# Patient Record
Sex: Female | Born: 1960 | Race: White | Hispanic: No | Marital: Married | Smoking: Former smoker
Health system: Southern US, Community
[De-identification: ages and names within clinical notes are randomized; demographics above are authoritative.]

## PROBLEM LIST (undated history)

## (undated) DIAGNOSIS — E785 Hyperlipidemia, unspecified: Secondary | ICD-10-CM

## (undated) DIAGNOSIS — Z789 Other specified health status: Secondary | ICD-10-CM

## (undated) DIAGNOSIS — I10 Essential (primary) hypertension: Secondary | ICD-10-CM

## (undated) DIAGNOSIS — F419 Anxiety disorder, unspecified: Secondary | ICD-10-CM

## (undated) DIAGNOSIS — D696 Thrombocytopenia, unspecified: Secondary | ICD-10-CM

## (undated) DIAGNOSIS — C801 Malignant (primary) neoplasm, unspecified: Secondary | ICD-10-CM

## (undated) DIAGNOSIS — E119 Type 2 diabetes mellitus without complications: Secondary | ICD-10-CM

## (undated) DIAGNOSIS — F32A Depression, unspecified: Secondary | ICD-10-CM

## (undated) HISTORY — PX: MASTECTOMY: SHX3

## (undated) HISTORY — DX: Hyperlipidemia, unspecified: E78.5

## (undated) HISTORY — PX: NO PAST SURGERIES: SHX2092

## (undated) HISTORY — PX: ABDOMINAL HYSTERECTOMY: SHX81

## (undated) HISTORY — DX: Malignant (primary) neoplasm, unspecified: C80.1

---

## 1998-03-31 DIAGNOSIS — Z8639 Personal history of other endocrine, nutritional and metabolic disease: Secondary | ICD-10-CM

## 1998-03-31 HISTORY — DX: Personal history of other endocrine, nutritional and metabolic disease: Z86.39

## 2009-03-23 ENCOUNTER — Emergency Department: Payer: Self-pay | Admitting: Emergency Medicine

## 2009-09-19 ENCOUNTER — Ambulatory Visit: Payer: Self-pay | Admitting: Family Medicine

## 2010-10-24 ENCOUNTER — Ambulatory Visit: Payer: Self-pay | Admitting: Family Medicine

## 2010-11-04 ENCOUNTER — Ambulatory Visit: Payer: Self-pay | Admitting: Family Medicine

## 2011-07-23 ENCOUNTER — Emergency Department: Payer: Self-pay

## 2012-01-08 ENCOUNTER — Ambulatory Visit: Payer: Self-pay | Admitting: Family Medicine

## 2013-01-28 ENCOUNTER — Emergency Department: Payer: Self-pay | Admitting: Emergency Medicine

## 2013-01-28 ENCOUNTER — Ambulatory Visit: Payer: Self-pay | Admitting: Family Medicine

## 2013-01-28 LAB — URINALYSIS, COMPLETE
Bacteria: NONE SEEN
Bilirubin,UR: NEGATIVE
Glucose,UR: NEGATIVE mg/dL (ref 0–75)
Hyaline Cast: 6
Ketone: NEGATIVE
Leukocyte Esterase: NEGATIVE
Nitrite: NEGATIVE
Protein: NEGATIVE

## 2013-01-28 LAB — HEPATIC FUNCTION PANEL A (ARMC)
Albumin: 3.6 g/dL (ref 3.4–5.0)
Alkaline Phosphatase: 79 U/L (ref 50–136)
Bilirubin, Direct: <0.1 mg/dL (ref 0.00–0.20)
SGOT(AST): 23 U/L (ref 15–37)
SGPT (ALT): 21 U/L (ref 12–78)
Total Protein: 7.1 g/dL (ref 6.4–8.2)

## 2013-01-28 LAB — BASIC METABOLIC PANEL
Anion Gap: 4 — ABNORMAL LOW (ref 7–16)
BUN: 10 mg/dL (ref 7–18)
Calcium, Total: 8.9 mg/dL (ref 8.5–10.1)
Chloride: 108 mmol/L — ABNORMAL HIGH (ref 98–107)
Creatinine: 0.75 mg/dL (ref 0.60–1.30)
EGFR (African American): >60
EGFR (Non-African Amer.): >60
Osmolality: 278 (ref 275–301)
Sodium: 139 mmol/L (ref 136–145)

## 2013-01-28 LAB — CBC
HCT: 38.9 % (ref 35.0–47.0)
HGB: 13.6 g/dL (ref 12.0–16.0)
MCHC: 34.9 g/dL (ref 32.0–36.0)
MCV: 91 fL (ref 80–100)
Platelet: 217 10*3/uL (ref 150–440)
RDW: 13 % (ref 11.5–14.5)
WBC: 8.1 10*3/uL (ref 3.6–11.0)

## 2013-10-07 ENCOUNTER — Emergency Department: Payer: Self-pay | Admitting: Emergency Medicine

## 2013-10-07 LAB — URINALYSIS, COMPLETE
BILIRUBIN, UR: NEGATIVE
Glucose,UR: NEGATIVE mg/dL (ref 0–75)
Nitrite: NEGATIVE
Ph: 5 (ref 4.5–8.0)
Protein: 30
RBC,UR: 48 /HPF (ref 0–5)
SPECIFIC GRAVITY: 1.025 (ref 1.003–1.030)
Squamous Epithelial: 8
WBC UR: 17 /HPF (ref 0–5)

## 2013-10-07 LAB — COMPREHENSIVE METABOLIC PANEL
AST: 16 U/L (ref 15–37)
Albumin: 3.6 g/dL (ref 3.4–5.0)
Alkaline Phosphatase: 67 U/L
Anion Gap: 7 (ref 7–16)
BILIRUBIN TOTAL: 0.4 mg/dL (ref 0.2–1.0)
BUN: 9 mg/dL (ref 7–18)
CALCIUM: 8.7 mg/dL (ref 8.5–10.1)
Chloride: 105 mmol/L (ref 98–107)
Co2: 29 mmol/L (ref 21–32)
Creatinine: 0.74 mg/dL (ref 0.60–1.30)
EGFR (African American): >60
EGFR (Non-African Amer.): >60
Glucose: 100 mg/dL — ABNORMAL HIGH (ref 65–99)
Osmolality: 280 (ref 275–301)
Potassium: 3.2 mmol/L — ABNORMAL LOW (ref 3.5–5.1)
SGPT (ALT): 17 U/L (ref 12–78)
Sodium: 141 mmol/L (ref 136–145)
Total Protein: 7.2 g/dL (ref 6.4–8.2)

## 2013-10-07 LAB — CBC
HCT: 40.2 % (ref 35.0–47.0)
HGB: 13.6 g/dL (ref 12.0–16.0)
MCH: 31.4 pg (ref 26.0–34.0)
MCHC: 33.9 g/dL (ref 32.0–36.0)
MCV: 93 fL (ref 80–100)
Platelet: 228 10*3/uL (ref 150–440)
RBC: 4.34 10*6/uL (ref 3.80–5.20)
RDW: 13.2 % (ref 11.5–14.5)
WBC: 9 10*3/uL (ref 3.6–11.0)

## 2015-03-24 ENCOUNTER — Inpatient Hospital Stay
Admission: EM | Admit: 2015-03-24 | Discharge: 2015-03-29 | DRG: 871 | Disposition: A | Discharge status: Home / Self Care | Payer: Self-pay | Attending: Internal Medicine | Admitting: Internal Medicine

## 2015-03-24 DIAGNOSIS — F061 Catatonic disorder due to known physiological condition: Secondary | ICD-10-CM

## 2015-03-24 DIAGNOSIS — E876 Hypokalemia: Secondary | ICD-10-CM | POA: Diagnosis not present

## 2015-03-24 DIAGNOSIS — F32A Depression, unspecified: Secondary | ICD-10-CM

## 2015-03-24 DIAGNOSIS — F202 Catatonic schizophrenia: Secondary | ICD-10-CM | POA: Diagnosis present

## 2015-03-24 DIAGNOSIS — Z79899 Other long term (current) drug therapy: Secondary | ICD-10-CM

## 2015-03-24 DIAGNOSIS — R4182 Altered mental status, unspecified: Secondary | ICD-10-CM

## 2015-03-24 DIAGNOSIS — E870 Hyperosmolality and hypernatremia: Secondary | ICD-10-CM

## 2015-03-24 DIAGNOSIS — F329 Major depressive disorder, single episode, unspecified: Secondary | ICD-10-CM

## 2015-03-24 DIAGNOSIS — R627 Adult failure to thrive: Secondary | ICD-10-CM | POA: Diagnosis present

## 2015-03-24 DIAGNOSIS — Z046 Encounter for general psychiatric examination, requested by authority: Secondary | ICD-10-CM

## 2015-03-24 DIAGNOSIS — F22 Delusional disorders: Secondary | ICD-10-CM | POA: Diagnosis not present

## 2015-03-24 DIAGNOSIS — N390 Urinary tract infection, site not specified: Secondary | ICD-10-CM

## 2015-03-24 DIAGNOSIS — E860 Dehydration: Secondary | ICD-10-CM

## 2015-03-24 DIAGNOSIS — F322 Major depressive disorder, single episode, severe without psychotic features: Secondary | ICD-10-CM | POA: Diagnosis present

## 2015-03-24 DIAGNOSIS — Z681 Body mass index (BMI) 19 or less, adult: Secondary | ICD-10-CM

## 2015-03-24 DIAGNOSIS — A419 Sepsis, unspecified organism: Principal | ICD-10-CM

## 2015-03-24 DIAGNOSIS — E43 Unspecified severe protein-calorie malnutrition: Secondary | ICD-10-CM | POA: Diagnosis present

## 2015-03-24 HISTORY — DX: Other specified health status: Z78.9

## 2015-03-24 LAB — CBC
HEMATOCRIT: 54.4 % — AB (ref 35.0–47.0)
HEMOGLOBIN: 17.1 g/dL — AB (ref 12.0–16.0)
MCH: 29.6 pg (ref 26.0–34.0)
MCHC: 31.5 g/dL — ABNORMAL LOW (ref 32.0–36.0)
MCV: 94 fL (ref 80.0–100.0)
Platelets: 168 10*3/uL (ref 150–440)
RBC: 5.79 MIL/uL — AB (ref 3.80–5.20)
RDW: 14.9 % — AB (ref 11.5–14.5)
WBC: 13.4 10*3/uL — AB (ref 3.6–11.0)

## 2015-03-24 NOTE — ED Notes (Signed)
Pt presents with [place] sherriff with IVC papers. Per police, pt's parents took IVC papers out on pt for "not wanting to eat, not taking care of herself, and using the bathroom on herself". Pt currently shakes head yes and no to repeated questions. Pt is soiled with feces and urine at this time. Poor hygeine noted. Pt moving all extremities, shakes head "no" when asked if she has pain. resps unlabored.

## 2015-03-25 ENCOUNTER — Emergency Department: Payer: Self-pay

## 2015-03-25 ENCOUNTER — Encounter: Payer: Self-pay | Admitting: Internal Medicine

## 2015-03-25 DIAGNOSIS — F329 Major depressive disorder, single episode, unspecified: Secondary | ICD-10-CM

## 2015-03-25 DIAGNOSIS — E860 Dehydration: Secondary | ICD-10-CM | POA: Diagnosis present

## 2015-03-25 LAB — TROPONIN I: Troponin I: <0.03 ng/mL (ref <0.031)

## 2015-03-25 LAB — BASIC METABOLIC PANEL
Anion gap: 13 (ref 5–15)
Anion gap: 8 (ref 5–15)
BUN: 24 mg/dL — ABNORMAL HIGH (ref 6–20)
BUN: 17 mg/dL (ref 6–20)
CALCIUM: 9.1 mg/dL (ref 8.9–10.3)
CHLORIDE: 112 mmol/L — AB (ref 101–111)
CO2: 28 mmol/L (ref 22–32)
CO2: 29 mmol/L (ref 22–32)
CREATININE: 0.63 mg/dL (ref 0.44–1.00)
Calcium: 9 mg/dL (ref 8.9–10.3)
Chloride: 114 mmol/L — ABNORMAL HIGH (ref 101–111)
Creatinine, Ser: 0.71 mg/dL (ref 0.44–1.00)
GFR calc Af Amer: >60 mL/min (ref >60)
GFR calc non Af Amer: >60 mL/min (ref >60)
GLUCOSE: 117 mg/dL — AB (ref 65–99)
Glucose, Bld: 122 mg/dL — ABNORMAL HIGH (ref 65–99)
POTASSIUM: 2.9 mmol/L — AB (ref 3.5–5.1)
POTASSIUM: 3.3 mmol/L — AB (ref 3.5–5.1)
Sodium: 155 mmol/L — ABNORMAL HIGH (ref 135–145)
Sodium: 149 mmol/L — ABNORMAL HIGH (ref 135–145)

## 2015-03-25 LAB — URINALYSIS COMPLETE WITH MICROSCOPIC (ARMC ONLY)
BILIRUBIN URINE: NEGATIVE
Glucose, UA: NEGATIVE mg/dL
Nitrite: NEGATIVE
PH: 6 (ref 5.0–8.0)
Protein, ur: 30 mg/dL — AB
SPECIFIC GRAVITY, URINE: 1.02 (ref 1.005–1.030)

## 2015-03-25 LAB — SALICYLATE LEVEL: Salicylate Lvl: <4 mg/dL (ref 2.8–30.0)

## 2015-03-25 LAB — URINE DRUG SCREEN, QUALITATIVE (ARMC ONLY)
Amphetamines, Ur Screen: NOT DETECTED
Barbiturates, Ur Screen: NOT DETECTED
Benzodiazepine, Ur Scrn: NOT DETECTED
COCAINE METABOLITE, UR ~~LOC~~: NOT DETECTED
Cannabinoid 50 Ng, Ur ~~LOC~~: NOT DETECTED
MDMA (ECSTASY) UR SCREEN: NOT DETECTED
METHADONE SCREEN, URINE: NOT DETECTED
OPIATE, UR SCREEN: NOT DETECTED
Phencyclidine (PCP) Ur S: NOT DETECTED
TRICYCLIC, UR SCREEN: NOT DETECTED

## 2015-03-25 LAB — COMPREHENSIVE METABOLIC PANEL
ALK PHOS: 48 U/L (ref 38–126)
ALT: 11 U/L — AB (ref 14–54)
AST: 22 U/L (ref 15–41)
Albumin: 4.8 g/dL (ref 3.5–5.0)
Anion gap: 21 — ABNORMAL HIGH (ref 5–15)
BILIRUBIN TOTAL: 2 mg/dL — AB (ref 0.3–1.2)
BUN: 31 mg/dL — AB (ref 6–20)
CO2: 22 mmol/L (ref 22–32)
CREATININE: 0.99 mg/dL (ref 0.44–1.00)
Calcium: 10.5 mg/dL — ABNORMAL HIGH (ref 8.9–10.3)
Chloride: 110 mmol/L (ref 101–111)
GFR calc Af Amer: >60 mL/min (ref >60)
GLUCOSE: 160 mg/dL — AB (ref 65–99)
Potassium: 3.5 mmol/L (ref 3.5–5.1)
Sodium: 153 mmol/L — ABNORMAL HIGH (ref 135–145)
TOTAL PROTEIN: 7.8 g/dL (ref 6.5–8.1)

## 2015-03-25 LAB — BLOOD GAS, ARTERIAL
ACID-BASE EXCESS: 0.7 mmol/L (ref 0.0–3.0)
BICARBONATE: 24.3 meq/L (ref 21.0–28.0)
FIO2: 0.21
O2 SAT: 96.6 %
PATIENT TEMPERATURE: 37
pCO2 arterial: 35 mmHg (ref 32.0–48.0)
pH, Arterial: 7.45 (ref 7.350–7.450)
pO2, Arterial: 83 mmHg (ref 83.0–108.0)

## 2015-03-25 LAB — CBC
HCT: 42.9 % (ref 35.0–47.0)
Hemoglobin: 14.1 g/dL (ref 12.0–16.0)
MCH: 30 pg (ref 26.0–34.0)
MCHC: 33 g/dL (ref 32.0–36.0)
MCV: 90.9 fL (ref 80.0–100.0)
Platelets: 106 10*3/uL — ABNORMAL LOW (ref 150–440)
RBC: 4.71 MIL/uL (ref 3.80–5.20)
RDW: 14.2 % (ref 11.5–14.5)
WBC: 12.9 10*3/uL — ABNORMAL HIGH (ref 3.6–11.0)

## 2015-03-25 LAB — SODIUM
Sodium: 151 mmol/L — ABNORMAL HIGH (ref 135–145)
Sodium: 147 mmol/L — ABNORMAL HIGH (ref 135–145)

## 2015-03-25 LAB — ACETAMINOPHEN LEVEL: Acetaminophen (Tylenol), Serum: <10 ug/mL — ABNORMAL LOW (ref 10–30)

## 2015-03-25 LAB — ETHANOL

## 2015-03-25 LAB — OSMOLALITY: OSMOLALITY: 336 mosm/kg — AB (ref 275–295)

## 2015-03-25 LAB — PHOSPHORUS
PHOSPHORUS: 2.1 mg/dL — AB (ref 2.5–4.6)
PHOSPHORUS: 2.1 mg/dL — AB (ref 2.5–4.6)

## 2015-03-25 LAB — LIPASE, BLOOD: LIPASE: 22 U/L (ref 11–51)

## 2015-03-25 LAB — MAGNESIUM
MAGNESIUM: 2.4 mg/dL (ref 1.7–2.4)
MAGNESIUM: 2.1 mg/dL (ref 1.7–2.4)

## 2015-03-25 LAB — TSH: TSH: 0.767 u[IU]/mL (ref 0.350–4.500)

## 2015-03-25 LAB — LACTIC ACID, PLASMA: LACTIC ACID, VENOUS: 1.9 mmol/L (ref 0.5–2.0)

## 2015-03-25 MED ORDER — ACETAMINOPHEN 650 MG RE SUPP: 650.0000 mg | Freq: Four times a day (QID) | RECTAL | Status: DC | PRN

## 2015-03-25 MED ORDER — ENOXAPARIN SODIUM 40 MG/0.4ML ~~LOC~~ SOLN
40.0000 mg | Freq: Every day | SUBCUTANEOUS | Status: DC
  Administered 2015-03-25: 40 mg via SUBCUTANEOUS
  Filled 2015-03-25 (×2): qty 0.4

## 2015-03-25 MED ORDER — POTASSIUM CHLORIDE 10 MEQ/100ML IV SOLN
10.0000 meq | INTRAVENOUS | Status: AC
  Administered 2015-03-25 (×4): 10 meq via INTRAVENOUS
  Filled 2015-03-25 (×5): qty 100

## 2015-03-25 MED ORDER — ACETAMINOPHEN 325 MG PO TABS: 650.0000 mg | ORAL_TABLET | Freq: Four times a day (QID) | ORAL | Status: DC | PRN

## 2015-03-25 MED ORDER — POTASSIUM CL IN DEXTROSE 5% 20 MEQ/L IV SOLN
20.0000 meq | INTRAVENOUS | Status: DC
  Administered 2015-03-25 (×2): 20 meq via INTRAVENOUS
  Filled 2015-03-25 (×4): qty 1000

## 2015-03-25 MED ORDER — FLUOXETINE HCL 20 MG PO CAPS
20.0000 mg | ORAL_CAPSULE | Freq: Every morning | ORAL | Status: DC
  Administered 2015-03-26: 20 mg via ORAL
  Filled 2015-03-25: qty 1

## 2015-03-25 MED ORDER — SODIUM CHLORIDE 0.45 % IV SOLN: INTRAVENOUS | Status: DC

## 2015-03-25 MED ORDER — SODIUM CHLORIDE 0.9 % IV SOLN
INTRAVENOUS | Status: DC
  Administered 2015-03-25: 06:00:00 via INTRAVENOUS

## 2015-03-25 MED ORDER — DEXTROSE 5 % IV SOLN
1.0000 g | INTRAVENOUS | Status: AC
  Administered 2015-03-25: 1 g via INTRAVENOUS
  Filled 2015-03-25: qty 10

## 2015-03-25 MED ORDER — SODIUM CHLORIDE 0.9 % IV BOLUS (SEPSIS)
1000.0000 mL | INTRAVENOUS | Status: AC
  Administered 2015-03-25: 1000 mL via INTRAVENOUS

## 2015-03-25 MED ORDER — DEXTROSE 5 % IV SOLN
1.0000 g | INTRAVENOUS | Status: DC
  Administered 2015-03-25 – 2015-03-28 (×4): 1 g via INTRAVENOUS
  Filled 2015-03-25 (×5): qty 10

## 2015-03-25 MED ORDER — SODIUM CHLORIDE 0.9 % IV SOLN
Freq: Once | INTRAVENOUS | Status: AC
  Administered 2015-03-25: 03:00:00 via INTRAVENOUS

## 2015-03-25 MED ORDER — ONDANSETRON HCL 4 MG PO TABS: 4.0000 mg | ORAL_TABLET | Freq: Four times a day (QID) | ORAL | Status: DC | PRN

## 2015-03-25 MED ORDER — OLANZAPINE 10 MG PO TABS
5.0000 mg | ORAL_TABLET | Freq: Every day | ORAL | Status: DC
  Filled 2015-03-25 (×3): qty 1

## 2015-03-25 MED ORDER — ONDANSETRON HCL 4 MG/2ML IJ SOLN: 4.0000 mg | Freq: Four times a day (QID) | INTRAMUSCULAR | Status: DC | PRN

## 2015-03-25 MED ORDER — ENSURE ENLIVE PO LIQD
237.0000 mL | Freq: Three times a day (TID) | ORAL | Status: DC
  Administered 2015-03-26 – 2015-03-29 (×6): 237 mL via ORAL

## 2015-03-25 MED ORDER — INFLUENZA VAC SPLIT QUAD 0.5 ML IM SUSY: 0.5000 mL | PREFILLED_SYRINGE | INTRAMUSCULAR | Status: DC

## 2015-03-25 NOTE — Progress Notes (Signed)
Pt admitted to 150, given bedbath , pt has not been caring for lherself since her sister died, has not been taking her medication can't afford them , care management consult entered ; pt and son was staying in homeless shelter then got in contact with uncle and pt and son went to live with pt mother who has dementia  dietition consult entered

## 2015-03-25 NOTE — Progress Notes (Signed)
[place] [facility] [place] NAME: Sharon Woods    MR#:  [id number]  DATE OF BIRTH:  [birth year]  SUBJECTIVE:  CHIEF COMPLAINT:   Chief Complaint  Patient presents with  . Psychiatric Evaluation   - Patient with severe depression, since sister's death. Adult failure to thrive. Not speaking at this time. Alert and following commands. -Severe electrolyte abnormalities.  REVIEW OF SYSTEMS:  Review of Systems  Unable to perform ROS: psychiatric disorder    DRUG ALLERGIES:  No Known Allergies  VITALS:  Blood pressure 140/74, pulse 69, temperature 98.6 F (37 C), temperature source Oral, resp. rate 18, height 5\' 4"  (1.626 m), weight 48.671 kg (107 lb 4.8 oz), SpO2 98 %.  PHYSICAL EXAMINATION:  Physical Exam  GENERAL:  54 y.o.-year-old patient lying in the bed with no acute distress. Appears alert. But choosing not to talk. Sitter at bedside. According to sitter patient speaking incomprehensible words only if she wants to talk. EYES: Pupils equal, round, reactive to light and accommodation. No scleral icterus. Extraocular muscles intact.  HEENT: Head atraumatic, normocephalic. Oropharynx and nasopharynx clear.  NECK:  Supple, no jugular venous distention. No thyroid enlargement, no tenderness.  LUNGS: Normal breath sounds bilaterally, no wheezing, rales,rhonchi or crepitation. No use of accessory muscles of respiration.  CARDIOVASCULAR: S1, S2 normal. No murmurs, rubs, or gallops.  ABDOMEN: Soft, nontender, nondistended. Scaphoid abdomen. Bowel sounds present. No organomegaly or mass.  EXTREMITIES: No pedal edema, cyanosis, or clubbing.  NEUROLOGIC: Cranial nerves II through XII are intact. Muscle strength 5/5 in all extremities. Generalized weakness noted. Sensation intact. Gait not checked.  PSYCHIATRIC: The patient is alert and following commands. Unable to test her orientation-she is not talking and not wanting to participate.Marland Kitchen  SKIN:  No obvious rash, lesion, or ulcer.    LABORATORY PANEL:   CBC  Recent Labs Lab 03/25/15 0437  WBC 12.9*  HGB 14.1  HCT 42.9  PLT 106*   ------------------------------------------------------------------------------------------------------------------  Chemistries   Recent Labs Lab 03/24/15 2326 03/25/15 0437  NA 153* 155*  K 3.5 2.9*  CL 110 114*  CO2 22 28  GLUCOSE 160* 117*  BUN 31* 24*  CREATININE 0.99 0.71  CALCIUM 10.5* 9.1  MG 2.4  --   AST 22  --   ALT 11*  --   ALKPHOS 48  --   BILITOT 2.0*  --    ------------------------------------------------------------------------------------------------------------------  Cardiac Enzymes  Recent Labs Lab 03/24/15 2326  TROPONINI <0.03   ------------------------------------------------------------------------------------------------------------------  RADIOLOGY:  Dg Chest Portable 1 View  03/25/2015  CLINICAL DATA:  Involuntary commitment. Admission chest radiograph. Initial encounter. EXAM: PORTABLE CHEST 1 VIEW COMPARISON:  None. FINDINGS: The lungs are well-aerated and clear. There is no evidence of focal opacification, pleural effusion or pneumothorax. The cardiomediastinal silhouette is within normal limits. No acute osseous abnormalities are seen. IMPRESSION: No acute cardiopulmonary process seen. Electronically Signed   By: Garald Balding M.D.   On: 03/25/2015 01:35    EKG:   Orders placed or performed during the hospital encounter of 03/24/15  . EKG 12-Lead  . EKG 12-Lead    ASSESSMENT AND PLAN:   54 year old female with unknown past medical history at this time presents to the hospital secondary to self mental left, poor by mouth intake and failure to thrive. Symptoms have started after her sister's death a few months ago.  #1 hypernatremia-secondary to free water deficit. Poor by mouth intake for several days now. -Since it's worsening  with half normal saline, changed to D5 fluids and check  sodium every 6 hours. -Once sodium is improving, hopefully her mental status will improve and then we'll change fluids to D5 half normal saline  #2 hypokalemia-replaced and recheck. Close monitoring needed. -Watch for refeeding syndrome. Check phosphorus and electrolytes every day.  #3 urinary tract infection- continue Rocephin for now.  #4 failure to thrive with self-neglect-possible depression since sister's death. -Psychiatric evaluation needed. -Continue sitter and suicide precautions for now -Involuntary commitment on family's request.  #5 DVT prophylaxis-on Lovenox     All the records are reviewed and case discussed with Care Management/Social Workerr. Management plans discussed with the patient, family and they are in agreement.  CODE STATUS: Full Code  TOTAL TIME TAKING CARE OF THIS PATIENT: 39 minutes.   POSSIBLE D/C IN 2-3 DAYS, DEPENDING ON CLINICAL CONDITION.   Jacarri Gesner M.D on 03/25/2015 at 8:00 AM  Between 7am to 6pm - Pager - [phone]  After 6pm go to [email or web address] - password [email or web address] [facility]  [facility]  Office  [phone]  CC: Primary care physician; No primary care provider on file.
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
## 2015-03-25 NOTE — Consult Note (Signed)
[facility] Consult   Reason for Consult:  Depression Referring Physician:  Ralph Leyden, M.D Patient Identification: Sharon Woods MRN:  [id number] Principal Diagnosis:  Major depressive disorder                                      Bereavement Diagnosis:   Patient Active Problem List   Diagnosis Date Noted  . Depressed [F32.9] 03/25/2015  . Hypernatremia [E87.0] 03/25/2015  . Dehydration [E86.0] 03/25/2015  . UTI (lower urinary tract infection) [N39.0] 03/25/2015    Total Time spent with patient: 1 hour  Subjective:   Sharon Woods is a 54 y.o. female patient admitted on involuntary commitment. Most of the history was obtained from the family and the emergency room provider as well as review of her chart.  HPI:   According to the records patient was brought in on involuntary commitment after the death of her  Sister as she has been  Progressively declining and has been neglecting herself. She was placed on involuntary commitment as she is unable to take care of herself. According to the family members, sister was the caretaker to the patient as well as to her mother. In the emergency room patient was found to be significantly dehydrated with a UTI and a sodium level of 153. During my interview patient did not answer any questions and was only nodding her head to some simple questions she answered yes to feeling depressed. She did not tell me how long ago the sister passed away. She did not tell me if she has been having any suicidal ideations at this time. Her sitter who has been monitoring her also reported that patient is not talking and is not eating  this time.  Patient is unable to contract for safety and is being monitored closely by the staff     Past Psychiatric History:  Patient does not have any previous psychiatric history.   Risk to Self: Is patient at risk for suicide?: No Risk to Others:   Prior Inpatient Therapy:   Prior Outpatient Therapy:     Past Medical History:  Past Medical History  Diagnosis Date  . Patient denies medical problems     Past Surgical History  Procedure Laterality Date  . No past surgeries     Family History:  Family History  Problem Relation Age of Onset  . Cancer    . Dementia     Family Psychiatric  History:unknown  Social History:  History  Alcohol Use No     History  Drug Use No    Social History   Social History  . Marital Status: Married    Spouse Name: N/A  . Number of Children: N/A  . Years of Education: N/A   Social History Main Topics  . Smoking status: Never Smoker   . Smokeless tobacco: None  . Alcohol Use: No  . Drug Use: No  . Sexual Activity: Not Asked   Other Topics Concern  . None   Social History Narrative  . None   Additional Social History:                          Allergies:  No Known Allergies  Labs:  Results for orders placed or performed during the hospital encounter of 03/24/15 (from the past 48 hour(s))  Osmolality     Status:  Abnormal   Collection Time: 03/24/15  1:30 AM  Result Value Ref Range   Osmolality 336 (HH) 275 - 295 mOsm/kg    Comment: CRITICAL RESULT CALLED TO, READ BACK BY AND VERIFIED WITH: DAWN TULLOCH AT 0331 ON 03/25/15 RWW   Comprehensive metabolic panel     Status: Abnormal   Collection Time: 03/24/15 11:26 PM  Result Value Ref Range   Sodium 153 (H) 135 - 145 mmol/L   Potassium 3.5 3.5 - 5.1 mmol/L   Chloride 110 101 - 111 mmol/L   CO2 22 22 - 32 mmol/L   Glucose, Bld 160 (H) 65 - 99 mg/dL   BUN 31 (H) 6 - 20 mg/dL   Creatinine, Ser 0.99 0.44 - 1.00 mg/dL   Calcium 10.5 (H) 8.9 - 10.3 mg/dL   Total Protein 7.8 6.5 - 8.1 g/dL   Albumin 4.8 3.5 - 5.0 g/dL   AST 22 15 - 41 U/L   ALT 11 (L) 14 - 54 U/L   Alkaline Phosphatase 48 38 - 126 U/L   Total Bilirubin 2.0 (H) 0.3 - 1.2 mg/dL   GFR calc non Af Amer >60 >60 mL/min   GFR calc Af Amer >60 >60 mL/min    Comment: (NOTE) The eGFR has been calculated  using the CKD EPI equation. This calculation has not been validated in all clinical situations. eGFR's persistently <60 mL/min signify possible Chronic Kidney Disease.    Anion gap 21 (H) 5 - 15  Ethanol (ETOH)     Status: None   Collection Time: 03/24/15 11:26 PM  Result Value Ref Range   Alcohol, Ethyl (B) <5 <5 mg/dL    Comment:        LOWEST DETECTABLE LIMIT FOR SERUM ALCOHOL IS 5 mg/dL FOR MEDICAL PURPOSES ONLY   Salicylate level     Status: None   Collection Time: 03/24/15 11:26 PM  Result Value Ref Range   Salicylate Lvl <9.3 2.8 - 30.0 mg/dL  Acetaminophen level     Status: Abnormal   Collection Time: 03/24/15 11:26 PM  Result Value Ref Range   Acetaminophen (Tylenol), Serum <10 (L) 10 - 30 ug/mL    Comment:        THERAPEUTIC CONCENTRATIONS VARY SIGNIFICANTLY. A RANGE OF 10-30 ug/mL MAY BE AN EFFECTIVE CONCENTRATION FOR MANY PATIENTS. HOWEVER, SOME ARE BEST TREATED AT CONCENTRATIONS OUTSIDE THIS RANGE. ACETAMINOPHEN CONCENTRATIONS >150 ug/mL AT 4 HOURS AFTER INGESTION AND >50 ug/mL AT 12 HOURS AFTER INGESTION ARE OFTEN ASSOCIATED WITH TOXIC REACTIONS.   CBC     Status: Abnormal   Collection Time: 03/24/15 11:26 PM  Result Value Ref Range   WBC 13.4 (H) 3.6 - 11.0 K/uL   RBC 5.79 (H) 3.80 - 5.20 MIL/uL   Hemoglobin 17.1 (H) 12.0 - 16.0 g/dL   HCT 54.4 (H) 35.0 - 47.0 %   MCV 94.0 80.0 - 100.0 fL   MCH 29.6 26.0 - 34.0 pg   MCHC 31.5 (L) 32.0 - 36.0 g/dL   RDW 14.9 (H) 11.5 - 14.5 %   Platelets 168 150 - 440 K/uL  Troponin I     Status: None   Collection Time: 03/24/15 11:26 PM  Result Value Ref Range   Troponin I <0.03 <0.031 ng/mL    Comment:        NO INDICATION OF MYOCARDIAL INJURY.   Magnesium     Status: None   Collection Time: 03/24/15 11:26 PM  Result Value Ref Range   Magnesium 2.4 1.7 -  2.4 mg/dL  Lipase, blood     Status: None   Collection Time: 03/24/15 11:26 PM  Result Value Ref Range   Lipase 22 11 - 51 U/L  Lactic acid, plasma      Status: None   Collection Time: 03/25/15  1:19 AM  Result Value Ref Range   Lactic Acid, Venous 1.9 0.5 - 2.0 mmol/L  Blood gas, arterial     Status: None   Collection Time: 03/25/15  1:20 AM  Result Value Ref Range   FIO2 0.21    Delivery systems ROOM AIR    pH, Arterial 7.45 7.350 - 7.450   pCO2 arterial 35 32.0 - 48.0 mmHg   pO2, Arterial 83 83.0 - 108.0 mmHg   Bicarbonate 24.3 21.0 - 28.0 mEq/L   Acid-Base Excess 0.7 0.0 - 3.0 mmol/L   O2 Saturation 96.6 %   Patient temperature 37.0    Collection site RIGHT RADIAL    Sample type ARTERIAL DRAW    Allens test (pass/fail) PASS PASS  Urinalysis complete, with microscopic ([facility] only)     Status: Abnormal   Collection Time: 03/25/15  1:31 AM  Result Value Ref Range   Color, Urine YELLOW (A) YELLOW   APPearance CLOUDY (A) CLEAR   Glucose, UA NEGATIVE NEGATIVE mg/dL   Bilirubin Urine NEGATIVE NEGATIVE   Ketones, ur 2+ (A) NEGATIVE mg/dL   Specific Gravity, Urine 1.020 1.005 - 1.030   Hgb urine dipstick 2+ (A) NEGATIVE   pH 6.0 5.0 - 8.0   Protein, ur 30 (A) NEGATIVE mg/dL   Nitrite NEGATIVE NEGATIVE   Leukocytes, UA 2+ (A) NEGATIVE   RBC / HPF 6-30 0 - 5 RBC/hpf   WBC, UA TOO NUMEROUS TO COUNT 0 - 5 WBC/hpf   Bacteria, UA RARE (A) NONE SEEN   Squamous Epithelial / LPF 6-30 (A) NONE SEEN   WBC Clumps PRESENT    Mucous PRESENT    Hyaline Casts, UA PRESENT   Urine Drug Screen, Qualitative ([facility] only)     Status: None   Collection Time: 03/25/15  1:31 AM  Result Value Ref Range   Tricyclic, Ur Screen NONE DETECTED NONE DETECTED   Amphetamines, Ur Screen NONE DETECTED NONE DETECTED   MDMA (Ecstasy)Ur Screen NONE DETECTED NONE DETECTED   Cocaine Metabolite,Ur [place] NONE DETECTED NONE DETECTED   Opiate, Ur Screen NONE DETECTED NONE DETECTED   Phencyclidine (PCP) Ur S NONE DETECTED NONE DETECTED   Cannabinoid 50 Ng, Ur [place] NONE DETECTED NONE DETECTED   Barbiturates, Ur Screen NONE DETECTED NONE DETECTED   Benzodiazepine, Ur Scrn  NONE DETECTED NONE DETECTED   Methadone Scn, Ur NONE DETECTED NONE DETECTED    Comment: (NOTE) 435  Tricyclics, urine               Cutoff 1000 ng/mL 200  Amphetamines, urine             Cutoff 1000 ng/mL 300  MDMA (Ecstasy), urine           Cutoff 500 ng/mL 400  Cocaine Metabolite, urine       Cutoff 300 ng/mL 500  Opiate, urine                   Cutoff 300 ng/mL 600  Phencyclidine (PCP), urine      Cutoff 25 ng/mL 700  Cannabinoid, urine              Cutoff 50 ng/mL 800  Barbiturates, urine  Cutoff 200 ng/mL 900  Benzodiazepine, urine           Cutoff 200 ng/mL 1000 Methadone, urine                Cutoff 300 ng/mL 1100 1200 The urine drug screen provides only a preliminary, unconfirmed 1300 analytical test result and should not be used for non-medical 1400 purposes. Clinical consideration and professional judgment should 1500 be applied to any positive drug screen result due to possible 1600 interfering substances. A more specific alternate chemical method 1700 must be used in order to obtain a confirmed analytical result.  1800 Gas chromato graphy / mass spectrometry (GC/MS) is the preferred 1900 confirmatory method.   CBC     Status: Abnormal   Collection Time: 03/25/15  4:37 AM  Result Value Ref Range   WBC 12.9 (H) 3.6 - 11.0 K/uL   RBC 4.71 3.80 - 5.20 MIL/uL   Hemoglobin 14.1 12.0 - 16.0 g/dL   HCT 42.9 35.0 - 47.0 %   MCV 90.9 80.0 - 100.0 fL   MCH 30.0 26.0 - 34.0 pg   MCHC 33.0 32.0 - 36.0 g/dL   RDW 14.2 11.5 - 14.5 %   Platelets 106 (L) 150 - 440 K/uL  Phosphorus     Status: Abnormal   Collection Time: 03/25/15  4:37 AM  Result Value Ref Range   Phosphorus 2.1 (L) 2.5 - 4.6 mg/dL  TSH     Status: None   Collection Time: 03/25/15  4:37 AM  Result Value Ref Range   TSH 0.767 0.350 - 4.500 uIU/mL  Basic metabolic panel     Status: Abnormal   Collection Time: 03/25/15  4:37 AM  Result Value Ref Range   Sodium 155 (H) 135 - 145 mmol/L   Potassium 2.9  (LL) 3.5 - 5.1 mmol/L    Comment: CRITICAL RESULT CALLED TO, READ BACK BY AND VERIFIED WITH BRENDA BECKER AT 0609 ON 03/25/15 RWW    Chloride 114 (H) 101 - 111 mmol/L   CO2 28 22 - 32 mmol/L   Glucose, Bld 117 (H) 65 - 99 mg/dL   BUN 24 (H) 6 - 20 mg/dL   Creatinine, Ser 0.71 0.44 - 1.00 mg/dL   Calcium 9.1 8.9 - 10.3 mg/dL   GFR calc non Af Amer >60 >60 mL/min   GFR calc Af Amer >60 >60 mL/min    Comment: (NOTE) The eGFR has been calculated using the CKD EPI equation. This calculation has not been validated in all clinical situations. eGFR's persistently <60 mL/min signify possible Chronic Kidney Disease.    Anion gap 13 5 - 15  Sodium     Status: Abnormal   Collection Time: 03/25/15 10:50 AM  Result Value Ref Range   Sodium 151 (H) 135 - 145 mmol/L  Phosphorus     Status: Abnormal   Collection Time: 03/25/15 10:50 AM  Result Value Ref Range   Phosphorus 2.1 (L) 2.5 - 4.6 mg/dL  Magnesium     Status: None   Collection Time: 03/25/15 10:50 AM  Result Value Ref Range   Magnesium 2.1 1.7 - 2.4 mg/dL  Basic metabolic panel     Status: Abnormal   Collection Time: 03/25/15 12:53 PM  Result Value Ref Range   Sodium 149 (H) 135 - 145 mmol/L   Potassium 3.3 (L) 3.5 - 5.1 mmol/L   Chloride 112 (H) 101 - 111 mmol/L   CO2 29 22 - 32 mmol/L   Glucose, Bld  122 (H) 65 - 99 mg/dL   BUN 17 6 - 20 mg/dL   Creatinine, Ser 0.63 0.44 - 1.00 mg/dL   Calcium 9.0 8.9 - 10.3 mg/dL   GFR calc non Af Amer >60 >60 mL/min   GFR calc Af Amer >60 >60 mL/min    Comment: (NOTE) The eGFR has been calculated using the CKD EPI equation. This calculation has not been validated in all clinical situations. eGFR's persistently <60 mL/min signify possible Chronic Kidney Disease.    Anion gap 8 5 - 15    Current Facility-Administered Medications  Medication Dose Route Frequency Provider Last Rate Last Dose  . acetaminophen (TYLENOL) tablet 650 mg  650 mg Oral Q6H PRN Lance Coon, MD       Or  .  acetaminophen (TYLENOL) suppository 650 mg  650 mg Rectal Q6H PRN Lance Coon, MD      . cefTRIAXone (ROCEPHIN) 1 g in dextrose 5 % 50 mL IVPB  1 g Intravenous Q24H Lance Coon, MD      . dextrose 5 % with KCl 20 mEq / L  infusion  20 mEq Intravenous Continuous Gladstone Lighter, MD 75 mL/hr at 03/25/15 0640 20 mEq at 03/25/15 0640  . enoxaparin (LOVENOX) injection 40 mg  40 mg Subcutaneous Daily Lance Coon, MD   40 mg at 03/25/15 0547  . feeding supplement (ENSURE ENLIVE) (ENSURE ENLIVE) liquid 237 mL  237 mL Oral TID BM Gladstone Lighter, MD   237 mL at 03/25/15 1000  . [START ON 03/26/2015] FLUoxetine (PROZAC) capsule 20 mg  20 mg Oral q morning - 10a Rainey Pines, MD      . Derrill Memo ON 03/26/2015] Influenza vac split quadrivalent PF (FLUARIX) injection 0.5 mL  0.5 mL Intramuscular Tomorrow-1000 Lance Coon, MD      . OLANZapine (ZYPREXA) tablet 5 mg  5 mg Oral QHS Rainey Pines, MD      . ondansetron (ZOFRAN) tablet 4 mg  4 mg Oral Q6H PRN Lance Coon, MD       Or  . ondansetron [facility]) injection 4 mg  4 mg Intravenous Q6H PRN Lance Coon, MD        Musculoskeletal: Strength & Muscle Tone: decreased Gait & Station: unable to stand Patient leans: N/A  Psychiatric Specialty Exam: Review of Systems  Psychiatric/Behavioral: Positive for depression. The patient is nervous/anxious.     Blood pressure 140/74, pulse 69, temperature 98.6 F (37 C), temperature source Oral, resp. rate 18, height 5' 4"  (1.626 m), weight 107 lb 4.8 oz (48.671 kg), SpO2 98 %.Body mass index is 18.41 kg/(m^2).  General Appearance: Disheveled  Eye Contact::  Minimal  Speech:  Slow  Volume:  Decreased  Mood:  Dysphoric  Affect:  Blunt  Thought Process:  Tangential  Orientation:  Full (Time, Place, and Person)  Thought Content:  WDL  Suicidal Thoughts:  No  Homicidal Thoughts:  No  Memory:  Immediate;   Fair  Judgement:  Impaired  Insight:  Lacking  Psychomotor Activity:  Psychomotor Retardation   Concentration:  Poor  Recall:  [place]  Language: Fair  Akathisia:  No  Handed:  Right  AIMS (if indicated):     Assets:  Social Support  ADL's:  Impaired  Cognition: Impaired,  Mild  Sleep:      Treatment Plan Summary: Medication management  Disposition: Recommend psychiatric Inpatient admission when medically cleared.   Patient will need psychiatric inpatient and she becomes medically stable   please  contact the psychiatry service events patient is medically stable for placement in the behavioral health unit I will start her on Prozac 20 mg in the morning and Zyprexa 5 mg at bedtime to help with her depressive symptoms Thank you for allowing me to participate in the care of this patient  Rainey Pines, MD 03/25/2015 3:01 PM

## 2015-03-25 NOTE — Progress Notes (Signed)
ANTIBIOTIC CONSULT NOTE - INITIAL  Pharmacy Consult for ceftriaxone Indication: UTI  No Known Allergies  Patient Measurements:   Adjusted Body Weight:   Vital Signs: Temp: 99 F (37.2 C) (12/24 2313) Temp Source: Oral (12/24 2313) BP: 140/87 mmHg (12/25 0200) Pulse Rate: 80 (12/25 0200) Intake/Output from previous day:   Intake/Output from this shift:    Labs:  Recent Labs  03/24/15 2326  WBC 13.4*  HGB 17.1*  PLT 168  CREATININE 0.99   CrCl cannot be calculated (Unknown ideal weight.). No results for input(s): VANCOTROUGH, VANCOPEAK, VANCORANDOM, GENTTROUGH, GENTPEAK, GENTRANDOM, TOBRATROUGH, TOBRAPEAK, TOBRARND, AMIKACINPEAK, AMIKACINTROU, AMIKACIN in the last 72 hours.   Microbiology: No results found for this or any previous visit (from the past 720 hour(s)).  Medical History: Past Medical History  Diagnosis Date  . Patient denies medical problems     Medications:  Infusions:  Assessment: 25 yof cc psychiatric evaluation, family reports she has been "declining." ED found pt significantly dehydrated and with likely UTI. Pharmacy consulted to dose ceftriaxone.  Goal of Therapy:    Plan:  Ceftriaxone 1 gm IV Q24H. Pharmacy will continue to follow.  Laural Benes, Pharm.D., BCPS Clinical Pharmacist 03/25/2015,3:25 AM

## 2015-03-25 NOTE — ED Notes (Signed)
Dr. Jannifer Franklin notified of critical lab value, Serum [place] - 336.  Dr. Aletha Halim acknowledged, no new orders.

## 2015-03-25 NOTE — H&P (Signed)
Sharon Woods NAME: Sharon Woods    MR#:  [id number]  DATE OF BIRTH:  10-11-1960  DATE OF ADMISSION:  03/24/2015  PRIMARY CARE PHYSICIAN: No primary care provider on file.   REQUESTING/REFERRING PHYSICIAN: Karma Greaser, M.D.  CHIEF COMPLAINT:   Chief Complaint  Patient presents with  . Psychiatric Evaluation    HISTORY OF PRESENT ILLNESS:  Sharon Woods  is a 54 y.o. female who presents with IVC. Is very difficult to obtain a history from the patient, so history is taken from the patient but also from collateral information from family and ED provider. Apparently patient was brought in by her family today as she has been "declining" recently. She has been taking very little by mouth intake and neglecting herself significantly. She is here today with a very depressed affect. She answers some questions with very minimally. Family reports that a few months ago her sister died, and apparently the sister was caregiver to both the patient's mother and the patient. Since that time the patient has been gradually declining. In the ED she was found to the significant dehydrated, has a likely UTI, and a sodium of 153. Hospitalists were called for medical admission with likely subsequent psychiatric admission.  PAST MEDICAL HISTORY:   Past Medical History  Diagnosis Date  . Patient denies medical problems     PAST SURGICAL HISTORY:   Past Surgical History  Procedure Laterality Date  . No past surgeries      SOCIAL HISTORY:   Social History  Substance Use Topics  . Smoking status: Never Smoker   . Smokeless tobacco: Not on file  . Alcohol Use: No    FAMILY HISTORY:   Family History  Problem Relation Age of Onset  . Cancer    . Dementia      DRUG ALLERGIES:  No Known Allergies  MEDICATIONS AT HOME:   Prior to Admission medications   Not on File    REVIEW OF SYSTEMS:  Review of Systems  Unable to perform ROS:  psychiatric disorder     VITAL SIGNS:   Filed Vitals:   03/24/15 2313  BP: 141/98  Pulse: 160  Temp: 99 F (37.2 C)  TempSrc: Oral  Resp: 20  SpO2: 96%   Wt Readings from Last 3 Encounters:  No data found for Wt    PHYSICAL EXAMINATION:  Physical Exam  Vitals reviewed. Constitutional: She appears well-developed and well-nourished. No distress.  HENT:  Head: Normocephalic and atraumatic.  Extremely dry mucosal membranes.  Eyes: Conjunctivae and EOM are normal. Pupils are equal, round, and reactive to light. No scleral icterus.  Neck: Normal range of motion. Neck supple. No JVD present. No thyromegaly present.  Cardiovascular: Normal rate, regular rhythm and intact distal pulses.  Exam reveals no gallop and no friction rub.   No murmur heard. Respiratory: Effort normal and breath sounds normal. No respiratory distress. She has no wheezes. She has no rales.  GI: Soft. Bowel sounds are normal. She exhibits no distension. There is no tenderness.  Musculoskeletal: Normal range of motion. She exhibits no edema.  No arthritis, no gout  Lymphadenopathy:    She has no cervical adenopathy.  Neurological: She is alert. No cranial nerve deficit.  No dysarthria, no aphasia  Skin: Skin is warm and dry. No rash noted. No erythema.  Psychiatric:  Very flat and depressed affect, very minimal verbal responses    LABORATORY PANEL:   CBC  Recent  Labs Lab 03/24/15 2326  WBC 13.4*  HGB 17.1*  HCT 54.4*  PLT 168   ------------------------------------------------------------------------------------------------------------------  Chemistries   Recent Labs Lab 03/24/15 2326  NA 153*  K 3.5  CL 110  CO2 22  GLUCOSE 160*  BUN 31*  CREATININE 0.99  CALCIUM 10.5*  MG 2.4  AST 22  ALT 11*  ALKPHOS 48  BILITOT 2.0*   ------------------------------------------------------------------------------------------------------------------  Cardiac Enzymes  Recent Labs Lab  03/24/15 2326  TROPONINI <0.03   ------------------------------------------------------------------------------------------------------------------  RADIOLOGY:  Dg Chest Portable 1 View  03/25/2015  CLINICAL DATA:  Involuntary commitment. Admission chest radiograph. Initial encounter. EXAM: PORTABLE CHEST 1 VIEW COMPARISON:  None. FINDINGS: The lungs are well-aerated and clear. There is no evidence of focal opacification, pleural effusion or pneumothorax. The cardiomediastinal silhouette is within normal limits. No acute osseous abnormalities are seen. IMPRESSION: No acute cardiopulmonary process seen. Electronically Signed   By: Garald Balding M.D.   On: 03/25/2015 01:35    EKG:   Orders placed or performed during the hospital encounter of 03/24/15  . EKG 12-Lead  . EKG 12-Lead    IMPRESSION AND PLAN:  Principal Problem:   Hypernatremia - almost certainly due to her severe dehydration from almost no by mouth intake at home. Free water deficit is very likely somewhere between 1-1.5 L. We will hydrate her at this time with normal saline, and then recheck her sodium value. If she remains persistently hypernatremic she will likely need a hypotonic fluid. Depending on what her initial sodium recheck looks like we'll order more frequent serial sodium labs. Active Problems:   UTI (lower urinary tract infection) - urine culture sent. IV Rocephin given, will continue this inpatient.   Depressed - unknown baseline. Psychiatry consult placed in the ED. We'll defer to their recommendations.   Dehydration - IV fluids as above.  All the records are reviewed and case discussed with ED provider. Management plans discussed with the patient and/or family.  DVT PROPHYLAXIS: SubQ lovenox  GI PROPHYLAXIS: None  ADMISSION STATUS: Inpatient  CODE STATUS: Full  TOTAL TIME TAKING CARE OF THIS PATIENT: 40 minutes.    [name] 03/25/2015, 2:26 AM  [facility]  Office   [phone]  CC: Primary care physician; No primary care provider on file.

## 2015-03-25 NOTE — ED Notes (Signed)
ENVIRONMENTAL ASSESSMENT  Potentially harmful objects out of patient reach: Yes.  Personal belongings secured: Yes.  Patient dressed in hospital provided attire only: Yes.  Plastic bags out of patient reach: Yes.  Patient care equipment (cords, cables, call bells, lines, and drains) shortened, removed, or accounted for: Yes.  Equipment and supplies removed from bottom of stretcher: Yes.  Potentially toxic materials out of patient reach: Yes.  Sharps container removed or out of patient reach: Yes.   BEHAVIORAL HEALTH ROUNDING  Patient sleeping: No.  Patient alert and oriented: alert but disoriented to place and time Behavior appropriate: No ; If no, describe: Not answering questions Nutrition and fluids offered: Yes  Toileting and hygiene offered: Yes  Sitter present: not applicable, Q 15 min safety rounds and observation.  Law enforcement present: Yes ODS

## 2015-03-25 NOTE — ED Notes (Signed)
Report to ann cales, rn.  

## 2015-03-25 NOTE — Progress Notes (Signed)
Notified dr Jannifer Franklin of critical pot 2.9 orders entered

## 2015-03-25 NOTE — ED Notes (Signed)
BEHAVIORAL HEALTH ROUNDING  Patient sleeping: No.  Patient alert and oriented: yes  Behavior appropriate: Yes. ; If no, describe:  Nutrition and fluids offered: Yes  Toileting and hygiene offered: Yes  Sitter present: not applicable, Q 15 min safety rounds and observation.  Law enforcement present: Yes ODS  

## 2015-03-26 DIAGNOSIS — F322 Major depressive disorder, single episode, severe without psychotic features: Secondary | ICD-10-CM

## 2015-03-26 DIAGNOSIS — E43 Unspecified severe protein-calorie malnutrition: Secondary | ICD-10-CM | POA: Insufficient documentation

## 2015-03-26 DIAGNOSIS — F061 Catatonic disorder due to known physiological condition: Secondary | ICD-10-CM

## 2015-03-26 LAB — BASIC METABOLIC PANEL
Anion gap: 9 (ref 5–15)
BUN: 10 mg/dL (ref 6–20)
CHLORIDE: 104 mmol/L (ref 101–111)
CO2: 29 mmol/L (ref 22–32)
CREATININE: 0.51 mg/dL (ref 0.44–1.00)
Calcium: 9.1 mg/dL (ref 8.9–10.3)
GFR calc Af Amer: >60 mL/min (ref >60)
GFR calc non Af Amer: >60 mL/min (ref >60)
GLUCOSE: 159 mg/dL — AB (ref 65–99)
Potassium: 3.1 mmol/L — ABNORMAL LOW (ref 3.5–5.1)
Sodium: 142 mmol/L (ref 135–145)

## 2015-03-26 LAB — URINE CULTURE
CULTURE: NO GROWTH
Special Requests: NORMAL

## 2015-03-26 LAB — PHOSPHORUS: Phosphorus: 2.1 mg/dL — ABNORMAL LOW (ref 2.5–4.6)

## 2015-03-26 LAB — MAGNESIUM: Magnesium: 1.7 mg/dL (ref 1.7–2.4)

## 2015-03-26 LAB — SODIUM: Sodium: 141 mmol/L (ref 135–145)

## 2015-03-26 MED ORDER — BENZOCAINE 20 % MT SOLN
Freq: Four times a day (QID) | OROMUCOSAL | Status: DC | PRN
  Filled 2015-03-26: qty 57

## 2015-03-26 MED ORDER — MIRTAZAPINE 15 MG PO TBDP
30.0000 mg | ORAL_TABLET | Freq: Every day | ORAL | Status: DC
  Filled 2015-03-26 (×2): qty 2

## 2015-03-26 MED ORDER — BENZOCAINE 10 % MT GEL
Freq: Four times a day (QID) | OROMUCOSAL | Status: DC | PRN
Start: 2015-03-26 — End: 2015-03-26
  Filled 2015-03-26: qty 9.4

## 2015-03-26 MED ORDER — K PHOS MONO-SOD PHOS DI & MONO 155-852-130 MG PO TABS
500.0000 mg | ORAL_TABLET | Freq: Once | ORAL | Status: DC
  Filled 2015-03-26: qty 2

## 2015-03-26 MED ORDER — DEXTROSE 5 % IV SOLN
40.0000 meq | Freq: Once | INTRAVENOUS | Status: AC
  Administered 2015-03-26: 40 meq via INTRAVENOUS
  Filled 2015-03-26: qty 9.09

## 2015-03-26 MED ORDER — SODIUM CHLORIDE 0.9 % IV SOLN
INTRAVENOUS | Status: DC
  Administered 2015-03-26: 08:00:00 via INTRAVENOUS

## 2015-03-26 NOTE — Progress Notes (Addendum)
Pt's son and brother came to visit.  Pt would still not brush her teeth even while they were with her.  She did let me look in her mouth.  Her back bottom molars on both sides are black.  The son said she has been using oral gel at home.  Dr Tressia Miners notified and ordering oral gel

## 2015-03-26 NOTE — Progress Notes (Signed)
Patient refused medication. Nursing interventions taking and patient education given. Attempted to give medication 3 times.Family, sitter at [facility] staff offer encouragement and reorientation to patient. Patient still declines.  Patient states " I'm just down got a lot on me" . Patient stops talking when ask what she is down about and how nursing staff can help.

## 2015-03-26 NOTE — Clinical Social Work Note (Signed)
Clinical Social Worker received consult for "commitments" Pt was admitted under IVC and has been evaluated by psychiatry. Pt will likely discharge to BMU when medically stable. CSW is available, if needed. CSW will continue to follow.   Darden Dates, MSW, LCSW Clinical Social Worker  [phone]

## 2015-03-26 NOTE — Progress Notes (Signed)
[facility] [place] NAME: Sharon Woods    MR#:  [id number]  DATE OF BIRTH:  May 18, 1960  SUBJECTIVE:  CHIEF COMPLAINT:   Chief Complaint  Patient presents with  . Psychiatric Evaluation   - Patient with severe depression, since sister's death.  - alert and mumbling a few words today. Refusing to take meds - says the IV hurts  REVIEW OF SYSTEMS:  Review of Systems  Unable to perform ROS: psychiatric disorder    DRUG ALLERGIES:  No Known Allergies  VITALS:  Blood pressure 129/78, pulse 78, temperature 97.8 F (36.6 C), temperature source Axillary, resp. rate 20, height 5\' 4"  (1.626 m), weight 48.671 kg (107 lb 4.8 oz), SpO2 98 %.  PHYSICAL EXAMINATION:  Physical Exam  GENERAL:  54 y.o.-year-old patient lying in the bed with no acute distress. Appears alert. Mumbling only a few words today. Sitter at bedside.  EYES: Pupils equal, round, reactive to light and accommodation. No scleral icterus. Extraocular muscles intact.  HEENT: Head atraumatic, normocephalic. Oropharynx and nasopharynx clear.  White coating on tongue, poor dentition NECK:  Supple, no jugular venous distention. No thyroid enlargement, no tenderness.  LUNGS: Normal breath sounds bilaterally, no wheezing, rales,rhonchi or crepitation. No use of accessory muscles of respiration.  CARDIOVASCULAR: S1, S2 normal. No murmurs, rubs, or gallops.  ABDOMEN: Soft, nontender, nondistended. Scaphoid abdomen. Bowel sounds present. No organomegaly or mass.  EXTREMITIES: No pedal edema, cyanosis, or clubbing.  NEUROLOGIC: Cranial nerves II through XII are intact. Muscle strength 5/5 in all extremities. Generalized weakness noted. Sensation intact. Gait not checked.  PSYCHIATRIC: The patient is alert and following commands. Unable to test her orientation-she is not talking and not wanting to participate.Marland Kitchen  SKIN: No obvious rash, lesion, or ulcer.    LABORATORY PANEL:    CBC  Recent Labs Lab 03/25/15 0437  WBC 12.9*  HGB 14.1  HCT 42.9  PLT 106*   ------------------------------------------------------------------------------------------------------------------  Chemistries   Recent Labs Lab 03/24/15 2326  03/26/15 0519 03/26/15 1110  NA 153*  < > 142 141  K 3.5  < > 3.1*  --   CL 110  < > 104  --   CO2 22  < > 29  --   GLUCOSE 160*  < > 159*  --   BUN 31*  < > 10  --   CREATININE 0.99  < > 0.51  --   CALCIUM 10.5*  < > 9.1  --   MG 2.4  < > 1.7  --   AST 22  --   --   --   ALT 11*  --   --   --   ALKPHOS 48  --   --   --   BILITOT 2.0*  --   --   --   < > = values in this interval not displayed. ------------------------------------------------------------------------------------------------------------------  Cardiac Enzymes  Recent Labs Lab 03/24/15 2326  TROPONINI <0.03   ------------------------------------------------------------------------------------------------------------------  RADIOLOGY:  Dg Chest Portable 1 View  03/25/2015  CLINICAL DATA:  Involuntary commitment. Admission chest radiograph. Initial encounter. EXAM: PORTABLE CHEST 1 VIEW COMPARISON:  None. FINDINGS: The lungs are well-aerated and clear. There is no evidence of focal opacification, pleural effusion or pneumothorax. The cardiomediastinal silhouette is within normal limits. No acute osseous abnormalities are seen. IMPRESSION: No acute cardiopulmonary process seen. Electronically Signed   By: Garald Balding M.D.   On: 03/25/2015 01:35    EKG:  Orders placed or performed during the hospital encounter of 03/24/15  . EKG 12-Lead  . EKG 12-Lead    ASSESSMENT AND PLAN:   54 year old female with unknown past medical history at this time presents to the hospital secondary to self mental left, poor by mouth intake and failure to thrive. Symptoms have started after her sister's death a few months ago.  #1 hypernatremia-secondary to free water deficit.  Poor by mouth intake for several days now. -Has been corrected with D5W. Change fluids to normal saline  #2 hypokalemia-replaced. Close monitoring needed. -Watch for refeeding syndrome. Also low phosphorus has been corrected. -Patient has been refusing to take oral medications.  #3 urinary tract infection- continue Rocephin for now.  #4 failure to thrive with self-neglect-possible depression since sister's death. -Psychiatric evaluation needed. We will need inpatient psychiatric admission -Continue sitter and suicide precautions for now -Involuntary commitment on family's request. - Dr. Weber Cooks to follow patient today. Was started on prozac and zyprexa by the weekend psychiatrist  #5 DVT prophylaxis-on Lovenox - patient refusing to take    All the records are reviewed and case discussed with Care Management/Social Workerr. Management plans discussed with the patient, family and they are in agreement.  CODE STATUS: Full Code  TOTAL TIME TAKING CARE OF THIS PATIENT: 39 minutes.   POSSIBLE D/C IN 2-3 DAYS, DEPENDING ON CLINICAL CONDITION.   Gladstone Lighter M.D on 03/26/2015 at 2:24 PM  Between 7am to 6pm - Pager - [phone]  After 6pm go to [email or web address] - password [email or web address] [facility]  [facility]  Office  [phone]  CC: Primary care physician; No primary care provider on file.

## 2015-03-26 NOTE — Care Management Note (Addendum)
Case Management Note  Patient Details  Name: Sharon Woods MRN: [id number] Date of Birth: [birth year]  Subjective/Objective:   Chart reviewed. Patient evaluated by psychiatry due to major depression, bereavement and decline. For psychiatric placement once medically stable. Patient withdrawn and nonverbal at time of assessment. Admitted with hypernatremia, dehydration and UTI.  Receiving IV antibiotics, IVF,  NA 142 today .               Action/Plan: Following progression  Expected Discharge Date:  03/28/15               Expected Discharge Plan:     In-House Referral:     Discharge planning Services     Post Acute Care Choice:    Choice offered to:     DME Arranged:    DME Agency:     HH Arranged:    [place] Agency:     Status of Service:     Medicare Important Message Given:    Date Medicare IM Given:    Medicare IM give by:    Date Additional Medicare IM Given:    Additional Medicare Important Message give by:     If discussed at [place] of Stay Meetings, dates discussed:    Additional Comments:  Jolly Mango, RN 03/26/2015, 11:18 AM

## 2015-03-26 NOTE — Progress Notes (Addendum)
Initial Nutrition Assessment  DOCUMENTATION CODES:   Severe malnutrition in context of social or environmental circumstances  INTERVENTION:   Meals and Snacks: Cater to patient preferences; will send homemade milkshakes on dinner trays. Medical Food Supplement Therapy: Agree with Ensure Enlive po TID, each supplement provides 350 kcal and 20 grams of protein, between meals.    NUTRITION DIAGNOSIS:   Malnutrition related to social / environmental circumstances as evidenced by severe depletion of muscle mass, severe depletion of body fat, energy intake < 75% for > or equal to 3 months, percent weight loss.  GOAL:   Patient will meet greater than or equal to 90% of their needs  MONITOR:    (Energy Intake, Anthropometrics, Digestive system, Electrolyte and Renal Profile)  REASON FOR ASSESSMENT:      (Wt loss, poor po intake)  ASSESSMENT:   Pt admitted IVC with hypernatremia and dehydration. Pt sister passed away months ago. Pt with very poor po intake. Behavioral/psych MD consulted.  Past Medical History  Diagnosis Date  . Patient denies medical problems      Diet Order:  Diet regular Room service appropriate?: Yes; Fluid consistency:: Thin    Current Nutrition: Per sitter, [facility] pt refused breakfast this am. Pt had Ensure in her hands on visit, had had one sip.   Food/Nutrition-Related History: Pt reports not wanting to eat PTA 'because of how I feel.' Pt reports not wanting to eat for 'a long while' PTA > 1 month, pt nodded longer than one month. Per MD note, after pt's sister passed away a few months ago, pt started to decline and per RN note at one time living in a homeless shelter but then moved in with uncle. Pt not very talkative on visit but would answer with one word or small sentences.    Scheduled Medications:  . cefTRIAXone (ROCEPHIN)  IV  1 g Intravenous Q24H  . enoxaparin (LOVENOX) injection  40 mg Subcutaneous Daily  . feeding supplement (ENSURE  ENLIVE)  237 mL Oral TID BM  . FLUoxetine  20 mg Oral q morning - 10a  . Influenza vac split quadrivalent PF  0.5 mL Intramuscular Tomorrow-1000  . OLANZapine  5 mg Oral QHS  . phosphorus  500 mg Oral Once    Continuous Medications:  . sodium chloride 75 mL/hr at 03/26/15 1224     Electrolyte/Renal Profile and Glucose Profile:   Recent Labs Lab 03/24/15 2326 03/25/15 0437 03/25/15 1050 03/25/15 1253 03/25/15 1739 03/26/15 0519 03/26/15 1110  NA 153* 155* 151* 149* 147* 142 141  K 3.5 2.9*  --  3.3*  --  3.1*  --   CL 110 114*  --  112*  --  104  --   CO2 22 28  --  29  --  29  --   BUN 31* 24*  --  17  --  10  --   CREATININE 0.99 0.71  --  0.63  --  0.51  --   CALCIUM 10.5* 9.1  --  9.0  --  9.1  --   MG 2.4  --  2.1  --   --  1.7  --   PHOS  --  2.1* 2.1*  --   --  2.1*  --   GLUCOSE 160* 117*  --  122*  --  159*  --    Protein Profile:   Recent Labs Lab 03/24/15 2326  ALBUMIN 4.8    Gastrointestinal Profile: Last BM:  03/25/2015  Nutrition-Focused Physical Exam Findings: Nutrition-Focused physical exam completed. Findings are mild-severe fat depletion, moderate-severe muscle depletion, and no edema.     Weight Change: Difficult to clarify with pt. Per MST pt with greater than 34lbs weight loss (24% weight loss if 34lbs lost) per RN consult 50lbs weight loss since August (32% weight loss in 4 months)    Height:   Ht Readings from Last 1 Encounters:  03/25/15 5\' 4"  (1.626 m)    Weight:   Wt Readings from Last 1 Encounters:  03/25/15 107 lb 4.8 oz (48.671 kg)     BMI:  Body mass index is 18.41 kg/(m^2).  Estimated Nutritional Needs:   Kcal:  BEE: 1077kcals, TEE: (IF 1.1-1.3)(AF 1.3) 1540-1820kcals  Protein:  48-58g protein (1.0-1.2g/kg)  Fluid:  1461-1754mL of fluid (25-73mL/kg)  EDUCATION NEEDS:   No education needs identified at this time   [place], RD, LDN Pager [phone] Weekend/On-Call Pager [phone]

## 2015-03-26 NOTE — Consult Note (Signed)
[facility] Face-to-Face Psychiatry Consult   Reason for Consult:  Consult for this 54 year old woman brought to the hospital by family who noticed that she had stopped eating and was losing a great deal of weight. Referring Physician:  Tressia Miners Patient Identification: Sharon Woods MRN:  [id number] Principal Diagnosis: Depression, major, single episode, severe ([place]) Diagnosis:   Patient Active Problem List   Diagnosis Date Noted  . Protein-calorie malnutrition, severe [E43] 03/26/2015  . Depression, major, single episode, severe ([place]) [F32.2] 03/26/2015  . 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 Depressed [F32.9] 03/25/2015  . Hypernatremia [E87.0] 03/25/2015  . Dehydration [E86.0] 03/25/2015  . UTI (lower urinary tract infection) [N39.0] 03/25/2015    Total Time spent with patient: 1 hour  Subjective:   Sharon Woods is a 54 y.o. female patient admitted with patient speaks very little to me and did not make any subjective complaint. The little that she did say was whispered and was usually single words in response to direct questions.  HPI:  Information from the patient and the chart. Patient interviewed. Chart reviewed. Family in the form of her son and brother also interviewed. Case discussed with hospitalist. This 54 year old woman without a known past psychiatric history is described by her family is getting increasingly more withdrawn and appearing more depressed probably for the last few months. She has had a series of major stresses. While living in [place] with her son they had been reduced by circumstances to living in a tent briefly. This resulted in social services taking a grandson who was staying with him into custody. They were also having serious problems trying to help another elderly relative. They moved back here to [place] about 2 months ago and were temporarily homeless and staying in the shelter. They have now been able to find tastes somewhat stable place to live  thanks to the patient's brother. However, they also discovered during the last couple months that the patient's sister had died. This seems to be the event that really pushed her over. Evidently she has eaten almost nothing most days for over a month. Patient is only partially able to give reliable answers to questions but she indicates that she has been sleeping poorly. She denies that she has any wish to die. She did not give me a direct answer when I ask her about hallucinations or psychotic symptoms. She is not currently getting any kind of mental health treatment. She appears to of lost a large amount of weight and has protein malnourishment. She also was found to have a urinary tract infection on admission but this is being treated.  Social history: Patient and her son are living at her near severe poverty. Son is evidently disabled but the patient does not get any support or have any insurance. He appears however to at least be devoted to and making an effort to try to help her.  Medical history: The son volunteers that she had been diagnosed with high blood pressure in the past but never took medication or state in any kind of treatment. She has seen Dr. Janae Bridgeman in the past but does not think of herself as having a regular doctor. No other known medical history.  Substance abuse history: Denies any current or past history of alcohol or drug abuse.    Past Psychiatric History: Patient and son and brother deny knowing of any past history of psychiatric illness or treatment. No history of psychiatric hospitalization. No history of suicide attempts. Never apparently been on  any medication for mental health problems  Risk to Self: Is patient at risk for suicide?: No Risk to Others:   Prior Inpatient Therapy:   Prior Outpatient Therapy:    Past Medical History:  Past Medical History  Diagnosis Date  . Patient denies medical problems     Past Surgical History  Procedure Laterality Date  .  No past surgeries     Family History:  Family History  Problem Relation Age of Onset  . Cancer    . Dementia     Family Psychiatric  History: It is reported that the sister who passed away a couple months ago had some kind of mental health problems but the details aren't known to anyone and there is no other known mental health history in the family Social History:  History  Alcohol Use No     History  Drug Use No    Social History   Social History  . Marital Status: Married    Spouse Name: N/A  . Number of Children: N/A  . Years of Education: N/A   Social History Main Topics  . Smoking status: Never Smoker   . Smokeless tobacco: None  . Alcohol Use: No  . Drug Use: No  . Sexual Activity: Not Asked   Other Topics Concern  . None   Social History Narrative  . None   Additional Social History:                          Allergies:  No Known Allergies  Labs:  Results for orders placed or performed during the hospital encounter of 03/24/15 (from the past 48 hour(s))  Comprehensive metabolic panel     Status: Abnormal   Collection Time: 03/24/15 11:26 PM  Result Value Ref Range   Sodium 153 (H) 135 - 145 mmol/L   Potassium 3.5 3.5 - 5.1 mmol/L   Chloride 110 101 - 111 mmol/L   CO2 22 22 - 32 mmol/L   Glucose, Bld 160 (H) 65 - 99 mg/dL   BUN 31 (H) 6 - 20 mg/dL   Creatinine, Ser 0.99 0.44 - 1.00 mg/dL   Calcium 10.5 (H) 8.9 - 10.3 mg/dL   Total Protein 7.8 6.5 - 8.1 g/dL   Albumin 4.8 3.5 - 5.0 g/dL   AST 22 15 - 41 U/L   ALT 11 (L) 14 - 54 U/L   Alkaline Phosphatase 48 38 - 126 U/L   Total Bilirubin 2.0 (H) 0.3 - 1.2 mg/dL   GFR calc non Af Amer >60 >60 mL/min   GFR calc Af Amer >60 >60 mL/min    Comment: (NOTE) The eGFR has been calculated using the CKD EPI equation. This calculation has not been validated in all clinical situations. eGFR's persistently <60 mL/min signify possible Chronic Kidney Disease.    Anion gap 21 (H) 5 - 15  Ethanol  (ETOH)     Status: None   Collection Time: 03/24/15 11:26 PM  Result Value Ref Range   Alcohol, Ethyl (B) <5 <5 mg/dL    Comment:        LOWEST DETECTABLE LIMIT FOR SERUM ALCOHOL IS 5 mg/dL FOR MEDICAL PURPOSES ONLY   Salicylate level     Status: None   Collection Time: 03/24/15 11:26 PM  Result Value Ref Range   Salicylate Lvl <5.8 2.8 - 30.0 mg/dL  Acetaminophen level     Status: Abnormal   Collection Time: 03/24/15 11:26 PM  Result Value Ref Range   Acetaminophen (Tylenol), Serum <10 (L) 10 - 30 ug/mL    Comment:        THERAPEUTIC CONCENTRATIONS VARY SIGNIFICANTLY. A RANGE OF 10-30 ug/mL MAY BE AN EFFECTIVE CONCENTRATION FOR MANY PATIENTS. HOWEVER, SOME ARE BEST TREATED AT CONCENTRATIONS OUTSIDE THIS RANGE. ACETAMINOPHEN CONCENTRATIONS >150 ug/mL AT 4 HOURS AFTER INGESTION AND >50 ug/mL AT 12 HOURS AFTER INGESTION ARE OFTEN ASSOCIATED WITH TOXIC REACTIONS.   CBC     Status: Abnormal   Collection Time: 03/24/15 11:26 PM  Result Value Ref Range   WBC 13.4 (H) 3.6 - 11.0 K/uL   RBC 5.79 (H) 3.80 - 5.20 MIL/uL   Hemoglobin 17.1 (H) 12.0 - 16.0 g/dL   HCT 54.4 (H) 35.0 - 47.0 %   MCV 94.0 80.0 - 100.0 fL   MCH 29.6 26.0 - 34.0 pg   MCHC 31.5 (L) 32.0 - 36.0 g/dL   RDW 14.9 (H) 11.5 - 14.5 %   Platelets 168 150 - 440 K/uL  Troponin I     Status: None   Collection Time: 03/24/15 11:26 PM  Result Value Ref Range   Troponin I <0.03 <0.031 ng/mL    Comment:        NO INDICATION OF MYOCARDIAL INJURY.   Magnesium     Status: None   Collection Time: 03/24/15 11:26 PM  Result Value Ref Range   Magnesium 2.4 1.7 - 2.4 mg/dL  Lipase, blood     Status: None   Collection Time: 03/24/15 11:26 PM  Result Value Ref Range   Lipase 22 11 - 51 U/L  Lactic acid, plasma     Status: None   Collection Time: 03/25/15  1:19 AM  Result Value Ref Range   Lactic Acid, Venous 1.9 0.5 - 2.0 mmol/L  Blood gas, arterial     Status: None   Collection Time: 03/25/15  1:20 AM  Result  Value Ref Range   FIO2 0.21    Delivery systems ROOM AIR    pH, Arterial 7.45 7.350 - 7.450   pCO2 arterial 35 32.0 - 48.0 mmHg   pO2, Arterial 83 83.0 - 108.0 mmHg   Bicarbonate 24.3 21.0 - 28.0 mEq/L   Acid-Base Excess 0.7 0.0 - 3.0 mmol/L   O2 Saturation 96.6 %   Patient temperature 37.0    Collection site RIGHT RADIAL    Sample type ARTERIAL DRAW    Allens test (pass/fail) PASS PASS  Urinalysis complete, with microscopic ([facility] only)     Status: Abnormal   Collection Time: 03/25/15  1:31 AM  Result Value Ref Range   Color, Urine YELLOW (A) YELLOW   APPearance CLOUDY (A) CLEAR   Glucose, UA NEGATIVE NEGATIVE mg/dL   Bilirubin Urine NEGATIVE NEGATIVE   Ketones, ur 2+ (A) NEGATIVE mg/dL   Specific Gravity, Urine 1.020 1.005 - 1.030   Hgb urine dipstick 2+ (A) NEGATIVE   pH 6.0 5.0 - 8.0   Protein, ur 30 (A) NEGATIVE mg/dL   Nitrite NEGATIVE NEGATIVE   Leukocytes, UA 2+ (A) NEGATIVE   RBC / HPF 6-30 0 - 5 RBC/hpf   WBC, UA TOO NUMEROUS TO COUNT 0 - 5 WBC/hpf   Bacteria, UA RARE (A) NONE SEEN   Squamous Epithelial / LPF 6-30 (A) NONE SEEN   WBC Clumps PRESENT    Mucous PRESENT    Hyaline Casts, UA PRESENT   Urine Drug Screen, Qualitative ([facility] only)     Status: None   Collection  Time: 03/25/15  1:31 AM  Result Value Ref Range   Tricyclic, Ur Screen NONE DETECTED NONE DETECTED   Amphetamines, Ur Screen NONE DETECTED NONE DETECTED   MDMA (Ecstasy)Ur Screen NONE DETECTED NONE DETECTED   Cocaine Metabolite,Ur [place] NONE DETECTED NONE DETECTED   Opiate, Ur Screen NONE DETECTED NONE DETECTED   Phencyclidine (PCP) Ur S NONE DETECTED NONE DETECTED   Cannabinoid 50 Ng, Ur [place] NONE DETECTED NONE DETECTED   Barbiturates, Ur Screen NONE DETECTED NONE DETECTED   Benzodiazepine, Ur Scrn NONE DETECTED NONE DETECTED   Methadone Scn, Ur NONE DETECTED NONE DETECTED    Comment: (NOTE) 300  Tricyclics, urine               Cutoff 1000 ng/mL 200  Amphetamines, urine             Cutoff 1000  ng/mL 300  MDMA (Ecstasy), urine           Cutoff 500 ng/mL 400  Cocaine Metabolite, urine       Cutoff 300 ng/mL 500  Opiate, urine                   Cutoff 300 ng/mL 600  Phencyclidine (PCP), urine      Cutoff 25 ng/mL 700  Cannabinoid, urine              Cutoff 50 ng/mL 800  Barbiturates, urine             Cutoff 200 ng/mL 900  Benzodiazepine, urine           Cutoff 200 ng/mL 1000 Methadone, urine                Cutoff 300 ng/mL 1100 1200 The urine drug screen provides only a preliminary, unconfirmed 1300 analytical test result and should not be used for non-medical 1400 purposes. Clinical consideration and professional judgment should 1500 be applied to any positive drug screen result due to possible 1600 interfering substances. A more specific alternate chemical method 1700 must be used in order to obtain a confirmed analytical result.  1800 Gas chromato graphy / mass spectrometry (GC/MS) is the preferred 1900 confirmatory method.   Urine culture     Status: None   Collection Time: 03/25/15  1:52 AM  Result Value Ref Range   Specimen Description URINE, RANDOM    Special Requests Normal    Culture NO GROWTH 1 DAY    Report Status 03/26/2015 FINAL   CBC     Status: Abnormal   Collection Time: 03/25/15  4:37 AM  Result Value Ref Range   WBC 12.9 (H) 3.6 - 11.0 K/uL   RBC 4.71 3.80 - 5.20 MIL/uL   Hemoglobin 14.1 12.0 - 16.0 g/dL   HCT 42.9 35.0 - 47.0 %   MCV 90.9 80.0 - 100.0 fL   MCH 30.0 26.0 - 34.0 pg   MCHC 33.0 32.0 - 36.0 g/dL   RDW 14.2 11.5 - 14.5 %   Platelets 106 (L) 150 - 440 K/uL  Phosphorus     Status: Abnormal   Collection Time: 03/25/15  4:37 AM  Result Value Ref Range   Phosphorus 2.1 (L) 2.5 - 4.6 mg/dL  TSH     Status: None   Collection Time: 03/25/15  4:37 AM  Result Value Ref Range   TSH 0.767 0.350 - 4.500 uIU/mL  Basic metabolic panel     Status: Abnormal   Collection Time: 03/25/15  4:37 AM  Result Value  Ref Range   Sodium 155 (H) 135 - 145  mmol/L   Potassium 2.9 (LL) 3.5 - 5.1 mmol/L    Comment: CRITICAL RESULT CALLED TO, READ BACK BY AND VERIFIED WITH BRENDA BECKER AT 0609 ON 03/25/15 RWW    Chloride 114 (H) 101 - 111 mmol/L   CO2 28 22 - 32 mmol/L   Glucose, Bld 117 (H) 65 - 99 mg/dL   BUN 24 (H) 6 - 20 mg/dL   Creatinine, Ser 0.71 0.44 - 1.00 mg/dL   Calcium 9.1 8.9 - 10.3 mg/dL   GFR calc non Af Amer >60 >60 mL/min   GFR calc Af Amer >60 >60 mL/min    Comment: (NOTE) The eGFR has been calculated using the CKD EPI equation. This calculation has not been validated in all clinical situations. eGFR's persistently <60 mL/min signify possible Chronic Kidney Disease.    Anion gap 13 5 - 15  Sodium     Status: Abnormal   Collection Time: 03/25/15 10:50 AM  Result Value Ref Range   Sodium 151 (H) 135 - 145 mmol/L  Phosphorus     Status: Abnormal   Collection Time: 03/25/15 10:50 AM  Result Value Ref Range   Phosphorus 2.1 (L) 2.5 - 4.6 mg/dL  Magnesium     Status: None   Collection Time: 03/25/15 10:50 AM  Result Value Ref Range   Magnesium 2.1 1.7 - 2.4 mg/dL  Basic metabolic panel     Status: Abnormal   Collection Time: 03/25/15 12:53 PM  Result Value Ref Range   Sodium 149 (H) 135 - 145 mmol/L   Potassium 3.3 (L) 3.5 - 5.1 mmol/L   Chloride 112 (H) 101 - 111 mmol/L   CO2 29 22 - 32 mmol/L   Glucose, Bld 122 (H) 65 - 99 mg/dL   BUN 17 6 - 20 mg/dL   Creatinine, Ser 0.63 0.44 - 1.00 mg/dL   Calcium 9.0 8.9 - 10.3 mg/dL   GFR calc non Af Amer >60 >60 mL/min   GFR calc Af Amer >60 >60 mL/min    Comment: (NOTE) The eGFR has been calculated using the CKD EPI equation. This calculation has not been validated in all clinical situations. eGFR's persistently <60 mL/min signify possible Chronic Kidney Disease.    Anion gap 8 5 - 15  Sodium     Status: Abnormal   Collection Time: 03/25/15  5:39 PM  Result Value Ref Range   Sodium 147 (H) 135 - 145 mmol/L  Phosphorus     Status: Abnormal   Collection Time:  03/26/15  5:19 AM  Result Value Ref Range   Phosphorus 2.1 (L) 2.5 - 4.6 mg/dL  Magnesium     Status: None   Collection Time: 03/26/15  5:19 AM  Result Value Ref Range   Magnesium 1.7 1.7 - 2.4 mg/dL  Basic metabolic panel     Status: Abnormal   Collection Time: 03/26/15  5:19 AM  Result Value Ref Range   Sodium 142 135 - 145 mmol/L   Potassium 3.1 (L) 3.5 - 5.1 mmol/L   Chloride 104 101 - 111 mmol/L   CO2 29 22 - 32 mmol/L   Glucose, Bld 159 (H) 65 - 99 mg/dL   BUN 10 6 - 20 mg/dL   Creatinine, Ser 0.51 0.44 - 1.00 mg/dL   Calcium 9.1 8.9 - 10.3 mg/dL   GFR calc non Af Amer >60 >60 mL/min   GFR calc Af Amer >60 >60 mL/min  Comment: (NOTE) The eGFR has been calculated using the CKD EPI equation. This calculation has not been validated in all clinical situations. eGFR's persistently <60 mL/min signify possible Chronic Kidney Disease.    Anion gap 9 5 - 15  Sodium     Status: None   Collection Time: 03/26/15 11:10 AM  Result Value Ref Range   Sodium 141 135 - 145 mmol/L    Current Facility-Administered Medications  Medication Dose Route Frequency Provider Last Rate Last Dose  . 0.9 %  sodium chloride infusion   Intravenous Continuous Gladstone Lighter, MD 75 mL/hr at 03/26/15 1224    . acetaminophen (TYLENOL) tablet 650 mg  650 mg Oral Q6H PRN Lance Coon, MD       Or  . acetaminophen (TYLENOL) suppository 650 mg  650 mg Rectal Q6H PRN Lance Coon, MD      . benzocaine (ORAJEL) 10 % mucosal gel   Mouth/Throat QID PRN Gladstone Lighter, MD      . cefTRIAXone (ROCEPHIN) 1 g in dextrose 5 % 50 mL IVPB  1 g Intravenous Q24H Lance Coon, MD   1 g at 03/25/15 2059  . feeding supplement (ENSURE ENLIVE) (ENSURE ENLIVE) liquid 237 mL  237 mL Oral TID BM Gladstone Lighter, MD   237 mL at 03/26/15 1037  . Influenza vac split quadrivalent PF (FLUARIX) injection 0.5 mL  0.5 mL Intramuscular Tomorrow-1000 Lance Coon, MD   0.5 mL at 03/26/15 1038  . mirtazapine (REMERON SOL-TAB)  disintegrating tablet 30 mg  30 mg Oral QHS Gonzella Lex, MD      . OLANZapine (ZYPREXA) tablet 5 mg  5 mg Oral QHS Rainey Pines, MD   5 mg at 03/25/15 2058  . ondansetron (ZOFRAN) tablet 4 mg  4 mg Oral Q6H PRN Lance Coon, MD       Or  . ondansetron [facility]) injection 4 mg  4 mg Intravenous Q6H PRN Lance Coon, MD      . phosphorus (K PHOS NEUTRAL) tablet 500 mg  500 mg Oral Once Gladstone Lighter, MD   500 mg at 03/26/15 4496  . potassium phosphate 40 mEq in dextrose 5 % 500 mL infusion  40 mEq Intravenous Once Gladstone Lighter, MD        Musculoskeletal: Strength & Muscle Tone: decreased Gait & Station: unable to stand Patient leans: N/A  Psychiatric Specialty Exam: Review of Systems  Constitutional: Positive for weight loss.  HENT: Negative.   Eyes: Negative.   Respiratory: Negative.   Cardiovascular: Negative.   Gastrointestinal: Negative.   Musculoskeletal: Negative.   Skin: Negative.   Neurological: Positive for weakness.  Psychiatric/Behavioral: Negative for suicidal ideas and substance abuse. The patient has insomnia.     Blood pressure 129/78, pulse 78, temperature 97.8 F (36.6 C), temperature source Axillary, resp. rate 20, height _0  (1.626 m), weight 48.671 kg (107 lb 4.8 oz), SpO2 98 %.Body mass index is 18.41 kg/(m^2).  General Appearance: Fairly Groomed  Engineer, water::  Fair  Speech:  Blocked and Very quiet almost unable to understand the few words she whispers  Volume:  Decreased  Mood:  Dysphoric  Affect:  Flat  Thought Process:  Irrelevant  Orientation:  Other:  Unable to reliably answer  Thought Content:  Unknown  Suicidal Thoughts:  No  Homicidal Thoughts:  No  Memory:  Negative  Judgement:  Poor  Insight:  Shallow  Psychomotor Activity:  Decreased  Concentration:  Poor  Recall:  Poor  Fund of Knowledge:Poor  Language: Poor  Akathisia:  Negative  Handed:  Right  AIMS (if indicated):     Assets:  Social Support  ADL's:  Impaired   Cognition: Impaired,  Mild  Sleep:      Treatment Plan Summary: Daily contact with patient to assess and evaluate symptoms and progress in treatment, Medication management and Plan This is a 54 year old woman who resents with catatonia although not to the point of complete shut down. She is not eating hardly speaking barely moving. No physical cause identified. Very much sounds consistent with a severe major depression with catatonic features. Patient's electrolytes are stabilizing but she is still eating very little and it was charted that she had no oral intake today. Patient and her family educated about potentially fatal outcome of this. I briefly discussed ECT as an option but did not want to press them with too much information on first acquaintance. I am going to put her on mirtazapine 30 mg at night instead of the Prozac in order to help stimulate appetite and help with sleep. We can continue the low-dose of Zyprexa. Patient can be transferred to the psychiatry ward once she is medically stable although we will not be able to do intravenous medication or fluid there. I will continue to follow up daily in the meanwhile.  Disposition: Recommend psychiatric Inpatient admission when medically cleared. Supportive therapy provided about ongoing stressors.  Sandeep Radell 03/26/2015 3:19 PM

## 2015-03-26 NOTE — Progress Notes (Signed)
Pt has eaten very poorly today and sipping on health shake and drinks.  Family did come and stay this afternoon

## 2015-03-27 LAB — COMPREHENSIVE METABOLIC PANEL
ALK PHOS: 40 U/L (ref 38–126)
ALT: 28 U/L (ref 14–54)
ANION GAP: 9 (ref 5–15)
AST: 31 U/L (ref 15–41)
Albumin: 3.6 g/dL (ref 3.5–5.0)
BILIRUBIN TOTAL: 0.8 mg/dL (ref 0.3–1.2)
BUN: 15 mg/dL (ref 6–20)
CALCIUM: 9.3 mg/dL (ref 8.9–10.3)
CO2: 33 mmol/L — ABNORMAL HIGH (ref 22–32)
Chloride: 100 mmol/L — ABNORMAL LOW (ref 101–111)
Creatinine, Ser: 0.75 mg/dL (ref 0.44–1.00)
GLUCOSE: 141 mg/dL — AB (ref 65–99)
POTASSIUM: 3.1 mmol/L — AB (ref 3.5–5.1)
Sodium: 142 mmol/L (ref 135–145)
TOTAL PROTEIN: 6.4 g/dL — AB (ref 6.5–8.1)

## 2015-03-27 LAB — CBC
HEMATOCRIT: 41.5 % (ref 35.0–47.0)
HEMOGLOBIN: 13.9 g/dL (ref 12.0–16.0)
MCH: 30 pg (ref 26.0–34.0)
MCHC: 33.5 g/dL (ref 32.0–36.0)
MCV: 89.6 fL (ref 80.0–100.0)
Platelets: 110 10*3/uL — ABNORMAL LOW (ref 150–440)
RBC: 4.63 MIL/uL (ref 3.80–5.20)
RDW: 14 % (ref 11.5–14.5)
WBC: 6.8 10*3/uL (ref 3.6–11.0)

## 2015-03-27 MED ORDER — POTASSIUM CHLORIDE 20 MEQ PO PACK
20.0000 meq | PACK | Freq: Once | ORAL | Status: AC
  Administered 2015-03-27: 20 meq via ORAL
  Filled 2015-03-27: qty 1

## 2015-03-27 MED ORDER — POTASSIUM CHLORIDE 10 MEQ/100ML IV SOLN
10.0000 meq | INTRAVENOUS | Status: DC
  Administered 2015-03-27: 10 meq via INTRAVENOUS
  Filled 2015-03-27 (×3): qty 100

## 2015-03-27 MED ORDER — POTASSIUM CHLORIDE IN NACL 20-0.9 MEQ/L-% IV SOLN
INTRAVENOUS | Status: DC
  Administered 2015-03-27: 08:00:00 via INTRAVENOUS
  Filled 2015-03-27 (×6): qty 1000

## 2015-03-27 NOTE — Care Management (Signed)
Patient was sitting, alert in bed when I passed by today. I did not meet with her as I did not want to cause any unnecessary confusion. Pending medical clearance (better PO intake) prior to transfer to [facility]. She does not have health insurance listed on her profile. RNCM will continue to follow.

## 2015-03-27 NOTE — Progress Notes (Signed)
[facility] [place] NAME: Sahniya Henneman    MR#:  [id number]  DATE OF BIRTH:  [birth year]  SUBJECTIVE:  CHIEF COMPLAINT:   Chief Complaint  Patient presents with  . 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 Was not speaking at all yesterday and not eating anything and refusing medications. -She looks much much better today. She is communicating. She was trying to eat her breakfast this morning. She refused her Remeron and Zyprexa last night. -Today she is more open to taking medications and eating food.  REVIEW OF SYSTEMS:  Review of Systems  Constitutional: Negative for fever and chills.  HENT: Negative for ear discharge, ear pain and nosebleeds.   Eyes: Negative for blurred vision.  Respiratory: Negative for cough, shortness of breath and wheezing.   Cardiovascular: Negative for chest pain and palpitations.  Gastrointestinal: Negative for nausea, vomiting, abdominal pain, diarrhea and constipation.  Genitourinary: Negative for dysuria and urgency.  Musculoskeletal: Negative for myalgias.  Neurological: Positive for weakness. Negative for dizziness, speech change, focal weakness, seizures and headaches.  Psychiatric/Behavioral: Positive for depression.    DRUG ALLERGIES:  No Known Allergies  VITALS:  Blood pressure 106/65, pulse 64, temperature 98.1 F (36.7 C), temperature source Oral, resp. rate 16, height 5\' 4"  (1.626 m), weight 48.671 kg (107 lb 4.8 oz), SpO2 98 %.  PHYSICAL EXAMINATION:  Physical Exam  GENERAL:  54 y.o.-year-old patient lying in the bed with no acute distress. More alert and communicating today. Sitter at bedside. EYES: Pupils equal, round, reactive to light and accommodation. No scleral icterus. Extraocular muscles intact.  HEENT: Head atraumatic, normocephalic. Oropharynx and nasopharynx clear.  NECK:  Supple, no jugular venous distention. No thyroid  enlargement, no tenderness.  LUNGS: Normal breath sounds bilaterally, no wheezing, rales,rhonchi or crepitation. No use of accessory muscles of respiration.  CARDIOVASCULAR: S1, S2 normal. No murmurs, rubs, or gallops.  ABDOMEN: Soft, nontender, nondistended. Scaphoid abdomen. Bowel sounds present. No organomegaly or mass.  EXTREMITIES: No pedal edema, cyanosis, or clubbing.  NEUROLOGIC: Cranial nerves II through XII are intact. Muscle strength 5/5 in all extremities. Sensation intact. Gait not checked.  PSYCHIATRIC: The patient is alert and following commands. She is oriented 3 SKIN: No obvious rash, lesion, or ulcer.    LABORATORY PANEL:   CBC  Recent Labs Lab 03/27/15 0440  WBC 6.8  HGB 13.9  HCT 41.5  PLT 110*   ------------------------------------------------------------------------------------------------------------------  Chemistries   Recent Labs Lab 03/26/15 0519  03/27/15 0440  NA 142  < > 142  K 3.1*  --  3.1*  CL 104  --  100*  CO2 29  --  33*  GLUCOSE 159*  --  141*  BUN 10  --  15  CREATININE 0.51  --  0.75  CALCIUM 9.1  --  9.3  MG 1.7  --   --   AST  --   --  31  ALT  --   --  28  ALKPHOS  --   --  40  BILITOT  --   --  0.8  < > = values in this interval not displayed. ------------------------------------------------------------------------------------------------------------------  Cardiac Enzymes  Recent Labs Lab 03/24/15 2326  TROPONINI <0.03   ------------------------------------------------------------------------------------------------------------------  RADIOLOGY:  No results found.  EKG:   Orders placed or performed during the hospital encounter of 03/24/15  . EKG 12-Lead  . EKG 12-Lead    ASSESSMENT AND PLAN:  54 year old female with unknown past medical history at this time presents to the hospital secondary to self mental left, poor by mouth intake and failure to thrive. Symptoms have started after her sister's death a  few months ago.  #1 Hypernatremia-secondary to free water deficit. Poor by mouth intake for several days now. -Has been corrected. Now on normal saline. Encourage oral hydration. Continue IV fluids for 1 more day. -Sodium has been corrected.  #2 hypokalemia-replaced.  Patient appears much better. Taking oral supplements today.  #3 urinary tract infection- continue Rocephin for now. Stop date is 03/29/2015 -If she goes to behavioral medicine unit, before that, change to oral antibiotics  #4 major depression, severe, single episode-with failure to thrive and self-neglect -Presented with catatonia features. -Appreciate psychiatric consult. Patient started on Remeron at bedtime and also Zyprexa. However she hasn't received those medications yet as she did not take any meds yesterday. Today she is much much improving. Hopefully might not need ECT. -Still remains under involuntary commitment. Sitter at bedside. As of now, plan is to discharge to behavioral medicine unit once oral intake improves. -Has some paranoia as well  #5 DVT prophylaxis-on Lovenox - patient refusing to take    All the records are reviewed and case discussed with Care Management/Social Workerr. Management plans discussed with the patient, family and they are in agreement.  CODE STATUS: Full Code  TOTAL TIME TAKING CARE OF THIS PATIENT: 39 minutes.   POSSIBLE D/C IN 1-2 DAYS, DEPENDING ON CLINICAL CONDITION.   Gladstone Lighter M.D on 03/27/2015 at 9:00 AM  Between 7am to 6pm - Pager - [phone]  After 6pm go to [email or web address] - password [email or web address] [facility]  [place] [facility]  Office  [phone]  CC: Primary care physician; No primary care provider on file.

## 2015-03-27 NOTE — Consult Note (Signed)
[name] Psych[facility]ry Consult   Reason for Consult:  Follow-up 54 year old woman who presented to the hospital with an almost catatonic level of depression and failure to eat resulting in weight loss and dehydration Referring Physician:  Tressia Miners Patient Identification: Sharon Woods MRN:  [id number] Principal Diagnosis: Depression, major, single episode, severe ([place]) Diagnosis:   Patient Active Problem List   Diagnosis Date Noted  . Protein-calorie malnutrition, severe [E43] 03/26/2015  . Depression, major, single episode, severe ([place]) [F32.2] 03/26/2015  . 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 Depressed [F32.9] 03/25/2015  . Hypernatremia [E87.0] 03/25/2015  . Dehydration [E86.0] 03/25/2015  . UTI (lower urinary tract infection) [N39.0] 03/25/2015    Total Time spent with patient: 25 minutes  Subjective:   Sharon Woods is a 54 y.o. female patient admitted with "I'm sorry, I feel bad about this".  HPI:  Patient today was able to give a coherent  history. She is quite a bit better than yesterday. She was able to talk with me although she is still shy and doesn't speak very much. She apologized multiple times for troubling [place] by being in the hospital. She apologizes for having such a large portion of food in front of her first son first saying that she thought it was too soon for her to eat food like that. Patient did not voice any active suicidal ideation but still seems to be very down and emotionally in a bad state. She has not shown any active suicidal behavior in the hospital. Fortunately she is starting to eat and drink a little bit.  Past Psychiatric History: Unclear possible past history of depression but nothing as severe as this. No past history known of suicide attempts  Risk to Self: Is patient at risk for suicide?: No Risk to Others:   Prior Inpatient Therapy:   Prior Outpatient Therapy:    Past Medical History:  Past Medical History  Diagnosis Date  .  Patient denies medical problems     Past Surgical History  Procedure Laterality Date  . No past surgeries     Family History:  Family History  Problem Relation Age of Onset  . Cancer    . Dementia     Family Psychiatric  History: Probably depression in a sister Social History:  History  Alcohol Use No     History  Drug Use No    Social History   Social History  . Marital Status: Married    Spouse Name: N/A  . Number of Children: N/A  . Years of Education: N/A   Social History Main Topics  . Smoking status: Never Smoker   . Smokeless tobacco: None  . Alcohol Use: No  . Drug Use: No  . Sexual Activity: Not Asked   Other Topics Concern  . None   Social History Narrative  . None   Additional Social History:                          Allergies:  No Known Allergies  Labs:  Results for orders placed or performed during the hospital encounter of 03/24/15 (from the past 48 hour(s))  Phosphorus     Status: Abnormal   Collection Time: 03/26/15  5:19 AM  Result Value Ref Range   Phosphorus 2.1 (L) 2.5 - 4.6 mg/dL  Magnesium     Status: None   Collection Time: 03/26/15  5:19 AM  Result Value Ref Range   Magnesium 1.7 1.7 -  2.4 mg/dL  Basic metabolic panel     Status: Abnormal   Collection Time: 03/26/15  5:19 AM  Result Value Ref Range   Sodium 142 135 - 145 mmol/L   Potassium 3.1 (L) 3.5 - 5.1 mmol/L   Chloride 104 101 - 111 mmol/L   CO2 29 22 - 32 mmol/L   Glucose, Bld 159 (H) 65 - 99 mg/dL   BUN 10 6 - 20 mg/dL   Creatinine, Ser 0.51 0.44 - 1.00 mg/dL   Calcium 9.1 8.9 - 10.3 mg/dL   GFR calc non Af Amer >60 >60 mL/min   GFR calc Af Amer >60 >60 mL/min    Comment: (NOTE) The eGFR has been calculated using the CKD EPI equation. This calculation has not been validated in all clinical situations. eGFR's persistently <60 mL/min signify possible Chronic Kidney Disease.    Anion gap 9 5 - 15  Sodium     Status: None   Collection Time: 03/26/15  11:10 AM  Result Value Ref Range   Sodium 141 135 - 145 mmol/L  Comprehensive metabolic panel     Status: Abnormal   Collection Time: 03/27/15  4:40 AM  Result Value Ref Range   Sodium 142 135 - 145 mmol/L   Potassium 3.1 (L) 3.5 - 5.1 mmol/L   Chloride 100 (L) 101 - 111 mmol/L   CO2 33 (H) 22 - 32 mmol/L   Glucose, Bld 141 (H) 65 - 99 mg/dL   BUN 15 6 - 20 mg/dL   Creatinine, Ser 0.75 0.44 - 1.00 mg/dL   Calcium 9.3 8.9 - 10.3 mg/dL   Total Protein 6.4 (L) 6.5 - 8.1 g/dL   Albumin 3.6 3.5 - 5.0 g/dL   AST 31 15 - 41 U/L   ALT 28 14 - 54 U/L   Alkaline Phosphatase 40 38 - 126 U/L   Total Bilirubin 0.8 0.3 - 1.2 mg/dL   GFR calc non Af Amer >60 >60 mL/min   GFR calc Af Amer >60 >60 mL/min    Comment: (NOTE) The eGFR has been calculated using the CKD EPI equation. This calculation has not been validated in all clinical situations. eGFR's persistently <60 mL/min signify possible Chronic Kidney Disease.    Anion gap 9 5 - 15  CBC     Status: Abnormal   Collection Time: 03/27/15  4:40 AM  Result Value Ref Range   WBC 6.8 3.6 - 11.0 K/uL   RBC 4.63 3.80 - 5.20 MIL/uL   Hemoglobin 13.9 12.0 - 16.0 g/dL   HCT 41.5 35.0 - 47.0 %   MCV 89.6 80.0 - 100.0 fL   MCH 30.0 26.0 - 34.0 pg   MCHC 33.5 32.0 - 36.0 g/dL   RDW 14.0 11.5 - 14.5 %   Platelets 110 (L) 150 - 440 K/uL    Current Facility-Administered Medications  Medication Dose Route Frequency Provider Last Rate Last Dose  . 0.9 % NaCl with KCl 20 mEq/ L  infusion   Intravenous Continuous Gladstone Lighter, MD 75 mL/hr at 03/27/15 0806    . acetaminophen (TYLENOL) tablet 650 mg  650 mg Oral Q6H PRN Lance Coon, MD       Or  . acetaminophen (TYLENOL) suppository 650 mg  650 mg Rectal Q6H PRN Lance Coon, MD      . benzocaine (HURRICAINE) 20 % mouth spray   Mouth/Throat QID PRN Gladstone Lighter, MD      . cefTRIAXone (ROCEPHIN) 1 g in dextrose 5 %  50 mL IVPB  1 g Intravenous Q24H Lance Coon, MD   1 g at 03/26/15 2120  .  feeding supplement (ENSURE ENLIVE) (ENSURE ENLIVE) liquid 237 mL  237 mL Oral TID BM Gladstone Lighter, MD   237 mL at 03/27/15 1441  . Influenza vac split quadrivalent PF (FLUARIX) injection 0.5 mL  0.5 mL Intramuscular Tomorrow-1000 Lance Coon, MD   0.5 mL at 03/26/15 1038  . mirtazapine (REMERON SOL-TAB) disintegrating tablet 30 mg  30 mg Oral QHS Gonzella Lex, MD   30 mg at 03/26/15 2120  . OLANZapine (ZYPREXA) tablet 5 mg  5 mg Oral QHS Rainey Pines, MD   5 mg at 03/25/15 2058  . ondansetron (ZOFRAN) tablet 4 mg  4 mg Oral Q6H PRN Lance Coon, MD       Or  . ondansetron [facility]) injection 4 mg  4 mg Intravenous Q6H PRN Lance Coon, MD        Musculoskeletal: Strength & Muscle Tone: decreased Gait & Station: normal Patient leans: N/A  Psychiatric Specialty Exam: Review of Systems  Constitutional: Negative.   HENT: Negative.   Eyes: Negative.   Respiratory: Negative.   Cardiovascular: Negative.   Gastrointestinal: Negative.   Musculoskeletal: Negative.   Skin: Negative.   Neurological: Negative.   Psychiatric/Behavioral: Positive for depression and memory loss. Negative for suicidal ideas, hallucinations and substance abuse. The patient is nervous/anxious and has insomnia.     Blood pressure 107/70, pulse 79, temperature 98.1 F (36.7 C), temperature source Oral, resp. rate 17, height 5' 4"  (1.626 m), weight 48.671 kg (107 lb 4.8 oz), SpO2 98 %.Body mass index is 18.41 kg/(m^2).  General Appearance: Casual  Eye Contact::  Fair  Speech:  Slow  Volume:  Decreased  Mood:  Dysphoric  Affect:  Blunt  Thought Process:  Loose  Orientation:  Full (Time, Place, and Person)  Thought Content:  Rumination  Suicidal Thoughts:  Yes.  without intent/plan  Homicidal Thoughts:  No  Memory:  Immediate;   Fair Recent;   Fair Remote;   Poor  Judgement:  Impaired  Insight:  Shallow  Psychomotor Activity:  Decreased  Concentration:  Poor  Recall:  Poor  Fund of Knowledge:Fair   Language: Fair  Akathisia:  No  Handed:  Right  AIMS (if indicated):     Assets:  Physical Health Resilience Social Support  ADL's:  Intact  Cognition: Impaired,  Mild  Sleep:      Treatment Plan Summary: Daily contact with patient to assess and evaluate symptoms and progress in treatment, Medication management and Plan Patient appears to be improving a little bit. Most likely that's because she started to get some fluid in her and got the dehydration corrected which would naturally just help her brain to work better. Fortunately she is starting to eat and drink a little bit more and is getting to be more cooperative. I still think she needs psychiatric follow-up treatment. I reviewed the case with the hospitalist today. I'm given the understanding that they think she'll probably be stable enough to be discharged from medicine tomorrow. At that point we will plan on transferring her down to the psychiatry ward. I explained this to the patient and I think she understood. She is encouraged to eat and drink as much as she can without getting sick. We will continue the current medicine I have started for her.  Disposition: Recommend psychiatric Inpatient admission when medically cleared.  John Clapacs 03/27/2015 7:10 PM

## 2015-03-28 LAB — BASIC METABOLIC PANEL
ANION GAP: 8 (ref 5–15)
BUN: 16 mg/dL (ref 6–20)
CHLORIDE: 107 mmol/L (ref 101–111)
CO2: 27 mmol/L (ref 22–32)
Calcium: 9.1 mg/dL (ref 8.9–10.3)
Creatinine, Ser: 0.54 mg/dL (ref 0.44–1.00)
Glucose, Bld: 173 mg/dL — ABNORMAL HIGH (ref 65–99)
POTASSIUM: 3.8 mmol/L (ref 3.5–5.1)
SODIUM: 142 mmol/L (ref 135–145)

## 2015-03-28 NOTE — Progress Notes (Addendum)
Pt has been sitting on bed cross legged since 6am. Will not respond to nursing staff questions, does not make eye contact, refuses medications, food/fluids. Pulls away from touch during assessment. Pt rocks back and forth, wiggles toes intermittently. Paged MD with update. Unable to offer IV medications due to risk of sedation worsening condition. Per MD, continue to encourage PO intake, continue IV fluids and supportive care. Will discuss possibility of ECT therapy with family during rounds.  Added PT consult.

## 2015-03-28 NOTE — Progress Notes (Signed)
[place] [facility] [place] NAME: Sharon Woods    MR#:  [id number]  DATE OF BIRTH:  01/03/1961  SUBJECTIVE; patient is still catatonic. Not speaking. Not communicating today. Admitted for severe depression and catatonia.   CHIEF COMPLAINT:   Chief Complaint  Patient presents with  . Psychiatric Evaluation   - REVIEW OF SYSTEMS: not talking today and unable to get ROS   Review of Systems  Unable to perform ROS: medical condition  Constitutional: Negative for fever and chills.  HENT: Negative for ear discharge, ear pain and nosebleeds.   Eyes: Negative for blurred vision.  Respiratory: Negative for cough, shortness of breath and wheezing.   Cardiovascular: Negative for chest pain and palpitations.  Gastrointestinal: Negative for nausea, vomiting, abdominal pain, diarrhea and constipation.  Genitourinary: Negative for dysuria and urgency.  Musculoskeletal: Negative for myalgias.  Neurological: Positive for weakness. Negative for dizziness, speech change, focal weakness, seizures and headaches.  Psychiatric/Behavioral: Positive for depression.    DRUG ALLERGIES:  No Known Allergies  VITALS:  Blood pressure 150/90, pulse 100, temperature 98 F (36.7 C), temperature source Oral, resp. rate 18, height 5\' 4"  (1.626 m), weight 48.671 kg (107 lb 4.8 oz), SpO2 98 %.  PHYSICAL EXAMINATION:  Physical Exam  GENERAL:  54 y.o.-year-old patient lying in the bed with no acute distress. More alert and communicating today. Sitter at bedside. EYES: Pupils equal, round, reactive to light and accommodation. No scleral icterus. Extraocular muscles intact.  HEENT: Head atraumatic, normocephalic. Oropharynx and nasopharynx clear.  NECK:  Supple, no jugular venous distention. No thyroid enlargement, no tenderness.  LUNGS: Normal breath sounds bilaterally, no wheezing, rales,rhonchi or crepitation. No use of accessory muscles of respiration.   CARDIOVASCULAR: S1, S2 normal. No murmurs, rubs, or gallops.  ABDOMEN: Soft, nontender, nondistended. Scaphoid abdomen. Bowel sounds present. No organomegaly or mass.  EXTREMITIES: No pedal edema, cyanosis, or clubbing.  NEUROLOGIC: Cranial nerves II through XII are intact. Muscle strength 5/5 in all extremities. Sensation intact. Gait not checked.  PSYCHIATRIC: The patient is alert and following commands. She is oriented 3 SKIN: No obvious rash, lesion, or ulcer.    LABORATORY PANEL:   CBC  Recent Labs Lab 03/27/15 0440  WBC 6.8  HGB 13.9  HCT 41.5  PLT 110*   ------------------------------------------------------------------------------------------------------------------  Chemistries   Recent Labs Lab 03/26/15 0519  03/27/15 0440 03/28/15 0621  NA 142  < > 142 142  K 3.1*  --  3.1* 3.8  CL 104  --  100* 107  CO2 29  --  33* 27  GLUCOSE 159*  --  141* 173*  BUN 10  --  15 16  CREATININE 0.51  --  0.75 0.54  CALCIUM 9.1  --  9.3 9.1  MG 1.7  --   --   --   AST  --   --  31  --   ALT  --   --  28  --   ALKPHOS  --   --  40  --   BILITOT  --   --  0.8  --   < > = values in this interval not displayed. ------------------------------------------------------------------------------------------------------------------  Cardiac Enzymes  Recent Labs Lab 03/24/15 2326  TROPONINI <0.03   ------------------------------------------------------------------------------------------------------------------  RADIOLOGY:  No results found.  EKG:   Orders placed or performed during the hospital encounter of 03/24/15  . EKG 12-Lead  . EKG 12-Lead    ASSESSMENT AND PLAN:  54 year old female with unknown past medical history at this time presents to the hospital secondary to poor by mouth intake and failure to thrive. Symptoms have started after her sister's death a few months ago.  #1 Hypernatremia-secondary to free water deficit. Poor by mouth intake for several  days now. -Has been corrected. Now on normal saline. Encourage oral hydration. Continue IV fluids as her po intake still poor. -Sodium has been corrected.  #2 hypokalemia-replaced.    #3 urinary tract infection- continue Rocephin for now. Stop date is 03/29/2015 -If she goes to behavioral medicine unit,  can change to Cipro.  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 -Appreciate psychiatric consult. Patient started on Remeron at bedtime and also Zyprexa.  -Still remains under involuntary commitment. Sitter at bedside. As of now, plan is to discharge to behavioral medicine unit once oral intake improves. -Has some paranoia as well  #5 DVT prophylaxis-on Lovenox - patient refusing to take    All the records are reviewed and case discussed with Care Management/Social Workerr. Management plans discussed with the patient, family and they are in agreement.  CODE STATUS: Full Code  TOTAL TIME TAKING CARE OF THIS PATIENT: 39 minutes.   POSSIBLE D/C IN 1-2 DAYS, DEPENDING ON CLINICAL CONDITION.   Epifanio Lesches M.D on 03/28/2015 at 8:50 AM  Between 7am to 6pm - Pager - [phone]  After 6pm go to [email or web address] - password [email or web address] [facility]  [place] [facility]  Office  [phone]  CC: Primary care physician; No primary care provider on file.

## 2015-03-28 NOTE — Progress Notes (Signed)
Sitter provided vital sign data to manually enter because the dinamap would not upload to flowchart.

## 2015-03-28 NOTE — Consult Note (Signed)
[facility] Consult   Reason for Consult:  Follow-up for 54 year old woman who appears to have severe major depression with a semicatatonic presentation. Referring Physician:  Vianne Woods Patient Identification: Sharon Woods MRN:  [id number] Principal Diagnosis: Depression, major, single episode, severe ([place]) Diagnosis:   Patient Active Problem List   Diagnosis Date Noted  . Protein-calorie malnutrition, severe [E43] 03/26/2015  . Depression, major, single episode, severe ([place]) [F32.2] 03/26/2015  . 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 Depressed [F32.9] 03/25/2015  . Hypernatremia [E87.0] 03/25/2015  . Dehydration [E86.0] 03/25/2015  . UTI (lower urinary tract infection) [N39.0] 03/25/2015    Total Time spent with patient: 25 minutes  Subjective:   Sharon Woods is a 54 y.o. female patient admitted with Patient was not able to give any information.  HPI:  54 year old woman who presented to the hospital at the insistence of her son because of several weeks of declining food intake and near catatonic behavior. See previous notes. On evaluation today the patient was awake and made intermittent eye contact but did not make any lucid verbal communication. I spent some time explaining to her the rationale for ECT and my recommendation. She did not give me any indication that she understood what I was talking about. Called me this morning concerned that the patient continues to take very little by mouth and is also refusing oral medication. Patient has not acted out in a way to actively try to harm herself.  I spoke with the patient's son on the telephone twice this afternoon. I told him my recommendation for ECT and offered to meet with him and answer any questions. The son is consulting with his father and younger brother about this and will get back to me tomorrow.  Past Psychiatric History: No clear past psychiatric history  Risk to Self: Is patient at risk for suicide?:  No Risk to Others:   Prior Inpatient Therapy:   Prior Outpatient Therapy:    Past Medical History:  Past Medical History  Diagnosis Date  . Patient denies medical problems     Past Surgical History  Procedure Laterality Date  . No past surgeries     Family History:  Family History  Problem Relation Age of Onset  . Cancer    . Dementia     Family Psychiatric  History: Probably her sister had depression Social History:  History  Alcohol Use No     History  Drug Use No    Social History   Social History  . Marital Status: Married    Spouse Name: N/A  . Number of Children: N/A  . Years of Education: N/A   Social History Main Topics  . Smoking status: Never Smoker   . Smokeless tobacco: None  . Alcohol Use: No  . Drug Use: No  . Sexual Activity: Not Asked   Other Topics Concern  . None   Social History Narrative  . None   Additional Social History:                          Allergies:  No Known Allergies  Labs:  Results for orders placed or performed during the hospital encounter of 03/24/15 (from the past 48 hour(s))  Comprehensive metabolic panel     Status: Abnormal   Collection Time: 03/27/15  4:40 AM  Result Value Ref Range   Sodium 142 135 - 145 mmol/L   Potassium 3.1 (L) 3.5 - 5.1 mmol/L  Chloride 100 (L) 101 - 111 mmol/L   CO2 33 (H) 22 - 32 mmol/L   Glucose, Bld 141 (H) 65 - 99 mg/dL   BUN 15 6 - 20 mg/dL   Creatinine, Ser 0.75 0.44 - 1.00 mg/dL   Calcium 9.3 8.9 - 10.3 mg/dL   Total Protein 6.4 (L) 6.5 - 8.1 g/dL   Albumin 3.6 3.5 - 5.0 g/dL   AST 31 15 - 41 U/L   ALT 28 14 - 54 U/L   Alkaline Phosphatase 40 38 - 126 U/L   Total Bilirubin 0.8 0.3 - 1.2 mg/dL   GFR calc non Af Amer >60 >60 mL/min   GFR calc Af Amer >60 >60 mL/min    Comment: (NOTE) The eGFR has been calculated using the CKD EPI equation. This calculation has not been validated in all clinical situations. eGFR's persistently <60 mL/min signify possible Chronic  Kidney Disease.    Anion gap 9 5 - 15  CBC     Status: Abnormal   Collection Time: 03/27/15  4:40 AM  Result Value Ref Range   WBC 6.8 3.6 - 11.0 K/uL   RBC 4.63 3.80 - 5.20 MIL/uL   Hemoglobin 13.9 12.0 - 16.0 g/dL   HCT 41.5 35.0 - 47.0 %   MCV 89.6 80.0 - 100.0 fL   MCH 30.0 26.0 - 34.0 pg   MCHC 33.5 32.0 - 36.0 g/dL   RDW 14.0 11.5 - 14.5 %   Platelets 110 (L) 150 - 440 K/uL  Basic metabolic panel     Status: Abnormal   Collection Time: 03/28/15  6:21 AM  Result Value Ref Range   Sodium 142 135 - 145 mmol/L   Potassium 3.8 3.5 - 5.1 mmol/L   Chloride 107 101 - 111 mmol/L   CO2 27 22 - 32 mmol/L   Glucose, Bld 173 (H) 65 - 99 mg/dL   BUN 16 6 - 20 mg/dL   Creatinine, Ser 0.54 0.44 - 1.00 mg/dL   Calcium 9.1 8.9 - 10.3 mg/dL   GFR calc non Af Amer >60 >60 mL/min   GFR calc Af Amer >60 >60 mL/min    Comment: (NOTE) The eGFR has been calculated using the CKD EPI equation. This calculation has not been validated in all clinical situations. eGFR's persistently <60 mL/min signify possible Chronic Kidney Disease.    Anion gap 8 5 - 15    Current Facility-Administered Medications  Medication Dose Route Frequency Provider Last Rate Last Dose  . 0.9 % NaCl with KCl 20 mEq/ L  infusion   Intravenous Continuous Gladstone Lighter, MD 75 mL/hr at 03/27/15 0806    . acetaminophen (TYLENOL) tablet 650 mg  650 mg Oral Q6H PRN Lance Coon, MD       Or  . acetaminophen (TYLENOL) suppository 650 mg  650 mg Rectal Q6H PRN Lance Coon, MD      . benzocaine (HURRICAINE) 20 % mouth spray   Mouth/Throat QID PRN Gladstone Lighter, MD      . cefTRIAXone (ROCEPHIN) 1 g in dextrose 5 % 50 mL IVPB  1 g Intravenous Q24H Lance Coon, MD   1 g at 03/27/15 2133  . feeding supplement (ENSURE ENLIVE) (ENSURE ENLIVE) liquid 237 mL  237 mL Oral TID BM Gladstone Lighter, MD   237 mL at 03/27/15 2133  . Influenza vac split quadrivalent PF (FLUARIX) injection 0.5 mL  0.5 mL Intramuscular Tomorrow-1000  Lance Coon, MD   0.5 mL at 03/26/15 1038  .  mirtazapine (REMERON SOL-TAB) disintegrating tablet 30 mg  30 mg Oral QHS Gonzella Lex, MD   30 mg at 03/26/15 2120  . OLANZapine (ZYPREXA) tablet 5 mg  5 mg Oral QHS Rainey Pines, MD   5 mg at 03/25/15 2058  . ondansetron (ZOFRAN) tablet 4 mg  4 mg Oral Q6H PRN Lance Coon, MD       Or  . ondansetron [facility]) injection 4 mg  4 mg Intravenous Q6H PRN Lance Coon, MD        Musculoskeletal: Strength & Muscle Tone: decreased Gait & Station: unable to stand Patient leans: N/A  Psychiatric Specialty Exam: Review of Systems  Unable to perform ROS: psychiatric disorder    Blood pressure 150/90, pulse 100, temperature 98 F (36.7 C), temperature source Oral, resp. rate 18, height 5' 4"  (1.626 m), weight 48.671 kg (107 lb 4.8 oz), SpO2 98 %.Body mass index is 18.41 kg/(m^2).  General Appearance: Disheveled  Eye Contact::  Minimal  Speech:  Blocked  Volume:  Decreased  Mood:  Anxious  Affect:  Congruent  Thought Process:  Disorganized  Orientation:  Negative  Thought Content:  Negative  Suicidal Thoughts:  unknown  Homicidal Thoughts:  unknown  Memory:  Negative  Judgement:  Negative  Insight:  Negative  Psychomotor Activity:  Decreased  Concentration:  Poor  Recall:  Poor  Fund of Knowledge:Poor  Language: Poor  Akathisia:  No  Handed:  Right  AIMS (if indicated):     Assets:  Social Support  ADL's:  Impaired  Cognition: Impaired,  Moderate and Severe  Sleep:      Treatment Plan Summary: Daily contact with patient to assess and evaluate symptoms and progress in treatment, Medication management and Plan patient had been ordered Remeron and Zyprexa for management of major depression with psychotic features but has been refusing medicine. I spoke with the patient today recommending ECT. Patient was not able to carry on a discussion. I spoke with her son as well. Son is going to consult with the rest of the family and we will  continue to talk tomorrow. There is some chance of possibly being able to start as early as Friday otherwise we may look towards next Wednesday. Unfortunately there is really not a safe and effective antidepressant that is intravenous or IM to use. I pleaded with the patient to please take the oral disintegrating medication. I will continue to follow-up with her regularly. If she is medically stable enough we may consider transfer downstairs but she is continuing currently to need IV fluid.  Disposition: Recommend psychiatric Inpatient admission when medically cleared. Supportive therapy provided about ongoing stressors.  Parry Po 03/28/2015 5:23 PM

## 2015-03-28 NOTE — Progress Notes (Signed)
Pt sat up and ate 5 french fries and 1 chicken tender for lunch. Ordered finger foods, placed food in hand and pt ate willingly.  Was able to stand/pivot to [facility] on 2 occasions, with over 58mL output. Pt continues to sit at side of bed, but becoming slightly more interactive, will look at staff and nod/shake head or speak softly 1-2 word phrases. Will notify MD during rounds.

## 2015-03-29 ENCOUNTER — Inpatient Hospital Stay
Admission: RE | Admit: 2015-03-29 | Discharge: 2015-04-16 | DRG: 885 | Disposition: A | Discharge status: Home / Self Care | Payer: No Typology Code available for payment source | Source: Intra-hospital | Attending: Psychiatry | Admitting: Psychiatry

## 2015-03-29 DIAGNOSIS — Z681 Body mass index (BMI) 19 or less, adult: Secondary | ICD-10-CM

## 2015-03-29 DIAGNOSIS — Z820 Family history of epilepsy and other diseases of the nervous system: Secondary | ICD-10-CM | POA: Diagnosis not present

## 2015-03-29 DIAGNOSIS — F061 Catatonic disorder due to known physiological condition: Secondary | ICD-10-CM | POA: Diagnosis present

## 2015-03-29 DIAGNOSIS — F202 Catatonic schizophrenia: Secondary | ICD-10-CM | POA: Diagnosis present

## 2015-03-29 DIAGNOSIS — G4700 Insomnia, unspecified: Secondary | ICD-10-CM | POA: Diagnosis present

## 2015-03-29 DIAGNOSIS — Z590 Homelessness: Secondary | ICD-10-CM | POA: Diagnosis not present

## 2015-03-29 DIAGNOSIS — K5900 Constipation, unspecified: Secondary | ICD-10-CM | POA: Diagnosis present

## 2015-03-29 DIAGNOSIS — Z818 Family history of other mental and behavioral disorders: Secondary | ICD-10-CM | POA: Diagnosis not present

## 2015-03-29 DIAGNOSIS — F323 Major depressive disorder, single episode, severe with psychotic features: Principal | ICD-10-CM | POA: Diagnosis present

## 2015-03-29 DIAGNOSIS — E870 Hyperosmolality and hypernatremia: Secondary | ICD-10-CM | POA: Diagnosis present

## 2015-03-29 DIAGNOSIS — F419 Anxiety disorder, unspecified: Secondary | ICD-10-CM | POA: Diagnosis present

## 2015-03-29 DIAGNOSIS — N390 Urinary tract infection, site not specified: Secondary | ICD-10-CM | POA: Diagnosis present

## 2015-03-29 DIAGNOSIS — F322 Major depressive disorder, single episode, severe without psychotic features: Secondary | ICD-10-CM | POA: Diagnosis present

## 2015-03-29 DIAGNOSIS — Z809 Family history of malignant neoplasm, unspecified: Secondary | ICD-10-CM | POA: Diagnosis not present

## 2015-03-29 DIAGNOSIS — R45851 Suicidal ideations: Secondary | ICD-10-CM | POA: Diagnosis present

## 2015-03-29 DIAGNOSIS — E43 Unspecified severe protein-calorie malnutrition: Secondary | ICD-10-CM | POA: Diagnosis present

## 2015-03-29 MED ORDER — ENSURE ENLIVE PO LIQD
237.0000 mL | Freq: Three times a day (TID) | ORAL | Status: DC
  Administered 2015-03-29 – 2015-04-16 (×50): 237 mL via ORAL

## 2015-03-29 MED ORDER — MIRTAZAPINE 15 MG PO TBDP
30.0000 mg | ORAL_TABLET | Freq: Every day | ORAL | Status: DC
  Administered 2015-03-31 – 2015-04-15 (×14): 30 mg via ORAL
  Filled 2015-03-29: qty 1
  Filled 2015-03-29 (×9): qty 2
  Filled 2015-03-29: qty 1
  Filled 2015-03-29 (×3): qty 2
  Filled 2015-03-29 (×4): qty 1
  Filled 2015-03-29: qty 2

## 2015-03-29 MED ORDER — ALUM & MAG HYDROXIDE-SIMETH 200-200-20 MG/5ML PO SUSP: 30.0000 mL | ORAL | Status: DC | PRN

## 2015-03-29 MED ORDER — MIRTAZAPINE 30 MG PO TBDP
30.0000 mg | ORAL_TABLET | Freq: Every day | ORAL | Status: DC
  Filled 2015-03-29: qty 1

## 2015-03-29 MED ORDER — OLANZAPINE 5 MG PO TBDP
10.0000 mg | ORAL_TABLET | Freq: Every day | ORAL | Status: DC
  Administered 2015-03-31: 10 mg via ORAL
  Filled 2015-03-29 (×3): qty 2

## 2015-03-29 MED ORDER — OLANZAPINE 5 MG PO TBDP
5.0000 mg | ORAL_TABLET | Freq: Every day | ORAL | Status: DC
  Filled 2015-03-29: qty 1

## 2015-03-29 MED ORDER — ACETAMINOPHEN 325 MG PO TABS
650.0000 mg | ORAL_TABLET | Freq: Four times a day (QID) | ORAL | Status: DC | PRN
  Filled 2015-03-29 (×2): qty 2

## 2015-03-29 MED ORDER — MAGNESIUM HYDROXIDE 400 MG/5ML PO SUSP
30.0000 mL | Freq: Every day | ORAL | Status: DC | PRN
  Administered 2015-04-12 – 2015-04-13 (×2): 30 mL via ORAL
  Filled 2015-03-29 (×2): qty 30

## 2015-03-29 NOTE — Discharge Summary (Signed)
Sharon Woods, is a 54 y.o. female  DOB 08/31/1960  MRN [id number].  Admission date:  03/24/2015  Admitting Physician  Lance Coon, MD  Discharge Date:  03/29/2015   Primary MD  No primary care provider on file.  Recommendations for primary care physician for things to follow:  Discharge to [facility]   Admission Diagnosis  Dehydration [E86.0] Hypernatremia [E87.0] Depression [F32.9] Involuntary commitment [Z04.6] Sepsis, due to unspecified organism ([place]) [A41.9] Urinary tract infection without hematuria, site unspecified [N39.0] Altered mental status, unspecified altered mental status type [R41.82]   Discharge Diagnosis  Dehydration [E86.0] Hypernatremia [E87.0] Depression [F32.9] Involuntary commitment [Z04.6] Sepsis, due to unspecified organism ([place]) [A41.9] Urinary tract infection without hematuria, site unspecified [N39.0] Altered mental status, unspecified altered mental status type [R41.82]  *  Principal Problem:   Depression, major, single episode, severe ([place]) Active Problems:   Depressed   Hypernatremia   Dehydration   UTI (lower urinary tract infection)   Protein-calorie malnutrition, severe   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 Patient denies medical problems     Past Surgical History  Procedure Laterality Date  . No past surgeries         History of present illness and  Hospital Course:     Kindly see H&P for history of present illness and admission details, please review complete Labs, Consult reports and Test reports for all details in brief  HPI  from the history and physical done on the day of admission  54 year old woman came in because of failure to eat resulting in weight loss . Made IVC in ER.seen by psychbecause of catatonia, depression  Possible to  have hypernatremia with sodium 153. Admitted to hospitalist service for hypernatremia. Hospital Course  1. acute hypernatremia secondary to poor by mouth intake: Hypernatremia improved with IV hydration. Is down from 155-144. 2. Acute Hypokalemia:Improved with potassium replacement.   3 major depression; severe single episode. Failure to thrive, self-neglect: With catatonic features. Patient seen by psychiatric doctor. Started on Remeron, Zyprexa. Patient by mouth intake is better. So patient is stable to go to [facility] downstairs as her electrolytes are also better and by mouth intake is better.  4. Severe protein calorie malnutrition secondary to social and environmental circumstances: Ensure enlive TID,, homemade milkshakes. 5 UTI. Cultures negative. But patient received IV Rocephin for 5 days.  Discharge Condition: stable   Follow UP      Discharge Instructions  and  Discharge Medications       Medication List    Notice    You have not been prescribed any medications.        Diet and Activity recommendation: See Discharge Instructions above   Consults obtained - psych   Major procedures and Radiology Reports - PLEASE review detailed and final reports for all details, in brief -      Dg Chest Portable 1 View  03/25/2015  CLINICAL DATA:  Involuntary commitment. Admission chest radiograph. Initial encounter. EXAM: PORTABLE CHEST 1 VIEW COMPARISON:  None. FINDINGS: The lungs are well-aerated and clear. There is no evidence of focal opacification, pleural effusion or pneumothorax. The cardiomediastinal silhouette is within normal limits. No acute osseous abnormalities are seen. IMPRESSION: No acute cardiopulmonary process seen. Electronically Signed   By: Garald Balding M.D.   On: 03/25/2015 01:35    Micro Results    Recent Results (from the past 240 hour(s))  Urine culture     Status:  None   Collection Time: 03/25/15  1:52 AM  Result Value Ref Range Status   Specimen  Description URINE, RANDOM  Final   Special Requests Normal  Final   Culture NO GROWTH 1 DAY  Final   Report Status 03/26/2015 FINAL  Final       Today   Subjective:   Sharon Woods  Seen today.stable to go to BHU<, Objective:   Blood pressure 115/80, pulse 72, temperature 97.8 F (36.6 C), temperature source Oral, resp. rate 18, height 5\' 4"  (1.626 m), weight 48.671 kg (107 lb 4.8 oz), SpO2 98 %.   Intake/Output Summary (Last 24 hours) at 03/29/15 1348 Last data filed at 03/29/15 0605  Gross per 24 hour  Intake    530 ml  Output   1100 ml  Net   -570 ml    Exam Awake Alert, Oriented x 3, No new F.N deficits, Normal affect Mutual.AT,PERRAL Supple Neck,No JVD, No cervical lymphadenopathy appriciated.  Symmetrical Chest wall movement, Good air movement bilaterally, CTAB RRR,No Gallops,Rubs or new Murmurs, No Parasternal Heave +ve B.Sounds, Abd Soft, Non tender, No organomegaly appriciated, No rebound -guarding or rigidity. No Cyanosis, Clubbing or edema, No new Rash or bruise  Data Review   CBC w Diff: Lab Results  Component Value Date   WBC 6.8 03/27/2015   WBC 9.0 10/07/2013   HGB 13.9 03/27/2015   HGB 13.6 10/07/2013   HCT 41.5 03/27/2015   HCT 40.2 10/07/2013   PLT 110* 03/27/2015   PLT 228 10/07/2013    CMP: Lab Results  Component Value Date   NA 142 03/28/2015   NA 141 10/07/2013   K 3.8 03/28/2015   K 3.2* 10/07/2013   CL 107 03/28/2015   CL 105 10/07/2013   CO2 27 03/28/2015   CO2 29 10/07/2013   BUN 16 03/28/2015   BUN 9 10/07/2013   CREATININE 0.54 03/28/2015   CREATININE 0.74 10/07/2013   PROT 6.4* 03/27/2015   PROT 7.2 10/07/2013   ALBUMIN 3.6 03/27/2015   ALBUMIN 3.6 10/07/2013   BILITOT 0.8 03/27/2015   BILITOT 0.4 10/07/2013   ALKPHOS 40 03/27/2015   ALKPHOS 67 10/07/2013   AST 31 03/27/2015   AST 16 10/07/2013   ALT 28 03/27/2015   ALT 17 10/07/2013  .   Total Time in preparing paper work, data evaluation and todays exam - 25  minutes  Aaren Krog M.D on 03/29/2015 at 1:48 PM    Note: This dictation was prepared with Dragon dictation along with smaller phrase technology. Any transcriptional errors that result from this process are unintentional.

## 2015-03-29 NOTE — Progress Notes (Signed)
Pt transferred to Behavioral health by Jerel Shepherd, RN. Security escort accompanied with transfer.

## 2015-03-29 NOTE — Progress Notes (Signed)
Patient with depressed affect, slightly anxious. Flat affect, quiet speech. Patient oriented to name only and patient unable to answer some admission interview questions. Skin check performed and no contraband found. Skin check performed and no wounds or bruises noted. Poor adls and clothes to laundry room to be laundered. Oriented to unit and admission process. Patient states "Im scared". Reassurance and support with good effect. MD into visit. Patient oriented to room, bathroom and dayroom. Dinner ordered ([employer]) and patient states "I ate some burger and a few fries". Provided patient with cups of ice water and tolerates well. Patient walks with slow, steady gait, placed on falls protocol per nurse judgement. Safety maintained.

## 2015-03-29 NOTE — Evaluation (Signed)
Physical Therapy Evaluation Patient Details Name: Sharon Woods MRN: [id number] DOB: 07-01-1960 Today's Date: 03/29/2015   History of Present Illness  presented to ER due to depressed affect, overall decline in status (decreased PO, neglecting self); admitted under IVC for severe depression with catatonic features, dehydration with hypernatremia and UTI.  Clinical Impression  Upon evaluation, patient alert and oriented to self, general location; follows all simple commands; pleasant and cooperative with all evaluation components.  Difficulty answering open-ended questions at times, but does accurately answer/participate with yes/no questions.  Bilat UE/LE strength and ROM grossly WFL and symmetrical without focal weakness appreciated; no pain noted.  Able to complete bed mobility indep; sit/stand, basic transfers and gait (220') without assist device, min assist for frequent L lateral LOB.  All mobility improved to cga/close sup with use of RW (220'); recommend continued use of RW and +1 assist with all mobility at this time.  Patient/sitter voiced understanding and agreement. Would benefit from skilled PT to address above deficits and promote optimal return to PLOF; Recommend transition to [place] upon discharge from acute hospitalization.  Per chart, anticipate discharge to [facility] once medically stable.     Follow Up Recommendations Home health PT (per chart, planning for discharge to [place] once medically stable)    Equipment Recommendations  Rolling walker with 5" wheels    Recommendations for Other Services       Precautions / Restrictions Precautions Precautions: Fall Restrictions Weight Bearing Restrictions: No      Mobility  Bed Mobility Overal bed mobility: Modified Independent                Transfers Overall transfer level: Needs assistance Equipment used: None Transfers: Sit to/from Stand Sit to Stand: Min guard;Min assist         General transfer comment:  frequent posterior LOB with movement transitions (esp as speed of movement transition increases)  Ambulation/Gait Ambulation/Gait assistance: Min assist Ambulation Distance (Feet): 220 Feet Assistive device: None       General Gait Details: very narrowed, near-tandem BOS; frequent L lateral LOB requiring min assist from therapist for correction.  Decreased cadence with very guarded trunk posturing, limited arm swing.  Stairs            Wheelchair Mobility    Modified Rankin (Stroke Patients Only)       Balance Overall balance assessment: Needs assistance Sitting-balance support: No upper extremity supported;Feet supported Sitting balance-Leahy Scale: Normal     Standing balance support: No upper extremity supported Standing balance-Leahy Scale: Fair                               Pertinent Vitals/Pain Pain Assessment: No/denies pain    Home Living Family/patient expects to be discharged to:: Unsure Living Arrangements: Children;Parent               Additional Comments: Patient unwilling/unable to answer direct, open-ended questions    Prior Function Level of Independence: Independent         Comments: Indep with all mobility without use of assist device     Hand Dominance        Extremity/Trunk Assessment   Upper Extremity Assessment: Overall WFL for tasks assessed           Lower Extremity Assessment: Overall WFL for tasks assessed (grossly at least 4/5 throughout)         Communication      Cognition Arousal/Alertness:  Awake/alert Behavior During Therapy: Flat affect Overall Cognitive Status: Within Functional Limits for tasks assessed                      General Comments      Exercises Other Exercises Other Exercises: Gait x220' with RW, cga/close sup--improved safety, stability and overall fluidity with use of RW; patient subjectively reporting optimal comfort/safety with use of RW.  Continues to require  verbal cuing for increased step width  5x sit/stand without assist device, 26 seconds; frequent posterior LOB with movement transition as speed of movement increased.  Indicative of increased fall risk with all functional activities.      Assessment/Plan    PT Assessment Patient needs continued PT services  PT Diagnosis Difficulty walking;Generalized weakness   PT Problem List Decreased balance;Decreased mobility;Decreased safety awareness;Decreased knowledge of precautions  PT Treatment Interventions DME instruction;Gait training;Stair training;Functional mobility training;Therapeutic activities;Therapeutic exercise;Patient/family education;Balance training   PT Goals (Current goals can be found in the Care Plan section) Acute Rehab PT Goals PT Goal Formulation: Patient unable to participate in goal setting Time For Goal Achievement: 04/12/15 Potential to Achieve Goals: Good    Frequency Min 2X/week   Barriers to discharge Decreased caregiver support      Co-evaluation               End of Session Equipment Utilized During Treatment: Gait belt Activity Tolerance: Patient tolerated treatment well Patient left: in bed;with call bell/phone within reach;with nursing/sitter in room           Time: 0945-1004 PT Time Calculation (min) (ACUTE ONLY): 19 min   Charges:   PT Evaluation $Initial PT Evaluation Tier I: 1 Procedure PT Treatments $Gait Training: 8-22 mins   PT G Codes:        Tersea Aulds H. Owens Shark, PT, DPT, NCS 03/29/2015, 10:30 AM [phone]

## 2015-03-29 NOTE — Progress Notes (Signed)
PT Cancellation Note  Patient Details Name: KAMALANI AMINI MRN: [id number] DOB: 1960/11/09   Cancelled Treatment:    Reason Eval/Treat Not Completed: Other (comment) (Consult received and chart reviewed. Patient currently eating breakfast. Will re-attempt at later time as patient available.)   Keldon Lassen H. Owens Shark, PT, DPT, NCS 03/29/2015, 9:06 AM [phone]

## 2015-03-29 NOTE — Progress Notes (Signed)
Pt cleared to go to behavioral health. rn  Spoke with dr [name]. No avs . md reports send discharge summary

## 2015-03-29 NOTE — [facility] Group Notes (Signed)
[place] LCSW Group Therapy   03/29/2015 11:00 am  Type of Therapy: Group Therapy   Participation Level: Did Not Attend. Patient invited to participate but declined.    Alphonse Guild. Berkley Wrightsman, MSW, LCSWA, LCAS

## 2015-03-29 NOTE — BHH Suicide Risk Assessment (Signed)
[facility] Admission Suicide Risk Assessment   Nursing information obtained from:    Demographic factors:    Current Mental Status:    Loss Factors:    Historical Factors:    Risk Reduction Factors:    Total Time spent with patient: 1 hour Principal Problem: Severe major depression with psychotic features, mood-congruent ([place]) Diagnosis:   Patient Active Problem List   Diagnosis Date Noted  . Severe major depression with psychotic features, mood-congruent ([place]) [F32.3]     Priority: High  . Protein-calorie malnutrition, severe [E43] 03/26/2015  . Depression, major, single episode, severe ([place]) [F32.2] 03/26/2015  . 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 Hypernatremia [E87.0] 03/25/2015  . UTI (lower urinary tract infection) [N39.0] 03/25/2015     Continued Clinical Symptoms:  Alcohol Use Disorder Identification Test Final Score (AUDIT): 0 The "Alcohol Use Disorders Identification Test", Guidelines for Use in Primary Care, Second Edition.  World Pharmacologist [facility]). Score between 0-7:  no or low risk or alcohol related problems. Score between 8-15:  moderate risk of alcohol related problems. Score between 16-19:  high risk of alcohol related problems. Score 20 or above:  warrants further diagnostic evaluation for alcohol dependence and treatment.   CLINICAL FACTORS:   Depression:   Delusional Hopelessness Severe   Musculoskeletal: Strength & Muscle Tone: decreased Gait & Station: normal Patient leans: N/A  Psychiatric Specialty Exam: Physical Exam  Nursing note and vitals reviewed. Constitutional: She appears well-developed and well-nourished.  HENT:  Head: Normocephalic and atraumatic.  Eyes: Conjunctivae are normal. Pupils are equal, round, and reactive to light.  Neck: Normal range of motion.  Cardiovascular: Normal rate, regular rhythm and normal heart sounds.   Respiratory: Effort normal and breath sounds normal. No respiratory distress.  GI: Soft.   Musculoskeletal: Normal range of motion.  Neurological: She is alert.  Skin: Skin is warm and dry.    ROS  Blood pressure 135/85, pulse 88, temperature 98.1 F (36.7 C), temperature source Oral, resp. rate 16, height 5\' 4"  (1.626 m), weight 48.535 kg (107 lb), SpO2 98 %.Body mass index is 18.36 kg/(m^2).  General Appearance: Fairly Groomed  Engineer, water::  Fair  Speech:  Slow and Slurred  Volume:  Decreased  Mood:  Dysphoric  Affect:  Flat  Thought Process:  Disorganized  Orientation:  Negative  Thought Content:  Hallucinations: Auditory  Suicidal Thoughts:  No  Homicidal Thoughts:  No  Memory:  Immediate;   Fair Recent;   Poor Remote;   Poor  Judgement:  Impaired  Insight:  Shallow  Psychomotor Activity:  Decreased  Concentration:  Poor  Recall:  Poor  Fund of Knowledge:Poor  Language: Poor  Akathisia:  No  Handed:  Right  AIMS (if indicated):     Assets:  Housing Physical Health Resilience  Sleep:     Cognition: Impaired,  Mild  ADL's:  Intact     COGNITIVE FEATURES THAT CONTRIBUTE TO RISK:  Thought constriction (tunnel vision)    SUICIDE RISK:   Mild:  Suicidal ideation of limited frequency, intensity, duration, and specificity.  There are no identifiable plans, no associated intent, mild dysphoria and related symptoms, good self-control (both objective and subjective assessment), few other risk factors, and identifiable protective factors, including available and accessible social support.  PLAN OF CARE: Patient is being admitted to the psychiatry service. She is on 15 minute checks. She is being treated with medication for major depression with psychotic features. She will be engaged in daily evaluation and group therapy  to assess improvement in symptoms prior to discharge planning  Medical Decision Making:  New problem, with additional work up planned, Review of Psycho-Social Stressors (1), Review or order clinical lab tests (1) and Review of Medication Regimen  & Side Effects (2)  I certify that inpatient services furnished can reasonably be expected to improve the patient's condition.   John Clapacs 03/29/2015, 4:53 PM

## 2015-03-29 NOTE — H&P (Addendum)
Psychiatric Admission Assessment Adult  Patient Identification: ROBERTINE KIPPER MRN:  [id number] Date of Evaluation:  03/29/2015 Chief Complaint:  major depression Principal Diagnosis: Severe major depression with psychotic features, mood-congruent ([place]) Diagnosis:   Patient Active Problem List   Diagnosis Date Noted  . Severe major depression with psychotic features, mood-congruent ([place]) [F32.3]     Priority: High  . Protein-calorie malnutrition, severe [E43] 03/26/2015  . Depression, major, single episode, severe ([place]) [F32.2] 03/26/2015  . 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 Hypernatremia [E87.0] 03/25/2015  . UTI (lower urinary tract infection) [N39.0] 03/25/2015   History of Present Illness:: This is a 54 year old woman without a past known psychiatric history who presented to the hospital last week with a history of 3 or 4 weeks of decline in oral intake activity and speech to the point where she was not eating or drinking at all. Patient was admitted to the medical service with disrupted electrolytes and possible urinary tract infection and lack of oral intake. She was treated with antibiotics and IV hydration for several days. During that time I have been following her. Patient has waxed and waned a little bit. At her worst for several days she was not eating at all. She would hardly respond when spoken to. Often appeared to be confused. Her son gave a history that this is not her baseline and that she had been progressing to this point over several weeks possibly as much as 3 months which is the time since her sister died. She will stress came from the fact that she and her son had been moving back and forth between [place] in [place] and had been homeless at times with a great deal of financial stress. Patient was not receiving any psychiatric treatment. No evidence of substance abuse. Patient had not made a suicide attempt and had not talked about actually trying to kill her  self although she had indicated at times feeling of despair and hopelessness. On many occasions she appeared to be quite confused and at times on the medical service appeared to be hallucinating. As recently as yesterday the patient was almost completely unresponsive to maintain and was not eating. Apparently this morning she has had some improvement and has eaten then consume some liquid and has been ambulating around the floor more. She still is not very interactive and not able to give much history except that she is able to indicate she's been feeling down in the dumps and sad since her sister died.  Patient does not have a significant known ongoing medical history. Does not routinely get medical follow-up. Was not on any medicine did not have a primary care doctor.  Patient denies any history of current or past substance abuse problems.  Social history marked by quite a bit of deprivation. She travels with her son. It sounds like they live mainly in [place] but had been moved up to [place] for quite a while and during that time wound up homeless living in a tent. Now that they're back in [place] they were at the homeless shelter for a while. It sounds like family is helping them out now. Associated Signs/Symptoms: Depression Symptoms:  depressed mood, anhedonia, insomnia, psychomotor retardation, feelings of worthlessness/guilt, difficulty concentrating, hopelessness, impaired memory, suicidal thoughts with specific plan, loss of energy/fatigue, disturbed sleep, weight loss, decreased appetite, (Hypo) Manic Symptoms:  No signs of mania Anxiety Symptoms:  Excessive Worry, Psychotic Symptoms:  Hallucinations: Auditory PTSD Symptoms: Negative Total Time spent with patient:  1 hour  Past Psychiatric History: It's reported that the patient does not have a past psychiatric history. No previous psychiatric medication no previous hospitalization no history of suicide  attempts.  Risk to Self: Is patient at risk for suicide?: No Risk to Others:   Prior Inpatient Therapy:   Prior Outpatient Therapy:    Alcohol Screening: 1. How often do you have a drink containing alcohol?: Never 9. Have you or someone else been injured as a result of your drinking?: No 10. Has a relative or friend or a doctor or another health worker been concerned about your drinking or suggested you cut down?: No Alcohol Use Disorder Identification Test Final Score (AUDIT): 0 Brief Intervention: Patient declined brief intervention Substance Abuse History in the last 12 months:  No. Consequences of Substance Abuse: Negative Previous Psychotropic Medications: No  Psychological Evaluations: No  Past Medical History:  Past Medical History  Diagnosis Date  . Patient denies medical problems     Past Surgical History  Procedure Laterality Date  . No past surgeries     Family History:  Family History  Problem Relation Age of Onset  . Cancer    . Dementia     Family Psychiatric  History: The patient's sister who died about 3 months ago might of had problems with depression but they're not completely sure. There is no known other known family history of mental illness2 Social History:  History  Alcohol Use No     History  Drug Use No    Social History   Social History  . Marital Status: Married    Spouse Name: N/A  . Number of Children: N/A  . Years of Education: N/A   Social History Main Topics  . Smoking status: Never Smoker   . Smokeless tobacco: None  . Alcohol Use: No  . Drug Use: No  . Sexual Activity: Not Asked     Comment: unable to assess    Other Topics Concern  . None   Social History Narrative   Additional Social History:    Pain Medications:  (na) Prescriptions:  (na) Over the Counter:  (na) History of alcohol / drug use?: No history of alcohol / drug abuse Negative Consequences of Use: Personal relationships Withdrawal Symptoms: Other  (Comment) (na)                    Allergies:  No Known Allergies Lab Results:  Results for orders placed or performed during the hospital encounter of 03/24/15 (from the past 48 hour(s))  Basic metabolic panel     Status: Abnormal   Collection Time: 03/28/15  6:21 AM  Result Value Ref Range   Sodium 142 135 - 145 mmol/L   Potassium 3.8 3.5 - 5.1 mmol/L   Chloride 107 101 - 111 mmol/L   CO2 27 22 - 32 mmol/L   Glucose, Bld 173 (H) 65 - 99 mg/dL   BUN 16 6 - 20 mg/dL   Creatinine, Ser 0.54 0.44 - 1.00 mg/dL   Calcium 9.1 8.9 - 10.3 mg/dL   GFR calc non Af Amer >60 >60 mL/min   GFR calc Af Amer >60 >60 mL/min    Comment: (NOTE) The eGFR has been calculated using the CKD EPI equation. This calculation has not been validated in all clinical situations. eGFR's persistently <60 mL/min signify possible Chronic Kidney Disease.    Anion gap 8 5 - 15    Metabolic Disorder Labs:  No results found for:  HGBA1C, MPG No results found for: PROLACTIN No results found for: CHOL, TRIG, HDL, CHOLHDL, VLDL, LDLCALC  Current Medications: No current facility-administered medications for this encounter.   PTA Medications: No prescriptions prior to admission    Musculoskeletal: Strength & Muscle Tone: decreased Gait & Station: normal Patient leans: N/A  Psychiatric Specialty Exam: Physical Exam  Nursing note and vitals reviewed. Constitutional: She appears well-developed and well-nourished.  HENT:  Head: Normocephalic and atraumatic.  Eyes: Conjunctivae are normal. Pupils are equal, round, and reactive to light.  Neck: Normal range of motion.  Cardiovascular: Normal rate, regular rhythm and normal heart sounds.   Respiratory: Effort normal and breath sounds normal. No respiratory distress.  GI: Soft.  Musculoskeletal: Normal range of motion.  Neurological: She is alert.  Skin: Skin is warm and dry.  Psychiatric: Her affect is blunt. Her speech is delayed. She is slowed.  Thought content is paranoid. Cognition and memory are impaired. She expresses inappropriate judgment. She exhibits abnormal recent memory.    Review of Systems  Constitutional: Negative.   HENT: Negative.   Eyes: Negative.   Respiratory: Negative.   Cardiovascular: Negative.   Gastrointestinal: Negative.   Musculoskeletal: Negative.   Skin: Negative.   Neurological: Negative.   Psychiatric/Behavioral: Positive for depression and memory loss. Negative for suicidal ideas, hallucinations and substance abuse. The patient is nervous/anxious and has insomnia.     Blood pressure 135/85, pulse 88, temperature 98.1 F (36.7 C), temperature source Oral, resp. rate 16, height 5' 4"  (1.626 m), weight 48.535 kg (107 lb), SpO2 98 %.Body mass index is 18.36 kg/(m^2).  General Appearance: Fairly Groomed  Engineer, water::  Fair  Speech:  Slow and Slurred  Volume:  Decreased  Mood:  Dysphoric  Affect:  Flat  Thought Process:  Disorganized  Orientation:  Negative  Thought Content:  Paranoid Ideation  Suicidal Thoughts:  No  Homicidal Thoughts:  No  Memory:  Immediate;   Fair Recent;   Poor Remote;   Poor  Judgement:  Impaired  Insight:  Shallow  Psychomotor Activity:  Decreased  Concentration:  Poor  Recall:  Poor  Fund of Knowledge:Poor  Language: Poor  Akathisia:  No  Handed:  Right  AIMS (if indicated):     Assets:  Physical Health Resilience Social Support  ADL's:  Intact  Cognition: Impaired,  Mild and Moderate  Sleep:        Treatment Plan Summary: Daily contact with patient to assess and evaluate symptoms and progress in treatment, Medication management and Plan Patient has stabilized medically and is being transferred to the psychiatry service. Patient had appeared to have most likely a major depression severe with psychotic features and catatonia. She had been noncompliant with prescribed medication in the hospital and appeared to be not getting any better. Yesterday I had  recommended to the patient and her son that we consider ECT. Son was still considering it as of today. She today she is looking better although she is still not communicating much. She is under IVC. Being transferred to psychiatric ward for further evaluation and treatment. I plan to continue 15 minute checks and continue Remeron and Zyprexa at night as treatment for psychotic depression. I will continue monitoring and have conversation with the family about ECT if that still seems to be appropriate. We will recheck her metabolic panel tomorrow as she had hyponatremia on admission  Observation Level/Precautions:  15 minute checks  Laboratory:  Chemistry Profile  Psychotherapy:  Daily individual and group therapy  and assessment   Medications:  Medication as noted above currently with Remeron and Zyprexa   Consultations:  None at this time   Discharge Concerns:  Confirmation of appropriate social situation and living situation with the family   Estimated LOS: 4-7 days   Other:     I certify that inpatient services furnished can reasonably be expected to improve the patient's condition.   Bertie Simien 12/29/20164:53 PM

## 2015-03-29 NOTE — Tx Team (Signed)
Initial Interdisciplinary Treatment Plan   PATIENT STRESSORS: Health problems Medication change or noncompliance   PATIENT STRENGTHS: Physical Health Supportive family/friends   PROBLEM LIST: Problem List/Patient Goals Date to be addressed Date deferred Reason deferred Estimated date of resolution  Depression  12/29           Malnutrition  12/29                                          DISCHARGE CRITERIA:  Ability to meet basic life and health needs Adequate post-discharge living arrangements  PRELIMINARY DISCHARGE PLAN: Attend aftercare/continuing care group Return to previous living arrangement  PATIENT/FAMIILY INVOLVEMENT: This treatment plan has been presented to and reviewed with the patient, MRYTLE LYKE, and/or family member, .  The patient and family have been given the opportunity to ask questions and make suggestions.  Raul Del 03/29/2015, 5:40 PM

## 2015-03-29 NOTE — Progress Notes (Signed)
[place] [facility] [place] NAME: Sharon Woods    MR#:  [id number]  DATE OF BIRTH:  February 13, 1961  SUBJECTIVE; patient is seen today.  is alert oriented and had a good breakfast. Communicating well. Walked with physical therapy..   CHIEF COMPLAINT:   Chief Complaint  Patient presents with  . Psychiatric Evaluation   - REVIEW OF SYSTEMS: not talking today and unable to get ROS   Review of Systems  Constitutional: Negative for fever and chills.  HENT: Negative for ear discharge, ear pain, hearing loss and nosebleeds.   Eyes: Negative for blurred vision, double vision and photophobia.  Respiratory: Negative for cough, hemoptysis, shortness of breath and wheezing.   Cardiovascular: Negative for chest pain, palpitations, orthopnea and leg swelling.  Gastrointestinal: Negative for nausea, vomiting, abdominal pain, diarrhea and constipation.  Genitourinary: Negative for dysuria and urgency.  Musculoskeletal: Negative for myalgias and neck pain.  Skin: Negative for rash.  Neurological: Negative for dizziness, speech change, focal weakness, seizures, weakness and headaches.  Psychiatric/Behavioral: Negative for depression and memory loss. The patient does not have insomnia.     DRUG ALLERGIES:  No Known Allergies  VITALS:  Blood pressure 115/80, pulse 72, temperature 97.8 F (36.6 C), temperature source Oral, resp. rate 18, height 5\' 4"  (1.626 m), weight 48.671 kg (107 lb 4.8 oz), SpO2 98 %.  PHYSICAL EXAMINATION:  Physical Exam  GENERAL:  54 y.o.-year-old patient lying in the bed with no acute distress. More alert and communicating today. Sitter at bedside. EYES: Pupils equal, round, reactive to light and accommodation. No scleral icterus. Extraocular muscles intact.  HEENT: Head atraumatic, normocephalic. Oropharynx and nasopharynx clear.  NECK:  Supple, no jugular venous distention. No thyroid enlargement, no tenderness.  LUNGS: Normal  breath sounds bilaterally, no wheezing, rales,rhonchi or crepitation. No use of accessory muscles of respiration.  CARDIOVASCULAR: S1, S2 normal. No murmurs, rubs, or gallops.  ABDOMEN: Soft, nontender, nondistended. Scaphoid abdomen. Bowel sounds present. No organomegaly or mass.  EXTREMITIES: No pedal edema, cyanosis, or clubbing.  NEUROLOGIC: Cranial nerves II through XII are intact. Muscle strength 5/5 in all extremities. Sensation intact. Gait not checked.  PSYCHIATRIC: The patient is alert and following commands. She is oriented 3 SKIN: No obvious rash, lesion, or ulcer.    LABORATORY PANEL:   CBC  Recent Labs Lab 03/27/15 0440  WBC 6.8  HGB 13.9  HCT 41.5  PLT 110*   ------------------------------------------------------------------------------------------------------------------  Chemistries   Recent Labs Lab 03/26/15 0519  03/27/15 0440 03/28/15 0621  NA 142  < > 142 142  K 3.1*  --  3.1* 3.8  CL 104  --  100* 107  CO2 29  --  33* 27  GLUCOSE 159*  --  141* 173*  BUN 10  --  15 16  CREATININE 0.51  --  0.75 0.54  CALCIUM 9.1  --  9.3 9.1  MG 1.7  --   --   --   AST  --   --  31  --   ALT  --   --  28  --   ALKPHOS  --   --  40  --   BILITOT  --   --  0.8  --   < > = values in this interval not displayed. ------------------------------------------------------------------------------------------------------------------  Cardiac Enzymes  Recent Labs Lab 03/24/15 2326  TROPONINI <0.03   ------------------------------------------------------------------------------------------------------------------  RADIOLOGY:  No results found.  EKG:   Orders placed or  performed during the hospital encounter of 03/24/15  . EKG 12-Lead  . EKG 12-Lead    ASSESSMENT AND PLAN:   54 year old female with unknown past medical history at this time presents to the hospital secondary to poor by mouth intake and failure to thrive. Symptoms have started after her  sister's death a few months ago.  #1 Hypernatremia-improved/  #2 hypokalemia-replaced.    #3 urinary tract infection- continue Rocephin for now. Stop date is 03/29/2015  #4 major depression, severe, single episode-with failure to thrive and self-neglect -Presented with catatonia features. End is alert, awake, oriented. Had good breakfast. Communicating well. I spoke to Dr. Weber Cooks.  patient is stable for transfer to behavioral health unit. -Appreciate psychiatric consult. Patient started on Remeron at bedtime and also Zyprexa.  - #5 DVT prophylaxis-on Lovenox - patient refusing to take    All the records are reviewed and case discussed with Care Management/Social Workerr. Management plans discussed with the patient, family and they are in agreement.  CODE STATUS: Full Code  TOTAL TIME TAKING CARE OF THIS PATIENT: 39 minutes.   POSSIBLE D/C IN 1-2 DAYS, DEPENDING ON CLINICAL CONDITION.   Epifanio Lesches M.D on 03/29/2015 at 12:14 PM  Between 7am to 6pm - Pager - [phone]  After 6pm go to [email or web address] - password [email or web address]  [facility]  Office  [phone]  CC: Primary care physician; No primary care provider on file.

## 2015-03-30 LAB — BASIC METABOLIC PANEL
ANION GAP: 4 — AB (ref 5–15)
BUN: 19 mg/dL (ref 6–20)
CALCIUM: 9.5 mg/dL (ref 8.9–10.3)
CO2: 31 mmol/L (ref 22–32)
Chloride: 103 mmol/L (ref 101–111)
Creatinine, Ser: 0.53 mg/dL (ref 0.44–1.00)
Glucose, Bld: 138 mg/dL — ABNORMAL HIGH (ref 65–99)
Potassium: 3.5 mmol/L (ref 3.5–5.1)
SODIUM: 138 mmol/L (ref 135–145)

## 2015-03-30 NOTE — Progress Notes (Signed)
Patient affect is flat and voice is soft. She notes feeling anxious and lonely but chooses to remain in her room. She says, "People don't like me because of this", referring to her yellow fall risk band. She was not willing to be reassured. She refused her medications, even after significant encouragement (approx 30 dedicated minutes to medications). Her level of orientation is unclear as she is only minimally responsive to assessment questions.

## 2015-03-30 NOTE — Plan of Care (Signed)
Problem: Alteration in mood Goal: LTG-Pt's behavior demonstrates decreased signs of depression (Patient's behavior demonstrates decreased signs of depression to the point the patient is safe to return home and continue treatment in an outpatient setting)  Outcome: Progressing Pt out of room and eating / drinking appropriately.  Goal: STG-Patient is able to discuss feelings and issues (Patient is able to discuss feelings and issues leading to depression)  Outcome: Progressing Pt able to state she is lonely and anxious, but does not know or is unable to voice what led her to depression.

## 2015-03-30 NOTE — BHH Group Notes (Signed)
[facility] LCSW Aftercare Discharge Planning Group Note   03/30/2015 9:15 AM  ?  Participation Quality: No participation  Mood/Affect: Depressed and Flat   Depression Rating: Pt did not report  Anxiety Rating: Pt did not report  Thoughts of Suicide: Pt denies SI/HI   Will you contract for safety? Yes   Current AVH: Pt denies   Plan for Discharge/Comments: Pt attended discharge planning group but was not able to participate in group. CSW provided pt with today's workbook.  Pt had difficulty speaking and did not share.  Pt was polite and cooperative with the CSW and other group members.   Transportation Means: Pt did not report access to transportation   Supports: No supports mentioned at this time     Alphonse Guild. Jazmin Vensel, MSW, LCSWA, LCAS

## 2015-03-30 NOTE — H&P (Signed)
Psychiatric Admission Assessment Adult  Patient Identification: Sharon Woods MRN:  [id number] Date of Evaluation:  03/30/2015 Chief Complaint:  major depression Principal Diagnosis: Severe major depression with psychotic features, mood-congruent ([place]) Diagnosis:   Patient Active Problem List   Diagnosis Date Noted  . Severe major depression with psychotic features, mood-congruent ([place]) [F32.3]     Priority: High  . Major depressive single episode severe with psychosis ([place]) [F32.3] 03/29/2015  . Severe major depression, single episode, with psychotic features, mood-congruent ([place]) [F32.3] 03/29/2015  . Protein-calorie malnutrition, severe [E43] 03/26/2015  . Depression, major, single episode, severe ([place]) [F32.2] 03/26/2015  . 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 Hypernatremia [E87.0] 03/25/2015  . UTI (lower urinary tract infection) [N39.0] 03/25/2015   History of Present Illness:: This is a 54 year old woman new to our system transferred from the medical floor. Patient had initially presented to the hospital with probably 3 weeks or more of not eating and not consuming much liquid not sleeping poor self-care. She was initially not able to give a history and appeared to be partially catatonic. Son was present at that time and reported that she had become much more depressed and withdrawn in the months since her sister had died. Patient had not been actively suicidal. She did seem at times to be responding to internal stimuli and when I interview her today is able to say that she does hear strange noises at times. She still reports that her mood is bad. Denies any active plan to kill her self. Patient is not a very good historian she has improved mentally but is still pretty withdrawn and seems confused at times. There is no history of substance abuse. Patient was initially resistant to medicine but has been now taking oral Zyprexa and Remeron as ordered. I had suggested that we consider ECT  when she was not eating but we have table that the moment. Associated Signs/Symptoms: Depression Symptoms:  depressed mood, anhedonia, insomnia, psychomotor retardation, difficulty concentrating, hopelessness, impaired memory, recurrent thoughts of death, loss of energy/fatigue, disturbed sleep, (Hypo) Manic Symptoms:  None Anxiety Symptoms:  Excessive Worry, Psychotic Symptoms:  Delusions, Hallucinations: Auditory Paranoia, PTSD Symptoms: Negative Total Time spent with patient: 1 hour  Past Psychiatric History: Patient denies having had psychiatric treatment in the past. No known past history of psychiatric treatment no known history of suicide attempts or psychiatric medicine  Risk to Self: Is patient at risk for suicide?: No Risk to Others:   Prior Inpatient Therapy:   Prior Outpatient Therapy:    Alcohol Screening: 1. How often do you have a drink containing alcohol?: Never 9. Have you or someone else been injured as a result of your drinking?: No 10. Has a relative or friend or a doctor or another health worker been concerned about your drinking or suggested you cut down?: No Alcohol Use Disorder Identification Test Final Score (AUDIT): 0 Brief Intervention: Patient declined brief intervention Substance Abuse History in the last 12 months:  No. Consequences of Substance Abuse: Negative Previous Psychotropic Medications: No  Psychological Evaluations: No  Past Medical History:  Past Medical History  Diagnosis Date  . Patient denies medical problems     Past Surgical History  Procedure Laterality Date  . No past surgeries     Family History:  Family History  Problem Relation Age of Onset  . Cancer    . Dementia     Family Psychiatric  History: Her sister who passed away 3 months ago may have had depression  in the history is a little unclear. Social History:  History  Alcohol Use No     History  Drug Use No    Social History   Social History  .  Marital Status: Married    Spouse Name: N/A  . Number of Children: N/A  . Years of Education: N/A   Social History Main Topics  . Smoking status: Never Smoker   . Smokeless tobacco: None  . Alcohol Use: No  . Drug Use: No  . Sexual Activity: Not Asked     Comment: unable to assess    Other Topics Concern  . None   Social History Narrative   Additional Social History:    Pain Medications:  (na) Prescriptions:  (na) Over the Counter:  (na) History of alcohol / drug use?: No history of alcohol / drug abuse Negative Consequences of Use: Personal relationships Withdrawal Symptoms: Other (Comment) (na)                    Allergies:  No Known Allergies Lab Results:  Results for orders placed or performed during the hospital encounter of 03/29/15 (from the past 48 hour(s))  Basic metabolic panel     Status: Abnormal   Collection Time: 03/30/15  8:10 AM  Result Value Ref Range   Sodium 138 135 - 145 mmol/L   Potassium 3.5 3.5 - 5.1 mmol/L   Chloride 103 101 - 111 mmol/L   CO2 31 22 - 32 mmol/L   Glucose, Bld 138 (H) 65 - 99 mg/dL   BUN 19 6 - 20 mg/dL   Creatinine, Ser 0.53 0.44 - 1.00 mg/dL   Calcium 9.5 8.9 - 10.3 mg/dL   GFR calc non Af Amer >60 >60 mL/min   GFR calc Af Amer >60 >60 mL/min    Comment: (NOTE) The eGFR has been calculated using the CKD EPI equation. This calculation has not been validated in all clinical situations. eGFR's persistently <60 mL/min signify possible Chronic Kidney Disease.    Anion gap 4 (L) 5 - 15    Metabolic Disorder Labs:  No results found for: HGBA1C, MPG No results found for: PROLACTIN No results found for: CHOL, TRIG, HDL, CHOLHDL, VLDL, LDLCALC  Current Medications: Current Facility-Administered Medications  Medication Dose Route Frequency Provider Last Rate Last Dose  . acetaminophen (TYLENOL) tablet 650 mg  650 mg Oral Q6H PRN Gonzella Lex, MD      . alum & mag hydroxide-simeth (MAALOX/MYLANTA) 200-200-20 MG/5ML  suspension 30 mL  30 mL Oral Q4H PRN Gonzella Lex, MD      . feeding supplement (ENSURE ENLIVE) (ENSURE ENLIVE) liquid 237 mL  237 mL Oral TID BM Jolanta B Pucilowska, MD   237 mL at 03/30/15 1403  . magnesium hydroxide (MILK OF MAGNESIA) suspension 30 mL  30 mL Oral Daily PRN Gonzella Lex, MD      . mirtazapine (REMERON SOL-TAB) disintegrating tablet 30 mg  30 mg Oral QHS Gonzella Lex, MD   30 mg at 03/29/15 2137  . OLANZapine zydis (ZYPREXA) disintegrating tablet 10 mg  10 mg Oral QHS Gonzella Lex, MD   10 mg at 03/29/15 2137   PTA Medications: No prescriptions prior to admission    Musculoskeletal: Strength & Muscle Tone: within normal limits Gait & Station: normal Patient leans: N/A  Psychiatric Specialty Exam: Physical Exam  Nursing note and vitals reviewed. Constitutional: She appears well-developed and well-nourished.  HENT:  Head: Normocephalic and atraumatic.  Eyes: Conjunctivae are normal. Pupils are equal, round, and reactive to light.  Neck: Normal range of motion.  Cardiovascular: Normal rate, regular rhythm and normal heart sounds.   Respiratory: Effort normal and breath sounds normal. No respiratory distress.  GI: Soft.  Musculoskeletal: Normal range of motion.  Neurological: She is alert.  Skin: Skin is warm and dry.  Psychiatric: Her affect is blunt. Her speech is delayed. She is slowed. Thought content is delusional. Cognition and memory are impaired. She expresses inappropriate judgment. She exhibits a depressed mood.    Review of Systems  Constitutional: Negative.   HENT: Negative.   Eyes: Negative.   Respiratory: Negative.   Cardiovascular: Negative.   Gastrointestinal: Negative.   Musculoskeletal: Negative.   Skin: Negative.   Neurological: Negative.   Psychiatric/Behavioral: Positive for depression, hallucinations and memory loss. Negative for suicidal ideas and substance abuse. The patient is nervous/anxious and has insomnia.     Blood  pressure 125/84, pulse 91, temperature 98.1 F (36.7 C), temperature source Oral, resp. rate 16, height 5' 4"  (1.626 m), weight 48.535 kg (107 lb), SpO2 98 %.Body mass index is 18.36 kg/(m^2).  General Appearance: Disheveled  Eye Sport and exercise psychologist::  Fair  Speech:  Slow and Slurred  Volume:  Decreased  Mood:  Depressed  Affect:  Flat  Thought Process:  Tangential  Orientation:  Other:  Oriented to being in the hospital and being in [place] specific date and explanation of the situation are somewhat vague.  Thought Content:  Hallucinations: Auditory and Paranoid Ideation  Suicidal Thoughts:  Yes.  without intent/plan  Homicidal Thoughts:  No  Memory:  Immediate;   Fair Recent;   Poor Remote;   Fair  Judgement:  Impaired  Insight:  Shallow  Psychomotor Activity:  Decreased  Concentration:  Poor  Recall:  Poor  Fund of Knowledge:Poor  Language: Poor  Akathisia:  No  Handed:  Right  AIMS (if indicated):     Assets:  Physical Health Resilience Social Support  ADL's:  Impaired  Cognition: Impaired,  Mild  Sleep:  Number of Hours: 0     Treatment Plan Summary: Daily contact with patient to assess and evaluate symptoms and progress in treatment, Medication management and Plan Patient is admitted to the hospital for presumptive diagnosis of severe major depression with psychotic features. Partially catatonic. Seems to of had some improvement and is now eating and drinking a little bit. Transferred to the psychiatric ward. Patient continues today to appear quite confused. She also had a bowel movement in her pants and seems confused about it. She wonders around the ward in something of a days. I have spoken to her son before and I'm pretty sure that her baseline is better functioning than this. I talked with her today and encouraged her to please take Zyprexa and Remeron. I will continue to follow-up and will be here over the weekend. If necessary we have Artie brought up the subject of ECT  but it would not be available until next week anyway. We will need to make sure we come up with an appropriate discharge plan and that she has a safe place to live  Observation Level/Precautions:  Fall 15 minute checks  Laboratory:  Chemistry Profile UDS  Psychotherapy:  Daily individual and group therapy   Medications:  Current medicines Remeron and Zyprexa   Consultations:  None at this point   Discharge Concerns:  Safe living situation   Estimated LOS: 7-10 days   Other:  I certify that inpatient services furnished can reasonably be expected to improve the patient's condition.   John Clapacs 12/30/20166:15 PM

## 2015-03-30 NOTE — Progress Notes (Signed)
Initial Nutrition Assessment  DOCUMENTATION CODES:   Severe malnutrition in context of social or environmental circumstances  INTERVENTION:   Meals and Snacks: Cater to patient preferences Medical Food Supplement Therapy: Agree with Ensure Enlive po TID, each supplement provides 350 kcal and 20 grams of protein as pt was drinking on medical floor   NUTRITION DIAGNOSIS:   Malnutrition related to social / environmental circumstances as evidenced by severe depletion of muscle mass, severe depletion of body fat, energy intake < 75% for > or equal to 3 months, percent weight loss.  GOAL:   Patient will meet greater than or equal to 90% of their needs  MONITOR:    (Energy Intake, Anthropometrics, Digestive System, Electrolyte and Renal Profile)  REASON FOR ASSESSMENT:   Malnutrition Screening Tool    ASSESSMENT:   Pt admitted to inpatient acute care with hypernatremia and dehydration. Pt sister passed a few months ago and since has had very poor po intake and finanical stress.  Pt now admitted to behavioral health unit 12/30.  Past Medical History  Diagnosis Date  . Patient denies medical problems     Diet Order:  Diet regular Room service appropriate?: Yes; Fluid consistency:: Thin    Current Nutrition: Pt now admitted to [facility] from Medical floor. RD rounded on patient before transfer to unit. Pt with improved appetite since first assessed on 12/26. Per Nsg, CNA Beverlee Nims was able to hand pt chicken nuggets and french fries on dinner on 12/28 and pt ate very well. Recorded po intake 49% of meals since first admitted on 12/25. Pt eating cookies and sweets well per Nsg. Pt also drinking Ensures.  Food/Nutrition-Related History: See note on 12/26 for nutrition history PTA   Scheduled Medications:  . feeding supplement (ENSURE ENLIVE)  237 mL Oral TID BM  . mirtazapine  30 mg Oral QHS  . OLANZapine zydis  10 mg Oral QHS     Electrolyte/Renal Profile and Glucose Profile:    Recent Labs Lab 03/24/15 2326 03/25/15 0437 03/25/15 1050  03/26/15 0519  03/27/15 0440 03/28/15 0621 03/30/15 0810  NA 153* 155* 151*  < > 142  < > 142 142 138  K 3.5 2.9*  --   < > 3.1*  --  3.1* 3.8 3.5  CL 110 114*  --   < > 104  --  100* 107 103  CO2 22 28  --   < > 29  --  33* 27 31  BUN 31* 24*  --   < > 10  --  15 16 19   CREATININE 0.99 0.71  --   < > 0.51  --  0.75 0.54 0.53  CALCIUM 10.5* 9.1  --   < > 9.1  --  9.3 9.1 9.5  MG 2.4  --  2.1  --  1.7  --   --   --   --   PHOS  --  2.1* 2.1*  --  2.1*  --   --   --   --   GLUCOSE 160* 117*  --   < > 159*  --  141* 173* 138*  < > = values in this interval not displayed. Protein Profile:  Recent Labs Lab 03/24/15 2326 03/27/15 0440  ALBUMIN 4.8 3.6    Gastrointestinal Profile: Last BM:   Nutrition-Focused Physical Exam Findings: Nutrition-Focused physical exam completed on 12/26. Findings are mild-severe fat depletion, moderate-severe muscle depletion, and no edema.    Weight Change: No weight gain or  loss since admission 12/25. Per note on 12/26:  Difficult to clarify with pt. Per MST pt with greater than 34lbs weight loss (24% weight loss if 34lbs lost) per RN consult 50lbs weight loss since August (32% weight loss in 4 months)   Height:   Ht Readings from Last 1 Encounters:  03/29/15 5\' 4"  (1.626 m)    Weight:   Wt Readings from Last 1 Encounters:  03/29/15 107 lb (48.535 kg)     BMI:  Body mass index is 18.36 kg/(m^2).  Estimated Nutritional Needs:   Kcal:  BEE: 1077kcals, TEE: (IF 1.1-1.3)(AF 1.3) 1540-1820kcals  Protein:  48-58g protein (1.0-1.2g/kg)  Fluid:  1461-1731mL of fluid (25-8mL/kg)  EDUCATION NEEDS:   No education needs identified at this time   [place], RD, LDN Pager [phone] Weekend/On-Call Pager [phone]

## 2015-03-30 NOTE — BHH Group Notes (Signed)
[facility] Group Notes:  (Nursing/MHT/Case Management/Adjunct)  Date:  03/30/2015  Time:  11:48 AM  Type of Therapy:  Psychoeducational Skills  Participation Level:  Minimal  Participation Quality:  Resistant  Affect:  Flat and Resistant  Cognitive:  Lacking  Insight:  Limited  Engagement in Group:  Poor and Resistant  Modes of Intervention:  Discussion, Education and Support  Summary of Progress/Problems:  Adela Lank [facility] 03/30/2015, 11:48 AM

## 2015-03-30 NOTE — Progress Notes (Signed)
Patient ID: Sharon Woods, female   DOB: 01/02/1961, 54 y.o.   MRN: [id number] CSW attempted PSA, pt was unable to complete due to speaking difficulties.

## 2015-03-30 NOTE — Progress Notes (Signed)
Patient did not sleep and spent a good portion of the night standing in her doorway. Attempts to have her lie down were unsuccessful. Pt declined to state any needs or what would help her rest.

## 2015-03-30 NOTE — BHH Suicide Risk Assessment (Signed)
[facility] Admission Suicide Risk Assessment   Nursing information obtained from:    review of current nursing notes Demographic factors:    patient is somewhat socially isolated female as some family support Current Mental Status:    withdrawn and depressed some suicidal ideation passive confused Loss Factors:    recent loss of her sister Historical Factors:    no known past psychiatric history Risk Reduction Factors:    concern for the family Total Time spent with patient: 1 hour Principal Problem: Severe major depression with psychotic features, mood-congruent ([place]) Diagnosis:   Patient Active Problem List   Diagnosis Date Noted  . Severe major depression with psychotic features, mood-congruent ([place]) [F32.3]     Priority: High  . Major depressive single episode severe with psychosis ([place]) [F32.3] 03/29/2015  . Severe major depression, single episode, with psychotic features, mood-congruent ([place]) [F32.3] 03/29/2015  . Protein-calorie malnutrition, severe [E43] 03/26/2015  . Depression, major, single episode, severe ([place]) [F32.2] 03/26/2015  . 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 Hypernatremia [E87.0] 03/25/2015  . UTI (lower urinary tract infection) [N39.0] 03/25/2015     Continued Clinical Symptoms:  Alcohol Use Disorder Identification Test Final Score (AUDIT): 0 The "Alcohol Use Disorders Identification Test", Guidelines for Use in Primary Care, Second Edition.  World Pharmacologist [facility]). Score between 0-7:  no or low risk or alcohol related problems. Score between 8-15:  moderate risk of alcohol related problems. Score between 16-19:  high risk of alcohol related problems. Score 20 or above:  warrants further diagnostic evaluation for alcohol dependence and treatment.   CLINICAL FACTORS:   Depression:   Anhedonia Hopelessness Insomnia   Musculoskeletal: Strength & Muscle Tone: within normal limits Gait & Station: normal Patient leans: N/A  Psychiatric Specialty  Exam: Physical Exam  ROS  Blood pressure 125/84, pulse 91, temperature 98.1 F (36.7 C), temperature source Oral, resp. rate 16, height 5\' 4"  (1.626 m), weight 48.535 kg (107 lb), SpO2 98 %.Body mass index is 18.36 kg/(m^2).  General Appearance: Disheveled  Eye Sport and exercise psychologist::  Fair  Speech:  Slow and Slurred  Volume:  Decreased  Mood:  Depressed  Affect:  Flat  Thought Process:  Tangential  Orientation:  Full (Time, Place, and Person)  Thought Content:  Hallucinations: Auditory  Suicidal Thoughts:  Yes.  without intent/plan  Homicidal Thoughts:  No  Memory:  Immediate;   Fair Recent;   Poor Remote;   Poor  Judgement:  Impaired  Insight:  Shallow  Psychomotor Activity:  Restlessness  Concentration:  Poor  Recall:  Poor  Fund of Knowledge:Poor  Language: Poor  Akathisia:  No  Handed:  Right  AIMS (if indicated):     Assets:  Housing Physical Health Resilience  Sleep:  Number of Hours: 0  Cognition: Impaired,  Mild  ADL's:  Impaired     COGNITIVE FEATURES THAT CONTRIBUTE TO RISK:  Loss of executive function    SUICIDE RISK:   Moderate:  Frequent suicidal ideation with limited intensity, and duration, some specificity in terms of plans, no associated intent, good self-control, limited dysphoria/symptomatology, some risk factors present, and identifiable protective factors, including available and accessible social support.  PLAN OF CARE: Patient has some risk of suicide given current suicidal ideation and diagnosis of severe depression. She does not have a history of suicide attempts. She does not appear to be making any effort to try and harm herself. She is still somewhat catatonic and cognitively impaired. Still requires hospitalization and close monitoring and treatment  Medical Decision Making:  Review of Psycho-Social Stressors (1), Review or order clinical lab tests (1), Discuss test with performing physician (1) and Review of Medication Regimen & Side Effects (2)  I  certify that inpatient services furnished can reasonably be expected to improve the patient's condition.   Jansen Goodpasture 03/30/2015, 6:23 PM

## 2015-03-30 NOTE — Progress Notes (Signed)
Pt appears to be responding to internal stimuli at times, flat/blank affect, stares at staff appearing confused, incoherent responses to staff questions, while in dayroom this evening pt had episode of bowel incontinence, staff assisted pt to clean self up in bathroom, pt isolates and is suspicious/paranoid of staff and medications, denies SI/HI/AVH.

## 2015-03-30 NOTE — Plan of Care (Signed)
Problem: Alteration in mood Goal: LTG-Patient reports reduction in suicidal thoughts (Patient reports reduction in suicidal thoughts and is able to verbalize a safety plan for whenever patient is feeling suicidal)  Outcome: Not Progressing Pt does not directly respond to assessment questions.  Goal: LTG-Pt's behavior demonstrates decreased signs of depression (Patient's behavior demonstrates decreased signs of depression to the point the patient is safe to return home and continue treatment in an outpatient setting)  Outcome: Not Progressing Pt not able to participate in the milieu or in the assessment process.  Goal: STG-Patient is able to discuss feelings and issues (Patient is able to discuss feelings and issues leading to depression)  Outcome: Not Progressing Pt not able to participate in discussion. Goal: STG-Patient reports thoughts of self-harm to staff Outcome: Not Progressing Pt not able to respond to assessment questions.

## 2015-03-31 NOTE — Progress Notes (Signed)
Patient difficult to communicate with. The patient states that she is fine but will offer no other details. Patient inquires about when she will be able to go home. Patient appears confused and stares off into space when spoken to. No unsafe behaviors noted will continue to monitor.

## 2015-03-31 NOTE — Progress Notes (Signed)
Patient is not able to participate in the assessment process. She is present in the milieu but does not interact with her peers. She is frequently found to be staring at staff and other patients, but does not readily respond to attempts at interaction. She continues to refuse medications and is not readily sleeping. No needs are voiced. Patient is monitored q15 for safety and will be offered assistance regularly.

## 2015-03-31 NOTE — Progress Notes (Signed)
[facility] MD Progress Note  03/31/2015 1:19 PM Sharon Woods  MRN:  [id number] Subjective:  54 year old woman with new presentation of symptoms that seem most consistent with severe psychotic depression. Patient has been transferred from the medical service. She is difficult to work with because of difficulties in communication with her. After several minutes of gentle encouragement the patient did speak to me a little bit today. She told me that she felt sad and she felt like people were not listening to her and were thinking badly of her. She became very tearful. Was not able to give a lot of other detail. Denied having thoughts about actually killing yourself admits to having some suicidal ideation. Patient does not report specific new physical complaints Principal Problem: Severe major depression with psychotic features, mood-congruent ([place]) Diagnosis:   Patient Active Problem List   Diagnosis Date Noted  . Severe major depression with psychotic features, mood-congruent ([place]) [F32.3]     Priority: High  . Major depressive single episode severe with psychosis ([place]) [F32.3] 03/29/2015  . Severe major depression, single episode, with psychotic features, mood-congruent ([place]) [F32.3] 03/29/2015  . Protein-calorie malnutrition, severe [E43] 03/26/2015  . Depression, major, single episode, severe ([place]) [F32.2] 03/26/2015  . 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 Hypernatremia [E87.0] 03/25/2015  . UTI (lower urinary tract infection) [N39.0] 03/25/2015   Total Time spent with patient: 35 minutes  Past Psychiatric History: Unknown but apparently minimal to no past psychiatric history as identified  Past Medical History:  Past Medical History  Diagnosis Date  . Patient denies medical problems     Past Surgical History  Procedure Laterality Date  . No past surgeries     Family History:  Family History  Problem Relation Age of Onset  . Cancer    . Dementia     Family Psychiatric  History:  Possible depression in the family but also there are details lacking Social History:  History  Alcohol Use No     History  Drug Use No    Social History   Social History  . Marital Status: Married    Spouse Name: N/A  . Number of Children: N/A  . Years of Education: N/A   Social History Main Topics  . Smoking status: Never Smoker   . Smokeless tobacco: None  . Alcohol Use: No  . Drug Use: No  . Sexual Activity: Not Asked     Comment: unable to assess    Other Topics Concern  . None   Social History Narrative   Additional Social History:    Pain Medications:  (na) Prescriptions:  (na) Over the Counter:  (na) History of alcohol / drug use?: No history of alcohol / drug abuse Negative Consequences of Use: Personal relationships Withdrawal Symptoms: Other (Comment) (na)                    Sleep: Poor  Appetite:  Poor  Current Medications: Current Facility-Administered Medications  Medication Dose Route Frequency Provider Last Rate Last Dose  . acetaminophen (TYLENOL) tablet 650 mg  650 mg Oral Q6H PRN Gonzella Lex, MD      . alum & mag hydroxide-simeth (MAALOX/MYLANTA) 200-200-20 MG/5ML suspension 30 mL  30 mL Oral Q4H PRN Gonzella Lex, MD      . feeding supplement (ENSURE ENLIVE) (ENSURE ENLIVE) liquid 237 mL  237 mL Oral TID BM Jolanta B Pucilowska, MD   237 mL at 03/31/15 1000  . magnesium hydroxide (MILK OF  MAGNESIA) suspension 30 mL  30 mL Oral Daily PRN Gonzella Lex, MD      . mirtazapine (REMERON SOL-TAB) disintegrating tablet 30 mg  30 mg Oral QHS Gonzella Lex, MD   30 mg at 03/29/15 2137  . OLANZapine zydis (ZYPREXA) disintegrating tablet 10 mg  10 mg Oral QHS Gonzella Lex, MD   10 mg at 03/29/15 2137    Lab Results:  Results for orders placed or performed during the hospital encounter of 03/29/15 (from the past 48 hour(s))  Basic metabolic panel     Status: Abnormal   Collection Time: 03/30/15  8:10 AM  Result Value Ref Range   Sodium  138 135 - 145 mmol/L   Potassium 3.5 3.5 - 5.1 mmol/L   Chloride 103 101 - 111 mmol/L   CO2 31 22 - 32 mmol/L   Glucose, Bld 138 (H) 65 - 99 mg/dL   BUN 19 6 - 20 mg/dL   Creatinine, Ser 0.53 0.44 - 1.00 mg/dL   Calcium 9.5 8.9 - 10.3 mg/dL   GFR calc non Af Amer >60 >60 mL/min   GFR calc Af Amer >60 >60 mL/min    Comment: (NOTE) The eGFR has been calculated using the CKD EPI equation. This calculation has not been validated in all clinical situations. eGFR's persistently <60 mL/min signify possible Chronic Kidney Disease.    Anion gap 4 (L) 5 - 15    Physical Findings: AIMS: Facial and Oral Movements Muscles of Facial Expression: None, normal Lips and Perioral Area: None, normal Jaw: None, normal Tongue: None, normal,Extremity Movements Upper (arms, wrists, hands, fingers): None, normal Lower (legs, knees, ankles, toes): None, normal, Trunk Movements Neck, shoulders, hips: None, normal, Overall Severity Severity of abnormal movements (highest score from questions above): None, normal Incapacitation due to abnormal movements: None, normal Patient's awareness of abnormal movements (rate only patient's report): Aware, no distress, Dental Status Current problems with teeth and/or dentures?: No Does patient usually wear dentures?: No  CIWA:  CIWA-Ar Total: 2 COWS:  COWS Total Score: 1  Musculoskeletal: Strength & Muscle Tone: decreased Gait & Station: normal Patient leans: N/A  Psychiatric Specialty Exam: Review of Systems  Constitutional: Positive for weight loss.  HENT: Negative.   Eyes: Negative.   Respiratory: Negative.   Cardiovascular: Negative.   Gastrointestinal: Negative.   Musculoskeletal: Negative.   Skin: Negative.   Neurological: Negative.   Psychiatric/Behavioral: Positive for depression, suicidal ideas and hallucinations. Negative for memory loss and substance abuse. The patient is nervous/anxious and has insomnia.     Blood pressure 134/87, pulse 68,  temperature 98.1 F (36.7 C), temperature source Oral, resp. rate 18, height 5' 4"  (1.626 m), weight 48.535 kg (107 lb), SpO2 98 %.Body mass index is 18.36 kg/(m^2).  General Appearance: Disheveled  Eye Contact::  Minimal  Speech:  Garbled and Slow  Volume:  Decreased  Mood:  Depressed and Dysphoric  Affect:  Depressed and Tearful  Thought Process:  Tangential  Orientation:  Full (Time, Place, and Person)  Thought Content:  Paranoid Ideation  Suicidal Thoughts:  Yes.  without intent/plan  Homicidal Thoughts:  No  Memory:  Immediate;   Fair Recent;   Fair Remote;   Fair  Judgement:  Impaired  Insight:  Shallow  Psychomotor Activity:  Psychomotor Retardation  Concentration:  Poor  Recall:  Poor  Fund of Knowledge:Fair  Language: Fair  Akathisia:  No  Handed:  Right  AIMS (if indicated):     Assets:  Physical Health Social Support  ADL's:  Impaired  Cognition: Impaired,  Mild  Sleep:  Number of Hours: 2.25   Treatment Plan Summary: Daily contact with patient to assess and evaluate symptoms and progress in treatment, Medication management and Plan Patient is a 54 year old woman who presented with symptoms most consistent with severe psychotic depression. Patient continues to be very impaired. Her speech tends to be somewhat disorganized and telegraphic at best. Her affect is sad and tearful withdrawn. She just seems to be nervous and frightened all the time. She is not really compliant with her medication and is eating only a little bit. Still sleeping only a little bit and doesn't look tired during the day. Case reviewed with nursing and social work today. Continue current medicine. I am going to request a psychology consult just to make sure that we are not overlooking a baseline cognitive impairment. We will also follow-up with family. I have brought up the proposal for ECT with the son and the last I heard he was still considering it. With her current symptoms that would certainly  still be an option on the table we could start on Wednesday if it were appropriate and she was agreeable.Medically, I am going to order a hemoglobin A1c. It looks as though that might of been overlooked and the patient has been running some higher blood sugars. probably not directly related to the Zyprexa since she hasn't been very compliant with it.   John Clapacs 03/31/2015, 1:19 PM

## 2015-04-01 LAB — HEMOGLOBIN A1C: HEMOGLOBIN A1C: 5.4 % (ref 4.0–6.0)

## 2015-04-01 MED ORDER — TEMAZEPAM 15 MG PO CAPS
15.0000 mg | ORAL_CAPSULE | Freq: Every day | ORAL | Status: DC
  Filled 2015-04-01: qty 1

## 2015-04-01 NOTE — Plan of Care (Signed)
Problem: Diagnosis: Increased Risk For Suicide Attempt Goal: STG-Patient Will Comply With Medication Regime Outcome: Progressing Patient took HS medication

## 2015-04-01 NOTE — Progress Notes (Signed)
Patient disorgnized, confused and required multiple prompts to got to the day room for meals. Encouraged pt. To attend groups but when she got to the door, she turned around and walked back to her room. Patient noted to be paranoid and suspicious. She did not eat her breakfast but drank her ensure this morning.

## 2015-04-01 NOTE — Progress Notes (Signed)
D:Patient exhibits some bizarre behavior. She denies SI/HI/AVH but is reacting to some internal stimuli. She has some very paranoid behaviors. She denies pain.  A:Medication was given with education. Encouragement was provided to take medication. Constant redirection was done with the patient.  R:Patient was compliant with medication. She has been calm an cooperative. Safety maintained with 15 min checks.

## 2015-04-01 NOTE — Progress Notes (Signed)
Patient ID: Sharon Woods, female   DOB: Jan 06, 1961, 55 y.o.   MRN: [id number]  CSW attempted to compete assessment. Pt was unable to participate. CSW will attempt at another time.   [place] MSW, [place]  04/01/2015 3:26 PM

## 2015-04-01 NOTE — Progress Notes (Addendum)
[facility] MD Progress Note  04/01/2015 4:26 PM Sharon Woods  MRN:  [id number]  Subjective:  Sharon Woods is utterly confused but no longer catatoni she is unable to answer simple questions and is unsure of everything. She does not remember whether she slept or ate. She does not remember her medications. Apparently she does not remember that she lost her sister lately. There are no somatic complaints.   Principal Problem: Severe major depression with psychotic features, mood-congruent ([place]) Diagnosis:   Patient Active Problem List   Diagnosis Date Noted  . Major depressive single episode severe with psychosis ([place]) [F32.3] 03/29/2015  . Severe major depression, single episode, with psychotic features, mood-congruent ([place]) [F32.3] 03/29/2015  . Severe major depression with psychotic features, mood-congruent ([place]) [F32.3]   . Protein-calorie malnutrition, severe [E43] 03/26/2015  . Depression, major, single episode, severe ([place]) [F32.2] 03/26/2015  . 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 Hypernatremia [E87.0] 03/25/2015  . UTI (lower urinary tract infection) [N39.0] 03/25/2015   Total Time spent with patient: 20 minutes  Past Psychiatric History:None.  Past Medical History:  Past Medical History  Diagnosis Date  . Patient denies medical problems     Past Surgical History  Procedure Laterality Date  . No past surgeries     Family History:  Family History  Problem Relation Age of Onset  . Cancer    . Dementia     Family Psychiatric  History: See H&P.  Social History:  History  Alcohol Use No     History  Drug Use No    Social History   Social History  . Marital Status: Married    Spouse Name: N/A  . Number of Children: N/A  . Years of Education: N/A   Social History Main Topics  . Smoking status: Never Smoker   . Smokeless tobacco: None  . Alcohol Use: No  . Drug Use: No  . Sexual Activity: Not Asked     Comment: unable to assess    Other Topics Concern  . None    Social History Narrative   Additional Social History:    Pain Medications:  (na) Prescriptions:  (na) Over the Counter:  (na) History of alcohol / drug use?: No history of alcohol / drug abuse Negative Consequences of Use: Personal relationships Withdrawal Symptoms: Other (Comment) (na)                    Sleep: Fair  Appetite:  Fair  Current Medications: Current Facility-Administered Medications  Medication Dose Route Frequency Provider Last Rate Last Dose  . acetaminophen (TYLENOL) tablet 650 mg  650 mg Oral Q6H PRN Gonzella Lex, MD      . alum & mag hydroxide-simeth (MAALOX/MYLANTA) 200-200-20 MG/5ML suspension 30 mL  30 mL Oral Q4H PRN Gonzella Lex, MD      . feeding supplement (ENSURE ENLIVE) (ENSURE ENLIVE) liquid 237 mL  237 mL Oral TID BM Sharon Waldrop B Keah Lamba, MD   237 mL at 04/01/15 1336  . magnesium hydroxide (MILK OF MAGNESIA) suspension 30 mL  30 mL Oral Daily PRN Gonzella Lex, MD      . mirtazapine (REMERON SOL-TAB) disintegrating tablet 30 mg  30 mg Oral QHS Gonzella Lex, MD   30 mg at 03/31/15 2231  . OLANZapine zydis (ZYPREXA) disintegrating tablet 10 mg  10 mg Oral QHS Gonzella Lex, MD   10 mg at 03/31/15 2231  . temazepam (RESTORIL) capsule 15 mg  15 mg Oral  QHS Clovis Fredrickson, MD        Lab Results: No results found for this or any previous visit (from the past 48 hour(s)).  Physical Findings: AIMS: Facial and Oral Movements Muscles of Facial Expression: None, normal Lips and Perioral Area: None, normal Jaw: None, normal Tongue: None, normal,Extremity Movements Upper (arms, wrists, hands, fingers): None, normal Lower (legs, knees, ankles, toes): None, normal, Trunk Movements Neck, shoulders, hips: None, normal, Overall Severity Severity of abnormal movements (highest score from questions above): None, normal Incapacitation due to abnormal movements: None, normal Patient's awareness of abnormal movements (rate only patient's  report): Aware, no distress, Dental Status Current problems with teeth and/or dentures?: No Does patient usually wear dentures?: No  CIWA:  CIWA-Ar Total: 2 COWS:  COWS Total Score: 1  Musculoskeletal: Strength & Muscle Tone: within normal limits Gait & Station: normal Patient leans: N/A  Psychiatric Specialty Exam: Review of Systems  All other systems reviewed and are negative.   Blood pressure 145/78, pulse 69, temperature 98 F (36.7 C), temperature source Oral, resp. rate 18, height 5\' 4"  (1.626 m), weight 48.535 kg (107 lb), SpO2 98 %.Body mass index is 18.36 kg/(m^2).  General Appearance: Disheveled  Eye Contact::  Minimal  Speech:  Blocked and Slow  Volume:  Decreased  Mood:  Depressed  Affect:  Blunt  Thought Process:  Disorganized  Orientation:  Full (Time, Place, and Person)  Thought Content:  WDL  Suicidal Thoughts:  No  Homicidal Thoughts:  No  Memory:  Immediate;   Fair Recent;   Fair Remote;   Fair  Judgement:  Poor  Insight:  Lacking  Psychomotor Activity:  Decreased  Concentration:  Poor  Recall:  Poor  Fund of Knowledge:Poor  Language: Poor  Akathisia:  No  Handed:  Right  AIMS (if indicated):     Assets:  Communication Skills Desire for Improvement Financial Resources/Insurance Housing Physical Health Resilience Social Support  ADL's:  Intact  Cognition: WNL  Sleep:  Number of Hours: 1.3   Treatment Plan Summary: Daily contact with patient to assess and evaluate symptoms and progress in treatment and Medication management   Sharon Woods is a 55 year old female with no past psychiatric history admitted for catatonia and altered mental status  1. Catatonia. Improving. The patient able to walk and talk.  2.Mood/ Psychosis. She was started on Zyprexa and Remeron.  3. Insomnia. She slept 1 hour only. Will start temazepam.  4. Poor oral intake. We'll continue ensure.  5. UTI. She was given Rocephin on medical floor. We'll recheck the urine.    6. Disposition. She will be discharged to home with her family. She will follow up with her local provider.      [name] 04/01/2015, 4:26 PM

## 2015-04-02 LAB — AMMONIA: AMMONIA: 15 umol/L (ref 9–35)

## 2015-04-02 MED ORDER — IBUPROFEN 600 MG PO TABS: 600.0000 mg | ORAL_TABLET | Freq: Four times a day (QID) | ORAL | Status: DC | PRN

## 2015-04-02 MED ORDER — OLANZAPINE 5 MG PO TBDP: 15.0000 mg | ORAL_TABLET | Freq: Every day | ORAL | Status: DC

## 2015-04-02 MED ORDER — ADULT MULTIVITAMIN W/MINERALS CH
1.0000 | ORAL_TABLET | Freq: Every day | ORAL | Status: DC
  Administered 2015-04-03 – 2015-04-05 (×3): 1 via ORAL
  Filled 2015-04-02 (×4): qty 1

## 2015-04-02 MED ORDER — IBUPROFEN 600 MG PO TABS
600.0000 mg | ORAL_TABLET | Freq: Once | ORAL | Status: DC
  Filled 2015-04-02: qty 1

## 2015-04-02 MED ORDER — LORAZEPAM 1 MG PO TABS: 1.0000 mg | ORAL_TABLET | Freq: Every evening | ORAL | Status: DC | PRN

## 2015-04-02 NOTE — [facility] Group Notes (Signed)
[facility] Group Notes:  (Nursing/MHT/Case Management/Adjunct)  Date:  04/02/2015  Time:  12:18 PM  Type of Therapy:  Psychoeducational Skills  Participation Level:  Did Not Attend    Brizia Holtgrewe 04/02/2015, 12:18 PM

## 2015-04-02 NOTE — Progress Notes (Signed)
Patient very disorganized and confused. Patient paces around and just stares off into space. Patient refuses medications stating she does not like how they make her feel. She states that they make her feel "weird". The patient states that she has has had a good day but endorses depression. The patient mixes ideas and jumps from one subject to the next. No unsafe behaviors noted. Q 15 maintained for safety.

## 2015-04-02 NOTE — Progress Notes (Signed)
D:  Pt avoids interaction, flat/pensive, paranoid, suspicious about medications, pt refused to take her afternoon medications, denies SI/HI/AVH, thought blocking/response lag.      A:  Emotional support provided, Encouraged pt to continue with treatment plan and attend all group activities, q15 min checks maintained for safety.  R:  Pt is not going to groups, isolates in room, needs redirection at times, stands in hallway stares at staff, when staff asks questions pt has poor eye contact and very minimal with answers.

## 2015-04-02 NOTE — [facility] Group Notes (Signed)
[facility] LCSW Aftercare Discharge Planning Group Note  04/02/2015 9:15 AM  Participation Quality: Did Not Attend. Patient invited to participate but declined.   Alphonse Guild. Teisha Trowbridge, MSW, LCSWA, LCAS

## 2015-04-02 NOTE — Progress Notes (Signed)
Patient ID: Sharon Woods, female   DOB: 21-Jul-1960, 55 y.o.   MRN: [id number] CSw attempted to complete PSA with the pt.  Pt did not allow the CSW to complete the PSA and refused to speak with the CSW.

## 2015-04-02 NOTE — Progress Notes (Signed)
[facility] MD Progress Note  04/02/2015 3:04 PM Sharon Woods  MRN:  [id number]  Subjective:  Patient admitted due to major depressive disorder severe with psychotic features and catatonia. The patient is now eating better. She makes some efforts to answer questions however her answers are short and vague. She did not answer any questions when I ask her about psychiatric issues or symptoms.  She did complain of having lower back pain. Patient did the assessment as she has made to leave because she needed to use the restroom.   Per nursing Patient very disorganized and confused. Patient paces around and just stares off into space. Patient refuses medications stating she does not like how they make her feel. She states that they make her feel "weird". The patient states that she has has had a good day but endorses depression. The patient mixes ideas and jumps from one subject to the next. No unsafe behaviors noted. Q 15 maintained for safety.          Principal Problem: Severe major depression, single episode, with psychotic features, mood-congruent ([place]) Diagnosis:   Patient Active Problem List   Diagnosis Date Noted  . Severe major depression, single episode, with psychotic features, mood-congruent ([place]) [F32.3] 03/29/2015  . Protein-calorie malnutrition, severe [E43] 03/26/2015  . 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  Past Medical History:  Past Medical History  Diagnosis Date  . Patient denies medical problems     Past Surgical History  Procedure Laterality Date  . No past surgeries     Family History:  Family History  Problem Relation Age of Onset  . Cancer    . Dementia     Family Psychiatric  History: See H&P.  Social History:  History  Alcohol Use No     History  Drug Use No    Social History   Social History  . Marital Status: Married    Spouse Name: N/A  . Number of Children: N/A  .  Years of Education: N/A   Social History Main Topics  . Smoking status: Never Smoker   . Smokeless tobacco: None  . Alcohol Use: No  . Drug Use: No  . Sexual Activity: Not Asked     Comment: unable to assess    Other Topics Concern  . None   Social History Narrative   Additional Social History:    Pain Medications:  (na) Prescriptions:  (na) Over the Counter:  (na) History of alcohol / drug use?: No history of alcohol / drug abuse Negative Consequences of Use: Personal relationships Withdrawal Symptoms: Other (Comment) (na)                    Sleep: Fair  Appetite:  Fair  Current Medications: Current Facility-Administered Medications  Medication Dose Route Frequency Provider Last Rate Last Dose  . acetaminophen (TYLENOL) tablet 650 mg  650 mg Oral Q6H PRN Gonzella Lex, MD      . alum & mag hydroxide-simeth (MAALOX/MYLANTA) 200-200-20 MG/5ML suspension 30 mL  30 mL Oral Q4H PRN Gonzella Lex, MD      . feeding supplement (ENSURE ENLIVE) (ENSURE ENLIVE) liquid 237 mL  237 mL Oral TID BM Jolanta B Pucilowska, MD   237 mL at 04/02/15 1416  . ibuprofen (ADVIL,MOTRIN) tablet 600 mg  600 mg Oral Once Hildred Priest, MD      . ibuprofen (ADVIL,MOTRIN) tablet 600 mg  600  mg Oral Q6H PRN Hildred Priest, MD      . LORazepam (ATIVAN) tablet 1 mg  1 mg Oral QHS,MR X 1 Hildred Priest, MD      . magnesium hydroxide (MILK OF MAGNESIA) suspension 30 mL  30 mL Oral Daily PRN Gonzella Lex, MD      . mirtazapine (REMERON SOL-TAB) disintegrating tablet 30 mg  30 mg Oral QHS Gonzella Lex, MD   30 mg at 03/31/15 2231  . multivitamin with minerals tablet 1 tablet  1 tablet Oral Daily Hildred Priest, MD      . OLANZapine zydis (ZYPREXA) disintegrating tablet 15 mg  15 mg Oral QHS Hildred Priest, MD        Lab Results: No results found for this or any previous visit (from the past 48 hour(s)).  Physical Findings: AIMS: Facial  and Oral Movements Muscles of Facial Expression: None, normal Lips and Perioral Area: None, normal Jaw: None, normal Tongue: None, normal,Extremity Movements Upper (arms, wrists, hands, fingers): None, normal Lower (legs, knees, ankles, toes): None, normal, Trunk Movements Neck, shoulders, hips: None, normal, Overall Severity Severity of abnormal movements (highest score from questions above): None, normal Incapacitation due to abnormal movements: None, normal Patient's awareness of abnormal movements (rate only patient's report): Aware, no distress, Dental Status Current problems with teeth and/or dentures?: No Does patient usually wear dentures?: No  CIWA:  CIWA-Ar Total: 2 COWS:  COWS Total Score: 1  Musculoskeletal: Strength & Muscle Tone: within normal limits Gait & Station: normal Patient leans: N/A  Psychiatric Specialty Exam: Review of Systems  Constitutional: Negative.   HENT: Negative.   Eyes: Negative.   Respiratory: Negative.   Cardiovascular: Negative.   Musculoskeletal: Positive for back pain.  Skin: Negative.   Neurological: Negative.   Endo/Heme/Allergies: Negative.   Psychiatric/Behavioral:       Did not answer    Blood pressure 125/81, pulse 67, temperature 98.8 F (37.1 C), temperature source Oral, resp. rate 18, height 5\' 4"  (1.626 m), weight 48.535 kg (107 lb), SpO2 98 %.Body mass index is 18.36 kg/(m^2).  General Appearance: Disheveled  Eye Contact::  Minimal  Speech:  Blocked and Slow  Volume:  Decreased  Mood:  Depressed  Affect:  Blunt  Thought Process:  Disorganized  Orientation:  Full (Time, Place, and Person)  Thought Content:  WDL  Suicidal Thoughts:  No  Homicidal Thoughts:  No  Memory:  Immediate;   Fair Recent;   Fair Remote;   Fair  Judgement:  Poor  Insight:  Lacking  Psychomotor Activity:  Decreased  Concentration:  Poor  Recall:  Poor  Fund of Knowledge:Poor  Language: Poor  Akathisia:  No  Handed:  Right  AIMS (if  indicated):     Assets:  Communication Skills Desire for Improvement Financial Resources/Insurance Housing Physical Health Resilience Social Support  ADL's:  Intact  Cognition: WNL  Sleep:  Number of Hours: 5.25   Treatment Plan Summary: Daily contact with patient to assess and evaluate symptoms and progress in treatment and Medication management   Ms. Yong Channel is a 55 year old female with no past psychiatric history admitted for catatonia and altered mental status  Catatonia. Improving. The patient able to walk and talk.  He answers some questions with monosyllabic answers.  Mood/ Psychosis: I will increase zyprexa to 15 mg po q day. Continue remeron 30 mg q day  Insomnia: I will order ativan 1 mg qhs  Slept 5 h last night  Poor oral intake: much  improved.  Continue ensure tid.  Will start vitamins q day.  UTI. She was given Rocephin on medical floor. Will recheck UA today  Back pain: will order ibuprofen prn  Disposition. She will be discharged to home with her family. She will follow up with her local provider.    Labs: I will order an ammonia, RPR, HIV, vitamin B12.   Hildred Priest 04/02/2015, 3:04 PM

## 2015-04-02 NOTE — [facility] Group Notes (Signed)
[place] LCSW Group Therapy   04/02/2015 1 pm  Type of Therapy: Group Therapy   Participation Level: Did Not Attend. Patient invited to participate but declined.    Alphonse Guild. [place], MSW, LCSWA, LCAS

## 2015-04-03 LAB — VITAMIN B12: VITAMIN B 12: 543 pg/mL (ref 180–914)

## 2015-04-03 MED ORDER — LORAZEPAM 0.5 MG PO TABS
0.5000 mg | ORAL_TABLET | Freq: Every day | ORAL | Status: DC
  Administered 2015-04-03 – 2015-04-15 (×13): 0.5 mg via ORAL
  Filled 2015-04-03 (×13): qty 1

## 2015-04-03 MED ORDER — OLANZAPINE 10 MG IM SOLR
10.0000 mg | Freq: Two times a day (BID) | INTRAMUSCULAR | Status: DC
  Administered 2015-04-03: 10 mg via INTRAMUSCULAR
  Filled 2015-04-03 (×2): qty 20

## 2015-04-03 MED ORDER — OLANZAPINE 5 MG PO TBDP
10.0000 mg | ORAL_TABLET | Freq: Two times a day (BID) | ORAL | Status: DC
  Administered 2015-04-03 – 2015-04-09 (×12): 10 mg via ORAL
  Filled 2015-04-03 (×12): qty 2
  Filled 2015-04-03: qty 1
  Filled 2015-04-03: qty 2

## 2015-04-03 MED ORDER — MAGNESIUM CITRATE PO SOLN
0.5000 | Freq: Once | ORAL | Status: AC
  Administered 2015-04-03: 0.5 via ORAL
  Filled 2015-04-03 (×2): qty 296

## 2015-04-03 NOTE — [facility] Group Notes (Signed)
[place] Group Notes:  (Nursing/MHT/Case Management/Adjunct)  Date:  04/03/2015  Time:  2:22 PM  Type of Therapy:  Psychoeducational Skills  Participation Level:  Minimal  Participation Quality:  Resistant  Affect:  Resistant  Cognitive:  Confused and Lacking  Insight:  Lacking and Limited  Engagement in Group:  Limited, Poor and Resistant  Modes of Intervention:  Discussion, Education and Support  Summary of Progress/Problems:  Adela Lank Rheanna Sergent 04/03/2015, 2:22 PM

## 2015-04-03 NOTE — Progress Notes (Signed)
Nutrition Follow-up  DOCUMENTATION CODES:   Severe malnutrition in context of social or environmental circumstances  INTERVENTION:  Meals and snacks: Cater to pt preferences Medical Nutrition Supplement Therapy: continue ensure enlive for added nutrition.   NUTRITION DIAGNOSIS:   Malnutrition related to social / environmental circumstances as evidenced by severe depletion of muscle mass, severe depletion of body fat, energy intake < 75% for > or equal to 3 months, percent weight loss.    GOAL:   Patient will meet greater than or equal to 90% of their needs    MONITOR:    (Energy Intake, Anthropometrics, Digestive System, Electrolyte and Renal Profile)  REASON FOR ASSESSMENT:   Malnutrition Screening Tool    ASSESSMENT:    Current Nutrition: drinking ensure per nursing note.  Average po intake 54% of meals   Gastrointestinal Profile: Last BM: WDL   Scheduled Medications:  . feeding supplement (ENSURE ENLIVE)  237 mL Oral TID BM  . ibuprofen  600 mg Oral Once  . LORazepam  0.5 mg Oral QHS  . mirtazapine  30 mg Oral QHS  . multivitamin with minerals  1 tablet Oral Daily  . OLANZapine  10 mg Intramuscular BID  . OLANZapine zydis  10 mg Oral BID       Electrolyte/Renal Profile and Glucose Profile:   Recent Labs Lab 03/28/15 0621 03/30/15 0810  NA 142 138  K 3.8 3.5  CL 107 103  CO2 27 31  BUN 16 19  CREATININE 0.54 0.53  CALCIUM 9.1 9.5  GLUCOSE 173* 138*      Weight Trend since Admission: Filed Weights   03/29/15 1625  Weight: 107 lb (48.535 kg)    Diet Order:  Diet regular Room service appropriate?: Yes; Fluid consistency:: Thin  Skin:   reviewed   Height:   Ht Readings from Last 1 Encounters:  03/29/15 5\' 4"  (1.626 m)    Weight:   Wt Readings from Last 1 Encounters:  03/29/15 107 lb (48.535 kg)    Ideal Body Weight:     BMI:  Body mass index is 18.36 kg/(m^2).  Estimated Nutritional Needs:   Kcal:  BEE: 1077kcals, TEE:  (IF 1.1-1.3)(AF 1.3) 1540-1820kcals  Protein:  48-58g protein (1.0-1.2g/kg)  Fluid:  1461-1721mL of fluid (25-12mL/kg)  EDUCATION NEEDS:   No education needs identified at this time  LOW Care Level  Taleyah Hillman B. Zenia Resides, [place], [place] (pager) Weekend/On-Call pager [phone])

## 2015-04-03 NOTE — [facility] Group Notes (Deleted)
[place] LCSW Group Therapy   04/03/2015 11:00 am  Type of Therapy: Group Therapy   Participation Level: Active   Participation Quality: Attentive, Sharing and Supportive   Affect: Appropriate  Cognitive: Alert and Oriented   Insight: Developing/Improving and Engaged   Engagement in Therapy: Developing/Improving and Engaged   Modes of Intervention: Clarification, Confrontation, Discussion, Education, Exploration,  Limit-setting, Orientation, Problem-solving, Rapport Building, Art therapist, Socialization and Support  Summary of Progress/Problems: The topic for group therapy was feelings about diagnosis. Pt actively participated in group discussion on their past and current diagnosis and how they feel towards this. Pt also identified how society and family members judge them, based on their diagnosis as well as stereotypes and stigmas. Pt was polite and cooperative with the CSW and other group members and focused and attentive to the topics discussed and the sharing of others. Pt shared she was being discharged and that she felt that if she continues to take medication and seek outpatient therapy she will make a full recovery, despite the stigma of mental illness.  Pt shared she now feels positive about her mental health diagnosis if it leads to a successful recovery.    Alphonse Guild. Yuli Lanigan, LCSWA, LCAS

## 2015-04-03 NOTE — Progress Notes (Signed)
Meds available and staff available and writer able to administer meds at this time. Patient drinks all of Mag Citrate with encouragement and support from Probation officer.

## 2015-04-03 NOTE — Progress Notes (Signed)
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 The patient is now eating better. She makes some efforts to answer questions however her answers are short and vague. She did not answer any questions when I ask her about psychiatric issues or symptoms.  She did complain of constipation and abdominal pain. Patient patient again terminated the assessment early stating that she needed to use the restroom. The patient did the same thing yesterday.  Thought blocking still present, and patient appears overwhelmed, distressed. Staff describes her as suspicious and paranoid.  Social worker will attempt to contact her family. Staff tell us that she was visited by her son yesterday.   Per nursing Patient does not interact with peers. She is isolative and suspicious. Patient refused her medications but drank her ensure after much prompting. Patient is vague when assessed. Shrugs shoulders when asked questions. Q 15 min checks maintained for safety. Will continue to monitor.   Principal Problem: Severe major depression, single episode, with psychotic features, mood-congruent ([place]) Diagnosis:   Patient Active Problem List   Diagnosis Date Noted  . Severe major depression, single episode, with psychotic features, mood-congruent ([place]) [F32.3] 03/29/2015  . Protein-calorie malnutrition, severe [E43] 03/26/2015  . 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  Past Medical History:  Past Medical History  Diagnosis Date  . Patient denies medical problems     Past Surgical History  Procedure Laterality Date  . No past surgeries     Family History:  Family History  Problem Relation Age of Onset  . Cancer    . Dementia     Family Psychiatric  History: See H&P.  Social History:  History  Alcohol Use  No     History  Drug Use No    Social History   Social History  . Marital Status: Married    Spouse Name: N/A  . Number of Children: N/A  . Years of Education: N/A   Social History Main Topics  . Smoking status: Never Smoker   . Smokeless tobacco: None  . Alcohol Use: No  . Drug Use: No  . Sexual Activity: Not Asked     Comment: unable to assess    Other Topics Concern  . None   Social History Narrative   Additional Social History:    Pain Medications:  (na) Prescriptions:  (na) Over the Counter:  (na) History of alcohol / drug use?: No history of alcohol / drug abuse Negative Consequences of Use: Personal relationships Withdrawal Symptoms: Other (Comment) (na)     Sleep: Poor  Appetite:  Fair  Current Medications: Current Facility-Administered Medications  Medication Dose Route Frequency Provider Last Rate Last Dose  . acetaminophen (TYLENOL) tablet 650 mg  650 mg Oral Q6H PRN Gonzella Lex, MD      . alum & mag hydroxide-simeth (MAALOX/MYLANTA) 200-200-20 MG/5ML suspension 30 mL  30 mL Oral Q4H PRN Gonzella Lex, MD      . feeding supplement (ENSURE ENLIVE) (ENSURE ENLIVE) liquid 237 mL  237 mL Oral TID BM Jolanta B Pucilowska, MD   237 mL at 04/03/15 1400  . ibuprofen (ADVIL,MOTRIN) tablet 600 mg  600 mg Oral Once Hildred Priest, MD   600 mg at 04/02/15 1529  . ibuprofen (ADVIL,MOTRIN) tablet 600 mg  600 mg Oral Q6H PRN  Hildred Priest, MD      . LORazepam (ATIVAN) tablet 0.5 mg  0.5 mg Oral QHS Hildred Priest, MD      . magnesium hydroxide (MILK OF MAGNESIA) suspension 30 mL  30 mL Oral Daily PRN Gonzella Lex, MD      . mirtazapine (REMERON SOL-TAB) disintegrating tablet 30 mg  30 mg Oral QHS Gonzella Lex, MD   30 mg at 03/31/15 2231  . multivitamin with minerals tablet 1 tablet  1 tablet Oral Daily Hildred Priest, MD   1 tablet at 04/03/15 1014  . OLANZapine (ZYPREXA) injection 10 mg  10 mg Intramuscular BID  Hildred Priest, MD   10 mg at 04/03/15 1520  . OLANZapine zydis (ZYPREXA) disintegrating tablet 10 mg  10 mg Oral BID Hildred Priest, MD        Lab Results:  Results for orders placed or performed during the hospital encounter of 03/29/15 (from the past 48 hour(s))  Ammonia     Status: None   Collection Time: 04/02/15  3:15 PM  Result Value Ref Range   Ammonia 15 9 - 35 umol/L    Physical Findings: AIMS: Facial and Oral Movements Muscles of Facial Expression: None, normal Lips and Perioral Area: None, normal Jaw: None, normal Tongue: None, normal,Extremity Movements Upper (arms, wrists, hands, fingers): None, normal Lower (legs, knees, ankles, toes): None, normal, Trunk Movements Neck, shoulders, hips: None, normal, Overall Severity Severity of abnormal movements (highest score from questions above): None, normal Incapacitation due to abnormal movements: None, normal Patient's awareness of abnormal movements (rate only patient's report): Aware, no distress, Dental Status Current problems with teeth and/or dentures?: No Does patient usually wear dentures?: No  CIWA:  CIWA-Ar Total: 2 COWS:  COWS Total Score: 1  Musculoskeletal: Strength & Muscle Tone: within normal limits Gait & Station: normal Patient leans: N/A  Psychiatric Specialty Exam: Review of Systems  Constitutional: Negative.   HENT: Negative.   Eyes: Negative.   Respiratory: Negative.   Cardiovascular: Negative.   Gastrointestinal: Positive for constipation.  Musculoskeletal: Negative for myalgias, back pain, joint pain, falls and neck pain.  Skin: Negative.   Neurological: Negative.   Endo/Heme/Allergies: Negative.   Psychiatric/Behavioral:       Did not answer    Blood pressure 147/70, pulse 66, temperature 98.1 F (36.7 C), temperature source Oral, resp. rate 18, height 5\' 4"  (1.626 m), weight 48.535 kg (107 lb), SpO2 98 %.Body mass index is 18.36 kg/(m^2).  General Appearance:  Disheveled  Eye Contact::  Minimal  Speech:  Blocked and Slow  Volume:  Decreased  Mood:  Depressed  Affect:  Blunt  Thought Process:  Disorganized  Orientation:  Full (Time, Place, and Person)  Thought Content:  WDL  Suicidal Thoughts:  No  Homicidal Thoughts:  No  Memory:  Immediate;   Fair Recent;   Fair Remote;   Fair  Judgement:  Poor  Insight:  Lacking  Psychomotor Activity:  Decreased  Concentration:  Poor  Recall:  Poor  Fund of Knowledge:Poor  Language: Poor  Akathisia:  No  Handed:  Right  AIMS (if indicated):     Assets:  Communication Skills Desire for Improvement Financial Resources/Insurance Housing Physical Health Resilience Social Support  ADL's:  Intact  Cognition: WNL  Sleep:  Number of Hours: 3.25   Treatment Plan Summary: Daily contact with patient to assess and evaluate symptoms and progress in treatment and Medication management   Sharon Woods is a 55 year old female with no past  psychiatric history admitted for catatonia and altered mental status  Catatonia. Improving. The patient able to walk and talk.  He answers some questions with monosyllabic answers. Continues to have thought blocking.  Mood/ Psychosis:appears distressed, very anxious and fearful, overwhelmed. Has been refusing medications since December 31. She will be started unknown emergency forced medications today. Patient will receive olanzapine IM 10 mg by mouth twice a day if she refuses olanzapine 10 mg by mouth by mouth twice a day.  Insomnia: continue Ativan at bedtime, I will decrease the dose from 1 mg daily at bedtime to 0.5 mg daily at bedtime as today she will receive a higher dose of olanzapine. Slept 3 h last night  Poor oral intake: much improved.  Continue ensure tid.  Continue vitamins q day. Patient had 90% of lunch just today, 100% of her dinner and 80% of breakfast.  UTI. She was given Rocephin on medical floor. We attempted to check UA yesterday but patient is  uncooperative.  Constipation: patient reports abdominal pain and inability to move her bowels. I will order magnesium citrate today  Back pain: continue ibuprofen prn  Disposition. She will be discharged to home with her family. She will follow up with her local provider.   Labs:RPR, HIV, vitamin B12 pending. Ammonia was within the normal limits.   Hildred Priest 04/03/2015, 4:31 PM

## 2015-04-03 NOTE — Progress Notes (Signed)
Patient does not interact with peers. She is isolative and suspicious. Patient refused her medications but drank her ensure after much prompting. Patient is vague when assessed. Shrugs shoulders when asked questions. Q 15 min checks maintained for safety. Will continue to monitor.

## 2015-04-04 LAB — HIV ANTIBODY (ROUTINE TESTING W REFLEX): HIV Screen 4th Generation wRfx: NONREACTIVE

## 2015-04-04 LAB — RPR: RPR: NONREACTIVE

## 2015-04-04 NOTE — Progress Notes (Signed)
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 Started on non emergency forced medications on 04/03/15. The patient is now eating better. She slept better last night after taking higher dose of zyprexa and ativan qhs.   She makes some efforts to answer questions however her answers are short and vague.   Thought blocking still present, and patient appears overwhelmed, distressed. Staff describes her as suspicious and paranoid.  Social worker will attempt to contact her family. Staff tell us that she was visited by her son 2 days ago.  Oral intake: 50-80% of all her meals yesterday. Sleep: 6.4 h Participation in group: none Compliance with medications: 100% compliance with oral meds yesterday after she was started on non emergency forced medication VS: stable  Labs: HIV-RPR-Ammonia-B12-TSH ---non reactive/wnl  Per nursing A&Ox2, re-oriented to PPT, assigned room closer to the nurses' station for close observation for safety and falls, denied SI/HI, irritable in mood, blunt affect, easily frustrated; hesitant to take PO medications but did after much persuasion; patient is encouraged to express feelings. Clinical and moral support provided.  Principal Problem: Severe major depression, single episode, with psychotic features, mood-congruent ([place]) Diagnosis:   Patient Active Problem List   Diagnosis Date Noted  . Severe major depression, single episode, with psychotic features, mood-congruent ([place]) [F32.3] 03/29/2015  . Protein-calorie malnutrition, severe [E43] 03/26/2015  . 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  Past Medical History:  Past Medical History  Diagnosis Date  . Patient denies medical problems     Past Surgical History  Procedure Laterality Date  . No past  surgeries     Family History:  Family History  Problem Relation Age of Onset  . Cancer    . Dementia     Family Psychiatric  History: See H&P.  Social History:  History  Alcohol Use No     History  Drug Use No    Social History   Social History  . Marital Status: Married    Spouse Name: N/A  . Number of Children: N/A  . Years of Education: N/A   Social History Main Topics  . Smoking status: Never Smoker   . Smokeless tobacco: None  . Alcohol Use: No  . Drug Use: No  . Sexual Activity: Not Asked     Comment: unable to assess    Other Topics Concern  . None   Social History Narrative   Additional Social History:    Pain Medications:  (na) Prescriptions:  (na) Over the Counter:  (na) History of alcohol / drug use?: No history of alcohol / drug abuse Negative Consequences of Use: Personal relationships Withdrawal Symptoms: Other (Comment) (na)     Sleep: Good  Appetite:  Fair  Current Medications: Current Facility-Administered Medications  Medication Dose Route Frequency Provider Last Rate Last Dose  . acetaminophen (TYLENOL) tablet 650 mg  650 mg Oral Q6H PRN Gonzella Lex, MD      . alum & mag hydroxide-simeth (MAALOX/MYLANTA) 200-200-20 MG/5ML suspension 30 mL  30 mL Oral Q4H PRN Gonzella Lex, MD      . feeding supplement (ENSURE ENLIVE) (ENSURE ENLIVE) liquid 237 mL  237 mL Oral TID BM Jolanta B Pucilowska, MD   237 mL at 04/04/15 1000  . ibuprofen (ADVIL,MOTRIN) tablet 600 mg  600 mg  Oral Once Hildred Priest, MD   600 mg at 04/02/15 1529  . ibuprofen (ADVIL,MOTRIN) tablet 600 mg  600 mg Oral Q6H PRN Hildred Priest, MD      . LORazepam (ATIVAN) tablet 0.5 mg  0.5 mg Oral QHS Hildred Priest, MD   0.5 mg at 04/03/15 2114  . magnesium hydroxide (MILK OF MAGNESIA) suspension 30 mL  30 mL Oral Daily PRN Gonzella Lex, MD      . mirtazapine (REMERON SOL-TAB) disintegrating tablet 30 mg  30 mg Oral QHS Gonzella Lex, MD   30  mg at 04/03/15 2114  . multivitamin with minerals tablet 1 tablet  1 tablet Oral Daily Hildred Priest, MD   1 tablet at 04/04/15 0934  . OLANZapine (ZYPREXA) injection 10 mg  10 mg Intramuscular BID Hildred Priest, MD   10 mg at 04/03/15 1520  . OLANZapine zydis (ZYPREXA) disintegrating tablet 10 mg  10 mg Oral BID Hildred Priest, MD   10 mg at 04/04/15 [id number]    Lab Results:  Results for orders placed or performed during the hospital encounter of 03/29/15 (from the past 48 hour(s))  RPR     Status: None   Collection Time: 04/02/15  3:15 PM  Result Value Ref Range   RPR Ser Ql Non Reactive Non Reactive    Comment: (NOTE) Performed At: [facility] [address] [place], [place] [id number] Lindon Romp MD [id number]   HIV antibody     Status: None   Collection Time: 04/02/15  3:15 PM  Result Value Ref Range   HIV Screen 4th Generation wRfx Non Reactive Non Reactive    Comment: (NOTE) Performed At: [facility] [place], [place] [id number] Lindon Romp MD [id number]   Vitamin B12     Status: None   Collection Time: 04/02/15  3:15 PM  Result Value Ref Range   Vitamin B-12 543 180 - 914 pg/mL    Comment: (NOTE) This assay is not validated for testing neonatal or myeloproliferative syndrome specimens for Vitamin B12 levels. Performed at [facility]   Ammonia     Status: None   Collection Time: 04/02/15  3:15 PM  Result Value Ref Range   Ammonia 15 9 - 35 umol/L    Physical Findings: AIMS: Facial and Oral Movements Muscles of Facial Expression: None, normal Lips and Perioral Area: None, normal Jaw: None, normal Tongue: None, normal,Extremity Movements Upper (arms, wrists, hands, fingers): None, normal Lower (legs, knees, ankles, toes): None, normal, Trunk Movements Neck, shoulders, hips: None, normal, Overall Severity Severity of abnormal movements (highest score from questions above):  None, normal Incapacitation due to abnormal movements: None, normal Patient's awareness of abnormal movements (rate only patient's report): Aware, no distress, Dental Status Current problems with teeth and/or dentures?: No Does patient usually wear dentures?: No  CIWA:  CIWA-Ar Total: 2 COWS:  COWS Total Score: 1  Musculoskeletal: Strength & Muscle Tone: within normal limits Gait & Station: normal Patient leans: N/A  Psychiatric Specialty Exam: Review of Systems  Constitutional: Negative.   HENT: Negative.   Eyes: Negative.   Respiratory: Negative.   Cardiovascular: Negative.   Gastrointestinal: Positive for constipation.  Musculoskeletal: Negative for myalgias, back pain, joint pain, falls and neck pain.  Skin: Negative.   Neurological: Negative.   Endo/Heme/Allergies: Negative.   Psychiatric/Behavioral:       Did not answer    Blood pressure 90/61, pulse 76, temperature 98.6 F (37 C), temperature source Oral,  resp. rate 18, height 5\' 4"  (1.626 m), weight 48.535 kg (107 lb), SpO2 98 %.Body mass index is 18.36 kg/(m^2).  General Appearance: Disheveled  Eye Contact::  Minimal  Speech:  Blocked and Slow  Volume:  Decreased  Mood:  Depressed  Affect:  Blunt  Thought Process:  Disorganized  Orientation:  Full (Time, Place, and Person)  Thought Content:  WDL  Suicidal Thoughts:  No  Homicidal Thoughts:  No  Memory:  Immediate;   Fair Recent;   Fair Remote;   Fair  Judgement:  Poor  Insight:  Lacking  Psychomotor Activity:  Decreased  Concentration:  Poor  Recall:  Poor  Fund of Knowledge:Poor  Language: Poor  Akathisia:  No  Handed:  Right  AIMS (if indicated):     Assets:  Communication Skills Desire for Improvement Financial Resources/Insurance Housing Physical Health Resilience Social Support  ADL's:  Intact  Cognition: WNL  Sleep:  Number of Hours: 6.45   Treatment Plan Summary: Daily contact with patient to assess and evaluate symptoms and progress  in treatment and Medication management   Ms. Yong Channel is a 55 year old female with no past psychiatric history admitted for catatonia and altered mental status  Catatonia. Improving. The patient able to walk and talk.  He answers some questions with monosyllabic answers. Continues to have thought blocking.  Mood/ Psychosis: appears distressed, very anxious and fearful, overwhelmed. Has been refusing medications since December 31. She was started on non emergency forced medications on 04/03/15. Patient will receive olanzapine IM 10 mg by mouth twice a day if she refuses olanzapine 10 mg (zydis) by mouth by mouth twice a day.  Also continue remeron 30 mg qhs (desintegrating tablet)  Insomnia: continue Ativan  0.5 mg daily at bedtime. Slept 6.4 h last night  Poor oral intake: much improved.  Continue ensure tid.  Continue vitamins q day. Patient good oral intake yesterday, 50-80% of all her meals. This morning had 100% of breakfast.  UTI. She was given Rocephin on medical floor. We attempted to check UA yesterday but patient is uncooperative.  Constipation: patient received magnesium citrate on 04/03/15  Back pain: continue ibuprofen prn  Disposition. She will be discharged to home with her family. She will follow up with her local provider.   Labs:RPR, HIV--non reactive vitamin B12 wnl. Ammonia was within the normal limits.   Hildred Priest 04/04/2015, 9:54 AM

## 2015-04-04 NOTE — Plan of Care (Signed)
Problem: Ineffective individual coping Goal: STG: Patient will remain free from self harm Outcome: Progressing Pt remains paranoid and suspicious. Med compliant. q 15 min checks maintained for safety. Clinical support provided. Pt encouraged to express feelings and concerns . Pt remain free from harm. Will continue to monitor for safety and behavior.

## 2015-04-04 NOTE — Progress Notes (Signed)
Pt isolates to self and room. Tearful and confused at times. Med compliant. Does not interact with peers. Minimal interaction with staff. Encouraged pt to come out of room and attend group. Pt refused. Remains safe on unit with q 15 min checks.

## 2015-04-04 NOTE — Progress Notes (Signed)
Recreation Therapy Notes  Date: 01.04.17 Time: 3:00 pm Location: Craft Room  Group Topic: Self-esteem  Goal Area(s) Addresses:  Patient will write at least one positive trait. Patient will verbalize benefit of having a healthy self-esteem.  Behavioral Response: Did not attend  Intervention: I Am  Activity: Patients were given a worksheet with the letter I on it and instructed to write as many positive traits inside the letter as they could.  Education: LRT educated patients on ways they can increase their self-esteem.  Education Outcome: Patient did not attend group.  Clinical Observations/Feedback: Patient did not attend group.  Leonette Monarch, LRT/CTRS 04/04/2015 4:11 PM

## 2015-04-04 NOTE — Progress Notes (Signed)
A&Ox2, re-oriented to PPT, assigned room closer to the nurses' station for close observation for safety and falls, denied SI/HI, irritable in mood, blunt affect, easily frustrated; hesitant to take PO medications but did after much persuasion; patient is encouraged to express feelings. Clinical and moral support provided.

## 2015-04-04 NOTE — [facility] Group Notes (Signed)
[place] Group Notes:  (Nursing/MHT/Case Management/Adjunct)  Date:  04/04/2015  Time:  6:13 PM  Type of Therapy:  Psychoeducational Skills  Participation Level:  Did Not Attend   Jessee Avers 04/04/2015, 6:13 PM

## 2015-04-04 NOTE — BHH Group Notes (Signed)
[place] LCSW Group Therapy   04/04/2015 1pm  Type of Therapy: Group Therapy   Participation Level: Did Not Attend. Patient invited to participate but declined.    Sharon Woods. Sharon Woods, MSW, LCSWA, LCAS

## 2015-04-04 NOTE — BHH Group Notes (Signed)
[facility] LCSW Aftercare Discharge Planning Group Note  04/04/2015 9:15 AM  Participation Quality: Did Not Attend. Patient invited to participate but declined.   Sharon Woods. Sharon Woods, MSW, LCSWA, LCAS

## 2015-04-04 NOTE — Plan of Care (Signed)
Problem: Ineffective individual coping Goal: STG: Patient will remain free from self harm Outcome: Progressing Medications administered as ordered by the physician, medications Therapeutic Effects, SEs and Adverse effects discussed, questions encouraged; no PRN given, 15 minute checks maintained for safety, clinical and moral support provided, patient encouraged to continue to express feelings and demonstrate safe care. Patient remain free from harm, will continue to monitor.         

## 2015-04-05 LAB — URINALYSIS COMPLETE WITH MICROSCOPIC (ARMC ONLY)
BACTERIA UA: NONE SEEN
BILIRUBIN URINE: NEGATIVE
GLUCOSE, UA: NEGATIVE mg/dL
HGB URINE DIPSTICK: NEGATIVE
KETONES UR: NEGATIVE mg/dL
LEUKOCYTES UA: NEGATIVE
NITRITE: NEGATIVE
PH: 7 (ref 5.0–8.0)
Protein, ur: NEGATIVE mg/dL
RBC / HPF: NONE SEEN RBC/hpf (ref 0–5)
Specific Gravity, Urine: 1.012 (ref 1.005–1.030)
Squamous Epithelial / LPF: NONE SEEN
WBC, UA: NONE SEEN WBC/hpf (ref 0–5)

## 2015-04-05 LAB — CBC
HCT: 34.7 % — ABNORMAL LOW (ref 35.0–47.0)
Hemoglobin: 11.5 g/dL — ABNORMAL LOW (ref 12.0–16.0)
MCH: 30.7 pg (ref 26.0–34.0)
MCHC: 33.2 g/dL (ref 32.0–36.0)
MCV: 92.5 fL (ref 80.0–100.0)
PLATELETS: 209 10*3/uL (ref 150–440)
RBC: 3.75 MIL/uL — ABNORMAL LOW (ref 3.80–5.20)
RDW: 15.3 % — AB (ref 11.5–14.5)
WBC: 5.9 10*3/uL (ref 3.6–11.0)

## 2015-04-05 LAB — BASIC METABOLIC PANEL
Anion gap: 7 (ref 5–15)
BUN: 20 mg/dL (ref 6–20)
CALCIUM: 8.9 mg/dL (ref 8.9–10.3)
CHLORIDE: 108 mmol/L (ref 101–111)
CO2: 30 mmol/L (ref 22–32)
CREATININE: 0.67 mg/dL (ref 0.44–1.00)
GFR calc Af Amer: >60 mL/min (ref >60)
GFR calc non Af Amer: >60 mL/min (ref >60)
Glucose, Bld: 99 mg/dL (ref 65–99)
Potassium: 4.3 mmol/L (ref 3.5–5.1)
SODIUM: 145 mmol/L (ref 135–145)

## 2015-04-05 MED ORDER — ELDERTONIC PO ELIX
15.0000 mL | ORAL_SOLUTION | Freq: Every day | ORAL | Status: DC
  Administered 2015-04-05 – 2015-04-16 (×12): 15 mL via ORAL
  Filled 2015-04-05 (×17): qty 15

## 2015-04-05 NOTE — BHH Counselor (Signed)
Adult Comprehensive Assessment  Patient ID: Sharon Woods, female   DOB: 08-07-1960, 55 y.o.   MRN: [id number]  Information Source: Information source: Patient  Current Stressors:     Living/Environment/Situation:  Living Arrangements: Children, Parent How long has patient lived in current situation?: 08/06/16What is atmosphere in current home: Temporary  Family History:  Marital status: Separated Separated, when?: over 1 year now What types of issues is patient dealing with in the relationship?: dont like to talk about it Are you sexually active?: No What is your sexual orientation?: heterosexual Does patient have children?: Yes How many children?: 2 How is patient's relationship with their children?: good relationship with son 54yo, and a 78 yo son  Childhood History:  By whom was/is the patient raised?: Both parents Additional childhood history information: father passed away Patient's description of current relationship with people who raised him/her: ok ups and downs Does patient have siblings?: Yes Number of Siblings: 3 Description of patient's current relationship with siblings: patient has 2 brothers and a sister passed away in 11-03-2013 Did patient suffer any verbal/emotional/physical/sexual abuse as a child?: No Did patient suffer from severe childhood neglect?: No Has patient ever been sexually abused/assaulted/raped as an adolescent or adult?: No Was the patient ever a victim of a crime or a disaster?: No Witnessed domestic violence?: No Has patient been effected by domestic violence as an adult?: No (just arguments)  Education:  Highest grade of school patient has completed: 9th grade, failed the 10th grade and quit Currently a student?: No Learning disability?: Yes What learning problems does patient have?: a little slow  Employment/Work Situation:   Employment situation: Unemployed Patient's job has been impacted by current illness: Yes Describe  how patient's job has been impacted: patient is not able to work a regular job What is the longest time patient has a held a job?: less than 1 year 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 Has patient ever been in the [employer]?: No Has patient ever served in combat?: No Did You Receive Any Psychiatric Treatment/Services While in Passenger transport manager?: No Are There Guns or Other Weapons in [place]?: No (the guns were put up but I don't know where at) Are These Psychologist, educational?:  (n/a)  Financial Resources:   Financial resources: No income Does patient have a Programmer, applications or guardian?: No  Alcohol/Substance Abuse:   What has been your use of drugs/alcohol within the last 12 months?: denies Alcohol/Substance Abuse Treatment Hx: Denies past history Has alcohol/substance abuse ever caused legal problems?: No  Social Support System:   Pensions consultant Support System: Fair Astronomer System: son, and 2 brothers Type of faith/religion: [place] How does patient's faith help to cope with current illness?: i don't know  Leisure/Recreation:   Leisure and Hobbies: denies  Strengths/Needs:   What things does the patient do well?: patient is not able to report  Discharge Plan:   Does patient have access to transportation?: Yes Will patient be returning to same living situation after discharge?: Yes Currently receiving community mental health services: No If no, would patient like referral for services when discharged?: Yes (What county?) [facility]) Does patient have financial barriers related to discharge medications?: No  Summary/Recommendations:  Patient is a divorced 55 yo Caucasian female admitted for paranoia and delusions. Patient's son Elberta Fortis "Vivien Rota" [place] [phone] is her support. Patient was paranoid and disorganized during assessment. Patient will be referred to [facility] and [facility]. Patient  has only worked a job at most  was less than 1 year and is not aware of receiving disability but has applied. Patient will stabilize on medications and discharge hoe with her family and follow up with outpatient provider. Patient is encouraged to participate in medication management group therapy, and therapeutic milieu.     Keene Breath., MSW, Latanya Presser [phone]  04/05/2015

## 2015-04-05 NOTE — Plan of Care (Signed)
Problem: Diagnosis: Increased Risk For Suicide Attempt Goal: LTG-Patient Will Show Positive Response to Medication LTG (by discharge) : Patient will show positive response to medication and will participate in the development of the discharge plan.  Outcome: Progressing Pt did get up and eat breakfast, had labs drawn

## 2015-04-05 NOTE — BHH Group Notes (Signed)
[place] Group Notes:  (Nursing/MHT/Case Management/Adjunct)  Date:  04/05/2015  Time:  11:22 PM  Type of Therapy:  Evening Wrap-up Group  Participation Level:  Did Not Attend  Participation Quality:  N/A  Affect:  N/A  Cognitive:  N/A  Insight:  None  Engagement in Group:  N/A  Modes of Intervention:  Discussion  Summary of Progress/Problems:  Levonne Spiller 04/05/2015, 11:22 PM

## 2015-04-05 NOTE — Progress Notes (Signed)
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 Started on non emergency forced medications on 04/03/15. The patient is now eating better. She slept better last night after taking higher dose of zyprexa and ativan qhs.   She makes some efforts to answer questions however her answers are short and vague.   Thought blocking still present, and patient appears overwhelmed, distressed. Staff describes her as suspicious and paranoid.   Patient tells me she wants to return home. She also states she does not want to go to any groups because it is hard for her to follow-up on what is being discussed. Patient becomes easily frustrated with questioning she clearly is having great difficulties answering questions but appears to have list of blocking than yesterday.  Oral intake: 100% of all her meals yesterday. Sleep: 9 h Participation in group: none Compliance with medications: 100% compliance with oral meds since 1/3.  Started on  non emergency forced medication on 1/3 VS: stable  Labs: HIV-RPR-Ammonia-B12-TSH ---non reactive/wnl  Per nursing Pt remains paranoid and suspicious. Flat affect. Slow in answering questions when asked.when approached, pt states "I'm scared". Clinical support provided. Pt encouraged to expressed her feelings and concerns. disshelved and malodorous. Refused shower. Did not attend group. Med compliant. Remains hesitant in taking her medications, pt requires lots of encouragement. C/o back pain. Refused tylenol 650 mg po for relief PRN. Will continue to monitor for safety and behavior. Slept 9 hours.   Principal Problem: Severe major depression, single episode, with psychotic features, mood-congruent ([place]) Diagnosis:   Patient Active Problem List   Diagnosis Date Noted  . Severe major depression, single episode, with psychotic features,  mood-congruent ([place]) [F32.3] 03/29/2015  . Protein-calorie malnutrition, severe [E43] 03/26/2015  . 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  Past Medical History:  Past Medical History  Diagnosis Date  . Patient denies medical problems     Past Surgical History  Procedure Laterality Date  . No past surgeries     Family History:  Family History  Problem Relation Age of Onset  . Cancer    . Dementia     Family Psychiatric  History: See H&P.  Social History:  History  Alcohol Use No     History  Drug Use No    Social History   Social History  . Marital Status: Married    Spouse Name: N/A  . Number of Children: N/A  . Years of Education: N/A   Social History Main Topics  . Smoking status: Never Smoker   . Smokeless tobacco: None  . Alcohol Use: No  . Drug Use: No  . Sexual Activity: Not Asked     Comment: unable to assess    Other Topics Concern  . None   Social History Narrative   Additional Social History:    Pain Medications:  (na) Prescriptions:  (na) Over the Counter:  (na) History of alcohol / drug use?: No history of alcohol / drug abuse Negative Consequences of Use: Personal relationships Withdrawal Symptoms: Other (Comment) (na)     Sleep: Good  Appetite:  Good  Current Medications: Current Facility-Administered Medications  Medication Dose Route Frequency Provider Last Rate Last Dose  . acetaminophen (TYLENOL) tablet 650 mg  650 mg Oral Q6H PRN Gonzella Lex, MD   650 mg  at 04/04/15 2144  . alum & mag hydroxide-simeth (MAALOX/MYLANTA) 200-200-20 MG/5ML suspension 30 mL  30 mL Oral Q4H PRN Gonzella Lex, MD      . feeding supplement (ENSURE ENLIVE) (ENSURE ENLIVE) liquid 237 mL  237 mL Oral TID BM Jolanta B Pucilowska, MD   237 mL at 04/05/15 1004  . ibuprofen (ADVIL,MOTRIN) tablet 600 mg  600 mg Oral Once Hildred Priest, MD   600 mg at 04/02/15 1529  .  ibuprofen (ADVIL,MOTRIN) tablet 600 mg  600 mg Oral Q6H PRN Hildred Priest, MD      . LORazepam (ATIVAN) tablet 0.5 mg  0.5 mg Oral QHS Hildred Priest, MD   0.5 mg at 04/04/15 2144  . magnesium hydroxide (MILK OF MAGNESIA) suspension 30 mL  30 mL Oral Daily PRN Gonzella Lex, MD      . mirtazapine (REMERON SOL-TAB) disintegrating tablet 30 mg  30 mg Oral QHS Gonzella Lex, MD   30 mg at 04/04/15 2144  . multivitamin with minerals tablet 1 tablet  1 tablet Oral Daily Hildred Priest, MD   1 tablet at 04/05/15 1003  . OLANZapine (ZYPREXA) injection 10 mg  10 mg Intramuscular BID Hildred Priest, MD   10 mg at 04/03/15 1520  . OLANZapine zydis (ZYPREXA) disintegrating tablet 10 mg  10 mg Oral BID Hildred Priest, MD   10 mg at 04/05/15 1003    Lab Results:  No results found for this or any previous visit (from the past 48 hour(s)).  Physical Findings: AIMS: Facial and Oral Movements Muscles of Facial Expression: None, normal Lips and Perioral Area: None, normal Jaw: None, normal Tongue: None, normal,Extremity Movements Upper (arms, wrists, hands, fingers): None, normal Lower (legs, knees, ankles, toes): None, normal, Trunk Movements Neck, shoulders, hips: None, normal, Overall Severity Severity of abnormal movements (highest score from questions above): None, normal Incapacitation due to abnormal movements: None, normal Patient's awareness of abnormal movements (rate only patient's report): Aware, no distress, Dental Status Current problems with teeth and/or dentures?: No Does patient usually wear dentures?: No  CIWA:  CIWA-Ar Total: 2 COWS:  COWS Total Score: 1  Musculoskeletal: Strength & Muscle Tone: within normal limits Gait & Station: normal Patient leans: N/A  Psychiatric Specialty Exam: Review of Systems  Constitutional: Negative.   HENT: Negative.   Eyes: Negative.   Respiratory: Negative.   Cardiovascular: Negative.    Gastrointestinal: Negative.  Negative for constipation.  Musculoskeletal: Negative for myalgias, back pain, joint pain, falls and neck pain.  Skin: Negative.   Neurological: Negative.   Endo/Heme/Allergies: Negative.   Psychiatric/Behavioral:       Did not answer    Blood pressure 116/76, pulse 77, temperature 98.6 F (37 C), temperature source Oral, resp. rate 18, height 5\' 4"  (1.626 m), weight 48.535 kg (107 lb), SpO2 98 %.Body mass index is 18.36 kg/(m^2).  General Appearance: Disheveled  Eye Contact::  Minimal  Speech:  Blocked and Slow  Volume:  Decreased  Mood:  Depressed  Affect:  Blunt  Thought Process:  Disorganized  Orientation:  Full (Time, Place, and Person)  Thought Content:  WDL  Suicidal Thoughts:  No  Homicidal Thoughts:  No  Memory:  Immediate;   Fair Recent;   Fair Remote;   Fair  Judgement:  Poor  Insight:  Lacking  Psychomotor Activity:  Decreased  Concentration:  Poor  Recall:  Poor  Fund of Knowledge:Poor  Language: Poor  Akathisia:  No  Handed:  Right  AIMS (if  indicated):     Assets:  Communication Skills Desire for Improvement Financial Resources/Insurance Housing Physical Health Resilience Social Support  ADL's:  Intact  Cognition: WNL  Sleep:  Number of Hours: 9   Treatment Plan Summary: Daily contact with patient to assess and evaluate symptoms and progress in treatment and Medication management   Sharon Woods is a 55 year old female with no past psychiatric history admitted for catatonia and altered mental status  Psychosis, depression, catatonia : The patient able to walk and talk.  He answers some questions with monosyllabic answers. Continues to have thought blocking but appears less pronounced today.  She still continues to be very suspicious, appears frightened, and becomes overwhelmed and distressed easily frustrated.  Continue olanzapine zydis 10 mg by mouth twice a day. She refuses olanzapine oral patient is to receive  olanzapine IM 10 mg twice a day. She received injection of olanzapine on Tuesday but since then she has been taking the medication orally.  Continue Remeron 30 mg orally disintegrating tablet daily at bedtime  As patient refuses medications for 3 days.  She was started on non emergency forced medications on 04/03/15.   Insomnia: continue Ativan  0.5 mg daily at bedtime. Slept 9 h last night  Poor oral intake: much improved.  Continue ensure tid.  Continue vitamins q day--will changed to liquid as patient complains of difficulty swallowing. Excellent oral intake. 100% of all her meals yesterday.  UTI. She was given Rocephin on medical floor. We attempted to check UA yesterday but patient is uncooperative.  Constipation: patient received magnesium citrate on 04/03/15.  I will start her on MiraLAX daily.  Back pain: continue ibuprofen prn  Disposition. She will be discharged to home with her family. She will follow up with her local provider.   Labs:RPR, HIV--non reactive vitamin B12 wnl. Ammonia was within the normal limits. Will order routine labs today.   Hildred Priest 04/05/2015, 11:02 AM

## 2015-04-05 NOTE — Progress Notes (Signed)
Recreation Therapy Notes  Date: 01.05.17 Time: 3:00 pm Location: Craft Room  Group Topic: Leisure Education  Goal Area(s) Addresses:  Patient will identify activities for each letter of the alphabet. Patient will verbalize ability to integrate positive leisure into life post d/c. Patient will verbalize ability to use leisure as a Technical sales engineer.  Behavioral Response: Inattentive  Intervention: Leisure Alphabet  Activity: Patients were given a Leisure Air traffic controller and instructed to think of healthy leisure activities for each letter of the alphabet.  Education: LRT educated patients on what they need to participate in leisure activities.  Education Outcome: In group clarification offered   Clinical Observations/Feedback: Patient did not participate in group activity even though LRT explained the activity multiple times and offered to help. Patient did not contribute to group discussion.  Leonette Monarch, LRT/CTRS 04/05/2015 4:43 PM

## 2015-04-05 NOTE — BHH Group Notes (Signed)
[facility] Group Notes:  (Nursing/MHT/Case Management/Adjunct)  Date:  04/05/2015  Time:  2:53 PM  Type of Therapy:  Psychoeducational Skills  Participation Level:  Did Not Attend  Sharon Woods 04/05/2015, 2:53 PM

## 2015-04-05 NOTE — Progress Notes (Signed)
Pt remains paranoid and suspicious. Flat affect. Slow in answering questions when asked.when approached, pt states "I'm scared". Clinical support provided. Pt encouraged to expressed her feelings and concerns. disshelved and malodorous. Refused shower. Did not attend group. Med compliant. Remains hesitant in taking her medications, pt requires lots of encouragement. C/o back pain. Refused tylenol 650 mg po for relief PRN. Will continue to monitor for safety and behavior. Slept 9 hours.

## 2015-04-05 NOTE — Progress Notes (Signed)
Pt appears brighter, got up and walked to breakfast. Med compliant. Continues to isolate to self and room, but interacts more with staff. Denies SI, HI. Complains of pain, does not indicate where pain is. Does not request medication. Encouraged pt to get out of bed and spend time in day room with peers. Pt did attend group today and ate meal in dayroom. Will continue to monitor and assess for safety.

## 2015-04-05 NOTE — BHH Suicide Risk Assessment (Signed)
[place] INPATIENT:  Family/Significant Other Suicide Prevention Education  Suicide Prevention Education:  Education Completed; Elberta Fortis "Vivien Rota" [place] (son) [phone] been identified by the patient as the family member/significant other with whom the patient will be residing, and identified as the person(s) who will aid the patient in the event of a mental health crisis (suicidal ideations/suicide attempt).  With written consent from the patient, the family member/significant other has been provided the following suicide prevention education, prior to the and/or following the discharge of the patient.  The suicide prevention education provided includes the following:  Suicide risk factors  Suicide prevention and interventions  National Suicide Hotline telephone number  [facility] assessment telephone number  [facility] Emergency Assistance [place] and/or Residential Mobile Crisis Unit telephone number  Request made of family/significant other to:  Remove weapons (e.g., guns, rifles, knives), all items previously/currently identified as safety concern.    Remove drugs/medications (over-the-counter, prescriptions, illicit drugs), all items previously/currently identified as a safety concern.  The family member/significant other verbalizes understanding of the suicide prevention education information provided.  The family member/significant other agrees to remove the items of safety concern listed above.  Keene Breath, MSW, LCSWA 04/05/2015, 4:40 PM

## 2015-04-05 NOTE — Tx Team (Signed)
Interdisciplinary Treatment Plan Update (Adult)  Date:  04/05/2015 Time Reviewed:  3:59 PM  Progress in Treatment: Attending groups: Yes. Participating in groups:  No. Taking medication as prescribed:  Yes. Tolerating medication:  Yes. Family/Significant othe contact made:  Yes, individual(s) contacted:  patient's son Elberta Fortis "Vivien Rota" [place] [phone] Patient understands diagnosis:  No. Discussing patient identified problems/goals with staff:  Yes. Medical problems stabilized or resolved:  Yes. Denies suicidal/homicidal ideation: Yes. Issues/concerns per patient self-inventory:  No. Other:  New problem(s) identified: No, Describe:  none reported  Discharge Plan or Barriers: stabilize on medications and discharge home with family and refer to ACTT  Reason for Continuation of Hospitalization: Delusions  Medication stabilization Other; describe paranoia  Comments:patient agreed to bloodwork and will be referred to [facility]  Estimated length of stay:up to 7 days expected discharge Thursday 04/12/15  New goal(s):  Review of initial/current patient goals per problem list:   1.  Goal(s):particiapte in care planning  Met:  No  Target date:by discharge  2.  Goal (s):psychosis will be manageable  Met:  No  Target date:by discharge   Attendees: Physician:  Merlyn Albert, MD 1/5/20173:59 PM  Nursing:   Floyde Parkins, RN 1/5/20173:59 PM  Other:  Carmell Austria, LCSWA 1/5/20173:59 PM  Other:   1/5/20173:59 PM  Other:   1/5/20173:59 PM  Other:  1/5/20173:59 PM  Other:  1/5/20173:59 PM  Other:  1/5/20173:59 PM  Other:  1/5/20173:59 PM  Other:  1/5/20173:59 PM  Other:  1/5/20173:59 PM  Other:   1/5/20173:59 PM   Scribe for Treatment Team:   Keene Breath, MSW, LCSWA [phone]  04/05/2015, 3:59 PM

## 2015-04-05 NOTE — BHH Group Notes (Signed)
[place] Group Notes:  (Nursing/MHT/Case Management/Adjunct)  Date:  04/05/2015  Time:  4:01 AM  Type of Therapy:  Group Therapy  Participation Level:  Did Not Attend   Arlicia Paquette Joy Derral Colucci 04/05/2015, 4:01 AM

## 2015-04-05 NOTE — BHH Group Notes (Signed)
[facility] LCSW Group Therapy   04/05/2015 1pm  Type of Therapy: Group Therapy   Participation Level: Did Not Attend. Patient invited to participate but declined.    Alphonse Guild. Keshawna Dix, MSW, LCSWA, LCAS

## 2015-04-06 MED ORDER — POLYETHYLENE GLYCOL 3350 17 G PO PACK
17.0000 g | PACK | Freq: Every day | ORAL | Status: DC
  Administered 2015-04-06 – 2015-04-09 (×4): 17 g via ORAL
  Filled 2015-04-06 (×4): qty 1

## 2015-04-06 NOTE — BHH Group Notes (Signed)
.  [facility] LCSW Aftercare Discharge Planning Group Note   04/06/2015 9:15 AM  ?  Participation Quality: Alert, Appropriate and Oriented   Mood/Affect: Depressed and Flat   Depression Rating: Pt presented as not able to speak  Anxiety Rating: Pt presented as not able to speak  Thoughts of Suicide: Pt denies SI/HI   Will you contract for safety? Yes   Current AVH: Pt denies   Plan for Discharge/Comments: Pt attended discharge planning group and actively participated in group. CSW provided pt with today's workbook. Pt was prompted directly by the CSW on several occasions as to how she was doing, but pt could not or would not answer, looking downward insteas and presenting as anxious.  After 30 minutes the pt presented as nervous and anxious, stating, "i can't stay, i have to go".  Pt then left the room without looking up.  Transportation Means: Pt did not report access to transportation   Supports: No supports mentioned at this time     Sharon Woods. Sharon Woods, MSW, LCSWA, LCAS

## 2015-04-06 NOTE — Progress Notes (Addendum)
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 Started on non emergency forced medications on 04/03/15. The patient is now eating better.   She makes some efforts to answer questions however her answers are short and vague.   Thought blocking still present, and patient appears overwhelmed, distressed. Staff describes her as suspicious and paranoid.   Patient tells me she wants to be discharged but says she doesn't have a home to return to.  She was seen participating in group today but she tells me it is very difficult for her to keep up with what is being discussed, she does not feel she is understanding anything that is being discussed in the groups.  Staff reports that the patient at times gets confused and tearful. She becomes easily overwhelmed and frustrated.  She tells me she does not feel well but cannot tell me whether it is physically or emotionally. When I ask about physical complaints she reports having constipation but not all her issues.  She says that the medications make her feel "blah".  Thought blocking and seems to be decreasing every day. Today was is here for the patient to answer questions without much delay.  Oral intake: 100% of all her meals yesterday. Sleep: 9 h Participation in group: none Compliance with medications: 100% compliance with oral meds since 1/3.  Started on  non emergency forced medication on 1/3 VS: stable  Labs: HIV-RPR-Ammonia-B12-TSH ---non reactive/wnl.  CBC, basic metabolic panel and UA obtained 1/5 are within the normal limits.  Per nursing Pt appears brighter, got up and walked to breakfast. Med compliant. Continues to isolate to self and room, but interacts more with staff. Denies SI, HI. Complains of pain, does not indicate where pain is. Does not request medication. Encouraged pt to get out of bed and spend time  in day room with peers. Pt did attend group today and ate meal in dayroom. Will continue to monitor and assess for safety.  Principal Problem: Severe major depression, single episode, with psychotic features, mood-congruent ([place]) Diagnosis:   Patient Active Problem List   Diagnosis Date Noted  . Severe major depression, single episode, with psychotic features, mood-congruent ([place]) [F32.3] 03/29/2015  . Protein-calorie malnutrition, severe [E43] 03/26/2015  . 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  Past Medical History:  Past Medical History  Diagnosis Date  . Patient denies medical problems     Past Surgical History  Procedure Laterality Date  . No past surgeries     Family History:  Family History  Problem Relation Age of Onset  . Cancer    . Dementia     Family Psychiatric  History: See H&P.  Social History:  History  Alcohol Use No     History  Drug Use No    Social History   Social History  . Marital Status: Married    Spouse Name: N/A  . Number of Children: N/A  . Years of Education: N/A   Social History Main Topics  . Smoking status: Never Smoker   . Smokeless tobacco: None  . Alcohol Use: No  . Drug Use: No  . Sexual Activity: Not Asked     Comment: unable to assess    Other Topics Concern  . None   Social History Narrative   Additional Social History:  Pain Medications:  (na) Prescriptions:  (na) Over the Counter:  (na) History of alcohol / drug use?: No history of alcohol / drug abuse Negative Consequences of Use: Personal relationships Withdrawal Symptoms: Other (Comment) (na)     Sleep: Good  Appetite:  Good  Current Medications: Current Facility-Administered Medications  Medication Dose Route Frequency Provider Last Rate Last Dose  . acetaminophen (TYLENOL) tablet 650 mg  650 mg Oral Q6H PRN Gonzella Lex, MD   650 mg at 04/04/15 2144  . alum & mag  hydroxide-simeth (MAALOX/MYLANTA) 200-200-20 MG/5ML suspension 30 mL  30 mL Oral Q4H PRN Gonzella Lex, MD      . feeding supplement (ENSURE ENLIVE) (ENSURE ENLIVE) liquid 237 mL  237 mL Oral TID BM Jolanta B Pucilowska, MD   237 mL at 04/05/15 2000  . geriatric multivitamins-minerals (ELDERTONIC/GEVRABON) elixir ELIX 15 mL  15 mL Oral Daily Hildred Priest, MD   15 mL at 04/05/15 1650  . ibuprofen (ADVIL,MOTRIN) tablet 600 mg  600 mg Oral Q6H PRN Hildred Priest, MD      . LORazepam (ATIVAN) tablet 0.5 mg  0.5 mg Oral QHS Hildred Priest, MD   0.5 mg at 04/05/15 2221  . magnesium hydroxide (MILK OF MAGNESIA) suspension 30 mL  30 mL Oral Daily PRN Gonzella Lex, MD      . mirtazapine (REMERON SOL-TAB) disintegrating tablet 30 mg  30 mg Oral QHS Gonzella Lex, MD   30 mg at 04/05/15 2220  . OLANZapine (ZYPREXA) injection 10 mg  10 mg Intramuscular BID Hildred Priest, MD   10 mg at 04/03/15 1520  . OLANZapine zydis (ZYPREXA) disintegrating tablet 10 mg  10 mg Oral BID Hildred Priest, MD   10 mg at 04/05/15 2221    Lab Results:  Results for orders placed or performed during the hospital encounter of 03/29/15 (from the past 48 hour(s))  Urinalysis complete, with microscopic ([facility] only)     Status: Abnormal   Collection Time: 04/05/15 12:45 PM  Result Value Ref Range   Color, Urine YELLOW (A) YELLOW   APPearance CLOUDY (A) CLEAR   Glucose, UA NEGATIVE NEGATIVE mg/dL   Bilirubin Urine NEGATIVE NEGATIVE   Ketones, ur NEGATIVE NEGATIVE mg/dL   Specific Gravity, Urine 1.012 1.005 - 1.030   Hgb urine dipstick NEGATIVE NEGATIVE   pH 7.0 5.0 - 8.0   Protein, ur NEGATIVE NEGATIVE mg/dL   Nitrite NEGATIVE NEGATIVE   Leukocytes, UA NEGATIVE NEGATIVE   RBC / HPF NONE SEEN 0 - 5 RBC/hpf   WBC, UA NONE SEEN 0 - 5 WBC/hpf   Bacteria, UA NONE SEEN NONE SEEN   Squamous Epithelial / LPF NONE SEEN NONE SEEN  Basic metabolic panel     Status: None    Collection Time: 04/05/15  2:05 PM  Result Value Ref Range   Sodium 145 135 - 145 mmol/L   Potassium 4.3 3.5 - 5.1 mmol/L   Chloride 108 101 - 111 mmol/L   CO2 30 22 - 32 mmol/L   Glucose, Bld 99 65 - 99 mg/dL   BUN 20 6 - 20 mg/dL   Creatinine, Ser 0.67 0.44 - 1.00 mg/dL   Calcium 8.9 8.9 - 10.3 mg/dL   GFR calc non Af Amer >60 >60 mL/min   GFR calc Af Amer >60 >60 mL/min    Comment: (NOTE) The eGFR has been calculated using the CKD EPI equation. This calculation has not been validated in all clinical situations. eGFR's persistently <60  mL/min signify possible Chronic Kidney Disease.    Anion gap 7 5 - 15  CBC     Status: Abnormal   Collection Time: 04/05/15  2:05 PM  Result Value Ref Range   WBC 5.9 3.6 - 11.0 K/uL   RBC 3.75 (L) 3.80 - 5.20 MIL/uL   Hemoglobin 11.5 (L) 12.0 - 16.0 g/dL   HCT 34.7 (L) 35.0 - 47.0 %   MCV 92.5 80.0 - 100.0 fL   MCH 30.7 26.0 - 34.0 pg   MCHC 33.2 32.0 - 36.0 g/dL   RDW 15.3 (H) 11.5 - 14.5 %   Platelets 209 150 - 440 K/uL    Physical Findings: AIMS: Facial and Oral Movements Muscles of Facial Expression: None, normal Lips and Perioral Area: None, normal Jaw: None, normal Tongue: None, normal,Extremity Movements Upper (arms, wrists, hands, fingers): None, normal Lower (legs, knees, ankles, toes): None, normal, Trunk Movements Neck, shoulders, hips: None, normal, Overall Severity Severity of abnormal movements (highest score from questions above): None, normal Incapacitation due to abnormal movements: None, normal Patient's awareness of abnormal movements (rate only patient's report): Aware, no distress, Dental Status Current problems with teeth and/or dentures?: No Does patient usually wear dentures?: No  CIWA:  CIWA-Ar Total: 2 COWS:  COWS Total Score: 1  Musculoskeletal: Strength & Muscle Tone: within normal limits Gait & Station: normal Patient leans: N/A  Psychiatric Specialty Exam: Review of Systems  Constitutional:  Negative.   HENT: Negative.   Eyes: Negative.   Respiratory: Negative.   Cardiovascular: Negative.   Gastrointestinal: Negative.  Negative for constipation.  Musculoskeletal: Positive for back pain. Negative for myalgias, joint pain, falls and neck pain.  Skin: Negative.   Neurological: Negative.   Endo/Heme/Allergies: Negative.   Psychiatric/Behavioral: Negative.     Blood pressure 127/90, pulse 73, temperature 98.6 F (37 C), temperature source Oral, resp. rate 18, height 5' 4"  (1.626 m), weight 48.535 kg (107 lb), SpO2 98 %.Body mass index is 18.36 kg/(m^2).  General Appearance: Disheveled  Eye Contact::  Minimal  Speech:  Blocked and Slow  Volume:  Decreased  Mood:  Depressed  Affect:  Blunt  Thought Process:  Disorganized  Orientation:  Full (Time, Place, and Person)  Thought Content:  WDL  Suicidal Thoughts:  No  Homicidal Thoughts:  No  Memory:  Immediate;   Fair Recent;   Fair Remote;   Fair  Judgement:  Poor  Insight:  Lacking  Psychomotor Activity:  Decreased  Concentration:  Poor  Recall:  Poor  Fund of Knowledge:Poor  Language: Poor  Akathisia:  No  Handed:  Right  AIMS (if indicated):     Assets:  Communication Skills Desire for Improvement Financial Resources/Insurance Housing Physical Health Resilience Social Support  ADL's:  Intact  Cognition: WNL  Sleep:  Number of Hours: 9   Treatment Plan Summary: Daily contact with patient to assess and evaluate symptoms and progress in treatment and Medication management   Sharon Woods is a 55 year old female with no past psychiatric history admitted for catatonia and altered mental status  Psychosis, depression, catatonia : The patient able to walk and talk.  He answers some questions with monosyllabic answers. Continues to have thought blocking but appears less pronounced today.  She still continues to be very suspicious, appears frightened, and becomes overwhelmed and distressed easily frustrated.    Continue olanzapine zydis 10 mg by mouth twice a day. She refuses olanzapine oral patient is to receive olanzapine IM 10 mg twice a day. She  received injection of olanzapine on Tuesday but since then she has been taking the medication orally.  Continue Remeron 30 mg orally disintegrating tablet daily at bedtime  As patient refuses medications for 3 days.  She was started on non emergency forced medications on 04/03/15.   Insomnia: continue Ativan  0.5 mg daily at bedtime. Slept 9 h last night  Poor oral intake: much improved.  Continue ensure tid.  Continue vitamins q day--will changed to liquid as patient complains of difficulty swallowing. Excellent oral intake. 100% of all her meals yesterday and the day before yesterday  UTI. She was given Rocephin on medical floor. We were able to recheck UA on January 6. UA was clear  Constipation: patient received magnesium citrate on 04/03/15.  Continue MiraLAX daily  Back pain: continue ibuprofen prn  Disposition. She will be discharged to home with her family. She will follow up with her local provider.   Labs:RPR, HIV--non reactive vitamin B12 wnl. Ammonia was within the normal limits. CBC, basic metabolic panel and UA completed on January 5 and are within the normal limits  Spoke with her son today,Tony, [phone]. He reports that she did have a history of "mood swings, she was given treatment after one of her pregnancies. He reports that she has been doing well up until the summer when Tony's son was placed in foster care and then the patient's sister passed away. The patient and son became homeless for 2 months and had to leave at the homeless shelter. They are now living with the patient's mother.   Some improvement noted this week. No changes  on medications  Hildred Priest 04/06/2015, 8:58 AM

## 2015-04-06 NOTE — [facility] Group Notes (Signed)
[place] LCSW Group Therapy   04/06/2015 1pm  Type of Therapy: Group Therapy   Participation Level: Did Not Attend. Patient invited to participate but declined.    Alphonse Guild. Jaunita Mikels, MSW, LCSWA, LCAS

## 2015-04-06 NOTE — Progress Notes (Signed)
Recreation Therapy Notes  Date: 01.06.17 Time: 3:00 pm Location: Craft Room  Group Topic: Coping Skills  Goal Area(s) Addresses:  Patient will participate in healthy coping skill.  Behavioral Response: Left early  Intervention: Coloring   Activity: Patients were given coloring sheets and instructed to color and think of what emotions they were feeling and what they were focused on.  Education: LRT educated patients on healthy coping skills.  Education Outcome: Patient left before LRT educated group.  Clinical Observations/Feedback: Patient did not participate in group activity. Patient left group at approximately 3:17 pm. Patient did not return to group.  Leonette Monarch, LRT/CTRS 04/06/2015 4:56 PM

## 2015-04-07 NOTE — Plan of Care (Signed)
Problem: Alteration in mood Goal: LTG-Patient reports reduction in suicidal thoughts (Patient reports reduction in suicidal thoughts and is able to verbalize a safety plan for whenever patient is feeling suicidal)  Outcome: Progressing Patient denies SI/HI.      

## 2015-04-07 NOTE — Progress Notes (Signed)
Patient denies SI.  Medication compliant although she questions all her medications and asks how many times or how long she is going to have to take them.  More visible on the unit.  Interacts with peers when approached.  Does not initiate any conversations.  Safety maintained.  Support and encouragement offered.

## 2015-04-07 NOTE — Plan of Care (Signed)
Problem: Diagnosis: Increased Risk For Suicide Attempt Goal: LTG-Patient Will Report Improved Mood and Deny Suicidal LTG (by discharge) Patient will report improved mood and deny suicidal ideation.  Outcome: Progressing Denies SI

## 2015-04-07 NOTE — [facility] Group Notes (Signed)
[facility] LCSW Group Therapy  04/07/2015 1:58 PM  Type of Therapy:  Group Therapy  Participation Level:  Did Not Attend  Modes of Intervention:  Discussion, Education, Socialization and Support  Summary of Progress/Problems: Balance in life: Patients will discuss the concept of balance and how it looks and feels to be unbalanced. Pt will identify areas in their life that is unbalanced and ways to become more balanced.    Sharon Woods L Sharon Woods MSW, LCSWA  04/07/2015, 1:58 PM  

## 2015-04-07 NOTE — Progress Notes (Signed)
D: Pt denies SI/HI/AVH. Pt is suspicious of staff at times and sometimes hesitant with patient care. Patient appears less anxious and she is interacting with peers and staff appropriately.  A: Pt was offered support and encouragement. Pt was given scheduled medications. Pt was encouraged to attend groups. Q 15 minute checks were done for safety.  R:Pt attends groups and interacts well with peers and staff. Pt is taking medication. Pt has no complaints. Safety maintained on unit.

## 2015-04-08 MED ORDER — BISACODYL 5 MG PO TBEC
5.0000 mg | DELAYED_RELEASE_TABLET | Freq: Every day | ORAL | Status: DC | PRN
  Administered 2015-04-08: 5 mg via ORAL
  Filled 2015-04-08 (×3): qty 1

## 2015-04-08 NOTE — Progress Notes (Signed)
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 Started on non emergency forced medications on 04/03/15. The patient is now eating better.   Patient states that she is having issues with constipation. She feels "not too good because I can't go to the bathroom." She endorsed feeling afraid when she is in the room with the door shut, so she prefers to leave it open. She could not describe what her fear is related to. She states "I don't know" to many answers. When asked what her mood was, she stated "it's different ones."  She denies SI/H. Endorses AH. Denies VH. She also mentioned being concerned about how she will get home from the hospital.     No side effects from medication   Per nursing:  Patient is pleasant and cooperative. No needs or distress noted, however she does state she is lonely. She is med compliant with prompting and education. Denies SI, HI, and AVH.          Principal Problem: Severe major depression, single episode, with psychotic features, mood-congruent ([place]) Diagnosis:   Patient Active Problem List   Diagnosis Date Noted  . Severe major depression, single episode, with psychotic features, mood-congruent ([place]) [F32.3] 03/29/2015  . Protein-calorie malnutrition, severe [E43] 03/26/2015  . 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  Past Medical History:  Past Medical History  Diagnosis Date  . Patient denies medical problems     Past Surgical History  Procedure Laterality Date  . No past surgeries     Family History:  Family History  Problem Relation Age of Onset  . Cancer    . Dementia     Family Psychiatric  History: See H&P.  Social History:  History  Alcohol Use No     History  Drug Use No    Social History   Social History  . Marital  Status: Married    Spouse Name: N/A  . Number of Children: N/A  . Years of Education: N/A   Social History Main Topics  . Smoking status: Never Smoker   . Smokeless tobacco: None  . Alcohol Use: No  . Drug Use: No  . Sexual Activity: Not Asked     Comment: unable to assess    Other Topics Concern  . None   Social History Narrative   Additional Social History:    Pain Medications:  (na) Prescriptions:  (na) Over the Counter:  (na) History of alcohol / drug use?: No history of alcohol / drug abuse Negative Consequences of Use: Personal relationships Withdrawal Symptoms: Other (Comment) (na)     Sleep: Good  Appetite:  Good  Current Medications: Current Facility-Administered Medications  Medication Dose Route Frequency Provider Last Rate Last Dose  . acetaminophen (TYLENOL) tablet 650 mg  650 mg Oral Q6H PRN Gonzella Lex, MD   650 mg at 04/04/15 2144  . alum & mag hydroxide-simeth (MAALOX/MYLANTA) 200-200-20 MG/5ML suspension 30 mL  30 mL Oral Q4H PRN Gonzella Lex, MD      . bisacodyl (DULCOLAX) EC tablet 5 mg  5 mg Oral Daily PRN Preesha Benjamin L Navah Grondin, MD      . feeding supplement (ENSURE ENLIVE) (ENSURE ENLIVE) liquid 237 mL  237 mL Oral TID BM Jolanta B Pucilowska, MD   237 mL at 04/08/15 1000  .  geriatric multivitamins-minerals (ELDERTONIC/GEVRABON) elixir ELIX 15 mL  15 mL Oral Daily Hildred Priest, MD   15 mL at 04/08/15 0958  . ibuprofen (ADVIL,MOTRIN) tablet 600 mg  600 mg Oral Q6H PRN Hildred Priest, MD      . LORazepam (ATIVAN) tablet 0.5 mg  0.5 mg Oral QHS Hildred Priest, MD   0.5 mg at 04/07/15 2222  . magnesium hydroxide (MILK OF MAGNESIA) suspension 30 mL  30 mL Oral Daily PRN Gonzella Lex, MD      . mirtazapine (REMERON SOL-TAB) disintegrating tablet 30 mg  30 mg Oral QHS Gonzella Lex, MD   30 mg at 04/07/15 2222  . OLANZapine (ZYPREXA) injection 10 mg  10 mg Intramuscular BID Hildred Priest, MD   10 mg at 04/03/15  1520  . OLANZapine zydis (ZYPREXA) disintegrating tablet 10 mg  10 mg Oral BID Hildred Priest, MD   10 mg at 04/08/15 0958  . polyethylene glycol (MIRALAX / GLYCOLAX) packet 17 g  17 g Oral Daily Hildred Priest, MD   17 g at 04/08/15 [id number]    Lab Results:  No results found for this or any previous visit (from the past 48 hour(s)).  Physical Findings: AIMS: Facial and Oral Movements Muscles of Facial Expression: None, normal Lips and Perioral Area: None, normal Jaw: None, normal Tongue: None, normal,Extremity Movements Upper (arms, wrists, hands, fingers): None, normal Lower (legs, knees, ankles, toes): None, normal, Trunk Movements Neck, shoulders, hips: None, normal, Overall Severity Severity of abnormal movements (highest score from questions above): None, normal Incapacitation due to abnormal movements: None, normal Patient's awareness of abnormal movements (rate only patient's report): Aware, no distress, Dental Status Current problems with teeth and/or dentures?: No Does patient usually wear dentures?: No  CIWA:  CIWA-Ar Total: 2 COWS:  COWS Total Score: 1  Musculoskeletal: Strength & Muscle Tone: within normal limits Gait & Station: normal Patient leans: N/A  Psychiatric Specialty Exam: Review of Systems  Constitutional: Negative.   HENT: Negative.   Eyes: Negative.   Respiratory: Negative.   Cardiovascular: Negative.   Gastrointestinal: Negative.  Negative for constipation.  Musculoskeletal: Positive for back pain. Negative for myalgias, joint pain, falls and neck pain.  Skin: Negative.   Neurological: Negative.   Endo/Heme/Allergies: Negative.   Psychiatric/Behavioral: Negative.     Blood pressure 135/85, pulse 75, temperature 98.2 F (36.8 C), temperature source Oral, resp. rate 18, height 5\' 4"  (1.626 m), weight 48.535 kg (107 lb), SpO2 98 %.Body mass index is 18.36 kg/(m^2).  General Appearance: Disheveled  Eye Sport and exercise psychologist::  Fair  Speech:   Clear and Coherent and Normal Rate  Volume:  Normal  Mood:  Anxious and Depressed  Affect:  Blunt and odd  Thought Process:  Disorganized  Orientation:  Full (Time, Place, and Person)  Thought Content:  Hallucinations: Auditory  Suicidal Thoughts:  No  Homicidal Thoughts:  No  Memory:  Immediate;   Fair Recent;   Fair Remote;   Fair  Judgement:  Poor  Insight:  Lacking and Shallow  Psychomotor Activity:  Decreased  Concentration:  Poor  Recall:  Poor  Fund of Knowledge:Poor  Language: Poor  Akathisia:  No  Handed:  Right  AIMS (if indicated):     Assets:  Communication Skills Desire for Improvement Financial Resources/Insurance Housing Physical Health Resilience Social Support  ADL's:  Intact  Cognition: WNL  Sleep:  Number of Hours: 7   Treatment Plan Summary: Daily contact with patient to assess and evaluate symptoms and progress  in treatment and Medication management   Ms. Yong Channel is a 55 year old female with no past psychiatric history admitted for catatonia and altered mental status  Psychosis, depression, catatonia : The patient able to walk and talk.  she answers some questions with monosyllabic answers. Continues to have thought blocking but appears less pronounced today.  She still continues to be very suspicious, appears frightened, and becomes overwhelmed and distressed easily frustrated.   Continue olanzapine zydis 10 mg by mouth twice a day. She refuses olanzapine oral patient is to receive olanzapine IM 10 mg twice a day. She received injection of olanzapine on Tuesday but since then she has been taking the medication orally.  Continue Remeron 30 mg orally disintegrating tablet daily at bedtime  As patient refuses medications for 3 days.  She was started on non emergency forced medications on 04/03/15.   Insomnia: continue Ativan  0.5 mg daily at bedtime.   Poor oral intake: much improved.  Continue ensure tid.  Continue vitamins q day--will changed to  liquid as patient complains of difficulty swallowing. Excellent oral intake. 100% of all her meals yesterday and the day before yesterday  UTI. She was given Rocephin on medical floor. We were able to recheck UA on January 6. UA was clear  Constipation: patient received magnesium citrate on 04/03/15.  Continue MiraLAX daily Dulcolax 04/08/15 for constipation  Back pain: continue ibuprofen prn  Disposition. She will be discharged to home with her family. She will follow up with her local provider.   Labs:RPR, HIV--non reactive vitamin B12 wnl. Ammonia was within the normal limits. CBC, basic metabolic panel and UA completed on January 5 and are within the normal limits  Spoke with her son ,[name], [phone]. He reports that she did have a history of "mood swings, she was given treatment after one of her pregnancies. He reports that she has been doing well up until the summer when Tony's son was placed in foster care and then the patient's sister passed away. The patient and son became homeless for 2 months and had to leave at the homeless shelter. They are now living with the patient's mother.  1/7: No change to above plan 1/8: Added Dulcolax for constipation. Patient already took miralax with no effect.   Some improvement noted this week. No changes  on medications  Elektra Wartman,  Adyen Bifulco L 04/07/15

## 2015-04-08 NOTE — [facility] Group Notes (Signed)
[place] LCSW Group Therapy  04/08/2015 2:26 PM  Type of Therapy:  Group Therapy  Participation Level:  Did Not Attend  Modes of Intervention:  Discussion, Education, Socialization and Support  Summary of Progress/Problems: Todays topic: Grudges  Patients will be encouraged to discuss their thoughts, feelings, and behaviors as to why one holds on to grudges and reasons why people have grudges. Patients will process the impact of grudges on their daily lives and identify thoughts and feelings related to holding grudges. Patients will identify feelings and thoughts related to what life would look like without grudges.   [place] MSW, [place]  04/08/2015, 2:26 PM

## 2015-04-08 NOTE — Progress Notes (Signed)
Patient is pleasant and cooperative. No needs or distress noted, however she does state she is lonely. She is med compliant with prompting and education. Denies SI, HI, and AVH.

## 2015-04-08 NOTE — Plan of Care (Signed)
Problem: Alteration in mood Goal: LTG-Patient reports reduction in suicidal thoughts (Patient reports reduction in suicidal thoughts and is able to verbalize a safety plan for whenever patient is feeling suicidal)  Outcome: Progressing Pt denies current SI.  Goal: LTG-Pt's behavior demonstrates decreased signs of depression (Patient's behavior demonstrates decreased signs of depression to the point the patient is safe to return home and continue treatment in an outpatient setting)  Outcome: Progressing Pt out of her room more, med compliant, and states interest in being around other people.

## 2015-04-08 NOTE — Plan of Care (Signed)
Problem: Alteration in mood Goal: LTG-Pt's behavior demonstrates decreased signs of depression (Patient's behavior demonstrates decreased signs of depression to the point the patient is safe to return home and continue treatment in an outpatient setting)  Outcome: Progressing Denies depression     

## 2015-04-08 NOTE — Plan of Care (Signed)
Problem: Alteration in mood Goal: LTG-Patient reports reduction in suicidal thoughts (Patient reports reduction in suicidal thoughts and is able to verbalize a safety plan for whenever patient is feeling suicidal)  Outcome: Progressing Denies SI     

## 2015-04-08 NOTE — Progress Notes (Signed)
Denies SI.  Affect flat, no answer given when asked if sad or depressed.  Verbalizes that she can't use the bathroom.  Dr. Gloriann Loan informed and orders received.  Isolate to room.  Out of room with encouragement. Medication compliant with education.  Safety maintained.  Support and encouragement offered.

## 2015-04-08 NOTE — Progress Notes (Signed)
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 Started on non emergency forced medications on 04/03/15. The patient is now eating better.   Mood -" not happy and something else." Worried about where she will live when she leaves.  Denies SI "not as far as I know." Shrugged shoulders when asked about HI. When asked what her goal is for today she stated "Walking up and down the hallway." Flat affect. Sore in left arm for shots.   States that she is eating "what I can." Had her entire breakfast today. Slept off and on all night.   No side effects from medication   Per nursing: Patient denies SI. Medication compliant although she questions all her medications and asks how many times or how long she is going to have to take them. More visible on the unit. Interacts with peers when approached. Does not initiate any conversations. Safety maintained. Support and encouragement offered.   Principal Problem: Severe major depression, single episode, with psychotic features, mood-congruent ([place]) Diagnosis:   Patient Active Problem List   Diagnosis Date Noted  . Severe major depression, single episode, with psychotic features, mood-congruent ([place]) [F32.3] 03/29/2015  . Protein-calorie malnutrition, severe [E43] 03/26/2015  . 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  Past Medical History:  Past Medical History  Diagnosis Date  . Patient denies medical problems     Past Surgical History  Procedure Laterality Date  . No past surgeries     Family History:  Family History  Problem Relation Age of Onset  . Cancer    . Dementia     Family Psychiatric  History: See H&P.  Social History:  History  Alcohol Use No     History  Drug Use No    Social History   Social History  .  Marital Status: Married    Spouse Name: N/A  . Number of Children: N/A  . Years of Education: N/A   Social History Main Topics  . Smoking status: Never Smoker   . Smokeless tobacco: None  . Alcohol Use: No  . Drug Use: No  . Sexual Activity: Not Asked     Comment: unable to assess    Other Topics Concern  . None   Social History Narrative   Additional Social History:    Pain Medications:  (na) Prescriptions:  (na) Over the Counter:  (na) History of alcohol / drug use?: No history of alcohol / drug abuse Negative Consequences of Use: Personal relationships Withdrawal Symptoms: Other (Comment) (na)     Sleep: Good  Appetite:  Good  Current Medications: Current Facility-Administered Medications  Medication Dose Route Frequency Provider Last Rate Last Dose  . acetaminophen (TYLENOL) tablet 650 mg  650 mg Oral Q6H PRN Gonzella Lex, MD   650 mg at 04/04/15 2144  . alum & mag hydroxide-simeth (MAALOX/MYLANTA) 200-200-20 MG/5ML suspension 30 mL  30 mL Oral Q4H PRN Gonzella Lex, MD      . feeding supplement (ENSURE ENLIVE) (ENSURE ENLIVE) liquid 237 mL  237 mL Oral TID BM Jolanta B Pucilowska, MD   237 mL at 04/07/15 1400  . geriatric multivitamins-minerals (ELDERTONIC/GEVRABON) elixir ELIX 15 mL  15 mL Oral Daily Hildred Priest, MD   15 mL at 04/07/15 1004  . ibuprofen (ADVIL,MOTRIN) tablet 600 mg  600 mg Oral Q6H PRN Hildred Priest, MD      . LORazepam (ATIVAN) tablet 0.5 mg  0.5 mg Oral QHS Hildred Priest, MD   0.5 mg at 04/07/15 2222  . magnesium hydroxide (MILK OF MAGNESIA) suspension 30 mL  30 mL Oral Daily PRN Gonzella Lex, MD      . mirtazapine (REMERON SOL-TAB) disintegrating tablet 30 mg  30 mg Oral QHS Gonzella Lex, MD   30 mg at 04/07/15 2222  . OLANZapine (ZYPREXA) injection 10 mg  10 mg Intramuscular BID Hildred Priest, MD   10 mg at 04/03/15 1520  . OLANZapine zydis (ZYPREXA) disintegrating tablet 10 mg  10 mg Oral  BID Hildred Priest, MD   10 mg at 04/07/15 2222  . polyethylene glycol (MIRALAX / GLYCOLAX) packet 17 g  17 g Oral Daily Hildred Priest, MD   17 g at 04/07/15 1005    Lab Results:  No results found for this or any previous visit (from the past 48 hour(s)).  Physical Findings: AIMS: Facial and Oral Movements Muscles of Facial Expression: None, normal Lips and Perioral Area: None, normal Jaw: None, normal Tongue: None, normal,Extremity Movements Upper (arms, wrists, hands, fingers): None, normal Lower (legs, knees, ankles, toes): None, normal, Trunk Movements Neck, shoulders, hips: None, normal, Overall Severity Severity of abnormal movements (highest score from questions above): None, normal Incapacitation due to abnormal movements: None, normal Patient's awareness of abnormal movements (rate only patient's report): Aware, no distress, Dental Status Current problems with teeth and/or dentures?: No Does patient usually wear dentures?: No  CIWA:  CIWA-Ar Total: 2 COWS:  COWS Total Score: 1  Musculoskeletal: Strength & Muscle Tone: within normal limits Gait & Station: normal Patient leans: N/A  Psychiatric Specialty Exam: Review of Systems  Constitutional: Negative.   HENT: Negative.   Eyes: Negative.   Respiratory: Negative.   Cardiovascular: Negative.   Gastrointestinal: Negative.  Negative for constipation.  Musculoskeletal: Positive for back pain. Negative for myalgias, joint pain, falls and neck pain.  Skin: Negative.   Neurological: Negative.   Endo/Heme/Allergies: Negative.   Psychiatric/Behavioral: Negative.     Blood pressure 122/82, pulse 71, temperature 98.2 F (36.8 C), temperature source Oral, resp. rate 20, height 5\' 4"  (1.626 m), weight 48.535 kg (107 lb), SpO2 98 %.Body mass index is 18.36 kg/(m^2).  General Appearance: Disheveled  Eye Contact::  Minimal  Speech:  Blocked and Slow  Volume:  Decreased  Mood:  Anxious and Depressed   Affect:  Blunt  Thought Process:  Disorganized  Orientation:  Full (Time, Place, and Person)  Thought Content:  Hallucinations: None  Suicidal Thoughts:  No  Homicidal Thoughts:  No  Memory:  Immediate;   Fair Recent;   Fair Remote;   Fair  Judgement:  Poor  Insight:  Lacking and Shallow  Psychomotor Activity:  Decreased  Concentration:  Poor  Recall:  Poor  Fund of Knowledge:Poor  Language: Poor  Akathisia:  No  Handed:  Right  AIMS (if indicated):     Assets:  Communication Skills Desire for Improvement Financial Resources/Insurance Housing Physical Health Resilience Social Support  ADL's:  Intact  Cognition: WNL  Sleep:  Number of Hours: 8.15   Treatment Plan Summary: Daily contact with patient to assess and evaluate symptoms and progress in treatment and Medication management   Ms. Yong Channel is a 55 year old female with no past psychiatric history admitted for catatonia and altered mental status  Psychosis, depression, catatonia : The patient able to walk  and talk.  He answers some questions with monosyllabic answers. Continues to have thought blocking but appears less pronounced today.  She still continues to be very suspicious, appears frightened, and becomes overwhelmed and distressed easily frustrated.   Continue olanzapine zydis 10 mg by mouth twice a day. She refuses olanzapine oral patient is to receive olanzapine IM 10 mg twice a day. She received injection of olanzapine on Tuesday but since then she has been taking the medication orally.  Continue Remeron 30 mg orally disintegrating tablet daily at bedtime  As patient refuses medications for 3 days.  She was started on non emergency forced medications on 04/03/15.   Insomnia: continue Ativan  0.5 mg daily at bedtime. Slept 8 h last night  Poor oral intake: much improved.  Continue ensure tid.  Continue vitamins q day--will changed to liquid as patient complains of difficulty swallowing. Excellent oral intake.  100% of all her meals yesterday and the day before yesterday  UTI. She was given Rocephin on medical floor. We were able to recheck UA on January 6. UA was clear  Constipation: patient received magnesium citrate on 04/03/15.  Continue MiraLAX daily  Back pain: continue ibuprofen prn  Disposition. She will be discharged to home with her family. She will follow up with her local provider.   Labs:RPR, HIV--non reactive vitamin B12 wnl. Ammonia was within the normal limits. CBC, basic metabolic panel and UA completed on January 5 and are within the normal limits  Spoke with her son ,[name], [phone]. He reports that she did have a history of "mood swings, she was given treatment after one of her pregnancies. He reports that she has been doing well up until the summer when Tony's son was placed in foster care and then the patient's sister passed away. The patient and son became homeless for 2 months and had to leave at the homeless shelter. They are now living with the patient's mother.  1/7: No change to above plan  Some improvement noted this week. No changes  on medications  Philopateer Strine,  Kyley Laurel L 04/07/15

## 2015-04-09 MED ORDER — OLANZAPINE 5 MG PO TBDP
20.0000 mg | ORAL_TABLET | Freq: Once | ORAL | Status: AC
  Administered 2015-04-09: 20 mg via ORAL
  Filled 2015-04-09: qty 4

## 2015-04-09 MED ORDER — OLANZAPINE 5 MG PO TBDP
30.0000 mg | ORAL_TABLET | Freq: Every day | ORAL | Status: DC
  Administered 2015-04-10 – 2015-04-15 (×6): 30 mg via ORAL
  Filled 2015-04-09 (×6): qty 6

## 2015-04-09 MED ORDER — SENNOSIDES-DOCUSATE SODIUM 8.6-50 MG PO TABS
2.0000 | ORAL_TABLET | Freq: Every day | ORAL | Status: DC
  Administered 2015-04-11 – 2015-04-14 (×4): 2 via ORAL
  Filled 2015-04-09 (×6): qty 2

## 2015-04-09 MED ORDER — DOCUSATE SODIUM 100 MG PO CAPS
200.0000 mg | ORAL_CAPSULE | Freq: Two times a day (BID) | ORAL | Status: DC
  Administered 2015-04-09 – 2015-04-14 (×11): 200 mg via ORAL
  Filled 2015-04-09 (×12): qty 2

## 2015-04-09 MED ORDER — MAGNESIUM CITRATE PO SOLN
0.5000 | Freq: Once | ORAL | Status: AC
  Administered 2015-04-09: 0.5 via ORAL
  Filled 2015-04-09: qty 296

## 2015-04-09 NOTE — Progress Notes (Signed)
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 Started on non emergency forced medications on 04/03/15. The patient is now eating better.    Mood is described as very anxious and nervous but doesn't know why. Feels she is going to be locked in her room if the door closes.  Says she is unable to answer the paper questionnaires given by the nurses, says she doesn't understand the questions. She continues to assess her for her to follow what is being discussed in groups.  Denies SI, HI or auditory or visual hallucinations. Oral intake has been excellent patient has been eating between 9-00% of her meals.  No side effects from medication.  As far as physical complaints continues to report severe constipation that has not responded to MiraLAX or Dulcolax.   Per nursing:  Patient denies SI. Patient affect is flat. Suspicious. Patient c/o of constipation. PRN medications given previous shift, with no results. Patient encouraged to drink fluids. Patient med compliant this shift. Patient states that she thinks too much but would not elaborate. Patient states that she is ready to go home. No unsafe behaviors noted. Will continue to monitor. Patient slept 6.5 hours.          Principal Problem: Severe major depression, single episode, with psychotic features, mood-congruent ([place]) Diagnosis:   Patient Active Problem List   Diagnosis Date Noted  . Severe major depression, single episode, with psychotic features, mood-congruent ([place]) [F32.3] 03/29/2015  . Protein-calorie malnutrition, severe [E43] 03/26/2015  . 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  Past Medical History:  Past Medical History  Diagnosis Date  . Patient denies medical problems     Past Surgical History  Procedure  Laterality Date  . No past surgeries     Family History:  Family History  Problem Relation Age of Onset  . Cancer    . Dementia     Family Psychiatric  History: See H&P.  Social History:  History  Alcohol Use No     History  Drug Use No    Social History   Social History  . Marital Status: Married    Spouse Name: N/A  . Number of Children: N/A  . Years of Education: N/A   Social History Main Topics  . Smoking status: Never Smoker   . Smokeless tobacco: None  . Alcohol Use: No  . Drug Use: No  . Sexual Activity: Not Asked     Comment: unable to assess    Other Topics Concern  . None   Social History Narrative   Additional Social History:    Pain Medications:  (na) Prescriptions:  (na) Over the Counter:  (na) History of alcohol / drug use?: No history of alcohol / drug abuse Negative Consequences of Use: Personal relationships Withdrawal Symptoms: Other (Comment) (na)     Sleep: Good  Appetite:  Good  Current Medications: Current Facility-Administered Medications  Medication Dose Route Frequency Provider Last Rate Last Dose  . acetaminophen (TYLENOL) tablet 650 mg  650 mg Oral Q6H PRN Gonzella Lex, MD   650 mg at 04/04/15 2144  . alum & mag hydroxide-simeth (MAALOX/MYLANTA) 200-200-20 MG/5ML suspension 30 mL  30 mL Oral Q4H PRN Gonzella Lex, MD      . docusate sodium (COLACE) capsule 200 mg  200  mg Oral BID Hildred Priest, MD      . feeding supplement (ENSURE ENLIVE) (ENSURE ENLIVE) liquid 237 mL  237 mL Oral TID BM Jolanta B Pucilowska, MD   237 mL at 04/09/15 1000  . geriatric multivitamins-minerals (ELDERTONIC/GEVRABON) elixir ELIX 15 mL  15 mL Oral Daily Hildred Priest, MD   15 mL at 04/09/15 0929  . ibuprofen (ADVIL,MOTRIN) tablet 600 mg  600 mg Oral Q6H PRN Hildred Priest, MD      . LORazepam (ATIVAN) tablet 0.5 mg  0.5 mg Oral QHS Hildred Priest, MD   0.5 mg at 04/08/15 2113  . magnesium citrate  solution 0.5 Bottle  0.5 Bottle Oral Once Hildred Priest, MD      . magnesium hydroxide (MILK OF MAGNESIA) suspension 30 mL  30 mL Oral Daily PRN Gonzella Lex, MD      . mirtazapine (REMERON SOL-TAB) disintegrating tablet 30 mg  30 mg Oral QHS Gonzella Lex, MD   30 mg at 04/08/15 2113  . OLANZapine zydis (ZYPREXA) disintegrating tablet 20 mg  20 mg Oral Once Hildred Priest, MD      . Derrill Memo ON 04/10/2015] OLANZapine zydis (ZYPREXA) disintegrating tablet 30 mg  30 mg Oral QHS Hildred Priest, MD      . Derrill Memo ON 04/10/2015] senna-docusate (Senokot-S) tablet 2 tablet  2 tablet Oral QHS Hildred Priest, MD        Lab Results:  No results found for this or any previous visit (from the past 48 hour(s)).  Physical Findings: AIMS: Facial and Oral Movements Muscles of Facial Expression: None, normal Lips and Perioral Area: None, normal Jaw: None, normal Tongue: None, normal,Extremity Movements Upper (arms, wrists, hands, fingers): None, normal Lower (legs, knees, ankles, toes): None, normal, Trunk Movements Neck, shoulders, hips: None, normal, Overall Severity Severity of abnormal movements (highest score from questions above): None, normal Incapacitation due to abnormal movements: None, normal Patient's awareness of abnormal movements (rate only patient's report): Aware, no distress, Dental Status Current problems with teeth and/or dentures?: No Does patient usually wear dentures?: No  CIWA:  CIWA-Ar Total: 2 COWS:  COWS Total Score: 1  Musculoskeletal: Strength & Muscle Tone: within normal limits Gait & Station: normal Patient leans: N/A  Psychiatric Specialty Exam: Review of Systems  Constitutional: Negative.   HENT: Negative.   Eyes: Negative.   Respiratory: Negative.   Cardiovascular: Negative.   Gastrointestinal: Negative.  Negative for constipation.  Genitourinary: Negative.   Musculoskeletal: Negative.  Negative for myalgias, back  pain, joint pain, falls and neck pain.  Skin: Negative.   Neurological: Negative.   Endo/Heme/Allergies: Negative.   Psychiatric/Behavioral: Negative.     Blood pressure 111/78, pulse 83, temperature 98.3 F (36.8 C), temperature source Oral, resp. rate 18, height 5\' 4"  (1.626 m), weight 48.535 kg (107 lb), SpO2 98 %.Body mass index is 18.36 kg/(m^2).  General Appearance: Disheveled  Eye Sport and exercise psychologist::  Fair  Speech:  Clear and Coherent and Normal Rate  Volume:  Normal  Mood:  Anxious and Depressed  Affect:  Blunt and odd  Thought Process:  Disorganized  Orientation:  Full (Time, Place, and Person)  Thought Content:  Hallucinations: Auditory  Suicidal Thoughts:  No  Homicidal Thoughts:  No  Memory:  Immediate;   Fair Recent;   Fair Remote;   Fair  Judgement:  Poor  Insight:  Lacking and Shallow  Psychomotor Activity:  Decreased  Concentration:  Poor  Recall:  Poor  Fund of Knowledge:Poor  Language: Poor  Akathisia:  No  Handed:  Right  AIMS (if indicated):     Assets:  Communication Skills Desire for Improvement Financial Resources/Insurance Housing Physical Health Resilience Social Support  ADL's:  Intact  Cognition: WNL  Sleep:  Number of Hours: 6.5   Treatment Plan Summary: Daily contact with patient to assess and evaluate symptoms and progress in treatment and Medication management   Sharon Woods is a 55 year old female with no past psychiatric history admitted for catatonia and altered mental status  Psychosis, depression, catatonia : Thought blocking is not longer present. However patient continues to be anxious, appears depressed and reports feeling very fearful. She stills becomes frustrated quickly.   Continue olanzapine zydis but I will increase the dose from a total of 20 mg a day to 30 mg at bedtime.   Continue Remeron 30 mg orally disintegrating tablet daily at bedtime  As patient refuses medications for 3 days.  She was started on non emergency forced  medications on 04/03/15. --- She has been compliant since January 3  Insomnia: continue Ativan  0.5 mg daily at bedtime.   Poor oral intake: much improved.  Continue ensure tid.  Continue vitamins (liq) q day.  Excellent oral intake. 90-100% of all her meals over the weekend  UTI. She was given Rocephin on medical floor. We were able to recheck UA on January 6. UA was clear  Constipation: Continues to complain of severe constipation not responded to Dulcolax, or MiraLAX. I will order a second dose of Mgcitrate. Patient will also be started on Colace and Senokot  Back pain: continue ibuprofen prn  Disposition. She will be discharged to home with her family. She will follow up with her local provider.   Labs:RPR, HIV--non reactive vitamin B12 wnl. Ammonia was within the normal limits. CBC, basic metabolic panel and UA completed on January 5 and are within the normal limits  Spoke with her son ,[name], [phone]. He reports that she did have a history of "mood swings, she was given treatment after one of her pregnancies. He reports that she has been doing well up until the summer when Tony's son was placed in foster care and then the patient's sister passed away. The patient and son became homeless for 2 months and had to leave at the homeless shelter. They are now living with the patient's mother.   Hildred Priest

## 2015-04-09 NOTE — Progress Notes (Signed)
Patient denies SI. Patient affect is flat. Suspicious. Patient c/o of constipation. PRN medications given previous shift, with no results. Patient encouraged to drink fluids. Patient med compliant this shift. Patient states that she thinks too much but would not elaborate. Patient states that she is ready to go home. No unsafe behaviors noted. Will continue to monitor. Patient slept 6.5 hours.

## 2015-04-09 NOTE — [facility] Group Notes (Signed)
[place] Group Notes:  (Nursing/MHT/Case Management/Adjunct)  Date:  04/09/2015  Time:  12:38 PM  Type of Therapy:  Psychoeducational Skills  Participation Level:  Did Not Attend  Celso Amy 04/09/2015, 12:38 PM

## 2015-04-09 NOTE — Progress Notes (Signed)
Recreation Therapy Notes  Date: 01.09.17 Time: 3:00 pm Location: Craft Room  Group Topic: Self-expression  Goal Area(s) Addresses:  Patient will identify one color per emotion listed on wheel. Patient will verbalize benefit of using art as a means of self-expression. Patient will verbalize one emotion experienced during session. Patient will be educated on other forms of self-expression.  Behavioral Response: Did not attend  Intervention: Emotion Wheel  Activity: Patients were given a worksheet with 7 emotions on it and instructed to pick a color for each emotion.  Education: LRT educated patients on different forms of self-expression.  Education Outcome: Patient did not attend group.  Clinical Observations/Feedback: Patient did not attend group.  Leonette Monarch, LRT/CTRS 04/09/2015 4:27 PM

## 2015-04-09 NOTE — [facility] Group Notes (Signed)
[place] Group Notes:  (Nursing/MHT/Case Management/Adjunct)  Date:  04/09/2015  Time:  3:43 AM  Type of Therapy:  Group Therapy  Participation Level:  Active  Participation Quality:  Appropriate  Affect:  Appropriate  Cognitive:  Appropriate  Insight:  Improving  Engagement in Group:  Developing/Improving  Modes of Intervention:  n/a  Summary of Progress/Problems:  Sharon Woods 04/09/2015, 3:43 AM

## 2015-04-09 NOTE — Plan of Care (Signed)
Problem: Alteration in mood Goal: LTG-Patient reports reduction in suicidal thoughts (Patient reports reduction in suicidal thoughts and is able to verbalize a safety plan for whenever patient is feeling suicidal)  Outcome: Progressing Denies SI although continues to exhibit suspicious behaviors.

## 2015-04-09 NOTE — Plan of Care (Signed)
Problem: Alteration in thought process Goal: LTG-Patient verbalizes understanding importance med regimen (Patient verbalizes understanding of importance of medication regimen and need to continue outpatient care.)  Outcome: Progressing Medication compliant although continues to ask how long she has to take medications.

## 2015-04-10 MED ORDER — MAGNESIUM CITRATE PO SOLN
1.0000 | Freq: Once | ORAL | Status: DC
Start: 2015-04-10 — End: 2015-04-10
  Filled 2015-04-10: qty 296

## 2015-04-10 MED ORDER — MAGNESIUM CITRATE PO SOLN: 0.5000 | Freq: Once | ORAL | Status: DC

## 2015-04-10 NOTE — Progress Notes (Signed)
Brief Nutrition Follow-up:  Pt appetite improved, eating 85-100% of meals on Regular diet per I/O, Pt also continues to receive Ensure and drinking per documentation. Will sign off. Please re-consult RD if further assistance is needed.   Kerman Passey [place], [place], LDN [phone] Pager  [phone] Weekend/On-Call Pager

## 2015-04-10 NOTE — [facility] Group Notes (Signed)
[place] Group Notes:  (Nursing/MHT/Case Management/Adjunct)  Date:  04/10/2015  Time:  5:00 PM  Type of Therapy:  Psychoeducational Skills  Participation Level:  Active  Participation Quality:  Appropriate, Attentive and Sharing  Affect:  Appropriate  Cognitive:  Appropriate  Insight:  Improving  Engagement in Group:  Engaged  Modes of Intervention:  Discussion, Education and Support  Summary of Progress/Problems:  Adela Lank Evan [place] 04/10/2015, 5:00 PM

## 2015-04-10 NOTE — Progress Notes (Addendum)
D: Pt is awake and active in the milieu this evening. Pt mood is anxious and her affect is anxious/depressed. Pt denies SI/HI and AVH at this time. Pt is spending much of her time in her room but does come out for snack time. Pt is ambivalent about taking medications, but was compliant with her regimen.   A: Writer provided emotional support and administered medications as prescribed. Writer provided education about ODT Zyprexa, depression and psychosis.  R: Pt is quickly overwhelmed by new information and has some difficulty comprehending medication education, although she did take the medications as prescribed with encouragement. Pt needs light on in her room and the door open when she sleeps. Still no results from mag citrate, and pt c/o abdominal pain r/t constipation. Pt refused pain medication.

## 2015-04-10 NOTE — Tx Team (Addendum)
Interdisciplinary Treatment Plan Update (Adult)  Date:  04/10/2015 Time Reviewed:  6:46 PM  Progress in Treatment: Attending groups: Yes. Participating in groups:  Intermittently \Taking medication as prescribed:  Intermittently Tolerating medication:  Yes. Family/Significant othe contact made:  Yes, individual(s) contacted:  patient's son Elberta Fortis "Vivien Rota" [place] [phone] Patient understands diagnosis:  No. Discussing patient identified problems/goals with staff:  Yes. Medical problems stabilized or resolved:  Yes. Denies suicidal/homicidal ideation: Yes. Issues/concerns per patient self-inventory:  No. Other:  New problem(s) identified: No, Describe:  none reported  Discharge Plan or Barriers: Stabilize on medications and discharge home with family and refer to [facility]  Reason for Continuation of Hospitalization: Delusions  Medication stabilization Other; describe paranoia  Comments:patient agreed to bloodwork and will be referred to [facility]  Estimated length of stay:up to 7-10 days expected discharge Thursday 04/16/15  New goal(s):  Review of initial/current patient goals per problem list:   1.  Goal(s):particiapte in care planning  Met:  No   Target date:by discharge  1/10: Goal progressing  2.  Goal (s):psychosis will be manageable  Met:  No         Target date:by discharge  1/10: Goal progressing   Attendees: Physician:  Merlyn Albert, MD 1/10/20176:46 PM  Nursing:   Marylou Flesher, RN 1/10/20176:46 PM  Other:  Carmell Austria, [place] 1/10/20176:46 PM  Other:   1/10/20176:46 PM  Other:   1/10/20176:46 PM  Other:  1/10/20176:46 PM  Other:  1/10/20176:46 PM  Other:  1/10/20176:46 PM  Other:  1/10/20176:46 PM  Other:  1/10/20176:46 PM  Other:  1/10/20176:46 PM  Other:   1/10/20176:46 PM   Scribe for Treatment Team:   Claudine Mouton, MSW, LCSWA [phone]  04/10/2015, 6:46 PM

## 2015-04-10 NOTE — Progress Notes (Signed)
Patient with sad affect, alert and oriented to name. Confused as to recommended plan of care and needs support and encouragement of staff. No SI/HI at this time. Minimal interaction with peers. Guarded when staff initiates interaction. Thought blocking noted. Safety maintained.

## 2015-04-10 NOTE — Progress Notes (Signed)
Observed resting in bed, startled, A&Ox2, denied pain, denied SI/HI, denied AV/H. Delayed cognitive response, hesitant, reluctant to answer; flat mood and affect; requested for another bottle of Gatorade. Will continue with POC.

## 2015-04-10 NOTE — Progress Notes (Addendum)
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 Started on non emergency forced medications on 04/03/15. The patient is now eating better. Thought blocking has decreased significantly.   Mood is described as very anxious and nervous but doesn't know why. Feels she is going to be locked in her room if the door closes.  Says she is unable to answer the paper questionnaires given by the nurses, says she doesn't understand the questions. She continues to state that is hard for her to follow what the nurses are asking and what is being this caused during group therapy.  Denies SI, HI or auditory or visual hallucinations. Oral intake has been excellent patient has been eating between 90-100% of her meals.  No side effects from medication.  As far as physical complaints she receives admissions titrate yesterday after complaining of severe constipation. This morning she reports she had a bowel movement.  Patient son came to visit her yesterday--- he feels she is not at baseline  Today during assessment thought blocking and appears less severe. However she is perseverating on not having", not having a stable place to live in, and not having transportation when discharge. She asked to be discharged and then when I explained to her that she is improving and hopefully within a week she can be discharged she then says is too soon patient just repeats her questions over and over.   Per nursing:  D: Pt is awake and active in the milieu this evening. Pt mood is anxious and her affect is anxious/depressed. Pt denies SI/HI and AVH at this time. Pt is spending much of her time in her room but does come out for snack time. Pt is ambivalent about taking medications, but was compliant with her regimen.   A: Writer provided emotional support and administered medications as prescribed.  Writer provided education about ODT Zyprexa, depression and psychosis.  R: Pt is quickly overwhelmed by new information and has some difficulty comprehending medication education, although she did take the medications as prescribed with encouragement. Pt needs light on in her room and the door open when she sleeps. Still no results from mag citrate, and pt c/o abdominal pain r/t constipation. Pt refused pain medication.         Principal Problem: Severe major depression, single episode, with psychotic features, mood-congruent ([place]) Diagnosis:   Patient Active Problem List   Diagnosis Date Noted  . Severe major depression, single episode, with psychotic features, mood-congruent ([place]) [F32.3] 03/29/2015  . Protein-calorie malnutrition, severe [E43] 03/26/2015  . 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  Past Medical History:  Past Medical History  Diagnosis Date  . Patient denies medical problems     Past Surgical History  Procedure Laterality Date  . No past surgeries     Family History:  Family History  Problem Relation Age of Onset  . Cancer    . Dementia     Family Psychiatric  History: See H&P.  Social History:  History  Alcohol Use No     History  Drug Use No    Social History   Social History  . Marital Status: Married    Spouse Name: N/A  . Number of Children: N/A  . Years of Education: N/A   Social History Main Topics  . Smoking  status: Never Smoker   . Smokeless tobacco: None  . Alcohol Use: No  . Drug Use: No  . Sexual Activity: Not Asked     Comment: unable to assess    Other Topics Concern  . None   Social History Narrative   Additional Social History:    Pain Medications:  (na) Prescriptions:  (na) Over the Counter:  (na) History of alcohol / drug use?: No history of alcohol / drug abuse Negative Consequences of Use: Personal relationships Withdrawal Symptoms:  Other (Comment) (na)     Sleep: Good  Appetite:  Good  Current Medications: Current Facility-Administered Medications  Medication Dose Route Frequency Provider Last Rate Last Dose  . acetaminophen (TYLENOL) tablet 650 mg  650 mg Oral Q6H PRN Gonzella Lex, MD   650 mg at 04/04/15 2144  . alum & mag hydroxide-simeth (MAALOX/MYLANTA) 200-200-20 MG/5ML suspension 30 mL  30 mL Oral Q4H PRN Gonzella Lex, MD      . docusate sodium (COLACE) capsule 200 mg  200 mg Oral BID Hildred Priest, MD   200 mg at 04/10/15 0919  . feeding supplement (ENSURE ENLIVE) (ENSURE ENLIVE) liquid 237 mL  237 mL Oral TID BM Jolanta B Pucilowska, MD   237 mL at 04/09/15 2000  . geriatric multivitamins-minerals (ELDERTONIC/GEVRABON) elixir ELIX 15 mL  15 mL Oral Daily Hildred Priest, MD   15 mL at 04/10/15 0919  . ibuprofen (ADVIL,MOTRIN) tablet 600 mg  600 mg Oral Q6H PRN Hildred Priest, MD      . LORazepam (ATIVAN) tablet 0.5 mg  0.5 mg Oral QHS Hildred Priest, MD   0.5 mg at 04/09/15 2119  . magnesium citrate solution 1 Bottle  1 Bottle Oral Once Hildred Priest, MD      . magnesium hydroxide (MILK OF MAGNESIA) suspension 30 mL  30 mL Oral Daily PRN Gonzella Lex, MD      . mirtazapine (REMERON SOL-TAB) disintegrating tablet 30 mg  30 mg Oral QHS Gonzella Lex, MD   30 mg at 04/09/15 2200  . OLANZapine zydis (ZYPREXA) disintegrating tablet 30 mg  30 mg Oral QHS Hildred Priest, MD      . senna-docusate (Senokot-S) tablet 2 tablet  2 tablet Oral QHS Hildred Priest, MD        Lab Results:  No results found for this or any previous visit (from the past 48 hour(s)).  Physical Findings: AIMS: Facial and Oral Movements Muscles of Facial Expression: None, normal Lips and Perioral Area: None, normal Jaw: None, normal Tongue: None, normal,Extremity Movements Upper (arms, wrists, hands, fingers): None, normal Lower (legs, knees, ankles,  toes): None, normal, Trunk Movements Neck, shoulders, hips: None, normal, Overall Severity Severity of abnormal movements (highest score from questions above): None, normal Incapacitation due to abnormal movements: None, normal Patient's awareness of abnormal movements (rate only patient's report): Aware, no distress, Dental Status Current problems with teeth and/or dentures?: No Does patient usually wear dentures?: No  CIWA:  CIWA-Ar Total: 2 COWS:  COWS Total Score: 1  Musculoskeletal: Strength & Muscle Tone: within normal limits Gait & Station: normal Patient leans: N/A  Psychiatric Specialty Exam: Review of Systems  Constitutional: Negative.   HENT: Negative.   Eyes: Negative.   Respiratory: Negative.   Cardiovascular: Negative.   Gastrointestinal: Negative.   Genitourinary: Negative.   Musculoskeletal: Negative.  Negative for myalgias, back pain, joint pain, falls and neck pain.  Skin: Negative.   Neurological: Negative.   Endo/Heme/Allergies: Negative.  Psychiatric/Behavioral: Negative.     Blood pressure 111/78, pulse 83, temperature 98.3 F (36.8 C), temperature source Oral, resp. rate 18, height 5\' 4"  (1.626 m), weight 48.535 kg (107 lb), SpO2 98 %.Body mass index is 18.36 kg/(m^2).  General Appearance: Disheveled  Eye Sport and exercise psychologist::  Fair  Speech:  Clear and Coherent and Normal Rate  Volume:  Normal  Mood:  Anxious and Depressed  Affect:  Blunt  Thought Process:  concrete, perseverates  Orientation:  Full (Time, Place, and Person)  Thought Content:  Rumination  Suicidal Thoughts:  No  Homicidal Thoughts:  No  Memory:  Immediate;   Fair Recent;   Fair Remote;   Fair  Judgement:  Poor  Insight:  Lacking  Psychomotor Activity:  Decreased  Concentration:  Poor  Recall:  Poor  Fund of Knowledge:Poor  Language: Poor  Akathisia:  No  Handed:  Right  AIMS (if indicated):     Assets:  Communication Skills Desire for Improvement Financial  Resources/Insurance Housing Physical Health Resilience Social Support  ADL's:  Intact  Cognition: WNL  Sleep:  Number of Hours: 7.25   Treatment Plan Summary: Daily contact with patient to assess and evaluate symptoms and progress in treatment and Medication management   Sharon Woods is a 55 year old female with no past psychiatric history admitted for catatonia and altered mental status  Psychosis, depression, catatonia : Thought blocking is not longer present. However patient continues to be anxious, appears depressed and reports feeling very fearful. She stills becomes frustrated quickly.  Patient was perseverating on not having a stable place to go, not having transportation to go home and not having". Even after I answer all these questions and explained to her that she will be discharged, once stable, back to her mother's house and that we can arrange for transportation at that time, she keeps repeating the same concern. Patient also has been focused on not having any close however she has 3 sets of clotting brought on by family. Patient says that she cannot wear them because they are not that close she wants.  Continue olanzapine zydis dose was increased from 20 mg to 30 mg daily at bedtime on January 9.   Not at baseline yet per family.  Continue Remeron 30 mg orally disintegrating tablet daily at bedtime  As patient refuses medications for 3 days.  She was started on non emergency forced medications on 04/03/15. --- She has been compliant since January 3  Insomnia: continue Ativan  0.5 mg daily at bedtime.   Poor oral intake: much improved.  Continue ensure tid.  Continue vitamins (liq) q day.  Excellent oral intake. 90-100% of all her meals over the weekend  UTI. She was given Rocephin on medical floor. We were able to recheck UA on January 6. UA was clear  Constipation: Pt received Mgcitrate yesterday with good response.. Patient will also be started on Colace and Senokot  Back  pain: continue ibuprofen prn  Disposition. She will be discharged to home with her family. She will follow up with her local provider. SW will try to refer her for ACT.  Labs:RPR, HIV--non reactive vitamin B12 wnl. Ammonia was within the normal limits. CBC, basic metabolic panel and UA completed on January 5 and are within the normal limits  Spoke with her son ,[name], [phone]. He reports that she did have a history of "mood swings, she was given treatment after one of her pregnancies. He reports that she has been doing well up until the  summer when Tony's son was placed in foster care and then the patient's sister passed away. The patient and son became homeless for 2 months and had to leave at the homeless shelter. They are now living with the patient's mother.  I certify that the services received since the previous certification/recertification were and continue to be medically necessary as the treatment provided can be reasonably expected to improve the patient's condition; the medical record documents that the services furnished were intensive treatment services or their equivalent services, and this patient continues to need, on a daily basis, active treatment furnished directly by or requiring the supervision of inpatient psychiatric personnel.    Hildred Priest

## 2015-04-10 NOTE — Progress Notes (Signed)
Recreation Therapy Notes  Date: 01.10.17 Time: 3:00 pm Location: Craft Room  Group Topic: Goal Setting  Goal Area(s) Addresses:  Patient will write down one goal. Patient will write at least one encouraging statement.  Behavioral Response: Did not attend  Intervention: Step By Step  Activity: Patients were given a foot and instructed to write a goal towards their recovery on the inside of the foot and write positive statements on the outside.  Education: LRT educated patients on ways they can stay focused on their goals.  Education Outcome: Patient did not attend group.   Clinical Observations/Feedback: Patient did not attend group.  Leonette Monarch, LRT/CTRS 04/10/2015 4:14 PM

## 2015-04-10 NOTE — Plan of Care (Signed)
Problem: Consults Goal: Psychosis Patient Education See Patient Education Module for education specifics.  Outcome: Not Progressing Pt has difficulty comprehending diagnosis/medication education and is somewhat resistant to new information.

## 2015-04-11 NOTE — [facility] Group Notes (Signed)
[facility] LCSW Aftercare Discharge Planning Group Note   04/11/2015 4:40 PM  Participation Quality: Did not attend   [place] MSW, [place]

## 2015-04-11 NOTE — BHH Group Notes (Signed)
[place] Group Notes:  (Nursing/MHT/Case Management/Adjunct)  Date:  04/11/2015  Time:  11:47 PM  Type of Therapy:  Evening Wrap-up Group  Participation Level:  Minimal  Participation Quality:  Appropriate  Affect:  Appropriate  Cognitive:  Appropriate  Insight:  Appropriate  Engagement in Group:  Improving  Modes of Intervention:  Discussion  Summary of Progress/Problems: Pt. Discussed meeting her goal which was to be "out of her room more."   [facility] 04/11/2015, 11:47 PM

## 2015-04-11 NOTE — Progress Notes (Signed)
Recreation Therapy Notes  Date: 01.11.17 Time: 3:00 pm Location: Craft Room  Group Topic: Self-esteem  Goal Area(s) Addresses:  Patient will write at least one positive trait. Patient will verbalize benefit of having a healthy self-esteem.  Behavioral Response: Attentive  Intervention: I Am  Activity: Patients were given a worksheet with the letter I and instructed to write as many positive traits inside the letter as they could.  Education: LRT educated patients on ways they can increase their self-esteem.  Education Outcome: In group clarification offered  Clinical Observations/Feedback: Patient wrote positive traits. Patient did not contribute to group discussion.  Leonette Monarch, LRT/CTRS 04/11/2015 4:47 PM

## 2015-04-11 NOTE — Plan of Care (Signed)
Problem: Ineffective individual coping Goal: STG: Patient will remain free from self harm Outcome: Progressing Q15 minute checks maintained for safety, clinical and moral support provided, became active when a new patient admitted, she demanded to have a cold tray too; interacted well. Patient remain free from harm, will continue to monitor.

## 2015-04-11 NOTE — Plan of Care (Signed)
Problem: Consults Goal: Depression Patient Education See Patient Education Module for education specifics.  Outcome: Not Progressing Patient remains depressed and guarded. Not active participant in plan of care. Patient with low energy, poor comprehension. Seems to enjoy interaction with a roommate.

## 2015-04-11 NOTE — B[facility] Group Notes (Signed)
[place] LCSW Group Therapy  04/11/2015 5:47 PM  Type of Therapy:  Group Therapy  Participation Level:  Minimal  Participation Quality:  Attentive  Affect:  Flat  Cognitive:  Alert  Insight:  Limited  Engagement in Therapy:  Limited  Modes of Intervention:  Discussion, Education, Socialization and Support  Summary of Progress/Problems:Emotional Regulation: Patients will identify both negative and positive emotions. They will discuss emotions they have difficulty regulating and how they impact their lives. Patients will be asked to identify healthy coping skills to combat unhealthy reactions to negative emotions.   Pt attended group and stayed the entire time. She sat quietly and listened to other group members share.   [place] MSW, LCSWA  04/11/2015, 5:47 PM

## 2015-04-11 NOTE — Progress Notes (Signed)
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 Started on non emergency forced medications on 04/03/15. The patient is now eating.. Thought blocking no longer present.  Continues to have depressed mood, anxiety, psychomotor agitation, poor attention and poor concentration. Thought process is now positive for perseveration.   Mood is described as very anxious and nervous but doesn't know why. Feels she is going to be locked in her room if the door closes.  Attention and concentration are still poor. She complains that she is unable to follow what is being discussed in group. She is not longer having thought blocking. However she is perseverating on not having a way to get home, she kids being fixated on this evening when I have assured her that we can arrange transportation for her when discharge.    Denies SI, HI or auditory or visual hallucinations, she told me that earlier in the week she was hearing voices.   Oral intake has been excellent patient has been eating between 90-100% of her meals.  No side effects from medication.  As far as physical complaints she has been c/o on and off of constipation.  She had a BM yesterday after Mg citrate.  Patient son came to visit her on 1/10--- he feels she is not at baseline  Today during assessment thought blocking and appears less severe. However she is perseverating on not having", not having a stable place to live in, and not having transportation when discharge. She asked to be discharged and then when I explained to her that she is improving and hopefully within a week she can be discharged she then says is too soon patient just repeats her questions over and over.   Per nursing:  Patient remains depressed and guarded. Not active participant in plan of care. Patient with low energy, poor comprehension. Seems to  enjoy interaction with a roommate.   Principal Problem: Severe major depression, single episode, with psychotic features, mood-congruent ([place]) Diagnosis:   Patient Active Problem List   Diagnosis Date Noted  . Severe major depression, single episode, with psychotic features, mood-congruent ([place]) [F32.3] 03/29/2015  . Protein-calorie malnutrition, severe [E43] 03/26/2015  . 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  Past Medical History:  Past Medical History  Diagnosis Date  . Patient denies medical problems     Past Surgical History  Procedure Laterality Date  . No past surgeries     Family History:  Family History  Problem Relation Age of Onset  . Cancer    . Dementia     Family Psychiatric  History: See H&P.  Social History:  History  Alcohol Use No     History  Drug Use No    Social History   Social History  . Marital Status: Married    Spouse Name: N/A  . Number of Children: N/A  . Years of Education: N/A   Social History Main Topics  . Smoking status: Never Smoker   . Smokeless tobacco: None  . Alcohol Use: No  . Drug Use: No  . Sexual Activity: Not Asked     Comment: unable to assess    Other Topics Concern  . None   Social History Narrative   Additional Social History:    Pain Medications:  (na) Prescriptions:  (na) Over the  Counter:  (na) History of alcohol / drug use?: No history of alcohol / drug abuse Negative Consequences of Use: Personal relationships Withdrawal Symptoms: Other (Comment) (na)     Sleep: Fair  Appetite:  Good  Current Medications: Current Facility-Administered Medications  Medication Dose Route Frequency Provider Last Rate Last Dose  . acetaminophen (TYLENOL) tablet 650 mg  650 mg Oral Q6H PRN Gonzella Lex, MD   650 mg at 04/04/15 2144  . alum & mag hydroxide-simeth (MAALOX/MYLANTA) 200-200-20 MG/5ML suspension 30 mL  30 mL Oral Q4H PRN Gonzella Lex, MD      . docusate sodium (COLACE) capsule 200 mg  200 mg Oral BID Hildred Priest, MD   200 mg at 04/11/15 0939  . feeding supplement (ENSURE ENLIVE) (ENSURE ENLIVE) liquid 237 mL  237 mL Oral TID BM Jolanta B Pucilowska, MD   237 mL at 04/11/15 1032  . geriatric multivitamins-minerals (ELDERTONIC/GEVRABON) elixir ELIX 15 mL  15 mL Oral Daily Hildred Priest, MD   15 mL at 04/11/15 0939  . ibuprofen (ADVIL,MOTRIN) tablet 600 mg  600 mg Oral Q6H PRN Hildred Priest, MD      . LORazepam (ATIVAN) tablet 0.5 mg  0.5 mg Oral QHS Hildred Priest, MD   0.5 mg at 04/10/15 2129  . [START ON 04/12/2015] magnesium citrate solution 0.5 Bottle  0.5 Bottle Oral Once Hildred Priest, MD      . magnesium hydroxide (MILK OF MAGNESIA) suspension 30 mL  30 mL Oral Daily PRN Gonzella Lex, MD      . mirtazapine (REMERON SOL-TAB) disintegrating tablet 30 mg  30 mg Oral QHS Gonzella Lex, MD   30 mg at 04/10/15 2129  . OLANZapine zydis (ZYPREXA) disintegrating tablet 30 mg  30 mg Oral QHS Hildred Priest, MD   30 mg at 04/10/15 2128  . senna-docusate (Senokot-S) tablet 2 tablet  2 tablet Oral QHS Hildred Priest, MD   2 tablet at 04/11/15 0451    Lab Results:  No results found for this or any previous visit (from the past 48 hour(s)).  Physical Findings: AIMS: Facial and Oral Movements Muscles of Facial Expression: None, normal Lips and Perioral Area: None, normal Jaw: None, normal Tongue: None, normal,Extremity Movements Upper (arms, wrists, hands, fingers): None, normal Lower (legs, knees, ankles, toes): None, normal, Trunk Movements Neck, shoulders, hips: None, normal, Overall Severity Severity of abnormal movements (highest score from questions above): None, normal Incapacitation due to abnormal movements: None, normal Patient's awareness of abnormal movements (rate only patient's report): Aware, no distress, Dental  Status Current problems with teeth and/or dentures?: No Does patient usually wear dentures?: No  CIWA:  CIWA-Ar Total: 2 COWS:  COWS Total Score: 1  Musculoskeletal: Strength & Muscle Tone: within normal limits Gait & Station: normal Patient leans: N/A  Psychiatric Specialty Exam: Review of Systems  Constitutional: Negative.   HENT: Negative.   Eyes: Negative.   Respiratory: Negative.   Cardiovascular: Negative.   Gastrointestinal: Negative.   Genitourinary: Negative.   Musculoskeletal: Negative.  Negative for myalgias, back pain, joint pain, falls and neck pain.  Skin: Negative.   Neurological: Negative.   Endo/Heme/Allergies: Negative.   Psychiatric/Behavioral: Negative.     Blood pressure 114/79, pulse 83, temperature 98.3 F (36.8 C), temperature source Oral, resp. rate 20, height 5\' 4"  (1.626 m), weight 48.535 kg (107 lb), SpO2 98 %.Body mass index is 18.36 kg/(m^2).  General Appearance: Disheveled  Eye Contact::  Fair  Speech:  Clear and Coherent  and Normal Rate  Volume:  Normal  Mood:  Anxious and Depressed  Affect:  Blunt  Thought Process:  concrete, perseverates  Orientation:  Full (Time, Place, and Person)  Thought Content:  Rumination  Suicidal Thoughts:  No  Homicidal Thoughts:  No  Memory:  Immediate;   Fair Recent;   Fair Remote;   Fair  Judgement:  Poor  Insight:  Lacking  Psychomotor Activity:  Decreased  Concentration:  Poor  Recall:  Poor  Fund of Knowledge:Poor  Language: Poor  Akathisia:  No  Handed:  Right  AIMS (if indicated):     Assets:  Communication Skills Desire for Improvement Financial Resources/Insurance Housing Physical Health Resilience Social Support  ADL's:  Intact  Cognition: WNL  Sleep:  Number of Hours: 5.45   Treatment Plan Summary: Daily contact with patient to assess and evaluate symptoms and progress in treatment and Medication management   Ms. Yong Channel is a 55 year old female with no past psychiatric history  admitted for catatonia and altered mental status  Psychosis, depression, catatonia : Thought blocking is not longer present. However patient continues to be anxious, appears depressed and reports feeling very fearful. She stills becomes frustrated quickly.  Patient was perseverating on not having a stable place to go, not having transportation to go home and not having". Even after I answer all these questions and explained to her that she will be discharged, once stable, back to her mother's house and that we can arrange for transportation at that time, she keeps repeating the same concern. .  Continue olanzapine zydis dose was increased from 20 mg to 30 mg daily at bedtime on January 9. No change today. Tolerating Zyprexa well  Not at baseline yet per family.  Continue Remeron 30 mg orally disintegrating tablet daily at bedtime  As patient refuses medications for 3 days.  She was started on non emergency forced medications on 04/03/15. --- She has been compliant since January 3  Insomnia: continue Ativan  0.5 mg daily at bedtime.   Poor oral intake: much improved.  Continue ensure tid.  Continue vitamins (liq) q day.  Excellent oral intake. 90-100%.   UTI. She was given Rocephin on medical floor. We were able to recheck UA on January 6. UA was clear  Constipation: Pt will received Mgcitrate again tomorrow.  Continue Colace and Senokot  Back pain: continue ibuprofen prn  Disposition. She will be discharged to home with her family. She will follow up with her local provider. SW will try to refer her for ACT.  Labs:RPR, HIV--non reactive vitamin B12 wnl. Ammonia was within the normal limits. CBC, basic metabolic panel and UA completed on January 5 and are within the normal limits  Spoke with her son ,[name], [phone]. He reports that she did have a history of "mood swings, she was given treatment after one of her pregnancies. He reports that she has been doing well up until the summer when  Tony's son was placed in foster care and then the patient's sister passed away. The patient and son became homeless for 2 months and had to leave at the homeless shelter. They are now living with the patient's mother.    Hildred Priest

## 2015-04-12 NOTE — Plan of Care (Signed)
Problem: Alteration in mood Goal: LTG-Patient reports reduction in suicidal thoughts (Patient reports reduction in suicidal thoughts and is able to verbalize a safety plan for whenever patient is feeling suicidal)  Outcome: Progressing Patient denies SI/HI.      

## 2015-04-12 NOTE — Progress Notes (Signed)
D: Pt denies SI/HI/AVH. Pt is pleasant and cooperative. Patient, appears less anxious and she is interacting with peers and staff appropriately.  A: Pt was offered support and encouragement. Pt was given scheduled medications. Pt was encouraged to attend groups. Q 15 minute checks were done for safety.  R:Pt attends groups and interacts well with peers and staff. Pt is taking medication. Pt has no complaints.Pt receptive to treatment and safety maintained on unit.

## 2015-04-12 NOTE — BHH Group Notes (Signed)
[place] LCSW Group Therapy  04/12/2015 8:55 PM  Type of Therapy:  Group Therapy  Participation Level:  Minimal  Participation Quality:  Attentive  Affect:  Tearful  Cognitive:  Alert  Insight:  Limited  Engagement in Therapy:  Limited  Modes of Intervention:  Discussion, Education, Socialization and Support  Summary of Progress/Problems: Balance in life: Patients will discuss the concept of balance and how it looks and feels to be unbalanced. Pt will identify areas in their life that is unbalanced and ways to become more balanced.  Sharon Woods attended group and stayed the entire time. She sat quietly and listened to other group members share. She states she is not doing well today because she wants to go home.   [place] MSW, LCSWA  04/12/2015, 8:55 PM

## 2015-04-12 NOTE — Progress Notes (Signed)
Recreation Therapy Notes  Date: 01.12.17 Time: 3:00 pm Location: Craft Room  Group Topic: Leisure Education/ Coping Skills  Goal Area(s) Addresses:  Patient will identify things they are grateful for. Patient will identify how being grateful can influence decision making.  Behavioral Response: Attentive  Intervention: Immunologist  Activity: Patients were given an "I Am Grateful For" worksheet and instructed to write at least one thing they are grateful for under each category.  Education:LRT educated patients on leisure and why it is important.  Education Outcome: In group clarification offered.  Clinical Observations/Feedback: Patient wrote at least one thing she was grateful for under each category. Patient did not contribute to group discussion.  Leonette Monarch, LRT/CTRS 04/12/2015 4:26 PM

## 2015-04-12 NOTE — Progress Notes (Signed)
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 Started on non emergency forced medications on 04/03/15. The patient is now eating.. Thought blocking no longer present.  Continues to have depressed mood, anxiety, psychomotor agitation, poor attention and poor concentration. Thought process is now positive for perseveration.   Mood is described as very anxious and nervous but doesn't know why. Feels she is going to be locked in her room if the door closes.  Attention and concentration are still poor. She complains that she is unable to follow what is being discussed in group. She is not longer having thought blocking. However she is perseverating on not having a way to get home, she keeps being fixated on this.  Patient continues to ask me the same question as to how she is going to get home when I told her we can get her a taxi to take her to her home.  With that after she becomes nervous and restless. She says she has no money to go home. When I told her that we can pay for the Most of the times. She keeps saying "it is too expensive, it is too expensive.  I don't have any money". .   Denies SI, HI or auditory or visual hallucinations, she told me that earlier in the week she was hearing voices.   Oral intake has been excellent patient has been eating between 90-100% of her meals consistently for longer than one week.  No side effects from medication.  As far as physical complaints she has been c/o on and off of constipation.  She will receive another dose of magnesium citrate today.  Patient son came to visit her on 1/10--- he feels she is not at baseline   Per nursing:  D: Pt denies SI/HI/AVH. Pt is pleasant and cooperative. Patient, appears less anxious and she is interacting with peers and staff appropriately.  A: Pt was offered support and encouragement. Pt  was given scheduled medications. Pt was encouraged to attend groups. Q 15 minute checks were done for safety.  R:Pt attends groups and interacts well with peers and staff. Pt is taking medication. Pt has no complaints.Pt receptive to treatment and safety maintained on unit.  Principal Problem: Severe major depression, single episode, with psychotic features, mood-congruent ([place]) Diagnosis:   Patient Active Problem List   Diagnosis Date Noted  . Severe major depression, single episode, with psychotic features, mood-congruent ([place]) [F32.3] 03/29/2015  . Protein-calorie malnutrition, severe [E43] 03/26/2015  . 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  Past Medical History:  Past Medical History  Diagnosis Date  . Patient denies medical problems     Past Surgical History  Procedure Laterality Date  . No past surgeries     Family History:  Family History  Problem Relation Age of Onset  . Cancer    . Dementia     Family Psychiatric  History: See H&P.  Social History:  History  Alcohol Use No     History  Drug Use No    Social History   Social History  . Marital Status: Married    Spouse Name: N/A  . Number of Children: N/A  . Years of Education: N/A   Social History Main Topics  . Smoking status: Never Smoker   . Smokeless tobacco: None  .  Alcohol Use: No  . Drug Use: No  . Sexual Activity: Not Asked     Comment: unable to assess    Other Topics Concern  . None   Social History Narrative   Additional Social History:    Pain Medications:  (na) Prescriptions:  (na) Over the Counter:  (na) History of alcohol / drug use?: No history of alcohol / drug abuse Negative Consequences of Use: Personal relationships Withdrawal Symptoms: Other (Comment) (na)     Sleep: Good  Appetite:  Good  Current Medications: Current Facility-Administered Medications  Medication Dose Route Frequency  Provider Last Rate Last Dose  . acetaminophen (TYLENOL) tablet 650 mg  650 mg Oral Q6H PRN Gonzella Lex, MD   650 mg at 04/04/15 2144  . alum & mag hydroxide-simeth (MAALOX/MYLANTA) 200-200-20 MG/5ML suspension 30 mL  30 mL Oral Q4H PRN Gonzella Lex, MD      . docusate sodium (COLACE) capsule 200 mg  200 mg Oral BID Hildred Priest, MD   200 mg at 04/12/15 [id number]  . feeding supplement (ENSURE ENLIVE) (ENSURE ENLIVE) liquid 237 mL  237 mL Oral TID BM Jolanta B Pucilowska, MD   237 mL at 04/11/15 2000  . geriatric multivitamins-minerals (ELDERTONIC/GEVRABON) elixir ELIX 15 mL  15 mL Oral Daily Hildred Priest, MD   15 mL at 04/11/15 0939  . ibuprofen (ADVIL,MOTRIN) tablet 600 mg  600 mg Oral Q6H PRN Hildred Priest, MD      . LORazepam (ATIVAN) tablet 0.5 mg  0.5 mg Oral QHS Hildred Priest, MD   0.5 mg at 04/11/15 2137  . magnesium citrate solution 0.5 Bottle  0.5 Bottle Oral Once Hildred Priest, MD   0.5 Bottle at 04/12/15 0000  . magnesium hydroxide (MILK OF MAGNESIA) suspension 30 mL  30 mL Oral Daily PRN Gonzella Lex, MD   30 mL at 04/12/15 0833  . mirtazapine (REMERON SOL-TAB) disintegrating tablet 30 mg  30 mg Oral QHS Gonzella Lex, MD   30 mg at 04/11/15 2137  . OLANZapine zydis (ZYPREXA) disintegrating tablet 30 mg  30 mg Oral QHS Hildred Priest, MD   30 mg at 04/11/15 2136  . senna-docusate (Senokot-S) tablet 2 tablet  2 tablet Oral QHS Hildred Priest, MD   2 tablet at 04/11/15 2137    Lab Results:  No results found for this or any previous visit (from the past 48 hour(s)).  Physical Findings: AIMS: Facial and Oral Movements Muscles of Facial Expression: None, normal Lips and Perioral Area: None, normal Jaw: None, normal Tongue: None, normal,Extremity Movements Upper (arms, wrists, hands, fingers): None, normal Lower (legs, knees, ankles, toes): None, normal, Trunk Movements Neck, shoulders, hips: None,  normal, Overall Severity Severity of abnormal movements (highest score from questions above): None, normal Incapacitation due to abnormal movements: None, normal Patient's awareness of abnormal movements (rate only patient's report): Aware, no distress, Dental Status Current problems with teeth and/or dentures?: No Does patient usually wear dentures?: No  CIWA:  CIWA-Ar Total: 2 COWS:  COWS Total Score: 1  Musculoskeletal: Strength & Muscle Tone: within normal limits Gait & Station: normal Patient leans: N/A  Psychiatric Specialty Exam: Review of Systems  Constitutional: Negative.   HENT: Negative.   Eyes: Negative.   Respiratory: Negative.   Cardiovascular: Negative.   Gastrointestinal: Negative.   Genitourinary: Negative.   Musculoskeletal: Negative.  Negative for myalgias, back pain, joint pain, falls and neck pain.  Skin: Negative.   Neurological: Negative.   Endo/Heme/Allergies: Negative.  Psychiatric/Behavioral: Negative.     Blood pressure 117/64, pulse 83, temperature 98.3 F (36.8 C), temperature source Oral, resp. rate 20, height 5\' 4"  (1.626 m), weight 48.535 kg (107 lb), SpO2 98 %.Body mass index is 18.36 kg/(m^2).  General Appearance: Disheveled  Eye Sport and exercise psychologist::  Fair  Speech:  Clear and Coherent and Normal Rate  Volume:  Normal  Mood:  Anxious and Depressed  Affect:  Blunt  Thought Process:  concrete, perseverates  Orientation:  Full (Time, Place, and Person)  Thought Content:  Rumination  Suicidal Thoughts:  No  Homicidal Thoughts:  No  Memory:  Immediate;   Fair Recent;   Fair Remote;   Fair  Judgement:  Poor  Insight:  Lacking  Psychomotor Activity:  Decreased  Concentration:  Poor  Recall:  Poor  Fund of Knowledge:Poor  Language: Poor  Akathisia:  No  Handed:  Right  AIMS (if indicated):     Assets:  Communication Skills Desire for Improvement Financial Resources/Insurance Housing Physical Health Resilience Social Support  ADL's:  Intact   Cognition: WNL  Sleep:  Number of Hours: 6.75   Treatment Plan Summary: Daily contact with patient to assess and evaluate symptoms and progress in treatment and Medication management   Sharon Woods is a 55 year old female with no past psychiatric history admitted for catatonia and altered mental status  Psychosis, depression, catatonia : Thought blocking is not longer present. However patient continues to be anxious, appears depressed and reports feeling very fearful. She stills becomes frustrated quickly.  Patient was perseverating on not having a stable place to go, not having transportation to go home.  Even after I answer all these questions and explained to her that she will be discharged, once stable, back to her mother's house and that we can arrange for transportation at that time, she keeps repeating the same concern. .  Continue olanzapine zydis dose was increased from 20 mg to 30 mg daily at bedtime on January 9. No change today. Tolerating Zyprexa well----Improving slowly  Not at baseline yet per family.  Continue Remeron 30 mg orally disintegrating tablet daily at bedtime  As patient refuses medications for 3 days.  She was started on non emergency forced medications on 04/03/15. --- She has been compliant since January 3  Insomnia: continue Ativan  0.5 mg daily at bedtime.   Poor oral intake: much improved.  Continue ensure tid.  Continue vitamins (liq) q day.  Excellent oral intake.   No longer an issue  UTI. She was given Rocephin on medical floor. We were able to recheck UA on January 6. UA was clear  Constipation: Pt will received Mgcitrate again today.  Continue Colace and Senokot  Back pain: continue ibuprofen prn  Disposition. She will be discharged to home with her family. She will follow up with her local provider. SW will try to refer her for ACT.  Labs:RPR, HIV--non reactive vitamin B12 wnl. Ammonia was within the normal limits. CBC, basic metabolic panel and UA  completed on January 5 and are within the normal limits  Spoke with her son ,[name], [phone]. He reports that she did have a history of "mood swings, she was given treatment after one of her pregnancies. He reports that she has been doing well up until the summer when Tony's son was placed in foster care and then the patient's sister passed away. The patient and son became homeless for 2 months and had to leave at the homeless shelter. They are now living with  the patient's mother.  Possible d/c home early next week  Hernandez-Gonzalez,  Seth Bake

## 2015-04-12 NOTE — Progress Notes (Signed)
D:  Per pt self inventory pt reports sleeping fair, appetite fair, energy level normal, ability to pay attention fair, rates depression at a 3 out of 10, hopelessness at a 0 out of 10, anxiety at a 3 out of 10, denies SI/HI/AVH, goal today: "Take a shower and go to groups", flat/suspicious/cautious during interaction, denies SI/HI/AVH.    A:  Emotional support provided, Encouraged pt to continue with treatment plan and attend all group activities, q15 min checks maintained for safety.  R:  Pt is receptive, going to groups, pleasant with staff and other patients on the unit, med compliant but takes encouragement due to pt being very suspicious with meds.

## 2015-04-12 NOTE — BHH Group Notes (Signed)
[place] Group Notes:  (Nursing/MHT/Case Management/Adjunct)  Date:  04/12/2015  Time:  9:21 AM  Type of Therapy:  Community Meeting   Participation Level:  Did Not Attend  [place] Plasencia De'Chelle Sweta Halseth 04/12/2015, 9:21 AM

## 2015-04-12 NOTE — BHH Group Notes (Signed)
[facility] Group Notes:  (Nursing/MHT/Case Management/Adjunct)  Date:  04/12/2015  Time:  2:26 PM  Type of Therapy:  Psychoeducational Skills  Participation Level:  Minimal  Participation Quality:  Resistant  Affect:  Flat  Cognitive:  Appropriate  Insight:  Appropriate  Engagement in Group:  Lacking and Limited  Modes of Intervention:  Discussion and Education  Summary of Progress/Problems:  Sharon Woods 04/12/2015, 2:26 PM

## 2015-04-13 MED ORDER — MIRTAZAPINE 30 MG PO TABS: 30.0000 mg | ORAL_TABLET | Freq: Every day | ORAL | Status: DC

## 2015-04-13 MED ORDER — OLANZAPINE 15 MG PO TABS: 30.0000 mg | ORAL_TABLET | Freq: Every day | ORAL | Status: DC

## 2015-04-13 NOTE — Progress Notes (Signed)
.  D: Pt denies SI/HI/AVH. Pt is pleasant and cooperative. Pt stated she feels better from taking medications, she appears less anxious and is interacting with peers and staff appropriately.  A: Pt was offered support and encouragement. Pt was given scheduled medications. Pt was encouraged to attend groups. Q 15 minute checks were done for safety.  R:Pt attends groups and interacts well with peers and staff. Pt is taking medication. Pt has no complaints.Pt receptive to treatment and safety maintained on unit.

## 2015-04-13 NOTE — Plan of Care (Signed)
Problem: Alteration in thought process Goal: LTG-Patient behavior demonstrates decreased signs psychosis (Patient behavior demonstrates decreased signs of psychosis to the point the patient is safe to return home and continue treatment in an outpatient setting.)  Outcome: Progressing Patient appears to be less confused

## 2015-04-13 NOTE — Progress Notes (Signed)
Recreation Therapy Notes  Date: 01.13.17 Time: 3:15 pm Location: Craft Room  Group Topic: Communication, Problem Solving, Teamwork  Goal Area(s) Addresses:  Patient will effectively work with peer towards shared goal. Patient will identify skills used to make activity successful. Patient will identify benefit of using group skills effectively post d/c.  Behavioral Response: Attentive, Interactive  Intervention: [employer]  Activity: Patients were given 15 pipe cleaners and instructed to build a free standing tower. After about 6 minutes of them building, patients were instructed to put their dominant hand behind their backs.  Education: LRT educated patients on how they can use these skills outside of the hospital.  Education Outcome: In group clarification offered   Clinical Observations/Feedback: Patient completed activity by working with peers to build tower. Patient used communication, problem solving, and teamwork. Patient contributed to group discussion by stating that her team worked together effectively.  Leonette Monarch, LRT/CTRS 04/13/2015 4:28 PM

## 2015-04-13 NOTE — Progress Notes (Signed)
D:  Patient was alert but questionably oriented on the unit today.  Patient's mood is sad and anxious, however, slightly less anxious than yesterday.  Patient did participate in recreational group today and sat outside and chatted with peers.  Patient stated her goal today is to "get better."  Patient stated she would try to attend groups today.  Patient denies suicidal ideation, homicidal ideation, auditory or visual hallucinations at the current time.   A:  Scheduled medications were administered to the patient per MD orders.  Emotional support and encouragement were provided.  Patient was maintained on q.15 minute safety checks.  Patient was informed to notify staff with any questions or concerns. R:  No adverse medication reactions were noted.  Patient was cooperative with medication administration this shift.  Patient is receptive, calm and cooperative.  Patient contracts for safety at this time.  Patient remains safe on the unit.

## 2015-04-13 NOTE — Progress Notes (Signed)
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 Started on non emergency forced medications on 04/03/15. The patient is now eating.. Thought blocking no longer present.  Continues to have depressed mood, anxiety,  poor attention and poor concentration and very low frustration tolerance. Thought process is now positive for perseveration.   There has been some improvement since dose of olanzapine was increased from 20 mg to 30 mg.. Yesterday and today she has been approaching the nurses more often with appropriate questions and asking for help which she was not doing before.  She is is still fixated on the same issues and continues to ask the same questions repeatedly.  "When am I  going home, how am I getting home, I don't have any clothes"   Denies SI, HI or auditory or visual hallucinations, she told me that earlier in the week she was hearing voices.   Oral intake has been excellent patient has been eating between 90-100% of her meals consistently for longer than one week.  No side effects from medication.  As far as physical complaints she has been c/o on and off of constipation.  She received Mg citrate on Tuesday and Thursday  Patient son came to visit her on 1/10--- he feels she is not at baseline   Per nursing:  D: Pt denies SI/HI/AVH. Pt is pleasant and cooperative. Pt stated she feels better from taking medications, she appears less anxious and is interacting with peers and staff appropriately.  A: Pt was offered support and encouragement. Pt was given scheduled medications. Pt was encouraged to attend groups. Q 15 minute checks were done for safety.  R:Pt attends groups and interacts well with peers and staff. Pt is taking medication. Pt has no complaints.Pt receptive to treatment and safety maintained on unit.  Principal Problem: Severe major  depression, single episode, with psychotic features, mood-congruent ([place]) Diagnosis:   Patient Active Problem List   Diagnosis Date Noted  . Severe major depression, single episode, with psychotic features, mood-congruent ([place]) [F32.3] 03/29/2015  . Protein-calorie malnutrition, severe [E43] 03/26/2015  . 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  Past Medical History:  Past Medical History  Diagnosis Date  . Patient denies medical problems     Past Surgical History  Procedure Laterality Date  . No past surgeries     Family History:  Family History  Problem Relation Age of Onset  . Cancer    . Dementia     Family Psychiatric  History: See H&P.  Social History:  History  Alcohol Use No     History  Drug Use No    Social History   Social History  . Marital Status: Married    Spouse Name: N/A  . Number of Children: N/A  . Years of Education: N/A   Social History Main Topics  . Smoking status: Never Smoker   . Smokeless tobacco: None  . Alcohol Use: No  . Drug Use: No  . Sexual Activity: Not Asked     Comment: unable to assess    Other Topics Concern  . None   Social History Narrative   Additional Social History:    Pain Medications:  (na) Prescriptions:  (na) Over the Counter:  (na) History of alcohol / drug use?: No history of alcohol / drug  abuse Negative Consequences of Use: Personal relationships Withdrawal Symptoms: Other (Comment) (na)     Sleep: Good  Appetite:  Good  Current Medications: Current Facility-Administered Medications  Medication Dose Route Frequency Provider Last Rate Last Dose  . acetaminophen (TYLENOL) tablet 650 mg  650 mg Oral Q6H PRN Gonzella Lex, MD   650 mg at 04/04/15 2144  . alum & mag hydroxide-simeth (MAALOX/MYLANTA) 200-200-20 MG/5ML suspension 30 mL  30 mL Oral Q4H PRN Gonzella Lex, MD      . docusate sodium (COLACE) capsule 200 mg  200 mg  Oral BID Hildred Priest, MD   200 mg at 04/13/15 0831  . feeding supplement (ENSURE ENLIVE) (ENSURE ENLIVE) liquid 237 mL  237 mL Oral TID BM Jolanta B Pucilowska, MD   237 mL at 04/13/15 1030  . geriatric multivitamins-minerals (ELDERTONIC/GEVRABON) elixir ELIX 15 mL  15 mL Oral Daily Hildred Priest, MD   15 mL at 04/13/15 1008  . ibuprofen (ADVIL,MOTRIN) tablet 600 mg  600 mg Oral Q6H PRN Hildred Priest, MD      . LORazepam (ATIVAN) tablet 0.5 mg  0.5 mg Oral QHS Hildred Priest, MD   0.5 mg at 04/12/15 2140  . magnesium citrate solution 0.5 Bottle  0.5 Bottle Oral Once Hildred Priest, MD   0.5 Bottle at 04/12/15 0000  . magnesium hydroxide (MILK OF MAGNESIA) suspension 30 mL  30 mL Oral Daily PRN Gonzella Lex, MD   30 mL at 04/13/15 0831  . mirtazapine (REMERON SOL-TAB) disintegrating tablet 30 mg  30 mg Oral QHS Gonzella Lex, MD   30 mg at 04/12/15 2140  . OLANZapine zydis (ZYPREXA) disintegrating tablet 30 mg  30 mg Oral QHS Hildred Priest, MD   30 mg at 04/12/15 2139  . senna-docusate (Senokot-S) tablet 2 tablet  2 tablet Oral QHS Hildred Priest, MD   2 tablet at 04/12/15 2140    Lab Results:  No results found for this or any previous visit (from the past 48 hour(s)).  Physical Findings: AIMS: Facial and Oral Movements Muscles of Facial Expression: None, normal Lips and Perioral Area: None, normal Jaw: None, normal Tongue: None, normal,Extremity Movements Upper (arms, wrists, hands, fingers): None, normal Lower (legs, knees, ankles, toes): None, normal, Trunk Movements Neck, shoulders, hips: None, normal, Overall Severity Severity of abnormal movements (highest score from questions above): None, normal Incapacitation due to abnormal movements: None, normal Patient's awareness of abnormal movements (rate only patient's report): No Awareness, Dental Status Current problems with teeth and/or dentures?:  No Does patient usually wear dentures?: No  CIWA:  CIWA-Ar Total: 2 COWS:  COWS Total Score: 1  Musculoskeletal: Strength & Muscle Tone: within normal limits Gait & Station: normal Patient leans: N/A  Psychiatric Specialty Exam: Review of Systems  Constitutional: Negative.   HENT: Negative.   Eyes: Negative.   Respiratory: Negative.   Cardiovascular: Negative.   Gastrointestinal: Negative.   Genitourinary: Negative.   Musculoskeletal: Negative.  Negative for myalgias, back pain, joint pain, falls and neck pain.  Skin: Negative.   Neurological: Negative.   Endo/Heme/Allergies: Negative.   Psychiatric/Behavioral: Negative.     Blood pressure 129/82, pulse 73, temperature 98.3 F (36.8 C), temperature source Oral, resp. rate 20, height 5\' 4"  (1.626 m), weight 48.535 kg (107 lb), SpO2 98 %.Body mass index is 18.36 kg/(m^2).  General Appearance: Disheveled  Eye Sport and exercise psychologist::  Fair  Speech:  Clear and Coherent and Normal Rate  Volume:  Normal  Mood:  Anxious and Depressed  Affect:  Blunt  Thought Process:  concrete, perseverates  Orientation:  Full (Time, Place, and Person)  Thought Content:  Rumination  Suicidal Thoughts:  No  Homicidal Thoughts:  No  Memory:  Immediate;   Fair Recent;   Fair Remote;   Fair  Judgement:  Poor  Insight:  Lacking  Psychomotor Activity:  Decreased  Concentration:  Poor  Recall:  Poor  Fund of Knowledge:Poor  Language: Poor  Akathisia:  No  Handed:  Right  AIMS (if indicated):     Assets:  Communication Skills Desire for Improvement Financial Resources/Insurance Housing Physical Health Resilience Social Support  ADL's:  Intact  Cognition: WNL  Sleep:  Number of Hours: 8   Treatment Plan Summary: Daily contact with patient to assess and evaluate symptoms and progress in treatment and Medication management   Sharon Woods is a 55 year old female with no past psychiatric history admitted for catatonia and altered mental  status  Psychosis, depression, catatonia : Thought blocking is not longer present. However patient continues to be anxious, appears depressed and reports feeling very fearful. She stills becomes frustrated quickly.  Patient was perseverating on not having a stable place to go, not having transportation to go home.  Even after I answer all these questions and explained to her that she will be discharged, once stable, back to her mother's house and that we can arrange for transportation at that time, she keeps repeating the same concern. .  Continue olanzapine zydis dose was increased from 20 mg to 30 mg daily at bedtime on January 9. No change today. Tolerating Zyprexa well----Improving slowly. We are working towards discharge next week   Continue Remeron 30 mg orally disintegrating tablet daily at bedtime  As patient refuses medications for 3 days.  She was started on non emergency forced medications on 04/03/15. --- She has been compliant since January 3  Insomnia: continue Ativan  0.5 mg daily at bedtime.   Poor oral intake: much improved.  Continue ensure tid.  Continue vitamins (liq) q day.  Excellent oral intake.   No longer an issue  UTI. She was given Rocephin on medical floor. We were able to recheck UA on January 6. UA was clear  Constipation: Pt will received Mgcitrate again yesterday.  Continue Colace and Senokot  Back pain: continue ibuprofen prn  Disposition. She will be discharged to home with her family. She will follow up with her local provider. SW will try to refer her for ACT.  Referrals made to [facility] and [facility], awaiting call back  Labs:RPR, HIV--non reactive vitamin B12 wnl. Ammonia was within the normal limits. CBC, basic metabolic panel and UA completed on January 5 and are within the normal limits  Spoke with her son ,[name], [phone]. He reports that she did have a history of "mood swings, she was given treatment after one of her pregnancies. He reports that she  has been doing well up until the summer when Tony's son was placed in foster care and then the patient's sister passed away. The patient and son became homeless for 2 months and had to leave at the homeless shelter. They are now living with the patient's mother.  Possible d/c home early next week  Hernandez-Gonzalez,  Seth Bake

## 2015-04-13 NOTE — [facility] Group Notes (Signed)
[place] Group Notes:  (Nursing/MHT/Case Management/Adjunct)  Date:  04/13/2015  Time:  1:02 PM  Type of Therapy:  Movement Therapy  Participation Level:  Active  Participation Quality:  Appropriate  Affect:  Appropriate  Cognitive:  Alert, Appropriate and Oriented  Insight:  Improving  Engagement in Group:  Improving  Modes of Intervention:  Activity  Summary of Progress/Problems:  Sharon Woods 04/13/2015, 1:02 PM

## 2015-04-13 NOTE — Plan of Care (Signed)
Problem: Alteration in mood Goal: LTG-Pt's behavior demonstrates decreased signs of depression (Patient's behavior demonstrates decreased signs of depression to the point the patient is safe to return home and continue treatment in an outpatient setting)  Outcome: Progressing Patient interacting appropriately with peers.

## 2015-04-14 NOTE — Plan of Care (Signed)
Problem: Alteration in mood Goal: LTG-Patient reports reduction in suicidal thoughts (Patient reports reduction in suicidal thoughts and is able to verbalize a safety plan for whenever patient is feeling suicidal)  Outcome: Progressing Pt denies SI, notes she is ready to go home and does not know what to do to leave. States she is confused about what to do when she gets out of the hospital.  Goal: LTG-Pt's behavior demonstrates decreased signs of depression (Patient's behavior demonstrates decreased signs of depression to the point the patient is safe to return home and continue treatment in an outpatient setting)  Outcome: Progressing Pt is interactive in the milieu and med compliant. She notes her current source of depression because she is not at home.  Goal: STG-Patient is able to discuss feelings and issues (Patient is able to discuss feelings and issues leading to depression)  Outcome: Progressing Able to discuss feelings and issues. Patient interacts as in a manner that suggest cognitive limitations.

## 2015-04-14 NOTE — B[facility] Group Notes (Signed)
[place] Group Notes:  (Nursing/MHT/Case Management/Adjunct)  Date:  04/14/2015  Time:  10:47 AM  Type of Therapy:  Psychoeducational Skills  Participation Level:  Minimal  Participation Quality:  Attentive  Affect:  Flat  Cognitive:  Alert  Insight:  Lacking  Engagement in Group:  Lacking  Modes of Intervention:  Education  Summary of Progress/Problems:  Rinaldo Cloud 04/14/2015, 10:47 AM

## 2015-04-14 NOTE — Progress Notes (Signed)
D: Pt denies SI/HI/AVH. Pt is pleasant and cooperative, affect is bright and thoughts are logical. Patient  appears less anxious and she is interacting with peers and staff appropriately.  A: Pt was offered support and encouragement. Pt was given scheduled medications. Pt was encouraged to attend groups. Q 15 minute checks were done for safety.  R:Pt attends groups and interacts well with peers and staff. Pt is taking medication. Pt has no complaints.Pt receptive to treatment and safety maintained on unit.

## 2015-04-14 NOTE — Plan of Care (Signed)
Problem: Alteration in thought process Goal: LTG-Patient behavior demonstrates decreased signs psychosis (Patient behavior demonstrates decreased signs of psychosis to the point the patient is safe to return home and continue treatment in an outpatient setting.)  Outcome: Progressing Alert and oriented.  No signs of hallucinations or responding to any stimuli.  Pleasant and cooperative. Able to identify surroundings appropriately.

## 2015-04-14 NOTE — Progress Notes (Signed)
Denies SI/HI/AVH.  Denies depression.  Affect brighter.  More visible in milieu.  Thought processes logical and coherent. Support and encouragement offered.  Safety maintained.

## 2015-04-14 NOTE — Plan of Care (Signed)
Problem: Alteration in mood Goal: LTG-Patient reports reduction in suicidal thoughts (Patient reports reduction in suicidal thoughts and is able to verbalize a safety plan for whenever patient is feeling suicidal)  Outcome: Progressing Patient denies SI/HI.      

## 2015-04-14 NOTE — BHH Group Notes (Signed)
[place] Group Notes:  (Nursing/MHT/Case Management/Adjunct)  Date:  04/14/2015  Time:  10:49 PM  Type of Therapy:  Group Therapy  Participation Level:  Minimal  Participation Quality:  Attentive  Affect:  Flat  Cognitive:  Appropriate  Insight:  Appropriate  Engagement in Group:  Limited  Modes of Intervention:  Support  Summary of Progress/Problems:  Sharon Woods 04/14/2015, 10:49 PM

## 2015-04-14 NOTE — BHH Group Notes (Signed)
[place] LCSW Group Therapy  04/14/2015 3:55 PM  Type of Therapy:  Group Therapy  Participation Level:  Minimal  Participation Quality:  Attentive  Affect:  Depressed  Cognitive:  Alert  Insight:  Limited  Engagement in Therapy:  Limited  Modes of Intervention:  Discussion, Education, Socialization and Support  Summary of Progress/Problems: Pt will identify unhealthy thoughts and how they impact their emotions and behavior. Pt will be encouraged to discuss these thoughts, emotions and behaviors with the group. Pt attended group and stayed the entire time. She sat quietly and listened to other group members.   [place] MSW, [place]  04/14/2015, 3:55 PM

## 2015-04-14 NOTE — Progress Notes (Signed)
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 Started on non emergency forced medications on 04/03/15. The patient is now eating.. Thought blocking no longer present.  Continues to have depressed mood, anxiety,  poor attention and poor concentration and very low frustration tolerance. Thought process is now positive for perseveration.   There has been some improvement since dose of olanzapine was increased from 20 mg to 30 mg.   She is is still fixated on the same issues and continues to ask the same questions repeatedly.  "When am I  going home, how am I getting home, I don't have any clothes"   Denies SI, HI or auditory or visual hallucinations.   Oral intake has been excellent patient has been eating all of her meals  No side effects from medication.  As far as physical complaints she has been c/o on and off of constipation.  She received Mg citrate on Tuesday and Thursday.  Today she did not report any constipation  Patient son came to visit her on 1/10--- he feels she is not at baseline   Per nursing:  D: Pt denies SI/HI/AVH. Pt is pleasant and cooperative, affect is bright and thoughts are logical. Patient appears less anxious and she is interacting with peers and staff appropriately.  A: Pt was offered support and encouragement. Pt was given scheduled medications. Pt was encouraged to attend groups. Q 15 minute checks were done for safety.  R:Pt attends groups and interacts well with peers and staff. Pt is taking medication. Pt has no complaints.Pt receptive to treatment and safety maintained on unit.  Principal Problem: Severe major depression, single episode, with psychotic features, mood-congruent ([place]) Diagnosis:   Patient Active Problem List   Diagnosis Date Noted  . Severe major depression, single episode, with psychotic features,  mood-congruent ([place]) [F32.3] 03/29/2015  . Protein-calorie malnutrition, severe [E43] 03/26/2015  . 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  Past Medical History:  Past Medical History  Diagnosis Date  . Patient denies medical problems     Past Surgical History  Procedure Laterality Date  . No past surgeries     Family History:  Family History  Problem Relation Age of Onset  . Cancer    . Dementia     Family Psychiatric  History: See H&P.  Social History:  History  Alcohol Use No     History  Drug Use No    Social History   Social History  . Marital Status: Married    Spouse Name: N/A  . Number of Children: N/A  . Years of Education: N/A   Social History Main Topics  . Smoking status: Never Smoker   . Smokeless tobacco: None  . Alcohol Use: No  . Drug Use: No  . Sexual Activity: Not Asked     Comment: unable to assess    Other Topics Concern  . None   Social History Narrative   Additional Social History:    Pain Medications:  (na) Prescriptions:  (na) Over the Counter:  (na) History of alcohol / drug use?: No history of alcohol / drug abuse Negative Consequences of Use: Personal relationships Withdrawal Symptoms: Other (Comment) (na)     Sleep: Good  Appetite:  Good  Current Medications: Current Facility-Administered Medications  Medication Dose Route Frequency Provider  Last Rate Last Dose  . acetaminophen (TYLENOL) tablet 650 mg  650 mg Oral Q6H PRN Gonzella Lex, MD   650 mg at 04/04/15 2144  . alum & mag hydroxide-simeth (MAALOX/MYLANTA) 200-200-20 MG/5ML suspension 30 mL  30 mL Oral Q4H PRN Gonzella Lex, MD      . docusate sodium (COLACE) capsule 200 mg  200 mg Oral BID Hildred Priest, MD   200 mg at 04/13/15 2129  . feeding supplement (ENSURE ENLIVE) (ENSURE ENLIVE) liquid 237 mL  237 mL Oral TID BM Jolanta B Pucilowska, MD   237 mL at 04/14/15 1000  .  geriatric multivitamins-minerals (ELDERTONIC/GEVRABON) elixir ELIX 15 mL  15 mL Oral Daily Hildred Priest, MD   15 mL at 04/14/15 1010  . ibuprofen (ADVIL,MOTRIN) tablet 600 mg  600 mg Oral Q6H PRN Hildred Priest, MD      . LORazepam (ATIVAN) tablet 0.5 mg  0.5 mg Oral QHS Hildred Priest, MD   0.5 mg at 04/13/15 2129  . magnesium citrate solution 0.5 Bottle  0.5 Bottle Oral Once Hildred Priest, MD   0.5 Bottle at 04/12/15 0000  . magnesium hydroxide (MILK OF MAGNESIA) suspension 30 mL  30 mL Oral Daily PRN Gonzella Lex, MD   30 mL at 04/13/15 0831  . mirtazapine (REMERON SOL-TAB) disintegrating tablet 30 mg  30 mg Oral QHS Gonzella Lex, MD   30 mg at 04/13/15 2129  . OLANZapine zydis (ZYPREXA) disintegrating tablet 30 mg  30 mg Oral QHS Hildred Priest, MD   30 mg at 04/13/15 2129  . senna-docusate (Senokot-S) tablet 2 tablet  2 tablet Oral QHS Hildred Priest, MD   2 tablet at 04/13/15 2129    Lab Results:  No results found for this or any previous visit (from the past 48 hour(s)).  Physical Findings: AIMS: Facial and Oral Movements Muscles of Facial Expression: None, normal Lips and Perioral Area: None, normal Jaw: None, normal Tongue: None, normal,Extremity Movements Upper (arms, wrists, hands, fingers): None, normal Lower (legs, knees, ankles, toes): None, normal, Trunk Movements Neck, shoulders, hips: None, normal, Overall Severity Severity of abnormal movements (highest score from questions above): None, normal Incapacitation due to abnormal movements: None, normal Patient's awareness of abnormal movements (rate only patient's report): No Awareness, Dental Status Current problems with teeth and/or dentures?: No Does patient usually wear dentures?: No  CIWA:  CIWA-Ar Total: 2 COWS:  COWS Total Score: 1  Musculoskeletal: Strength & Muscle Tone: within normal limits Gait & Station: normal Patient leans:  N/A  Psychiatric Specialty Exam: Review of Systems  Constitutional: Negative.   HENT: Negative.   Eyes: Negative.   Respiratory: Negative.   Cardiovascular: Negative.   Gastrointestinal: Negative.   Genitourinary: Negative.   Musculoskeletal: Negative.  Negative for myalgias, back pain, joint pain, falls and neck pain.  Skin: Negative.   Neurological: Negative.   Endo/Heme/Allergies: Negative.   Psychiatric/Behavioral: Negative.     Blood pressure 115/77, pulse 83, temperature 98.2 F (36.8 C), temperature source Oral, resp. rate 20, height 5\' 4"  (1.626 m), weight 48.535 kg (107 lb), SpO2 98 %.Body mass index is 18.36 kg/(m^2).  General Appearance: Disheveled  Eye Sport and exercise psychologist::  Fair  Speech:  Clear and Coherent and Normal Rate  Volume:  Normal  Mood:  Anxious and Depressed  Affect:  Constricted  Thought Process:  concrete, perseverates  Orientation:  Full (Time, Place, and Person)  Thought Content:  Rumination  Suicidal Thoughts:  No  Homicidal Thoughts:  No  Memory:  Immediate;   Fair Recent;   Fair Remote;   Fair  Judgement:  Fair  Insight:  Lacking  Psychomotor Activity:  Decreased  Concentration:  Poor  Recall:  Poor  Fund of Knowledge:Poor  Language: Poor  Akathisia:  No  Handed:  Right  AIMS (if indicated):     Assets:  Communication Skills Desire for Improvement Financial Resources/Insurance Housing Physical Health Resilience Social Support  ADL's:  Intact  Cognition: WNL  Sleep:  Number of Hours: 7.5   Treatment Plan Summary: Daily contact with patient to assess and evaluate symptoms and progress in treatment and Medication management   Ms. Yong Channel is a 55 year old female with no past psychiatric history admitted for catatonia and altered mental status  Psychosis, depression, catatonia : Thought blocking is not longer present. However patient continues to be anxious, appears depressed and reports feeling very fearful. She stills becomes frustrated  quickly.  Patient was perseverating on not having a stable place to go, not having transportation to go home.  Even after I answer all these questions and explained to her that she will be discharged, once stable, back to her mother's house and that we can arrange for transportation at that time, she keeps repeating the same concern. .  Continue olanzapine zydis dose was increased from 20 mg to 30 mg daily at bedtime on January 9. No change today.   Continue Remeron 30 mg orally disintegrating tablet daily at bedtime  As patient refuses medications for 3 days.  She was started on non emergency forced medications on 04/03/15. --- She has been compliant since January 3  Insomnia: continue Ativan  0.5 mg daily at bedtime.  Slept 7 hours  Poor oral intake: much improved.  Continue ensure tid.  Continue vitamins (liq) q day.  Excellent oral intake.   No longer an issue  UTI. She was given Rocephin on medical floor.   Constipation:  Continue Colace and Senokot  Back pain: continue ibuprofen prn  Disposition. She will be discharged to home with her family. She will follow up with her local provider. SW will try to refer her for ACT.  Referrals made to [facility] and [facility], awaiting call back  Labs:RPR, HIV--non reactive vitamin B12 wnl. Ammonia was within the normal limits. CBC, basic metabolic panel and UA completed on January 5 and are within the normal limits  Spoke with her son ,[name], [phone]. He reports that she did have a history of "mood swings, she was given treatment after one of her pregnancies. He reports that she has been doing well up until the summer when Tony's son was placed in foster care and then the patient's sister passed away. The patient and son became homeless for 2 months and had to leave at the homeless shelter. They are now living with the patient's mother.  Possible d/c home early next week  Hernandez-Gonzalez,  Seth Bake

## 2015-04-15 MED ORDER — LORAZEPAM 0.5 MG PO TABS
0.5000 mg | ORAL_TABLET | Freq: Four times a day (QID) | ORAL | Status: DC | PRN
  Administered 2015-04-15: 0.5 mg via ORAL
  Filled 2015-04-15: qty 1

## 2015-04-15 NOTE — Plan of Care (Signed)
Problem: Alteration in thought process Goal: LTG-Patient has not harmed self or others in at least 2 days Outcome: Progressing Denies SI or depression.  No thoughts of harming self verbalized.

## 2015-04-15 NOTE — Progress Notes (Signed)
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 Started on non emergency forced medications on 04/03/15. The patient is now eating.. Thought blocking no longer present.  Continues to have depressed mood, anxiety,  poor attention and poor concentration and very low frustration tolerance. Thought process is now positive for perseveration.   There has been some improvement since dose of olanzapine was increased from 20 mg to 30 mg.   She is is still fixated on the same issues and continues to ask the same questions repeatedly.  "When am I  going home, how am I getting home, I don't have any clothes"   Denies SI, HI or auditory or visual hallucinations.   Oral intake has been excellent patient has been eating all of her meals  No side effects from medication.  As far as physical complaints she has been c/o on and off of constipation.  She has received a few doses of magnesium was titrated. He was placed on Colace and Senokot. She is not complaining of diarrhea.  Patient son came to visit her on 1/10--- he feels she is not at baseline   Per nursing:  Alert and oriented. No signs of hallucinations or responding to any stimuli. Pleasant and cooperative. Able to identify surroundings appropriately.   Principal Problem: Severe major depression, single episode, with psychotic features, mood-congruent ([place]) Diagnosis:   Patient Active Problem List   Diagnosis Date Noted  . Severe major depression, single episode, with psychotic features, mood-congruent ([place]) [F32.3] 03/29/2015  . Protein-calorie malnutrition, severe [E43] 03/26/2015  . 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  Past Medical History:  Past Medical History  Diagnosis Date  . Patient denies medical problems     Past  Surgical History  Procedure Laterality Date  . No past surgeries     Family History:  Family History  Problem Relation Age of Onset  . Cancer    . Dementia     Family Psychiatric  History: See H&P.  Social History:  History  Alcohol Use No     History  Drug Use No    Social History   Social History  . Marital Status: Married    Spouse Name: N/A  . Number of Children: N/A  . Years of Education: N/A   Social History Main Topics  . Smoking status: Never Smoker   . Smokeless tobacco: None  . Alcohol Use: No  . Drug Use: No  . Sexual Activity: Not Asked     Comment: unable to assess    Other Topics Concern  . None   Social History Narrative   Additional Social History:    Pain Medications:  (na) Prescriptions:  (na) Over the Counter:  (na) History of alcohol / drug use?: No history of alcohol / drug abuse Negative Consequences of Use: Personal relationships Withdrawal Symptoms: Other (Comment) (na)     Sleep: Good  Appetite:  Good  Current Medications: Current Facility-Administered Medications  Medication Dose Route Frequency Provider Last Rate Last Dose  . acetaminophen (TYLENOL) tablet 650 mg  650 mg Oral Q6H PRN Gonzella Lex, MD   650 mg at 04/04/15 2144  . alum & mag hydroxide-simeth (MAALOX/MYLANTA) 200-200-20 MG/5ML suspension 30 mL  30 mL Oral Q4H PRN Gonzella Lex, MD      .  feeding supplement (ENSURE ENLIVE) (ENSURE ENLIVE) liquid 237 mL  237 mL Oral TID BM Jolanta B Pucilowska, MD   237 mL at 04/14/15 2000  . geriatric multivitamins-minerals (ELDERTONIC/GEVRABON) elixir ELIX 15 mL  15 mL Oral Daily Hildred Priest, MD   15 mL at 04/14/15 1010  . ibuprofen (ADVIL,MOTRIN) tablet 600 mg  600 mg Oral Q6H PRN Hildred Priest, MD      . LORazepam (ATIVAN) tablet 0.5 mg  0.5 mg Oral QHS Hildred Priest, MD   0.5 mg at 04/14/15 2226  . magnesium citrate solution 0.5 Bottle  0.5 Bottle Oral Once Hildred Priest, MD    0.5 Bottle at 04/12/15 0000  . magnesium hydroxide (MILK OF MAGNESIA) suspension 30 mL  30 mL Oral Daily PRN Gonzella Lex, MD   30 mL at 04/13/15 0831  . mirtazapine (REMERON SOL-TAB) disintegrating tablet 30 mg  30 mg Oral QHS Gonzella Lex, MD   30 mg at 04/14/15 2225  . OLANZapine zydis (ZYPREXA) disintegrating tablet 30 mg  30 mg Oral QHS Hildred Priest, MD   30 mg at 04/14/15 2223    Lab Results:  No results found for this or any previous visit (from the past 48 hour(s)).  Physical Findings: AIMS: Facial and Oral Movements Muscles of Facial Expression: None, normal Lips and Perioral Area: None, normal Jaw: None, normal Tongue: None, normal,Extremity Movements Upper (arms, wrists, hands, fingers): None, normal Lower (legs, knees, ankles, toes): None, normal, Trunk Movements Neck, shoulders, hips: None, normal, Overall Severity Severity of abnormal movements (highest score from questions above): None, normal Incapacitation due to abnormal movements: None, normal Patient's awareness of abnormal movements (rate only patient's report): No Awareness, Dental Status Current problems with teeth and/or dentures?: No Does patient usually wear dentures?: No  CIWA:  CIWA-Ar Total: 2 COWS:  COWS Total Score: 1  Musculoskeletal: Strength & Muscle Tone: within normal limits Gait & Station: normal Patient leans: N/A  Psychiatric Specialty Exam: Review of Systems  Constitutional: Negative.   HENT: Negative.   Eyes: Negative.   Respiratory: Negative.   Cardiovascular: Negative.   Gastrointestinal: Positive for diarrhea.  Genitourinary: Negative.   Musculoskeletal: Negative.  Negative for myalgias, back pain, joint pain, falls and neck pain.  Skin: Negative.   Neurological: Negative.   Endo/Heme/Allergies: Negative.   Psychiatric/Behavioral: Negative.     Blood pressure 105/67, pulse 80, temperature 98 F (36.7 C), temperature source Oral, resp. rate 20, height 5\' 4"   (1.626 m), weight 48.535 kg (107 lb), SpO2 98 %.Body mass index is 18.36 kg/(m^2).  General Appearance: Disheveled  Eye Sport and exercise psychologist::  Fair  Speech:  Clear and Coherent and Normal Rate  Volume:  Normal  Mood:  Anxious and Depressed  Affect:  Constricted  Thought Process:  concrete, perseverates  Orientation:  Full (Time, Place, and Person)  Thought Content:  Rumination  Suicidal Thoughts:  No  Homicidal Thoughts:  No  Memory:  Immediate;   Fair Recent;   Fair Remote;   Fair  Judgement:  Fair  Insight:  Lacking  Psychomotor Activity:  Decreased  Concentration:  Poor  Recall:  Poor  Fund of Knowledge:Poor  Language: Poor  Akathisia:  No  Handed:  Right  AIMS (if indicated):     Assets:  Communication Skills Desire for Improvement Financial Resources/Insurance Housing Physical Health Resilience Social Support  ADL's:  Intact  Cognition: WNL  Sleep:  Number of Hours: 5.25   Treatment Plan Summary: Daily contact with patient to assess and evaluate  symptoms and progress in treatment and Medication management   Sharon Woods is a 55 year old female with no past psychiatric history admitted for catatonia and altered mental status  Psychosis, depression, catatonia : Thought blocking is not longer present. However patient continues to be anxious, appears depressed and reports feeling very fearful. She stills becomes frustrated quickly.  Patient was perseverating on not having a stable place to go, not having transportation to go home.  Even after I answer all these questions and explained to her that she will be discharged, once stable, back to her mother's house and that we can arrange for transportation at that time, she keeps repeating the same concern. .  Continue olanzapine zydis dose was increased from 20 mg to 30 mg daily at bedtime on January 9. No change today. Improving, possible discharge early next week  Continue Remeron 30 mg orally disintegrating tablet daily at  bedtime  As patient refuses medications for 3 days.  She was started on non emergency forced medications on 04/03/15. --- She has been compliant since January 3  Insomnia: continue Ativan  0.5 mg daily at bedtime.  Slept 5 hours  Poor oral intake: much improved.  Continue ensure tid.  Continue vitamins (liq) q day.  Excellent oral intake.   No longer an issue  UTI. She was given Rocephin on medical floor.   Constipation:  I we'll discontinue Colace and Senokot as patient is now complaining of having diarrhea  Back pain: continue ibuprofen prn  Disposition. She will be discharged to home with her family. She will follow up with her local provider. SW will try to refer her for ACT.  Referrals made to [facility] and [facility], awaiting call back  Labs:RPR, HIV--non reactive vitamin B12 wnl. Ammonia was within the normal limits. CBC, basic metabolic panel and UA completed on January 5 and are within the normal limits  Spoke with her son ,[name], [phone]. He reports that she did have a history of "mood swings, she was given treatment after one of her pregnancies. He reports that she has been doing well up until the summer when Tony's son was placed in foster care and then the patient's sister passed away. The patient and son became homeless for 2 months and had to leave at the homeless shelter. They are now living with the patient's mother.  Possible d/c home early next week  Hernandez-Gonzalez,  Seth Bake

## 2015-04-15 NOTE — Plan of Care (Signed)
Problem: Alteration in mood Goal: STG-Patient reports thoughts of self-harm to staff Outcome: Progressing Patient denies suicidal thoughts and agrees to come to staff if she has thoughts to harm herself or others.

## 2015-04-15 NOTE — Progress Notes (Signed)
Sahory conversation was logical for the most part, she talked about her husband and their anniversary. She was medication compliant but she was concerned with the amount of pills she was taken and was a little hesitant to take them, she remains cooperative but anxious. She drunk her ensure at her bedside then went to bed. She appears to be sleep at this time. Resting in bed quietly.

## 2015-04-15 NOTE — Discharge Summary (Addendum)
Physician Discharge Summary Note  Patient:  Sharon Woods is an 55 y.o., female MRN:  [id number] DOB:  [birth year] Patient phone:  [phone] (home)  Patient address:   [address] [place] [address],  Total Time spent with patient: 45 minutes  Date of Admission:  03/29/2015 Date of Discharge: 04/16/15  Reason for Admission:  Psychosis, depression, catatonia  Principal Problem: Severe major depression, single episode, with psychotic features, mood-congruent [facility]) Discharge Diagnoses: Patient Active Problem List   Diagnosis Date Noted  . Severe major depression, single episode, with psychotic features, mood-congruent ([place]) [F32.3] 03/29/2015  . Protein-calorie malnutrition, severe [E43] 03/26/2015  . 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 Patient had initially presented to the hospital with probably 3 weeks or more of not eating and not consuming much liquid not sleeping poor self-care. She was initially not able to give a history and appeared to be partially catatonic. Son was present at that time and reported that she had become much more depressed and withdrawn in the months since her sister had died. Patient had not been actively suicidal. She did seem at times to be responding to internal stimuli and when I interview her today is able to say that she does hear strange noises at times. She still reports that her mood is bad. Denies any active plan to kill her self. Patient is not a very good historian she has improved mentally but is still pretty withdrawn and seems confused at times. There is no history of substance abuse. Patient was initially resistant to medicine but has been now taking oral Zyprexa and Remeron as ordered. I had suggested that we consider ECT when she was not eating but we have table that the moment.  Associated Signs/Symptoms: Depression Symptoms: depressed  mood, anhedonia, insomnia, psychomotor retardation, difficulty concentrating, hopelessness, impaired memory, recurrent thoughts of death, loss of energy/fatigue, disturbed sleep, (Hypo) Manic Symptoms: None Anxiety Symptoms: Excessive Worry, Psychotic Symptoms: Delusions, Hallucinations: Auditory Paranoia, PTSD Symptoms: Negative Total Time spent with patient: 1 hour  Past Psychiatric History: Patient denies having had psychiatric treatment in the past. No known past history of psychiatric treatment no known history of suicide attempts or psychiatric medicine   Past Medical History:  Past Medical History  Diagnosis Date  . Patient denies medical problems     Past Surgical History  Procedure Laterality Date  . No past surgeries     Family History:  Family History  Problem Relation Age of Onset  . Cancer    . Dementia     Family Psychiatric History: Her sister who passed away 3 months ago may have had depression in the history is a little unclear. Social History:  History  Alcohol Use No    History  Drug Use No    Social History   Social History  . Marital Status: Married    Spouse Name: N/A  . Number of Children: N/A  . Years of Education: N/A   Social History Main Topics  . Smoking status: Never Smoker   . Smokeless tobacco: None  . Alcohol Use: No  . Drug Use: No  . Sexual Activity: Not Asked     Comment: unable to assess          Hospital Course:    Sharon Woods is a 55 year old female with no past psychiatric history admitted for catatonia and altered mental status  Psychosis,  depression, catatonia : Thought blocking is not longer present. However patient continues to be anxious, appears depressed and reports feeling very fearful. She stills becomes frustrated quickly. Patient was perseverating on not having a stable place to go, not having transportation to go home.  Even after I answer all these questions and explained to her that she will be discharged, once stable, back to her mother's house and that we can arrange for transportation at that time, she keeps repeating the same concern. .  Patient will be discharged on olanzapine 30 mg by mouth daily at bedtime.  For depression she will be discharged on mirtazapine 30 mg by mouth daily at bedtime.  As patient refuses medications for 3 days. She was started on non emergency forced medications on 04/03/15. --- She has been compliant since January 3  Insomnia: During her hospitalization she received 0.5 mg of Ativan at bedtime. She will not be discharged on this medication.   Poor oral intake: much improved. Continue ensure tid. Continue vitamins (liq) q day. Excellent oral intake. No longer an issue  UTI. She was given Rocephin on medical floor.   Constipation: Patient receive a few doses of magnesium sidetracked. She was also treated with Colace and Senokot. His medications were discontinued prior to discharge as she later on complaining of diarrhea.  Disposition. She will be discharged to home with her family. She will follow up with her local provider. SW will try to refer her for ACT.   Labs:RPR, HIV--non reactive vitamin B12 wnl. Ammonia was within the normal limits. CBC, basic metabolic panel and UA completed on January 5 and are within the normal limits  Spoke with her son ,[name], [phone]. He reports that she did have a history of "mood swings, she was given treatment after one of her pregnancies. He reports that she has been doing well up until the summer when Tony's son was placed in foster care and then the patient's sister passed away. The patient and son became homeless for 2 months and had to leave at the homeless shelter. They are now living with the patient's mother.  During her hospitalization the patient had a slow improvement due to the severity of her condition. Patient was  paranoid and was unable to consent for medications. She received non-emergency forced medications during the early part of her hospitalization.  At no time there was need for seclusion or restraints.  Patient never displayed any aggressive behavior.  On discharge the patient was no longer displaying catatonia or thought blocking. Her oral intake significantly improved and was eating 100% of all her meals. She was no longer in need of forced medications as she was taking oral medications willingly.  She will be provided with a 7 day supply of medications and we will make a referral for the medication management for assistance with her 30 day prescription for olanzapine and Remeron.  Physical Findings: AIMS: Facial and Oral Movements Muscles of Facial Expression: None, normal Lips and Perioral Area: None, normal Jaw: None, normal Tongue: None, normal,Extremity Movements Upper (arms, wrists, hands, fingers): None, normal Lower (legs, knees, ankles, toes): None, normal, Trunk Movements Neck, shoulders, hips: None, normal, Overall Severity Severity of abnormal movements (highest score from questions above): None, normal Incapacitation due to abnormal movements: None, normal Patient's awareness of abnormal movements (rate only patient's report): No Awareness, Dental Status Current problems with teeth and/or dentures?: No Does patient usually wear dentures?: No  CIWA:  CIWA-Ar Total: 2 COWS:  COWS Total Score:  1  Musculoskeletal: Strength & Muscle Tone: within normal limits Gait & Station: normal Patient leans: N/A  Psychiatric Specialty Exam: Review of Systems  Constitutional: Negative.   HENT: Negative.   Eyes: Negative.   Respiratory: Negative.   Cardiovascular: Negative.   Gastrointestinal: Negative.   Genitourinary: Negative.   Musculoskeletal: Negative.   Skin: Negative.   Neurological: Negative.   Endo/Heme/Allergies: Negative.   Psychiatric/Behavioral: Positive for  depression. Negative for suicidal ideas, hallucinations, memory loss and substance abuse. The patient is nervous/anxious. The patient does not have insomnia.     Blood pressure 105/68, pulse 83, temperature 99.4 F (37.4 C), temperature source Oral, resp. rate 20, height 5' 4"  (1.626 m), weight 48.535 kg (107 lb), SpO2 98 %.Body mass index is 18.36 kg/(m^2).  General Appearance: Disheveled  Eye Contact::  Good  Speech:  Clear and Coherent  Volume:  Normal  Mood:  Anxious  Affect:  Congruent  Thought Process:  concrete  Orientation:  Full (Time, Place, and Person)  Thought Content:  Hallucinations: None  Suicidal Thoughts:  No  Homicidal Thoughts:  No  Memory:  Immediate;   Fair Recent;   Fair Remote;   Fair  Judgement:  Fair  Insight:  Fair  Psychomotor Activity:  Normal  Concentration:  Fair  Recall:  [employer] of Knowledge:Poor  Language: Fair  Akathisia:  No  Handed:    AIMS (if indicated):     Assets:  Communication Skills Desire for Improvement Physical Health Social Support  ADL's:  Intact  Cognition: WNL  Sleep:  Number of Hours: 5.99     Metabolic Disorder Labs:  Lab Results  Component Value Date   HGBA1C 5.4 03/30/2015   No results found for: PROLACTIN Lab Results  Component Value Date   CHOL 255* 04/16/2015   TRIG 236* 04/16/2015   HDL 69 04/16/2015   CHOLHDL 3.7 04/16/2015   VLDL 47* 04/16/2015   LDLCALC 139* 04/16/2015    Results for Woods, Sharon L (MRN [id number]) as of 04/15/2015 14:43  Ref. Range 03/30/2015 08:10 03/30/2015 12:10 04/02/2015 15:15 04/05/2015 12:45 04/05/2015 14:05  Sodium Latest Ref Range: 135-145 mmol/L 138    145  Potassium Latest Ref Range: 3.5-5.1 mmol/L 3.5    4.3  Chloride Latest Ref Range: 101-111 mmol/L 103    108  CO2 Latest Ref Range: 22-32 mmol/L 31    30  BUN Latest Ref Range: 6-20 mg/dL 19    20  Creatinine Latest Ref Range: 0.44-1.00 mg/dL 0.53    0.67  Calcium Latest Ref Range: 8.9-10.3 mg/dL 9.5    8.9  EGFR  (Non-African Amer.) Latest Ref Range: >60 mL/min >60    >60  EGFR (African American) Latest Ref Range: >60 mL/min >60    >60  Glucose Latest Ref Range: 65-99 mg/dL 138 (H)    99  Anion gap Latest Ref Range: 5-15  4 (L)    7  Ammonia Latest Ref Range: 9-35 umol/L   15    Vitamin B-12 Latest Ref Range: 180-914 pg/mL   543    WBC Latest Ref Range: 3.6-11.0 K/uL     5.9  RBC Latest Ref Range: 3.80-5.20 MIL/uL     3.75 (L)  Hemoglobin Latest Ref Range: 12.0-16.0 g/dL     11.5 (L)  HCT Latest Ref Range: 35.0-47.0 %     34.7 (L)  MCV Latest Ref Range: 80.0-100.0 fL     92.5  MCH Latest Ref Range: 26.0-34.0 pg  30.7  MCHC Latest Ref Range: 32.0-36.0 g/dL     33.2  RDW Latest Ref Range: 11.5-14.5 %     15.3 (H)  Platelets Latest Ref Range: 150-440 K/uL     209  Hemoglobin A1C Latest Ref Range: 4.0-6.0 % 5.4      RPR Latest Ref Range: Non Reactive    Non Reactive    HIV Latest Ref Range: Non Reactive    Non Reactive    Appearance Latest Ref Range: CLEAR     CLOUDY (A)   Bacteria, UA Latest Ref Range: NONE SEEN     NONE SEEN   Bilirubin Urine Latest Ref Range: NEGATIVE     NEGATIVE   Color, Urine Latest Ref Range: YELLOW     YELLOW (A)   Glucose Latest Ref Range: NEGATIVE mg/dL    NEGATIVE   Hgb urine dipstick Latest Ref Range: NEGATIVE     NEGATIVE   Ketones, ur Latest Ref Range: NEGATIVE mg/dL    NEGATIVE   Leukocytes, UA Latest Ref Range: NEGATIVE     NEGATIVE   Nitrite Latest Ref Range: NEGATIVE     NEGATIVE   pH Latest Ref Range: 5.0-8.0     7.0   Protein Latest Ref Range: NEGATIVE mg/dL    NEGATIVE   RBC / HPF Latest Ref Range: 0-5 RBC/hpf    NONE SEEN   Specific Gravity, Urine Latest Ref Range: 1.005-1.030     1.012   Squamous Epithelial / LPF Latest Ref Range: NONE SEEN     NONE SEEN   WBC, UA Latest Ref Range: 0-5 WBC/hpf    NONE SEEN        Medication List    TAKE these medications      Indication   mirtazapine 30 MG tablet  Commonly known as:  REMERON  Take 1 tablet (30  mg total) by mouth at bedtime.  Notes to Patient:  depression      OLANZapine 15 MG tablet  Commonly known as:  ZYPREXA  Take 2 tablets (30 mg total) by mouth at bedtime.  Notes to Patient:  psychosis        Follow-up Information    Follow up with [facility].   Why:  Please bring your application for financial and planning assistance for medications, to the walk-in clinic Mon thru Fri from 9-5 pm. Please call for assistance with the application and/or bring it in for a future appointment for medications management   Contact information:   [address], [place], [place] [address] Ph: [phone] Fax: [phone]      Follow up with [facility].   Why:  Please call [facility] for an appointment to have an ACT Team member to visit your home for an assessment for medication managment and therapy.   Contact information:   [place], [place] [address] Ph: [phone] Fax: [phone]     >30 minutes  Signed: Hildred Priest 04/16/2015, 8:43 PM

## 2015-04-15 NOTE — BHH Group Notes (Signed)
[place] LCSW Group Therapy  04/15/2015 5:38 PM  Type of Therapy:  Group Therapy  Participation Level:  Did Not Attend   Modes of Intervention:  Discussion, Education, Socialization and Support  Summary of Progress/Problems: Todays topic: Grudges  Patients will be encouraged to discuss their thoughts, feelings, and behaviors as to why one holds on to grudges and reasons why people have grudges. Patients will process the impact of grudges on their daily lives and identify thoughts and feelings related to holding grudges. Patients will identify feelings and thoughts related to what life would look like without grudges.   [place] MSW, [place]  04/15/2015, 5:38 PM

## 2015-04-15 NOTE — Progress Notes (Signed)
Denies SI/HI/AVH.  No signs of responding to any internal stimuli noted.  Patient verbalized that she needs something to stop from going to the bathroom.  Dr. Jerilee Hoh informed of patient c/o diarrhea. Minimal interaction noted with peers.  Patient eating her meals and maintains personal hygiene. Mild confusion noted at times.  Patient became tearful after talking with the doctor.  When tried to engage patient , patient unable to verbalize what is upsetting her.   Support and encouragement offered.  Safety maintained.

## 2015-04-16 LAB — LIPID PANEL
CHOLESTEROL: 255 mg/dL — AB (ref 0–200)
HDL: 69 mg/dL (ref >40)
LDL Cholesterol: 139 mg/dL — ABNORMAL HIGH (ref 0–99)
TRIGLYCERIDES: 236 mg/dL — AB (ref <150)
Total CHOL/HDL Ratio: 3.7 RATIO
VLDL: 47 mg/dL — ABNORMAL HIGH (ref 0–40)

## 2015-04-16 NOTE — Tx Team (Addendum)
Interdisciplinary Treatment Plan Update (Adult)  Date:  04/16/2015 Time Reviewed:  11:22 AM  Progress in Treatment: Attending groups: Yes. Participating in groups:  Intermittently Taking medication as prescribed:  Intermittently Tolerating medication:  Yes. Family/Significant othe contact made:  Yes, individual(s) contacted:  patient's son Elberta Fortis "Vivien Rota" Grays [phone] Patient understands diagnosis:  No. Discussing patient identified problems/goals with staff:  Yes. Medical problems stabilized or resolved:  Yes. Denies suicidal/homicidal ideation: Yes. Issues/concerns per patient self-inventory:  No. Other:  New problem(s) identified: No, Describe:  none reported  Discharge Plan or Barriers: stabilize on medications and discharge home with family and refer to ACTT and the Medication Management Clinic  Reason for Continuation of Hospitalization: Delusions  Medication stabilization Other; describe paranoia  Comments:  Estimated date of discharge: 04/16/15  New goal(s):  Review of initial/current patient goals per problem list:   1.  Goal(s):particiapte in care planning  Met:  Adequate for discharge per MD.    Target date:by discharge 1/16: Adequate for discharge per MD.  2.  Goal (s):psychosis will be manageable  Met:  Adequate for discharge per MD.                        Target date:by discharge 1/16: Adequate for discharge per MD.  Attendees: Physician:  Merlyn Albert, MD 1/16/201711:22 AM  Nursing:   Terressa Koyanagi, RN 1/16/201711:22 AM  Other:  Carmell Austria, LCSWA 1/16/201711:22 AM  Other:  Alphonse Guild. Kerney Hopfensperger LCSWA 1/16/201711:22 AM  Other:   1/16/201711:22 AM  Other:  1/16/201711:22 AM  Other:  1/16/201711:22 AM  Other:  1/16/201711:22 AM  Other:  1/16/201711:22 AM  Other:  1/16/201711:22 AM  Other:  1/16/201711:22 AM  Other:   1/16/201711:22 AM   Scribe for Treatment Team:   Claudine Mouton, MSW, Latanya Presser [phone]  04/16/2015, 11:22 AM

## 2015-04-16 NOTE — BHH Group Notes (Signed)
[facility] Group Notes:  (Nursing/MHT/Case Management/Adjunct)  Date:  04/16/2015  Time:  1:06 PM  Type of Therapy:  Group Therapy  Participation Level:  Minimal  Participation Quality:  Appropriate  Affect:  Appropriate  Cognitive:  Appropriate  Insight:  Improving  Engagement in Group:  Improving  Modes of Intervention:  Activity  Summary of Progress/Problems:  Sharon Woods 04/16/2015, 1:06 PM

## 2015-04-16 NOTE — Progress Notes (Signed)
Patient denies SI/HI, denies A/V hallucinations. Patient verbalizes understanding of discharge instructions, follow up care and prescriptions. Patient given all belongings from  locker. Patient escorted out by staff, transported by family. 

## 2015-04-16 NOTE — Progress Notes (Signed)
  [facility] Adult Case Management Discharge Plan :  Will you be returning to the same living situation after discharge:  No,  pt will be returning home to live with her mother At discharge, do you have transportation home?: Yes,  pt will be picked up by family Do you have the ability to pay for your medications: Yes,  pt will be provided with prescriptions at discharge  Release of information consent forms completed and in the chart;  Patient's signature needed at discharge.  Patient to Follow up at: Follow-up Information    Follow up with [facility].   Why:  Please bring your application for financial and planning assistance for medications, to the walk-in clinic Mon thru Fri from 9-5 pm. Please call for assistance with the application and/or bring it in for a future appointment for medications management   Contact information:   [address], [place], [place] [address] Ph: [phone] Fax: [phone]      Follow up with [facility].   Why:  Please call [facility] for an appointment to have an ACT Team member to visit your home for an assessment for medication managment and therapy.   Contact information:   [place], [place] [address] Ph: [phone] Fax: [phone]      Next level of care provider has access to [facility] and Suicide Prevention discussed: Yes,  completed with pt  Have you used any form of tobacco in the last 30 days? (Cigarettes, Smokeless Tobacco, Cigars, and/or Pipes): No  Has patient been referred to the Quitline?: N/A patient is not a smoker  Patient has been referred for addiction treatment: N/A  Claudine Mouton 04/16/2015, 11:34 AM

## 2015-04-16 NOTE — BHH Suicide Risk Assessment (Signed)
[facility] Discharge Suicide Risk Assessment   Demographic Factors:  Caucasian, Low socioeconomic status and Unemployed  Total Time spent with patient: 30 minutes    Psychiatric Specialty Exam: Physical Exam  ROS                                                         Have you used any form of tobacco in the last 30 days? (Cigarettes, Smokeless Tobacco, Cigars, and/or Pipes): No  Has this patient used any form of tobacco in the last 30 days? (Cigarettes, Smokeless Tobacco, Cigars, and/or Pipes) No  Mental Status Per Nursing Assessment::   On Admission:     Current Mental Status by Physician: no longer catatonic,no evidence of  thought blocking. No psychosis.  Loss Factors: Decline in physical health and Financial problems/change in socioeconomic status  Historical Factors: NA  Risk Reduction Factors:   Sense of responsibility to family, Living with another person, especially a relative and Positive social support  Continued Clinical Symptoms:  Previous Psychiatric Diagnoses and Treatments  Cognitive Features That Contribute To Risk:  Closed-mindedness    Suicide Risk:  Minimal: No identifiable suicidal ideation.  Patients presenting with no risk factors but with morbid ruminations; may be classified as minimal risk based on the severity of the depressive symptoms  Principal Problem: Severe major depression, single episode, with psychotic features, mood-congruent [facility]) Discharge Diagnoses:  Patient Active Problem List   Diagnosis Date Noted  . Severe major depression, single episode, with psychotic features, mood-congruent ([place]) [F32.3] 03/29/2015  . Protein-calorie malnutrition, severe [E43] 03/26/2015  . 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

## 2015-05-22 ENCOUNTER — Ambulatory Visit: Payer: Self-pay

## 2015-06-06 ENCOUNTER — Ambulatory Visit: Payer: Self-pay

## 2015-06-06 ENCOUNTER — Encounter (INDEPENDENT_AMBULATORY_CARE_PROVIDER_SITE_OTHER): Payer: Self-pay

## 2015-06-28 ENCOUNTER — Telehealth: Payer: Self-pay

## 2015-06-28 NOTE — Telephone Encounter (Signed)
Pt called lm , wants to sched appt did not leave reason call back # [phone]

## 2015-06-28 NOTE — Telephone Encounter (Signed)
Called pt. Wanted application. Went through eligibility. Pt expressed that she did not have photo id and I told her we would need her to at least bring the receipt from the [facility] saying she's purchased a photo id before we could allow her to been seen. Otherwise pt believes she can meet eligibility requirements.

## 2015-08-27 ENCOUNTER — Emergency Department
Admission: EM | Admit: 2015-08-27 | Discharge: 2015-08-28 | Disposition: A | Discharge status: Home / Self Care | Payer: Self-pay | Attending: Emergency Medicine | Admitting: Emergency Medicine

## 2015-08-27 ENCOUNTER — Encounter: Payer: Self-pay | Admitting: Emergency Medicine

## 2015-08-27 DIAGNOSIS — Y92480 Sidewalk as the place of occurrence of the external cause: Secondary | ICD-10-CM | POA: Insufficient documentation

## 2015-08-27 DIAGNOSIS — S0083XA Contusion of other part of head, initial encounter: Secondary | ICD-10-CM | POA: Insufficient documentation

## 2015-08-27 DIAGNOSIS — W19XXXA Unspecified fall, initial encounter: Secondary | ICD-10-CM

## 2015-08-27 DIAGNOSIS — Y999 Unspecified external cause status: Secondary | ICD-10-CM | POA: Insufficient documentation

## 2015-08-27 DIAGNOSIS — Y9301 Activity, walking, marching and hiking: Secondary | ICD-10-CM | POA: Insufficient documentation

## 2015-08-27 DIAGNOSIS — W108XXA Fall (on) (from) other stairs and steps, initial encounter: Secondary | ICD-10-CM | POA: Insufficient documentation

## 2015-08-27 DIAGNOSIS — S40211A Abrasion of right shoulder, initial encounter: Secondary | ICD-10-CM | POA: Insufficient documentation

## 2015-08-27 DIAGNOSIS — F333 Major depressive disorder, recurrent, severe with psychotic symptoms: Secondary | ICD-10-CM | POA: Insufficient documentation

## 2015-08-27 DIAGNOSIS — Z79899 Other long term (current) drug therapy: Secondary | ICD-10-CM | POA: Insufficient documentation

## 2015-08-27 DIAGNOSIS — Z87891 Personal history of nicotine dependence: Secondary | ICD-10-CM | POA: Insufficient documentation

## 2015-08-27 DIAGNOSIS — T148XXA Other injury of unspecified body region, initial encounter: Secondary | ICD-10-CM

## 2015-08-27 NOTE — ED Notes (Signed)
Pt presents to ED with her husband to be evaluated for fall from tripping, face down on side walk. Facial bruise/abrasions/swelling at right face. Pt denies LOC. Pt denies taking blood thinner. Pt not sure if Tdap updated.

## 2015-08-28 ENCOUNTER — Emergency Department: Payer: Self-pay

## 2015-08-28 NOTE — ED Provider Notes (Signed)
[facility] Emergency Department Provider Note   ____________________________________________  Time seen: Approximately 0012 AM  I have reviewed the triage vital signs and the nursing notes.   HISTORY  Chief Complaint Fall    HPI Sharon Woods is a 55 y.o. female who comes into the hospital today with facial contusion. The patient reports that she was walking and didn't see steps on the sidewalk. She reports that she fell forward and hit her face on the sidewalk. She reports that the swelling seems to have worsened since it occurred. The incident occurred around noon or 12:30. She reports that she went home and placed ice on it. It is sore but not severe. The patient rates the pain a 4-5 out of 10 in intensity. She doesn't think that she passed out as her husband was right there and picked her right up. She reports that as he picked her up and took her across the street to sit down. The patient denies any vomiting, dizziness, lightheadedness. The patient reports that since the area has continued to swell she decided to come into the hospital. The patient did not take any other medications. She is here for evaluation.   Past Medical History  Diagnosis Date  . Patient denies medical problems     Patient Active Problem List   Diagnosis Date Noted  . Severe major depression, single episode, with psychotic features, mood-congruent ([place]) 03/29/2015  . Protein-calorie malnutrition, severe 03/26/2015  . 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 No past surgeries      Current Outpatient Rx  Name  Route  Sig  Dispense  Refill  . mirtazapine (REMERON) 30 MG tablet   Oral   Take 1 tablet (30 mg total) by mouth at bedtime.   7 tablet   0   . OLANZapine (ZYPREXA) 15 MG tablet   Oral   Take 2 tablets (30 mg total) by mouth at bedtime.   14 tablet   0     Allergies Review of patient's allergies indicates no  known allergies.  Family History  Problem Relation Age of Onset  . Cancer    . Dementia      Social History Social History  Substance Use Topics  . Smoking status: Former Smoker    Types: Cigarettes  . Smokeless tobacco: None  . Alcohol Use: No    Review of Systems Constitutional: No fever/chills Eyes: No visual changes. ENT: Swelling to right eye. Cardiovascular: Denies chest pain. Respiratory: Denies shortness of breath. Gastrointestinal: No abdominal pain.  No nausea, no vomiting.  No diarrhea.  No constipation. Genitourinary: Negative for dysuria. Musculoskeletal: Negative for back pain. Skin: Negative for rash. Neurological: Negative for headaches, focal weakness or numbness.  10-point ROS otherwise negative.  ____________________________________________   PHYSICAL EXAM:  VITAL SIGNS: ED Triage Vitals  Enc Vitals Group     BP 08/27/15 2231 159/97 mmHg     Pulse Rate 08/27/15 2231 84     Resp 08/27/15 2231 16     Temp 08/27/15 2231 98.6 F (37 C)     Temp Source 08/27/15 2231 Oral     SpO2 08/27/15 2231 98 %     Weight 08/27/15 2231 147 lb (66.679 kg)     Height 08/27/15 2231 5\' 1"  (1.549 m)     Head Cir --      Peak Flow --      Pain Score 08/27/15 2233 6  Pain Loc --      Pain Edu? --      Excl. in [place]? --     Constitutional: Alert and oriented. Well appearing and inModerate distress. Eyes: Conjunctivae are normal. PERRL. EOMI. Head: Swelling and bruising to right eye Nose: No congestion/rhinnorhea. Neck: No cervical spine tenderness to palpation Mouth/Throat: Mucous membranes are moist.  Oropharynx non-erythematous. Cardiovascular: Normal rate, regular rhythm. Grossly normal heart sounds.  Good peripheral circulation. Respiratory: Normal respiratory effort.  No retractions. Lungs CTAB. Gastrointestinal: Soft and nontender. No distention. Positive bowel sounds Musculoskeletal: No lower extremity tenderness nor edema.   Neurologic:  Normal  speech and language. Cranial nerves II through XII are grossly intact with no focal motor or neuro deficits Skin:  Skin is warm, dry abrasion to right shoulder Psychiatric: Mood and affect are normal.   ____________________________________________   LABS (all labs ordered are listed, but only abnormal results are displayed)  Labs Reviewed - No data to display ____________________________________________  EKG  None ____________________________________________  RADIOLOGY  CT head and maxillofacial: No evidence of traumatic intracranial injury or fracture, no evidence of fracture or dislocation with regard to the maxillofacial structures, soft tissue swelling surrounding the right orbit, multiple large maxillary and mandibular dental caries noted with mild loosening about the root of the right second mandibular premolar. ____________________________________________   PROCEDURES  Procedure(s) performed: None  Critical Care performed: No  ____________________________________________   INITIAL IMPRESSION / ASSESSMENT AND PLAN / ED COURSE  Pertinent labs & imaging results that were available during my care of the patient were reviewed by me and considered in my medical decision making (see chart for details).  This is a 55 year old female who comes into the hospital today after falling outside and hitting her face. The patient has had some continued swelling throughout the day since the incident. The patient did have a CT scan which was negative and did not show any acute injury. The patient reports that her pain is comfortable 4-5 out of 10. The patient will be discharged to home to continue to ice her face and take ibuprofen and Tylenol as needed. The patient will be discharged home. ____________________________________________   FINAL CLINICAL IMPRESSION(S) / ED DIAGNOSES  Final diagnoses:  Fall, initial encounter  Facial contusion, initial encounter  Abrasion      NEW  MEDICATIONS STARTED DURING THIS VISIT:  Discharge Medication List as of 08/28/2015  2:29 AM       Note:  This document was prepared using Dragon voice recognition software and may include unintentional dictation errors.    Loney Hering, MD 08/28/15 [phone]

## 2015-08-28 NOTE — ED Notes (Signed)
Pt's family requested coffee, provided.

## 2015-08-28 NOTE — Discharge Instructions (Signed)
Facial or Scalp Contusion °A facial or scalp contusion is a deep bruise on the face or head. Injuries to the face and head generally cause a lot of swelling, especially around the eyes. Contusions are the result of an injury that caused bleeding under the skin. The contusion may turn blue, purple, or yellow. Minor injuries will give you a painless contusion, but more severe contusions may stay painful and swollen for a few weeks.  °CAUSES  °A facial or scalp contusion is caused by a blunt injury or trauma to the face or head area.  °SIGNS AND SYMPTOMS  °· Swelling of the injured area.   °· Discoloration of the injured area.   °· Tenderness, soreness, or pain in the injured area.   °DIAGNOSIS  °The diagnosis can be made by taking a medical history and doing a physical exam. An X-ray exam, CT scan, or MRI may be needed to determine if there are any associated injuries, such as broken bones (fractures). °TREATMENT  °Often, the best treatment for a facial or scalp contusion is applying cold compresses to the injured area. Over-the-counter medicines may also be recommended for pain control.  °HOME CARE INSTRUCTIONS  °· Only take over-the-counter or prescription medicines as directed by your health care provider.   °· Apply ice to the injured area.   °· Put ice in a plastic bag.   °· Place a towel between your skin and the bag.   °· Leave the ice on for 20 minutes, 2-3 times a day.   °SEEK MEDICAL CARE IF: °· You have bite problems.   °· You have pain with chewing.   °· You are concerned about facial defects. °SEEK IMMEDIATE MEDICAL CARE IF: °· You have severe pain or a headache that is not relieved by medicine.   °· You have unusual sleepiness, confusion, or personality changes.   °· You throw up (vomit).   °· You have a persistent nosebleed.   °· You have double vision or blurred vision.   °· You have fluid drainage from your nose or ear.   °· You have difficulty walking or using your arms or legs.   °MAKE SURE YOU:   °· Understand these instructions. °· Will watch your condition. °· Will get help right away if you are not doing well or get worse. °  °This information is not intended to replace advice given to you by your health care provider. Make sure you discuss any questions you have with your health care provider. °  °Document Released: 04/24/2004 Document Revised: 04/07/2014 Document Reviewed: 10/28/2012 °Elsevier Interactive Patient Education ©2016 [employer] ° °Head Injury, Adult °You have a head injury. Headaches and throwing up (vomiting) are common after a head injury. It should be easy to wake up from sleeping. Sometimes you must stay in the hospital. Most problems happen within the first 24 hours. Side effects may occur up to 7-10 days after the injury.  °WHAT ARE THE TYPES OF HEAD INJURIES? °Head injuries can be as minor as a bump. Some head injuries can be more severe. More severe head injuries include: °· A jarring injury to the brain (concussion). °· A bruise of the brain (contusion). This mean there is bleeding in the brain that can cause swelling. °· A cracked skull (skull fracture). °· Bleeding in the brain that collects, clots, and forms a bump (hematoma). °WHEN SHOULD I GET HELP RIGHT AWAY?  °· You are confused or sleepy. °· You cannot be woken up. °· You feel sick to your stomach (nauseous) or keep throwing up (vomiting). °· Your dizziness or   unsteadiness is getting worse. °· You have very bad, lasting headaches that are not helped by medicine. Take medicines only as told by your doctor. °· You cannot use your arms or legs like normal. °· You cannot walk. °· You notice changes in the black spots in the center of the colored part of your eye (pupil). °· You have clear or bloody fluid coming from your nose or ears. °· You have trouble seeing. °During the next 24 hours after the injury, you must stay with someone who can watch you. This person should get help right away (call 911 in the [place] if you start  to shake and are not able to control it (have seizures), you pass out, or you are unable to wake up. °HOW CAN I PREVENT A HEAD INJURY IN THE FUTURE? °· Wear seat belts. °· Wear a helmet while bike riding and playing sports like football. °· Stay away from dangerous activities around the house. °WHEN CAN I RETURN TO NORMAL ACTIVITIES AND ATHLETICS? °See your doctor before doing these activities. You should not do normal activities or play contact sports until 1 week after the following symptoms have stopped: °· Headache that does not go away. °· Dizziness. °· Poor attention. °· Confusion. °· Memory problems. °· Sickness to your stomach or throwing up. °· Tiredness. °· Fussiness. °· Bothered by bright lights or loud noises. °· Anxiousness or depression. °· Restless sleep. °MAKE SURE YOU:  °· Understand these instructions. °· Will watch your condition. °· Will get help right away if you are not doing well or get worse. °  °This information is not intended to replace advice given to you by your health care provider. Make sure you discuss any questions you have with your health care provider. °  °Document Released: 02/28/2008 Document Revised: 04/07/2014 Document Reviewed: 11/22/2012 °Elsevier Interactive Patient Education ©2016 Elsevier Inc. ° °

## 2015-11-15 ENCOUNTER — Emergency Department: Payer: Self-pay

## 2015-11-15 ENCOUNTER — Emergency Department
Admission: EM | Admit: 2015-11-15 | Discharge: 2015-11-15 | Disposition: A | Discharge status: Home / Self Care | Payer: Self-pay | Attending: Student in an Organized Health Care Education/Training Program | Admitting: Student in an Organized Health Care Education/Training Program

## 2015-11-15 DIAGNOSIS — F329 Major depressive disorder, single episode, unspecified: Secondary | ICD-10-CM | POA: Insufficient documentation

## 2015-11-15 DIAGNOSIS — F333 Major depressive disorder, recurrent, severe with psychotic symptoms: Secondary | ICD-10-CM

## 2015-11-15 DIAGNOSIS — R4584 Anhedonia: Secondary | ICD-10-CM | POA: Insufficient documentation

## 2015-11-15 DIAGNOSIS — F32A Depression, unspecified: Secondary | ICD-10-CM

## 2015-11-15 DIAGNOSIS — I10 Essential (primary) hypertension: Secondary | ICD-10-CM

## 2015-11-15 DIAGNOSIS — Z9119 Patient's noncompliance with other medical treatment and regimen: Secondary | ICD-10-CM

## 2015-11-15 DIAGNOSIS — Z5181 Encounter for therapeutic drug level monitoring: Secondary | ICD-10-CM | POA: Insufficient documentation

## 2015-11-15 DIAGNOSIS — Z87891 Personal history of nicotine dependence: Secondary | ICD-10-CM | POA: Insufficient documentation

## 2015-11-15 DIAGNOSIS — Z91199 Patient's noncompliance with other medical treatment and regimen due to unspecified reason: Secondary | ICD-10-CM

## 2015-11-15 HISTORY — DX: Essential (primary) hypertension: I10

## 2015-11-15 LAB — URINE DRUG SCREEN, QUALITATIVE (ARMC ONLY)
AMPHETAMINES, UR SCREEN: NOT DETECTED
Barbiturates, Ur Screen: NOT DETECTED
Benzodiazepine, Ur Scrn: NOT DETECTED
COCAINE METABOLITE, UR ~~LOC~~: NOT DETECTED
Cannabinoid 50 Ng, Ur ~~LOC~~: NOT DETECTED
MDMA (ECSTASY) UR SCREEN: NOT DETECTED
METHADONE SCREEN, URINE: NOT DETECTED
Opiate, Ur Screen: NOT DETECTED
Phencyclidine (PCP) Ur S: NOT DETECTED
TRICYCLIC, UR SCREEN: NOT DETECTED

## 2015-11-15 LAB — URINALYSIS COMPLETE WITH MICROSCOPIC (ARMC ONLY)
Bilirubin Urine: NEGATIVE
GLUCOSE, UA: NEGATIVE mg/dL
HGB URINE DIPSTICK: NEGATIVE
Ketones, ur: NEGATIVE mg/dL
LEUKOCYTES UA: NEGATIVE
NITRITE: NEGATIVE
PH: 9 — AB (ref 5.0–8.0)
PROTEIN: NEGATIVE mg/dL
SPECIFIC GRAVITY, URINE: 1.005 (ref 1.005–1.030)

## 2015-11-15 LAB — SALICYLATE LEVEL

## 2015-11-15 LAB — COMPREHENSIVE METABOLIC PANEL
ALT: 20 U/L (ref 14–54)
AST: 29 U/L (ref 15–41)
Albumin: 4.7 g/dL (ref 3.5–5.0)
Alkaline Phosphatase: 67 U/L (ref 38–126)
Anion gap: 12 (ref 5–15)
BUN: 20 mg/dL (ref 6–20)
CHLORIDE: 105 mmol/L (ref 101–111)
CO2: 25 mmol/L (ref 22–32)
CREATININE: 0.85 mg/dL (ref 0.44–1.00)
Calcium: 9.9 mg/dL (ref 8.9–10.3)
Glucose, Bld: 140 mg/dL — ABNORMAL HIGH (ref 65–99)
POTASSIUM: 3.6 mmol/L (ref 3.5–5.1)
SODIUM: 142 mmol/L (ref 135–145)
Total Bilirubin: 0.7 mg/dL (ref 0.3–1.2)
Total Protein: 8 g/dL (ref 6.5–8.1)

## 2015-11-15 LAB — CBC
HCT: 45.5 % (ref 35.0–47.0)
HEMOGLOBIN: 15.9 g/dL (ref 12.0–16.0)
MCH: 30.8 pg (ref 26.0–34.0)
MCHC: 34.9 g/dL (ref 32.0–36.0)
MCV: 88.4 fL (ref 80.0–100.0)
PLATELETS: 207 10*3/uL (ref 150–440)
RBC: 5.15 MIL/uL (ref 3.80–5.20)
RDW: 13.7 % (ref 11.5–14.5)
WBC: 8.7 10*3/uL (ref 3.6–11.0)

## 2015-11-15 LAB — ETHANOL

## 2015-11-15 LAB — ACETAMINOPHEN LEVEL: Acetaminophen (Tylenol), Serum: <10 ug/mL — ABNORMAL LOW (ref 10–30)

## 2015-11-15 MED ORDER — SODIUM CHLORIDE 0.9 % IV BOLUS (SEPSIS)
1000.0000 mL | Freq: Once | INTRAVENOUS | Status: AC
  Administered 2015-11-15: 1000 mL via INTRAVENOUS

## 2015-11-15 MED ORDER — MIRTAZAPINE 30 MG PO TABS: 30.0000 mg | ORAL_TABLET | Freq: Every day | ORAL | 1 refills | Status: DC

## 2015-11-15 MED ORDER — OLANZAPINE 15 MG PO TABS: 30.0000 mg | ORAL_TABLET | Freq: Every day | ORAL | 1 refills | Status: DC

## 2015-11-15 NOTE — Consult Note (Signed)
[facility] Consult   Reason for Consult:  Consult for 55 year old woman with a history of major depression with psychotic features Referring Physician:  Joni Fears Patient Identification: Sharon Woods MRN:  [id number] Principal Diagnosis: Severe recurrent major depression with psychotic features [facility]) Diagnosis:   Patient Active Problem List   Diagnosis Date Noted  . Severe recurrent major depression with psychotic features ([place]) [F33.3] 11/15/2015  . Hypertension [I10] 11/15/2015  . Noncompliance [Z91.19] 11/15/2015  . Severe major depression, single episode, with psychotic features, mood-congruent ([place]) [F32.3] 03/29/2015  . Protein-calorie malnutrition, severe [E43] 03/26/2015  . 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 female patient admitted with patient did not offer any information.  HPI:  Patient interviewed but most of the history comes from her husband who is here with her. All chart reviewed. Labs and vitals reviewed and case discussed with emergency room doctor and TTS. 55 year old woman brought in by her husband with reports that over the past month she has been progressively withdrawing and is refusing to eat and rarely drinking any fluids. Husband reports that she had been living with other family members up until July. At that time her mother was placed in a hospice or nursing home and the husband came back into her life picking her up and taking her to live with him. He says when he first picked her up she was eating drinking and walking and talking but she has been progressively withdrawing over the last month. Now she is almost never eating any food. Drinks very little. He talks a little bit but not in any great detail and has become too weak to walk. I attempted to interview the patient today. She wouldn't answer any direct questions except to tell me her first name and the name  of her husband. Wouldn't answer questions about why she was not usually lower drinking. She did shake her head that she was not having any hallucinations. Did not give any indication that she was actually attempting to harm herself and husband reports she has not done anything active to try to harm herself. Husband reports that she's probably been out of her medicine at least since April. She was supposed to follow-up with outpatient mental health and stay on her medication after her hospitalization here in January. She did not follow-up and family did not make sure she continued to have prescriptions of she's probably been off her psychiatric medicines for several months. Not using alcohol or any drugs.  Social history: Patient and her husband appear to be impoverished are very close to it. They're living together right now. Husband indicates that he is happy to take responsibility for taking care of her.  Medical history: High blood pressure. She was malnourished to the point of needing hospital treatment back at the beginning of the year.  Substance abuse history: No history of alcohol or drug abuse  Past Psychiatric History: Patient had an extended hospitalization here in January for major depression with psychotic features. She presented at that time essentially the same as how she is presenting now. She gradually got better with Zyprexa and Remeron. Apparently continue the medicine for a couple months. No history of actual suicide attempts in the past.  Risk to Self: Is patient at risk for suicide?: No Risk to Others:   Prior Inpatient Therapy:   Prior Outpatient Therapy:    Past Medical History:  Past Medical History:  Diagnosis Date  . Hypertension   . Patient denies medical problems     Past Surgical History:  Procedure Laterality Date  . ABDOMINAL HYSTERECTOMY    . CESAREAN SECTION     x2  . NO PAST SURGERIES     Family History:  Family History  Problem Relation Age of Onset   . Cancer    . Dementia     Family Psychiatric  History: No known family history of mental illness Social History:  History  Alcohol Use No     History  Drug Use No    Social History   Social History  . Marital status: Married    Spouse name: N/A  . Number of children: N/A  . Years of education: N/A   Social History Main Topics  . Smoking status: Former Smoker    Types: Cigarettes  . Smokeless tobacco: Never Used  . Alcohol use No  . Drug use: No  . Sexual activity: Not Asked     Comment: unable to assess    Other Topics Concern  . None   Social History Narrative  . None   Additional Social History:    Allergies:  No Known Allergies  Labs:  Results for orders placed or performed during the hospital encounter of 11/15/15 (from the past 48 hour(s))  Comprehensive metabolic panel     Status: Abnormal   Collection Time: 11/15/15  2:41 PM  Result Value Ref Range   Sodium 142 135 - 145 mmol/L   Potassium 3.6 3.5 - 5.1 mmol/L   Chloride 105 101 - 111 mmol/L   CO2 25 22 - 32 mmol/L   Glucose, Bld 140 (H) 65 - 99 mg/dL   BUN 20 6 - 20 mg/dL   Creatinine, Ser 0.85 0.44 - 1.00 mg/dL   Calcium 9.9 8.9 - 10.3 mg/dL   Total Protein 8.0 6.5 - 8.1 g/dL   Albumin 4.7 3.5 - 5.0 g/dL   AST 29 15 - 41 U/L   ALT 20 14 - 54 U/L   Alkaline Phosphatase 67 38 - 126 U/L   Total Bilirubin 0.7 0.3 - 1.2 mg/dL   GFR calc non Af Amer >60 >60 mL/min   GFR calc Af Amer >60 >60 mL/min    Comment: (NOTE) The eGFR has been calculated using the CKD EPI equation. This calculation has not been validated in all clinical situations. eGFR's persistently <60 mL/min signify possible Chronic Kidney Disease.    Anion gap 12 5 - 15  Ethanol     Status: None   Collection Time: 11/15/15  2:41 PM  Result Value Ref Range   Alcohol, Ethyl (B) <5 <5 mg/dL    Comment:        LOWEST DETECTABLE LIMIT FOR SERUM ALCOHOL IS 5 mg/dL FOR MEDICAL PURPOSES ONLY   Salicylate level     Status: None    Collection Time: 11/15/15  2:41 PM  Result Value Ref Range   Salicylate Lvl <0.0 2.8 - 30.0 mg/dL  Acetaminophen level     Status: Abnormal   Collection Time: 11/15/15  2:41 PM  Result Value Ref Range   Acetaminophen (Tylenol), Serum <10 (L) 10 - 30 ug/mL    Comment:        THERAPEUTIC CONCENTRATIONS VARY SIGNIFICANTLY. A RANGE OF 10-30 ug/mL MAY BE AN EFFECTIVE CONCENTRATION FOR MANY PATIENTS. HOWEVER, SOME ARE BEST TREATED AT CONCENTRATIONS OUTSIDE THIS RANGE. ACETAMINOPHEN CONCENTRATIONS >150 ug/mL AT 4 HOURS AFTER  INGESTION AND >50 ug/mL AT 12 HOURS AFTER INGESTION ARE OFTEN ASSOCIATED WITH TOXIC REACTIONS.   cbc     Status: None   Collection Time: 11/15/15  2:41 PM  Result Value Ref Range   WBC 8.7 3.6 - 11.0 K/uL   RBC 5.15 3.80 - 5.20 MIL/uL   Hemoglobin 15.9 12.0 - 16.0 g/dL   HCT 45.5 35.0 - 47.0 %   MCV 88.4 80.0 - 100.0 fL   MCH 30.8 26.0 - 34.0 pg   MCHC 34.9 32.0 - 36.0 g/dL   RDW 13.7 11.5 - 14.5 %   Platelets 207 150 - 440 K/uL    No current facility-administered medications for this encounter.    Current Outpatient Prescriptions  Medication Sig Dispense Refill  . mirtazapine (REMERON) 30 MG tablet Take 1 tablet (30 mg total) by mouth at bedtime. 30 tablet 1  . OLANZapine (ZYPREXA) 15 MG tablet Take 2 tablets (30 mg total) by mouth at bedtime. 60 tablet 1    Musculoskeletal: Strength & Muscle Tone: decreased Gait & Station: unable to stand Patient leans: N/A  Psychiatric Specialty Exam: Physical Exam  Nursing note and vitals reviewed. Constitutional: She appears well-developed.  HENT:  Head: Normocephalic and atraumatic.  Eyes: Conjunctivae are normal. Pupils are equal, round, and reactive to light.  Neck: Normal range of motion.  Cardiovascular: Normal heart sounds.   Respiratory: Effort normal. No respiratory distress.  GI: Soft.  Musculoskeletal: Normal range of motion.  Neurological: She is alert.  Patient is either unwilling or unable  to walk due to weakness.  Skin: Skin is warm and dry.  Psychiatric: Her affect is blunt. She is withdrawn. Cognition and memory are impaired. She is noncommunicative.  Patient presents as just short of catatonic. She spoke a couple of words but wouldn't answer any questions in depth. Affect is flat. She is awake and alert but mostly just shakes her head or shrugs or glances at her husband. Husband reports she is too weak to walk recently.    ROS  Blood pressure (!) 161/87, pulse 86, temperature 98.7 F (37.1 C), temperature source Oral, resp. rate 15, height 5' 3"  (1.6 m), weight 63.5 kg (140 lb), SpO2 100 %.Body mass index is 24.8 kg/m.  General Appearance: Disheveled  Eye Contact:  Minimal  Speech:  Nonexistent  Volume:  Decreased  Mood:  Negative  Affect:  Blunt  Thought Process:  NA  Orientation:  NA  Thought Content:  NA  Suicidal Thoughts:  No  Homicidal Thoughts:  No  Memory:  Negative  Judgement:  Impaired  Insight:  Lacking  Psychomotor Activity:  Decreased  Concentration:  Concentration: Poor  Recall:  Poor  Fund of Knowledge:  Poor  Language:  Poor  Akathisia:  No  Handed:  Right  AIMS (if indicated):     Assets:  Housing Intimacy Physical Health Social Support  ADL's:  Impaired  Cognition:  Impaired,  Moderate and Severe  Sleep:        Treatment Plan Summary: Medication management and Plan 55 year old woman with psychotic depression severe. Just short of catatonic. Patient however is medically stable. Her labs are okay and her blood pressure is just slightly elevated. There doesn't appear to be an indication for medical hospitalization. Because of her difficulty ambulating she would be a high falls risk and is not likely to be appropriate to be admitted to the psychiatric ward. Fortunately the husband expresses willingness to give her her medication. I explained that if she  got back on her medicine as she was taking it earlier in the year she would likely  improve. She needs to follow-up with [facility]. She needs to stay on the medicine. Patient and husband educated that if she does not get on and stay on the medicine this is likely to get worse until she actually does get sick enough to need hospitalization. Case reviewed with emergency room doctor. Discontinue IVC. Prescriptions for 30 mg of olanzapine and 30 mg of mirtazapine for a month with 1 refill.  Disposition: Supportive therapy provided about ongoing stressors. Discussed crisis plan, support from social network, calling 911, coming to the Emergency Department, and calling Suicide Hotline.  Alethia Berthold, MD 11/15/2015 5:48 PM

## 2015-11-15 NOTE — ED Provider Notes (Signed)
[facility] Emergency Department Provider Note    First MD Initiated Contact with Patient 11/15/15 1503     (approximate)  I have reviewed the triage vital signs and the nursing notes.   HISTORY  Chief Complaint Depression    HPI Sharon Woods is a 55 y.o. female with history of depression presents with severe depressive symptoms and anhedonia. Patient brought in by patient's husband who provides most of the history as the patient is choosing not to speak with his provider. She states her head when asked if she had any homicidal or suicidal ideations but the patient's husband reports that she has not in several weeks and he has to force feed her. Patient has been off of her meds since April. She just recently moved back in with her husband. Denies any trauma, no fevers, no shortness of breath or chest pain. No nausea or vomiting   Past Medical History:  Diagnosis Date  . Hypertension   . Patient denies medical problems     Patient Active Problem List   Diagnosis Date Noted  . Severe major depression, single episode, with psychotic features, mood-congruent ([place]) 03/29/2015  . Protein-calorie malnutrition, severe 03/26/2015  . 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 ABDOMINAL HYSTERECTOMY    . CESAREAN SECTION     x2  . NO PAST SURGERIES      Prior to Admission medications   Medication Sig Start Date End Date Taking? Authorizing Provider  mirtazapine (REMERON) 30 MG tablet Take 1 tablet (30 mg total) by mouth at bedtime. 04/13/15   Hildred Priest, MD  OLANZapine (ZYPREXA) 15 MG tablet Take 2 tablets (30 mg total) by mouth at bedtime. 04/13/15   Hildred Priest, MD    Allergies Review of patient's allergies indicates no known allergies.  Family History  Problem Relation Age of Onset  . Cancer    . Dementia      Social History Social History  Substance Use Topics  . Smoking  status: Former Smoker    Types: Cigarettes  . Smokeless tobacco: Never Used  . Alcohol use No    Review of Systems Unable to obtain 2/2 mental illness ____________________________________________   PHYSICAL EXAM:  VITAL SIGNS: Vitals:   11/15/15 1433 11/15/15 1534  BP: (!) 135/102 (!) 161/87  Pulse: (!) 123 86  Resp: 18 15  Temp: 98.7 F (37.1 C)     Constitutional: Alert and oriented. Disheveled Eyes: Conjunctivae are normal. PERRL. EOMI. Head: Atraumatic. Nose: No congestion/rhinnorhea. Mouth/Throat: Mucous membranes are moist.  Oropharynx non-erythematous. Neck: No stridor. Painless ROM. No cervical spine tenderness to palpation Hematological/Lymphatic/Immunilogical: No cervical lymphadenopathy. Cardiovascular: Normal rate, regular rhythm. Grossly normal heart sounds.  Good peripheral circulation. Respiratory: Normal respiratory effort.  No retractions. Lungs CTAB. Gastrointestinal: Soft and nontender. No distention. No abdominal bruits. No CVA tenderness. Genitourinary:  Musculoskeletal: No lower extremity tenderness nor edema.  No joint effusions. Neurologic:  Normal speech and language. No gross focal neurologic deficits are appreciated. No gait instability. Skin:  Skin is warm, dry and intact. No rash noted. Psychiatric: patient choosing not to communicate, appears withdrawn ____________________________________________   LABS (all labs ordered are listed, but only abnormal results are displayed)  Results for orders placed or performed during the hospital encounter of 11/15/15 (from the past 24 hour(s))  Comprehensive metabolic panel     Status: Abnormal   Collection Time: 11/15/15  2:41 PM  Result Value Ref Range  Sodium 142 135 - 145 mmol/L   Potassium 3.6 3.5 - 5.1 mmol/L   Chloride 105 101 - 111 mmol/L   CO2 25 22 - 32 mmol/L   Glucose, Bld 140 (H) 65 - 99 mg/dL   BUN 20 6 - 20 mg/dL   Creatinine, Ser 0.85 0.44 - 1.00 mg/dL   Calcium 9.9 8.9 - 10.3  mg/dL   Total Protein 8.0 6.5 - 8.1 g/dL   Albumin 4.7 3.5 - 5.0 g/dL   AST 29 15 - 41 U/L   ALT 20 14 - 54 U/L   Alkaline Phosphatase 67 38 - 126 U/L   Total Bilirubin 0.7 0.3 - 1.2 mg/dL   GFR calc non Af Amer >60 >60 mL/min   GFR calc Af Amer >60 >60 mL/min   Anion gap 12 5 - 15  Ethanol     Status: None   Collection Time: 11/15/15  2:41 PM  Result Value Ref Range   Alcohol, Ethyl (B) <5 <5 mg/dL  Salicylate level     Status: None   Collection Time: 11/15/15  2:41 PM  Result Value Ref Range   Salicylate Lvl 123456 2.8 - 30.0 mg/dL  Acetaminophen level     Status: Abnormal   Collection Time: 11/15/15  2:41 PM  Result Value Ref Range   Acetaminophen (Tylenol), Serum <10 (L) 10 - 30 ug/mL  cbc     Status: None   Collection Time: 11/15/15  2:41 PM  Result Value Ref Range   WBC 8.7 3.6 - 11.0 K/uL   RBC 5.15 3.80 - 5.20 MIL/uL   Hemoglobin 15.9 12.0 - 16.0 g/dL   HCT 45.5 35.0 - 47.0 %   MCV 88.4 80.0 - 100.0 fL   MCH 30.8 26.0 - 34.0 pg   MCHC 34.9 32.0 - 36.0 g/dL   RDW 13.7 11.5 - 14.5 %   Platelets 207 150 - 440 K/uL   ____________________________________________  EKG  ____________________________________________  RADIOLOGY  CXR with bronchitic changes, no infiltrate ____________________________________________   PROCEDURES  Procedure(s) performed: none    Critical Care performed: no ____________________________________________   INITIAL IMPRESSION / ASSESSMENT AND PLAN / ED COURSE  Pertinent labs & imaging results that were available during my care of the patient were reviewed by me and considered in my medical decision making (see chart for details).  DDX: Psychosis, delirium, medication effect, noncompliance, polysubstance abuse, Si, Hi, depression   THIANA ANIS is a 55 y.o. who presents to the ED with for evaluation of severe depression and severe anhedonia with poor PO intake and dehydration.  Patient has psych history of depression .   Laboratory testing was ordered to evaluation for underlying electrolyte derangement or signs of underlying organic pathology to explain today's presentation.  Based on history and physical and laboratory evaluation, it appears that the patient's presentation is 2/2 underlying psychiatric disorder and will require further evaluation and management by inpatient psychiatry.  Patient was  made an IVC due to danger to herself and inability .  Disposition pending psychiatric evaluation.   Clinical Course     ____________________________________________   FINAL CLINICAL IMPRESSION(S) / ED DIAGNOSES  Final diagnoses:  None      NEW MEDICATIONS STARTED DURING THIS VISIT:  New Prescriptions   No medications on file     Note:  This document was prepared using Dragon voice recognition software and may include unintentional dictation errors.    Merlyn Lot, MD 11/15/15 [phone]

## 2015-11-15 NOTE — ED Notes (Signed)
Patient transported to X-ray 

## 2015-11-15 NOTE — ED Triage Notes (Signed)
Pt is here with her husband with c/o being off her pysch meds since April, he states she is not wanting to eat anything or drink and feels like she may be dehydrated.. Pt denies SI/HI.Marland Kitchen

## 2015-11-15 NOTE — ED Provider Notes (Signed)
Discussed with Dr. Weber Cooks of psychiatry after his evaluation in the ED. He agrees the patient is depressed. She was previously on mirtazapine and Zyprexa and she agrees to take these medications again. She has good support in the home and her husband reports that he can ensure her safety and will ensure that she is compliant with medication. The patient denies any SI HI or elicitation or intent harm herself. Stable for outpatient follow-up, will follow-up with [facility].   Carrie Mew, MD 11/15/15 [phone]

## 2016-09-14 ENCOUNTER — Emergency Department
Admission: EM | Admit: 2016-09-14 | Discharge: 2016-09-14 | Disposition: A | Discharge status: Home / Self Care | Payer: Self-pay | Attending: Emergency Medicine | Admitting: Emergency Medicine

## 2016-09-14 ENCOUNTER — Encounter: Payer: Self-pay | Admitting: *Deleted

## 2016-09-14 ENCOUNTER — Emergency Department: Payer: Self-pay

## 2016-09-14 DIAGNOSIS — N760 Acute vaginitis: Secondary | ICD-10-CM | POA: Insufficient documentation

## 2016-09-14 DIAGNOSIS — N3000 Acute cystitis without hematuria: Secondary | ICD-10-CM | POA: Insufficient documentation

## 2016-09-14 DIAGNOSIS — Y92480 Sidewalk as the place of occurrence of the external cause: Secondary | ICD-10-CM | POA: Insufficient documentation

## 2016-09-14 DIAGNOSIS — Y999 Unspecified external cause status: Secondary | ICD-10-CM | POA: Insufficient documentation

## 2016-09-14 DIAGNOSIS — W010XXA Fall on same level from slipping, tripping and stumbling without subsequent striking against object, initial encounter: Secondary | ICD-10-CM | POA: Insufficient documentation

## 2016-09-14 DIAGNOSIS — Z87891 Personal history of nicotine dependence: Secondary | ICD-10-CM | POA: Insufficient documentation

## 2016-09-14 DIAGNOSIS — I10 Essential (primary) hypertension: Secondary | ICD-10-CM | POA: Insufficient documentation

## 2016-09-14 DIAGNOSIS — M25561 Pain in right knee: Secondary | ICD-10-CM | POA: Insufficient documentation

## 2016-09-14 DIAGNOSIS — Y9301 Activity, walking, marching and hiking: Secondary | ICD-10-CM | POA: Insufficient documentation

## 2016-09-14 DIAGNOSIS — B9689 Other specified bacterial agents as the cause of diseases classified elsewhere: Secondary | ICD-10-CM

## 2016-09-14 LAB — WET PREP, GENITAL
Sperm: NONE SEEN
Trich, Wet Prep: NONE SEEN
Yeast Wet Prep HPF POC: NONE SEEN

## 2016-09-14 LAB — URINALYSIS, COMPLETE (UACMP) WITH MICROSCOPIC
BILIRUBIN URINE: NEGATIVE
GLUCOSE, UA: NEGATIVE mg/dL
HGB URINE DIPSTICK: NEGATIVE
Ketones, ur: NEGATIVE mg/dL
LEUKOCYTES UA: NEGATIVE
NITRITE: NEGATIVE
PH: 6 (ref 5.0–8.0)
Protein, ur: 30 mg/dL — AB
SPECIFIC GRAVITY, URINE: 1.024 (ref 1.005–1.030)

## 2016-09-14 LAB — CHLAMYDIA/NGC RT PCR (ARMC ONLY)
Chlamydia Tr: NOT DETECTED
N gonorrhoeae: NOT DETECTED

## 2016-09-14 MED ORDER — MELOXICAM 7.5 MG PO TABS: 7.5000 mg | ORAL_TABLET | Freq: Every day | ORAL | 1 refills | Status: AC

## 2016-09-14 MED ORDER — CEPHALEXIN 500 MG PO CAPS: 500.0000 mg | ORAL_CAPSULE | Freq: Four times a day (QID) | ORAL | 0 refills | Status: AC

## 2016-09-14 MED ORDER — METRONIDAZOLE 500 MG PO TABS: 500.0000 mg | ORAL_TABLET | Freq: Two times a day (BID) | ORAL | 0 refills | Status: AC

## 2016-09-14 NOTE — ED Triage Notes (Signed)
  Pt to ED reporting having fallen today after tripping over the curb. PT reports she hit both knees and left hand when falling. Pt did not hit head and denies LOC. Pt reports now her right leg is hurting the worst and she can not bear weight on leg. Small scraps noted ot both knees but no lacerations noted. No deformity or discoloration.   Pt also reports having lower back pain that started to worsen today after the fall. PT verbalized the lower back and flank pain have been worsening over the past week as well as dysuria. PT denies knowing if she has had fevers because she has heat flashes.

## 2016-09-14 NOTE — ED Provider Notes (Signed)
[facility] Emergency Department Provider Note  ____________________________________________  Time seen: Approximately 4:04 PM  I have reviewed the triage vital signs and the nursing notes.   HISTORY  Chief Complaint Fall and Dysuria    HPI Sharon Woods is a 56 y.o. female presenting to the emergency department with 8 out of 10 right knee pain after patient fell while losing her footing. Patient did not hit her head or lose consciousness. Patient denies numbness, tingling or changes in sensation in the right lower extremity. No prior surgeries to the right lower extremity. She also secondarily reports urinary irritation, low back pain and malodorous vaginal discharge for the past several weeks. Patient denies a history of recent urinary tract infection, pyelonephritis or nephrolithiasis. Patient currently walks several miles daily as she does not have a car. Patient is not currently working. No alleviating measures have been attempted.    Past Medical History:  Diagnosis Date  . Hypertension   . Patient denies medical problems     Patient Active Problem List   Diagnosis Date Noted  . Severe recurrent major depression with psychotic features ([place]) 11/15/2015  . Hypertension 11/15/2015  . Noncompliance 11/15/2015  . Severe major depression, single episode, with psychotic features, mood-congruent ([place]) 03/29/2015  . Protein-calorie malnutrition, severe 03/26/2015  . 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    Past Surgical History:  Procedure Laterality Date  . ABDOMINAL HYSTERECTOMY    . CESAREAN SECTION     x2  . NO PAST SURGERIES      Prior to Admission medications   Medication Sig Start Date End Date Taking? Authorizing Provider  cephALEXin (KEFLEX) 500 MG capsule Take 1 capsule (500 mg total) by mouth 4 (four) times daily. 09/14/16 09/24/16  Lannie Fields, PA-C  meloxicam (MOBIC) 7.5 MG tablet Take 1 tablet (7.5 mg total) by mouth daily. 09/14/16 09/21/16  Lannie Fields, PA-C  metroNIDAZOLE (FLAGYL) 500 MG tablet Take 1 tablet (500 mg total) by mouth 2 (two) times daily. 09/14/16 09/21/16  Lannie Fields, PA-C  mirtazapine (REMERON) 30 MG tablet Take 1 tablet (30 mg total) by mouth at bedtime. 11/15/15   Clapacs, Madie Reno, MD  OLANZapine (ZYPREXA) 15 MG tablet Take 2 tablets (30 mg total) by mouth at bedtime. 11/15/15   Clapacs, Madie Reno, MD    Allergies Patient has no known allergies.  Family History  Problem Relation Age of Onset  . Cancer Unknown   . Dementia Unknown     Social History Social History  Substance Use Topics  . Smoking status: Former Smoker    Types: Cigarettes  . Smokeless tobacco: Never Used  . Alcohol use No     Review of Systems  Constitutional: No fever/chills Eyes: No visual changes. No discharge ENT: No upper respiratory complaints. Cardiovascular: no chest pain. Respiratory: no cough. No SOB. Gastrointestinal: No abdominal pain.  No nausea, no vomiting.  No diarrhea.  No constipation. Genitourinary: Patient has urinary irritation and bilateral low back pain.  Musculoskeletal: Patient has right knee pain.  Skin: Negative for rash, abrasions, lacerations, ecchymosis. Neurological: Negative for headaches, focal weakness or numbness.   ____________________________________________   PHYSICAL EXAM:  VITAL SIGNS: ED Triage Vitals  Enc Vitals Group     BP 09/14/16 1513 (!) 164/105     Pulse Rate 09/14/16 1513 95     Resp 09/14/16 1513 16     Temp 09/14/16 1513 98.4 F (36.9 C)     Temp Source 09/14/16 1513 Oral  SpO2 09/14/16 1513 96 %     Weight 09/14/16 1514 142 lb (64.4 kg)     Height 09/14/16 1514 5\' 1"  (1.549 m)     Head Circumference --      Peak Flow --      Pain Score 09/14/16 1513 8     Pain Loc --      Pain Edu? --      Excl. in [place]? --      Constitutional: Alert and oriented. Well appearing and in no acute distress. Eyes: Conjunctivae are normal. PERRL. EOMI. Head:  Atraumatic. Cardiovascular: Normal rate, regular rhythm. Normal S1 and S2.  Good peripheral circulation. Respiratory: Normal respiratory effort without tachypnea or retractions. Lungs CTAB. Good air entry to the bases with no decreased or absent breath sounds. Genitourinary: No cervical motion tenderness was elicited. No palpable masses. Gastrointestinal: Bowel sounds 4 quadrants. Soft. Patient has suprapubic discomfort. No guarding or rigidity. No palpable masses. No distention. No CVA tenderness. Musculoskeletal: She has 5 out of 5 strength in the lower extremities bilaterally. Patient demonstrates full range of motion at the right hip and right ankle. Patient is unable to perform full range of motion at the right knee likely secondary to pain. Right knee: Negative anterior and posterior drawer test. No laxity with MCL or LCL testing. Negative ballottement. Negative apprehension.  Neurologic:  Normal speech and language. No gross focal neurologic deficits are appreciated.  Skin:  Skin is warm, dry and intact. No rash noted. Small abrasion at right knee. Psychiatric: Mood and affect are normal. Speech and behavior are normal. Patient exhibits appropriate insight and judgement.   ____________________________________________   LABS (all labs ordered are listed, but only abnormal results are displayed)  Labs Reviewed  WET PREP, GENITAL - Abnormal; Notable for the following:       Result Value   Clue Cells Wet Prep HPF POC PRESENT (*)    WBC, Wet Prep HPF POC FEW (*)    All other components within normal limits  URINALYSIS, COMPLETE (UACMP) WITH MICROSCOPIC - Abnormal; Notable for the following:    Color, Urine YELLOW (*)    APPearance CLOUDY (*)    Protein, ur 30 (*)    Bacteria, UA FEW (*)    Squamous Epithelial / LPF TOO NUMEROUS TO COUNT (*)    All other components within normal limits  CHLAMYDIA/NGC RT PCR ([facility] ONLY)    ____________________________________________  EKG   ____________________________________________  RADIOLOGY   Dg Knee Complete 4 Views Right  Result Date: 09/14/2016 CLINICAL DATA:  Right knee pain after fall EXAM: RIGHT KNEE - COMPLETE 4+ VIEW COMPARISON:  None. FINDINGS: Soft tissue swelling in the lateral right knee. No fracture, joint effusion or malalignment. No significant arthropathy. No suspicious focal osseous lesion. No radiopaque foreign body. IMPRESSION: Lateral right knee soft tissue swelling. No right knee fracture, joint effusion or malalignment. Electronically Signed   By: Ilona Sorrel M.D.   On: 09/14/2016 17:01    ____________________________________________    PROCEDURES  Procedure(s) performed:    Procedures    Medications - No data to display   ____________________________________________   INITIAL IMPRESSION / ASSESSMENT AND PLAN / ED COURSE  Pertinent labs & imaging results that were available during my care of the patient were reviewed by me and considered in my medical decision making (see chart for details).  Review of the [place] CSRS was performed in accordance of the [place] prior to dispensing any controlled drugs.  Assessment and Plan:  Bacterial vaginosis Acute cystitis Right knee pain Patient presents to the emergency department with right knee pain after a fall. DG right knee reveals no acute fractures or bony abnormalities. She was discharged with Mobitz. Patient also reports urinary irritation and malodorous vaginal discharge. Clue cells were evident on wet prep.  History and physical exam findings are consistent with bacterial vaginosis. Patient was discharged with Flagyl. Patient was also treated empirically for acute cystitis given suprapubic pain and dysuria. She was discharged with Keflex. Vital signs are reassuring at discharge aside from hypertension. All patient questions were  answered. ____________________________________________  FINAL CLINICAL IMPRESSION(S) / ED DIAGNOSES  Final diagnoses:  Acute cystitis without hematuria  Bacterial vaginosis  Acute pain of right knee      NEW MEDICATIONS STARTED DURING THIS VISIT:  Discharge Medication List as of 09/14/2016  5:11 PM    START taking these medications   Details  cephALEXin (KEFLEX) 500 MG capsule Take 1 capsule (500 mg total) by mouth 4 (four) times daily., Starting Sun 09/14/2016, Until Wed 09/24/2016, Print    meloxicam (MOBIC) 7.5 MG tablet Take 1 tablet (7.5 mg total) by mouth daily., Starting Sun 09/14/2016, Until Sun 09/21/2016, Print    metroNIDAZOLE (FLAGYL) 500 MG tablet Take 1 tablet (500 mg total) by mouth 2 (two) times daily., Starting Sun 09/14/2016, Until Sun 09/21/2016, Print            This chart was dictated using voice recognition software/Dragon. Despite best efforts to proofread, errors can occur which can change the meaning. Any change was purely unintentional.    Lannie Fields, PA-C 09/14/16 1839    Lavonia Drafts, MD 09/14/16 [phone]

## 2020-08-15 ENCOUNTER — Ambulatory Visit: Payer: Self-pay | Admitting: Physician Assistant

## 2020-08-15 ENCOUNTER — Other Ambulatory Visit: Payer: Self-pay

## 2020-08-15 DIAGNOSIS — B3731 Acute candidiasis of vulva and vagina: Secondary | ICD-10-CM

## 2020-08-15 DIAGNOSIS — Z113 Encounter for screening for infections with a predominantly sexual mode of transmission: Secondary | ICD-10-CM

## 2020-08-15 DIAGNOSIS — B373 Candidiasis of vulva and vagina: Secondary | ICD-10-CM

## 2020-08-15 LAB — WET PREP FOR TRICH, YEAST, CLUE: Trichomonas Exam: NEGATIVE

## 2020-08-15 MED ORDER — CLOTRIMAZOLE 1 % VA CREA: 1.0000 | TOPICAL_CREAM | Freq: Every day | VAGINAL | 0 refills | Status: AC

## 2020-08-15 NOTE — Progress Notes (Signed)
Wet mount reviewed by provider, dispensed clotrimazole vaginal cream per provider orders. Pt accepted PCP list. Provider orders completed.

## 2020-08-16 ENCOUNTER — Encounter: Payer: Self-pay | Admitting: Physician Assistant

## 2020-08-16 ENCOUNTER — Telehealth: Payer: Self-pay | Admitting: Family Medicine

## 2020-08-16 NOTE — Telephone Encounter (Signed)
Patient called regarding her results for the STD checkup from yesterday, 5/18. Please give her a call back. Thanks

## 2020-08-16 NOTE — Telephone Encounter (Signed)
Phone call to pt. Pt confirmed password from last visit. Pt counseled that new test results are not in at this time, it can take up to 3 weeks to get all results back, if there are any problems with testing or if anything is positive we will call her about the results. Pt requested my chart text to be sent for activation.

## 2020-08-16 NOTE — Progress Notes (Signed)
[facility] Department STI clinic/screening visit  Subjective:  Sharon Woods is a 60 y.o. female being seen today for an STI screening visit. The patient reports they do have symptoms.  Patient reports that they do not desire a pregnancy in the next year.   They reported they are not interested in discussing contraception today.  No LMP recorded (lmp unknown). Patient has had a hysterectomy.   Patient has the following medical conditions:   Patient Active Problem List   Diagnosis Date Noted  . Severe recurrent major depression with psychotic features ([place]) 11/15/2015  . Hypertension 11/15/2015  . Noncompliance 11/15/2015  . Severe major depression, single episode, with psychotic features, mood-congruent ([place]) 03/29/2015  . Protein-calorie malnutrition, severe 03/26/2015  . 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    Chief Complaint  Patient presents with  . SEXUALLY TRANSMITTED DISEASE    STD screening including bloodwork    HPI  Patient reports that she has had a light green discharge with itching for "a while" and states that she has had a lot of trouble "down there" since she had her hysterectomy.  Reports that she has HTN but is not on any medicines currently.  Reports her last HIV test was over 3 years ago and last pap was prior to her hysterectomy.    See flowsheet for further details and programmatic requirements.    The following portions of the patient's history were reviewed and updated as appropriate: allergies, current medications, past medical history, past social history, past surgical history and problem list.  Objective:  There were no vitals filed for this visit.  Physical Exam Constitutional:      General: She is not in acute distress.    Appearance: Normal appearance.  HENT:     Head: Normocephalic and atraumatic.     Comments: No nits,lice, or hair loss. No cervical, supraclavicular or axillary adenopathy.    Mouth/Throat:     Mouth: Mucous membranes are  moist.     Pharynx: Oropharynx is clear. No oropharyngeal exudate or posterior oropharyngeal erythema.  Eyes:     Conjunctiva/sclera: Conjunctivae normal.  Pulmonary:     Effort: Pulmonary effort is normal.  Abdominal:     Palpations: Abdomen is soft. There is no mass.     Tenderness: There is no abdominal tenderness. There is no guarding or rebound.  Genitourinary:    General: Normal vulva.     Rectum: Normal.     Comments: External genitalia/pubic area without nits, lice, edema, erythema, lesions and inguinal adenopathy. Vagina with normal mucosa and small amount of yellow/green, clumping discharge. Cervix and uterus surgically absent. No masses or tenderness on bimanual exam. Musculoskeletal:     Cervical back: Neck supple. No tenderness.  Skin:    General: Skin is warm and dry.     Findings: No bruising, erythema, lesion or rash.  Neurological:     Mental Status: She is alert and oriented to person, place, and time.  Psychiatric:        Mood and Affect: Mood normal.        Behavior: Behavior normal.        Thought Content: Thought content normal.        Judgment: Judgment normal.      Assessment and Plan:  Sharon Woods is a 60 y.o. female presenting to the [facility] Department for STI screening  1. Screening for STD (sexually transmitted disease) Patient into clinic with symptoms. Rec condoms with all sex. Await test results.  Counseled that RN will call if needs to RTC for treatment once results are back. Give patient PCP list to establish care with a provider to follow up on patient's multiple concerns post hysterectomy and BP. - WET PREP FOR [place], YEAST, [place] LAB - Syphilis Serology, [place] [facility]  2. Candidal vulvovaginitis Treat yeast with Clotrimazole 1% vaginal cream 1 app qhs for 7 days. No sex for 10 days. - clotrimazole (CLOTRIMAZOLE-7) 1 % vaginal cream; Place 1 Applicatorful vaginally at  bedtime for 7 days.  Dispense: 45 g; Refill: 0     No follow-ups on file.  No future appointments.  Jerene Dilling, PA

## 2020-08-21 LAB — HM HIV SCREENING LAB: HM HIV Screening: NEGATIVE

## 2020-10-09 ENCOUNTER — Emergency Department: Payer: Self-pay

## 2020-10-09 ENCOUNTER — Emergency Department
Admission: EM | Admit: 2020-10-09 | Discharge: 2020-10-09 | Disposition: A | Discharge status: Home / Self Care | Payer: Self-pay | Attending: Emergency Medicine | Admitting: Emergency Medicine

## 2020-10-09 ENCOUNTER — Other Ambulatory Visit: Payer: Self-pay

## 2020-10-09 ENCOUNTER — Encounter: Payer: Self-pay | Admitting: Emergency Medicine

## 2020-10-09 DIAGNOSIS — N760 Acute vaginitis: Secondary | ICD-10-CM | POA: Insufficient documentation

## 2020-10-09 DIAGNOSIS — M545 Low back pain, unspecified: Secondary | ICD-10-CM | POA: Insufficient documentation

## 2020-10-09 DIAGNOSIS — B9689 Other specified bacterial agents as the cause of diseases classified elsewhere: Secondary | ICD-10-CM | POA: Insufficient documentation

## 2020-10-09 DIAGNOSIS — I10 Essential (primary) hypertension: Secondary | ICD-10-CM | POA: Insufficient documentation

## 2020-10-09 DIAGNOSIS — Z87891 Personal history of nicotine dependence: Secondary | ICD-10-CM | POA: Insufficient documentation

## 2020-10-09 LAB — URINALYSIS, COMPLETE (UACMP) WITH MICROSCOPIC
Bacteria, UA: NONE SEEN
Bilirubin Urine: NEGATIVE
Glucose, UA: >=500 mg/dL — AB
Hgb urine dipstick: NEGATIVE
Ketones, ur: NEGATIVE mg/dL
Leukocytes,Ua: NEGATIVE
Nitrite: NEGATIVE
Protein, ur: NEGATIVE mg/dL
Specific Gravity, Urine: 1.023 (ref 1.005–1.030)
pH: 7 (ref 5.0–8.0)

## 2020-10-09 LAB — WET PREP, GENITAL
Sperm: NONE SEEN
Trich, Wet Prep: NONE SEEN
Yeast Wet Prep HPF POC: NONE SEEN

## 2020-10-09 MED ORDER — METRONIDAZOLE 500 MG PO TABS: 500.0000 mg | ORAL_TABLET | Freq: Two times a day (BID) | ORAL | 0 refills | Status: DC

## 2020-10-09 MED ORDER — FLUCONAZOLE 150 MG PO TABS: 150.0000 mg | ORAL_TABLET | Freq: Once | ORAL | 0 refills | Status: AC

## 2020-10-09 MED ORDER — MELOXICAM 15 MG PO TABS: 15.0000 mg | ORAL_TABLET | Freq: Every day | ORAL | 0 refills | Status: DC

## 2020-10-09 MED ORDER — MELOXICAM 7.5 MG PO TABS
15.0000 mg | ORAL_TABLET | Freq: Once | ORAL | Status: AC
  Administered 2020-10-09: 15 mg via ORAL
  Filled 2020-10-09: qty 2

## 2020-10-09 MED ORDER — METRONIDAZOLE 500 MG PO TABS
500.0000 mg | ORAL_TABLET | Freq: Once | ORAL | Status: AC
  Administered 2020-10-09: 500 mg via ORAL
  Filled 2020-10-09: qty 1

## 2020-10-09 MED ORDER — ONDANSETRON 4 MG PO TBDP: 4.0000 mg | ORAL_TABLET | Freq: Three times a day (TID) | ORAL | 0 refills | Status: DC | PRN

## 2020-10-09 MED ORDER — ONDANSETRON 8 MG PO TBDP
8.0000 mg | ORAL_TABLET | Freq: Once | ORAL | Status: AC
  Administered 2020-10-09: 8 mg via ORAL
  Filled 2020-10-09: qty 1

## 2020-10-09 NOTE — ED Triage Notes (Signed)
C/O low back pain

## 2020-10-09 NOTE — ED Provider Notes (Signed)
[facility] Emergency Department Provider Note  ____________________________________________   Event Date/Time   First MD Initiated Contact with Patient 10/09/20 1401     (approximate)  I have reviewed the triage vital signs and the nursing notes.   HISTORY  Chief Complaint Back Pain   HPI Sharon Woods is a 60 y.o. female who presents to the ER for multiple complaints. First, patient states she is having low back pain for the last 2 to 3 weeks without any known injury.  She did not have any heavy lifting.  She denies any fever, nausea, vomiting.  She also reports that she was seen at the health department for similar symptoms where she was treated for "a urinary tract infection" but that they could not see her for her back, causing her to present here.  She reports that they gave her medications which she used vaginally for a week with minimal improvement in her symptoms.  She reports associated dysuria.  She denies any chest pain, shortness of breath, headaches.         Past Medical History:  Diagnosis Date   Hypertension    Patient denies medical problems     Patient Active Problem List   Diagnosis Date Noted   Severe recurrent major depression with psychotic features ([place]) 11/15/2015   Hypertension 11/15/2015   Noncompliance 11/15/2015   Severe major depression, single episode, with psychotic features, mood-congruent ([place]) 03/29/2015   Protein-calorie malnutrition, severe 03/26/2015   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    Past Surgical History:  Procedure Laterality Date   ABDOMINAL HYSTERECTOMY     CESAREAN SECTION     x2   NO PAST SURGERIES      Prior to Admission medications   Medication Sig Start Date End Date Taking? Authorizing Provider  meloxicam (MOBIC) 15 MG tablet Take 1 tablet (15 mg total) by mouth daily. 10/09/20  Yes Cuthriell, Charline Bills, PA-C  metroNIDAZOLE (FLAGYL) 500 MG tablet Take 1 tablet (500 mg total) by mouth 2 (two) times  daily. 10/09/20  Yes Cuthriell, Charline Bills, PA-C  ondansetron (ZOFRAN-ODT) 4 MG disintegrating tablet Take 1 tablet (4 mg total) by mouth every 8 (eight) hours as needed for nausea or vomiting. 10/09/20  Yes Cuthriell, Charline Bills, PA-C  mirtazapine (REMERON) 30 MG tablet Take 1 tablet (30 mg total) by mouth at bedtime. Patient not taking: Reported on 08/16/2020 11/15/15   Clapacs, Madie Reno, MD  OLANZapine (ZYPREXA) 15 MG tablet Take 2 tablets (30 mg total) by mouth at bedtime. Patient not taking: Reported on 08/16/2020 11/15/15   Clapacs, Madie Reno, MD    Allergies Patient has no known allergies.  Family History  Problem Relation Age of Onset   Cancer Unknown    Dementia Unknown     Social History Social History   Tobacco Use   Smoking status: Former    Pack years: 0.00    Types: Cigarettes   Smokeless tobacco: Never  Substance Use Topics   Alcohol use: No    Alcohol/week: 0.0 standard drinks   Drug use: No    Review of Systems Constitutional: No fever/chills Eyes: No visual changes. ENT: No sore throat. Cardiovascular: Denies chest pain. Respiratory: Denies shortness of breath. Gastrointestinal: No abdominal pain.  No nausea, no vomiting.  No diarrhea.  No constipation. Genitourinary: + Dysuria Musculoskeletal: + Back pain Skin: Negative for rash. Neurological: Negative for headaches, focal weakness or numbness.  ____________________________________________   PHYSICAL EXAM:  VITAL SIGNS: ED Triage Vitals  Enc Vitals Group     BP 10/09/20 1258 (!) 159/105     Pulse Rate 10/09/20 1258 81     Resp 10/09/20 1258 18     Temp 10/09/20 1258 98.5 F (36.9 C)     Temp Source 10/09/20 1258 Oral     SpO2 10/09/20 1258 95 %     Weight 10/09/20 1254 141 lb 15.6 oz (64.4 kg)     Height 10/09/20 1254 5\' 1"  (1.549 m)     Head Circumference --      Peak Flow --      Pain Score 10/09/20 1257 9     Pain Loc --      Pain Edu? --      Excl. in [place]? --    Constitutional: Alert and  oriented. Well appearing and in no acute distress. Eyes: Conjunctivae are normal. PERRL. EOMI. Head: Atraumatic. Nose: No congestion/rhinnorhea. Mouth/Throat: Mucous membranes are moist.  Oropharynx non-erythematous. Neck: No stridor.   Cardiovascular: Normal rate, regular rhythm. Grossly normal heart sounds.  Good peripheral circulation. Respiratory: Normal respiratory effort.  No retractions. Lungs CTAB. Gastrointestinal: Soft and nontender. No distention. No abdominal bruits. No CVA tenderness. Genitourinary: Pelvic exam reveals some mild erythematous irritation on the external genitalia without any pustules or papules.  Vaginal exam reveals a whitish discharge.  Cervix surgically absent. Musculoskeletal: There is tenderness to palpation of the bilateral paraspinal region.  No midline tenderness.  Full range of motion of the bilateral lower extremities with 5/5 strength.  Dorsal pedal pulses 2+ bilaterally. Neurologic:  Normal speech and language. No gross focal neurologic deficits are appreciated. No gait instability. Skin:  Skin is warm, dry and intact. No rash noted. Psychiatric: Mood and affect are normal. Speech and behavior are normal.  ____________________________________________   LABS (all labs ordered are listed, but only abnormal results are displayed)  Labs Reviewed  WET PREP, GENITAL - Abnormal; Notable for the following components:      Result Value   Clue Cells Wet Prep HPF POC PRESENT (*)    WBC, Wet Prep HPF POC MANY (*)    All other components within normal limits  URINALYSIS, COMPLETE (UACMP) WITH MICROSCOPIC - Abnormal; Notable for the following components:   Color, Urine YELLOW (*)    APPearance CLEAR (*)    Glucose, UA >=500 (*)    All other components within normal limits    ____________________________________________   INITIAL IMPRESSION / ASSESSMENT AND PLAN / ED COURSE  As part of my medical decision making, I reviewed the following data within the  [place] notes reviewed and incorporated, Labs reviewed, Radiograph reviewed, and Notes from prior ED visits        Patient is a 60 year old female who presents to the emergency department for evaluation of dysuria as well as back pain.  See HPI for further details.    In triage patient has mild hypertension otherwise normal vital signs.  On physical exam, patient does not have any CVA tenderness, her pain is more paraspinal lower in the lumbar spine.  She does not have any focal neurologic deficits present.  Genitourinary exam reveals some mild external erythematous patches as well as a white discharge.  Review of the patient's chart reveals that she was recently treated for candidal infection by the health department, and not treated for UTI.  Her evaluation for STDs was reportedly negative.  Patient denies any concern for new STDs.  Evaluation was obtained with urinalysis, negative  for bacteria, leukocytes or nitrates.  It does present with a large amount of glucose, patient denies any symptoms related to this.  Wet prep is positive for clue cells.   At this time, vaginal symptoms most likely related to BV.  No evidence of UTI or pyelonephritis at this time.  Suspect that her back pain is most likely related to musculoskeletal pain.  Will obtain lumbar x-rays to evaluate.  At this time, patient is being handed off to oncoming provider Betha Loa, PA-C to await evaluation with this and disposition at that time.      ____________________________________________   FINAL CLINICAL IMPRESSION(S) / ED DIAGNOSES  Final diagnoses:  BV (bacterial vaginosis)  Acute midline low back pain without sciatica     ED Discharge Orders          Ordered    meloxicam (MOBIC) 15 MG tablet  Daily        10/09/20 1701    metroNIDAZOLE (FLAGYL) 500 MG tablet  2 times daily        10/09/20 1701    fluconazole (DIFLUCAN) 150 MG tablet   Once        10/09/20 1701     ondansetron (ZOFRAN-ODT) 4 MG disintegrating tablet  Every 8 hours PRN        10/09/20 1701             Note:  This document was prepared using Dragon voice recognition software and may include unintentional dictation errors.    Marlana Salvage, PA 10/10/20 7510    Harvest Dark, MD 10/10/20 1318

## 2020-10-09 NOTE — ED Notes (Signed)
See triage note  Presents with lower back pain which moves into lower abd and both legs  Denies any urinary sx's or injury

## 2020-10-09 NOTE — ED Provider Notes (Signed)
-----------------------------------------   4:09 PM on 10/09/2020 -----------------------------------------  Blood pressure (!) 159/105, pulse 81, temperature 98.5 F (36.9 C), temperature source Oral, resp. rate 18, height 5\' 1"  (1.549 m), weight 64.4 kg, SpO2 95 %.  Assuming care from [facility], PA-C.  In short, Sharon Woods is a 60 y.o. female with a chief complaint of Back Pain .  Refer to the original H&P for additional details.  The current plan of care is to await x-ray of the lumbar spine.  Patient has been complaining of lower back pain for 2 weeks.  Patient been treated for candidal infection from the health department recently.  Patient was having some ongoing discharge no urinary symptoms.  Urinalysis is reassuring with no evidence of UTI.  Patient does have findings consistent with BV and will be treated with Flagyl for same.  Given the ongoing pain x-ray of the lumbar spine will be performed.  Awaiting this result at this time.   ----------------------------------------- 4:55 PM on 10/09/2020 -----------------------------------------   Imaging returned without acute finding.  Patient will be treated for BV at this time with Flagyl.  First dose of Flagyl is started here.  Patient will have meloxicam for her musculoskeletal back pain.  Follow-up with primary care as needed.  Patient will have a prescription for Diflucan to take after finishing the antibiotics for her BV.  Return precautions discussed with the patient.   ED diagnosis:  BV Low back pain     Darletta Moll, PA-C 10/09/20 1702    Duffy Bruce, MD 10/11/20 [phone]

## 2020-12-04 ENCOUNTER — Emergency Department
Admission: EM | Admit: 2020-12-04 | Discharge: 2020-12-04 | Disposition: A | Discharge status: Home / Self Care | Payer: Self-pay | Attending: Emergency Medicine | Admitting: Emergency Medicine

## 2020-12-04 ENCOUNTER — Emergency Department: Payer: Self-pay

## 2020-12-04 ENCOUNTER — Other Ambulatory Visit: Payer: Self-pay

## 2020-12-04 DIAGNOSIS — Z9189 Other specified personal risk factors, not elsewhere classified: Secondary | ICD-10-CM

## 2020-12-04 DIAGNOSIS — E119 Type 2 diabetes mellitus without complications: Secondary | ICD-10-CM | POA: Insufficient documentation

## 2020-12-04 DIAGNOSIS — L298 Other pruritus: Secondary | ICD-10-CM | POA: Insufficient documentation

## 2020-12-04 DIAGNOSIS — M545 Low back pain, unspecified: Secondary | ICD-10-CM | POA: Insufficient documentation

## 2020-12-04 DIAGNOSIS — J3489 Other specified disorders of nose and nasal sinuses: Secondary | ICD-10-CM | POA: Insufficient documentation

## 2020-12-04 DIAGNOSIS — I10 Essential (primary) hypertension: Secondary | ICD-10-CM | POA: Insufficient documentation

## 2020-12-04 DIAGNOSIS — R1032 Left lower quadrant pain: Secondary | ICD-10-CM | POA: Insufficient documentation

## 2020-12-04 DIAGNOSIS — Z87891 Personal history of nicotine dependence: Secondary | ICD-10-CM | POA: Insufficient documentation

## 2020-12-04 DIAGNOSIS — K760 Fatty (change of) liver, not elsewhere classified: Secondary | ICD-10-CM | POA: Insufficient documentation

## 2020-12-04 DIAGNOSIS — K6289 Other specified diseases of anus and rectum: Secondary | ICD-10-CM | POA: Insufficient documentation

## 2020-12-04 LAB — COMPREHENSIVE METABOLIC PANEL
ALT: 35 U/L (ref 0–44)
AST: 34 U/L (ref 15–41)
Albumin: 4 g/dL (ref 3.5–5.0)
Alkaline Phosphatase: 78 U/L (ref 38–126)
Anion gap: 7 (ref 5–15)
BUN: 9 mg/dL (ref 6–20)
CO2: 29 mmol/L (ref 22–32)
Calcium: 8.8 mg/dL — ABNORMAL LOW (ref 8.9–10.3)
Chloride: 103 mmol/L (ref 98–111)
Creatinine, Ser: 0.64 mg/dL (ref 0.44–1.00)
GFR, Estimated: >60 mL/min (ref >60)
Glucose, Bld: 284 mg/dL — ABNORMAL HIGH (ref 70–99)
Potassium: 3.5 mmol/L (ref 3.5–5.1)
Sodium: 139 mmol/L (ref 135–145)
Total Bilirubin: 1 mg/dL (ref 0.3–1.2)
Total Protein: 7.6 g/dL (ref 6.5–8.1)

## 2020-12-04 LAB — HEMOGLOBIN A1C
Hgb A1c MFr Bld: 10.6 % — ABNORMAL HIGH (ref 4.8–5.6)
Mean Plasma Glucose: 257.52 mg/dL

## 2020-12-04 LAB — URINALYSIS, COMPLETE (UACMP) WITH MICROSCOPIC
Bilirubin Urine: NEGATIVE
Glucose, UA: 500 mg/dL — AB
Nitrite: NEGATIVE
Protein, ur: 30 mg/dL — AB
RBC / HPF: >50 RBC/hpf — ABNORMAL HIGH (ref 0–5)
Specific Gravity, Urine: 1.02 (ref 1.005–1.030)
pH: 7 (ref 5.0–8.0)

## 2020-12-04 LAB — TYPE AND SCREEN
ABO/RH(D): A POS
Antibody Screen: NEGATIVE

## 2020-12-04 LAB — CBC
HCT: 42.1 % (ref 36.0–46.0)
Hemoglobin: 14.3 g/dL (ref 12.0–15.0)
MCH: 30.1 pg (ref 26.0–34.0)
MCHC: 34 g/dL (ref 30.0–36.0)
MCV: 88.6 fL (ref 80.0–100.0)
Platelets: 99 10*3/uL — ABNORMAL LOW (ref 150–400)
RBC: 4.75 MIL/uL (ref 3.87–5.11)
RDW: 13.2 % (ref 11.5–15.5)
WBC: 4.7 10*3/uL (ref 4.0–10.5)
nRBC: 0 % (ref 0.0–0.2)

## 2020-12-04 LAB — CBG MONITORING, ED: Glucose-Capillary: 213 mg/dL — ABNORMAL HIGH (ref 70–99)

## 2020-12-04 LAB — LIPASE, BLOOD: Lipase: 26 U/L (ref 11–51)

## 2020-12-04 MED ORDER — HYDROCORTISONE ACETATE 25 MG RE SUPP: 25.0000 mg | Freq: Two times a day (BID) | RECTAL | 0 refills | Status: AC

## 2020-12-04 MED ORDER — ACETAMINOPHEN 500 MG PO TABS
1000.0000 mg | ORAL_TABLET | ORAL | Status: AC
  Administered 2020-12-04: 1000 mg via ORAL
  Filled 2020-12-04: qty 2

## 2020-12-04 MED ORDER — LORAZEPAM 0.5 MG PO TABS
0.5000 mg | ORAL_TABLET | Freq: Once | ORAL | Status: AC
  Administered 2020-12-04: 0.5 mg via ORAL
  Filled 2020-12-04: qty 1

## 2020-12-04 MED ORDER — IOHEXOL 350 MG/ML SOLN
100.0000 mL | Freq: Once | INTRAVENOUS | Status: AC | PRN
  Administered 2020-12-04: 100 mL via INTRAVENOUS

## 2020-12-04 MED ORDER — HYDROCORTISONE ACETATE 25 MG RE SUPP
25.0000 mg | Freq: Once | RECTAL | Status: AC
  Administered 2020-12-04: 25 mg via RECTAL
  Filled 2020-12-04: qty 1
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
## 2020-12-04 NOTE — ED Notes (Signed)
Pt to CT
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
## 2020-12-04 NOTE — Discharge Instructions (Addendum)
Please call the gastroenterology clinic to set up a follow-up visit.  In addition I strongly recommend you obtain a primary care physician and have further evaluation done for risk of possible early or developing diabetes.  Please return to the emergency room right away if you are to develop a fever, severe nausea, your pain becomes severe or worsens, you are unable to keep food down, begin vomiting any dark or bloody fluid, you develop any dark or bloody stools, feel dehydrated, or other new concerns or symptoms arise.

## 2020-12-04 NOTE — ED Notes (Signed)
POC CBG 213.

## 2020-12-04 NOTE — ED Triage Notes (Signed)
Pt c/o lower back pain with blood clot in her stool last night,.. pt also c/o sinus drainage and needed more meds for PID, still having itching.

## 2020-12-04 NOTE — ED Notes (Signed)
MD at the bedside  

## 2020-12-04 NOTE — ED Notes (Signed)
Care transferred, report received from Reina, RN 

## 2020-12-06 LAB — URINE CULTURE

## 2021-01-22 ENCOUNTER — Emergency Department
Admission: EM | Admit: 2021-01-22 | Discharge: 2021-01-23 | Disposition: A | Discharge status: Home / Self Care | Payer: Self-pay | Attending: Emergency Medicine | Admitting: Emergency Medicine

## 2021-01-22 ENCOUNTER — Other Ambulatory Visit: Payer: Self-pay

## 2021-01-22 DIAGNOSIS — I10 Essential (primary) hypertension: Secondary | ICD-10-CM | POA: Insufficient documentation

## 2021-01-22 DIAGNOSIS — M545 Low back pain, unspecified: Secondary | ICD-10-CM | POA: Insufficient documentation

## 2021-01-22 DIAGNOSIS — Z87891 Personal history of nicotine dependence: Secondary | ICD-10-CM | POA: Insufficient documentation

## 2021-01-22 DIAGNOSIS — G8929 Other chronic pain: Secondary | ICD-10-CM | POA: Insufficient documentation

## 2021-01-22 MED ORDER — IBUPROFEN 400 MG PO TABS
400.0000 mg | ORAL_TABLET | Freq: Once | ORAL | Status: AC
  Administered 2021-01-22: 400 mg via ORAL
  Filled 2021-01-22: qty 1

## 2021-01-22 MED ORDER — CYCLOBENZAPRINE HCL 5 MG PO TABS: 5.0000 mg | ORAL_TABLET | Freq: Three times a day (TID) | ORAL | 0 refills | Status: DC | PRN

## 2021-01-22 MED ORDER — LIDOCAINE 5 % EX PTCH: 1.0000 | MEDICATED_PATCH | Freq: Two times a day (BID) | CUTANEOUS | 0 refills | Status: DC

## 2021-01-22 NOTE — ED Provider Notes (Signed)
[facility] Emergency Department Provider Note   ____________________________________________   Event Date/Time   First MD Initiated Contact with Patient 01/22/21 1353     (approximate)  I have reviewed the triage vital signs and the nursing notes.   HISTORY  Chief Complaint Back Pain    HPI Sharon Woods is a 60 y.o. female with past medical history of hypertension and major depressive disorder who presents to the ED complaining of back pain.  Patient reports that she has been dealing with 4 to 5 days of pain in the left lower portion of her back radiating down her left leg.  Pain is constant and worse when she moves her left leg or attempts to bear weight on it.  She denies any falls or recent trauma to her back or leg.  She has dealt with similar symptoms in the past, states it seems to flareup every so often.  She has not had any numbness or weakness in her legs, denies any saddle anesthesia or urinary retention/incontinence.  She has not had any fevers, flank pain, dysuria, abdominal pain, vomiting, or diarrhea.  She has not taken anything for her symptoms prior to arrival.        Past Medical History:  Diagnosis Date   History of high cholesterol 2000   Hypertension    Patient denies medical problems     Patient Active Problem List   Diagnosis Date Noted   Severe recurrent major depression with psychotic features ([place]) 11/15/2015   Hypertension 11/15/2015   Noncompliance 11/15/2015   Severe major depression, single episode, with psychotic features, mood-congruent ([place]) 03/29/2015   Protein-calorie malnutrition, severe 03/26/2015   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    Past Surgical History:  Procedure Laterality Date   ABDOMINAL HYSTERECTOMY     CESAREAN SECTION     x2   NO PAST SURGERIES      Prior to Admission medications   Medication Sig Start Date End Date Taking? Authorizing Provider  cyclobenzaprine (FLEXERIL) 5 MG tablet Take 1 tablet (5  mg total) by mouth 3 (three) times daily as needed. 01/22/21  Yes Blake Divine, MD  lidocaine (LIDODERM) 5 % Place 1 patch onto the skin every 12 (twelve) hours. Remove & Discard patch within 12 hours or as directed by MD 01/22/21 01/22/22 Yes Blake Divine, MD  meloxicam (MOBIC) 15 MG tablet Take 1 tablet (15 mg total) by mouth daily. 10/09/20   Cuthriell, Charline Bills, PA-C  metroNIDAZOLE (FLAGYL) 500 MG tablet Take 1 tablet (500 mg total) by mouth 2 (two) times daily. 10/09/20   Cuthriell, Charline Bills, PA-C  mirtazapine (REMERON) 30 MG tablet Take 1 tablet (30 mg total) by mouth at bedtime. Patient not taking: Reported on 08/16/2020 11/15/15   Clapacs, Madie Reno, MD  OLANZapine (ZYPREXA) 15 MG tablet Take 2 tablets (30 mg total) by mouth at bedtime. Patient not taking: Reported on 08/16/2020 11/15/15   Clapacs, Madie Reno, MD  ondansetron (ZOFRAN-ODT) 4 MG disintegrating tablet Take 1 tablet (4 mg total) by mouth every 8 (eight) hours as needed for nausea or vomiting. 10/09/20   Cuthriell, Charline Bills, PA-C    Allergies Patient has no known allergies.  Family History  Problem Relation Age of Onset   Cancer Unknown    Dementia Unknown     Social History Social History   Tobacco Use   Smoking status: Former    Types: Cigarettes   Smokeless tobacco: Never  Substance Use Topics   Alcohol use:  No    Alcohol/week: 0.0 standard drinks   Drug use: No    Review of Systems  Constitutional: No fever/chills Eyes: No visual changes. ENT: No sore throat. Cardiovascular: Denies chest pain. Respiratory: Denies shortness of breath. Gastrointestinal: No abdominal pain.  No nausea, no vomiting.  No diarrhea.  No constipation. Genitourinary: Negative for dysuria. Musculoskeletal: Positive for back pain. Skin: Negative for rash. Neurological: Negative for headaches, focal weakness or numbness.  ____________________________________________   PHYSICAL EXAM:  VITAL SIGNS: ED Triage Vitals [01/22/21  1326]  Enc Vitals Group     BP (!) 175/100     Pulse Rate 84     Resp 18     Temp 98.4 F (36.9 C)     Temp Source Oral     SpO2 97 %     Weight 187 lb (84.8 kg)     Height 5\' 1"  (1.549 m)     Head Circumference      Peak Flow      Pain Score 9     Pain Loc      Pain Edu?      Excl. in [place]?     Constitutional: Alert and oriented. Eyes: Conjunctivae are normal. Head: Atraumatic. Nose: No congestion/rhinnorhea. Mouth/Throat: Mucous membranes are moist. Neck: Normal ROM Cardiovascular: Normal rate, regular rhythm. Grossly normal heart sounds.  2+ DP pulses bilaterally. Respiratory: Normal respiratory effort.  No retractions. Lungs CTAB. Gastrointestinal: Soft and nontender. No distention. Genitourinary: deferred Musculoskeletal: No lower extremity tenderness nor edema.  No midline lumbar spinal tenderness to palpation. Neurologic:  Normal speech and language. No gross focal neurologic deficits are appreciated. Skin:  Skin is warm, dry and intact. No rash noted. Psychiatric: Mood and affect are normal. Speech and behavior are normal.  ____________________________________________   LABS (all labs ordered are listed, but only abnormal results are displayed)  Labs Reviewed - No data to display   PROCEDURES  Procedure(s) performed (including Critical Care):  Procedures   ____________________________________________   INITIAL IMPRESSION / ASSESSMENT AND PLAN / ED COURSE      60 year old female with past medical history of hypertension and major depressive disorder who presents to the ED complaining of pain in the left side of her back radiating down her left leg for the past 4 to 5 days, has had similar flareups of pain in the past.  Symptoms are consistent with a lumbar radiculopathy, patient is neurovascularly intact to her lower extremities and there are no findings concerning for cauda equina.  No recent trauma to necessitate imaging and patient is appropriate for  discharge home with symptomatic management.  She was counseled to follow-up with her PCP and to return to the ED for new worsening symptoms, patient agrees with plan.      ____________________________________________   FINAL CLINICAL IMPRESSION(S) / ED DIAGNOSES  Final diagnoses:  Chronic left-sided low back pain without sciatica     ED Discharge Orders          Ordered    lidocaine (LIDODERM) 5 %  Every 12 hours        01/22/21 1403    cyclobenzaprine (FLEXERIL) 5 MG tablet  3 times daily PRN        01/22/21 1403             Note:  This document was prepared using Dragon voice recognition software and may include unintentional dictation errors.    Blake Divine, MD 01/22/21 2115

## 2021-01-22 NOTE — ED Triage Notes (Signed)
Pt c/o left lower back pain radiating into the buttock and leg for the past several days. Denies injury, states she has had the same sx in the past.

## 2021-08-05 ENCOUNTER — Other Ambulatory Visit: Payer: Self-pay

## 2021-08-05 DIAGNOSIS — Z1211 Encounter for screening for malignant neoplasm of colon: Secondary | ICD-10-CM

## 2021-08-05 DIAGNOSIS — N644 Mastodynia: Secondary | ICD-10-CM

## 2021-08-06 ENCOUNTER — Ambulatory Visit
Admission: RE | Admit: 2021-08-06 | Discharge: 2021-08-06 | Disposition: A | Discharge status: Home / Self Care | Payer: Medicaid Other | Source: Ambulatory Visit | Attending: Obstetrics and Gynecology | Admitting: Obstetrics and Gynecology

## 2021-08-06 ENCOUNTER — Ambulatory Visit: Payer: Self-pay | Attending: Hematology and Oncology | Admitting: *Deleted

## 2021-08-06 ENCOUNTER — Other Ambulatory Visit: Payer: Self-pay | Admitting: Obstetrics and Gynecology

## 2021-08-06 VITALS — BP 142/100 | HR 83 | Resp 18 | Wt 185.0 lb

## 2021-08-06 DIAGNOSIS — N6325 Unspecified lump in the left breast, overlapping quadrants: Secondary | ICD-10-CM

## 2021-08-06 DIAGNOSIS — N644 Mastodynia: Secondary | ICD-10-CM

## 2021-08-06 DIAGNOSIS — N6313 Unspecified lump in the right breast, lower outer quadrant: Secondary | ICD-10-CM | POA: Insufficient documentation

## 2021-08-06 DIAGNOSIS — R928 Other abnormal and inconclusive findings on diagnostic imaging of breast: Secondary | ICD-10-CM

## 2021-08-06 DIAGNOSIS — N63 Unspecified lump in unspecified breast: Secondary | ICD-10-CM

## 2021-08-06 DIAGNOSIS — R599 Enlarged lymph nodes, unspecified: Secondary | ICD-10-CM

## 2021-08-06 NOTE — Progress Notes (Signed)
Patient ID: Sharon Woods, female   DOB: [birth year], 61 y.o.   MRN: [id number] ? ? ?Sharon Woods is a 61 y.o. female who presents to [facility] clinic today with complaint of left breast pain and a new found lump. Patient is a very poor historian and it's difficult to get clear communication with her.  I believe she noticed that she had breast changes a couple of weeks ago with severe pain.  Rates pain an 8-9 on a 0/10 scale.  States some relief with Advil.  ?  ?Pap Smear: Pap not smear completed today. Last Pap smear was 1997 at [facility] clinic and was normal. Per patient has no history of an abnormal Pap smear. Last Pap smear result is available in Epic.  Patient with a history of a hysterectomy in her 40's per patient.   ?  ?Physical exam:Physical Exam ?Chest:  ?Breasts: ?   Right: No swelling, bleeding, inverted nipple, mass, nipple discharge, skin change or tenderness.  ?   Left: Mass, nipple discharge, skin change and tenderness present. No swelling, bleeding or inverted nipple.  ? ? ?   Comments: Patient also complains of left nipple discharge ?Lymphadenopathy:  ?   Upper Body:  ?   Right upper body: No supraclavicular or axillary adenopathy.  ?   Left upper body: Axillary adenopathy present. No supraclavicular adenopathy.  ?Breasts ?Breasts symmetrical. No skin abnormalities bilateral breasts. No nipple retraction bilateral breasts. No nipple discharge bilateral breasts on exam. Patient states she expresses discharge from the left nipple.  Left lymphadenopathy noted. Left breast induration at 4:00 with an irregular thickening like mass from 3-5:00.  Patient complains of pain from the mass that radiates to the axilla.     ?  ?Pelvic/Bimanual ?Pap is not indicated today  ?  ?Smoking History: ?Patient has is a former smoker Not referred to quit line.  ?  ?Patient Navigation: ?Patient education provided. Access to services provided for patient through [facility] program. No interpreter provided. No transportation  provided  ? ?Colorectal Cancer Screening: ?Per patient has never had colonoscopy completed No complaints today.  ?  ?Breast and Cervical Cancer Risk Assessment: ?Patient does not have family history of breast cancer, known genetic mutations, or radiation treatment to the chest before age 40. Patient does not have history of cervical dysplasia, immunocompromised, or DES exposure in-utero. ? ?Risk Assessment   ? ? Risk Scores   ? ?   08/06/2021  ? Last edited by: Drue Dun, RN  ? 5-year risk: 0.9 %  ? Lifetime risk: 4.9 %  ? ?  ?  ? ?  ? ? ?A: ?[facility] exam without pap smear ?Complaint of breast pain and mass ? ?P: ?Referred patient to the [facility] for a diagnostic mammogram. Appointment scheduled today. ? ?Sharon Junker, RN ?08/06/2021 9:54 AM   ?

## 2021-08-06 NOTE — Progress Notes (Deleted)
?  Subjective:  ?  ? Patient ID: Sharon Woods, female   DOB: November 01, 1960, 61 y.o.   MRN: [id number] ? ?HPI ? ? ?Review of Systems ? ?   ?Objective:  ? Physical Exam ?Chest:  ?Breasts: ?   Right: No swelling, bleeding, inverted nipple, mass, nipple discharge, skin change or tenderness.  ?   Left: Mass, nipple discharge, skin change and tenderness present. No swelling, bleeding or inverted nipple.  ? ? ?   Comments: Patient also complains of left nipple discharge ?Lymphadenopathy:  ?   Upper Body:  ?   Right upper body: No supraclavicular or axillary adenopathy.  ?   Left upper body: Axillary adenopathy present. No supraclavicular adenopathy.  ? ?   ?Assessment:  ?   ?See notes ?   ?Plan:  ?   ?See notes ?   ?

## 2021-08-21 ENCOUNTER — Ambulatory Visit
Admission: RE | Admit: 2021-08-21 | Discharge: 2021-08-21 | Disposition: A | Discharge status: Home / Self Care | Payer: Medicaid Other | Source: Ambulatory Visit | Attending: Obstetrics and Gynecology | Admitting: Obstetrics and Gynecology

## 2021-08-21 DIAGNOSIS — C50811 Malignant neoplasm of overlapping sites of right female breast: Secondary | ICD-10-CM | POA: Insufficient documentation

## 2021-08-21 DIAGNOSIS — R599 Enlarged lymph nodes, unspecified: Secondary | ICD-10-CM | POA: Insufficient documentation

## 2021-08-21 DIAGNOSIS — N63 Unspecified lump in unspecified breast: Secondary | ICD-10-CM | POA: Insufficient documentation

## 2021-08-21 DIAGNOSIS — C50812 Malignant neoplasm of overlapping sites of left female breast: Secondary | ICD-10-CM | POA: Diagnosis not present

## 2021-08-21 DIAGNOSIS — R928 Other abnormal and inconclusive findings on diagnostic imaging of breast: Secondary | ICD-10-CM | POA: Diagnosis present

## 2021-08-21 HISTORY — PX: BREAST BIOPSY: SHX20

## 2021-08-23 ENCOUNTER — Encounter: Payer: Self-pay | Admitting: *Deleted

## 2021-08-23 ENCOUNTER — Telehealth: Payer: Self-pay

## 2021-08-23 ENCOUNTER — Other Ambulatory Visit: Payer: Self-pay | Admitting: *Deleted

## 2021-08-23 DIAGNOSIS — C50919 Malignant neoplasm of unspecified site of unspecified female breast: Secondary | ICD-10-CM

## 2021-08-23 LAB — SURGICAL PATHOLOGY

## 2021-08-23 NOTE — Telephone Encounter (Signed)
Attempted to contact patient regarding completing [facility] Medicaid application. Left message on identifying voicemail requesting a return call.

## 2021-08-23 NOTE — Progress Notes (Signed)
Received referral for newly diagnosed breast cancer from [facility] Radiology.  Navigation initiated.  Med Onc and Surgical consults scheduled. Dr. Tasia Catchings 5/31 and Dr. Christian Mate on 6/1.

## 2021-08-28 ENCOUNTER — Encounter: Payer: Self-pay | Admitting: Oncology

## 2021-08-28 ENCOUNTER — Encounter: Payer: Self-pay | Admitting: *Deleted

## 2021-08-28 ENCOUNTER — Inpatient Hospital Stay: Payer: Medicaid Other | Attending: Oncology | Admitting: Oncology

## 2021-08-28 ENCOUNTER — Ambulatory Visit
Admission: RE | Admit: 2021-08-28 | Discharge: 2021-08-28 | Disposition: A | Discharge status: Home / Self Care | Payer: Medicaid Other | Source: Ambulatory Visit | Attending: Oncology | Admitting: Oncology

## 2021-08-28 ENCOUNTER — Inpatient Hospital Stay: Payer: Medicaid Other

## 2021-08-28 VITALS — BP 146/90 | HR 75 | Temp 97.5°F | Ht 61.0 in | Wt 184.0 lb

## 2021-08-28 DIAGNOSIS — C50412 Malignant neoplasm of upper-outer quadrant of left female breast: Secondary | ICD-10-CM | POA: Insufficient documentation

## 2021-08-28 DIAGNOSIS — Z87891 Personal history of nicotine dependence: Secondary | ICD-10-CM | POA: Diagnosis not present

## 2021-08-28 DIAGNOSIS — Z170 Estrogen receptor positive status [ER+]: Secondary | ICD-10-CM | POA: Insufficient documentation

## 2021-08-28 DIAGNOSIS — Z7189 Other specified counseling: Secondary | ICD-10-CM | POA: Diagnosis not present

## 2021-08-28 DIAGNOSIS — D696 Thrombocytopenia, unspecified: Secondary | ICD-10-CM | POA: Diagnosis not present

## 2021-08-28 DIAGNOSIS — Z803 Family history of malignant neoplasm of breast: Secondary | ICD-10-CM | POA: Insufficient documentation

## 2021-08-28 DIAGNOSIS — C50912 Malignant neoplasm of unspecified site of left female breast: Secondary | ICD-10-CM | POA: Insufficient documentation

## 2021-08-28 DIAGNOSIS — C50511 Malignant neoplasm of lower-outer quadrant of right female breast: Secondary | ICD-10-CM | POA: Diagnosis not present

## 2021-08-28 DIAGNOSIS — Z801 Family history of malignant neoplasm of trachea, bronchus and lung: Secondary | ICD-10-CM | POA: Diagnosis not present

## 2021-08-28 DIAGNOSIS — F323 Major depressive disorder, single episode, severe with psychotic features: Secondary | ICD-10-CM | POA: Insufficient documentation

## 2021-08-28 LAB — COMPREHENSIVE METABOLIC PANEL
ALT: 25 U/L (ref 0–44)
AST: 30 U/L (ref 15–41)
Albumin: 4.1 g/dL (ref 3.5–5.0)
Alkaline Phosphatase: 83 U/L (ref 38–126)
Anion gap: 8 (ref 5–15)
BUN: 11 mg/dL (ref 6–20)
CO2: 25 mmol/L (ref 22–32)
Calcium: 9 mg/dL (ref 8.9–10.3)
Chloride: 103 mmol/L (ref 98–111)
Creatinine, Ser: 0.67 mg/dL (ref 0.44–1.00)
GFR, Estimated: >60 mL/min (ref >60)
Glucose, Bld: 271 mg/dL — ABNORMAL HIGH (ref 70–99)
Potassium: 3.7 mmol/L (ref 3.5–5.1)
Sodium: 136 mmol/L (ref 135–145)
Total Bilirubin: 0.6 mg/dL (ref 0.3–1.2)
Total Protein: 8.2 g/dL — ABNORMAL HIGH (ref 6.5–8.1)

## 2021-08-28 LAB — CBC WITH DIFFERENTIAL/PLATELET
Abs Immature Granulocytes: 0.01 10*3/uL (ref 0.00–0.07)
Basophils Absolute: 0 10*3/uL (ref 0.0–0.1)
Basophils Relative: 1 %
Eosinophils Absolute: 0.1 10*3/uL (ref 0.0–0.5)
Eosinophils Relative: 1 %
HCT: 43.3 % (ref 36.0–46.0)
Hemoglobin: 14.5 g/dL (ref 12.0–15.0)
Immature Granulocytes: 0 %
Lymphocytes Relative: 28 %
Lymphs Abs: 1.3 10*3/uL (ref 0.7–4.0)
MCH: 29.3 pg (ref 26.0–34.0)
MCHC: 33.5 g/dL (ref 30.0–36.0)
MCV: 87.5 fL (ref 80.0–100.0)
Monocytes Absolute: 0.3 10*3/uL (ref 0.1–1.0)
Monocytes Relative: 5 %
Neutro Abs: 3.1 10*3/uL (ref 1.7–7.7)
Neutrophils Relative %: 65 %
Platelets: 94 10*3/uL — ABNORMAL LOW (ref 150–400)
RBC: 4.95 MIL/uL (ref 3.87–5.11)
RDW: 13.2 % (ref 11.5–15.5)
WBC: 4.7 10*3/uL (ref 4.0–10.5)
nRBC: 0 % (ref 0.0–0.2)

## 2021-08-28 MED ORDER — IOHEXOL 300 MG/ML  SOLN
100.0000 mL | Freq: Once | INTRAMUSCULAR | Status: AC | PRN
  Administered 2021-08-28: 100 mL via INTRAVENOUS

## 2021-08-28 NOTE — Progress Notes (Signed)
Accompanied patient and family to initial medical oncology appointment.   Reviewed Breast Cancer treatment handbook.   Care plan summary given to patient.   Reviewed outreach programs and cancer center services.   

## 2021-08-28 NOTE — Progress Notes (Signed)
Hematology/Oncology Consult note [phone] [phone]         Patient Care Team: Patient, No Pcp Per (Inactive) as PCP - General (General Practice) Brannock, Heath Gold, RN Earlie Server, MD as Consulting Physician (Oncology) Daiva Huge, RN as Oncology Nurse Navigator  REFERRING PROVIDER: Mora Bellman, MD  CHIEF COMPLAINTS/REASON FOR VISIT:  Evaluation of bilateral breast cancer  HISTORY OF PRESENTING ILLNESS:   Sharon Woods is a  61 y.o.  female with PMH listed below was seen in consultation at the request of  Constant, Peggy, MD  for evaluation of bilateral breast cancer  Patient has felt a left breast mass.  08/06/2021, digital bilateral mammogram and ultrasound showed left breast 3:00 mass, 1.7 x 1.7 x 1.7 cm, ultrasound of the left axillary is negative. 08/21/2021 right breast 1 x 0.7 x 0.8 cm angulated spiculated mass at the right breast 7:00, 5 cm from nipple.  Ultrasound of the right axillary demonstrates 3 abnormal thickened cortex lymph node. 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 Franki Cabot right breast 7:00, invasive mammary carcinoma with lobular features, in situ carcinoma,  ER+, PR+, HER 2-This was found to be concordant by Dr. Franki Cabot. right axilla lymph node +, extracapsular extension.  -This was found to be concordant by Dr. Franki Cabot  there are indeterminate calcifications and asymmetry which extent 2.8 cm posterior-superior to the biopsied mass in the left breast. Which will need additional stereotactic biopsy or wide/bracketed excision to ensure adequate excision. Alternatively, consider breast MRI given lobular histology and to define the extent of disease. Further recommendations will be guided by these decisions.  Patient was recommended to establish  care with oncology for further evaluation. Patient was accompanied by her husband.  Patient reports feeling angry and in denial about the diagnosis.  She is a poor historian.  History of major depression, psychosis, previously on olanzapine and Remeron not currently on any and if not currently following up with psychiatrist. Patient's family history is positive for sister with breast cancer + Back pain  Menarche 3 or 61 years of age, Patient has 2 sons. Age at first birth, 65, History of breast pill use: 5 to 10 years Hysterectomy, in her 46s.   Hormone replacement therapy, 3 years 2000 10/30/2009 approximately. Denies any previous breast biopsy.  Denies previous breast radiation.    Review of Systems  Constitutional:  Negative for appetite change, chills, fatigue and fever.  HENT:   Negative for hearing loss and voice change.   Eyes:  Negative for eye problems.  Respiratory:  Negative for chest tightness and cough.   Cardiovascular:  Negative for chest pain.  Gastrointestinal:  Negative for abdominal distention, abdominal pain and blood in stool.  Endocrine: Negative for hot flashes.  Genitourinary:  Negative for difficulty urinating and frequency.   Musculoskeletal:  Negative for arthralgias.  Skin:  Negative for itching and rash.  Neurological:  Negative for extremity weakness.  Hematological:  Negative for adenopathy.  Psychiatric/Behavioral:  Negative for confusion.    MEDICAL HISTORY:  Past Medical History:  Diagnosis Date   History of high cholesterol 2000   Hypertension    Patient denies medical problems     SURGICAL HISTORY: Past Surgical History:  Procedure Laterality Date   ABDOMINAL HYSTERECTOMY     BREAST BIOPSY Right 08/21/2021  [place] bx 7:00 mass coil clip path pending   BREAST BIOPSY Right 08/21/2021   [place] bx of LN, hydro marker, path pending   BREAST BIOPSY Left 08/21/2021   [place] bx, heart marker, path pending   CESAREAN SECTION     x2   NO PAST  SURGERIES      SOCIAL HISTORY: Social History   Socioeconomic History   Marital status: Married    Spouse name: Not on file   Number of children: Not on file   Years of education: Not on file   Highest education level: Not on file  Occupational History   Not on file  Tobacco Use   Smoking status: Former    Types: Cigarettes   Smokeless tobacco: Never  Vaping Use   Vaping Use: Former  Substance and Sexual Activity   Alcohol use: No    Alcohol/week: 0.0 standard drinks   Drug use: No   Sexual activity: Not Currently    Comment: unable to assess   Other Topics Concern   Not on file  Social History Narrative   Not on file   Social Determinants of Health   Financial Resource Strain: Not on file  Food Insecurity: Not on file  Transportation Needs: Not on file  Physical Activity: Not on file  Stress: Not on file  Social Connections: Not on file  Intimate Partner Violence: Not on file    FAMILY HISTORY: Family History  Problem Relation Age of Onset   Lung cancer Mother    Dementia Mother    Parkinson's disease Father    Diabetes Sister    Heart attack Sister    Breast cancer Sister    Dementia Maternal Grandmother    Cancer Other    Dementia Other     ALLERGIES:  has No Known Allergies.  MEDICATIONS:  Current Outpatient Medications  Medication Sig Dispense Refill   bismuth subsalicylate (PEPTO BISMOL) 262 MG/15ML suspension Take 30 mLs by mouth every 6 (six) hours as needed. (Patient not taking: Reported on 08/28/2021)     calcium carbonate (TUMS - DOSED IN MG ELEMENTAL CALCIUM) 500 MG chewable tablet Chew 1 tablet by mouth daily. (Patient not taking: Reported on 08/28/2021)     cyclobenzaprine (FLEXERIL) 5 MG tablet Take 1 tablet (5 mg total) by mouth 3 (three) times daily as needed. (Patient not taking: Reported on 08/06/2021) 12 tablet 0   ibuprofen (ADVIL) 100 MG tablet Take by mouth every 6 (six) hours as needed for fever. (Patient not taking: Reported on  08/28/2021)     lidocaine (LIDODERM) 5 % Place 1 patch onto the skin every 12 (twelve) hours. Remove & Discard patch within 12 hours or as directed by MD (Patient not taking: Reported on 08/06/2021) 10 patch 0   meloxicam (MOBIC) 15 MG tablet Take 1 tablet (15 mg total) by mouth daily. (Patient not taking: Reported on 08/06/2021) 30 tablet 0   metroNIDAZOLE (FLAGYL) 500 MG tablet Take 1 tablet (500 mg total) by mouth 2 (two) times daily. (Patient not taking: Reported on 08/06/2021) 14 tablet 0   mirtazapine (REMERON) 30 MG tablet Take 1 tablet (30 mg total) by mouth at bedtime. (Patient not taking: Reported on 08/16/2020) 30 tablet 1   OLANZapine (ZYPREXA) 15 MG tablet Take 2 tablets (30 mg total) by mouth at bedtime. (Patient not taking: Reported on 08/16/2020) 60 tablet 1   ondansetron (ZOFRAN-ODT) 4 MG disintegrating tablet Take 1 tablet (4 mg total) by mouth every 8 (eight) hours  as needed for nausea or vomiting. (Patient not taking: Reported on 08/06/2021) 20 tablet 0   No current facility-administered medications for this visit.     PHYSICAL EXAMINATION: ECOG PERFORMANCE STATUS: 0 - Asymptomatic Vitals:   08/28/21 1049  BP: (!) 146/90  Pulse: 75  Temp: (!) 97.5 F (36.4 C)   Filed Weights   08/28/21 1049  Weight: 184 lb (83.5 kg)    Physical Exam Constitutional:      General: She is not in acute distress. HENT:     Head: Normocephalic and atraumatic.  Eyes:     General: No scleral icterus. Cardiovascular:     Rate and Rhythm: Normal rate and regular rhythm.     Heart sounds: Normal heart sounds.  Pulmonary:     Effort: Pulmonary effort is normal. No respiratory distress.     Breath sounds: No wheezing.  Abdominal:     General: Bowel sounds are normal. There is no distension.     Palpations: Abdomen is soft.  Musculoskeletal:        General: No deformity. Normal range of motion.     Cervical back: Normal range of motion and neck supple.     Comments: Tender lower lumbar spine  with palpation.  Skin:    General: Skin is warm and dry.     Findings: No erythema or rash.  Neurological:     Mental Status: She is alert and oriented to person, place, and time. Mental status is at baseline.     Cranial Nerves: No cranial nerve deficit.     Coordination: Coordination normal.  Psychiatric:     Comments: Flat affect   Breast exam was performed in seated and lying down position. Status post bilateral breast biopsy with focal bruising at the site of biopsy. Palpable left breast upper outer quadrant mass.  I am not able to appreciate right mass and right axillary lymph nodes   LABORATORY DATA:  I have reviewed the data as listed Lab Results  Component Value Date   WBC 4.7 08/28/2021   HGB 14.5 08/28/2021   HCT 43.3 08/28/2021   MCV 87.5 08/28/2021   PLT 94 (L) 08/28/2021   Recent Labs    12/04/20 1527 08/28/21 1143  NA 139 136  K 3.5 3.7  CL 103 103  CO2 29 25  GLUCOSE 284* 271*  BUN 9 11  CREATININE 0.64 0.67  CALCIUM 8.8* 9.0  GFRNONAA >60 >60  PROT 7.6 8.2*  ALBUMIN 4.0 4.1  AST 34 30  ALT 35 25  ALKPHOS 78 83  BILITOT 1.0 0.6   Iron/TIBC/Ferritin/ %Sat No results found for: IRON, TIBC, FERRITIN, IRONPCTSAT    RADIOGRAPHIC STUDIES: I have personally reviewed the radiological images as listed and agreed with the findings in the report. CT CHEST ABDOMEN PELVIS W CONTRAST  Result Date: 08/28/2021 CLINICAL DATA:  Breast cancer EXAM: CT CHEST, ABDOMEN, AND PELVIS WITH CONTRAST TECHNIQUE: Multidetector CT imaging of the chest, abdomen and pelvis was performed following the standard protocol during bolus administration of intravenous contrast. RADIATION DOSE REDUCTION: This exam was performed according to the departmental dose-optimization program which includes automated exposure control, adjustment of the mA and/or kV according to patient size and/or use of iterative reconstruction technique. CONTRAST:  147m OMNIPAQUE IOHEXOL 300 MG/ML  SOLN  COMPARISON:  CT abdomen 12/04/2020 FINDINGS: CT CHEST FINDINGS Cardiovascular: Normal heart size. No pericardial effusion. Thoracic aorta is normal in caliber. Mediastinum/Nodes: Included thyroid is unremarkable. Esophagus is unremarkable. No enlarged mediastinal  or hilar nodes. Asymmetric right axillary nodes with mild enlargement. Lungs/Pleura: No consolidation or mass. Left lower lobe linear atelectasis/scarring. No pleural effusion. Musculoskeletal: No aggressive osseous lesion. However, there are a few scattered punctate foci of sclerosis. Chronic mild loss of height at the T8 superior endplate. CT ABDOMEN PELVIS FINDINGS Hepatobiliary: No focal liver abnormality. Gallbladder is unremarkable. No biliary dilatation. Pancreas: Unremarkable. Spleen: Similar mild splenomegaly. Adrenals/Urinary Tract: Adrenals are unremarkable. Too small to characterize low-density renal lesions. 4 mm nonobstructing calculus of the left kidney. Punctate nonobstructing right renal calculi. Bladder is unremarkable. Stomach/Bowel: Stomach is within normal limits. Bowel is normal in caliber. Normal appendix. Vascular/Lymphatic: Question of portal hypertension as before. No enlarged nodes. Reproductive: Status post hysterectomy. No adnexal masses. Other: No free fluid.  Abdominal wall is unremarkable. Musculoskeletal: Degenerative changes of the lumbar spine. No aggressive osseous lesion. Few punctate foci of sclerosis are new from the prior study. For example, at the anterior aspect of L5. IMPRESSION: Asymmetric mildly enlarged right axillary nodes. Correlate with biopsy. There are some punctate foci of sclerosis in the included spine that could reflect subtle metastatic disease. Nonobstructing bilateral renal calculi. Electronically Signed   By: Macy Mis M.D.   On: 08/28/2021 14:21   US BREAST LTD UNI LEFT INC AXILLA  Result Date: 08/06/2021 CLINICAL DATA:  Palpable lump left breast EXAM: DIGITAL DIAGNOSTIC BILATERAL MAMMOGRAM  WITH TOMOSYNTHESIS AND CAD; ULTRASOUND LEFT BREAST LIMITED; ULTRASOUND RIGHT BREAST LIMITED TECHNIQUE: Bilateral digital diagnostic mammography and breast tomosynthesis was performed. The images were evaluated with computer-aided detection.; Targeted ultrasound examination of the left breast was performed.; Targeted ultrasound examination of the right breast was performed COMPARISON:  Previous exam(s). ACR Breast Density Category c: The breast tissue is heterogeneously dense, which may obscure small masses. FINDINGS: Cc and MLO views of bilateral breasts, spot tangential view of left breast are submitted. At the palpable area lateral left breast, there is a spiculated mass with associated calcifications. In the slight lateral lower right breast, there is a spiculated mass. Targeted ultrasound is performed, showing angulated hypoechoic mass at palpable area left breast 3 o'clock cm from nipple measuring 1.7 x 1.7 x 1.6 cm correlating to. Ultrasound of the left axilla is negative. Ultrasound right breast demonstrates a 1 x 0.7 x 0.8 cm angulated spiculated mass at right breast 7 o'clock 5 cm from nipple correlating to the mammographic mass. Ultrasound of the right axilla demonstrates at 3 abnormal thickened cortex lymph node. IMPRESSION: Highly suspicious findings. RECOMMENDATION: Recommend ultrasound-guided core biopsies x3 of bilateral breast mass and abnormal right axillary lymph node. I have discussed the findings and recommendations with the patient. If applicable, a reminder letter will be sent to the patient regarding the next appointment. BI-RADS CATEGORY  5: Highly suggestive of malignancy. Electronically Signed   By: Abelardo Diesel M.D.   On: 08/06/2021 12:53  US BREAST LTD UNI RIGHT INC AXILLA  Result Date: 08/06/2021 CLINICAL DATA:  Palpable lump left breast EXAM: DIGITAL DIAGNOSTIC BILATERAL MAMMOGRAM WITH TOMOSYNTHESIS AND CAD; ULTRASOUND LEFT BREAST LIMITED; ULTRASOUND RIGHT BREAST LIMITED TECHNIQUE:  Bilateral digital diagnostic mammography and breast tomosynthesis was performed. The images were evaluated with computer-aided detection.; Targeted ultrasound examination of the left breast was performed.; Targeted ultrasound examination of the right breast was performed COMPARISON:  Previous exam(s). ACR Breast Density Category c: The breast tissue is heterogeneously dense, which may obscure small masses. FINDINGS: Cc and MLO views of bilateral breasts, spot tangential view of left breast are submitted. At the palpable  area lateral left breast, there is a spiculated mass with associated calcifications. In the slight lateral lower right breast, there is a spiculated mass. Targeted ultrasound is performed, showing angulated hypoechoic mass at palpable area left breast 3 o'clock cm from nipple measuring 1.7 x 1.7 x 1.6 cm correlating to. Ultrasound of the left axilla is negative. Ultrasound right breast demonstrates a 1 x 0.7 x 0.8 cm angulated spiculated mass at right breast 7 o'clock 5 cm from nipple correlating to the mammographic mass. Ultrasound of the right axilla demonstrates at 3 abnormal thickened cortex lymph node. IMPRESSION: Highly suspicious findings. RECOMMENDATION: Recommend ultrasound-guided core biopsies x3 of bilateral breast mass and abnormal right axillary lymph node. I have discussed the findings and recommendations with the patient. If applicable, a reminder letter will be sent to the patient regarding the next appointment. BI-RADS CATEGORY  5: Highly suggestive of malignancy. Electronically Signed   By: Abelardo Diesel M.D.   On: 08/06/2021 12:53  [place] AXILLARY NODE CORE BIOPSY RIGHT  Addendum Date: 08/23/2021   ADDENDUM REPORT: 08/23/2021 09:22 ADDENDUM: Pathology revealed A. 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 This was found to be concordant by Dr.  Franki Cabot. Pathology revealed B. 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 This was found to be concordant by Dr. Franki Cabot. Comment: The in situ carcinoma component in the left and right breast biopsies (parts A and B) has mixed ductal and lobular features. Definitive classification of the in situ component and grade of the invasive carcinomas will be assigned on the excisional specimen. Pathology revealed C. 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 This was found to be concordant by Dr. Franki Cabot. Pathology results were discussed with the patient and her husband Broadus John, by telephone. The patient reported doing well after the biopsies with tenderness at the sites. Post biopsy instructions and care were reviewed and questions were answered. The patient was encouraged to call [facility] for any additional concerns. Recommendation: If breast conservation is considered, there are indeterminate calcifications and asymmetry which extent 2.8 cm posterior-superior to the biopsied mass in the left breast. which will need additional stereotactic biopsy or wide/bracketed excision to ensure adequate excision. Alternatively, consider breast MRI given lobular histology and to define the extent of disease. Further recommendations will be guided by these decisions. Pathology results reported by Stacie Acres RN on 08/23/2021. Electronically Signed   By: Franki Cabot M.D.   On: 08/23/2021 09:22   Result Date: 08/23/2021 CLINICAL DATA:  Patient with highly suspicious masses within each breast for which patient presents today for bilateral ultrasound-guided biopsies. Patient also with 3 abnormal-appearing lymph nodes in the RIGHT  axilla, 1 of which will also be biopsied today. EXAM: ULTRASOUND GUIDED BILATERAL BREAST CORE NEEDLE BIOPSY ULTRASOUND-GUIDED RIGHT AXILLA CORE NEEDLE BIOPSY COMPARISON:  None Available. PROCEDURE: I met with the patient and we discussed the procedure of ultrasound-guided biopsy, including benefits and alternatives. We discussed the high likelihood of a successful procedure. We discussed the risks of the procedure, including infection, bleeding, tissue injury, clip migration, and inadequate sampling. Informed written consent was given. The usual time-out protocol was performed immediately prior to the procedure.  Site 1: Lesion quadrant: LEFT breast, 3 o'clock Using sterile technique and 1% Lidocaine as local anesthetic, under direct ultrasound visualization, a 12 gauge spring-loaded device was used to perform biopsy of the LEFT breast mass at the 3 o'clock axis using a lateral approach. At the conclusion of the procedure , a heart shaped tissue marker clip was deployed into the biopsy cavity. Site 2: Lesion quadrant: RIGHT breast, lower outer quadrant Using sterile technique and 1% Lidocaine as local anesthetic, under direct ultrasound visualization, a 12 gauge spring-loaded device was used to perform biopsy of the RIGHT breast mass at the 7 o'clock axis using a lateral approach. At the conclusion of the procedure , a coil shaped tissue marker clip was deployed into the biopsy cavity. Site 3: Lesion quadrant: RIGHT axilla Using sterile technique and 1% Lidocaine as local anesthetic, under direct ultrasound visualization, a 14 gauge spring-loaded device was used to perform biopsy of 1 of the enlarged/morphologically abnormal lymph nodes in the RIGHT axilla using a lateral approach. At the conclusion of the procedure HydroMARK spiral shaped tissue marker clip was deployed into the biopsy cavity. Follow up 2 view mammogram for each breast was performed and dictated separately. IMPRESSION: 1. Ultrasound guided biopsy of  the LEFT breast mass at the 3 o'clock axis. Heart shaped clip placed at the biopsy site. No apparent complications. 2. Ultrasound guided biopsy of the RIGHT breast mass at the 7 o'clock axis. Coil shaped clip placed at the biopsy site. No apparent complications. 3. Ultrasound guided biopsy of 1 of the enlarged/morphologically abnormal lymph nodes in the RIGHT axilla. HydroMARK spiral shaped clip placed at the biopsy site. No apparent complications. Electronically Signed: By: Franki Cabot M.D. On: 08/21/2021 09:19   MS DIGITAL DIAG TOMO BILAT  Result Date: 08/06/2021 CLINICAL DATA:  Palpable lump left breast EXAM: DIGITAL DIAGNOSTIC BILATERAL MAMMOGRAM WITH TOMOSYNTHESIS AND CAD; ULTRASOUND LEFT BREAST LIMITED; ULTRASOUND RIGHT BREAST LIMITED TECHNIQUE: Bilateral digital diagnostic mammography and breast tomosynthesis was performed. The images were evaluated with computer-aided detection.; Targeted ultrasound examination of the left breast was performed.; Targeted ultrasound examination of the right breast was performed COMPARISON:  Previous exam(s). ACR Breast Density Category c: The breast tissue is heterogeneously dense, which may obscure small masses. FINDINGS: Cc and MLO views of bilateral breasts, spot tangential view of left breast are submitted. At the palpable area lateral left breast, there is a spiculated mass with associated calcifications. In the slight lateral lower right breast, there is a spiculated mass. Targeted ultrasound is performed, showing angulated hypoechoic mass at palpable area left breast 3 o'clock cm from nipple measuring 1.7 x 1.7 x 1.6 cm correlating to. Ultrasound of the left axilla is negative. Ultrasound right breast demonstrates a 1 x 0.7 x 0.8 cm angulated spiculated mass at right breast 7 o'clock 5 cm from nipple correlating to the mammographic mass. Ultrasound of the right axilla demonstrates at 3 abnormal thickened cortex lymph node. IMPRESSION: Highly suspicious findings.  RECOMMENDATION: Recommend ultrasound-guided core biopsies x3 of bilateral breast mass and abnormal right axillary lymph node. I have discussed the findings and recommendations with the patient. If applicable, a reminder letter will be sent to the patient regarding the next appointment. BI-RADS CATEGORY  5: Highly suggestive of malignancy. Electronically Signed   By: Abelardo Diesel M.D.   On: 08/06/2021 12:53  MS CLIP PLACEMENT LEFT  Result Date: 08/21/2021 CLINICAL DATA:  Status post bilateral breast biopsies and a biopsy of a RIGHT axillary lymph node. EXAM: DIAGNOSTIC BILATERAL  MAMMOGRAM POST ULTRASOUND BIOPSY x3 COMPARISON:  Previous exam(s). FINDINGS: Mammographic images were obtained following ultrasound guided biopsy of a mass within the LEFT breast at the 3 o'clock axis, a mass within the RIGHT breast at the 7 o'clock axis, and a RIGHT axillary lymph node. The biopsy marking clips are in expected position at the sites of biopsy in each breast. The RIGHT axillary lymph node biopsy site is not able to be imaged mammographically. IMPRESSION: 1. Appropriate positioning of the heart shaped biopsy marking clip at the site of biopsy in the outer LEFT breast corresponding to the targeted mass in the LEFT breast at the 3 o'clock axis. There are indeterminate calcifications and asymmetry which extend approximately 2.8 cm posterior-superior to the biopsy site. 2. Appropriate positioning of the coil shaped biopsy marking clip at the site of biopsy in the lower outer quadrant of the RIGHT breast corresponding to the targeted mass in the RIGHT breast at the 7 o'clock axis. 3. HydroMARK spiral shaped clip within the RIGHT axillary lymph node is too far posterior-superior to see on today's mammogram, but appears appropriately positioned on today's postprocedure ultrasound. Final Assessment: Post Procedure Mammograms for Marker Placement Electronically Signed   By: Franki Cabot M.D.   On: 08/21/2021 14:01  MS CLIP  PLACEMENT RIGHT  Result Date: 08/21/2021 CLINICAL DATA:  Status post bilateral breast biopsies and a biopsy of a RIGHT axillary lymph node. EXAM: DIAGNOSTIC BILATERAL MAMMOGRAM POST ULTRASOUND BIOPSY x3 COMPARISON:  Previous exam(s). FINDINGS: Mammographic images were obtained following ultrasound guided biopsy of a mass within the LEFT breast at the 3 o'clock axis, a mass within the RIGHT breast at the 7 o'clock axis, and a RIGHT axillary lymph node. The biopsy marking clips are in expected position at the sites of biopsy in each breast. The RIGHT axillary lymph node biopsy site is not able to be imaged mammographically. IMPRESSION: 1. Appropriate positioning of the heart shaped biopsy marking clip at the site of biopsy in the outer LEFT breast corresponding to the targeted mass in the LEFT breast at the 3 o'clock axis. There are indeterminate calcifications and asymmetry which extend approximately 2.8 cm posterior-superior to the biopsy site. 2. Appropriate positioning of the coil shaped biopsy marking clip at the site of biopsy in the lower outer quadrant of the RIGHT breast corresponding to the targeted mass in the RIGHT breast at the 7 o'clock axis. 3. HydroMARK spiral shaped clip within the RIGHT axillary lymph node is to far posterior-superior to see on today's mammogram, but appears appropriately positioned on today's postprocedure ultrasound. Final Assessment: Post Procedure Mammograms for Marker Placement Electronically Signed   By: Franki Cabot M.D.   On: 08/21/2021 09:44  [place] LT BREAST BX W LOC DEV 1ST LESION IMG BX SPEC US GUIDE  Addendum Date: 08/23/2021   ADDENDUM REPORT: 08/23/2021 09:22 ADDENDUM: Pathology revealed A. 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 This was found to be concordant by Dr. Franki Cabot. Pathology revealed B. 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 This was found to be concordant by Dr. Franki Cabot. Comment: The in situ carcinoma component in the left and right breast biopsies (parts A and B) has mixed ductal and lobular features. Definitive classification of the in situ component and grade of the invasive carcinomas will  be assigned on the excisional specimen. Pathology revealed C. 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 This was found to be concordant by Dr. Franki Cabot. Pathology results were discussed with the patient and her husband Broadus John, by telephone. The patient reported doing well after the biopsies with tenderness at the sites. Post biopsy instructions and care were reviewed and questions were answered. The patient was encouraged to call [facility] for any additional concerns. Recommendation: If breast conservation is considered, there are indeterminate calcifications and asymmetry which extent 2.8 cm posterior-superior to the biopsied mass in the left breast. which will need additional stereotactic biopsy or wide/bracketed excision to ensure adequate excision. Alternatively, consider breast MRI given lobular histology and to define the extent of disease. Further recommendations will be guided by these decisions. Pathology results reported by Stacie Acres RN on 08/23/2021. Electronically Signed   By: Franki Cabot M.D.   On: 08/23/2021 09:22   Result Date: 08/23/2021 CLINICAL DATA:  Patient with highly suspicious masses within each breast for which patient presents today for bilateral ultrasound-guided biopsies. Patient also with 3 abnormal-appearing lymph nodes in the RIGHT axilla, 1 of which will also be biopsied today. EXAM:  ULTRASOUND GUIDED BILATERAL BREAST CORE NEEDLE BIOPSY ULTRASOUND-GUIDED RIGHT AXILLA CORE NEEDLE BIOPSY COMPARISON:  None Available. PROCEDURE: I met with the patient and we discussed the procedure of ultrasound-guided biopsy, including benefits and alternatives. We discussed the high likelihood of a successful procedure. We discussed the risks of the procedure, including infection, bleeding, tissue injury, clip migration, and inadequate sampling. Informed written consent was given. The usual time-out protocol was performed immediately prior to the procedure. Site 1: Lesion quadrant: LEFT breast, 3 o'clock Using sterile technique and 1% Lidocaine as local anesthetic, under direct ultrasound visualization, a 12 gauge spring-loaded device was used to perform biopsy of the LEFT breast mass at the 3 o'clock axis using a lateral approach. At the conclusion of the procedure , a heart shaped tissue marker clip was deployed into the biopsy cavity. Site 2: Lesion quadrant: RIGHT breast, lower outer quadrant Using sterile technique and 1% Lidocaine as local anesthetic, under direct ultrasound visualization, a 12 gauge spring-loaded device was used to perform biopsy of the RIGHT breast mass at the 7 o'clock axis using a lateral approach. At the conclusion of the procedure , a coil shaped tissue marker clip was deployed into the biopsy cavity. Site 3: Lesion quadrant: RIGHT axilla Using sterile technique and 1% Lidocaine as local anesthetic, under direct ultrasound visualization, a 14 gauge spring-loaded device was used to perform biopsy of 1 of the enlarged/morphologically abnormal lymph nodes in the RIGHT axilla using a lateral approach. At the conclusion of the procedure HydroMARK spiral shaped tissue marker clip was deployed into the biopsy cavity. Follow up 2 view mammogram for each breast was performed and dictated separately. IMPRESSION: 1. Ultrasound guided biopsy of the LEFT breast mass at the 3 o'clock axis. Heart  shaped clip placed at the biopsy site. No apparent complications. 2. Ultrasound guided biopsy of the RIGHT breast mass at the 7 o'clock axis. Coil shaped clip placed at the biopsy site. No apparent complications. 3. Ultrasound guided biopsy of 1 of the enlarged/morphologically abnormal lymph nodes in the RIGHT axilla. HydroMARK spiral shaped clip placed at the biopsy site. No apparent complications. Electronically Signed: By: Franki Cabot M.D. On: 08/21/2021 09:19  [place] RT BREAST BX W LOC DEV 1ST LESION IMG BX SPEC US GUIDE  Addendum Date: 08/23/2021   ADDENDUM REPORT: 08/23/2021 09:22 ADDENDUM: Pathology revealed A. 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 This was found to be concordant by Dr. Franki Cabot. Pathology revealed B. 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 This was found to be concordant by Dr. Franki Cabot. Comment: The in situ carcinoma component in the left and right breast biopsies (parts A and B) has mixed ductal and lobular features. Definitive classification of the in situ component and grade of the invasive carcinomas will be assigned on the excisional specimen. Pathology revealed C. 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 This was found to be concordant by Dr. Franki Cabot. Pathology results were discussed with the patient and her husband Broadus John, by telephone. The patient reported doing well after the biopsies with tenderness at the sites. Post biopsy instructions and care were reviewed and questions were answered. The patient was encouraged to call [facility] of [facility] for any additional concerns. Recommendation: If breast conservation is considered, there are indeterminate calcifications and asymmetry which extent 2.8 cm posterior-superior to the biopsied mass in the left breast. which will need additional stereotactic biopsy or wide/bracketed excision to ensure adequate excision. Alternatively, consider breast MRI given lobular histology and to define the extent of disease. Further recommendations will be guided by these decisions. Pathology results reported by Stacie Acres RN on 08/23/2021. Electronically Signed   By: Franki Cabot M.D.   On: 08/23/2021 09:22   Result Date: 08/23/2021 CLINICAL DATA:  Patient with highly suspicious masses within each breast for which patient presents today for bilateral ultrasound-guided biopsies. Patient also with 3 abnormal-appearing lymph nodes in the RIGHT axilla, 1 of which will also be biopsied today. EXAM: ULTRASOUND GUIDED BILATERAL BREAST CORE NEEDLE BIOPSY ULTRASOUND-GUIDED RIGHT AXILLA CORE NEEDLE BIOPSY COMPARISON:  None Available. PROCEDURE: I met with the patient and we discussed the procedure of ultrasound-guided biopsy, including benefits and alternatives. We discussed the high likelihood of a successful procedure. We discussed the risks of the procedure, including infection, bleeding, tissue injury, clip migration, and inadequate sampling. Informed written consent was given. The usual time-out protocol was performed immediately prior to the procedure. Site 1: Lesion quadrant: LEFT breast, 3 o'clock Using sterile technique and 1% Lidocaine as local anesthetic, under direct ultrasound visualization, a 12 gauge spring-loaded device was used to perform biopsy of the LEFT breast mass at the 3 o'clock axis using a lateral approach. At the conclusion of the procedure , a heart shaped tissue marker clip was deployed into the biopsy cavity. Site 2: Lesion quadrant: RIGHT breast, lower outer quadrant Using sterile technique and 1%  Lidocaine as local anesthetic, under direct ultrasound visualization, a 12 gauge spring-loaded device was used to perform biopsy of the RIGHT breast mass at the 7 o'clock axis using a lateral approach. At the conclusion of the procedure , a coil shaped tissue marker clip was deployed into the biopsy cavity. Site 3: Lesion quadrant: RIGHT axilla Using sterile technique and 1% Lidocaine as local anesthetic, under direct ultrasound visualization, a 14 gauge spring-loaded device was used to perform biopsy of  1 of the enlarged/morphologically abnormal lymph nodes in the RIGHT axilla using a lateral approach. At the conclusion of the procedure HydroMARK spiral shaped tissue marker clip was deployed into the biopsy cavity. Follow up 2 view mammogram for each breast was performed and dictated separately. IMPRESSION: 1. Ultrasound guided biopsy of the LEFT breast mass at the 3 o'clock axis. Heart shaped clip placed at the biopsy site. No apparent complications. 2. Ultrasound guided biopsy of the RIGHT breast mass at the 7 o'clock axis. Coil shaped clip placed at the biopsy site. No apparent complications. 3. Ultrasound guided biopsy of 1 of the enlarged/morphologically abnormal lymph nodes in the RIGHT axilla. HydroMARK spiral shaped clip placed at the biopsy site. No apparent complications. Electronically Signed: By: Franki Cabot M.D. On: 08/21/2021 09:19      ASSESSMENT & PLAN:  1. Malignant neoplasm of upper-outer quadrant of left breast in female, estrogen receptor positive ([place])   2. Malignant neoplasm of lower-outer quadrant of right breast of female, estrogen receptor positive ([place])   3. Severe major depression, single episode, with psychotic features, mood-congruent ([place])   4. Goals of care, counseling/discussion   5. 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 Check CT chest abdomen pelvis- Study showed some punctate foci of sclerosis in the included spine that could reflect subtle metastatic disease.  We will check bone scan.  If patient does have distant metastasis, she will need CDK 4/5 inhibitors plusAromatase inhibitors.  If no distant metastasis, I recommend MRI breast bilaterally with and without contrast for further evaluation of the extent of disease. Neoadjuvant chemotherapy may not be beneficial in carcinoma with lobular features as it is unlikely to transform her positive axillary to negative.  Unless MRI breast showed much bigger size of breast lesions not amendable for resection or breast conservation, I Recommend to proceed with surgery first. We will present patient's case on tumor board.  Check CBC, CMP, CA 27.29, CA 15.3 Refer to genetic counselor for genetic testing.  #History of major depression, psychosis Currently not on any Antidepressants.  Recommend patient to establish care with a psychiatrist and she agrees.  Referral sent.  #Thrombocytopenia, today's labs showed a platelet count of 94,000.  This is probably secondary to splenomegaly. 12/04/2020, platelet count 99,000.  Orders Placed This Encounter  Procedures   MR BREAST BILATERAL W WO CONTRAST INC CAD    Standing Status:   Future    Standing Expiration Date:   08/29/2022    Order Specific Question:   If indicated for the ordered procedure, I authorize the administration of contrast media per Radiology protocol    Answer:   Yes    Order Specific Question:   What is the patient's sedation requirement?    Answer:   No Sedation    Order Specific Question:   Does the patient have a pacemaker or implanted devices?    Answer:   No    Order Specific Question:   Radiology Contrast Protocol - do NOT remove file path    Answer:    [email or web address]    Order Specific Question:   Preferred imaging location?    Answer:   [facility] (table limit - 550lbs)   CT CHEST ABDOMEN PELVIS W CONTRAST    Standing Status:  Future    Number of Occurrences:   1    Standing Expiration Date:   08/29/2022    Order Specific Question:   Preferred imaging location?    Answer:   [facility]    Order Specific Question:   Is Oral Contrast requested for this exam?    Answer:   Yes, Per Radiology protocol    Order Specific Question:   Is patient pregnant?    Answer:   No   CBC with Differential/Platelet    Standing Status:   Future    Number of Occurrences:   1    Standing Expiration Date:   08/29/2022   Comprehensive metabolic panel    Standing Status:   Future    Number of Occurrences:   1    Standing Expiration Date:   08/28/2022   Cancer antigen 15-3    Standing Status:   Future    Number of Occurrences:   1    Standing Expiration Date:   08/29/2022   Cancer antigen 27.29    Standing Status:   Future    Number of Occurrences:   1    Standing Expiration Date:   08/29/2022   Ambulatory referral to Psychiatry    Referral Priority:   Routine    Referral Type:   Psychiatric    Referral Reason:   Specialty Services Required    Requested Specialty:   Psychiatry    Number of Visits Requested:   1   Ambulatory referral to Genetics    Referral Priority:   Routine    Referral Type:   Consultation    Referral Reason:   Specialty Services Required    Number of Visits Requested:   1    [name] questions or concerns.   Constant, Peggy, MD    Return of visit: To be determined Thank you for this kind referral and the opportunity to participate in the care of this patient. A copy of today's note is routed to referring provider   Earlie Server, MD, PhD [facility] Hematology Oncology 08/28/2021

## 2021-08-29 ENCOUNTER — Encounter: Payer: Self-pay | Admitting: Surgery

## 2021-08-29 ENCOUNTER — Telehealth: Payer: Self-pay

## 2021-08-29 ENCOUNTER — Ambulatory Visit: Payer: Medicaid Other | Admitting: Surgery

## 2021-08-29 ENCOUNTER — Other Ambulatory Visit: Payer: Self-pay

## 2021-08-29 ENCOUNTER — Ambulatory Visit
Admission: RE | Admit: 2021-08-29 | Discharge: 2021-08-29 | Disposition: A | Discharge status: Home / Self Care | Payer: Medicaid Other | Source: Ambulatory Visit | Attending: Oncology | Admitting: Oncology

## 2021-08-29 VITALS — BP 171/98 | HR 77 | Temp 98.4°F | Ht 61.0 in | Wt 182.8 lb

## 2021-08-29 DIAGNOSIS — Z170 Estrogen receptor positive status [ER+]: Secondary | ICD-10-CM

## 2021-08-29 DIAGNOSIS — C50511 Malignant neoplasm of lower-outer quadrant of right female breast: Secondary | ICD-10-CM

## 2021-08-29 DIAGNOSIS — C50812 Malignant neoplasm of overlapping sites of left female breast: Secondary | ICD-10-CM | POA: Diagnosis not present

## 2021-08-29 DIAGNOSIS — C50412 Malignant neoplasm of upper-outer quadrant of left female breast: Secondary | ICD-10-CM | POA: Insufficient documentation

## 2021-08-29 DIAGNOSIS — Z7189 Other specified counseling: Secondary | ICD-10-CM

## 2021-08-29 LAB — CANCER ANTIGEN 27.29: CA 27.29: 45.4 U/mL — ABNORMAL HIGH (ref 0.0–38.6)

## 2021-08-29 LAB — CANCER ANTIGEN 15-3: CA 15-3: 35.5 U/mL — ABNORMAL HIGH (ref 0.0–25.0)

## 2021-08-29 MED ORDER — GADOBUTROL 1 MMOL/ML IV SOLN
7.5000 mL | Freq: Once | INTRAVENOUS | Status: AC | PRN
  Administered 2021-08-29: 7.5 mL via INTRAVENOUS

## 2021-08-29 NOTE — Telephone Encounter (Signed)
Patient unavailable, spoke to husband and informed him of MD recommendation. Informed him that scheduling will reach out to them with appt details once scan is scheduled.

## 2021-08-29 NOTE — Patient Instructions (Signed)
Please see your follow up appointment listed below.  °

## 2021-08-29 NOTE — Progress Notes (Signed)
Patient ID: Sharon Woods, female   DOB: 01-23-1961, 61 y.o.   MRN: [id number]  Chief Complaint: Bilateral breast cancer, right axillary nodal positivity.  History of Present Illness Sharon Woods is a 61 y.o. female with invasive mammary carcinoma of bilateral breasts, with suspicious right axillary lymphadenopathy on ultrasound, biopsy confirmed positive for invasive mammary carcinoma with lobular features.  She is ER/PR positive, HER2 negative.  She reports her sister had breast cancer and is now deceased.  She is gravida 3 para 2, SAB 1.  She was first pregnant at the age of 4, now with children aged 33 and 18.  She felt a lump in her left breast, noted some dimpling/deformity of her left breast but denies any breast pain.  She reports having a total hysterectomy with bilateral oophorectomy in 2012.  She reports having utilized both birth control and hormonal replacement therapy. She was seen by Dr. Tasia Catchings yesterday, she has an MRI of the breast pending this afternoon. Dr. Tasia Catchings does not anticipate neoadjuvant therapy to diminish nodal burden, and suspects best to proceed with surgery upfront.  Past Medical History Past Medical History:  Diagnosis Date   History of high cholesterol 2000   Hypertension    Patient denies medical problems       Past Surgical History:  Procedure Laterality Date   ABDOMINAL HYSTERECTOMY     BREAST BIOPSY Right 08/21/2021   [place] bx 7:00 mass coil clip path pending   BREAST BIOPSY Right 08/21/2021   [place] bx of LN, hydro marker, path pending   BREAST BIOPSY Left 08/21/2021   [place] bx, heart marker, path pending   CESAREAN SECTION     x2   NO PAST SURGERIES      No Known Allergies  Current Outpatient Medications  Medication Sig Dispense Refill   calcium carbonate (TUMS - DOSED IN MG ELEMENTAL CALCIUM) 500 MG chewable tablet Chew 1 tablet by mouth daily.     metroNIDAZOLE (FLAGYL) 500 MG tablet Take 1 tablet (500 mg total) by mouth 2 (two) times daily. 14 tablet  0   No current facility-administered medications for this visit.    Family History Family History  Problem Relation Age of Onset   Lung cancer Mother    Dementia Mother    Parkinson's disease Father    Diabetes Sister    Heart attack Sister    Breast cancer Sister    Dementia Maternal Grandmother    Cancer Other    Dementia Other       Social History Social History   Tobacco Use   Smoking status: Former    Types: Cigarettes    Quit date: 2013    Years since quitting: 10.4   Smokeless tobacco: Never  Vaping Use   Vaping Use: Former  Substance Use Topics   Alcohol use: No    Alcohol/week: 0.0 standard drinks   Drug use: No        Review of Systems  Constitutional: Negative.   HENT: Negative.    Eyes: Negative.   Respiratory: Negative.    Cardiovascular: Negative.   Gastrointestinal:  Positive for constipation, diarrhea, heartburn and nausea.  Genitourinary: Negative.   Skin:  Positive for itching and rash.  Neurological: Negative.   Psychiatric/Behavioral: Negative.       Physical Exam Blood pressure (!) 171/98, pulse 77, temperature 98.4 F (36.9 C), temperature source Oral, height 5' 1"  (1.549 m), weight 182 lb 12.8 oz (82.9 kg), SpO2 98 %. Last Weight  Most recent update: 08/29/2021 10:36 AM    Weight  82.9 kg (182 lb 12.8 oz)             CONSTITUTIONAL: Well developed, and nourished, appropriately responsive and aware without distress.  I am concerned that cognitively she is not fully grasping all that has been shared with her, and fully appreciating the challenges of future treatment.  Her husband seems quite attentive and helping her with explanations and logistics. EYES: Sclera non-icteric.   EARS, NOSE, MOUTH AND THROAT:  The oropharynx is clear. Oral mucosa is pink and moist.  Dentition: Poor.   Hearing is intact to voice.  NECK: Trachea is midline, and there is no jugular venous distension.  LYMPH NODES:  Lymph nodes in the neck are not  enlarged. RESPIRATORY:  Lungs are clear, and breath sounds are equal bilaterally. Normal respiratory effort without pathologic use of accessory muscles. CARDIOVASCULAR: Heart is regular in rate and rhythm. GI: The abdomen is  soft, nontender, and nondistended. There were no palpable masses. I did not appreciate hepatosplenomegaly. There were normal bowel sounds. GU: Sharon Woods is present as chaperone: Left breast is notable for a fold in the outer lower quadrant consistent with some degree of tethering.  It is now remarkably bruised and there is a vague mass effect from the ecchymotic tissues. I find no appreciable palpable mass in the right breast despite compensating for the biopsy site, exam is limited due to tenderness.  There are Steri-Strips present in the axilla as well.  But there is no evidence of skin tethering or changes in the right breast.  Skin appears grossly normal on the left aside from the ecchymotic changes. MUSCULOSKELETAL:  Symmetrical muscle tone appreciated in all four extremities.    SKIN: Skin turgor is normal. No pathologic skin lesions appreciated.  NEUROLOGIC:  Motor and sensation appear grossly normal.  Cranial nerves are grossly without defect. PSYCH:  Alert and oriented to person, place and time. Affect is appropriate for situation.  Data Reviewed I have personally reviewed what is currently available of the patient's imaging, recent labs and medical records.   Labs:     Latest Ref Rng & Units 08/28/2021   11:43 AM 12/04/2020    3:27 PM 11/15/2015    2:41 PM  CBC  WBC 4.0 - 10.5 K/uL 4.7   4.7   8.7    Hemoglobin 12.0 - 15.0 g/dL 14.5   14.3   15.9    Hematocrit 36.0 - 46.0 % 43.3   42.1   45.5    Platelets 150 - 400 K/uL 94   99   207        Latest Ref Rng & Units 08/28/2021   11:43 AM 12/04/2020    3:27 PM 11/15/2015    2:41 PM  CMP  Glucose 70 - 99 mg/dL 271   284   140    BUN 6 - 20 mg/dL 11   9   20     Creatinine 0.44 - 1.00 mg/dL 0.67   0.64   0.85     Sodium 135 - 145 mmol/L 136   139   142    Potassium 3.5 - 5.1 mmol/L 3.7   3.5   3.6    Chloride 98 - 111 mmol/L 103   103   105    CO2 22 - 32 mmol/L 25   29   25     Calcium 8.9 - 10.3 mg/dL 9.0   8.8  9.9    Total Protein 6.5 - 8.1 g/dL 8.2   7.6   8.0    Total Bilirubin 0.3 - 1.2 mg/dL 0.6   1.0   0.7    Alkaline Phos 38 - 126 U/L 83   78   67    AST 15 - 41 U/L 30   34   29    ALT 0 - 44 U/L 25   35   20     SURGICAL PATHOLOGY  * THIS IS AN ADDENDUM REPORT *  CASE: [id number]  PATIENT: Tyrell Nooney  Surgical Pathology Report  *Addendum *   Reason for Addendum #1:  Breast Biomarker Results   Specimen Submitted:  A. Breast, left  B. Breast, right  C. Lymph node, right axilla   Clinical History: Bilateral highly suspicious breast masses (1.7 cm left  breast and 1.0 cm right breast) and multiple enlarged/morphologically  abnormal lymph nodes in the right axilla. 1: left breast 3:00 (heart).  2: right breast 7:00 (coil). 3: right axillary lymph node ([facility])       DIAGNOSIS:  A.  BREAST MASS, LEFT 3:00 6 CM FN; ULTRASOUND-GUIDED BIOPSY:  - INVASIVE MAMMARY CARCINOMA WITH LOBULAR FEATURES.  - IN SITU CARCINOMA, SEE COMMENT.  - LOBULAR NEOPLASIA.  Size of invasive carcinoma: 6 mm in this sample  Histologic grade of invasive carcinoma: Grade 2                       Glandular/tubular differentiation score: 3                       Nuclear pleomorphism score: 2                       Mitotic rate score: 1                       Total score: 6  Lymphovascular invasion: Not identified   B.  BREAST MASS, RIGHT 7:00 5 CM FN; ULTRASOUND-GUIDED BIOPSY:  - INVASIVE MAMMARY CARCINOMA WITH LOBULAR FEATURES.  - IN SITU CARCINOMA, SEE COMMENT.  Size of invasive carcinoma: 13 mm in this sample  Histologic grade of invasive carcinoma: Grade 2                       Glandular/tubular differentiation score: 3                       Nuclear pleomorphism score: 2                        Mitotic rate score: 1                       Total score: 6  Lymphovascular invasion: Not identified   C.  LYMPH NODE, RIGHT AXILLA; ULTRASOUND-GUIDED BIOPSY:  - METASTATIC MAMMARY CARCINOMA, LARGEST FOCUS MEASURING 11 MM.  - EXTRACAPSULAR EXTENSION IS PRESENT.   Comment:  The in situ carcinoma component in the left and right breast biopsies  (parts A and B) has mixed ductal and lobular features. Definitive  classification of the in situ component and grade of the invasive  carcinomas will be assigned on the excisional specimen.  ER/PR/HER2: Immunohistochemistry will be performed on blocks A1 and B1,  with reflex to [facility] for HER2 2+. The results will be reported  in an  addendum.   GROSS DESCRIPTION:  A. Labeled: Left breast 3:00 6 cm from the nipple  Received: Formalin  Time/date in fixative: Collected and placed in formalin at 8:15 AM on  08/21/2021  Cold ischemic time: Less than 1 minute  Total fixation time: Approximately 9 hours  Core pieces: 3  Size: Ranges from 0.7-1.0 cm in length and 0.1 cm in diameter  Description: Pink to yellow cores of fibrofatty tissue  Ink color: Green  Entirely submitted in 1 cassette.   B. Labeled: Right breast 7:00 5 cm from the nipple  Received: Formalin  Time/date in fixative: Collected and placed in formalin at 8:45 AM on  08/21/2021  Cold ischemic time: Less than 1 minute  Total fixation time: Approximately 8.5 hours  Core pieces: 3  Size: Ranges from 1.1-1.5 cm in length and 0.1 cm in diameter  Description: Pink to yellow cores of fibrofatty tissue  Ink color: Black  Entirely submitted in 1 cassette.   C. Labeled: Right axillary lymph node  Received: Formalin  Time/date in fixative: Collected and placed in formalin at 9:00 AM on  08/21/2021  Cold ischemic time: Less than 1 minute  Total fixation time: Approximately 8.4 hours  Core pieces: 3 plus additional fragments  Size: Ranges from 0.9-1.3 cm in length and 0.1 cm in diameter   Description: Tan cores of soft tissue  Ink color: Blue  Entirely submitted in 1 cassette.   CM 08/21/2021   Final Diagnosis performed by Quay Burow, MD.   Electronically signed  08/22/2021 10:14:58AM  The electronic signature indicates that the named Attending Pathologist  has evaluated the specimen  Technical component performed at [facility], [address] [place],  [place] [address] Lab: [phone] Dir: Rush Farmer, MD, MMM   Professional component performed at [facility], [facility], [place], [place], [place] [address] Lab: [phone]  Dir: Kathi Simpers, MD   ADDENDUM:  CASE SUMMARY: BREAST BIOMARKER TESTS - Part A.  LEFT 3:00 6 CM FN  (heart):  Estrogen Receptor (ER) Status: POSITIVE          Percentage of cells with nuclear positivity: Greater than 90%          Average intensity of staining: Strong   Progesterone Receptor (PgR) Status: POSITIVE          Percentage of cells with nuclear positivity: Greater than 90%          Average intensity of staining: Moderate   HER2 (by immunohistochemistry): NEGATIVE (1+)   Ki-67: Not performed   CASE SUMMARY: BREAST BIOMARKER TESTS - Part B. RIGHT 7:00 5 CM FN  (coil):  Estrogen Receptor (ER) Status: POSITIVE          Percentage of cells with nuclear positivity: Greater than 90%          Average intensity of staining: Strong   Progesterone Receptor (PgR) Status: POSITIVE          Percentage of cells with nuclear positivity: 51 to 90%          Average intensity of staining: Moderate   HER2 (by immunohistochemistry): NEGATIVE (1+)   Ki-67: Not performed   Cold Ischemia and Fixation Times: Meet requirements specified in latest  version of the ASCO/CAP guidelines  Testing Performed on Block Number(s): A1 and B1    Imaging: CLINICAL DATA:  Palpable lump left breast   EXAM: DIGITAL DIAGNOSTIC BILATERAL MAMMOGRAM WITH TOMOSYNTHESIS AND CAD; ULTRASOUND LEFT BREAST LIMITED; ULTRASOUND RIGHT  BREAST LIMITED   TECHNIQUE: Bilateral digital diagnostic mammography and breast tomosynthesis was performed. The images were evaluated with computer-aided detection.; Targeted ultrasound examination of the left breast was performed.; Targeted ultrasound examination of the right breast was performed   COMPARISON:  Previous exam(s).   ACR Breast Density Category c: The breast tissue is heterogeneously dense, which may obscure small masses.   FINDINGS: Cc and MLO views of bilateral breasts, spot tangential view of left breast are submitted. At the palpable area lateral left breast, there is a spiculated mass with associated calcifications. In the slight lateral lower right breast, there is a spiculated mass.   Targeted ultrasound is performed, showing angulated hypoechoic mass at palpable area left breast 3 o'clock cm from nipple measuring 1.7 x 1.7 x 1.6 cm correlating to. Ultrasound of the left axilla is negative.   Ultrasound right breast demonstrates a 1 x 0.7 x 0.8 cm angulated spiculated mass at right breast 7 o'clock 5 cm from nipple correlating to the mammographic mass. Ultrasound of the right axilla demonstrates at 3 abnormal thickened cortex lymph node.   IMPRESSION: Highly suspicious findings.   RECOMMENDATION: Recommend ultrasound-guided core biopsies x3 of bilateral breast mass and abnormal right axillary lymph node.   I have discussed the findings and recommendations with the patient. If applicable, a reminder letter will be sent to the patient regarding the next appointment.   BI-RADS CATEGORY  5: Highly suggestive of malignancy.     Electronically Signed   By: Abelardo Diesel M.D.   On: 08/06/2021 12:53   Within last 24 hrs: CT CHEST ABDOMEN PELVIS W CONTRAST  Result Date: 08/28/2021 CLINICAL DATA:  Breast cancer EXAM: CT CHEST, ABDOMEN, AND PELVIS WITH CONTRAST TECHNIQUE: Multidetector CT imaging of the chest, abdomen and pelvis was performed following  the standard protocol during bolus administration of intravenous contrast. RADIATION DOSE REDUCTION: This exam was performed according to the departmental dose-optimization program which includes automated exposure control, adjustment of the mA and/or kV according to patient size and/or use of iterative reconstruction technique. CONTRAST:  110m OMNIPAQUE IOHEXOL 300 MG/ML  SOLN COMPARISON:  CT abdomen 12/04/2020 FINDINGS: CT CHEST FINDINGS Cardiovascular: Normal heart size. No pericardial effusion. Thoracic aorta is normal in caliber. Mediastinum/Nodes: Included thyroid is unremarkable. Esophagus is unremarkable. No enlarged mediastinal or hilar nodes. Asymmetric right axillary nodes with mild enlargement. Lungs/Pleura: No consolidation or mass. Left lower lobe linear atelectasis/scarring. No pleural effusion. Musculoskeletal: No aggressive osseous lesion. However, there are a few scattered punctate foci of sclerosis. Chronic mild loss of height at the T8 superior endplate. CT ABDOMEN PELVIS FINDINGS Hepatobiliary: No focal liver abnormality. Gallbladder is unremarkable. No biliary dilatation. Pancreas: Unremarkable. Spleen: Similar mild splenomegaly. Adrenals/Urinary Tract: Adrenals are unremarkable. Too small to characterize low-density renal lesions. 4 mm nonobstructing calculus of the left kidney. Punctate nonobstructing right renal calculi. Bladder is unremarkable. Stomach/Bowel: Stomach is within normal limits. Bowel is normal in caliber. Normal appendix. Vascular/Lymphatic: Question of portal hypertension as before. No enlarged nodes. Reproductive: Status post hysterectomy. No adnexal masses. Other: No free fluid.  Abdominal wall is unremarkable. Musculoskeletal: Degenerative changes of the lumbar spine. No aggressive osseous lesion. Few punctate foci of sclerosis are new from the prior study. For example, at the anterior aspect of L5. IMPRESSION: Asymmetric mildly enlarged right axillary nodes. Correlate  with biopsy. There are some punctate foci of sclerosis in the included spine that could reflect subtle metastatic disease. Nonobstructing bilateral renal calculi. Electronically Signed   By: PMacy Mis  M.D.   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 Based on the size of the lesions alone this should be amenable to breast conservation treatment, however I am concerned about the overall follow-through of this patient and her family with the burden of presenting for regular radiation treatments for breast conservation.  She seems to prefer this approach and conserving the breasts. We will clearly have to defer to the MRI as far as risks of positive margins with attempted lumpectomy. If there is no role for neoadjuvant, I suspect we will have at least 3 positive lymph nodes based on imaging, and may be wise to just proceed with a right axillary dissection, question the value of a left sentinel lymph node biopsy.  Anticipate discussion breast cancer conference on the 12th. Patient Active Problem List   Diagnosis Date Noted   Malignant neoplasm of upper-outer quadrant of left breast in female, estrogen receptor positive ([place]) 08/28/2021   Malignant neoplasm of lower-outer quadrant of right breast of female, estrogen receptor positive ([place]) 08/28/2021   Goals of care, counseling/discussion 08/28/2021   Severe recurrent major depression with psychotic features ([place]) 11/15/2015   Hypertension 11/15/2015   Noncompliance 11/15/2015   Severe major depression, single episode, with psychotic features, mood-congruent ([place]) 03/29/2015   Protein-calorie malnutrition, severe 03/26/2015   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    Plan    I scheduled her for follow-up in the office on the 13th.  I will follow-up her MRI results from later today. I believe it would be prudent to have them meet with Dr. Baruch Gouty before surgery and his assessment if breast conservation  remains viable considering their ability to follow through with radiation therapy.  Face-to-face time spent with the patient and accompanying care providers(if present) was 45 minutes, with more than 50% of the time spent counseling, educating, and coordinating care of the patient.    These notes generated with voice recognition software. I apologize for typographical errors.  [name] M.D., FACS 08/29/2021, 11:15 AM

## 2021-08-29 NOTE — Telephone Encounter (Signed)
Oncotype Dx testing submitted online on specimen [id number].   Order: [id number]

## 2021-08-29 NOTE — Telephone Encounter (Signed)
-----   Message from Earlie Server, MD sent at 08/28/2021  7:33 PM EDT ----- Please send Oncotype DX on CASE: [id number], please specify that I am recommend both A and B specimen to be submitted for testing.

## 2021-08-29 NOTE — Telephone Encounter (Signed)
-----   Message from Earlie Server, MD sent at 08/28/2021  7:32 PM EDT ----- Please let patient know that CT scan showed some questionable findings in the spine. I recommend bone scan ASAP for further evaluation.  Please arrange.

## 2021-09-04 ENCOUNTER — Encounter
Admission: RE | Admit: 2021-09-04 | Discharge: 2021-09-04 | Disposition: A | Discharge status: Home / Self Care | Payer: Medicaid Other | Source: Ambulatory Visit | Attending: Oncology | Admitting: Oncology

## 2021-09-04 DIAGNOSIS — C50412 Malignant neoplasm of upper-outer quadrant of left female breast: Secondary | ICD-10-CM | POA: Insufficient documentation

## 2021-09-04 DIAGNOSIS — Z170 Estrogen receptor positive status [ER+]: Secondary | ICD-10-CM | POA: Diagnosis not present

## 2021-09-04 MED ORDER — TECHNETIUM TC 99M MEDRONATE IV KIT
20.0000 | PACK | Freq: Once | INTRAVENOUS | Status: AC | PRN
  Administered 2021-09-04: 19.11 via INTRAVENOUS

## 2021-09-05 ENCOUNTER — Encounter: Payer: Self-pay | Admitting: *Deleted

## 2021-09-05 NOTE — Progress Notes (Signed)
Patient called with questions regarding upcoming appointments and next steps.   I explained to her that Dr. Tasia Catchings is waiting on the oncotype results to see what her next steps are.   Patient has appt. With Dr. Christian Mate on Tuesday and that appt. Info was given to patient.  She knows to call if she has any other questions or concerns.

## 2021-09-08 ENCOUNTER — Encounter: Payer: Self-pay | Admitting: Oncology

## 2021-09-08 DIAGNOSIS — D696 Thrombocytopenia, unspecified: Secondary | ICD-10-CM | POA: Insufficient documentation

## 2021-09-10 ENCOUNTER — Other Ambulatory Visit: Payer: Self-pay

## 2021-09-10 ENCOUNTER — Encounter: Payer: Self-pay | Admitting: Oncology

## 2021-09-10 ENCOUNTER — Encounter: Payer: Self-pay | Admitting: Surgery

## 2021-09-10 ENCOUNTER — Ambulatory Visit: Payer: Medicaid Other | Admitting: Surgery

## 2021-09-10 VITALS — BP 145/80 | HR 78 | Temp 98.6°F | Wt 179.8 lb

## 2021-09-10 DIAGNOSIS — Z170 Estrogen receptor positive status [ER+]: Secondary | ICD-10-CM

## 2021-09-10 DIAGNOSIS — C50412 Malignant neoplasm of upper-outer quadrant of left female breast: Secondary | ICD-10-CM | POA: Diagnosis not present

## 2021-09-10 DIAGNOSIS — C50511 Malignant neoplasm of lower-outer quadrant of right female breast: Secondary | ICD-10-CM

## 2021-09-10 DIAGNOSIS — Z7189 Other specified counseling: Secondary | ICD-10-CM | POA: Diagnosis not present

## 2021-09-10 NOTE — Progress Notes (Signed)
Patient ID: Sharon Woods, female   DOB: 1960/07/16, 61 y.o.   MRN: [id number]  Chief Complaint: Bilateral breast cancer, right axillary nodal positivity.  History of Present Illness Sharon Woods is a 61 y.o. female with invasive mammary carcinoma of bilateral breasts, with suspicious right axillary lymphadenopathy on ultrasound, biopsy confirmed positive for invasive mammary carcinoma with lobular features.  She is ER/PR positive, HER2 negative.  She reports her sister had breast cancer and is now deceased.  She is gravida 3 para 2, SAB 1.  She was first pregnant at the age of 32, now with children aged 1 and 29.  She felt a lump in her left breast, noted some dimpling/deformity of her left breast but denies any breast pain.  She reports having a total hysterectomy with bilateral oophorectomy in 2012.  She reports having utilized both birth control and hormonal replacement therapy. She was seen by Dr. Tasia Catchings yesterday, she has an MRI of the breast pending this afternoon. Dr. Tasia Catchings does not anticipate neoadjuvant therapy to diminish nodal burden, and suspects best to proceed with surgery upfront.  Past Medical History Past Medical History:  Diagnosis Date  . History of high cholesterol 2000  . Hypertension   . Patient denies medical problems       Past Surgical History:  Procedure Laterality Date  . ABDOMINAL HYSTERECTOMY    . BREAST BIOPSY Right 08/21/2021   [place] bx 7:00 mass coil clip path pending  . BREAST BIOPSY Right 08/21/2021   [place] bx of LN, hydro marker, path pending  . BREAST BIOPSY Left 08/21/2021   [place] bx, heart marker, path pending  . CESAREAN SECTION     x2  . NO PAST SURGERIES      No Known Allergies  Current Outpatient Medications  Medication Sig Dispense Refill  . calcium carbonate (TUMS - DOSED IN MG ELEMENTAL CALCIUM) 500 MG chewable tablet Chew 1 tablet by mouth daily.    . metroNIDAZOLE (FLAGYL) 500 MG tablet Take 1 tablet (500 mg total) by mouth 2 (two) times daily.  14 tablet 0   No current facility-administered medications for this visit.    Family History Family History  Problem Relation Age of Onset  . Lung cancer Mother   . Dementia Mother   . Parkinson's disease Father   . Diabetes Sister   . Heart attack Sister   . Breast cancer Sister   . Dementia Maternal Grandmother   . Cancer Other   . Dementia Other       Social History Social History   Tobacco Use  . Smoking status: Former    Types: Cigarettes    Quit date: 2013    Years since quitting: 10.4  . Smokeless tobacco: Never  Vaping Use  . Vaping Use: Former  Substance Use Topics  . Alcohol use: No    Alcohol/week: 0.0 standard drinks of alcohol  . Drug use: No        Review of Systems  Constitutional: Negative.   HENT: Negative.    Eyes: Negative.   Respiratory: Negative.    Cardiovascular: Negative.   Gastrointestinal:  Positive for constipation, diarrhea, heartburn and nausea.  Genitourinary: Negative.   Skin:  Positive for itching and rash.  Neurological: Negative.   Psychiatric/Behavioral: Negative.        Physical Exam There were no vitals taken for this visit.    CONSTITUTIONAL: Well developed, and nourished, appropriately responsive and aware without distress.  I am concerned that cognitively  she is not fully grasping all that has been shared with her, and fully appreciating the challenges of future treatment.  Her husband seems quite attentive and helping her with explanations and logistics. EYES: Sclera non-icteric.   EARS, NOSE, MOUTH AND THROAT:  The oropharynx is clear. Oral mucosa is pink and moist.  Dentition: Poor.   Hearing is intact to voice.  NECK: Trachea is midline, and there is no jugular venous distension.  LYMPH NODES:  Lymph nodes in the neck are not enlarged. RESPIRATORY:  Lungs are clear, and breath sounds are equal bilaterally. Normal respiratory effort without pathologic use of accessory muscles. CARDIOVASCULAR: Heart is regular  in rate and rhythm. GI: The abdomen is  soft, nontender, and nondistended. There were no palpable masses. I did not appreciate hepatosplenomegaly. There were normal bowel sounds. GU: Freda Munro is present as chaperone: Left breast is notable for a fold in the outer lower quadrant consistent with some degree of tethering.  It is now remarkably bruised and there is a vague mass effect from the ecchymotic tissues. I find no appreciable palpable mass in the right breast despite compensating for the biopsy site, exam is limited due to tenderness.  There are Steri-Strips present in the axilla as well.  But there is no evidence of skin tethering or changes in the right breast.  Skin appears grossly normal on the left aside from the ecchymotic changes. MUSCULOSKELETAL:  Symmetrical muscle tone appreciated in all four extremities.    SKIN: Skin turgor is normal. No pathologic skin lesions appreciated.  NEUROLOGIC:  Motor and sensation appear grossly normal.  Cranial nerves are grossly without defect. PSYCH:  Alert and oriented to person, place and time. Affect is appropriate for situation.  Data Reviewed I have personally reviewed what is currently available of the patient's imaging, recent labs and medical records.   Labs:     Latest Ref Rng & Units 08/28/2021   11:43 AM 12/04/2020    3:27 PM 11/15/2015    2:41 PM  CBC  WBC 4.0 - 10.5 K/uL 4.7  4.7  8.7   Hemoglobin 12.0 - 15.0 g/dL 14.5  14.3  15.9   Hematocrit 36.0 - 46.0 % 43.3  42.1  45.5   Platelets 150 - 400 K/uL 94  99  207       Latest Ref Rng & Units 08/28/2021   11:43 AM 12/04/2020    3:27 PM 11/15/2015    2:41 PM  CMP  Glucose 70 - 99 mg/dL 271  284  140   BUN 6 - 20 mg/dL 11  9  20    Creatinine 0.44 - 1.00 mg/dL 0.67  0.64  0.85   Sodium 135 - 145 mmol/L 136  139  142   Potassium 3.5 - 5.1 mmol/L 3.7  3.5  3.6   Chloride 98 - 111 mmol/L 103  103  105   CO2 22 - 32 mmol/L 25  29  25    Calcium 8.9 - 10.3 mg/dL 9.0  8.8  9.9   Total  Protein 6.5 - 8.1 g/dL 8.2  7.6  8.0   Total Bilirubin 0.3 - 1.2 mg/dL 0.6  1.0  0.7   Alkaline Phos 38 - 126 U/L 83  78  67   AST 15 - 41 U/L 30  34  29   ALT 0 - 44 U/L 25  35  20    SURGICAL PATHOLOGY  * THIS IS AN ADDENDUM REPORT *  CASE: [id number]  PATIENT: [facility]  Surgical Pathology Report  *Addendum *   Reason for Addendum #1:  Breast Biomarker Results   Specimen Submitted:  A. Breast, left  B. Breast, right  C. Lymph node, right axilla   Clinical History: Bilateral highly suspicious breast masses (1.7 cm left  breast and 1.0 cm right breast) and multiple enlarged/morphologically  abnormal lymph nodes in the right axilla. 1: left breast 3:00 (heart).  2: right breast 7:00 (coil). 3: right axillary lymph node (HydroMARK)       DIAGNOSIS:  A.  BREAST MASS, LEFT 3:00 6 CM FN; ULTRASOUND-GUIDED BIOPSY:  - INVASIVE MAMMARY CARCINOMA WITH LOBULAR FEATURES.  - IN SITU CARCINOMA, SEE COMMENT.  - LOBULAR NEOPLASIA.  Size of invasive carcinoma: 6 mm in this sample  Histologic grade of invasive carcinoma: Grade 2                       Glandular/tubular differentiation score: 3                       Nuclear pleomorphism score: 2                       Mitotic rate score: 1                       Total score: 6  Lymphovascular invasion: Not identified   B.  BREAST MASS, RIGHT 7:00 5 CM FN; ULTRASOUND-GUIDED BIOPSY:  - INVASIVE MAMMARY CARCINOMA WITH LOBULAR FEATURES.  - IN SITU CARCINOMA, SEE COMMENT.  Size of invasive carcinoma: 13 mm in this sample  Histologic grade of invasive carcinoma: Grade 2                       Glandular/tubular differentiation score: 3                       Nuclear pleomorphism score: 2                       Mitotic rate score: 1                       Total score: 6  Lymphovascular invasion: Not identified   C.  LYMPH NODE, RIGHT AXILLA; ULTRASOUND-GUIDED BIOPSY:  - METASTATIC MAMMARY CARCINOMA, LARGEST FOCUS MEASURING 11 MM.  -  EXTRACAPSULAR EXTENSION IS PRESENT.   Comment:  The in situ carcinoma component in the left and right breast biopsies  (parts A and B) has mixed ductal and lobular features. Definitive  classification of the in situ component and grade of the invasive  carcinomas will be assigned on the excisional specimen.  ER/PR/HER2: Immunohistochemistry will be performed on blocks A1 and B1,  with reflex to [place] for HER2 2+. The results will be reported in an  addendum.   GROSS DESCRIPTION:  A. Labeled: Left breast 3:00 6 cm from the nipple  Received: Formalin  Time/date in fixative: Collected and placed in formalin at 8:15 AM on  08/21/2021  Cold ischemic time: Less than 1 minute  Total fixation time: Approximately 9 hours  Core pieces: 3  Size: Ranges from 0.7-1.0 cm in length and 0.1 cm in diameter  Description: Pink to yellow cores of fibrofatty tissue  Ink color: Green  Entirely submitted in 1 cassette.   B. Labeled: Right breast 7:00 5 cm  from the nipple  Received: Formalin  Time/date in fixative: Collected and placed in formalin at 8:45 AM on  08/21/2021  Cold ischemic time: Less than 1 minute  Total fixation time: Approximately 8.5 hours  Core pieces: 3  Size: Ranges from 1.1-1.5 cm in length and 0.1 cm in diameter  Description: Pink to yellow cores of fibrofatty tissue  Ink color: Black  Entirely submitted in 1 cassette.   C. Labeled: Right axillary lymph node  Received: Formalin  Time/date in fixative: Collected and placed in formalin at 9:00 AM on  08/21/2021  Cold ischemic time: Less than 1 minute  Total fixation time: Approximately 8.4 hours  Core pieces: 3 plus additional fragments  Size: Ranges from 0.9-1.3 cm in length and 0.1 cm in diameter  Description: Tan cores of soft tissue  Ink color: Blue  Entirely submitted in 1 cassette.   CM 08/21/2021   Final Diagnosis performed by Quay Burow, MD.   Electronically signed  08/22/2021 10:14:58AM  The electronic signature  indicates that the named Attending Pathologist  has evaluated the specimen  Technical component performed at [facility], [address] [place],  [place] [address] Lab: [phone] Dir: Rush Farmer, MD, MMM   Professional component performed at [facility], [facility], [place], [place], [place] [address] Lab: [phone]  Dir: Kathi Simpers, MD   ADDENDUM:  CASE SUMMARY: BREAST BIOMARKER TESTS - Part A.  LEFT 3:00 6 CM FN  (heart):  Estrogen Receptor (ER) Status: POSITIVE          Percentage of cells with nuclear positivity: Greater than 90%          Average intensity of staining: Strong   Progesterone Receptor (PgR) Status: POSITIVE          Percentage of cells with nuclear positivity: Greater than 90%          Average intensity of staining: Moderate   HER2 (by immunohistochemistry): NEGATIVE (1+)   Ki-67: Not performed   CASE SUMMARY: BREAST BIOMARKER TESTS - Part B. RIGHT 7:00 5 CM FN  (coil):  Estrogen Receptor (ER) Status: POSITIVE          Percentage of cells with nuclear positivity: Greater than 90%          Average intensity of staining: Strong   Progesterone Receptor (PgR) Status: POSITIVE          Percentage of cells with nuclear positivity: 51 to 90%          Average intensity of staining: Moderate   HER2 (by immunohistochemistry): NEGATIVE (1+)   Ki-67: Not performed   Cold Ischemia and Fixation Times: Meet requirements specified in latest  version of the ASCO/CAP guidelines  Testing Performed on Block Number(s): A1 and B1    Imaging: CLINICAL DATA:  Palpable lump left breast   EXAM: DIGITAL DIAGNOSTIC BILATERAL MAMMOGRAM WITH TOMOSYNTHESIS AND CAD; ULTRASOUND LEFT BREAST LIMITED; ULTRASOUND RIGHT BREAST LIMITED   TECHNIQUE: Bilateral digital diagnostic mammography and breast tomosynthesis was performed. The images were evaluated with computer-aided detection.; Targeted ultrasound examination of the left breast was performed.;  Targeted ultrasound examination of the right breast was performed   COMPARISON:  Previous exam(s).   ACR Breast Density Category c: The breast tissue is heterogeneously dense, which may obscure small masses.   FINDINGS: Cc and MLO views of bilateral breasts, spot tangential view of left breast are submitted. At the palpable area lateral left breast, there is a spiculated mass with associated calcifications.  In the slight lateral lower right breast, there is a spiculated mass.   Targeted ultrasound is performed, showing angulated hypoechoic mass at palpable area left breast 3 o'clock cm from nipple measuring 1.7 x 1.7 x 1.6 cm correlating to. Ultrasound of the left axilla is negative.   Ultrasound right breast demonstrates a 1 x 0.7 x 0.8 cm angulated spiculated mass at right breast 7 o'clock 5 cm from nipple correlating to the mammographic mass. Ultrasound of the right axilla demonstrates at 3 abnormal thickened cortex lymph node.   IMPRESSION: Highly suspicious findings.   RECOMMENDATION: Recommend ultrasound-guided core biopsies x3 of bilateral breast mass and abnormal right axillary lymph node.   I have discussed the findings and recommendations with the patient. If applicable, a reminder letter will be sent to the patient regarding the next appointment.   BI-RADS CATEGORY  5: Highly suggestive of malignancy.     Electronically Signed   By: Abelardo Diesel M.D.   On: 08/06/2021 12:53   Within last 24 hrs: No results found.  CLINICAL DATA:  Recent diagnosis of bilateral breast cancer. Biopsy of mass in the 3 o'clock location of the LEFT breast showed in faces mammary carcinoma with lobular features and in-situ carcinoma, lobular neoplasia. Biopsy of mass in the 7 o'clock location of the RIGHT breast showed invasive mammary carcinoma with lobular features and in-situ carcinoma. Biopsy of RIGHT axillary lymph node shows metastatic mammary carcinoma.    EXAM: BILATERAL BREAST MRI WITH AND WITHOUT CONTRAST   TECHNIQUE: Multiplanar, multisequence MR images of both breasts were obtained prior to and following the intravenous administration of 7.5 ml of Gadavist   Three-dimensional MR images were rendered by post-processing of the original MR data on an independent workstation. The three-dimensional MR images were interpreted, and findings are reported in the following complete MRI report for this study. Three dimensional images were evaluated at the independent interpreting workstation using the DynaCAD thin client.   COMPARISON:  08/21/2021 and earlier   FINDINGS: Breast composition: c. Heterogeneous fibroglandular tissue.   Background parenchymal enhancement: Minimal   Right breast: Within the LOWER OUTER QUADRANT of the RIGHT breast, at middle depth, there is an enhancing mass measuring 2.5 x 1.4 by 1.3 centimeters and associated with tissue marker clip artifact. Mass demonstrates washout kinetics and is consistent with known malignancy. Otherwise the RIGHT breast is negative.   Left breast: Within the LOWER OUTER QUADRANT of the LEFT breast, there is an enhancing mass measuring at least 3.4 x 2.6 by 2.6 centimeters. Tissue marker clip artifact and 2.8 centimeter post biopsy hematoma are associated with mass. Posterior to the mass, there is additional non mass enhancement showing primarily persistent kinetics and measuring 2.9 x 2.5 x 2.9 centimeters. A 5 millimeter enhancing satellite masses identified along the LATERAL aspect of the lesion (image 50 of series 6). The non mass enhancement in the posterior aspect of the LOWER OUTER QUADRANT of the LEFT breast is contiguous with the pectoralis muscle. The pectoralis muscle is tented with the patient prone position. However, there is no enhancement of the pectoralis. Measured together, the mass and non mass enhancement are 5.1 x 3.2 x 2.9 centimeters. LEFT breast is  otherwise negative.   Lymph nodes: Numerous enlarged RIGHT axillary lymph nodes are present. There are at least 4 morphologically abnormal lymph nodes, 1 of which is associated with biopsy changes after recent ultrasound-guided core biopsy showing metastatic disease. LEFT axilla is unremarkable.   Ancillary findings:  None.   IMPRESSION:  1. 2.5 centimeter enhancing mass in the LOWER OUTER QUADRANT of the RIGHT breast, correlating with known malignancy. 2. At least 4 enlarged RIGHT axillary lymph nodes, correlating well with recently biopsied lymph node showing metastatic disease. 3. 3.4 centimeter enhancing mass in the LEFT breast, with an additional 2.9 centimeters of non mass enhancement posterior to the mass also suspicious for malignancy. This likely correlates with the area calcifications seen mammographically. 4. 5 millimeters satellite nodule along the LATERAL aspect of known malignancy in the LOWER OUTER QUADRANT of the LEFT breast. 5. LEFT axilla is negative for adenopathy.   RECOMMENDATION: 1. If LEFT breast conservation is being considered, recommend stereotactic guided core biopsy of asymmetry associated with calcifications in the LOWER OUTER QUADRANT of the LEFT breast to document extent of disease. 2. Treatment plan for known bilateral breast malignancy.   BI-RADS CATEGORY  4: Suspicious.     Electronically Signed   By: Nolon Nations M.D.   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 I am concerned about the overall follow-through of this patient and her family with the burden of presenting for regular radiation treatments for breast conservation.  We have considered the high risks of positive margins with attempted lumpectomy. If there is no role for neoadjuvant, I suspect we will have at least 3 positive lymph nodes based on imaging, and may be wise to just proceed with a right axillary dissection, question  the value of a left sentinel lymph node biopsy.  Anticipate discussion breast cancer conference on the 12th. Patient Active Problem List   Diagnosis Date Noted  . Thrombocytopenia ([place]) 09/08/2021  . Malignant neoplasm of upper-outer quadrant of left breast in female, estrogen receptor positive ([place]) 08/28/2021  . Malignant neoplasm of lower-outer quadrant of right breast of female, estrogen receptor positive ([place]) 08/28/2021  . Goals of care, counseling/discussion 08/28/2021  . Severe recurrent major depression with psychotic features ([place]) 11/15/2015  . Hypertension 11/15/2015  . Noncompliance 11/15/2015  . Severe major depression, single episode, with psychotic features, mood-congruent ([place]) 03/29/2015  . Protein-calorie malnutrition, severe 03/26/2015  . 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  I will follow-up her MRI results from later today. I believe it would be prudent to have them meet with Dr. Baruch Gouty before surgery and his assessment if breast conservation remains viable considering their ability to follow through with radiation therapy.  Face-to-face time spent with the patient and accompanying care providers(if present) was 45 minutes, with more than 50% of the time spent counseling, educating, and coordinating care of the patient.    These notes generated with voice recognition software. I apologize for typographical errors.  [name] M.D., FACS 09/10/2021, 1:44 PM

## 2021-09-10 NOTE — Telephone Encounter (Signed)
Test results scanned in media  

## 2021-09-10 NOTE — Patient Instructions (Addendum)
Our surgery scheduler will call you within 24-48 hours to schedule your surgery. Please have the [facility] surgery sheet available when speaking with her.      Total or Modified Radical Mastectomy A total mastectomy and a modified radical mastectomy are surgeries that are done as part of treatment for breast cancer. Both types involve removing a breast. In a total mastectomy (simple mastectomy), all breast tissue including the nipple will be removed. In a modified radical mastectomy, lymph nodes under the arm will be removed along with the breast and nipple. Some of the lining over the muscle tissues under the breast may also be removed. These procedures may also be used to help prevent breast cancer. A preventive (prophylactic) mastectomy may be done if you are at an increased risk of breast cancer due to harmful changes (mutations) in certain genes, such as the BRCA genes. In that case, the procedure involves removing both of your breasts. This can reduce your risk of developing breast cancer in the future. For a transgender person, a total mastectomy may be done as part of a surgical transition from female to female. Let your health care provider know about: Any allergies you have. All medicines you are taking, including vitamins, herbs, eye drops, creams, and over-the-counter medicines. Any problems you or family members have had with anesthetic medicines. Any bleeding problems you have. Any surgeries you have had. Any medical conditions you have. Whether you are pregnant or may be pregnant. What are the risks? Generally, this is a safe procedure. However, problems may occur, including: Infection. Bleeding. Allergic reactions to medicines. Scar tissue. Chest numbness, sensation of throbbing, or tingling on the side of the surgery. Fluid buildup under the skin flaps where your breast was removed (seroma). Stress or sadness from losing your breast. If you have the lymph nodes under your arm  removed, you may have arm swelling, weakness, or numbness on the same side of your body as your surgery. What happens before the procedure? When to stop eating and drinking Follow instructions from your health care provider about what you may eat and drink before your procedure. These may include: 8 hours before your procedure Stop eating most foods. Do not eat meat, fried foods, or fatty foods. Eat only light foods, such as toast or crackers. All liquids are okay except energy drinks and alcohol. 6 hours before your procedure Stop eating. Drink only clear liquids, such as water, clear fruit juice, black coffee, plain tea, and sports drinks. Do not drink energy drinks or alcohol. 2 hours before your procedure Stop drinking all liquids. You may be allowed to take medicines with small sips of water. If you do not follow your health care provider's instructions, your procedure may be delayed or canceled. Medicines Ask your health care provider about: Changing or stopping your regular medicines. This is especially important if you are taking diabetes medicines or blood thinners. Taking medicines such as aspirin and ibuprofen. These medicines can thin your blood. Do not take these medicines unless your health care provider tells you to take them. Taking over-the-counter medicines, vitamins, herbs, and supplements. General instructions You may be checked for extra fluid around your lymph nodes (lymphedema). Do not use any products that contain nicotine or tobacco before the procedure. These products include cigarettes, chewing tobacco, and vaping devices, such as e-cigarettes. If you need help quitting, ask your health care provider. Ask your health care provider about: How your surgery site will be marked. What steps will be taken  to help prevent infection. These steps may include: Removing hair at the surgery site. Washing skin with a germ-killing soap. Taking antibiotic medicine. What  happens during the procedure? An IV will be inserted into one of your veins. You will be given: A medicine to help you relax (sedative). A medicine to make you fall asleep (general anesthetic). A wide incision will be made around your nipple. The skin of the breast and the nipple inside the incision will be removed along with all breast tissue. Lymph nodes under the arm on the side of the tumor will be checked to see if the cancer has spread. If you are having a modified radical mastectomy: The lining over your chest muscles will be removed. The incision may be extended to reach the lymph nodes under your arm, or a second incision may be made. Lymph nodes will be removed. Breast tissue and lymph nodes that are removed will be sent to the lab for testing. You may have a drainage tube inserted into your incision to collect fluid that builds up after surgery. This tube will be connected to a suction bulb on the outside of your body to remove the fluid. Your incision or incisions will be closed with stitches (sutures), skin glue, or adhesive strips. A bandage (dressing) will be placed over your breast area. If lymph nodes were removed, a dressing will also be placed under your arm. The procedure may vary among health care providers and hospitals. What happens after the procedure? Your blood pressure, heart rate, breathing rate, and blood oxygen level will be monitored until you leave the hospital or clinic. You will be given pain medicine as needed. Your IV can be removed when you are able to eat and drink. You may have a drainage tube in place for 2-3 days to prevent a collection of blood (hematoma) from developing in the breast area. You will be given instructions about caring for the drain before you go home. A pressure bandage may be applied for 1-2 days to prevent bleeding or swelling. Ask your health care provider how to care for your pressure bandage at home. Summary In a total mastectomy  (simple mastectomy), all breast tissue including the nipple will be removed. In a modified radical mastectomy, lymph nodes under the arm will be removed along with the breast and nipple, and the chest wall lining. Before the procedure, follow instructions from your health care provider about eating and drinking, and ask about changing or stopping your regular medicines. You may have a drainage tube inserted into your incision to collect fluid that builds up after surgery. This tube will be connected to a suction bulb on the outside of your body to remove the fluid. This information is not intended to replace advice given to you by your health care provider. Make sure you discuss any questions you have with your health care provider. Document Revised: 12/16/2020 Document Reviewed: 12/16/2020 Elsevier Patient Education  [place].

## 2021-09-11 ENCOUNTER — Other Ambulatory Visit: Payer: Self-pay | Admitting: Surgery

## 2021-09-11 ENCOUNTER — Telehealth: Payer: Self-pay | Admitting: Surgery

## 2021-09-11 DIAGNOSIS — Z170 Estrogen receptor positive status [ER+]: Secondary | ICD-10-CM

## 2021-09-11 NOTE — Telephone Encounter (Signed)
Spoke with husband, [name] and patient.  They are informed of the following for surgery:   Pre-Admission date/time, COVID Testing date and Surgery date.  Surgery Date: 09/23/21, they are to arrive at 7:30 am at [facility] as patient will be having SLN bx injection done first prior to surgery with Dr. Christian Mate.   Preadmission Testing Date: 09/16/21 (phone 8a-1p) Covid Testing Date: Not needed.

## 2021-09-12 ENCOUNTER — Ambulatory Visit: Payer: Self-pay | Admitting: Gastroenterology

## 2021-09-16 ENCOUNTER — Encounter (HOSPITAL_COMMUNITY): Payer: Self-pay | Admitting: Urgent Care

## 2021-09-16 ENCOUNTER — Encounter
Admission: RE | Admit: 2021-09-16 | Discharge: 2021-09-16 | Disposition: A | Discharge status: Home / Self Care | Payer: Self-pay | Source: Ambulatory Visit | Attending: Surgery | Admitting: Surgery

## 2021-09-16 ENCOUNTER — Encounter: Payer: Self-pay | Admitting: *Deleted

## 2021-09-16 ENCOUNTER — Encounter: Payer: Self-pay | Admitting: Surgery

## 2021-09-16 ENCOUNTER — Ambulatory Visit: Payer: Self-pay | Admitting: Surgery

## 2021-09-16 DIAGNOSIS — Z170 Estrogen receptor positive status [ER+]: Secondary | ICD-10-CM

## 2021-09-16 DIAGNOSIS — C50511 Malignant neoplasm of lower-outer quadrant of right female breast: Secondary | ICD-10-CM

## 2021-09-16 HISTORY — DX: Anxiety disorder, unspecified: F41.9

## 2021-09-16 NOTE — Progress Notes (Signed)
Received a secure chat from pre admit testing that blood work is needed prior to her bil mastectomy and patient stated she doesn't have a ride.   Arranged for Sharon Woods to pick her up and bring her for lab appt at pre admit testing.   Pt given appt. Info and details about Sharon Woods.

## 2021-09-16 NOTE — Patient Instructions (Addendum)
Your procedure is scheduled on: Monday September 23, 2021. Arrive at 7:30 am at [facility] as patient will be having SLN bx injection done first prior to surgery  inside [place] 2nd floor.   Remember: Instructions that are not followed completely may result in serious medical risk,  up to and including death, or upon the discretion of your surgeon and anesthesiologist your  surgery may need to be rescheduled.     _X__ 1. Do not eat food or after midnight the night before your procedure.                 No chewing gum or hard candies.   __X__2.  On the morning of surgery brush your teeth with toothpaste and water, you                may rinse your mouth with mouthwash if you wish.  Do not swallow any toothpaste or mouthwash.     _X__ 3.  No Alcohol for 24 hours before or after surgery.   _X__ 4.  Do Not Smoke or use e-cigarettes For 24 Hours Prior to Your Surgery.                 Do not use any chewable tobacco products for at least 6 hours prior to                 Surgery.  _X__  5.  Do not use any recreational drugs (marijuana, cocaine, heroin, ecstasy, MDMA or other)                For at least one week prior to your surgery.  Combination of these drugs with anesthesia                May have life threatening results.  ____  6.  Bring all medications with you on the day of surgery if instructed.   __X__  7.  Notify your doctor if there is any change in your medical condition      (cold, fever, infections).     Do not wear jewelry, make-up, hairpins, clips or nail polish. Do not wear lotions, powders, or perfumes. deodorant. Do not shave 48 hours prior to surgery. Men may shave face and neck. Do not bring valuables to the hospital.    [facility] is not responsible for any belongings or valuables.  Contacts, dentures or bridgework may not be worn into surgery. Leave your suitcase in the car. After surgery it may be brought to your room. For patients admitted to  the hospital, discharge time is determined by your treatment team.   Patients discharged the day of surgery will not be allowed to drive home.   Make arrangements for someone to be with you for the first 24 hours of your Same Day Discharge.   __X__ Take these medicines the morning of surgery with A SIP OF WATER:    1. None   2.   3.   4.  5.  6.  ____ Fleet Enema (as directed)   __X__ Use CHG Soap (or wipes) as directed  ____ Use Benzoyl Peroxide Gel as instructed  ____ Use inhalers on the day of surgery  ____ Stop metformin 2 days prior to surgery    ____ Take 1/2 of usual insulin dose the night before surgery. No insulin the morning          of surgery.   ____ Call your PCP, cardiologist, or Pulmonologist if  taking Coumadin/Plavix/aspirin and ask when to stop before your surgery.   __X__ One Week prior to surgery- Stop Anti-inflammatories such as Ibuprofen, Aleve, Advil, Motrin, meloxicam (MOBIC), diclofenac, etodolac, ketorolac, Toradol, Daypro, piroxicam, Goody's or BC powders. OK TO USE TYLENOL IF NEEDED   __X__ Stop supplements until after surgery.    ____ Bring C-Pap to the hospital.    If you have any questions regarding your pre-procedure instructions,  Please call Pre-admit Testing at [phone]

## 2021-09-17 ENCOUNTER — Telehealth: Payer: Self-pay

## 2021-09-17 NOTE — Telephone Encounter (Signed)
Attempted to contact patient regarding completion of [facility] medicaid application. Left message on voicemail [phone], when called [phone] received recording x 3, "not able to complete call". I will attempt to contact patient again tomorrow.

## 2021-09-18 ENCOUNTER — Encounter
Admission: RE | Admit: 2021-09-18 | Discharge: 2021-09-18 | Disposition: A | Discharge status: Home / Self Care | Payer: Medicaid Other | Source: Ambulatory Visit | Attending: Surgery | Admitting: Surgery

## 2021-09-18 ENCOUNTER — Inpatient Hospital Stay: Payer: Medicaid Other

## 2021-09-18 DIAGNOSIS — Z170 Estrogen receptor positive status [ER+]: Secondary | ICD-10-CM | POA: Diagnosis not present

## 2021-09-18 DIAGNOSIS — R7989 Other specified abnormal findings of blood chemistry: Secondary | ICD-10-CM | POA: Diagnosis not present

## 2021-09-18 DIAGNOSIS — C50511 Malignant neoplasm of lower-outer quadrant of right female breast: Secondary | ICD-10-CM | POA: Diagnosis not present

## 2021-09-18 DIAGNOSIS — E119 Type 2 diabetes mellitus without complications: Secondary | ICD-10-CM | POA: Diagnosis not present

## 2021-09-18 DIAGNOSIS — C50412 Malignant neoplasm of upper-outer quadrant of left female breast: Secondary | ICD-10-CM | POA: Insufficient documentation

## 2021-09-18 DIAGNOSIS — Z01812 Encounter for preprocedural laboratory examination: Secondary | ICD-10-CM | POA: Insufficient documentation

## 2021-09-18 LAB — HEMOGLOBIN A1C
Hgb A1c MFr Bld: 10.2 % — ABNORMAL HIGH (ref 4.8–5.6)
Mean Plasma Glucose: 246.04 mg/dL

## 2021-09-18 NOTE — Progress Notes (Signed)
  Perioperative Services: Pre-Admission/Anesthesia Testing  Abnormal Lab Notification    Date: 09/18/21  Name: Sharon Woods MRN:   [id number]  Re: Abnormal labs noted during PAT appointment   Provider(s) Notified: [name], MD Notification mode: Routed and/or faxed via [facility]   ABNORMAL LAB VALUE(S): Lab Results  Component Value Date   GLUCOSE 271 (H) 08/28/2021    Notes: Patient with a T2DM diagnosis. She is not currently taking any type of medications to treat her diabetes. A1c today was 10.2%; was previously 10.6% 9 months ago. In efforts to reduce his risk of developing SSI, or other potential perioperative complications, this communication is being sent in order to determine if patient is deemed to have adequate medical optimization, including preoperative glycemic control.   Recent breast and lymph node biopsies showed ER/PR (+), HER2/neu (-) metastatic mammary carcinoma. Neoadjuvant surgical resection has been agreed upon by medical oncology and general surgery. Bone scan (-) for distant metastasis. Treatment will be based on final pathology following resection, however I would assume from the notes that her ECOG would not support her receiving full dose antineoplastic chemotherapy. With that being said, I do anticipate that Dr. Tasia Catchings (oncology) will proceed with endocrine therapy using an AI.   We are in a hard spot with this patient, as she a diagnosis of undetermined prognosis. Definitely not something that should be delayed. Wanted to make you aware so that all interdisciplinary members could monitor her closely postoperatively for developing infection.   Honor Loh, MSN, APRN, FNP-C, CEN [facility]  Peri-operative Services Nurse Practitioner Phone: [phone] 09/18/21 2:44 PM

## 2021-09-19 ENCOUNTER — Telehealth: Payer: Self-pay | Admitting: Surgery

## 2021-09-19 NOTE — Telephone Encounter (Signed)
Surgery for 09/23/21 is being cancelled due to patient's A1c too high.  Dr. Christian Mate did speak with the patient and her husband to inform them of this.  It is explained to them the importance of finding a primary care doctor to get this under control.  Our office as well trying to get them in with primary care doctor.  Will have to cancel surgery for now.

## 2021-09-20 ENCOUNTER — Telehealth: Payer: Self-pay

## 2021-09-20 ENCOUNTER — Encounter: Payer: Self-pay | Admitting: *Deleted

## 2021-09-23 ENCOUNTER — Encounter: Admission: RE | Payer: Self-pay | Source: Home / Self Care

## 2021-09-23 ENCOUNTER — Ambulatory Visit: Admission: RE | Admit: 2021-09-23 | Payer: Self-pay | Source: Home / Self Care | Admitting: Surgery

## 2021-09-23 ENCOUNTER — Ambulatory Visit: Payer: Self-pay

## 2021-09-23 SURGERY — MASTECTOMY, MODIFIED RADICAL
Anesthesia: General | Laterality: Right

## 2021-09-24 ENCOUNTER — Inpatient Hospital Stay: Payer: Medicaid Other

## 2021-09-24 ENCOUNTER — Inpatient Hospital Stay: Payer: Medicaid Other | Attending: Oncology | Admitting: Licensed Clinical Social Worker

## 2021-09-24 ENCOUNTER — Encounter: Payer: Self-pay | Admitting: *Deleted

## 2021-09-24 ENCOUNTER — Encounter: Payer: Self-pay | Admitting: Licensed Clinical Social Worker

## 2021-09-24 DIAGNOSIS — C50511 Malignant neoplasm of lower-outer quadrant of right female breast: Secondary | ICD-10-CM | POA: Insufficient documentation

## 2021-09-24 DIAGNOSIS — D696 Thrombocytopenia, unspecified: Secondary | ICD-10-CM | POA: Insufficient documentation

## 2021-09-24 DIAGNOSIS — F322 Major depressive disorder, single episode, severe without psychotic features: Secondary | ICD-10-CM | POA: Insufficient documentation

## 2021-09-24 DIAGNOSIS — Z803 Family history of malignant neoplasm of breast: Secondary | ICD-10-CM | POA: Diagnosis not present

## 2021-09-24 DIAGNOSIS — E119 Type 2 diabetes mellitus without complications: Secondary | ICD-10-CM | POA: Insufficient documentation

## 2021-09-24 DIAGNOSIS — C50512 Malignant neoplasm of lower-outer quadrant of left female breast: Secondary | ICD-10-CM | POA: Diagnosis not present

## 2021-09-24 DIAGNOSIS — C50412 Malignant neoplasm of upper-outer quadrant of left female breast: Secondary | ICD-10-CM | POA: Insufficient documentation

## 2021-09-24 DIAGNOSIS — Z79811 Long term (current) use of aromatase inhibitors: Secondary | ICD-10-CM | POA: Insufficient documentation

## 2021-09-24 DIAGNOSIS — Z9013 Acquired absence of bilateral breasts and nipples: Secondary | ICD-10-CM | POA: Insufficient documentation

## 2021-09-24 DIAGNOSIS — Z87891 Personal history of nicotine dependence: Secondary | ICD-10-CM | POA: Insufficient documentation

## 2021-09-24 DIAGNOSIS — N760 Acute vaginitis: Secondary | ICD-10-CM | POA: Insufficient documentation

## 2021-09-24 DIAGNOSIS — Z170 Estrogen receptor positive status [ER+]: Secondary | ICD-10-CM | POA: Insufficient documentation

## 2021-09-24 NOTE — Progress Notes (Signed)
Met with patient and husband in the lobby to have her sign her medicaid application.   She stated Dr. Lajuan Lines office is trying to find her a PCP so they can lower her blood sugar prior to surgery.

## 2021-09-24 NOTE — Progress Notes (Signed)
REFERRING PROVIDER: Rickard Patience, MD [address] [place],  [place] [address]  PRIMARY PROVIDER:  Patient, No Pcp Per  PRIMARY REASON FOR VISIT:  1. Malignant neoplasm of lower-outer quadrant of right breast of female, estrogen receptor positive (HCC)   2. Malignant neoplasm of upper-outer quadrant of left breast in female, estrogen receptor positive (HCC)   3. Family history of breast cancer      HISTORY OF PRESENT ILLNESS:   Sharon Woods, a 61 y.o. female, was seen for a [place] cancer genetics consultation at the request of Dr. Cathie Hoops due to a personal and family history of breast cancer.  Sharon Woods presents to clinic today to discuss the possibility of a hereditary predisposition to cancer, genetic testing, and to further clarify her future cancer risks, as well as potential cancer risks for family members.   In 2023, at the age of 47, Sharon Woods was diagnosed with invasive mammary carcinoma of the right breast, ER/PR+, HER2-, and invasive mammary carcinoma of the left breast, ER/PR+, HER2-. She plans to have a bilateral mastectomy.  CANCER HISTORY:  Oncology History  Malignant neoplasm of upper-outer quadrant of left breast in female, estrogen receptor positive (HCC)  08/06/2021 Mammogram   08/06/2021, digital bilateral mammogram and ultrasound showed left breast 3:00 mass, 1.7 x 1.7 x 1.7 cm, ultrasound of the left axillary is negative. 08/21/2021 right breast 1 x 0.7 x 0.8 cm angulated spiculated mass at the right breast 7:00, 5 cm from nipple.  Ultrasound of the right axillary demonstrates 3 abnormal thickened cortex lymph node. 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 Bary Richard right breast 7:00, invasive mammary carcinoma with lobular features, in situ carcinoma,  ER+, PR+, HER 2-This was found to be concordant by  Dr. Bary Richard. right axilla lymph node +, extracapsular extension.  -This was found to be concordant by Dr. Bary Richard   08/28/2021 Initial Diagnosis   Malignant neoplasm of upper-outer quadrant of left breast in female, estrogen receptor positive (HCC)   08/28/2021 Cancer Staging   Staging form: Breast, AJCC 8th Edition - Clinical stage from 08/28/2021: Stage IA (cT1c, cN0, cM0, G2, ER+, PR+, HER2-) - Signed by Rickard Patience, MD on 08/28/2021 Stage prefix: Initial diagnosis Histologic grading system: 3 grade system   08/28/2021 Imaging   CT CHEST, ABDOMEN, AND PELVIS WITH CONTRAST Asymmetric mildly enlarged right axillary nodes. Correlate with biopsy. There are some punctate foci of sclerosis in the included spine that could reflect subtle metastatic disease. Nonobstructing bilateral renal calculi.   08/29/2021 Imaging   BILATERAL BREAST MRI WITH AND WITHOUT CONTRAST 1. 2.5 centimeter enhancing mass in the LOWER OUTER QUADRANT of the RIGHT breast, correlating with known malignancy. 2. At least 4 enlarged RIGHT axillary lymph nodes, correlating well with recently biopsied lymph node showing metastatic disease. 3. 3.4 centimeter enhancing mass in the LEFT breast, with an additional 2.9 centimeters of non mass enhancement posterior to the mass also suspicious for malignancy. This likely correlates with the area calcifications seen mammographically. 4. 5 millimeters satellite nodule along the LATERAL aspect of known malignancy in the LOWER OUTER QUADRANT of the LEFT breast. 5. LEFT axilla is negative for adenopathy.       09/05/2021 Imaging   NUCLEAR MEDICINE WHOLE BODY BONE SCAN Uptake at L2 which is nonspecific but may be related to  advanced degenerative disc and facet disease changes at both L1-L2 and L2-L3; no evidence of osseous metastatic disease by CT. No definite osseous metastatic lesions identified.   09/23/2021 Surgery   Surgery   Malignant neoplasm of lower-outer quadrant of right  breast of female, estrogen receptor positive (HCC)  08/06/2021 Mammogram   08/06/2021, digital bilateral mammogram and ultrasound showed left breast 3:00 mass, 1.7 x 1.7 x 1.7 cm, ultrasound of the left axillary is negative. 08/21/2021 right breast 1 x 0.7 x 0.8 cm angulated spiculated mass at the right breast 7:00, 5 cm from nipple.  Ultrasound of the right axillary demonstrates 3 abnormal thickened cortex lymph node. 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 Bary Richard right breast 7:00, invasive mammary carcinoma with lobular features, in situ carcinoma,  ER+, PR+, HER 2-This was found to be concordant by Dr. Bary Richard. right axilla lymph node +, extracapsular extension.  -This was found to be concordant by Dr. Bary Richard   08/28/2021 Initial Diagnosis   Malignant neoplasm of lower-outer quadrant of right breast of female, estrogen receptor positive (HCC)   08/28/2021 Cancer Staging   Staging form: Breast, AJCC 8th Edition - Clinical stage from 08/28/2021: Stage IIA (cT2, cN1, cM0, G2, ER+, PR+, HER2-) - Signed by Rickard Patience, MD on 09/09/2021 Stage prefix: Initial diagnosis Histologic grading system: 3 grade system   08/28/2021 Imaging   CT CHEST, ABDOMEN, AND PELVIS WITH CONTRAST Asymmetric mildly enlarged right axillary nodes. Correlate with biopsy. There are some punctate foci of sclerosis in the included spine that could reflect subtle metastatic disease. Nonobstructing bilateral renal calculi.   08/29/2021 Imaging   BILATERAL BREAST MRI WITH AND WITHOUT CONTRAST 1. 2.5 centimeter enhancing mass in the LOWER OUTER QUADRANT of the RIGHT breast, correlating with known malignancy. 2. At least 4 enlarged RIGHT axillary lymph nodes, correlating well with recently biopsied lymph node showing metastatic disease. 3. 3.4  centimeter enhancing mass in the LEFT breast, with an additional 2.9 centimeters of non mass enhancement posterior to the mass also suspicious for malignancy. This likely correlates with the area calcifications seen mammographically. 4. 5 millimeters satellite nodule along the LATERAL aspect of known malignancy in the LOWER OUTER QUADRANT of the LEFT breast. 5. LEFT axilla is negative for adenopathy.       09/05/2021 Imaging   NUCLEAR MEDICINE WHOLE BODY BONE SCAN Uptake at L2 which is nonspecific but may be related to advanced degenerative disc and facet disease changes at both L1-L2 and L2-L3; no evidence of osseous metastatic disease by CT. No definite osseous metastatic lesions identified.   09/09/2021 Oncotype testing   [id number] block RIGHT 7:00 5 CM FN; ULTRASOUND-GUIDED BIOPSY Oncotype Dx RS score 22   09/23/2021 Surgery   Surgery      RISK FACTORS:  Menarche was at age 74-14.  First live birth at age 40.  OCP use for approximately  5-10  years.  Ovaries intact: no.  Hysterectomy: yes.  Menopausal status: postmenopausal.  HRT use: 3 years. Colonoscopy: no; not examined.  Past Medical History:  Diagnosis Date   Anxiety    History of high cholesterol 2000   Hypertension    Patient denies medical problems     Past Surgical History:  Procedure Laterality Date   ABDOMINAL HYSTERECTOMY     BREAST BIOPSY Right 08/21/2021  [place] bx 7:00 mass coil clip path pending   BREAST BIOPSY Right 08/21/2021   [place] bx of LN, hydro marker, path pending   BREAST BIOPSY Left 08/21/2021   [place] bx, heart marker, path pending   CESAREAN SECTION     x2   NO PAST SURGERIES      FAMILY HISTORY:  We obtained a detailed, 4-generation family history.  Significant diagnoses are listed below: Family History  Problem Relation Age of Onset   Lung cancer Mother    Dementia Mother    Parkinson's disease Father    Diabetes Sister    Heart attack Sister    Breast cancer Sister     Dementia Maternal Grandmother    Cancer Other    Dementia Other    Sharon Woods has 2 sons, 59 and 12. She has 2 brothers and 1 sister. Her sister had breast cancer in her 33s and passed at 49. She has a nephew that has tumors, unsure type.  Sharon Woods mother died in her 92s-80s and had lung cancer. Patient had 6 maternal uncles, 1 aunt. Two uncles had cancers, unknown types. Maternal grandmother had cancer, unknown type.   Sharon Woods father died in his 73s and also had cancer, unknown type. No other known cancers on this side of the family.  Sharon Woods is unaware of previous family history of genetic testing for hereditary cancer risks.There is no reported Ashkenazi Jewish ancestry. There is no known consanguinity.    GENETIC COUNSELING ASSESSMENT: Ms. Pichette is a 61 y.o. female with a personal and family history of breast cancer which is somewhat suggestive of a hereditary cancer syndrome and predisposition to cancer. We, therefore, discussed and recommended the following at today's visit.   DISCUSSION: We discussed that approximately 10% of breast cancer is hereditary. Most cases of hereditary breast cancer are associated with BRCA1/BRCA2 genes, although there are other genes associated with hereditary cancer as well. Cancers and risks are gene specific. We discussed that testing is beneficial for several reasons including knowing about cancer risks, identifying potential screening and risk-reduction options that may be appropriate, and to understand if other family members could be at risk for cancer and allow them to undergo genetic testing.   We reviewed the characteristics, features and inheritance patterns of hereditary cancer syndromes. We also discussed genetic testing, including the appropriate family members to test, the process of testing, insurance coverage and turn-around-time for results. We discussed the implications of a negative, positive and/or variant of uncertain significant  result. We recommended Sharon Woods pursue genetic testing for the [facility] CancerNext-Expanded+RNA gene panel.   Based on Sharon Woods's personal and family history of cancer, she meets medical criteria for genetic testing. Despite that she meets criteria, she may still have an out of pocket cost. We discussed that if her out of pocket cost for testing is over $100, the laboratory will call and confirm whether she wants to proceed with testing.  If the out of pocket cost of testing is less than $100 she will be billed by the genetic testing laboratory.   PLAN: After considering the risks, benefits, and limitations, Sharon Woods provided informed consent to pursue genetic testing and the blood sample was sent to [facility] for analysis of the CancerNext-Expanded+RNA panel. Results should be available within approximately 2-3 weeks' time, at which point they will be disclosed by telephone to Sharon Woods, as will any additional recommendations warranted by these results. Sharon Woods will receive a summary of  her genetic counseling visit and a copy of her results once available. This information will also be available in Epic.   Sharon Woods questions were answered to her satisfaction today. Our contact information was provided should additional questions or concerns arise. Thank you for the referral and allowing [place] to share in the care of your patient.   Lacy Duverney, MS, [facility] Genetic Counselor [email or web address] Phone: [phone]  The patient was seen for a total of 30 minutes in face-to-face genetic counseling. Patient's husband was also present. Dr. Orlie Dakin was available for discussion regarding this case.   _______________________________________________________________________ For Office Staff:  Number of people involved in session: 2 Was an Intern/ student involved with case: no

## 2021-09-27 ENCOUNTER — Inpatient Hospital Stay (HOSPITAL_BASED_OUTPATIENT_CLINIC_OR_DEPARTMENT_OTHER): Payer: Medicaid Other | Admitting: Oncology

## 2021-09-27 ENCOUNTER — Encounter: Payer: Self-pay | Admitting: Oncology

## 2021-09-27 VITALS — BP 162/98 | HR 75 | Temp 97.4°F | Resp 18 | Wt 181.7 lb

## 2021-09-27 DIAGNOSIS — N760 Acute vaginitis: Secondary | ICD-10-CM | POA: Diagnosis not present

## 2021-09-27 DIAGNOSIS — E119 Type 2 diabetes mellitus without complications: Secondary | ICD-10-CM | POA: Diagnosis not present

## 2021-09-27 DIAGNOSIS — Z87891 Personal history of nicotine dependence: Secondary | ICD-10-CM | POA: Diagnosis not present

## 2021-09-27 DIAGNOSIS — D696 Thrombocytopenia, unspecified: Secondary | ICD-10-CM | POA: Diagnosis not present

## 2021-09-27 DIAGNOSIS — C50412 Malignant neoplasm of upper-outer quadrant of left female breast: Secondary | ICD-10-CM | POA: Diagnosis not present

## 2021-09-27 DIAGNOSIS — Z170 Estrogen receptor positive status [ER+]: Secondary | ICD-10-CM | POA: Diagnosis not present

## 2021-09-27 DIAGNOSIS — F323 Major depressive disorder, single episode, severe with psychotic features: Secondary | ICD-10-CM

## 2021-09-27 DIAGNOSIS — F322 Major depressive disorder, single episode, severe without psychotic features: Secondary | ICD-10-CM | POA: Diagnosis not present

## 2021-09-27 DIAGNOSIS — Z79811 Long term (current) use of aromatase inhibitors: Secondary | ICD-10-CM | POA: Diagnosis not present

## 2021-09-27 DIAGNOSIS — Z9013 Acquired absence of bilateral breasts and nipples: Secondary | ICD-10-CM | POA: Diagnosis not present

## 2021-09-27 DIAGNOSIS — C50511 Malignant neoplasm of lower-outer quadrant of right female breast: Secondary | ICD-10-CM

## 2021-09-27 MED ORDER — FLUCONAZOLE 100 MG PO TABS: 100.0000 mg | ORAL_TABLET | Freq: Every day | ORAL | 0 refills | Status: DC

## 2021-09-27 MED ORDER — METFORMIN HCL 500 MG PO TABS: 500.0000 mg | ORAL_TABLET | Freq: Every day | ORAL | 0 refills | Status: DC

## 2021-09-27 MED ORDER — BLOOD GLUCOSE MONITOR KIT: PACK | 1 refills | Status: AC

## 2021-09-27 MED ORDER — LETROZOLE 2.5 MG PO TABS: 2.5000 mg | ORAL_TABLET | Freq: Every day | ORAL | 0 refills | Status: DC

## 2021-09-27 NOTE — Progress Notes (Unsigned)
Pt here for follow up. Pt reports she has been having some soreness around her eyes.

## 2021-09-28 DIAGNOSIS — E119 Type 2 diabetes mellitus without complications: Secondary | ICD-10-CM | POA: Insufficient documentation

## 2021-09-28 DIAGNOSIS — N760 Acute vaginitis: Secondary | ICD-10-CM | POA: Insufficient documentation

## 2021-09-28 NOTE — Assessment & Plan Note (Signed)
Etiology unknown.  Could be related to mild splenomegaly.  Check B12, folate,

## 2021-09-28 NOTE — Assessment & Plan Note (Addendum)
#  Left upper outer quadrant breast Invasive carcinoma with lobular features, cT1c N0, ER+, PR+, HER2- # Right lower outer quadrant breast invasive carcinoma with lobular features cancer, cT1b N1, ER+, PR+, HER2- 3 lymph nodes right axillary, 1 was biopsied. Neoadjuvant chemotherapy is not beneficial in carcinoma with lobular features as is unlikely to transform her positive axillary to negative.  Patient has poor insights of her condition and is less likely to be compliant with radiation treatments.  Case was discussed on tumor board.  Consensus patient about bilateral mastectomy. 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  Recommend patient to start on aromatase inhibitor as neoadjuvant endocrine therapy Rationale of using aromatase inhibitor -Arimidex  discussed with patient.  Side effects of letrozole including but not limited to hot flush, joint pain, fatigue, mood swing, osteoporosis discussed with patient. Patient voices understanding and willing to proceed.

## 2021-09-28 NOTE — Assessment & Plan Note (Signed)
See above plan. 

## 2021-09-28 NOTE — Assessment & Plan Note (Signed)
Not on any antidepressants.  Refer to psychiatrist.

## 2021-09-28 NOTE — Assessment & Plan Note (Addendum)
Patient does not have primary care provider. We encouraged her to establish care with a local primary care clinic. Recommend diabetic diet education.  Refer to nutritionist Recommend that she start with metformin 500 mg daily. Plan to titrate up on the dose according to glycemic control. glucometer kit prescription was sent.

## 2021-09-28 NOTE — Progress Notes (Signed)
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 Neoadjuvant chemotherapy is not beneficial in carcinoma with lobular features as is unlikely to transform her positive axillary to negative.  Patient has poor insights of her condition and is less likely to be compliant with radiation treatments.  Case was discussed on tumor board.  Consensus patient about bilateral mastectomy. 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  Recommend patient to start on aromatase inhibitor as neoadjuvant endocrine therapy Rationale of using aromatase inhibitor -Arimidex  discussed with patient.  Side effects of letrozole including but not limited to hot flush, joint pain, fatigue, mood swing, osteoporosis discussed with patient. Patient voices understanding and willing to proceed.    Diabetes [facility]) Patient does not have primary care provider. We encouraged her to establish care with a local primary care clinic. Recommend diabetic diet education.  Refer to nutritionist Recommend that she start with metformin 500 mg daily. Plan to titrate up on the dose according to glycemic control. glucometer kit prescription was sent.  Thrombocytopenia ([place]) Etiology unknown.  Could be related to mild splenomegaly.  Check B12, folate,  Severe major depression, single episode, with psychotic features, mood-congruent ([place]) Not on any antidepressants.  Refer to  psychiatrist.  Malignant neoplasm of lower-outer quadrant of right breast of female, estrogen receptor positive ([place]) See above plan.  Vaginitis Recommend empiric fluconazole 100 mg x 1.  Orders Placed This Encounter  Procedures   CBC with Differential/Platelet    Standing Status:   Future    Standing Expiration Date:   09/28/2022   Comprehensive metabolic panel    Standing Status:   Future    Standing Expiration Date:   09/27/2022   Ambulatory Referral to [facility] Nutrition    Referral Priority:   Routine    Referral Type:   Consultation    Referral Reason:   Specialty Services Required    Number of Visits Requested:   1   Lab MD NP 2 weeks for evaluation of tolerance further management of diabetes.  [name], questions or concerns.  Earlie Server, MD, PhD [facility] Hematology Oncology 09/27/2021   CHIEF COMPLAINTS/REASON FOR VISIT:  bilateral breast cancer  HISTORY OF PRESENTING ILLNESS:   Sharon Woods is a  61 y.o.  female with PMH listed below was seen in consultation at the request of  Earlie Server, MD  for evaluation of bilateral breast cancer  Patient has felt a left breast mass.  08/06/2021, digital bilateral mammogram and ultrasound showed left breast 3:00 mass, 1.7 x 1.7 x 1.7 cm, ultrasound of the left axillary is negative. 08/21/2021 right breast 1 x 0.7 x 0.8 cm angulated spiculated mass at the right breast 7:00, 5 cm from nipple.  Ultrasound of the right axillary demonstrates 3 abnormal thickened cortex lymph node. Patient was recommended to proceed with biopsy left breast 3:00 invasive mammry carcinoma with lobular features. in situ carcinoma, lobular  neoplasia. 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 Franki Cabot right breast 7:00, invasive mammary carcinoma with lobular features, in situ carcinoma,  ER+, PR+, HER 2-This was found to be concordant by Dr. Franki Cabot. right  axilla lymph node +, extracapsular extension.  -This was found to be concordant by Dr. Franki Cabot  there are indeterminate calcifications and asymmetry which extent 2.8 cm posterior-superior to the biopsied mass in the left breast. Which will need additional stereotactic biopsy or wide/bracketed excision to ensure adequate excision. Alternatively, consider breast MRI given lobular histology and to define the extent of disease. Further recommendations will be guided by these decisions.  Patient was recommended to establish care with oncology for further evaluation. Patient was accompanied by her husband.  Patient reports feeling angry and in denial about the diagnosis.  She is a poor historian.  History of major depression, psychosis, previously on olanzapine and Remeron not currently on any and if not currently following up with psychiatrist. Patient's family history is positive for sister with breast cancer + Back pain  Menarche 17 or 61 years of age, Patient has 2 sons. Age at first birth, 53, History of breast pill use: 5 to 10 years Hysterectomy, in her 66s.   Hormone replacement therapy, 3 years 2000 10/30/2009 approximately. Denies any previous breast biopsy.  Denies previous breast radiation.  INTERVAL HISTORY Sharon Woods is a 61 y.o. female who has above history reviewed by me today presents for follow up visit for management of bilateral breast cancer 09/10/2021, patient establish care with Dr. Atha Starks.   she was recommended to have bilateral mastectomy, however her procedures were held due to uncontrolled diabetes with an A1c above 10.  Patient does not have primary care provider and this is a new diagnosis for her.  The patient was accompanied by her son. Patient reports some itchiness of her vagina.  No dysuria.  She feels symptoms similar to previous yeast infection.  Review of Systems  Constitutional:  Negative for appetite change, chills, fatigue and fever.  HENT:    Negative for hearing loss and voice change.   Eyes:  Negative for eye problems.  Respiratory:  Negative for chest tightness and cough.   Cardiovascular:  Negative for chest pain.  Gastrointestinal:  Negative for abdominal distention, abdominal pain and blood in stool.  Endocrine: Negative for hot flashes.  Genitourinary:  Negative for difficulty urinating and frequency.   Musculoskeletal:  Negative for arthralgias.  Skin:  Negative for itching and rash.  Neurological:  Negative for extremity weakness.  Hematological:  Negative for adenopathy.  Psychiatric/Behavioral:  Negative for confusion.     MEDICAL HISTORY:  Past Medical History:  Diagnosis Date   Anxiety    History of high cholesterol 2000   Hypertension    Patient denies medical problems     SURGICAL HISTORY: Past Surgical History:  Procedure Laterality Date   ABDOMINAL HYSTERECTOMY     BREAST BIOPSY Right 08/21/2021   [place] bx 7:00 mass coil clip path pending   BREAST BIOPSY Right 08/21/2021   [place] bx of LN, hydro marker, path pending   BREAST BIOPSY Left 08/21/2021   [place] bx, heart marker, path pending   CESAREAN SECTION     x2   NO PAST SURGERIES      SOCIAL HISTORY: Social History   Socioeconomic History   Marital status: Married    Spouse name: Not on file   Number of children: Not  on file   Years of education: Not on file   Highest education level: Not on file  Occupational History   Not on file  Tobacco Use   Smoking status: Former    Types: Cigarettes    Quit date: 2013    Years since quitting: 10.5   Smokeless tobacco: Never  Vaping Use   Vaping Use: Never used  Substance and Sexual Activity   Alcohol use: Yes    Comment: occasionally   Drug use: No   Sexual activity: Not Currently    Comment: unable to assess   Other Topics Concern   Not on file  Social History Narrative   Not on file   Social Determinants of Health   Financial Resource Strain: Not on file  Food Insecurity: Not on file   Transportation Needs: Unmet Transportation Needs (09/16/2021)   PRAPARE - Hydrologist (Medical): Yes    Lack of Transportation (Non-Medical): Yes  Physical Activity: Not on file  Stress: Not on file  Social Connections: Not on file  Intimate Partner Violence: Not on file    FAMILY HISTORY: Family History  Problem Relation Age of Onset   Lung cancer Mother    Dementia Mother    Parkinson's disease Father    Cancer Father        unk type   Diabetes Sister    Heart attack Sister    Breast cancer Sister    Cancer Maternal Uncle        unk types   Dementia Maternal Grandmother    Cancer Maternal Grandmother        unk type   Cancer Other    Dementia Other     ALLERGIES:  has No Known Allergies.  MEDICATIONS:  Current Outpatient Medications  Medication Sig Dispense Refill   acetaminophen (TYLENOL) 500 MG tablet Take 500 mg by mouth every 6 (six) hours as needed.     blood glucose meter kit and supplies KIT Dispense based on patient and insurance preference. Use up to four times daily as directed. 1 each 1   calcium carbonate (TUMS - DOSED IN MG ELEMENTAL CALCIUM) 500 MG chewable tablet Chew 1 tablet by mouth daily.     fluconazole (DIFLUCAN) 100 MG tablet Take 1 tablet (100 mg total) by mouth daily. 1 tablet 0   letrozole (FEMARA) 2.5 MG tablet Take 1 tablet (2.5 mg total) by mouth daily. 30 tablet 0   metFORMIN (GLUCOPHAGE) 500 MG tablet Take 1 tablet (500 mg total) by mouth daily. 30 tablet 0   No current facility-administered medications for this visit.     PHYSICAL EXAMINATION: ECOG PERFORMANCE STATUS: 0 - Asymptomatic Vitals:   09/27/21 1147  BP: (!) 162/98  Pulse: 75  Resp: 18  Temp: (!) 97.4 F (36.3 C)   Filed Weights   09/27/21 1147  Weight: 181 lb 11.2 oz (82.4 kg)    Physical Exam Constitutional:      General: She is not in acute distress. HENT:     Head: Normocephalic and atraumatic.  Eyes:     General: No scleral  icterus. Cardiovascular:     Rate and Rhythm: Normal rate and regular rhythm.     Heart sounds: Normal heart sounds.  Pulmonary:     Effort: Pulmonary effort is normal. No respiratory distress.     Breath sounds: No wheezing.  Abdominal:     General: Bowel sounds are normal. There is no distension.  Palpations: Abdomen is soft.  Musculoskeletal:        General: No deformity. Normal range of motion.     Cervical back: Normal range of motion and neck supple.     Comments: Tender lower lumbar spine with palpation.  Skin:    General: Skin is warm and dry.     Findings: No erythema or rash.  Neurological:     Mental Status: She is alert and oriented to person, place, and time. Mental status is at baseline.     Cranial Nerves: No cranial nerve deficit.     Coordination: Coordination normal.  Psychiatric:     Comments: Flat affect     LABORATORY DATA:  I have reviewed the data as listed Lab Results  Component Value Date   WBC 4.7 08/28/2021   HGB 14.5 08/28/2021   HCT 43.3 08/28/2021   MCV 87.5 08/28/2021   PLT 94 (L) 08/28/2021   Recent Labs    12/04/20 1527 08/28/21 1143  NA 139 136  K 3.5 3.7  CL 103 103  CO2 29 25  GLUCOSE 284* 271*  BUN 9 11  CREATININE 0.64 0.67  CALCIUM 8.8* 9.0  GFRNONAA >60 >60  PROT 7.6 8.2*  ALBUMIN 4.0 4.1  AST 34 30  ALT 35 25  ALKPHOS 78 83  BILITOT 1.0 0.6    Iron/TIBC/Ferritin/ %Sat No results found for: "IRON", "TIBC", "FERRITIN", "IRONPCTSAT"    RADIOGRAPHIC STUDIES: I have personally reviewed the radiological images as listed and agreed with the findings in the report. NM Bone Scan Whole Body  Result Date: 09/05/2021 CLINICAL DATA:  LEFT breast cancer EXAM: NUCLEAR MEDICINE WHOLE BODY BONE SCAN TECHNIQUE: Whole body anterior and posterior images were obtained approximately 3 hours after intravenous injection of radiopharmaceutical. RADIOPHARMACEUTICALS:  19.11 mCi Technetium-81mMDP IV COMPARISON:  None Correlation: CT  chest abdomen pelvis 08/28/2021 FINDINGS: Uptake in upper lumbar spine at approximately L2, could be degenerative or metastatic; prior CT demonstrates advanced degenerative disc disease changes at L1-L2 and L2-L3 with no definite sclerotic or destructive foci to suggest metastasis. Uptake at shoulders, sternoclavicular joints, hips, knees, feet, typically degenerative No additional worrisome sites of tracer uptake are seen to suggest osseous metastatic disease. Expected urinary tract and soft tissue distribution of tracer. IMPRESSION: Uptake at L2 which is nonspecific but may be related to advanced degenerative disc and facet disease changes at both L1-L2 and L2-L3; no evidence of osseous metastatic disease by CT. No definite osseous metastatic lesions identified. Electronically Signed   By: MLavonia DanaM.D.   On: 09/05/2021 09:36

## 2021-09-28 NOTE — Assessment & Plan Note (Signed)
Recommend empiric fluconazole 100 mg x 1.

## 2021-10-07 ENCOUNTER — Encounter: Payer: Self-pay | Admitting: Licensed Clinical Social Worker

## 2021-10-07 ENCOUNTER — Telehealth: Payer: Self-pay | Admitting: Licensed Clinical Social Worker

## 2021-10-07 ENCOUNTER — Ambulatory Visit: Payer: Self-pay | Admitting: Licensed Clinical Social Worker

## 2021-10-07 DIAGNOSIS — Z1379 Encounter for other screening for genetic and chromosomal anomalies: Secondary | ICD-10-CM

## 2021-10-07 NOTE — Telephone Encounter (Signed)
Revealed negative genetic testing. This normal result is reassuring and indicates that it is unlikely Sharon Woods's cancer is due to a hereditary cause.  It is unlikely that there is an increased risk of another cancer due to a mutation in one of these genes.  However, genetic testing is not perfect, and cannot definitively rule out a hereditary cause.  It will be important for her to keep in contact with genetics to learn if any additional testing may be needed in the future.

## 2021-10-07 NOTE — Progress Notes (Signed)
HPI:  Ms. Trindade was previously seen in the [facility] due to a personal and family history of cancer and concerns regarding a hereditary predisposition to cancer. Please refer to our prior cancer genetics clinic note for more information regarding our discussion, assessment and recommendations, at the time. Ms. Calandra recent genetic test results were disclosed to her, as were recommendations warranted by these results. These results and recommendations are discussed in more detail below.  CANCER HISTORY:  Oncology History  Malignant neoplasm of left breast ([place])  08/06/2021 Mammogram   08/06/2021, digital bilateral mammogram and ultrasound showed left breast 3:00 mass, 1.7 x 1.7 x 1.7 cm, ultrasound of the left axillary is negative. 08/21/2021 right breast 1 x 0.7 x 0.8 cm angulated spiculated mass at the right breast 7:00, 5 cm from nipple.  Ultrasound of the right axillary demonstrates 3 abnormal thickened cortex lymph node. 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 Franki Cabot right breast 7:00, invasive mammary carcinoma with lobular features, in situ carcinoma,  ER+, PR+, HER 2-This was found to be concordant by Dr. Franki Cabot. right axilla lymph node +, extracapsular extension.  -This was found to be concordant by Dr. Franki Cabot   08/28/2021 Initial Diagnosis   Malignant neoplasm of upper-outer quadrant of left breast in female, estrogen receptor positive ([place])   08/28/2021 Cancer Staging   Staging form: Breast, AJCC 8th Edition - Clinical stage from 08/28/2021: Stage IA (cT1c, cN0, cM0, G2, ER+, PR+, HER2-) - Signed by Earlie Server, MD on 08/28/2021 Stage prefix: Initial diagnosis Histologic grading system: 3 grade system   08/28/2021 Imaging   CT CHEST, ABDOMEN, AND PELVIS WITH CONTRAST Asymmetric  mildly enlarged right axillary nodes. Correlate with biopsy. There are some punctate foci of sclerosis in the included spine that could reflect subtle metastatic disease. Nonobstructing bilateral renal calculi.   08/29/2021 Imaging   BILATERAL BREAST MRI WITH AND WITHOUT CONTRAST 1. 2.5 centimeter enhancing mass in the LOWER OUTER QUADRANT of the RIGHT breast, correlating with known malignancy. 2. At least 4 enlarged RIGHT axillary lymph nodes, correlating well with recently biopsied lymph node showing metastatic disease. 3. 3.4 centimeter enhancing mass in the LEFT breast, with an additional 2.9 centimeters of non mass enhancement posterior to the mass also suspicious for malignancy. This likely correlates with the area calcifications seen mammographically. 4. 5 millimeters satellite nodule along the LATERAL aspect of known malignancy in the LOWER OUTER QUADRANT of the LEFT breast. 5. LEFT axilla is negative for adenopathy.       09/05/2021 Imaging   NUCLEAR MEDICINE WHOLE BODY BONE SCAN Uptake at L2 which is nonspecific but may be related to advanced degenerative disc and facet disease changes at both L1-L2 and L2-L3; no evidence of osseous metastatic disease by CT. No definite osseous metastatic lesions identified.   09/23/2021 Surgery   Surgery    Genetic Testing   Negative genetic testing. No pathogenic variants identified on the [facility] CancerNext-Expanded+RNA panel. The report date is 10/03/2021.  The CancerNext-Expanded + RNAinsight gene panel offered by [employer] and includes sequencing and rearrangement analysis for the following 77 genes: IP, ALK, APC*, ATM*, AXIN2, BAP1, BARD1, BLM, BMPR1A, BRCA1*, BRCA2*, BRIP1*, CDC73, CDH1*,CDK4, CDKN1B, CDKN2A, CHEK2*, CTNNA1, DICER1, FANCC, FH, FLCN, GALNT12, KIF1B, LZTR1, MAX, MEN1, MET, MLH1*, MSH2*, MSH3, MSH6*, MUTYH*, NBN, NF1*,  NF2, NTHL1, PALB2*, PHOX2B, PMS2*, POT1, PRKAR1A, PTCH1, PTEN*, RAD51C*, RAD51D*,RB1, RECQL, RET, SDHA, SDHAF2,  SDHB, SDHC, SDHD, SMAD4, SMARCA4, SMARCB1, SMARCE1, STK11, SUFU, TMEM127, TP53*,TSC1, TSC2, VHL and XRCC2 (sequencing and deletion/duplication); EGFR, EGLN1, HOXB13, KIT, MITF, PDGFRA, POLD1 and POLE (sequencing only); EPCAM and GREM1 (deletion/duplication only).   Malignant neoplasm of lower-outer quadrant of right breast of female, estrogen receptor positive ([place])  08/06/2021 Mammogram   08/06/2021, digital bilateral mammogram and ultrasound showed left breast 3:00 mass, 1.7 x 1.7 x 1.7 cm, ultrasound of the left axillary is negative. 08/21/2021 right breast 1 x 0.7 x 0.8 cm angulated spiculated mass at the right breast 7:00, 5 cm from nipple.  Ultrasound of the right axillary demonstrates 3 abnormal thickened cortex lymph node. 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 Franki Cabot right breast 7:00, invasive mammary carcinoma with lobular features, in situ carcinoma,  ER+, PR+, HER 2-This was found to be concordant by Dr. Franki Cabot. right axilla lymph node +, extracapsular extension.  -This was found to be concordant by Dr. Franki Cabot   08/28/2021 Initial Diagnosis   Malignant neoplasm of lower-outer quadrant of right breast of female, estrogen receptor positive ([place])   08/28/2021 Cancer Staging   Staging form: Breast, AJCC 8th Edition - Clinical stage from 08/28/2021: Stage IIA (cT2, cN1, cM0, G2, ER+, PR+, HER2-) - Signed by Earlie Server, MD on 09/09/2021 Stage prefix: Initial diagnosis Histologic grading system: 3 grade system   08/28/2021 Imaging   CT CHEST, ABDOMEN, AND PELVIS WITH CONTRAST Asymmetric mildly enlarged right axillary nodes. Correlate with biopsy. There are some punctate foci of sclerosis in the included spine that could reflect subtle metastatic disease. Nonobstructing bilateral renal calculi.   08/29/2021  Imaging   BILATERAL BREAST MRI WITH AND WITHOUT CONTRAST 1. 2.5 centimeter enhancing mass in the LOWER OUTER QUADRANT of the RIGHT breast, correlating with known malignancy. 2. At least 4 enlarged RIGHT axillary lymph nodes, correlating well with recently biopsied lymph node showing metastatic disease. 3. 3.4 centimeter enhancing mass in the LEFT breast, with an additional 2.9 centimeters of non mass enhancement posterior to the mass also suspicious for malignancy. This likely correlates with the area calcifications seen mammographically. 4. 5 millimeters satellite nodule along the LATERAL aspect of known malignancy in the LOWER OUTER QUADRANT of the LEFT breast. 5. LEFT axilla is negative for adenopathy.       09/05/2021 Imaging   NUCLEAR MEDICINE WHOLE BODY BONE SCAN Uptake at L2 which is nonspecific but may be related to advanced degenerative disc and facet disease changes at both L1-L2 and L2-L3; no evidence of osseous metastatic disease by CT. No definite osseous metastatic lesions identified.   09/09/2021 Oncotype testing   [id number] block RIGHT 7:00 5 CM FN; ULTRASOUND-GUIDED BIOPSY Oncotype Dx RS score 22   09/23/2021 Surgery   Surgery    Genetic Testing   Negative genetic testing. No pathogenic variants identified on the [facility] CancerNext-Expanded+RNA panel. The report date is 10/03/2021.  The CancerNext-Expanded + RNAinsight gene panel offered by [employer] and includes sequencing and rearrangement analysis for the following 77 genes: IP, ALK, APC*, ATM*, AXIN2, BAP1, BARD1, BLM, BMPR1A, BRCA1*, BRCA2*, BRIP1*, CDC73, CDH1*,CDK4, CDKN1B, CDKN2A, CHEK2*, CTNNA1, DICER1, FANCC, FH, FLCN, GALNT12, KIF1B, LZTR1, MAX, MEN1, MET, MLH1*, MSH2*, MSH3, MSH6*, MUTYH*, NBN, NF1*, NF2, NTHL1, PALB2*, PHOX2B, PMS2*,  POT1, PRKAR1A, PTCH1, PTEN*, RAD51C*, RAD51D*,RB1, RECQL, RET, SDHA, SDHAF2, SDHB, SDHC, SDHD, SMAD4, SMARCA4, SMARCB1, SMARCE1, STK11, SUFU, TMEM127, TP53*,TSC1, TSC2, VHL and  XRCC2 (sequencing and deletion/duplication); EGFR, EGLN1, HOXB13, KIT, MITF, PDGFRA, POLD1 and POLE (sequencing only); EPCAM and GREM1 (deletion/duplication only).     FAMILY HISTORY:  We obtained a detailed, 4-generation family history.  Significant diagnoses are listed below: Family History  Problem Relation Age of Onset   Lung cancer Mother    Dementia Mother    Parkinson's disease Father    Cancer Father        unk type   Diabetes Sister    Heart attack Sister    Breast cancer Sister    Cancer Maternal Uncle        unk types   Dementia Maternal Grandmother    Cancer Maternal Grandmother        unk type   Cancer Other    Dementia Other     Ms. Sampley has 2 sons, 19 and 62. She has 2 brothers and 1 sister. Her sister had breast cancer in her 74s and passed at 52. She has a nephew that has tumors, unsure type.   Ms. Loth mother died in her 50s-80s and had lung cancer. Patient had 6 maternal uncles, 1 aunt. Two uncles had cancers, unknown types. Maternal grandmother had cancer, unknown type.    Ms. Petti father died in his 65s and also had cancer, unknown type. No other known cancers on this side of the family.   Ms. Kavan is unaware of previous family history of genetic testing for hereditary cancer risks.There is no reported Ashkenazi Jewish ancestry. There is no known consanguinity.       GENETIC TEST RESULTS: Genetic testing reported out on 10/03/2021 through the [facility] CancerNext-Expanded+RNA cancer panel found no pathogenic mutations.   The CancerNext-Expanded + RNAinsight gene panel offered by [employer] and includes sequencing and rearrangement analysis for the following 77 genes: IP, ALK, APC*, ATM*, AXIN2, BAP1, BARD1, BLM, BMPR1A, BRCA1*, BRCA2*, BRIP1*, CDC73, CDH1*,CDK4, CDKN1B, CDKN2A, CHEK2*, CTNNA1, DICER1, FANCC, FH, FLCN, GALNT12, KIF1B, LZTR1, MAX, MEN1, MET, MLH1*, MSH2*, MSH3, MSH6*, MUTYH*, NBN, NF1*, NF2, NTHL1, PALB2*, PHOX2B, PMS2*, POT1,  PRKAR1A, PTCH1, PTEN*, RAD51C*, RAD51D*,RB1, RECQL, RET, SDHA, SDHAF2, SDHB, SDHC, SDHD, SMAD4, SMARCA4, SMARCB1, SMARCE1, STK11, SUFU, TMEM127, TP53*,TSC1, TSC2, VHL and XRCC2 (sequencing and deletion/duplication); EGFR, EGLN1, HOXB13, KIT, MITF, PDGFRA, POLD1 and POLE (sequencing only); EPCAM and GREM1 (deletion/duplication only).   The test report has been scanned into EPIC and is located under the Molecular Pathology section of the Results Review tab.  A portion of the result report is included below for reference.    We discussed that because current genetic testing is not perfect, it is possible there may be a gene mutation in one of these genes that current testing cannot detect, but that chance is small.  There could be another gene that has not yet been discovered, or that we have not yet tested, that is responsible for the cancer diagnoses in the family. It is also possible there is a hereditary cause for the cancer in the family that Ms. Derner did not inherit and therefore was not identified in her testing.  Therefore, it is important to remain in touch with cancer genetics in the future so that we can continue to offer Ms. Mott the most up to date genetic testing.   ADDITIONAL GENETIC TESTING: We discussed with Ms. Weitman that her genetic testing was fairly extensive.  If there are genes identified to increase cancer risk that can be analyzed in the future, we would be happy to discuss and coordinate this testing at that time.    CANCER SCREENING RECOMMENDATIONS: Ms. Moffit test result is considered negative (normal).  This means that we have not identified a hereditary cause for her  personal and family history of cancer at this time. Most cancers happen by chance and this negative test suggests that her cancer may fall into this category.    While reassuring, this does not definitively rule out a hereditary predisposition to cancer. It is still possible that there could be genetic  mutations that are undetectable by current technology. There could be genetic mutations in genes that have not been tested or identified to increase cancer risk.  Therefore, it is recommended she continue to follow the cancer management and screening guidelines provided by her oncology and primary healthcare provider.   An individual's cancer risk and medical management are not determined by genetic test results alone. Overall cancer risk assessment incorporates additional factors, including personal medical history, family history, and any available genetic information that may result in a personalized plan for cancer prevention and surveillance.  RECOMMENDATIONS FOR FAMILY MEMBERS:  Relatives in this family might be at some increased risk of developing cancer, over the general population risk, simply due to the family history of cancer.  We recommended female relatives in this family have a yearly mammogram beginning at age 21, or 56 years younger than the earliest onset of cancer, an annual clinical breast exam, and perform monthly breast self-exams. Female relatives in this family should also have a gynecological exam as recommended by their primary provider.  All family members should be referred for colonoscopy starting at age 38.   FOLLOW-UP: Lastly, we discussed with Ms. Rickel that cancer genetics is a rapidly advancing field and it is possible that new genetic tests will be appropriate for her and/or her family members in the future. We encouraged her to remain in contact with cancer genetics on an annual basis so we can update her personal and family histories and let her know of advances in cancer genetics that may benefit this family.   Our contact number was provided. Ms. Granlund questions were answered to her satisfaction, and she knows she is welcome to call us at anytime with additional questions or concerns.   Faith Rogue, MS, [facility] Genetic  [email or web address] Phone: [phone]

## 2021-10-08 ENCOUNTER — Inpatient Hospital Stay: Payer: Medicaid Other | Attending: Oncology

## 2021-10-08 ENCOUNTER — Telehealth: Payer: Self-pay | Admitting: *Deleted

## 2021-10-08 ENCOUNTER — Other Ambulatory Visit: Payer: Self-pay

## 2021-10-08 ENCOUNTER — Encounter: Payer: Self-pay | Admitting: Nurse Practitioner

## 2021-10-08 ENCOUNTER — Inpatient Hospital Stay (HOSPITAL_BASED_OUTPATIENT_CLINIC_OR_DEPARTMENT_OTHER): Payer: Medicaid Other | Admitting: Nurse Practitioner

## 2021-10-08 ENCOUNTER — Other Ambulatory Visit: Payer: Self-pay | Admitting: *Deleted

## 2021-10-08 ENCOUNTER — Encounter: Payer: Self-pay | Admitting: *Deleted

## 2021-10-08 VITALS — BP 138/85 | HR 53 | Temp 97.8°F | Resp 18 | Wt 179.0 lb

## 2021-10-08 DIAGNOSIS — C50511 Malignant neoplasm of lower-outer quadrant of right female breast: Secondary | ICD-10-CM

## 2021-10-08 DIAGNOSIS — Z801 Family history of malignant neoplasm of trachea, bronchus and lung: Secondary | ICD-10-CM | POA: Insufficient documentation

## 2021-10-08 DIAGNOSIS — Z5986 Financial insecurity: Secondary | ICD-10-CM

## 2021-10-08 DIAGNOSIS — R300 Dysuria: Secondary | ICD-10-CM | POA: Diagnosis not present

## 2021-10-08 DIAGNOSIS — D696 Thrombocytopenia, unspecified: Secondary | ICD-10-CM

## 2021-10-08 DIAGNOSIS — Z87891 Personal history of nicotine dependence: Secondary | ICD-10-CM | POA: Insufficient documentation

## 2021-10-08 DIAGNOSIS — L299 Pruritus, unspecified: Secondary | ICD-10-CM | POA: Insufficient documentation

## 2021-10-08 DIAGNOSIS — E1169 Type 2 diabetes mellitus with other specified complication: Secondary | ICD-10-CM

## 2021-10-08 DIAGNOSIS — Z7984 Long term (current) use of oral hypoglycemic drugs: Secondary | ICD-10-CM | POA: Diagnosis not present

## 2021-10-08 DIAGNOSIS — Z170 Estrogen receptor positive status [ER+]: Secondary | ICD-10-CM

## 2021-10-08 DIAGNOSIS — F323 Major depressive disorder, single episode, severe with psychotic features: Secondary | ICD-10-CM

## 2021-10-08 DIAGNOSIS — B3731 Acute candidiasis of vulva and vagina: Secondary | ICD-10-CM | POA: Insufficient documentation

## 2021-10-08 DIAGNOSIS — N390 Urinary tract infection, site not specified: Secondary | ICD-10-CM | POA: Diagnosis not present

## 2021-10-08 DIAGNOSIS — Z809 Family history of malignant neoplasm, unspecified: Secondary | ICD-10-CM | POA: Diagnosis not present

## 2021-10-08 DIAGNOSIS — E1165 Type 2 diabetes mellitus with hyperglycemia: Secondary | ICD-10-CM | POA: Diagnosis not present

## 2021-10-08 DIAGNOSIS — N760 Acute vaginitis: Secondary | ICD-10-CM

## 2021-10-08 DIAGNOSIS — E119 Type 2 diabetes mellitus without complications: Secondary | ICD-10-CM

## 2021-10-08 DIAGNOSIS — C50412 Malignant neoplasm of upper-outer quadrant of left female breast: Secondary | ICD-10-CM | POA: Insufficient documentation

## 2021-10-08 LAB — CBC WITH DIFFERENTIAL/PLATELET
Abs Immature Granulocytes: 0.01 10*3/uL (ref 0.00–0.07)
Basophils Absolute: 0 10*3/uL (ref 0.0–0.1)
Basophils Relative: 1 %
Eosinophils Absolute: 0 10*3/uL (ref 0.0–0.5)
Eosinophils Relative: 1 %
HCT: 41.3 % (ref 36.0–46.0)
Hemoglobin: 14 g/dL (ref 12.0–15.0)
Immature Granulocytes: 0 %
Lymphocytes Relative: 35 %
Lymphs Abs: 1.5 10*3/uL (ref 0.7–4.0)
MCH: 29.8 pg (ref 26.0–34.0)
MCHC: 33.9 g/dL (ref 30.0–36.0)
MCV: 87.9 fL (ref 80.0–100.0)
Monocytes Absolute: 0.2 10*3/uL (ref 0.1–1.0)
Monocytes Relative: 5 %
Neutro Abs: 2.5 10*3/uL (ref 1.7–7.7)
Neutrophils Relative %: 58 %
Platelets: 86 10*3/uL — ABNORMAL LOW (ref 150–400)
RBC: 4.7 MIL/uL (ref 3.87–5.11)
RDW: 13.7 % (ref 11.5–15.5)
WBC: 4.2 10*3/uL (ref 4.0–10.5)
nRBC: 0 % (ref 0.0–0.2)

## 2021-10-08 LAB — COMPREHENSIVE METABOLIC PANEL
ALT: 25 U/L (ref 0–44)
AST: 31 U/L (ref 15–41)
Albumin: 4.1 g/dL (ref 3.5–5.0)
Alkaline Phosphatase: 68 U/L (ref 38–126)
Anion gap: 8 (ref 5–15)
BUN: 12 mg/dL (ref 6–20)
CO2: 23 mmol/L (ref 22–32)
Calcium: 8.9 mg/dL (ref 8.9–10.3)
Chloride: 107 mmol/L (ref 98–111)
Creatinine, Ser: 0.58 mg/dL (ref 0.44–1.00)
GFR, Estimated: >60 mL/min (ref >60)
Glucose, Bld: 159 mg/dL — ABNORMAL HIGH (ref 70–99)
Potassium: 3.4 mmol/L — ABNORMAL LOW (ref 3.5–5.1)
Sodium: 138 mmol/L (ref 135–145)
Total Bilirubin: 1 mg/dL (ref 0.3–1.2)
Total Protein: 7.6 g/dL (ref 6.5–8.1)

## 2021-10-08 LAB — URINALYSIS, COMPLETE (UACMP) WITH MICROSCOPIC
Bilirubin Urine: NEGATIVE
Glucose, UA: NEGATIVE mg/dL
Ketones, ur: 5 mg/dL — AB
Nitrite: NEGATIVE
Protein, ur: 30 mg/dL — AB
RBC / HPF: >50 RBC/hpf — ABNORMAL HIGH (ref 0–5)
Specific Gravity, Urine: 1.021 (ref 1.005–1.030)
pH: 5 (ref 5.0–8.0)

## 2021-10-08 LAB — HIV ANTIBODY (ROUTINE TESTING W REFLEX): HIV Screen 4th Generation wRfx: NONREACTIVE

## 2021-10-08 LAB — VITAMIN B12: Vitamin B-12: 511 pg/mL (ref 180–914)

## 2021-10-08 LAB — HEPATITIS PANEL, ACUTE
HCV Ab: NONREACTIVE
Hep A IgM: NONREACTIVE
Hep B C IgM: NONREACTIVE
Hepatitis B Surface Ag: NONREACTIVE

## 2021-10-08 LAB — FOLATE: Folate: 32 ng/mL (ref >5.9)

## 2021-10-08 MED ORDER — GLIPIZIDE 5 MG PO TABS: 5.0000 mg | ORAL_TABLET | Freq: Every day | ORAL | 1 refills | Status: DC

## 2021-10-08 MED ORDER — TRIAMCINOLONE ACETONIDE 0.1 % EX CREA: 1.0000 | TOPICAL_CREAM | Freq: Two times a day (BID) | CUTANEOUS | 0 refills | Status: DC

## 2021-10-08 MED ORDER — FLUCONAZOLE 150 MG PO TABS: 150.0000 mg | ORAL_TABLET | Freq: Every day | ORAL | 0 refills | Status: DC

## 2021-10-08 MED ORDER — NITROFURANTOIN MONOHYD MACRO 100 MG PO CAPS: 100.0000 mg | ORAL_CAPSULE | Freq: Two times a day (BID) | ORAL | 0 refills | Status: AC

## 2021-10-08 NOTE — Patient Instructions (Signed)
Please call the [facility] at [phone] to establish care with a Primary Care Provider.   In the meantime, you can access care through the following free and reduced cost health care locations in [place]:   -[facility] ([address], [place], [place] [address]- [phone])  -[facility] ([address], [address], [place] [address]- [phone])  -[facility] ([address], [place], [place] [address]- [phone])  -[facility] ([address], [place]- [phone])  - [facility] [place]- [phone])  

## 2021-10-08 NOTE — Progress Notes (Signed)
Patient here for follow-up. Pt states that her glucose readings are around 167.  Pt reports pelvic pressure. Recently treated for yeast infection. Pelvic pressure radiates to her lower back.

## 2021-10-08 NOTE — Telephone Encounter (Signed)
Dr. Tasia Catchings discussed this with Ander Purpura and issue will be addressed today at appt.

## 2021-10-08 NOTE — Progress Notes (Signed)
Met with patient during visit with Beckey Rutter NP.   Pt given names/phone numbers of several area providers to call for appt. To become established with PCP.   Chaperoned during pelvic exam.

## 2021-10-08 NOTE — Telephone Encounter (Signed)
Patient called and left a message with answering service stating that she wants a return call when office opens to discuss Depression on her chart and that she does not have depression and wants to know why it is on there. Please return her call   Severe major depression, single episode, with psychotic features, mood-congruent ([place]) Not on any antidepressants.  Refer to psychiatrist.

## 2021-10-08 NOTE — Progress Notes (Unsigned)
Sympt[facility] Management [place] [facility] [place]. [facility] [address] [employer] [place], [place] [address] [phone] (phone) [phone] (fax)  Patient Care Team: Patient, No Pcp Per as PCP - General (General Practice) Brannock, Heath Gold, RN Earlie Server, MD as Consulting Physician (Oncology) Daiva Huge, RN as Oncology Nurse Navigator   Name of the patient: Sharon Woods  [id number]  01-07-1961   Date of visit: 10/08/21  Diagnosis- Lobular Breast cancer  Chief complaint/ Reason for visit- itching skin, elevated blood sugars, vulvar itching  Heme/Onc history:  Oncology History  Malignant neoplasm of left breast ([place])  08/06/2021 Mammogram   08/06/2021, digital bilateral mammogram and ultrasound showed left breast 3:00 mass, 1.7 x 1.7 x 1.7 cm, ultrasound of the left axillary is negative. 08/21/2021 right breast 1 x 0.7 x 0.8 cm angulated spiculated mass at the right breast 7:00, 5 cm from nipple.  Ultrasound of the right axillary demonstrates 3 abnormal thickened cortex lymph node. 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 Franki Cabot right breast 7:00, invasive mammary carcinoma with lobular features, in situ carcinoma,  ER+, PR+, HER 2-This was found to be concordant by Dr. Franki Cabot. right axilla lymph node +, extracapsular extension.  -This was found to be concordant by Dr. Franki Cabot   08/28/2021 Initial Diagnosis   Malignant neoplasm of upper-outer quadrant of left breast in female, estrogen receptor positive ([place])   08/28/2021 Cancer Staging   Staging form: Breast, AJCC 8th Edition - Clinical stage from 08/28/2021: Stage IA (cT1c, cN0, cM0, G2, ER+, PR+, HER2-) - Signed by Earlie Server, MD on 08/28/2021 Stage prefix: Initial  diagnosis Histologic grading system: 3 grade system   08/28/2021 Imaging   CT CHEST, ABDOMEN, AND PELVIS WITH CONTRAST Asymmetric mildly enlarged right axillary nodes. Correlate with biopsy. There are some punctate foci of sclerosis in the included spine that could reflect subtle metastatic disease. Nonobstructing bilateral renal calculi.   08/29/2021 Imaging   BILATERAL BREAST MRI WITH AND WITHOUT CONTRAST 1. 2.5 centimeter enhancing mass in the LOWER OUTER QUADRANT of the RIGHT breast, correlating with known malignancy. 2. At least 4 enlarged RIGHT axillary lymph nodes, correlating well with recently biopsied lymph node showing metastatic disease. 3. 3.4 centimeter enhancing mass in the LEFT breast, with an additional 2.9 centimeters of non mass enhancement posterior to the mass also suspicious for malignancy. This likely correlates with the area calcifications seen mammographically. 4. 5 millimeters satellite nodule along the LATERAL aspect of known malignancy in the LOWER OUTER QUADRANT of the LEFT breast. 5. LEFT axilla is negative for adenopathy.       09/05/2021 Imaging   NUCLEAR MEDICINE WHOLE BODY BONE SCAN Uptake at L2 which is nonspecific but may be related to advanced degenerative disc and facet disease changes at both L1-L2 and L2-L3; no evidence of osseous metastatic disease by CT. No definite osseous metastatic lesions identified.   09/23/2021 Surgery   Surgery    Genetic Testing   Negative genetic testing. No pathogenic variants identified on the [facility] CancerNext-Expanded+RNA panel. The report date is 10/03/2021.  The CancerNext-Expanded + RNAinsight gene panel offered by [employer] and includes sequencing and rearrangement analysis for the following 77 genes: IP, ALK, APC*, ATM*, AXIN2, BAP1, BARD1, BLM, BMPR1A, BRCA1*, BRCA2*,  BRIP1*, CDC73, CDH1*,CDK4, CDKN1B, CDKN2A, CHEK2*, CTNNA1, DICER1, FANCC, FH, FLCN, GALNT12, KIF1B, LZTR1, MAX, MEN1, MET, MLH1*, MSH2*, MSH3,  MSH6*, MUTYH*, NBN, NF1*, NF2, NTHL1, PALB2*, PHOX2B, PMS2*, POT1, PRKAR1A, PTCH1, PTEN*, RAD51C*, RAD51D*,RB1, RECQL, RET, SDHA, SDHAF2, SDHB, SDHC, SDHD, SMAD4, SMARCA4, SMARCB1, SMARCE1, STK11, SUFU, TMEM127, TP53*,TSC1, TSC2, VHL and XRCC2 (sequencing and deletion/duplication); EGFR, EGLN1, HOXB13, KIT, MITF, PDGFRA, POLD1 and POLE (sequencing only); EPCAM and GREM1 (deletion/duplication only).   Malignant neoplasm of lower-outer quadrant of right breast of female, estrogen receptor positive ([place])  08/06/2021 Mammogram   08/06/2021, digital bilateral mammogram and ultrasound showed left breast 3:00 mass, 1.7 x 1.7 x 1.7 cm, ultrasound of the left axillary is negative. 08/21/2021 right breast 1 x 0.7 x 0.8 cm angulated spiculated mass at the right breast 7:00, 5 cm from nipple.  Ultrasound of the right axillary demonstrates 3 abnormal thickened cortex lymph node. 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 Franki Cabot right breast 7:00, invasive mammary carcinoma with lobular features, in situ carcinoma,  ER+, PR+, HER 2-This was found to be concordant by Dr. Franki Cabot. right axilla lymph node +, extracapsular extension.  -This was found to be concordant by Dr. Franki Cabot   08/28/2021 Initial Diagnosis   Malignant neoplasm of lower-outer quadrant of right breast of female, estrogen receptor positive ([place])   08/28/2021 Cancer Staging   Staging form: Breast, AJCC 8th Edition - Clinical stage from 08/28/2021: Stage IIA (cT2, cN1, cM0, G2, ER+, PR+, HER2-) - Signed by Earlie Server, MD on 09/09/2021 Stage prefix: Initial diagnosis Histologic grading system: 3 grade system   08/28/2021 Imaging   CT CHEST, ABDOMEN, AND PELVIS WITH CONTRAST Asymmetric mildly enlarged right axillary nodes. Correlate with biopsy. There are some punctate foci  of sclerosis in the included spine that could reflect subtle metastatic disease. Nonobstructing bilateral renal calculi.   08/29/2021 Imaging   BILATERAL BREAST MRI WITH AND WITHOUT CONTRAST 1. 2.5 centimeter enhancing mass in the LOWER OUTER QUADRANT of the RIGHT breast, correlating with known malignancy. 2. At least 4 enlarged RIGHT axillary lymph nodes, correlating well with recently biopsied lymph node showing metastatic disease. 3. 3.4 centimeter enhancing mass in the LEFT breast, with an additional 2.9 centimeters of non mass enhancement posterior to the mass also suspicious for malignancy. This likely correlates with the area calcifications seen mammographically. 4. 5 millimeters satellite nodule along the LATERAL aspect of known malignancy in the LOWER OUTER QUADRANT of the LEFT breast. 5. LEFT axilla is negative for adenopathy.       09/05/2021 Imaging   NUCLEAR MEDICINE WHOLE BODY BONE SCAN Uptake at L2 which is nonspecific but may be related to advanced degenerative disc and facet disease changes at both L1-L2 and L2-L3; no evidence of osseous metastatic disease by CT. No definite osseous metastatic lesions identified.   09/09/2021 Oncotype testing   [id number] block RIGHT 7:00 5 CM FN; ULTRASOUND-GUIDED BIOPSY Oncotype Dx RS score 22   09/23/2021 Surgery   Surgery    Genetic Testing   Negative genetic testing. No pathogenic variants identified on the [facility] CancerNext-Expanded+RNA panel. The report date is 10/03/2021.  The CancerNext-Expanded + RNAinsight gene panel offered by [employer] and includes sequencing and rearrangement analysis for the following 77 genes: IP, ALK, APC*, ATM*, AXIN2, BAP1, BARD1, BLM, BMPR1A, BRCA1*, BRCA2*, BRIP1*, CDC73, CDH1*,CDK4, CDKN1B, CDKN2A,  CHEK2*, CTNNA1, DICER1, FANCC, FH, FLCN, GALNT12, KIF1B, LZTR1, MAX, MEN1, MET, MLH1*, MSH2*, MSH3, MSH6*, MUTYH*, NBN, NF1*, NF2, NTHL1, PALB2*, PHOX2B, PMS2*, POT1, PRKAR1A, PTCH1, PTEN*, RAD51C*,  RAD51D*,RB1, RECQL, RET, SDHA, SDHAF2, SDHB, SDHC, SDHD, SMAD4, SMARCA4, SMARCB1, SMARCE1, STK11, SUFU, TMEM127, TP53*,TSC1, TSC2, VHL and XRCC2 (sequencing and deletion/duplication); EGFR, EGLN1, HOXB13, KIT, MITF, PDGFRA, POLD1 and POLE (sequencing only); EPCAM and GREM1 (deletion/duplication only).     Interval history- Patient is 61 year old female  newly diagnosed with lobular breast cancer currently on neoadjuvant endocrine therapy. Recommendation was for bilateral mastectomy however, hA1c was found to be greater than 10 and surgery is on hold. She has started metformin and reports tolerating it moderately well with some diarrhea. She has ongoing vulvar itching and irritation. Unaware of discharge. Was recently treated with one tablet of diflucan empirically. She reports checking her blood sugars but is unsure of numbers and hasn't brought her meter.   ECOG FS:1 - Symptomatic but completely ambulatory  Review of systems- Review of Systems  Constitutional:  Positive for malaise/fatigue. Negative for chills, fever and weight loss.  HENT:  Negative for hearing loss, nosebleeds, sore throat and tinnitus.   Eyes:  Negative for blurred vision and double vision.  Respiratory:  Negative for cough, hemoptysis, shortness of breath and wheezing.   Cardiovascular:  Negative for chest pain, palpitations and leg swelling.  Gastrointestinal:  Negative for abdominal pain, blood in stool, constipation, diarrhea, melena, nausea and vomiting.  Genitourinary:  Positive for dysuria and flank pain. Negative for frequency, hematuria and urgency.  Musculoskeletal:  Positive for back pain. Negative for falls, joint pain and myalgias.  Skin:  Negative for itching and rash.  Neurological:  Negative for dizziness, tingling, sensory change, loss of consciousness, weakness and headaches.  Endo/Heme/Allergies:  Positive for polydipsia. Negative for environmental allergies. Does not bruise/bleed easily.   Psychiatric/Behavioral:  Positive for depression. The patient is nervous/anxious. The patient does not have insomnia.     No Known Allergies  Patient Active Problem List   Diagnosis Date Noted   Diabetes mellitus ([place]) 10/08/2021   Genetic testing 10/07/2021   Diabetes ([place]) 09/28/2021   Vaginitis 09/28/2021   Thrombocytopenia ([place]) 09/08/2021   Malignant neoplasm of left breast ([place]) 08/28/2021   Malignant neoplasm of lower-outer quadrant of right breast of female, estrogen receptor positive ([place]) 08/28/2021   Goals of care, counseling/discussion 08/28/2021   Severe recurrent major depression with psychotic features ([place]) 11/15/2015   Hypertension 11/15/2015   Noncompliance 11/15/2015   Severe major depression, single episode, with psychotic features, mood-congruent ([place]) 03/29/2015   Protein-calorie malnutrition, severe 03/26/2015   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   Past Medical History:  Diagnosis Date   Anxiety    History of high cholesterol 2000   Hypertension    Patient denies medical problems    Past Surgical History:  Procedure Laterality Date   ABDOMINAL HYSTERECTOMY     BREAST BIOPSY Right 08/21/2021   [place] bx 7:00 mass coil clip path pending   BREAST BIOPSY Right 08/21/2021   [place] bx of LN, hydro marker, path pending   BREAST BIOPSY Left 08/21/2021   [place] bx, heart marker, path pending   CESAREAN SECTION     x2   NO PAST SURGERIES     Social History   Socioeconomic History   Marital status: Married    Spouse name: Not on file   Number of children: Not on file   Years of education: Not on file   Highest education  level: Not on file  Occupational History   Not on file  Tobacco Use   Smoking status: Former    Types: Cigarettes    Quit date: 2013    Years since quitting: 10.5   Smokeless tobacco: Never  Vaping Use   Vaping Use: Never used  Substance and Sexual Activity   Alcohol use: Yes    Comment: occasionally   Drug use: No   Sexual activity: Not Currently     Comment: unable to assess   Other Topics Concern   Not on file  Social History Narrative   Not on file   Social Determinants of Health   Financial Resource Strain: Not on file  Food Insecurity: Not on file  Transportation Needs: Unmet Transportation Needs (09/16/2021)   PRAPARE - Hydrologist (Medical): Yes    Lack of Transportation (Non-Medical): Yes  Physical Activity: Not on file  Stress: Not on file  Social Connections: Not on file  Intimate Partner Violence: Not on file   Family History  Problem Relation Age of Onset   Lung cancer Mother    Dementia Mother    Parkinson's disease Father    Cancer Father        unk type   Diabetes Sister    Heart attack Sister    Breast cancer Sister    Cancer Maternal Uncle        unk types   Dementia Maternal Grandmother    Cancer Maternal Grandmother        unk type   Cancer Other    Dementia Other     Current Outpatient Medications:    acetaminophen (TYLENOL) 500 MG tablet, Take 500 mg by mouth every 6 (six) hours as needed., Disp: , Rfl:    blood glucose meter kit and supplies KIT, Dispense based on patient and insurance preference. Use up to four times daily as directed., Disp: 1 each, Rfl: 1   calcium carbonate (TUMS - DOSED IN MG ELEMENTAL CALCIUM) 500 MG chewable tablet, Chew 1 tablet by mouth daily., Disp: , Rfl:    fluconazole (DIFLUCAN) 100 MG tablet, Take 2 tablets (200 mg total) by mouth daily for 1 day, THEN 1 tablet (100 mg total) daily for 6 days., Disp: 8 tablet, Rfl: 0   letrozole (FEMARA) 2.5 MG tablet, Take 1 tablet (2.5 mg total) by mouth daily., Disp: 30 tablet, Rfl: 0   metFORMIN (GLUCOPHAGE) 500 MG tablet, Take 1 tablet (500 mg total) by mouth daily., Disp: 30 tablet, Rfl: 0   nitrofurantoin, macrocrystal-monohydrate, (MACROBID) 100 MG capsule, Take 1 capsule (100 mg total) by mouth 2 (two) times daily for 5 days., Disp: 10 capsule, Rfl: 0   ACCU-CHEK GUIDE test strip, USE TO CHECK  BLOOD SUGAR UP TO 4 TIMES DAILY AS DIRECTED, Disp: , Rfl:    Accu-Chek Softclix Lancets lancets, SMARTSIG:Topical 1-4 Times Daily, Disp: , Rfl:    glipiZIDE (GLUCOTROL) 5 MG tablet, Take 1 tablet (5 mg total) by mouth daily before breakfast., Disp: 30 tablet, Rfl: 1   triamcinolone cream (KENALOG) 0.1 %, Apply 1 Application topically 2 (two) times daily., Disp: 80 g, Rfl: 0  Physical exam:  Vitals:   10/08/21 1257  BP: 138/85  Pulse: (!) 53  Resp: 18  Temp: 97.8 F (36.6 C)  TempSrc: Tympanic  Weight: 179 lb (81.2 kg)   Physical Exam Exam conducted with a chaperone present.  Constitutional:      General: She is not  in acute distress.    Appearance: She is well-developed. She is not ill-appearing.  HENT:     Head: Atraumatic.     Mouth/Throat:     Pharynx: No oropharyngeal exudate.  Eyes:     General: No scleral icterus. Cardiovascular:     Rate and Rhythm: Normal rate and regular rhythm.  Pulmonary:     Effort: Pulmonary effort is normal.     Breath sounds: Normal breath sounds.  Abdominal:     General: Bowel sounds are normal. There is no distension.     Palpations: Abdomen is soft.     Tenderness: There is no abdominal tenderness.  Genitourinary:    Pubic Area: No rash or pubic lice.      Comments: Pelvic exam chaperoned by Wilhemena Durie, RN. Patient consent was obtained before proceeding. On external exam, labia majora are erythematous, moist, malodorous. White discharge scattered across peri-rectal tissue, labia. Able to wipe off easily. On speculum exam, redness of the vaginal mucosa. Minimal discharge. Cervix not visualized- prior surgical hysterectomy. Bimanual deferred.  Musculoskeletal:        General: No tenderness or deformity. Normal range of motion.     Cervical back: Normal range of motion and neck supple.  Skin:    General: Skin is warm and dry.     Coloration: Skin is not pale.     Findings: Rash (scattered erythematous lesions with and without ulceration scattered  on lower extremities. Per patient- bug bites. Well healed scar at bra line right breast 7:00. Patient reported pruritis- ant was found. No obvious bites.) present.  Neurological:     General: No focal deficit present.     Mental Status: She is alert and oriented to person, place, and time.  Psychiatric:        Attention and Perception: Attention normal.        Mood and Affect: Affect is flat.        Speech: Speech is delayed and tangential.        Behavior: Behavior is cooperative.        Latest Ref Rng & Units 10/08/2021   12:01 PM  CMP  Glucose 70 - 99 mg/dL 159   BUN 6 - 20 mg/dL 12   Creatinine 0.44 - 1.00 mg/dL 0.58   Sodium 135 - 145 mmol/L 138   Potassium 3.5 - 5.1 mmol/L 3.4   Chloride 98 - 111 mmol/L 107   CO2 22 - 32 mmol/L 23   Calcium 8.9 - 10.3 mg/dL 8.9   Total Protein 6.5 - 8.1 g/dL 7.6   Total Bilirubin 0.3 - 1.2 mg/dL 1.0   Alkaline Phos 38 - 126 U/L 68   AST 15 - 41 U/L 31   ALT 0 - 44 U/L 25       Latest Ref Rng & Units 10/08/2021   12:01 PM  CBC  WBC 4.0 - 10.5 K/uL 4.2   Hemoglobin 12.0 - 15.0 g/dL 14.0   Hematocrit 36.0 - 46.0 % 41.3   Platelets 150 - 400 K/uL 86    Urinalysis    Component Value Date/Time   COLORURINE AMBER (A) 10/08/2021 1412   APPEARANCEUR HAZY (A) 10/08/2021 1412   APPEARANCEUR Hazy 10/07/2013 1844   LABSPEC 1.021 10/08/2021 1412   LABSPEC 1.025 10/07/2013 1844   PHURINE 5.0 10/08/2021 1412   GLUCOSEU NEGATIVE 10/08/2021 1412   GLUCOSEU Negative 10/07/2013 1844   HGBUR LARGE (A) 10/08/2021 1412   BILIRUBINUR NEGATIVE 10/08/2021 1412   BILIRUBINUR  Negative 10/07/2013 1844   KETONESUR 5 (A) 10/08/2021 1412   PROTEINUR 30 (A) 10/08/2021 1412   NITRITE NEGATIVE 10/08/2021 1412   LEUKOCYTESUR TRACE (A) 10/08/2021 1412   LEUKOCYTESUR Trace 10/07/2013 1844   No images are attached to the encounter.  No results found.  Assessment and plan- Patient is a 61 y.o. female diagnosed with lobular breast cancer who presents to  clinic for multiple complaints.  Lobular breast cancer- recommendation was for bilateral mastectomy. On hold d/t surgical risk increased with hA1c > 10.2% (see below). Currently on neo-adjuvant endocrine therapy. Tolerating well.  Candidiasis- urinary and vulvar. likely related to elevated blood sugars. May not be an ideal candidate for SGLT2 inhibitors as it sounds like she's struggled with yeast for some time. Yeast on UA and on pelvic exam. Encouraged her to keep area clean and dry. Consider holding underwear at night, loose fitting clothing. Start diflucan 200 mg once on day 1 then 100 mg daily for 6 days.  UTI- likely related to hyperglycemia. UA reviewed. Culture pending. Start empiric treatment with macrobid 100 mg BID x 5 days.  Diabetes Mellitus- type 2. HgA1c > 10. We reviewed in detail how elevated blood sugars occur, impact of nutrition on blood sugar. Reviewed how to check blood sugars, frequency, etc. Poor candidate for insulin. She has started metformin and appears to have improved control of sugars. Last reading 163. Did not bring meter to verify. Recommended checking sugar at least once a day. If consistently getting readings > 200, notify clinic. If readings < 70, notify clinic. Reviewed symptoms of hypoglycemia and how to manage. Will start second medication. Given her financial concerns, risk of poor compliance, and urinary/vulvar symptoms, I elected to start her on a sulfonylurea/glipizide. Start with conservative dose of 5 mg once daily after breakfast. Cautioned against hypoglycemia. Consider starting ace-I at next visit for nephro-protective benefits. Nurse Navigator, [place], will reinforce teaching with patient by phone given her limited insight into her disease process and multiple comorbid conditions. She will also meet with dietician today.  Financial insecurity/Underinsured- patient has medicaid pending. She may also quality for some form of disability. May also need assistance  navigating resources available to her given her history of psychiatric illness and medical illiteracy.  No PCP- Patient denies having established care with a Primary Care Provider. Will assist her with getting appointment with PCP to establish care. We also discussed local free and sliding scale clinics which provide affordable care, and often have onsite pharmacies, such as: [facility] ([address] [place], [place] [address]- [phone]), [facility] ([address] [place], [place] [address]- [phone]), [facility] ([address] [place], [place] [address]- [phone]), [place] ([place] [phone]), and [place] [facility] [phone]).   Disposition: 2 weeks- lab (cbc, cmp), and see me- la    Visit Diagnosis 1. Type 2 diabetes mellitus with other specified complication, without long-term current use of insulin ([place])   2. Dysuria   3. Vulvovaginal candidiasis   4. Pruritus   5. Financial insecurity   6. Malignant neoplasm of lower-outer quadrant of right breast of female, estrogen receptor positive ([place])    Patient expressed understanding and was in agreement with this plan. She also understands that She can call clinic at any time with any questions, concerns, or complaints.   Thank you for allowing me to  participate in the care of this very pleasant patient.   Beckey Rutter, DNP, AGNP-C [facility] [phone]  CC: Dr. Tasia Catchings, Dr. Christian Mate

## 2021-10-09 ENCOUNTER — Encounter: Payer: Self-pay | Admitting: *Deleted

## 2021-10-09 LAB — URINE CULTURE: Culture: <10000 — AB

## 2021-10-09 MED ORDER — FLUCONAZOLE 100 MG PO TABS: ORAL_TABLET | ORAL | 0 refills | Status: AC

## 2021-10-09 NOTE — Progress Notes (Signed)
Called patient to give her the results of her UA. Reviewed the medications that were sent in and how to take them.  Instructed her to keep a log of all her blood sugars and bring the log and the meter to her next appt. On 7/24 with Lauren.   Patient verbalized understanding and readback information given.  Will also mail another AVS with some additional information written about her upcoming appointments and information about the new medications.

## 2021-10-10 ENCOUNTER — Inpatient Hospital Stay: Payer: Medicaid Other | Admitting: Licensed Clinical Social Worker

## 2021-10-10 DIAGNOSIS — Z170 Estrogen receptor positive status [ER+]: Secondary | ICD-10-CM

## 2021-10-10 NOTE — Progress Notes (Signed)
[facility]  Initial Assessment   Sharon Woods is a 61 y.o. year old female contacted by phone. Clinical Social Work was referred by medical provider for assessment of psychosocial needs.   SDOH (Social Determinants of Health) assessments performed: Yes SDOH Interventions    Flowsheet Row Most Recent Value  SDOH Interventions   Food Insecurity Interventions Intervention Not Indicated  Financial Strain Interventions Development worker, community, Other (Comment)  [Referral to fund grant [place] Interventions Intervention Not Indicated  Physical Activity Interventions Intervention Not Indicated  Stress Interventions Intervention Not Indicated  Depression Interventions/Treatment  Counseling       SDOH Screenings   Alcohol Screen: Not on file  Depression (PHQ2-9): Low Risk  (10/10/2021)   Depression (PHQ2-9)    PHQ-2 Score: 0  Financial Resource Strain: Medium Risk (10/10/2021)   Overall Financial Resource Strain (CARDIA)    Difficulty of Paying Living Expenses: Somewhat hard  Food Insecurity: No Food Insecurity (10/10/2021)   Hunger Vital Sign    Worried About Running Out of Food in the Last Year: Never true    Ran Out of Food in the Last Year: Never true  Housing: High Risk (10/10/2021)   Housing    Last Housing Risk Score: 2  Physical Activity: Inactive (10/10/2021)   Exercise Vital Sign    Days of Exercise per Week: 0 days    Minutes of Exercise per Session: 0 min  Social Connections: Socially Isolated (10/10/2021)   Social Connection and Isolation Panel [NHANES]    Frequency of Communication with Friends and Family: Once a week    Frequency of Social Gatherings with Friends and Family: Never    Attends Religious Services: Never    Marine scientist or Organizations: No    Attends Archivist Meetings: Never    Marital Status: Married  Stress: Stress Concern Present (10/10/2021)   [employer] [place] of Stress : To some extent  Tobacco Use: Medium Risk (10/08/2021)   Patient History    Smoking Tobacco Use: Former    Smokeless Tobacco Use: Never    Passive Exposure: Not on file  Transportation Needs: Unmet Transportation Needs (09/16/2021)   PRAPARE - Hydrologist (Medical): Yes    Lack of Transportation (Non-Medical): Yes     Distress Screen completed: No     No data to display            Family/Social Information:  Housing Arrangement: patient lives with spouse  Sharon, Woods "Sharon Woods (Spouse)  [phone]  Family members/support persons in your life? Family Transportation concerns: no  Employment: Unemployed , caregiver stated they are applying for Medicare.  Income source: Supported by Family and Friends and No income Financial concerns: Yes, current concerns Type of concern: Medical bills and overall concerns about financial status. Food access concerns: no Religious or spiritual practice: Not known Services Currently in place:  Medicaid Pending  Coping/ Adjustment to diagnosis: Patient understands treatment plan and what happens next? yes Concerns about diagnosis and/or treatment: Overwhelmed by information, Afraid of cancer, and How I will pay for the services I need Patient reported stressors: Finances, Anxiety/ nervousness, Adjusting to my illness, and Physical issues Hopes and/or priorities: N/A Patient enjoys time with family/ friends Current coping skills/ strengths: Capable of independent living  and Supportive family/friends     SUMMARY: Current SDOH Barriers:  Financial constraints related to no current source of  income, Limited social support, Medication procurement, and Mental Health Concerns   Clinical Social Work Clinical Goal(s):  Patient will work with Hotel manager, Tax inspector and   to address needs related to financial concerns  Interventions: Discussed common feeling and  emotions when being diagnosed with cancer, and the importance of support during treatment Informed patient of the support team roles and support services at [facility] Provided [place] contact information and encouraged patient to call with any questions or concerns Referred patient to Designer, jewellery, financial navigator, Provided patient with information about CSW role in patient care, other available resources , and Provided education regarding the difference between disability and Medicare.  CSW spoke primarily to patient's spouse Sharon Woods, who stated they have already applied for medicaid and the patient is pending approval Sharon Woods so stated the they are going to apply for Medicare.    Follow Up Plan: Patient will contact CSW with any support or resource needs Patient verbalizes understanding of plan: Yes    Sharon Hellenbrand, LCSW

## 2021-10-21 ENCOUNTER — Inpatient Hospital Stay (HOSPITAL_BASED_OUTPATIENT_CLINIC_OR_DEPARTMENT_OTHER): Payer: Medicaid Other | Admitting: Nurse Practitioner

## 2021-10-21 ENCOUNTER — Encounter: Payer: Self-pay | Admitting: Nurse Practitioner

## 2021-10-21 ENCOUNTER — Inpatient Hospital Stay: Payer: Medicaid Other

## 2021-10-21 VITALS — BP 132/87 | HR 79 | Temp 98.2°F | Wt 181.0 lb

## 2021-10-21 DIAGNOSIS — Z170 Estrogen receptor positive status [ER+]: Secondary | ICD-10-CM | POA: Diagnosis not present

## 2021-10-21 DIAGNOSIS — C50511 Malignant neoplasm of lower-outer quadrant of right female breast: Secondary | ICD-10-CM | POA: Diagnosis not present

## 2021-10-21 DIAGNOSIS — Z7984 Long term (current) use of oral hypoglycemic drugs: Secondary | ICD-10-CM | POA: Diagnosis not present

## 2021-10-21 DIAGNOSIS — Z87891 Personal history of nicotine dependence: Secondary | ICD-10-CM | POA: Diagnosis not present

## 2021-10-21 DIAGNOSIS — C50412 Malignant neoplasm of upper-outer quadrant of left female breast: Secondary | ICD-10-CM | POA: Diagnosis not present

## 2021-10-21 DIAGNOSIS — E1169 Type 2 diabetes mellitus with other specified complication: Secondary | ICD-10-CM | POA: Diagnosis not present

## 2021-10-21 DIAGNOSIS — L299 Pruritus, unspecified: Secondary | ICD-10-CM | POA: Diagnosis not present

## 2021-10-21 DIAGNOSIS — E1165 Type 2 diabetes mellitus with hyperglycemia: Secondary | ICD-10-CM | POA: Diagnosis not present

## 2021-10-21 DIAGNOSIS — B3731 Acute candidiasis of vulva and vagina: Secondary | ICD-10-CM | POA: Diagnosis not present

## 2021-10-21 DIAGNOSIS — Z809 Family history of malignant neoplasm, unspecified: Secondary | ICD-10-CM | POA: Diagnosis not present

## 2021-10-21 DIAGNOSIS — Z801 Family history of malignant neoplasm of trachea, bronchus and lung: Secondary | ICD-10-CM | POA: Diagnosis not present

## 2021-10-21 DIAGNOSIS — N390 Urinary tract infection, site not specified: Secondary | ICD-10-CM | POA: Diagnosis not present

## 2021-10-21 LAB — COMPREHENSIVE METABOLIC PANEL
ALT: 24 U/L (ref 0–44)
AST: 26 U/L (ref 15–41)
Albumin: 3.9 g/dL (ref 3.5–5.0)
Alkaline Phosphatase: 62 U/L (ref 38–126)
Anion gap: 4 — ABNORMAL LOW (ref 5–15)
BUN: 15 mg/dL (ref 6–20)
CO2: 24 mmol/L (ref 22–32)
Calcium: 8.8 mg/dL — ABNORMAL LOW (ref 8.9–10.3)
Chloride: 109 mmol/L (ref 98–111)
Creatinine, Ser: 0.6 mg/dL (ref 0.44–1.00)
GFR, Estimated: >60 mL/min (ref >60)
Glucose, Bld: 176 mg/dL — ABNORMAL HIGH (ref 70–99)
Potassium: 3.8 mmol/L (ref 3.5–5.1)
Sodium: 137 mmol/L (ref 135–145)
Total Bilirubin: 0.5 mg/dL (ref 0.3–1.2)
Total Protein: 7.5 g/dL (ref 6.5–8.1)

## 2021-10-21 LAB — CBC WITH DIFFERENTIAL/PLATELET
Abs Immature Granulocytes: 0 10*3/uL (ref 0.00–0.07)
Basophils Absolute: 0 10*3/uL (ref 0.0–0.1)
Basophils Relative: 1 %
Eosinophils Absolute: 0.1 10*3/uL (ref 0.0–0.5)
Eosinophils Relative: 1 %
HCT: 40.4 % (ref 36.0–46.0)
Hemoglobin: 13.2 g/dL (ref 12.0–15.0)
Immature Granulocytes: 0 %
Lymphocytes Relative: 30 %
Lymphs Abs: 1.3 10*3/uL (ref 0.7–4.0)
MCH: 29.7 pg (ref 26.0–34.0)
MCHC: 32.7 g/dL (ref 30.0–36.0)
MCV: 90.8 fL (ref 80.0–100.0)
Monocytes Absolute: 0.3 10*3/uL (ref 0.1–1.0)
Monocytes Relative: 8 %
Neutro Abs: 2.5 10*3/uL (ref 1.7–7.7)
Neutrophils Relative %: 60 %
Platelets: 81 10*3/uL — ABNORMAL LOW (ref 150–400)
RBC: 4.45 MIL/uL (ref 3.87–5.11)
RDW: 13.9 % (ref 11.5–15.5)
WBC: 4.2 10*3/uL (ref 4.0–10.5)
nRBC: 0 % (ref 0.0–0.2)

## 2021-10-21 NOTE — Patient Instructions (Signed)
Great job with your medications and controlling your blood sugars. I'm going to reach out to Dr. Tasia Catchings and your surgeon to update them and see if you can be re-evaluated for surgery. I'll see you in 2 weeks for follow up. If you need anything in the meantime, please call the office at [phone].

## 2021-10-21 NOTE — Progress Notes (Unsigned)
[place] [facility] [place]. [facility] [address] [place], [place] [address] [phone] (phone) [phone] (fax)  Patient Care Team: Patient, No Pcp Per as PCP - General (General Practice) Brannock, Heath Gold, RN Earlie Server, MD as Consulting Physician (Oncology) Daiva Huge, RN as Oncology Nurse Navigator   Name of the patient: Sharon Woods  [id number]  17-Jul-1960   Date of visit: 10/08/21  Diagnosis- Lobular Breast cancer  Chief complaint/ Reason for visit- follow up for diabetes  Heme/Onc history:  Oncology History  Malignant neoplasm of left breast ([place])  08/06/2021 Mammogram   08/06/2021, digital bilateral mammogram and ultrasound showed left breast 3:00 mass, 1.7 x 1.7 x 1.7 cm, ultrasound of the left axillary is negative. 08/21/2021 right breast 1 x 0.7 x 0.8 cm angulated spiculated mass at the right breast 7:00, 5 cm from nipple.  Ultrasound of the right axillary demonstrates 3 abnormal thickened cortex lymph node. 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 Franki Cabot right breast 7:00, invasive mammary carcinoma with lobular features, in situ carcinoma,  ER+, PR+, HER 2-This was found to be concordant by Dr. Franki Cabot. right axilla lymph node +, extracapsular extension.  -This was found to be concordant by Dr. Franki Cabot   08/28/2021 Initial Diagnosis   Malignant neoplasm of upper-outer quadrant of left breast in female, estrogen receptor positive ([place])   08/28/2021 Cancer Staging   Staging form: Breast, AJCC 8th Edition - Clinical stage from 08/28/2021: Stage IA (cT1c, cN0, cM0, G2, ER+, PR+, HER2-) - Signed by Earlie Server, MD on 08/28/2021 Stage prefix: Initial diagnosis Histologic grading system: 3 grade system    08/28/2021 Imaging   CT CHEST, ABDOMEN, AND PELVIS WITH CONTRAST Asymmetric mildly enlarged right axillary nodes. Correlate with biopsy. There are some punctate foci of sclerosis in the included spine that could reflect subtle metastatic disease. Nonobstructing bilateral renal calculi.   08/29/2021 Imaging   BILATERAL BREAST MRI WITH AND WITHOUT CONTRAST 1. 2.5 centimeter enhancing mass in the LOWER OUTER QUADRANT of the RIGHT breast, correlating with known malignancy. 2. At least 4 enlarged RIGHT axillary lymph nodes, correlating well with recently biopsied lymph node showing metastatic disease. 3. 3.4 centimeter enhancing mass in the LEFT breast, with an additional 2.9 centimeters of non mass enhancement posterior to the mass also suspicious for malignancy. This likely correlates with the area calcifications seen mammographically. 4. 5 millimeters satellite nodule along the LATERAL aspect of known malignancy in the LOWER OUTER QUADRANT of the LEFT breast. 5. LEFT axilla is negative for adenopathy.       09/05/2021 Imaging   NUCLEAR MEDICINE WHOLE BODY BONE SCAN Uptake at L2 which is nonspecific but may be related to advanced degenerative disc and facet disease changes at both L1-L2 and L2-L3; no evidence of osseous metastatic disease by CT. No definite osseous metastatic lesions identified.   09/23/2021 Surgery   Surgery    Genetic Testing   Negative genetic testing. No pathogenic variants identified on the [facility] CancerNext-Expanded+RNA panel. The report date is 10/03/2021.  The CancerNext-Expanded + RNAinsight gene panel offered by [employer] and includes sequencing and rearrangement analysis for the following 77 genes: IP, ALK, APC*, ATM*, AXIN2, BAP1, BARD1, BLM, BMPR1A, BRCA1*, BRCA2*, BRIP1*, CDC73, CDH1*,CDK4, CDKN1B, CDKN2A, CHEK2*, CTNNA1, DICER1,  FANCC, FH, FLCN, GALNT12, KIF1B, LZTR1, MAX, MEN1, MET, MLH1*, MSH2*, MSH3, MSH6*, MUTYH*, NBN, NF1*, NF2, NTHL1, PALB2*, PHOX2B, PMS2*,  POT1, PRKAR1A, PTCH1, PTEN*, RAD51C*, RAD51D*,RB1, RECQL, RET, SDHA, SDHAF2, SDHB, SDHC, SDHD, SMAD4, SMARCA4, SMARCB1, SMARCE1, STK11, SUFU, TMEM127, TP53*,TSC1, TSC2, VHL and XRCC2 (sequencing and deletion/duplication); EGFR, EGLN1, HOXB13, KIT, MITF, PDGFRA, POLD1 and POLE (sequencing only); EPCAM and GREM1 (deletion/duplication only).   Malignant neoplasm of lower-outer quadrant of right breast of female, estrogen receptor positive ([place])  08/06/2021 Mammogram   08/06/2021, digital bilateral mammogram and ultrasound showed left breast 3:00 mass, 1.7 x 1.7 x 1.7 cm, ultrasound of the left axillary is negative. 08/21/2021 right breast 1 x 0.7 x 0.8 cm angulated spiculated mass at the right breast 7:00, 5 cm from nipple.  Ultrasound of the right axillary demonstrates 3 abnormal thickened cortex lymph node. 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 Franki Cabot right breast 7:00, invasive mammary carcinoma with lobular features, in situ carcinoma,  ER+, PR+, HER 2-This was found to be concordant by Dr. Franki Cabot. right axilla lymph node +, extracapsular extension.  -This was found to be concordant by Dr. Franki Cabot   08/28/2021 Initial Diagnosis   Malignant neoplasm of lower-outer quadrant of right breast of female, estrogen receptor positive ([place])   08/28/2021 Cancer Staging   Staging form: Breast, AJCC 8th Edition - Clinical stage from 08/28/2021: Stage IIA (cT2, cN1, cM0, G2, ER+, PR+, HER2-) - Signed by Earlie Server, MD on 09/09/2021 Stage prefix: Initial diagnosis Histologic grading system: 3 grade system   08/28/2021 Imaging   CT CHEST, ABDOMEN, AND PELVIS WITH CONTRAST Asymmetric mildly enlarged right axillary nodes. Correlate with biopsy. There are some punctate foci of sclerosis in the included spine that could reflect subtle  metastatic disease. Nonobstructing bilateral renal calculi.   08/29/2021 Imaging   BILATERAL BREAST MRI WITH AND WITHOUT CONTRAST 1. 2.5 centimeter enhancing mass in the LOWER OUTER QUADRANT of the RIGHT breast, correlating with known malignancy. 2. At least 4 enlarged RIGHT axillary lymph nodes, correlating well with recently biopsied lymph node showing metastatic disease. 3. 3.4 centimeter enhancing mass in the LEFT breast, with an additional 2.9 centimeters of non mass enhancement posterior to the mass also suspicious for malignancy. This likely correlates with the area calcifications seen mammographically. 4. 5 millimeters satellite nodule along the LATERAL aspect of known malignancy in the LOWER OUTER QUADRANT of the LEFT breast. 5. LEFT axilla is negative for adenopathy.       09/05/2021 Imaging   NUCLEAR MEDICINE WHOLE BODY BONE SCAN Uptake at L2 which is nonspecific but may be related to advanced degenerative disc and facet disease changes at both L1-L2 and L2-L3; no evidence of osseous metastatic disease by CT. No definite osseous metastatic lesions identified.   09/09/2021 Oncotype testing   [id number] block RIGHT 7:00 5 CM FN; ULTRASOUND-GUIDED BIOPSY Oncotype Dx RS score 22   09/23/2021 Surgery   Surgery    Genetic Testing   Negative genetic testing. No pathogenic variants identified on the [facility] CancerNext-Expanded+RNA panel. The report date is 10/03/2021.  The CancerNext-Expanded + RNAinsight gene panel offered by [employer] and includes sequencing and rearrangement analysis for the following 77 genes: IP, ALK, APC*, ATM*, AXIN2, BAP1, BARD1, BLM, BMPR1A, BRCA1*, BRCA2*, BRIP1*, CDC73, CDH1*,CDK4, CDKN1B, CDKN2A, CHEK2*, CTNNA1, DICER1, FANCC, FH, FLCN, GALNT12, KIF1B,  LZTR1, MAX, MEN1, MET, MLH1*, MSH2*, MSH3, MSH6*, MUTYH*, NBN, NF1*, NF2, NTHL1, PALB2*, PHOX2B, PMS2*, POT1, PRKAR1A, PTCH1, PTEN*, RAD51C*, RAD51D*,RB1, RECQL, RET, SDHA, SDHAF2, SDHB, SDHC, SDHD, SMAD4,  SMARCA4, SMARCB1, SMARCE1, STK11, SUFU, TMEM127, TP53*,TSC1, TSC2, VHL and XRCC2 (sequencing and deletion/duplication); EGFR, EGLN1, HOXB13, KIT, MITF, PDGFRA, POLD1 and POLE (sequencing only); EPCAM and GREM1 (deletion/duplication only).     Interval history- Patient is 61 year old female newly diagnosed with lobular breast cancer currently on neoadjuvant endocrine therapy while she awaits bilateral mastectomy who returns to clinic for follow up for diabetes. She reports compliance with blood sugar monitoring and supplies log and meter for review. She reports compliance with metformin and glipizide. Has ongoing vulvar itching which is somewhat improved. Continues to have itching of skin spots, possible insect, improved with cream. She is anxious regarding possible mastectomy and questions possibility for reconstruction. Is concerned for cosmetic effect.   ECOG FS:1 - Symptomatic but completely ambulatory  Review of systems- Review of Systems  Constitutional:  Positive for malaise/fatigue. Negative for chills, fever and weight loss.  HENT:  Negative for hearing loss, nosebleeds, sore throat and tinnitus.   Eyes:  Negative for blurred vision and double vision.  Respiratory:  Negative for cough, hemoptysis, shortness of breath and wheezing.   Cardiovascular:  Negative for chest pain, palpitations and leg swelling.  Gastrointestinal:  Negative for abdominal pain, blood in stool, constipation, diarrhea, melena, nausea and vomiting.  Genitourinary:  Negative for dysuria and urgency.       Per hpi  Musculoskeletal:  Negative for back pain, falls, joint pain and myalgias.  Skin:  Positive for itching and rash.  Neurological:  Negative for dizziness, tingling, sensory change, loss of consciousness, weakness and headaches.  Endo/Heme/Allergies:  Negative for environmental allergies and polydipsia. Does not bruise/bleed easily.  Psychiatric/Behavioral:  Negative for depression. The patient is not  nervous/anxious and does not have insomnia.     Allergies  Allergen Reactions  . Tylenol [Acetaminophen]     Per pt due to cirrhosis of the liver  . Aleve [Naproxen] Rash    Patient Active Problem List   Diagnosis Date Noted  . Diabetes mellitus ([place]) 10/08/2021  . Genetic testing 10/07/2021  . Diabetes ([place]) 09/28/2021  . Vaginitis 09/28/2021  . Thrombocytopenia ([place]) 09/08/2021  . Malignant neoplasm of left breast ([place]) 08/28/2021  . Malignant neoplasm of lower-outer quadrant of right breast of female, estrogen receptor positive ([place]) 08/28/2021  . Goals of care, counseling/discussion 08/28/2021  . Severe recurrent major depression with psychotic features ([place]) 11/15/2015  . Hypertension 11/15/2015  . Noncompliance 11/15/2015  . Severe major depression, single episode, with psychotic features, mood-congruent ([place]) 03/29/2015  . Protein-calorie malnutrition, severe 03/26/2015  . 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 Anxiety   . History of high cholesterol 2000  . Hypertension   . Patient denies medical problems    Past Surgical History:  Procedure Laterality Date  . ABDOMINAL HYSTERECTOMY    . BREAST BIOPSY Right 08/21/2021   [place] bx 7:00 mass coil clip path pending  . BREAST BIOPSY Right 08/21/2021   [place] bx of LN, hydro marker, path pending  . BREAST BIOPSY Left 08/21/2021   [place] bx, heart marker, path pending  . CESAREAN SECTION     x2  . NO PAST SURGERIES     Social History   Socioeconomic History  . Marital status: Married    Spouse name: Not on file  . Number  of children: Not on file  . Years of education: Not on file  . Highest education level: Not on file  Occupational History  . Not on file  Tobacco Use  . Smoking status: Former    Types: Cigarettes    Quit date: 2013    Years since quitting: 10.5  . Smokeless tobacco: Never  Vaping Use  . Vaping Use: Never used  Substance and Sexual Activity  . Alcohol use: Yes     Comment: occasionally  . Drug use: No  . Sexual activity: Not Currently    Comment: unable to assess   Other Topics Concern  . Not on file  Social History Narrative  . Not on file   Social Determinants of Health   Financial Resource Strain: Medium Risk (10/10/2021)   Overall Financial Resource Strain (CARDIA)   . Difficulty of Paying Living Expenses: Somewhat hard  Food Insecurity: No Food Insecurity (10/10/2021)   Hunger Vital Sign   . Worried About Charity fundraiser in the Last Year: Never true   . Ran Out of Food in the Last Year: Never true  Transportation Needs: Unmet Transportation Needs (09/16/2021)   PRAPARE - Transportation   . Lack of Transportation (Medical): Yes   . Lack of Transportation (Non-Medical): Yes  Physical Activity: Inactive (10/10/2021)   Exercise Vital Sign   . Days of Exercise per Week: 0 days   . Minutes of Exercise per Session: 0 min  Stress: Stress Concern Present (10/10/2021)   [place]   . Feeling of Stress : To some extent  Social Connections: Socially Isolated (10/10/2021)   Social Connection and Isolation Panel [NHANES]   . Frequency of Communication with Friends and Family: Once a week   . Frequency of Social Gatherings with Friends and Family: Never   . Attends Religious Services: Never   . Active Member of Clubs or Organizations: No   . Attends Archivist Meetings: Never   . Marital Status: Married  Human resources officer Violence: Not on file   Family History  Problem Relation Age of Onset  . Lung cancer Mother   . Dementia Mother   . Parkinson's disease Father   . Cancer Father        unk type  . Diabetes Sister   . Heart attack Sister   . Breast cancer Sister   . Cancer Maternal Uncle        unk types  . Dementia Maternal Grandmother   . Cancer Maternal Grandmother        unk type  . Cancer Other   . Dementia Other     Current Outpatient Medications:   .  ACCU-CHEK GUIDE test strip, USE TO CHECK BLOOD SUGAR UP TO 4 TIMES DAILY AS DIRECTED, Disp: , Rfl:  .  Accu-Chek Softclix Lancets lancets, SMARTSIG:Topical 1-4 Times Daily, Disp: , Rfl:  .  acetaminophen (TYLENOL) 500 MG tablet, Take 500 mg by mouth every 6 (six) hours as needed., Disp: , Rfl:  .  blood glucose meter kit and supplies KIT, Dispense based on patient and insurance preference. Use up to four times daily as directed., Disp: 1 each, Rfl: 1 .  calcium carbonate (TUMS - DOSED IN MG ELEMENTAL CALCIUM) 500 MG chewable tablet, Chew 1 tablet by mouth daily., Disp: , Rfl:  .  glipiZIDE (GLUCOTROL) 5 MG tablet, Take 1 tablet (5 mg total) by mouth daily before breakfast., Disp: 30 tablet, Rfl: 1 .  letrozole (FEMARA) 2.5 MG tablet, Take 1 tablet (2.5 mg total) by mouth daily., Disp: 30 tablet, Rfl: 0 .  metFORMIN (GLUCOPHAGE) 500 MG tablet, Take 1 tablet (500 mg total) by mouth daily., Disp: 30 tablet, Rfl: 0 .  triamcinolone cream (KENALOG) 0.1 %, Apply 1 Application topically 2 (two) times daily., Disp: 80 g, Rfl: 0  Physical exam:  Vitals:   10/21/21 1038  BP: 132/87  Pulse: 79  Temp: 98.2 F (36.8 C)  TempSrc: Tympanic  SpO2: 98%  Weight: 181 lb (82.1 kg)   Physical Exam Constitutional:      General: She is not in acute distress.    Appearance: She is well-developed. She is not ill-appearing.  HENT:     Head: Atraumatic.     Nose: Nose normal.     Mouth/Throat:     Pharynx: No oropharyngeal exudate.  Eyes:     General: No scleral icterus.    Conjunctiva/sclera: Conjunctivae normal.  Cardiovascular:     Rate and Rhythm: Normal rate and regular rhythm.  Pulmonary:     Effort: Pulmonary effort is normal.     Breath sounds: Normal breath sounds.  Abdominal:     General: There is no distension.     Palpations: Abdomen is soft.     Tenderness: There is no abdominal tenderness.  Musculoskeletal:        General: No tenderness or deformity.     Cervical back: Normal  range of motion and neck supple.  Skin:    General: Skin is warm and dry.     Coloration: Skin is not pale.     Findings: Rash present.  Neurological:     General: No focal deficit present.     Mental Status: She is alert and oriented to person, place, and time.  Psychiatric:        Mood and Affect: Affect is flat.        Behavior: Behavior normal.       Latest Ref Rng & Units 10/21/2021   10:25 AM  CMP  Glucose 70 - 99 mg/dL 176   BUN 6 - 20 mg/dL 15   Creatinine 0.44 - 1.00 mg/dL 0.60   Sodium 135 - 145 mmol/L 137   Potassium 3.5 - 5.1 mmol/L 3.8   Chloride 98 - 111 mmol/L 109   CO2 22 - 32 mmol/L 24   Calcium 8.9 - 10.3 mg/dL 8.8   Total Protein 6.5 - 8.1 g/dL 7.5   Total Bilirubin 0.3 - 1.2 mg/dL 0.5   Alkaline Phos 38 - 126 U/L 62   AST 15 - 41 U/L 26   ALT 0 - 44 U/L 24       Latest Ref Rng & Units 10/21/2021   10:25 AM  CBC  WBC 4.0 - 10.5 K/uL 4.2   Hemoglobin 12.0 - 15.0 g/dL 13.2   Hematocrit 36.0 - 46.0 % 40.4   Platelets 150 - 400 K/uL 81    Urinalysis    Component Value Date/Time   COLORURINE AMBER (A) 10/08/2021 1412   APPEARANCEUR HAZY (A) 10/08/2021 1412   APPEARANCEUR Hazy 10/07/2013 1844   LABSPEC 1.021 10/08/2021 1412   LABSPEC 1.025 10/07/2013 1844   PHURINE 5.0 10/08/2021 1412   GLUCOSEU NEGATIVE 10/08/2021 1412   GLUCOSEU Negative 10/07/2013 1844   HGBUR LARGE (A) 10/08/2021 1412   BILIRUBINUR NEGATIVE 10/08/2021 1412   BILIRUBINUR Negative 10/07/2013 1844   KETONESUR 5 (A) 10/08/2021 1412   PROTEINUR 30 (A) 10/08/2021  [place] 10/08/2021 1412   LEUKOCYTESUR TRACE (A) 10/08/2021 1412   LEUKOCYTESUR Trace 10/07/2013 1844   No images are attached to the encounter.  No results found.  Assessment and plan- Patient is a 61 y.o. female diagnosed with lobular breast cancer who presents to clinic for follow up.   Lobular breast cancer- recommendation was for bilateral mastectomy. On hold d/t surgical risk increased with hA1c  > 10.2% (see below). Currently on neo-adjuvant endocrine therapy. Tolerating well. Blood sugars improved. I've reached out to Shon Millet, [place] who will coordinate appt for patient for re-eavluation.  Candidiasis- urinary and vulvar. likely related to elevated blood sugars. May not be an ideal candidate for SGLT2 inhibitors as it sounds like she's struggled with yeast for some time. Yeast on UA and on pelvic exam. Now s/p diflucan 200 mg daily x 1 day then 100 mg x 6 days. Improved. Continue maintenance measures.  UTI- likely related to hyperglycemia. UA reviewed. Culture did now have significant growth. She is s/p macrobid 500 mg x 5 days. Improved. Flank pain has resolved. Diabetes Mellitus- type 2. HgA1c > 10. We reviewed in detail how elevated blood sugars occur, impact of nutrition on blood sugar. Reviewed how to check blood sugars, frequency, etc. Poor candidate for insulin. She has started metformin and appears to have improved control of sugars. Last reading 163. Did not bring meter to verify. Recommended checking sugar at least once a day. If consistently getting readings > 200, notify clinic. If readings < 70, notify clinic. Reviewed symptoms of hypoglycemia and how to manage. Will start second medication. Given her financial concerns, risk of poor compliance, and urinary/vulvar symptoms, I elected to start her on a sulfonylurea/glipizide. Start with conservative dose of 5 mg once daily after breakfast. Cautioned against hypoglycemia. Consider starting ace-I at next visit for nephro-protective benefits. Nurse Navigator, [place], will reinforce teaching with patient by phone given her limited insight into her disease process and multiple comorbid conditions. She will also meet with dietician today.  Financial insecurity/Underinsured- patient has medicaid pending. She may also quality for some form of disability. May also need assistance navigating resources available to her given her history of psychiatric  illness and medical illiteracy.  No PCP- Patient denies having established care with a Primary Care Provider. Will assist her with getting appointment with PCP to establish care. We also discussed local free and sliding scale clinics which provide affordable care, and often have onsite pharmacies, such as: [facility] ([address], [place], [place] [address]- [phone]), [facility] ([address], [place], [place] [address]- [phone]), [facility] ([address] [place], [place] [address]- [phone]), [place] ([place] [phone]), and [place] and [phone]).   Disposition: 2 weeks- lab (cbc, cmp), and see me- la    Visit Diagnosis 1. Malignant neoplasm of upper-outer quadrant of left breast in female, estrogen receptor positive ([place])    Patient expressed understanding and was in agreement with this plan. She also understands that She can call clinic at any time with any questions, concerns, or complaints.   Thank you for allowing me to participate in the care of this very pleasant patient.   Beckey Rutter, DNP, AGNP-C [facility] [place] [phone]  CC: Dr. Tasia Catchings, Dr. Christian Mate

## 2021-10-21 NOTE — Progress Notes (Unsigned)
Tums not helping, taking 3 at a time. Needs recommendation for something else.  C/o pain b/l outer breast and mid chest.  C/o sinus/back of head pressure and pain.

## 2021-10-22 ENCOUNTER — Telehealth: Payer: Self-pay

## 2021-10-22 ENCOUNTER — Inpatient Hospital Stay: Payer: Medicaid Other

## 2021-10-22 NOTE — Progress Notes (Addendum)
Nutrition Assessment   Reason for Assessment:   Education on DM   ASSESSMENT:  61 year old female with lobular breast cancer.  S/p DM, depression, HTN.  Currently on endocrine therapy as surgery has been delayed due to elevated a1c.    Met with patient and son, [name] in clinic.  Patient says that her appetite is ok.  Normally has oatmeal for breakfast or pop tart or eggs and breakfast meat.  Lunch is whatever (?sandwich, beef stew).  Supper is meat and vegetables.  Says that she is drinking mostly water and coke zero or other diet drinks.     Medications: metformin, glipizde, tums   Labs: glucose 176, calcium 8.8   Anthropometrics:   Height: 61 inches Weight: 181 lb 7/24 184 lb on 5/31 BMI: 34  1% weight loss in the last 2 months, not significant   NUTRITION DIAGNOSIS: Food and nutrition related knowledge deficit related to new DM diagnosis as evidenced by elevated a1c and referral to RD    INTERVENTION:  Stressed importance of taking DM medication as prescribed and checking blood glucose and keeping log.   Discussed importance of consuming sugar free beverages.  Reviewed beverages that do not contain sugar Educated patient on reading food labels.  Handout provided Educated patient on Plate Method for Diabetes.  Handout provided.  Expect fair compliance.  Patient overwhelmed with information.   Asked patient to focus on sugar free beverages and avoiding concentrated sweets. RD wrote examples of these foods to avoid on handout (ie cakes, cookies, candies, pies, ice cream, etc) Contact information provided   MONITORING, EVALUATION, GOAL: weight trends, intake, blood glucose   Next Visit: Wednesday, Aug 16 phone call  Sharon Woods, [place], [place] Registered Dietitian [phone]

## 2021-10-22 NOTE — Telephone Encounter (Signed)
Left message to call office regarding scheduled visit with Dr.Rodenberg.Per PCP A1C is better.  Scheduled appointment w/ Dr.Rodenberg 11/07/21.

## 2021-10-24 ENCOUNTER — Telehealth: Payer: Self-pay

## 2021-10-24 NOTE — Telephone Encounter (Signed)
Patient confirmed 11/07/21 @ 9;5 am appointment w/ Dr.Rodenberg.

## 2021-10-28 ENCOUNTER — Other Ambulatory Visit: Payer: Self-pay | Admitting: Nurse Practitioner

## 2021-10-28 ENCOUNTER — Other Ambulatory Visit: Payer: Self-pay | Admitting: Oncology

## 2021-11-04 ENCOUNTER — Inpatient Hospital Stay (HOSPITAL_BASED_OUTPATIENT_CLINIC_OR_DEPARTMENT_OTHER): Payer: Medicaid Other | Admitting: Nurse Practitioner

## 2021-11-04 ENCOUNTER — Inpatient Hospital Stay: Payer: Medicaid Other | Attending: Oncology

## 2021-11-04 VITALS — BP 137/90 | HR 64 | Temp 98.0°F | Resp 16 | Wt 175.0 lb

## 2021-11-04 DIAGNOSIS — C50412 Malignant neoplasm of upper-outer quadrant of left female breast: Secondary | ICD-10-CM | POA: Diagnosis not present

## 2021-11-04 DIAGNOSIS — Z9013 Acquired absence of bilateral breasts and nipples: Secondary | ICD-10-CM | POA: Insufficient documentation

## 2021-11-04 DIAGNOSIS — Z79811 Long term (current) use of aromatase inhibitors: Secondary | ICD-10-CM | POA: Insufficient documentation

## 2021-11-04 DIAGNOSIS — Z170 Estrogen receptor positive status [ER+]: Secondary | ICD-10-CM

## 2021-11-04 DIAGNOSIS — Z87891 Personal history of nicotine dependence: Secondary | ICD-10-CM | POA: Diagnosis not present

## 2021-11-04 DIAGNOSIS — E1169 Type 2 diabetes mellitus with other specified complication: Secondary | ICD-10-CM

## 2021-11-04 DIAGNOSIS — C50511 Malignant neoplasm of lower-outer quadrant of right female breast: Secondary | ICD-10-CM | POA: Insufficient documentation

## 2021-11-04 LAB — HEMOGLOBIN A1C
Hgb A1c MFr Bld: 7.8 % — ABNORMAL HIGH (ref 4.8–5.6)
Mean Plasma Glucose: 177.16 mg/dL

## 2021-11-04 LAB — BASIC METABOLIC PANEL
Anion gap: 8 (ref 5–15)
BUN: 14 mg/dL (ref 6–20)
CO2: 26 mmol/L (ref 22–32)
Calcium: 9.3 mg/dL (ref 8.9–10.3)
Chloride: 106 mmol/L (ref 98–111)
Creatinine, Ser: 0.7 mg/dL (ref 0.44–1.00)
GFR, Estimated: >60 mL/min (ref >60)
Glucose, Bld: 158 mg/dL — ABNORMAL HIGH (ref 70–99)
Potassium: 3.6 mmol/L (ref 3.5–5.1)
Sodium: 140 mmol/L (ref 135–145)

## 2021-11-04 MED ORDER — METFORMIN HCL 500 MG PO TABS
500.0000 mg | ORAL_TABLET | Freq: Every day | ORAL | 1 refills | Status: DC
Start: 2021-11-04 — End: 2022-01-20

## 2021-11-04 NOTE — Progress Notes (Signed)
[place] [facility] [place]. [facility] [address] [place], [place] [address] [phone] (phone) [phone] (fax)  Patient Care Team: Patient, No Pcp Per as PCP - General (General Practice) Brannock, Heath Gold, RN Earlie Server, MD as Consulting Physician (Oncology) Daiva Huge, RN as Oncology Nurse Navigator   Name of the patient: Sharon Woods  [id number]  [birth year]   Date of visit: 10/08/21  Diagnosis- Lobular Breast cancer  Chief complaint/ Reason for visit- follow up for diabetes  Heme/Onc history:  Oncology History  Malignant neoplasm of left breast ([place])  08/06/2021 Mammogram   08/06/2021, digital bilateral mammogram and ultrasound showed left breast 3:00 mass, 1.7 x 1.7 x 1.7 cm, ultrasound of the left axillary is negative. 08/21/2021 right breast 1 x 0.7 x 0.8 cm angulated spiculated mass at the right breast 7:00, 5 cm from nipple.  Ultrasound of the right axillary demonstrates 3 abnormal thickened cortex lymph node. 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 Franki Cabot right breast 7:00, invasive mammary carcinoma with lobular features, in situ carcinoma,  ER+, PR+, HER 2-This was found to be concordant by Dr. Franki Cabot. right axilla lymph node +, extracapsular extension.  -This was found to be concordant by Dr. Franki Cabot   08/28/2021 Initial Diagnosis   Malignant neoplasm of upper-outer quadrant of left breast in female, estrogen receptor positive ([place])   08/28/2021 Cancer Staging   Staging form: Breast, AJCC 8th Edition - Clinical stage from 08/28/2021: Stage IA (cT1c, cN0, cM0, G2, ER+, PR+, HER2-) - Signed by Earlie Server, MD on 08/28/2021 Stage prefix: Initial diagnosis Histologic grading system: 3 grade system    08/28/2021 Imaging   CT CHEST, ABDOMEN, AND PELVIS WITH CONTRAST Asymmetric mildly enlarged right axillary nodes. Correlate with biopsy. There are some punctate foci of sclerosis in the included spine that could reflect subtle metastatic disease. Nonobstructing bilateral renal calculi.   08/29/2021 Imaging   BILATERAL BREAST MRI WITH AND WITHOUT CONTRAST 1. 2.5 centimeter enhancing mass in the LOWER OUTER QUADRANT of the RIGHT breast, correlating with known malignancy. 2. At least 4 enlarged RIGHT axillary lymph nodes, correlating well with recently biopsied lymph node showing metastatic disease. 3. 3.4 centimeter enhancing mass in the LEFT breast, with an additional 2.9 centimeters of non mass enhancement posterior to the mass also suspicious for malignancy. This likely correlates with the area calcifications seen mammographically. 4. 5 millimeters satellite nodule along the LATERAL aspect of known malignancy in the LOWER OUTER QUADRANT of the LEFT breast. 5. LEFT axilla is negative for adenopathy.       09/05/2021 Imaging   NUCLEAR MEDICINE WHOLE BODY BONE SCAN Uptake at L2 which is nonspecific but may be related to advanced degenerative disc and facet disease changes at both L1-L2 and L2-L3; no evidence of osseous metastatic disease by CT. No definite osseous metastatic lesions identified.   09/23/2021 Surgery   Surgery    Genetic Testing   Negative genetic testing. No pathogenic variants identified on the [facility] CancerNext-Expanded+RNA panel. The report date is 10/03/2021.  The CancerNext-Expanded + RNAinsight gene panel offered by [employer] and includes sequencing and rearrangement analysis for the following 77 genes: IP, ALK, APC*, ATM*, AXIN2, BAP1, BARD1, BLM, BMPR1A, BRCA1*, BRCA2*, BRIP1*, CDC73, CDH1*,CDK4, CDKN1B, CDKN2A, CHEK2*, CTNNA1, DICER1,  FANCC, FH, FLCN, GALNT12, KIF1B, LZTR1, MAX, MEN1, MET, MLH1*, MSH2*, MSH3, MSH6*, MUTYH*, NBN, NF1*, NF2, NTHL1, PALB2*, PHOX2B, PMS2*,  POT1, PRKAR1A, PTCH1, PTEN*, RAD51C*, RAD51D*,RB1, RECQL, RET, SDHA, SDHAF2, SDHB, SDHC, SDHD, SMAD4, SMARCA4, SMARCB1, SMARCE1, STK11, SUFU, TMEM127, TP53*,TSC1, TSC2, VHL and XRCC2 (sequencing and deletion/duplication); EGFR, EGLN1, HOXB13, KIT, MITF, PDGFRA, POLD1 and POLE (sequencing only); EPCAM and GREM1 (deletion/duplication only).   Malignant neoplasm of lower-outer quadrant of right breast of female, estrogen receptor positive ([place])  08/06/2021 Mammogram   08/06/2021, digital bilateral mammogram and ultrasound showed left breast 3:00 mass, 1.7 x 1.7 x 1.7 cm, ultrasound of the left axillary is negative. 08/21/2021 right breast 1 x 0.7 x 0.8 cm angulated spiculated mass at the right breast 7:00, 5 cm from nipple.  Ultrasound of the right axillary demonstrates 3 abnormal thickened cortex lymph node. 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 Franki Cabot right breast 7:00, invasive mammary carcinoma with lobular features, in situ carcinoma,  ER+, PR+, HER 2-This was found to be concordant by Dr. Franki Cabot. right axilla lymph node +, extracapsular extension.  -This was found to be concordant by Dr. Franki Cabot   08/28/2021 Initial Diagnosis   Malignant neoplasm of lower-outer quadrant of right breast of female, estrogen receptor positive ([place])   08/28/2021 Cancer Staging   Staging form: Breast, AJCC 8th Edition - Clinical stage from 08/28/2021: Stage IIA (cT2, cN1, cM0, G2, ER+, PR+, HER2-) - Signed by Earlie Server, MD on 09/09/2021 Stage prefix: Initial diagnosis Histologic grading system: 3 grade system   08/28/2021 Imaging   CT CHEST, ABDOMEN, AND PELVIS WITH CONTRAST Asymmetric mildly enlarged right axillary nodes. Correlate with biopsy. There are some punctate foci of sclerosis in the included spine that could reflect subtle  metastatic disease. Nonobstructing bilateral renal calculi.   08/29/2021 Imaging   BILATERAL BREAST MRI WITH AND WITHOUT CONTRAST 1. 2.5 centimeter enhancing mass in the LOWER OUTER QUADRANT of the RIGHT breast, correlating with known malignancy. 2. At least 4 enlarged RIGHT axillary lymph nodes, correlating well with recently biopsied lymph node showing metastatic disease. 3. 3.4 centimeter enhancing mass in the LEFT breast, with an additional 2.9 centimeters of non mass enhancement posterior to the mass also suspicious for malignancy. This likely correlates with the area calcifications seen mammographically. 4. 5 millimeters satellite nodule along the LATERAL aspect of known malignancy in the LOWER OUTER QUADRANT of the LEFT breast. 5. LEFT axilla is negative for adenopathy.       09/05/2021 Imaging   NUCLEAR MEDICINE WHOLE BODY BONE SCAN Uptake at L2 which is nonspecific but may be related to advanced degenerative disc and facet disease changes at both L1-L2 and L2-L3; no evidence of osseous metastatic disease by CT. No definite osseous metastatic lesions identified.   09/09/2021 Oncotype testing   [id number] block RIGHT 7:00 5 CM FN; ULTRASOUND-GUIDED BIOPSY Oncotype Dx RS score 22   09/23/2021 Surgery   Surgery    Genetic Testing   Negative genetic testing. No pathogenic variants identified on the [facility] CancerNext-Expanded+RNA panel. The report date is 10/03/2021.  The CancerNext-Expanded + RNAinsight gene panel offered by [employer] and includes sequencing and rearrangement analysis for the following 77 genes: IP, ALK, APC*, ATM*, AXIN2, BAP1, BARD1, BLM, BMPR1A, BRCA1*, BRCA2*, BRIP1*, CDC73, CDH1*,CDK4, CDKN1B, CDKN2A, CHEK2*, CTNNA1, DICER1, FANCC, FH, FLCN, GALNT12, KIF1B,  LZTR1, MAX, MEN1, MET, MLH1*, MSH2*, MSH3, MSH6*, MUTYH*, NBN, NF1*, NF2, NTHL1, PALB2*, PHOX2B, PMS2*, POT1, PRKAR1A, PTCH1, PTEN*, RAD51C*, RAD51D*,RB1, RECQL, RET, SDHA, SDHAF2, SDHB, SDHC, SDHD, SMAD4,  SMARCA4, SMARCB1, SMARCE1, STK11, SUFU, TMEM127, TP53*,TSC1, TSC2, VHL and XRCC2 (sequencing and deletion/duplication); EGFR, EGLN1, HOXB13, KIT, MITF, PDGFRA, POLD1 and POLE (sequencing only); EPCAM and GREM1 (deletion/duplication only).     Interval history- Patient is 61 year old female newly diagnosed with lobular breast cancer currently on neoadjuvant endocrine therapy while she awaits bilateral mastectomy who returns to clinic for follow up for diabetes. She reports compliance with blood sugar monitoring and supplies log and meter for review. She has run out of metformin and requests refill. Reports compliance with glipizide and letrozole. Has upcoming appointment with surgery. Continues to be anxious regarding possible mastectomy.   ECOG FS:1 - Symptomatic but completely ambulatory  Review of systems- Review of Systems  Constitutional:  Positive for malaise/fatigue. Negative for chills, fever and weight loss.  HENT:  Negative for hearing loss, nosebleeds, sore throat and tinnitus.   Eyes:  Negative for blurred vision and double vision.  Respiratory:  Negative for cough, hemoptysis, shortness of breath and wheezing.   Cardiovascular:  Negative for chest pain, palpitations and leg swelling.  Gastrointestinal:  Negative for abdominal pain, blood in stool, constipation, diarrhea, melena, nausea and vomiting.  Genitourinary:  Negative for dysuria and urgency.       Per hpi  Musculoskeletal:  Negative for back pain, falls, joint pain and myalgias.  Skin:  Positive for itching and rash.  Neurological:  Negative for dizziness, tingling, sensory change, loss of consciousness, weakness and headaches.  Endo/Heme/Allergies:  Negative for environmental allergies and polydipsia. Does not bruise/bleed easily.  Psychiatric/Behavioral:  Negative for depression. The patient is not nervous/anxious and does not have insomnia.     Allergies  Allergen Reactions   Tylenol [Acetaminophen]     Per pt due to  cirrhosis of the liver   Aleve [Naproxen] Rash    Patient Active Problem List   Diagnosis Date Noted   Diabetes mellitus ([place]) 10/08/2021   Genetic testing 10/07/2021   Diabetes ([place]) 09/28/2021   Vaginitis 09/28/2021   Thrombocytopenia ([place]) 09/08/2021   Malignant neoplasm of left breast ([place]) 08/28/2021   Malignant neoplasm of lower-outer quadrant of right breast of female, estrogen receptor positive ([place]) 08/28/2021   Goals of care, counseling/discussion 08/28/2021   Severe recurrent major depression with psychotic features ([place]) 11/15/2015   Hypertension 11/15/2015   Noncompliance 11/15/2015   Severe major depression, single episode, with psychotic features, mood-congruent ([place]) 03/29/2015   Protein-calorie malnutrition, severe 03/26/2015   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   Past Medical History:  Diagnosis Date   Anxiety    History of high cholesterol 2000   Hypertension    Patient denies medical problems    Past Surgical History:  Procedure Laterality Date   ABDOMINAL HYSTERECTOMY     BREAST BIOPSY Right 08/21/2021   [place] bx 7:00 mass coil clip path pending   BREAST BIOPSY Right 08/21/2021   [place] bx of LN, hydro marker, path pending   BREAST BIOPSY Left 08/21/2021   [place] bx, heart marker, path pending   CESAREAN SECTION     x2   NO PAST SURGERIES     Social History   Socioeconomic History   Marital status: Married    Spouse name: Not on file   Number of children: Not on file   Years of education: Not on file   Highest  education level: Not on file  Occupational History   Not on file  Tobacco Use   Smoking status: Former    Types: Cigarettes    Quit date: 2013    Years since quitting: 10.6   Smokeless tobacco: Never  Vaping Use   Vaping Use: Never used  Substance and Sexual Activity   Alcohol use: Yes    Comment: occasionally   Drug use: No   Sexual activity: Not Currently    Comment: unable to assess   Other Topics Concern   Not on file  Social History Narrative    Not on file   Social Determinants of Health   Financial Resource Strain: Medium Risk (10/10/2021)   Overall Financial Resource Strain (CARDIA)    Difficulty of Paying Living Expenses: Somewhat hard  Food Insecurity: No Food Insecurity (10/10/2021)   Hunger Vital Sign    Worried About Running Out of Food in the Last Year: Never true    Ran Out of Food in the Last Year: Never true  Transportation Needs: Unmet Transportation Needs (09/16/2021)   PRAPARE - Transportation    Lack of Transportation (Medical): Yes    Lack of Transportation (Non-Medical): Yes  Physical Activity: Inactive (10/10/2021)   Exercise Vital Sign    Days of Exercise per Week: 0 days    Minutes of Exercise per Session: 0 min  Stress: Stress Concern Present (10/10/2021)   [place]    Feeling of Stress : To some extent  Social Connections: Socially Isolated (10/10/2021)   Social Connection and Isolation Panel [NHANES]    Frequency of Communication with Friends and Family: Once a week    Frequency of Social Gatherings with Friends and Family: Never    Attends Religious Services: Never    Printmaker: No    Attends Music therapist: Never    Marital Status: Married  Human resources officer Violence: Not on file   Family History  Problem Relation Age of Onset   Lung cancer Mother    Dementia Mother    Parkinson's disease Father    Cancer Father        unk type   Diabetes Sister    Heart attack Sister    Breast cancer Sister    Cancer Maternal Uncle        unk types   Dementia Maternal Grandmother    Cancer Maternal Grandmother        unk type   Cancer Other    Dementia Other     Current Outpatient Medications:    ACCU-CHEK GUIDE test strip, USE TO CHECK BLOOD SUGAR UP TO 4 TIMES DAILY AS DIRECTED, Disp: , Rfl:    Accu-Chek Softclix Lancets lancets, SMARTSIG:Topical 1-4 Times Daily, Disp: , Rfl:     acetaminophen (TYLENOL) 500 MG tablet, Take 500 mg by mouth every 6 (six) hours as needed., Disp: , Rfl:    blood glucose meter kit and supplies KIT, Dispense based on patient and insurance preference. Use up to four times daily as directed., Disp: 1 each, Rfl: 1   calcium carbonate (TUMS - DOSED IN MG ELEMENTAL CALCIUM) 500 MG chewable tablet, Chew 1 tablet by mouth daily., Disp: , Rfl:    glipiZIDE (GLUCOTROL) 5 MG tablet, Take 1 tablet (5 mg total) by mouth daily before breakfast., Disp: 30 tablet, Rfl: 1   letrozole (FEMARA) 2.5 MG tablet, Take 1 tablet by mouth once daily, Disp: 30  tablet, Rfl: 0   metFORMIN (GLUCOPHAGE) 500 MG tablet, Take 1 tablet (500 mg total) by mouth daily., Disp: 30 tablet, Rfl: 0   triamcinolone cream (KENALOG) 0.1 %, Apply 1 Application topically 2 (two) times daily., Disp: 80 g, Rfl: 0  Physical exam:  Vitals:   11/04/21 1417  BP: (!) 137/90  Pulse: 64  Resp: 16  Temp: 98 F (36.7 C)  TempSrc: Oral  Weight: 175 lb (79.4 kg)   Physical Exam Constitutional:      General: She is not in acute distress.    Appearance: She is well-developed. She is not ill-appearing.     Comments: accompanied  HENT:     Mouth/Throat:     Pharynx: No oropharyngeal exudate.  Eyes:     General: No scleral icterus. Cardiovascular:     Rate and Rhythm: Normal rate and regular rhythm.  Pulmonary:     Effort: Pulmonary effort is normal.     Breath sounds: Normal breath sounds.  Abdominal:     General: There is no distension.     Palpations: Abdomen is soft.     Tenderness: There is no abdominal tenderness. There is no rebound.  Musculoskeletal:        General: No tenderness or deformity.     Right lower leg: No edema.     Left lower leg: No edema.  Skin:    General: Skin is warm and dry.     Coloration: Skin is not pale.  Neurological:     General: No focal deficit present.     Mental Status: She is alert and oriented to person, place, and time.     Motor: No  weakness.  Psychiatric:        Mood and Affect: Mood normal.        Behavior: Behavior normal.        Latest Ref Rng & Units 11/04/2021    1:26 PM  CMP  Glucose 70 - 99 mg/dL 158   BUN 6 - 20 mg/dL 14   Creatinine 0.44 - 1.00 mg/dL 0.70   Sodium 135 - 145 mmol/L 140   Potassium 3.5 - 5.1 mmol/L 3.6   Chloride 98 - 111 mmol/L 106   CO2 22 - 32 mmol/L 26   Calcium 8.9 - 10.3 mg/dL 9.3       Latest Ref Rng & Units 10/21/2021   10:25 AM  CBC  WBC 4.0 - 10.5 K/uL 4.2   Hemoglobin 12.0 - 15.0 g/dL 13.2   Hematocrit 36.0 - 46.0 % 40.4   Platelets 150 - 400 K/uL 81    Lab Results  Component Value Date   HGBA1C 10.2 (H) 09/18/2021    No results found.  Assessment and plan- Patient is a 61 y.o. female diagnosed with lobular breast cancer who presents to clinic for follow up.   Lobular breast cancer- recommendation was for bilateral mastectomy. On hold d/t surgical risk increased with hA1c > 10.2% (see below). Currently on neo-adjuvant endocrine therapy. Tolerating well. Blood sugars improved. Awaiting appointment with surgery.  Candidiasis- urinary and vulvar. likely related to elevated blood sugars. May not be an ideal candidate for SGLT2 inhibitors as it sounds like she's struggled with yeast for some time. Yeast on UA and on pelvic exam previously. Now s/p diflucan 200 mg daily x 1 day then 100 mg x 6 days. Improved. Continue maintenance measures.  UTI- likely related to hyperglycemia. UA reviewed. Culture did now have significant growth. She is s/p  macrobid 500 mg x 5 days. Improved. Flank pain has resolved. Diabetes Mellitus- type 2. HgA1c > 10. Again reviewed impact of nutrition on blood sugar, how to check sugars. She has had good progress in maintaining a more stable blood sugar. Sugar log reviewed and sugars range from 140s-190s. Previously, surgery recommended hgA1c < 9. Suspect patient is closer to meeting that goal. Continue metformin and glipizide as prescribed. Metformin  refilled. hgA1c pending today. Tolerating well. Consider starting ace-I at next visit for nephro-protective benefits.  Financial insecurity/Underinsured- patient has medicaid pending. She may also quality for some form of disability. May also need assistance navigating resources available to her given her history of psychiatric illness and medical illiteracy. She has been referred to [place] whom she and her husband met with today. Appreciate their ongoing assistance.  No PCP- Has been referred to get PCP; awaiting appt. Reviewed local free and sliding scale clinics in interim.   Disposition: 4 weeks- lab (bmp) and see me- la  Visit Diagnosis 1. Type 2 diabetes mellitus with other specified complication, without long-term current use of insulin ([place])   2. Malignant neoplasm of upper-outer quadrant of left breast in female, estrogen receptor positive ([place])    Patient expressed understanding and was in agreement with this plan. She also understands that She can call clinic at any time with any questions, concerns, or complaints.   Thank you for allowing me to participate in the care of this patient.   Beckey Rutter, DNP, AGNP-C [place] [facility] [place]

## 2021-11-04 NOTE — Progress Notes (Signed)
Pt returns for follow-up. Reports that she still has some vulvar itching and irritation. She also reports that she "pulled a muscle" in her back.

## 2021-11-07 ENCOUNTER — Ambulatory Visit (INDEPENDENT_AMBULATORY_CARE_PROVIDER_SITE_OTHER): Payer: Medicaid Other | Admitting: Surgery

## 2021-11-07 ENCOUNTER — Encounter: Payer: Self-pay | Admitting: Surgery

## 2021-11-07 ENCOUNTER — Ambulatory Visit: Payer: Self-pay | Admitting: Surgery

## 2021-11-07 VITALS — BP 162/99 | HR 72 | Temp 97.8°F | Ht 61.0 in | Wt 175.4 lb

## 2021-11-07 DIAGNOSIS — C50511 Malignant neoplasm of lower-outer quadrant of right female breast: Secondary | ICD-10-CM

## 2021-11-07 DIAGNOSIS — Z170 Estrogen receptor positive status [ER+]: Secondary | ICD-10-CM

## 2021-11-07 DIAGNOSIS — C50412 Malignant neoplasm of upper-outer quadrant of left female breast: Secondary | ICD-10-CM

## 2021-11-07 NOTE — Patient Instructions (Signed)
Our surgery scheduler Pamala Hurry will call you within 24-48 hours to get you scheduled. If you have not heard from her after 48 hours, please call our office. Have the blue sheet available when she calls to write down important information.   If you have any concerns or questions, please feel free to call our office.   Total or Modified Radical Mastectomy A total mastectomy and a modified radical mastectomy are surgeries that are done as part of treatment for breast cancer. Both types involve removing a breast. In a total mastectomy (simple mastectomy), all breast tissue including the nipple will be removed. In a modified radical mastectomy, lymph nodes under the arm will be removed along with the breast and nipple. Some of the lining over the muscle tissues under the breast may also be removed. These procedures may also be used to help prevent breast cancer. A preventive (prophylactic) mastectomy may be done if you are at an increased risk of breast cancer due to harmful changes (mutations) in certain genes, such as the BRCA genes. In that case, the procedure involves removing both of your breasts. This can reduce your risk of developing breast cancer in the future. For a transgender person, a total mastectomy may be done as part of a surgical transition from female to female. Let your health care provider know about: Any allergies you have. All medicines you are taking, including vitamins, herbs, eye drops, creams, and over-the-counter medicines. Any problems you or family members have had with anesthetic medicines. Any bleeding problems you have. Any surgeries you have had. Any medical conditions you have. Whether you are pregnant or may be pregnant. What are the risks? Generally, this is a safe procedure. However, problems may occur, including: Infection. Bleeding. Allergic reactions to medicines. Scar tissue. Chest numbness, sensation of throbbing, or tingling on the side of the  surgery. Fluid buildup under the skin flaps where your breast was removed (seroma). Stress or sadness from losing your breast. If you have the lymph nodes under your arm removed, you may have arm swelling, weakness, or numbness on the same side of your body as your surgery. What happens before the procedure? When to stop eating and drinking Follow instructions from your health care provider about what you may eat and drink before your procedure. These may include: 8 hours before your procedure Stop eating most foods. Do not eat meat, fried foods, or fatty foods. Eat only light foods, such as toast or crackers. All liquids are okay except energy drinks and alcohol. 6 hours before your procedure Stop eating. Drink only clear liquids, such as water, clear fruit juice, black coffee, plain tea, and sports drinks. Do not drink energy drinks or alcohol. 2 hours before your procedure Stop drinking all liquids. You may be allowed to take medicines with small sips of water. If you do not follow your health care provider's instructions, your procedure may be delayed or canceled. Medicines Ask your health care provider about: Changing or stopping your regular medicines. This is especially important if you are taking diabetes medicines or blood thinners. Taking medicines such as aspirin and ibuprofen. These medicines can thin your blood. Do not take these medicines unless your health care provider tells you to take them. Taking over-the-counter medicines, vitamins, herbs, and supplements. General instructions You may be checked for extra fluid around your lymph nodes (lymphedema). Do not use any products that contain nicotine or tobacco before the procedure. These products include cigarettes, chewing tobacco, and vaping devices, such  as e-cigarettes. If you need help quitting, ask your health care provider. Ask your health care provider about: How your surgery site will be marked. What steps will be  taken to help prevent infection. These steps may include: Removing hair at the surgery site. Washing skin with a germ-killing soap. Taking antibiotic medicine. What happens during the procedure? An IV will be inserted into one of your veins. You will be given: A medicine to help you relax (sedative). A medicine to make you fall asleep (general anesthetic). A wide incision will be made around your nipple. The skin of the breast and the nipple inside the incision will be removed along with all breast tissue. Lymph nodes under the arm on the side of the tumor will be checked to see if the cancer has spread. If you are having a modified radical mastectomy: The lining over your chest muscles will be removed. The incision may be extended to reach the lymph nodes under your arm, or a second incision may be made. Lymph nodes will be removed. Breast tissue and lymph nodes that are removed will be sent to the lab for testing. You may have a drainage tube inserted into your incision to collect fluid that builds up after surgery. This tube will be connected to a suction bulb on the outside of your body to remove the fluid. Your incision or incisions will be closed with stitches (sutures), skin glue, or adhesive strips. A bandage (dressing) will be placed over your breast area. If lymph nodes were removed, a dressing will also be placed under your arm. The procedure may vary among health care providers and hospitals. What happens after the procedure? Your blood pressure, heart rate, breathing rate, and blood oxygen level will be monitored until you leave the hospital or clinic. You will be given pain medicine as needed. Your IV can be removed when you are able to eat and drink. You may have a drainage tube in place for 2-3 days to prevent a collection of blood (hematoma) from developing in the breast area. You will be given instructions about caring for the drain before you go home. A pressure bandage may  be applied for 1-2 days to prevent bleeding or swelling. Ask your health care provider how to care for your pressure bandage at home. Summary In a total mastectomy (simple mastectomy), all breast tissue including the nipple will be removed. In a modified radical mastectomy, lymph nodes under the arm will be removed along with the breast and nipple, and the chest wall lining. Before the procedure, follow instructions from your health care provider about eating and drinking, and ask about changing or stopping your regular medicines. You may have a drainage tube inserted into your incision to collect fluid that builds up after surgery. This tube will be connected to a suction bulb on the outside of your body to remove the fluid. This information is not intended to replace advice given to you by your health care provider. Make sure you discuss any questions you have with your health care provider. Document Revised: 12/16/2020 Document Reviewed: 12/16/2020 [employer] Patient Education  [place].

## 2021-11-07 NOTE — Progress Notes (Signed)
Patient ID: Sharon Woods, female   DOB: 09-24-1960, 61 y.o.   MRN: [id number]  Chief Complaint: Bilateral breast cancer, right axillary nodal positivity.  History of Present Illness Follow-up visit to discuss surgical planning.  Prior scheduled operation deferred to get diabetes under adequate control.  Previously:  Sharon Woods is a 61 y.o. female with invasive mammary carcinoma of bilateral breasts, with suspicious right axillary lymphadenopathy on ultrasound, biopsy confirmed positive for invasive mammary carcinoma with lobular features.  She is ER/PR positive, HER2 negative.  She reports her sister had breast cancer and is now deceased.  She is gravida 3 para 2, SAB 1.  She was first pregnant at the age of 75, now with children aged 85 and 39.  She felt a lump in her left breast, noted some dimpling/deformity of her left breast but denies any breast pain.  She reports having a total hysterectomy with bilateral oophorectomy in 2012.  She reports having utilized both birth control and hormonal replacement therapy. She was seen by Dr. Tasia Catchings, she had an MRI. Dr. Tasia Catchings does not anticipate neoadjuvant therapy to diminish nodal burden, and suspects best to proceed with surgery upfront.  Since her prior visit an MRI added additional extension of the left sided tissue to warrant additional biopsies prior to considering any left breast conservation.  Considering these and the patient social situation we deemed it least risky to proceed with bilateral mastectomies and avoid potential noncompliance with radiation.  I believe the patient also desires to avoid radiation when discussed in the office today with she and her husband.  Past Medical History Past Medical History:  Diagnosis Date   Anxiety    History of high cholesterol 2000   Hypertension    Patient denies medical problems       Past Surgical History:  Procedure Laterality Date   ABDOMINAL HYSTERECTOMY     BREAST BIOPSY Right 08/21/2021   [place]  bx 7:00 mass coil clip path pending   BREAST BIOPSY Right 08/21/2021   [place] bx of LN, hydro marker, path pending   BREAST BIOPSY Left 08/21/2021   [place] bx, heart marker, path pending   CESAREAN SECTION     x2   NO PAST SURGERIES      Allergies  Allergen Reactions   Tylenol [Acetaminophen]     Per pt due to cirrhosis of the liver   Aleve [Naproxen] Rash    Current Outpatient Medications  Medication Sig Dispense Refill   ACCU-CHEK GUIDE test strip USE TO CHECK BLOOD SUGAR UP TO 4 TIMES DAILY AS DIRECTED     Accu-Chek Softclix Lancets lancets SMARTSIG:Topical 1-4 Times Daily     acetaminophen (TYLENOL) 500 MG tablet Take 500 mg by mouth every 6 (six) hours as needed.     blood glucose meter kit and supplies KIT Dispense based on patient and insurance preference. Use up to four times daily as directed. 1 each 1   calcium carbonate (TUMS - DOSED IN MG ELEMENTAL CALCIUM) 500 MG chewable tablet Chew 1 tablet by mouth daily.     glipiZIDE (GLUCOTROL) 5 MG tablet Take 1 tablet (5 mg total) by mouth daily before breakfast. 30 tablet 1   letrozole (FEMARA) 2.5 MG tablet Take 1 tablet by mouth once daily 30 tablet 0   metFORMIN (GLUCOPHAGE) 500 MG tablet Take 1 tablet (500 mg total) by mouth daily. 90 tablet 1   triamcinolone cream (KENALOG) 0.1 % Apply 1 Application topically 2 (two) times daily. 80  g 0   No current facility-administered medications for this visit.    Family History Family History  Problem Relation Age of Onset   Lung cancer Mother    Dementia Mother    Parkinson's disease Father    Cancer Father        unk type   Diabetes Sister    Heart attack Sister    Breast cancer Sister    Cancer Maternal Uncle        unk types   Dementia Maternal Grandmother    Cancer Maternal Grandmother        unk type   Cancer Other    Dementia Other       Social History Social History   Tobacco Use   Smoking status: Former    Types: Cigarettes    Quit date: 2013    Years since  quitting: 10.6   Smokeless tobacco: Never  Vaping Use   Vaping Use: Never used  Substance Use Topics   Alcohol use: Yes    Comment: occasionally   Drug use: No        Review of Systems  Constitutional: Negative.   HENT: Negative.    Eyes: Negative.   Respiratory: Negative.    Cardiovascular: Negative.   Gastrointestinal:  Positive for constipation, diarrhea, heartburn and nausea.  Genitourinary: Negative.   Skin:  Positive for itching and rash.  Neurological: Negative.   Psychiatric/Behavioral: Negative.        Physical Exam Blood pressure (!) 162/99, pulse 72, temperature 97.8 F (36.6 C), temperature source Oral, height 5' 1" (1.549 m), weight 175 lb 6.4 oz (79.6 kg), SpO2 98 %. Last Weight  Most recent update: 11/07/2021  8:53 AM    Weight  79.6 kg (175 lb 6.4 oz)              CONSTITUTIONAL: Well developed, and nourished, appropriately responsive and aware without distress.  I am concerned that cognitively she is not fully grasping all that has been shared with her, and fully appreciating the challenges of future treatment.  Her husband seems quite attentive and helping her with explanations and logistics. EYES: Sclera non-icteric.   EARS, NOSE, MOUTH AND THROAT:  The oropharynx is clear. Oral mucosa is pink and moist.  Dentition: Poor.   Hearing is intact to voice.  NECK: Trachea is midline, and there is no jugular venous distension.  LYMPH NODES:  Lymph nodes in the neck are not enlarged. RESPIRATORY:  Lungs are clear, and breath sounds are equal bilaterally. Normal respiratory effort without pathologic use of accessory muscles. CARDIOVASCULAR: Heart is regular in rate and rhythm. GI: The abdomen is  soft, nontender, and nondistended. There were no palpable masses. I did not appreciate hepatosplenomegaly. There were normal bowel sounds. GU: Sharon Woods is present as chaperone: Left breast is notable for a fold in the outer lower quadrant consistent with some degree of  tethering.  It is now remarkably bruised and there is a vague mass effect from the ecchymotic tissues. I find no appreciable palpable mass in the right breast despite compensating for the biopsy site, exam is limited due to tenderness.  There are Steri-Strips present in the axilla as well.  But there is no evidence of skin tethering or changes in the right breast.  Skin appears grossly normal on the left aside from the ecchymotic changes. MUSCULOSKELETAL:  Symmetrical muscle tone appreciated in all four extremities.    SKIN: Skin turgor is normal. No pathologic skin lesions appreciated.  NEUROLOGIC:  Motor and sensation appear grossly normal.  Cranial nerves are grossly without defect. PSYCH:  Alert and oriented to person, place and time. Affect is appropriate for situation.  Data Reviewed I have personally reviewed what is currently available of the patient's imaging, recent labs and medical records.   Labs:     Latest Ref Rng & Units 10/21/2021   10:25 AM 10/08/2021   12:01 PM 08/28/2021   11:43 AM  CBC  WBC 4.0 - 10.5 K/uL 4.2  4.2  4.7   Hemoglobin 12.0 - 15.0 g/dL 13.2  14.0  14.5   Hematocrit 36.0 - 46.0 % 40.4  41.3  43.3   Platelets 150 - 400 K/uL 81  86  94       Latest Ref Rng & Units 11/04/2021    1:26 PM 10/21/2021   10:25 AM 10/08/2021   12:01 PM  CMP  Glucose 70 - 99 mg/dL 158  176  159   BUN 6 - 20 mg/dL _0 Creatinine 0.44 - 1.00 mg/dL 0.70  0.60  0.58   Sodium 135 - 145 mmol/L 140  137  138   Potassium 3.5 - 5.1 mmol/L 3.6  3.8  3.4   Chloride 98 - 111 mmol/L 106  109  107   CO2 22 - 32 mmol/L _1 Calcium 8.9 - 10.3 mg/dL 9.3  8.8  8.9   Total Protein 6.5 - 8.1 g/dL  7.5  7.6   Total Bilirubin 0.3 - 1.2 mg/dL  0.5  1.0   Alkaline Phos 38 - 126 U/L  62  68   AST 15 - 41 U/L  26  31   ALT 0 - 44 U/L  24  25    SURGICAL PATHOLOGY  * THIS IS AN ADDENDUM REPORT *  CASE: [id number]  PATIENT: Sharon Woods  Surgical Pathology Report  *Addendum *    Reason for Addendum #1:  Breast Biomarker Results   Specimen Submitted:  A. Breast, left  B. Breast, right  C. Lymph node, right axilla   Clinical History: Bilateral highly suspicious breast masses (1.7 cm left  breast and 1.0 cm right breast) and multiple enlarged/morphologically  abnormal lymph nodes in the right axilla. 1: left breast 3:00 (heart).  2: right breast 7:00 (coil). 3: right axillary lymph node (HydroMARK)       DIAGNOSIS:  A.  BREAST MASS, LEFT 3:00 6 CM FN; ULTRASOUND-GUIDED BIOPSY:  - INVASIVE MAMMARY CARCINOMA WITH LOBULAR FEATURES.  - IN SITU CARCINOMA, SEE COMMENT.  - LOBULAR NEOPLASIA.  Size of invasive carcinoma: 6 mm in this sample  Histologic grade of invasive carcinoma: Grade 2                       Glandular/tubular differentiation score: 3                       Nuclear pleomorphism score: 2                       Mitotic rate score: 1                       Total score: 6  Lymphovascular invasion: Not identified   B.  BREAST MASS, RIGHT 7:00 5 CM FN; ULTRASOUND-GUIDED BIOPSY:  - INVASIVE MAMMARY CARCINOMA WITH LOBULAR FEATURES.  - IN SITU  CARCINOMA, SEE COMMENT.  Size of invasive carcinoma: 13 mm in this sample  Histologic grade of invasive carcinoma: Grade 2                       Glandular/tubular differentiation score: 3                       Nuclear pleomorphism score: 2                       Mitotic rate score: 1                       Total score: 6  Lymphovascular invasion: Not identified   C.  LYMPH NODE, RIGHT AXILLA; ULTRASOUND-GUIDED BIOPSY:  - METASTATIC MAMMARY CARCINOMA, LARGEST FOCUS MEASURING 11 MM.  - EXTRACAPSULAR EXTENSION IS PRESENT.   Comment:  The in situ carcinoma component in the left and right breast biopsies  (parts A and B) has mixed ductal and lobular features. Definitive  classification of the in situ component and grade of the invasive  carcinomas will be assigned on the excisional specimen.  ER/PR/HER2:  Immunohistochemistry will be performed on blocks A1 and B1,  with reflex to [place] for HER2 2+. The results will be reported in an  addendum.   GROSS DESCRIPTION:  A. Labeled: Left breast 3:00 6 cm from the nipple  Received: Formalin  Time/date in fixative: Collected and placed in formalin at 8:15 AM on  08/21/2021  Cold ischemic time: Less than 1 minute  Total fixation time: Approximately 9 hours  Core pieces: 3  Size: Ranges from 0.7-1.0 cm in length and 0.1 cm in diameter  Description: Pink to yellow cores of fibrofatty tissue  Ink color: Green  Entirely submitted in 1 cassette.   B. Labeled: Right breast 7:00 5 cm from the nipple  Received: Formalin  Time/date in fixative: Collected and placed in formalin at 8:45 AM on  08/21/2021  Cold ischemic time: Less than 1 minute  Total fixation time: Approximately 8.5 hours  Core pieces: 3  Size: Ranges from 1.1-1.5 cm in length and 0.1 cm in diameter  Description: Pink to yellow cores of fibrofatty tissue  Ink color: Black  Entirely submitted in 1 cassette.   C. Labeled: Right axillary lymph node  Received: Formalin  Time/date in fixative: Collected and placed in formalin at 9:00 AM on  08/21/2021  Cold ischemic time: Less than 1 minute  Total fixation time: Approximately 8.4 hours  Core pieces: 3 plus additional fragments  Size: Ranges from 0.9-1.3 cm in length and 0.1 cm in diameter  Description: Tan cores of soft tissue  Ink color: Blue  Entirely submitted in 1 cassette.   CM 08/21/2021   Final Diagnosis performed by Quay Burow, MD.   Electronically signed  08/22/2021 10:14:58AM  The electronic signature indicates that the named Attending Pathologist  has evaluated the specimen  Technical component performed at [facility], [address] [place],  [place] [address] Lab: [phone] Dir: Rush Farmer, MD, MMM   Professional component performed at [facility], [facility], [place], [place], [place]  [address] Lab: [phone]  Dir: Kathi Simpers, MD   ADDENDUM:  CASE SUMMARY: BREAST BIOMARKER TESTS - Part A.  LEFT 3:00 6 CM FN  (heart):  Estrogen Receptor (ER) Status: POSITIVE          Percentage of cells with nuclear positivity:  Greater than 90%          Average intensity of staining: Strong   Progesterone Receptor (PgR) Status: POSITIVE          Percentage of cells with nuclear positivity: Greater than 90%          Average intensity of staining: Moderate   HER2 (by immunohistochemistry): NEGATIVE (1+)   Ki-67: Not performed   CASE SUMMARY: BREAST BIOMARKER TESTS - Part B. RIGHT 7:00 5 CM FN  (coil):  Estrogen Receptor (ER) Status: POSITIVE          Percentage of cells with nuclear positivity: Greater than 90%          Average intensity of staining: Strong   Progesterone Receptor (PgR) Status: POSITIVE          Percentage of cells with nuclear positivity: 51 to 90%          Average intensity of staining: Moderate   HER2 (by immunohistochemistry): NEGATIVE (1+)   Ki-67: Not performed   Cold Ischemia and Fixation Times: Meet requirements specified in latest  version of the ASCO/CAP guidelines  Testing Performed on Block Number(s): A1 and B1    Imaging: CLINICAL DATA:  Palpable lump left breast   EXAM: DIGITAL DIAGNOSTIC BILATERAL MAMMOGRAM WITH TOMOSYNTHESIS AND CAD; ULTRASOUND LEFT BREAST LIMITED; ULTRASOUND RIGHT BREAST LIMITED   TECHNIQUE: Bilateral digital diagnostic mammography and breast tomosynthesis was performed. The images were evaluated with computer-aided detection.; Targeted ultrasound examination of the left breast was performed.; Targeted ultrasound examination of the right breast was performed   COMPARISON:  Previous exam(s).   ACR Breast Density Category c: The breast tissue is heterogeneously dense, which may obscure small masses.   FINDINGS: Cc and MLO views of bilateral breasts, spot tangential view of left breast are submitted. At the  palpable area lateral left breast, there is a spiculated mass with associated calcifications. In the slight lateral lower right breast, there is a spiculated mass.   Targeted ultrasound is performed, showing angulated hypoechoic mass at palpable area left breast 3 o'clock cm from nipple measuring 1.7 x 1.7 x 1.6 cm correlating to. Ultrasound of the left axilla is negative.   Ultrasound right breast demonstrates a 1 x 0.7 x 0.8 cm angulated spiculated mass at right breast 7 o'clock 5 cm from nipple correlating to the mammographic mass. Ultrasound of the right axilla demonstrates at 3 abnormal thickened cortex lymph node.   IMPRESSION: Highly suspicious findings.   RECOMMENDATION: Recommend ultrasound-guided core biopsies x3 of bilateral breast mass and abnormal right axillary lymph node.   I have discussed the findings and recommendations with the patient. If applicable, a reminder letter will be sent to the patient regarding the next appointment.   BI-RADS CATEGORY  5: Highly suggestive of malignancy.     Electronically Signed   By: Abelardo Diesel M.D.   On: 08/06/2021 12:53   Within last 24 hrs: No results found.  CLINICAL DATA:  Recent diagnosis of bilateral breast cancer. Biopsy of mass in the 3 o'clock location of the LEFT breast showed in faces mammary carcinoma with lobular features and in-situ carcinoma, lobular neoplasia. Biopsy of mass in the 7 o'clock location of the RIGHT breast showed invasive mammary carcinoma with lobular features and in-situ carcinoma. Biopsy of RIGHT axillary lymph node shows metastatic mammary carcinoma.   EXAM: BILATERAL BREAST MRI WITH AND WITHOUT CONTRAST   TECHNIQUE: Multiplanar, multisequence MR images of both breasts were obtained prior to and following the intravenous  administration of 7.5 ml of Gadavist   Three-dimensional MR images were rendered by post-processing of the original MR data on an independent workstation.  The three-dimensional MR images were interpreted, and findings are reported in the following complete MRI report for this study. Three dimensional images were evaluated at the independent interpreting workstation using the DynaCAD thin client.   COMPARISON:  08/21/2021 and earlier   FINDINGS: Breast composition: c. Heterogeneous fibroglandular tissue.   Background parenchymal enhancement: Minimal   Right breast: Within the LOWER OUTER QUADRANT of the RIGHT breast, at middle depth, there is an enhancing mass measuring 2.5 x 1.4 by 1.3 centimeters and associated with tissue marker clip artifact. Mass demonstrates washout kinetics and is consistent with known malignancy. Otherwise the RIGHT breast is negative.   Left breast: Within the LOWER OUTER QUADRANT of the LEFT breast, there is an enhancing mass measuring at least 3.4 x 2.6 by 2.6 centimeters. Tissue marker clip artifact and 2.8 centimeter post biopsy hematoma are associated with mass. Posterior to the mass, there is additional non mass enhancement showing primarily persistent kinetics and measuring 2.9 x 2.5 x 2.9 centimeters. A 5 millimeter enhancing satellite masses identified along the LATERAL aspect of the lesion (image 50 of series 6). The non mass enhancement in the posterior aspect of the LOWER OUTER QUADRANT of the LEFT breast is contiguous with the pectoralis muscle. The pectoralis muscle is tented with the patient prone position. However, there is no enhancement of the pectoralis. Measured together, the mass and non mass enhancement are 5.1 x 3.2 x 2.9 centimeters. LEFT breast is otherwise negative.   Lymph nodes: Numerous enlarged RIGHT axillary lymph nodes are present. There are at least 4 morphologically abnormal lymph nodes, 1 of which is associated with biopsy changes after recent ultrasound-guided core biopsy showing metastatic disease. LEFT axilla is unremarkable.   Ancillary findings:  None.    IMPRESSION: 1. 2.5 centimeter enhancing mass in the LOWER OUTER QUADRANT of the RIGHT breast, correlating with known malignancy. 2. At least 4 enlarged RIGHT axillary lymph nodes, correlating well with recently biopsied lymph node showing metastatic disease. 3. 3.4 centimeter enhancing mass in the LEFT breast, with an additional 2.9 centimeters of non mass enhancement posterior to the mass also suspicious for malignancy. This likely correlates with the area calcifications seen mammographically. 4. 5 millimeters satellite nodule along the LATERAL aspect of known malignancy in the LOWER OUTER QUADRANT of the LEFT breast. 5. LEFT axilla is negative for adenopathy.   RECOMMENDATION: 1. If LEFT breast conservation is being considered, recommend stereotactic guided core biopsy of asymmetry associated with calcifications in the LOWER OUTER QUADRANT of the LEFT breast to document extent of disease. 2. Treatment plan for known bilateral breast malignancy.   BI-RADS CATEGORY  4: Suspicious.     Electronically Signed   By: Nolon Nations M.D.   On: 08/29/2021 17:07   Assessment    It appears her diabetes is under significantly improved control.  Her last random glucose was 158 on record.  And her last hemoglobin A1c was 7.8 on August 7.  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 Left breast cancer. I am concerned about the overall follow-through of this patient and her family with the burden of presenting for regular radiation treatments for breast conservation.  We have considered the high risks of positive margins with attempted lumpectomy, and the need for additional biopsies to warrant that this is an isolated lesion on the left.. If there is no  role for neoadjuvant, I suspect we will have at least 3 positive lymph nodes based on imaging, and may be wise to just proceed with a right axillary dissection, I still question the value of a left sentinel lymph node biopsy.     Patient Active Problem List   Diagnosis Date Noted   Diabetes mellitus ([place]) 10/08/2021   Genetic testing 10/07/2021   Diabetes ([place]) 09/28/2021   Vaginitis 09/28/2021   Thrombocytopenia ([place]) 09/08/2021   Malignant neoplasm of left breast ([place]) 08/28/2021   Malignant neoplasm of lower-outer quadrant of right breast of female, estrogen receptor positive ([place]) 08/28/2021   Goals of care, counseling/discussion 08/28/2021   Severe recurrent major depression with psychotic features ([place]) 11/15/2015   Hypertension 11/15/2015   Noncompliance 11/15/2015   Severe major depression, single episode, with psychotic features, mood-congruent ([place]) 03/29/2015   Protein-calorie malnutrition, severe 03/26/2015   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    Plan    Therefore we will proceed with right modified radical mastectomy with left total mastectomy and sentinel lymph node biopsy. Risks and benefits of the above procedure discussed in detail with the patient and her husband these include but are not limited to anesthesia, bleeding, infection, postoperative seroma, postoperative lymphedema, neuropathy, etc.  Believe they understand the risks of the above and desired to proceed.  He also does understand that we have agreed to defer any plastic surgical evaluation/consultation and deal primarily with the patient's disease at the present time. They are in agreement with not pursuing breast conservation at this time.  Face-to-face time spent with the patient and accompanying care providers(if present) was 35 minutes, with more than 50% of the time spent counseling, educating, and coordinating care of the patient.    These notes generated with voice recognition software. I apologize for typographical errors.  [name] M.D., FACS 11/07/2021, 9:24 AM

## 2021-11-07 NOTE — H&P (View-Only) (Signed)
Patient ID: Sharon Woods, female   DOB: 05/20/1960, 61 y.o.   MRN: [id number]  Chief Complaint: Bilateral breast cancer, right axillary nodal positivity.  History of Present Illness Follow-up visit to discuss surgical planning.  Prior scheduled operation deferred to get diabetes under adequate control.  Previously:  Sharon Woods is a 61 y.o. female with invasive mammary carcinoma of bilateral breasts, with suspicious right axillary lymphadenopathy on ultrasound, biopsy confirmed positive for invasive mammary carcinoma with lobular features.  She is ER/PR positive, HER2 negative.  She reports her sister had breast cancer and is now deceased.  She is gravida 3 para 2, SAB 1.  She was first pregnant at the age of 19, now with children aged 41 and 26.  She felt a lump in her left breast, noted some dimpling/deformity of her left breast but denies any breast pain.  She reports having a total hysterectomy with bilateral oophorectomy in 2012.  She reports having utilized both birth control and hormonal replacement therapy. She was seen by Dr. Yu, she had an MRI. Dr. Yu does not anticipate neoadjuvant therapy to diminish nodal burden, and suspects best to proceed with surgery upfront.  Since her prior visit an MRI added additional extension of the left sided tissue to warrant additional biopsies prior to considering any left breast conservation.  Considering these and the patient social situation we deemed it least risky to proceed with bilateral mastectomies and avoid potential noncompliance with radiation.  I believe the patient also desires to avoid radiation when discussed in the office today with she and her husband.  Past Medical History Past Medical History:  Diagnosis Date   Anxiety    History of high cholesterol 2000   Hypertension    Patient denies medical problems       Past Surgical History:  Procedure Laterality Date   ABDOMINAL HYSTERECTOMY     BREAST BIOPSY Right 08/21/2021   us  bx 7:00 mass coil clip path pending   BREAST BIOPSY Right 08/21/2021   us bx of LN, hydro marker, path pending   BREAST BIOPSY Left 08/21/2021   us bx, heart marker, path pending   CESAREAN SECTION     x2   NO PAST SURGERIES      Allergies  Allergen Reactions   Tylenol [Acetaminophen]     Per pt due to cirrhosis of the liver   Aleve [Naproxen] Rash    Current Outpatient Medications  Medication Sig Dispense Refill   ACCU-CHEK GUIDE test strip USE TO CHECK BLOOD SUGAR UP TO 4 TIMES DAILY AS DIRECTED     Accu-Chek Softclix Lancets lancets SMARTSIG:Topical 1-4 Times Daily     acetaminophen (TYLENOL) 500 MG tablet Take 500 mg by mouth every 6 (six) hours as needed.     blood glucose meter kit and supplies KIT Dispense based on patient and insurance preference. Use up to four times daily as directed. 1 each 1   calcium carbonate (TUMS - DOSED IN MG ELEMENTAL CALCIUM) 500 MG chewable tablet Chew 1 tablet by mouth daily.     glipiZIDE (GLUCOTROL) 5 MG tablet Take 1 tablet (5 mg total) by mouth daily before breakfast. 30 tablet 1   letrozole (FEMARA) 2.5 MG tablet Take 1 tablet by mouth once daily 30 tablet 0   metFORMIN (GLUCOPHAGE) 500 MG tablet Take 1 tablet (500 mg total) by mouth daily. 90 tablet 1   triamcinolone cream (KENALOG) 0.1 % Apply 1 Application topically 2 (two) times daily. 80   g 0   No current facility-administered medications for this visit.    Family History Family History  Problem Relation Age of Onset   Lung cancer Mother    Dementia Mother    Parkinson's disease Father    Cancer Father        unk type   Diabetes Sister    Heart attack Sister    Breast cancer Sister    Cancer Maternal Uncle        unk types   Dementia Maternal Grandmother    Cancer Maternal Grandmother        unk type   Cancer Other    Dementia Other       Social History Social History   Tobacco Use   Smoking status: Former    Types: Cigarettes    Quit date: 2013    Years since  quitting: 10.6   Smokeless tobacco: Never  Vaping Use   Vaping Use: Never used  Substance Use Topics   Alcohol use: Yes    Comment: occasionally   Drug use: No        Review of Systems  Constitutional: Negative.   HENT: Negative.    Eyes: Negative.   Respiratory: Negative.    Cardiovascular: Negative.   Gastrointestinal:  Positive for constipation, diarrhea, heartburn and nausea.  Genitourinary: Negative.   Skin:  Positive for itching and rash.  Neurological: Negative.   Psychiatric/Behavioral: Negative.        Physical Exam Blood pressure (!) 162/99, pulse 72, temperature 97.8 F (36.6 C), temperature source Oral, height 5' 1" (1.549 m), weight 175 lb 6.4 oz (79.6 kg), SpO2 98 %. Last Weight  Most recent update: 11/07/2021  8:53 AM    Weight  79.6 kg (175 lb 6.4 oz)              CONSTITUTIONAL: Well developed, and nourished, appropriately responsive and aware without distress.  I am concerned that cognitively she is not fully grasping all that has been shared with her, and fully appreciating the challenges of future treatment.  Her husband seems quite attentive and helping her with explanations and logistics. EYES: Sclera non-icteric.   EARS, NOSE, MOUTH AND THROAT:  The oropharynx is clear. Oral mucosa is pink and moist.  Dentition: Poor.   Hearing is intact to voice.  NECK: Trachea is midline, and there is no jugular venous distension.  LYMPH NODES:  Lymph nodes in the neck are not enlarged. RESPIRATORY:  Lungs are clear, and breath sounds are equal bilaterally. Normal respiratory effort without pathologic use of accessory muscles. CARDIOVASCULAR: Heart is regular in rate and rhythm. GI: The abdomen is  soft, nontender, and nondistended. There were no palpable masses. I did not appreciate hepatosplenomegaly. There were normal bowel sounds. GU: Freda Munro is present as chaperone: Left breast is notable for a fold in the outer lower quadrant consistent with some degree of  tethering.  It is now remarkably bruised and there is a vague mass effect from the ecchymotic tissues. I find no appreciable palpable mass in the right breast despite compensating for the biopsy site, exam is limited due to tenderness.  There are Steri-Strips present in the axilla as well.  But there is no evidence of skin tethering or changes in the right breast.  Skin appears grossly normal on the left aside from the ecchymotic changes. MUSCULOSKELETAL:  Symmetrical muscle tone appreciated in all four extremities.    SKIN: Skin turgor is normal. No pathologic skin lesions appreciated.  NEUROLOGIC:  Motor and sensation appear grossly normal.  Cranial nerves are grossly without defect. PSYCH:  Alert and oriented to person, place and time. Affect is appropriate for situation.  Data Reviewed I have personally reviewed what is currently available of the patient's imaging, recent labs and medical records.   Labs:     Latest Ref Rng & Units 10/21/2021   10:25 AM 10/08/2021   12:01 PM 08/28/2021   11:43 AM  CBC  WBC 4.0 - 10.5 K/uL 4.2  4.2  4.7   Hemoglobin 12.0 - 15.0 g/dL 13.2  14.0  14.5   Hematocrit 36.0 - 46.0 % 40.4  41.3  43.3   Platelets 150 - 400 K/uL 81  86  94       Latest Ref Rng & Units 11/04/2021    1:26 PM 10/21/2021   10:25 AM 10/08/2021   12:01 PM  CMP  Glucose 70 - 99 mg/dL 158  176  159   BUN 6 - 20 mg/dL _0 Creatinine 0.44 - 1.00 mg/dL 0.70  0.60  0.58   Sodium 135 - 145 mmol/L 140  137  138   Potassium 3.5 - 5.1 mmol/L 3.6  3.8  3.4   Chloride 98 - 111 mmol/L 106  109  107   CO2 22 - 32 mmol/L _1 Calcium 8.9 - 10.3 mg/dL 9.3  8.8  8.9   Total Protein 6.5 - 8.1 g/dL  7.5  7.6   Total Bilirubin 0.3 - 1.2 mg/dL  0.5  1.0   Alkaline Phos 38 - 126 U/L  62  68   AST 15 - 41 U/L  26  31   ALT 0 - 44 U/L  24  25    SURGICAL PATHOLOGY  * THIS IS AN ADDENDUM REPORT *  CASE: [id number]  PATIENT: Charo Gregson  Surgical Pathology Report  *Addendum *    Reason for Addendum #1:  Breast Biomarker Results   Specimen Submitted:  A. Breast, left  B. Breast, right  C. Lymph node, right axilla   Clinical History: Bilateral highly suspicious breast masses (1.7 cm left  breast and 1.0 cm right breast) and multiple enlarged/morphologically  abnormal lymph nodes in the right axilla. 1: left breast 3:00 (heart).  2: right breast 7:00 (coil). 3: right axillary lymph node (HydroMARK)       DIAGNOSIS:  A.  BREAST MASS, LEFT 3:00 6 CM FN; ULTRASOUND-GUIDED BIOPSY:  - INVASIVE MAMMARY CARCINOMA WITH LOBULAR FEATURES.  - IN SITU CARCINOMA, SEE COMMENT.  - LOBULAR NEOPLASIA.  Size of invasive carcinoma: 6 mm in this sample  Histologic grade of invasive carcinoma: Grade 2                       Glandular/tubular differentiation score: 3                       Nuclear pleomorphism score: 2                       Mitotic rate score: 1                       Total score: 6  Lymphovascular invasion: Not identified   B.  BREAST MASS, RIGHT 7:00 5 CM FN; ULTRASOUND-GUIDED BIOPSY:  - INVASIVE MAMMARY CARCINOMA WITH LOBULAR FEATURES.  - IN SITU  CARCINOMA, SEE COMMENT.  Size of invasive carcinoma: 13 mm in this sample  Histologic grade of invasive carcinoma: Grade 2                       Glandular/tubular differentiation score: 3                       Nuclear pleomorphism score: 2                       Mitotic rate score: 1                       Total score: 6  Lymphovascular invasion: Not identified   C.  LYMPH NODE, RIGHT AXILLA; ULTRASOUND-GUIDED BIOPSY:  - METASTATIC MAMMARY CARCINOMA, LARGEST FOCUS MEASURING 11 MM.  - EXTRACAPSULAR EXTENSION IS PRESENT.   Comment:  The in situ carcinoma component in the left and right breast biopsies  (parts A and B) has mixed ductal and lobular features. Definitive  classification of the in situ component and grade of the invasive  carcinomas will be assigned on the excisional specimen.  ER/PR/HER2:  Immunohistochemistry will be performed on blocks A1 and B1,  with reflex to FISH for HER2 2+. The results will be reported in an  addendum.   GROSS DESCRIPTION:  A. Labeled: Left breast 3:00 6 cm from the nipple  Received: Formalin  Time/date in fixative: Collected and placed in formalin at 8:15 AM on  08/21/2021  Cold ischemic time: Less than 1 minute  Total fixation time: Approximately 9 hours  Core pieces: 3  Size: Ranges from 0.7-1.0 cm in length and 0.1 cm in diameter  Description: Pink to yellow cores of fibrofatty tissue  Ink color: Green  Entirely submitted in 1 cassette.   B. Labeled: Right breast 7:00 5 cm from the nipple  Received: Formalin  Time/date in fixative: Collected and placed in formalin at 8:45 AM on  08/21/2021  Cold ischemic time: Less than 1 minute  Total fixation time: Approximately 8.5 hours  Core pieces: 3  Size: Ranges from 1.1-1.5 cm in length and 0.1 cm in diameter  Description: Pink to yellow cores of fibrofatty tissue  Ink color: Black  Entirely submitted in 1 cassette.   C. Labeled: Right axillary lymph node  Received: Formalin  Time/date in fixative: Collected and placed in formalin at 9:00 AM on  08/21/2021  Cold ischemic time: Less than 1 minute  Total fixation time: Approximately 8.4 hours  Core pieces: 3 plus additional fragments  Size: Ranges from 0.9-1.3 cm in length and 0.1 cm in diameter  Description: Tan cores of soft tissue  Ink color: Blue  Entirely submitted in 1 cassette.   CM 08/21/2021   Final Diagnosis performed by Tara Rubinas, MD.   Electronically signed  08/22/2021 10:14:58AM  The electronic signature indicates that the named Attending Pathologist  has evaluated the specimen  Technical component performed at [facility], [address], [place],  [place] [address] Lab: [phone] Dir: Sanjai Nagendra, MD, MMM   Professional component performed at [facility], [facility], [address], [place], [place]  [address] Lab: [phone]  Dir: Heath M. Jones, MD   ADDENDUM:  CASE SUMMARY: BREAST BIOMARKER TESTS - Part A.  LEFT 3:00 6 CM FN  (heart):  Estrogen Receptor (ER) Status: POSITIVE          Percentage of cells with nuclear positivity:   Greater than 90%          Average intensity of staining: Strong   Progesterone Receptor (PgR) Status: POSITIVE          Percentage of cells with nuclear positivity: Greater than 90%          Average intensity of staining: Moderate   HER2 (by immunohistochemistry): NEGATIVE (1+)   Ki-67: Not performed   CASE SUMMARY: BREAST BIOMARKER TESTS - Part B. RIGHT 7:00 5 CM FN  (coil):  Estrogen Receptor (ER) Status: POSITIVE          Percentage of cells with nuclear positivity: Greater than 90%          Average intensity of staining: Strong   Progesterone Receptor (PgR) Status: POSITIVE          Percentage of cells with nuclear positivity: 51 to 90%          Average intensity of staining: Moderate   HER2 (by immunohistochemistry): NEGATIVE (1+)   Ki-67: Not performed   Cold Ischemia and Fixation Times: Meet requirements specified in latest  version of the ASCO/CAP guidelines  Testing Performed on Block Number(s): A1 and B1    Imaging: CLINICAL DATA:  Palpable lump left breast   EXAM: DIGITAL DIAGNOSTIC BILATERAL MAMMOGRAM WITH TOMOSYNTHESIS AND CAD; ULTRASOUND LEFT BREAST LIMITED; ULTRASOUND RIGHT BREAST LIMITED   TECHNIQUE: Bilateral digital diagnostic mammography and breast tomosynthesis was performed. The images were evaluated with computer-aided detection.; Targeted ultrasound examination of the left breast was performed.; Targeted ultrasound examination of the right breast was performed   COMPARISON:  Previous exam(s).   ACR Breast Density Category c: The breast tissue is heterogeneously dense, which may obscure small masses.   FINDINGS: Cc and MLO views of bilateral breasts, spot tangential view of left breast are submitted. At the  palpable area lateral left breast, there is a spiculated mass with associated calcifications. In the slight lateral lower right breast, there is a spiculated mass.   Targeted ultrasound is performed, showing angulated hypoechoic mass at palpable area left breast 3 o'clock cm from nipple measuring 1.7 x 1.7 x 1.6 cm correlating to. Ultrasound of the left axilla is negative.   Ultrasound right breast demonstrates a 1 x 0.7 x 0.8 cm angulated spiculated mass at right breast 7 o'clock 5 cm from nipple correlating to the mammographic mass. Ultrasound of the right axilla demonstrates at 3 abnormal thickened cortex lymph node.   IMPRESSION: Highly suspicious findings.   RECOMMENDATION: Recommend ultrasound-guided core biopsies x3 of bilateral breast mass and abnormal right axillary lymph node.   I have discussed the findings and recommendations with the patient. If applicable, a reminder letter will be sent to the patient regarding the next appointment.   BI-RADS CATEGORY  5: Highly suggestive of malignancy.     Electronically Signed   By: Abelardo Diesel M.D.   On: 08/06/2021 12:53   Within last 24 hrs: No results found.  CLINICAL DATA:  Recent diagnosis of bilateral breast cancer. Biopsy of mass in the 3 o'clock location of the LEFT breast showed in faces mammary carcinoma with lobular features and in-situ carcinoma, lobular neoplasia. Biopsy of mass in the 7 o'clock location of the RIGHT breast showed invasive mammary carcinoma with lobular features and in-situ carcinoma. Biopsy of RIGHT axillary lymph node shows metastatic mammary carcinoma.   EXAM: BILATERAL BREAST MRI WITH AND WITHOUT CONTRAST   TECHNIQUE: Multiplanar, multisequence MR images of both breasts were obtained prior to and following the intravenous  administration of 7.5 ml of Gadavist   Three-dimensional MR images were rendered by post-processing of the original MR data on an independent workstation.  The three-dimensional MR images were interpreted, and findings are reported in the following complete MRI report for this study. Three dimensional images were evaluated at the independent interpreting workstation using the DynaCAD thin client.   COMPARISON:  08/21/2021 and earlier   FINDINGS: Breast composition: c. Heterogeneous fibroglandular tissue.   Background parenchymal enhancement: Minimal   Right breast: Within the LOWER OUTER QUADRANT of the RIGHT breast, at middle depth, there is an enhancing mass measuring 2.5 x 1.4 by 1.3 centimeters and associated with tissue marker clip artifact. Mass demonstrates washout kinetics and is consistent with known malignancy. Otherwise the RIGHT breast is negative.   Left breast: Within the LOWER OUTER QUADRANT of the LEFT breast, there is an enhancing mass measuring at least 3.4 x 2.6 by 2.6 centimeters. Tissue marker clip artifact and 2.8 centimeter post biopsy hematoma are associated with mass. Posterior to the mass, there is additional non mass enhancement showing primarily persistent kinetics and measuring 2.9 x 2.5 x 2.9 centimeters. A 5 millimeter enhancing satellite masses identified along the LATERAL aspect of the lesion (image 50 of series 6). The non mass enhancement in the posterior aspect of the LOWER OUTER QUADRANT of the LEFT breast is contiguous with the pectoralis muscle. The pectoralis muscle is tented with the patient prone position. However, there is no enhancement of the pectoralis. Measured together, the mass and non mass enhancement are 5.1 x 3.2 x 2.9 centimeters. LEFT breast is otherwise negative.   Lymph nodes: Numerous enlarged RIGHT axillary lymph nodes are present. There are at least 4 morphologically abnormal lymph nodes, 1 of which is associated with biopsy changes after recent ultrasound-guided core biopsy showing metastatic disease. LEFT axilla is unremarkable.   Ancillary findings:  None.    IMPRESSION: 1. 2.5 centimeter enhancing mass in the LOWER OUTER QUADRANT of the RIGHT breast, correlating with known malignancy. 2. At least 4 enlarged RIGHT axillary lymph nodes, correlating well with recently biopsied lymph node showing metastatic disease. 3. 3.4 centimeter enhancing mass in the LEFT breast, with an additional 2.9 centimeters of non mass enhancement posterior to the mass also suspicious for malignancy. This likely correlates with the area calcifications seen mammographically. 4. 5 millimeters satellite nodule along the LATERAL aspect of known malignancy in the LOWER OUTER QUADRANT of the LEFT breast. 5. LEFT axilla is negative for adenopathy.   RECOMMENDATION: 1. If LEFT breast conservation is being considered, recommend stereotactic guided core biopsy of asymmetry associated with calcifications in the LOWER OUTER QUADRANT of the LEFT breast to document extent of disease. 2. Treatment plan for known bilateral breast malignancy.   BI-RADS CATEGORY  4: Suspicious.     Electronically Signed   By: Elizabeth  Brown M.D.   On: 08/29/2021 17:07   Assessment    It appears her diabetes is under significantly improved control.  Her last random glucose was 158 on record.  And her last hemoglobin A1c was 7.8 on August 7.  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 Left breast cancer. I am concerned about the overall follow-through of this patient and her family with the burden of presenting for regular radiation treatments for breast conservation.  We have considered the high risks of positive margins with attempted lumpectomy, and the need for additional biopsies to warrant that this is an isolated lesion on the left.. If there is no   role for neoadjuvant, I suspect we will have at least 3 positive lymph nodes based on imaging, and may be wise to just proceed with a right axillary dissection, I still question the value of a left sentinel lymph node biopsy.     Patient Active Problem List   Diagnosis Date Noted   Diabetes mellitus ([place]) 10/08/2021   Genetic testing 10/07/2021   Diabetes ([place]) 09/28/2021   Vaginitis 09/28/2021   Thrombocytopenia ([place]) 09/08/2021   Malignant neoplasm of left breast ([place]) 08/28/2021   Malignant neoplasm of lower-outer quadrant of right breast of female, estrogen receptor positive ([place]) 08/28/2021   Goals of care, counseling/discussion 08/28/2021   Severe recurrent major depression with psychotic features ([place]) 11/15/2015   Hypertension 11/15/2015   Noncompliance 11/15/2015   Severe major depression, single episode, with psychotic features, mood-congruent ([place]) 03/29/2015   Protein-calorie malnutrition, severe 03/26/2015   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    Plan    Therefore we will proceed with right modified radical mastectomy with left total mastectomy and sentinel lymph node biopsy. Risks and benefits of the above procedure discussed in detail with the patient and her husband these include but are not limited to anesthesia, bleeding, infection, postoperative seroma, postoperative lymphedema, neuropathy, etc.  Believe they understand the risks of the above and desired to proceed.  He also does understand that we have agreed to defer any plastic surgical evaluation/consultation and deal primarily with the patient's disease at the present time. They are in agreement with not pursuing breast conservation at this time.  Face-to-face time spent with the patient and accompanying care providers(if present) was 35 minutes, with more than 50% of the time spent counseling, educating, and coordinating care of the patient.    These notes generated with voice recognition software. I apologize for typographical errors.  [name] M.D., FACS 11/07/2021, 9:24 AM

## 2021-11-08 ENCOUNTER — Telehealth: Payer: Self-pay | Admitting: Surgery

## 2021-11-08 NOTE — Telephone Encounter (Signed)
Patient has been advised of Pre-Admission date/time, and Surgery date.  Surgery Date: 11/15/21 arrive at 10:00 am at [facility] as patient will be having SLN bx injection done first prior to her surgery.    Preadmission Testing Date: 11/13/21 (phone 8a-1p).  Patient verbalized understanding.

## 2021-11-13 ENCOUNTER — Other Ambulatory Visit: Payer: Self-pay | Admitting: Oncology

## 2021-11-13 ENCOUNTER — Other Ambulatory Visit: Payer: Self-pay | Admitting: Nurse Practitioner

## 2021-11-13 ENCOUNTER — Inpatient Hospital Stay: Payer: Medicaid Other

## 2021-11-13 ENCOUNTER — Encounter
Admission: RE | Admit: 2021-11-13 | Discharge: 2021-11-13 | Disposition: A | Discharge status: Home / Self Care | Payer: Medicaid Other | Source: Ambulatory Visit | Attending: Surgery | Admitting: Surgery

## 2021-11-13 DIAGNOSIS — Z01812 Encounter for preprocedural laboratory examination: Secondary | ICD-10-CM

## 2021-11-13 DIAGNOSIS — E1169 Type 2 diabetes mellitus with other specified complication: Secondary | ICD-10-CM

## 2021-11-13 HISTORY — DX: Thrombocytopenia, unspecified: D69.6

## 2021-11-13 HISTORY — DX: Depression, unspecified: F32.A

## 2021-11-13 HISTORY — DX: Type 2 diabetes mellitus without complications: E11.9

## 2021-11-13 NOTE — Patient Instructions (Addendum)
Your procedure is scheduled on: November 15, 2021 Report to the Registration Desk on the 1st floor of the [facility]. AT 10:20 AM FOR NUCLEAR MED If your arrival time is 6:00 am, do not arrive prior to that time as the [facility] entrance doors do not open until 6:00 am.  REMEMBER: Instructions that are not followed completely may result in serious medical risk, up to and including death; or upon the discretion of your surgeon and anesthesiologist your surgery may need to be rescheduled.  Do not eat food after midnight the night before surgery.  No gum chewing, lozengers or hard candies.  Do NOT drink anything that is not on this list.   TAKE THESE MEDICATIONS THE MORNING OF SURGERY WITH A SIP OF WATER: LETROZOLE   DO NOT TAKE METFORMIN FOR TWO DAYS BEFORE SURGERY NONE ON 8/16 AND 8/17 2023 DO NOT TAKE GLIPIZIDE THE DAY OF SURGERY.   One week prior to surgery: Stop Anti-inflammatories (NSAIDS) such as Advil, Aleve, Ibuprofen, Motrin, Naproxen, Naprosyn and Aspirin based products such as Excedrin, Goodys Powder, BC Powder. Stop ANY OVER THE COUNTER supplements until after surgery. You may however, continue to take Tylenol if needed for pain up until the day of surgery.  No Alcohol for 24 hours before or after surgery.  No Smoking including e-cigarettes for 24 hours prior to surgery.  No chewable tobacco products for at least 6 hours prior to surgery.  No nicotine patches on the day of surgery.  Do not use any "recreational" drugs for at least a week prior to your surgery.  Please be advised that the combination of cocaine and anesthesia may have negative outcomes, up to and including death. If you test positive for cocaine, your surgery will be cancelled.  On the morning of surgery brush your teeth with toothpaste and water, you may rinse your mouth with mouthwash if you wish. Do not swallow any toothpaste or mouthwash.  Use CHG Soap as directed on instruction sheet.  Do not  wear jewelry, make-up, hairpins, clips or nail polish.  Do not wear lotions, powders, or perfumes.   Do not shave body from the neck down 48 hours prior to surgery just in case you cut yourself which could leave a site for infection.  Also, freshly shaved skin may become irritated if using the CHG soap.  Contact lenses, hearing aids and dentures may not be worn into surgery.  Do not bring valuables to the hospital. [facility] is not responsible for any missing/lost belongings or valuables.   Notify your doctor if there is any change in your medical condition (cold, fever, infection).  Wear comfortable clothing (specific to your surgery type) to the hospital.  After surgery, you can help prevent lung complications by doing breathing exercises.  Take deep breaths and cough every 1-2 hours. Your doctor may order a device called an Incentive Spirometer to help you take deep breaths. When coughing or sneezing, hold a pillow firmly against your incision with both hands. This is called "splinting." Doing this helps protect your incision. It also decreases belly discomfort.  If you are being discharged the day of surgery, you will not be allowed to drive home. You will need a responsible adult (18 years or older) to drive you home and stay with you that night.   If you are taking public transportation, you will need to have a responsible adult (18 years or older) with you. Please confirm with your physician that it is acceptable  to use public transportation.   Please call the [facility]. at [phone] if you have any questions about these instructions.  Surgery Visitation Policy:  Patients undergoing a surgery or procedure may have two family members or support persons with them as long as the person is not COVID-19 positive or experiencing its symptoms.

## 2021-11-14 ENCOUNTER — Inpatient Hospital Stay: Payer: Medicaid Other

## 2021-11-14 MED ORDER — CEFAZOLIN SODIUM-DEXTROSE 2-4 GM/100ML-% IV SOLN
2.0000 g | INTRAVENOUS | Status: AC
  Administered 2021-11-15: 2 g via INTRAVENOUS

## 2021-11-14 MED ORDER — CHLORHEXIDINE GLUCONATE CLOTH 2 % EX PADS
6.0000 | MEDICATED_PAD | Freq: Once | CUTANEOUS | Status: AC
  Administered 2021-11-15: 6 via TOPICAL

## 2021-11-14 MED ORDER — SODIUM CHLORIDE 0.9 % IV SOLN: INTRAVENOUS | Status: DC

## 2021-11-14 MED ORDER — CHLORHEXIDINE GLUCONATE CLOTH 2 % EX PADS: 6.0000 | MEDICATED_PAD | Freq: Once | CUTANEOUS | Status: DC

## 2021-11-14 MED ORDER — BUPIVACAINE LIPOSOME 1.3 % IJ SUSP: 20.0000 mL | Freq: Once | INTRAMUSCULAR | Status: DC

## 2021-11-14 MED ORDER — GABAPENTIN 300 MG PO CAPS: 300.0000 mg | ORAL_CAPSULE | ORAL | Status: AC

## 2021-11-14 MED ORDER — FAMOTIDINE 20 MG PO TABS: 20.0000 mg | ORAL_TABLET | Freq: Once | ORAL | Status: AC

## 2021-11-14 NOTE — Progress Notes (Signed)
Nutrition Follow-up:  Patient with lobular breast cancer.  Planning to have surgery tomorrow 8/18 after delay due to elevated blood glucose.  Spoke with patient via phone.  Patient says that she is drinking diet drinks without sugar.  She is trying to stay away from concentrated sweets (cakes, cookies, candies, pies, ice cream, pudding).      Medications: reviewed  Labs: glucose 158  Anthropometrics:   Weight 175 lb 6.4 oz 8/10  181 lb 7/24 184 lb on 5/31   NUTRITION DIAGNOSIS: Food and nutrition related knowledge deficit ongoing with low literacy. Patient will need continued reminders    INTERVENTION:  Stressed importance of taking diabetes medication as prescribed to help control blood glucose Encouraged non sugary beverages Encouraged avoiding concentrated sweets.  Patient with difficulty understanding carbohydrate counting.      NEXT VISIT: no follow-up RD available if needed  Angelo Prindle B. Zenia Resides, [place], [place] Registered Dietitian [phone]

## 2021-11-15 ENCOUNTER — Ambulatory Visit
Admission: RE | Admit: 2021-11-15 | Discharge: 2021-11-15 | Disposition: A | Discharge status: Home / Self Care | Payer: Medicaid Other | Source: Ambulatory Visit | Attending: Surgery | Admitting: Surgery

## 2021-11-15 ENCOUNTER — Other Ambulatory Visit: Payer: Self-pay

## 2021-11-15 ENCOUNTER — Observation Stay
Admission: RE | Admit: 2021-11-15 | Discharge: 2021-11-17 | Disposition: A | Discharge status: Home / Self Care | Payer: Medicaid Other | Attending: Surgery | Admitting: Surgery

## 2021-11-15 ENCOUNTER — Ambulatory Visit: Payer: Medicaid Other | Admitting: Anesthesiology

## 2021-11-15 ENCOUNTER — Encounter: Payer: Self-pay | Admitting: Surgery

## 2021-11-15 ENCOUNTER — Encounter
Admission: RE | Disposition: A | Discharge status: Home / Self Care | Payer: Self-pay | Source: Home / Self Care | Attending: Surgery

## 2021-11-15 DIAGNOSIS — I10 Essential (primary) hypertension: Secondary | ICD-10-CM | POA: Diagnosis not present

## 2021-11-15 DIAGNOSIS — C50511 Malignant neoplasm of lower-outer quadrant of right female breast: Secondary | ICD-10-CM | POA: Diagnosis not present

## 2021-11-15 DIAGNOSIS — E119 Type 2 diabetes mellitus without complications: Secondary | ICD-10-CM | POA: Insufficient documentation

## 2021-11-15 DIAGNOSIS — Z170 Estrogen receptor positive status [ER+]: Secondary | ICD-10-CM

## 2021-11-15 DIAGNOSIS — C50919 Malignant neoplasm of unspecified site of unspecified female breast: Secondary | ICD-10-CM | POA: Diagnosis not present

## 2021-11-15 DIAGNOSIS — C50512 Malignant neoplasm of lower-outer quadrant of left female breast: Secondary | ICD-10-CM | POA: Insufficient documentation

## 2021-11-15 DIAGNOSIS — Z87891 Personal history of nicotine dependence: Secondary | ICD-10-CM | POA: Diagnosis not present

## 2021-11-15 DIAGNOSIS — C50911 Malignant neoplasm of unspecified site of right female breast: Secondary | ICD-10-CM | POA: Diagnosis present

## 2021-11-15 DIAGNOSIS — C50412 Malignant neoplasm of upper-outer quadrant of left female breast: Secondary | ICD-10-CM | POA: Diagnosis not present

## 2021-11-15 DIAGNOSIS — E1169 Type 2 diabetes mellitus with other specified complication: Secondary | ICD-10-CM

## 2021-11-15 DIAGNOSIS — Z7984 Long term (current) use of oral hypoglycemic drugs: Secondary | ICD-10-CM | POA: Insufficient documentation

## 2021-11-15 DIAGNOSIS — Z79899 Other long term (current) drug therapy: Secondary | ICD-10-CM | POA: Diagnosis not present

## 2021-11-15 HISTORY — PX: TOTAL MASTECTOMY: SHX6129

## 2021-11-15 HISTORY — PX: AXILLARY SENTINEL NODE BIOPSY: SHX5738

## 2021-11-15 HISTORY — PX: MASTECTOMY MODIFIED RADICAL: SHX5962

## 2021-11-15 LAB — GLUCOSE, CAPILLARY
Glucose-Capillary: 144 mg/dL — ABNORMAL HIGH (ref 70–99)
Glucose-Capillary: 204 mg/dL — ABNORMAL HIGH (ref 70–99)
Glucose-Capillary: 199 mg/dL — ABNORMAL HIGH (ref 70–99)
Glucose-Capillary: 211 mg/dL — ABNORMAL HIGH (ref 70–99)

## 2021-11-15 SURGERY — MASTECTOMY, MODIFIED RADICAL
Anesthesia: General | Laterality: Right

## 2021-11-15 MED ORDER — PROPOFOL 10 MG/ML IV BOLUS
INTRAVENOUS | Status: AC
  Filled 2021-11-15: qty 40

## 2021-11-15 MED ORDER — LACTATED RINGERS IV SOLN: INTRAVENOUS | Status: DC | PRN

## 2021-11-15 MED ORDER — ROCURONIUM BROMIDE 10 MG/ML (PF) SYRINGE
PREFILLED_SYRINGE | INTRAVENOUS | Status: AC
Start: 2021-11-15 — End: ?
  Filled 2021-11-15: qty 10

## 2021-11-15 MED ORDER — OXYCODONE HCL 5 MG/5ML PO SOLN: 5.0000 mg | Freq: Once | ORAL | Status: AC | PRN

## 2021-11-15 MED ORDER — PROPOFOL 10 MG/ML IV BOLUS
INTRAVENOUS | Status: DC | PRN
  Administered 2021-11-15: 200 mg via INTRAVENOUS

## 2021-11-15 MED ORDER — DEXMEDETOMIDINE (PRECEDEX) IN NS 20 MCG/5ML (4 MCG/ML) IV SYRINGE
PREFILLED_SYRINGE | INTRAVENOUS | Status: DC | PRN
  Administered 2021-11-15: 4 ug via INTRAVENOUS

## 2021-11-15 MED ORDER — DEXAMETHASONE SODIUM PHOSPHATE 10 MG/ML IJ SOLN
INTRAMUSCULAR | Status: DC | PRN
  Administered 2021-11-15: 5 mg via INTRAVENOUS

## 2021-11-15 MED ORDER — FENTANYL CITRATE (PF) 100 MCG/2ML IJ SOLN
INTRAMUSCULAR | Status: AC
  Filled 2021-11-15: qty 2

## 2021-11-15 MED ORDER — OXYCODONE HCL 5 MG PO TABS
5.0000 mg | ORAL_TABLET | Freq: Once | ORAL | Status: AC | PRN
  Administered 2021-11-15: 5 mg via ORAL

## 2021-11-15 MED ORDER — CHLORHEXIDINE GLUCONATE 0.12 % MT SOLN: 15.0000 mL | Freq: Once | OROMUCOSAL | Status: AC

## 2021-11-15 MED ORDER — HYDROMORPHONE HCL 1 MG/ML IJ SOLN
0.5000 mg | INTRAMUSCULAR | Status: DC | PRN
  Administered 2021-11-15 – 2021-11-16 (×2): 0.5 mg via INTRAVENOUS
  Filled 2021-11-15 (×2): qty 0.5

## 2021-11-15 MED ORDER — HYDRALAZINE HCL 20 MG/ML IJ SOLN: 10.0000 mg | INTRAMUSCULAR | Status: DC | PRN

## 2021-11-15 MED ORDER — GABAPENTIN 300 MG PO CAPS
ORAL_CAPSULE | ORAL | Status: AC
  Administered 2021-11-15: 300 mg via ORAL
  Filled 2021-11-15: qty 1

## 2021-11-15 MED ORDER — SODIUM CHLORIDE 0.9 % IV SOLN: INTRAVENOUS | Status: DC

## 2021-11-15 MED ORDER — ONDANSETRON HCL 4 MG/2ML IJ SOLN
INTRAMUSCULAR | Status: DC | PRN
  Administered 2021-11-15: 4 mg via INTRAVENOUS

## 2021-11-15 MED ORDER — CEFAZOLIN SODIUM-DEXTROSE 2-4 GM/100ML-% IV SOLN
INTRAVENOUS | Status: AC
  Filled 2021-11-15: qty 100

## 2021-11-15 MED ORDER — LETROZOLE 2.5 MG PO TABS
2.5000 mg | ORAL_TABLET | Freq: Every day | ORAL | Status: DC
  Administered 2021-11-16 – 2021-11-17 (×2): 2.5 mg via ORAL
  Filled 2021-11-15 (×2): qty 1

## 2021-11-15 MED ORDER — CALCIUM CARBONATE ANTACID 500 MG PO CHEW
1.0000 | CHEWABLE_TABLET | Freq: Every day | ORAL | Status: DC
  Administered 2021-11-16 – 2021-11-17 (×2): 200 mg via ORAL
  Filled 2021-11-15 (×2): qty 1

## 2021-11-15 MED ORDER — FENTANYL CITRATE (PF) 100 MCG/2ML IJ SOLN
25.0000 ug | INTRAMUSCULAR | Status: DC | PRN
  Administered 2021-11-15: 50 ug via INTRAVENOUS

## 2021-11-15 MED ORDER — OXYCODONE HCL 5 MG PO TABS
5.0000 mg | ORAL_TABLET | ORAL | Status: AC | PRN
  Administered 2021-11-16: 5 mg via ORAL
  Filled 2021-11-15: qty 1

## 2021-11-15 MED ORDER — OXYCODONE HCL 5 MG/5ML PO SOLN: 5.0000 mg | ORAL | Status: AC | PRN

## 2021-11-15 MED ORDER — STERILE WATER FOR IRRIGATION IR SOLN
Status: DC | PRN
  Administered 2021-11-15: 1500 mL

## 2021-11-15 MED ORDER — BUPIVACAINE LIPOSOME 1.3 % IJ SUSP
INTRAMUSCULAR | Status: AC
Start: 2021-11-15 — End: ?
  Filled 2021-11-15: qty 20

## 2021-11-15 MED ORDER — ROCURONIUM BROMIDE 100 MG/10ML IV SOLN
INTRAVENOUS | Status: DC | PRN
  Administered 2021-11-15: 40 mg via INTRAVENOUS
  Administered 2021-11-15: 20 mg via INTRAVENOUS

## 2021-11-15 MED ORDER — DEXAMETHASONE SODIUM PHOSPHATE 10 MG/ML IJ SOLN
INTRAMUSCULAR | Status: AC
  Filled 2021-11-15: qty 1

## 2021-11-15 MED ORDER — DIPHENHYDRAMINE HCL 50 MG/ML IJ SOLN
12.5000 mg | Freq: Four times a day (QID) | INTRAMUSCULAR | Status: DC | PRN
  Filled 2021-11-15: qty 1

## 2021-11-15 MED ORDER — ZOLPIDEM TARTRATE 5 MG PO TABS
5.0000 mg | ORAL_TABLET | Freq: Every evening | ORAL | Status: DC | PRN
  Filled 2021-11-15: qty 1

## 2021-11-15 MED ORDER — OXYCODONE HCL 5 MG PO TABS
ORAL_TABLET | ORAL | Status: AC
  Filled 2021-11-15: qty 1

## 2021-11-15 MED ORDER — FENTANYL CITRATE (PF) 100 MCG/2ML IJ SOLN
INTRAMUSCULAR | Status: AC
  Administered 2021-11-15: 50 ug via INTRAVENOUS
  Filled 2021-11-15: qty 2

## 2021-11-15 MED ORDER — ISOSULFAN BLUE 1 % ~~LOC~~ SOLN
SUBCUTANEOUS | Status: DC | PRN
  Administered 2021-11-15: 5 mg via SUBCUTANEOUS

## 2021-11-15 MED ORDER — DROPERIDOL 2.5 MG/ML IJ SOLN
0.6250 mg | Freq: Once | INTRAMUSCULAR | Status: AC
Start: 2021-11-15 — End: 2021-11-15

## 2021-11-15 MED ORDER — PANTOPRAZOLE SODIUM 40 MG IV SOLR
40.0000 mg | Freq: Every day | INTRAVENOUS | Status: DC
  Administered 2021-11-15 – 2021-11-16 (×2): 40 mg via INTRAVENOUS
  Filled 2021-11-15 (×2): qty 10

## 2021-11-15 MED ORDER — ONDANSETRON 4 MG PO TBDP: 4.0000 mg | ORAL_TABLET | Freq: Four times a day (QID) | ORAL | Status: DC | PRN

## 2021-11-15 MED ORDER — BUPIVACAINE LIPOSOME 1.3 % IJ SUSP
INTRAMUSCULAR | Status: DC | PRN
  Administered 2021-11-15: 20 mL

## 2021-11-15 MED ORDER — TECHNETIUM TC 99M TILMANOCEPT KIT
1.0000 | PACK | Freq: Once | INTRAVENOUS | Status: AC | PRN
  Administered 2021-11-15: 1.08 via INTRADERMAL

## 2021-11-15 MED ORDER — ONDANSETRON HCL 4 MG/2ML IJ SOLN: 4.0000 mg | Freq: Four times a day (QID) | INTRAMUSCULAR | Status: DC | PRN

## 2021-11-15 MED ORDER — CEFAZOLIN SODIUM-DEXTROSE 2-4 GM/100ML-% IV SOLN
2.0000 g | Freq: Three times a day (TID) | INTRAVENOUS | Status: AC
  Administered 2021-11-15: 2 g via INTRAVENOUS
  Filled 2021-11-15: qty 100

## 2021-11-15 MED ORDER — FAMOTIDINE 20 MG PO TABS
ORAL_TABLET | ORAL | Status: AC
  Administered 2021-11-15: 20 mg via ORAL
  Filled 2021-11-15: qty 1

## 2021-11-15 MED ORDER — FENTANYL CITRATE (PF) 100 MCG/2ML IJ SOLN
INTRAMUSCULAR | Status: DC | PRN
  Administered 2021-11-15: 50 ug via INTRAVENOUS
  Administered 2021-11-15: 25 ug via INTRAVENOUS
  Administered 2021-11-15: 50 ug via INTRAVENOUS
  Administered 2021-11-15: 25 ug via INTRAVENOUS

## 2021-11-15 MED ORDER — SUGAMMADEX SODIUM 200 MG/2ML IV SOLN
INTRAVENOUS | Status: DC | PRN
  Administered 2021-11-15: 200 mg via INTRAVENOUS

## 2021-11-15 MED ORDER — DIPHENHYDRAMINE HCL 12.5 MG/5ML PO ELIX: 12.5000 mg | ORAL_SOLUTION | Freq: Four times a day (QID) | ORAL | Status: DC | PRN

## 2021-11-15 MED ORDER — INSULIN ASPART 100 UNIT/ML IJ SOLN
0.0000 [IU] | Freq: Three times a day (TID) | INTRAMUSCULAR | Status: DC
  Administered 2021-11-16: 2 [IU] via SUBCUTANEOUS
  Administered 2021-11-16: 3 [IU] via SUBCUTANEOUS
  Administered 2021-11-17: 2 [IU] via SUBCUTANEOUS
  Filled 2021-11-15 (×4): qty 1

## 2021-11-15 MED ORDER — ACETAMINOPHEN 500 MG PO TABS: 500.0000 mg | ORAL_TABLET | Freq: Four times a day (QID) | ORAL | Status: DC | PRN

## 2021-11-15 MED ORDER — HYDROCODONE-ACETAMINOPHEN 5-325 MG PO TABS: 1.0000 | ORAL_TABLET | ORAL | Status: DC | PRN

## 2021-11-15 MED ORDER — ISOSULFAN BLUE 1 % ~~LOC~~ SOLN
SUBCUTANEOUS | Status: AC
  Filled 2021-11-15: qty 5

## 2021-11-15 MED ORDER — METFORMIN HCL 500 MG PO TABS
500.0000 mg | ORAL_TABLET | Freq: Every day | ORAL | Status: DC
  Administered 2021-11-16 – 2021-11-17 (×2): 500 mg via ORAL
  Filled 2021-11-15 (×2): qty 1

## 2021-11-15 MED ORDER — ORAL CARE MOUTH RINSE: 15.0000 mL | Freq: Once | OROMUCOSAL | Status: AC

## 2021-11-15 MED ORDER — ACETAMINOPHEN 500 MG PO TABS
ORAL_TABLET | ORAL | Status: AC
  Administered 2021-11-15: 500 mg
  Filled 2021-11-15: qty 2

## 2021-11-15 MED ORDER — DROPERIDOL 2.5 MG/ML IJ SOLN
INTRAMUSCULAR | Status: AC
  Administered 2021-11-15: 0.625 mg via INTRAVENOUS
  Filled 2021-11-15: qty 2

## 2021-11-15 MED ORDER — CHLORHEXIDINE GLUCONATE 0.12 % MT SOLN
OROMUCOSAL | Status: AC
  Administered 2021-11-15: 15 mL via OROMUCOSAL
  Filled 2021-11-15: qty 15

## 2021-11-15 MED ORDER — BUPIVACAINE HCL (PF) 0.5 % IJ SOLN
INTRAMUSCULAR | Status: AC
Start: 2021-11-15 — End: ?
  Filled 2021-11-15: qty 30

## 2021-11-15 MED ORDER — BUPIVACAINE HCL 0.5 % IJ SOLN
INTRAMUSCULAR | Status: DC | PRN
  Administered 2021-11-15: 30 mL via INTRAMUSCULAR

## 2021-11-15 MED ORDER — ONDANSETRON HCL 4 MG/2ML IJ SOLN
INTRAMUSCULAR | Status: AC
  Filled 2021-11-15: qty 2

## 2021-11-15 SURGICAL SUPPLY — 48 items
BINDER BREAST LRG (GAUZE/BANDAGES/DRESSINGS) IMPLANT
BLADE SURG SZ10 CARB STEEL (BLADE) ×4 IMPLANT
BULB RESERV EVAC DRAIN JP 100C (MISCELLANEOUS) IMPLANT
CHLORAPREP W/TINT 26 (MISCELLANEOUS) ×2 IMPLANT
CLIP TI MEDIUM 6 (CLIP) IMPLANT
COVER PROBE GAMMA FINDER SLV (MISCELLANEOUS) IMPLANT
DRAIN CHANNEL JP 15F RND 16 (MISCELLANEOUS) IMPLANT
DRAPE LAPAROTOMY TRNSV 106X77 (MISCELLANEOUS) ×2 IMPLANT
DRSG GAUZE FLUFF 36X18 (GAUZE/BANDAGES/DRESSINGS) ×2 IMPLANT
DRSG OPSITE POSTOP 4X12 (GAUZE/BANDAGES/DRESSINGS) IMPLANT
DRSG TELFA 3X8 NADH (GAUZE/BANDAGES/DRESSINGS) IMPLANT
ELECT CAUTERY BLADE 6.4 (BLADE) ×2 IMPLANT
ELECT CAUTERY BLADE TIP 2.5 (TIP) ×2
ELECT REM PT RETURN 9FT ADLT (ELECTROSURGICAL) ×2
ELECTRODE CAUTERY BLDE TIP 2.5 (TIP) ×2 IMPLANT
ELECTRODE REM PT RTRN 9FT ADLT (ELECTROSURGICAL) ×2 IMPLANT
GAUZE 4X4 16PLY ~~LOC~~+RFID DBL (SPONGE) ×2 IMPLANT
GLOVE ORTHO TXT STRL SZ7.5 (GLOVE) ×4 IMPLANT
GOWN STRL REUS W/ TWL LRG LVL3 (GOWN DISPOSABLE) ×2 IMPLANT
GOWN STRL REUS W/ TWL XL LVL3 (GOWN DISPOSABLE) ×2 IMPLANT
GOWN STRL REUS W/TWL LRG LVL3 (GOWN DISPOSABLE) ×4
GOWN STRL REUS W/TWL XL LVL3 (GOWN DISPOSABLE) ×2
HEMOSTAT ARISTA ABSORB 3G PWDR (HEMOSTASIS) IMPLANT
KIT TURNOVER KIT A (KITS) ×2 IMPLANT
MANIFOLD NEPTUNE II (INSTRUMENTS) ×2 IMPLANT
NEEDLE HYPO 22GX1.5 SAFETY (NEEDLE) ×2 IMPLANT
PACK BASIN MAJOR ARMC (MISCELLANEOUS) ×2 IMPLANT
PAD DRESSING TELFA 3X8 NADH (GAUZE/BANDAGES/DRESSINGS) ×2 IMPLANT
SPONGE T-LAP 18X18 ~~LOC~~+RFID (SPONGE) ×6 IMPLANT
STAPLER SKIN PROX 35W (STAPLE) IMPLANT
SUT ETHILON 3-0 FS-10 30 BLK (SUTURE) ×4
SUT MNCRL AB 4-0 PS2 18 (SUTURE) ×4 IMPLANT
SUT SILK 2 0 (SUTURE)
SUT SILK 2 0 SH (SUTURE) ×2 IMPLANT
SUT SILK 2-0 18XBRD TIE 12 (SUTURE) ×2 IMPLANT
SUT V-LOC 90 ABS DVC 3-0 CL (SUTURE) IMPLANT
SUT VIC AB 3-0 54X BRD REEL (SUTURE) ×2 IMPLANT
SUT VIC AB 3-0 BRD 54 (SUTURE) ×2
SUT VIC AB 3-0 SH 27 (SUTURE) ×4
SUT VIC AB 3-0 SH 27X BRD (SUTURE) ×4 IMPLANT
SUTURE EHLN 3-0 FS-10 30 BLK (SUTURE) ×2 IMPLANT
SWABSTK COMLB BENZOIN TINCTURE (MISCELLANEOUS) IMPLANT
SYR 10ML LL (SYRINGE) IMPLANT
SYR 20ML LL LF (SYRINGE) ×2 IMPLANT
SYR BULB IRRIG 60ML STRL (SYRINGE) ×2 IMPLANT
TRAP FLUID SMOKE EVACUATOR (MISCELLANEOUS) ×2 IMPLANT
WATER STERILE IRR 1000ML POUR (IV SOLUTION) ×2 IMPLANT
WATER STERILE IRR 500ML POUR (IV SOLUTION) ×2 IMPLANT

## 2021-11-15 NOTE — Interval H&P Note (Signed)
History and Physical Interval Note:  11/15/2021 10:45 AM  Sharon Woods  has presented today for surgery, with the diagnosis of breast cancer.  The various methods of treatment have been discussed with the patient and family. After consideration of risks, benefits and other options for treatment, the patient has consented to  Procedure(s): MASTECTOMY MODIFIED RADICAL (Right) TOTAL MASTECTOMY (Left) AXILLARY SENTINEL NODE BIOPSY (N/A) as a surgical intervention.  The patient's history has been reviewed, patient examined, no change in status, stable for surgery.  I have reviewed the patient's chart and labs.  Questions were answered to the patient's satisfaction.     [name]

## 2021-11-15 NOTE — Plan of Care (Signed)

## 2021-11-15 NOTE — Anesthesia Procedure Notes (Addendum)
Procedure Name: Intubation Date/Time: 11/15/2021 11:20 AM  Performed by: Louann Sjogren, CRNAPre-anesthesia Checklist: Patient identified, Patient being monitored, Timeout performed, Emergency Drugs available and Suction available Patient Re-evaluated:Patient Re-evaluated prior to induction Oxygen Delivery Method: Circle system utilized Preoxygenation: Pre-oxygenation with 100% oxygen Induction Type: IV induction Ventilation: Mask ventilation without difficulty Laryngoscope Size: 3 and McGraph Grade View: Grade I Tube type: Oral Tube size: 7.0 mm Number of attempts: 1 Airway Equipment and Method: Stylet Placement Confirmation: ETT inserted through vocal cords under direct vision, positive ETCO2 and breath sounds checked- equal and bilateral Secured at: 21 cm Tube secured with: Tape Dental Injury: Teeth and Oropharynx as per pre-operative assessment

## 2021-11-15 NOTE — Anesthesia Postprocedure Evaluation (Signed)
Anesthesia Post Note  Patient: Sharon Woods  Procedure(s) Performed: MASTECTOMY MODIFIED RADICAL (Right) TOTAL MASTECTOMY (Left) AXILLARY SENTINEL NODE BIOPSY (Left)  Patient location during evaluation: PACU Anesthesia Type: General Level of consciousness: awake and alert Pain management: pain level controlled Vital Signs Assessment: post-procedure vital signs reviewed and stable Respiratory status: spontaneous breathing, nonlabored ventilation, respiratory function stable and patient connected to nasal cannula oxygen Cardiovascular status: blood pressure returned to baseline and stable Postop Assessment: no apparent nausea or vomiting Anesthetic complications: no   No notable events documented.   Last Vitals:  Vitals:   11/15/21 1500 11/15/21 1515  BP: 130/72 (!) 158/91  Pulse: 67 73  Resp: 15 14  Temp:    SpO2: 100% 95%    Last Pain:  Vitals:   11/15/21 1515  TempSrc:   PainSc: [place]

## 2021-11-15 NOTE — Op Note (Signed)
SURGICAL OPERATIVE REPORT   DATE OF PROCEDURE: 11/15/2021  ATTENDING Surgeon(s): [name], MD  ANESTHESIA: General   PRE-OPERATIVE DIAGNOSIS: Bilateral breast core needle biopsy-proven invasive mammary carcinoma, right axillary core biopsy proven metastatic disease to lymph nodes.    POST-OPERATIVE DIAGNOSIS: Same.   PROCEDURE(S):  1.) Right modified radical mastectomy (cpt: 16109) 2.)  Simple left mastectomy [phone] with excisional biopsy of Left axillary deep sentinel lymph nodes using radiolabel/tracer and lymphazurine blue dye (cpt's: 09811 and 91478)   INTRAOPERATIVE FINDINGS: Gross lymphadenopathy right axilla, no residual palpable disease at end of procedure.     ESTIMATED BLOOD LOSS: 50 mL   URINE OUTPUT: No Foley    SPECIMENS: Right & Left breast mastectomy and Right axillary contents en bloc, Left deep axillary sentinel lymph node(s)   IMPLANTS: None   DRAINS: 15 F round drains 1 from lateral towards medial along inferior breast flap into axilla on left,  1 axillary and 1 inferior flap drain on the right.    COMPLICATIONS: None apparent   CONDITION AT END OF PROCEDURE: Hemodynamically stable and extubated   DISPOSITION OF PATIENT: PACU    DETAILS OF PROCEDURE: Patient was brought to the operating suite and appropriately identified. General anesthesia was administered along with appropriate pre-operative antibiotics, and endotracheal intubation was performed by anesthetist.  1 mL aliquots x 5 of lymphazurine blue dye were injected into the left peri-areola and massaged into the tissue x ~5 minutes.  In supine position, operative site was prepped and draped in the usual sterile fashion, and following a brief time out, Planned elliptical incision encompassing the nipple-areolar complex oriented transversely to encompass the tumor of the breast was likewise marked/noted.  On the left: Elliptical skin incision encompassing the nipple-areolar complex was made, as  noted above, using a #10 blade scalpel. Superior and then inferior skin flaps were separated from breast parenchyma in the avascular plane between subcutaneous tissue and breast parenchyma using electrocautery to maintain hemostasis. A long lateral suture and short superior suture were used to orient the specimen, which was then excised from the underlying pectoralis major fascia from medially to laterally, after which specimen was swung laterally and a lateral pedicle where breast parenchyma became axillary fat was identified, incised, and the specimen was handed off the field for pathology processing. Hemostasis was confirmed/achieved, and the mastectomy wound was copiously irrigated with warm sterile saline.  Through the same incision attention was then directed to the Left axilla. Using blunt dissection and selective electrocautery, a lymph node with peak radiotracer counts and a discrete focus of blue dye was identified, and excised with a radiotracer count of 14,000. A second specimen was similarly identified and excised with a radiotracer count of 12,000, after which there remained no further significant radiotracer counts >10% of the pre-excision maximum count, nor any further blue dye, in the remaining axillary bed.  Saline moistened wet sponges were placed in the wound bed after reevaluating for hemostasis.  MRM: Attention was then turned to the right side. Elliptical skin incision encompassing the nipple-areolar complex was made, as noted above, using a #10 blade scalpel. Superior and then inferior skin flaps were separated from breast parenchyma in the avascular plane between subcutaneous tissue and breast parenchyma using electrocautery to maintain hemostasis.   The axillary package was then defined laterally by identifying the latissimus as the lateral boundary.  Medially the clavipectoral fascia was opened and dissected bluntly from the medial chest wall, freeing the tissues from behind the  pectoralis minor.  The long thoracic nerve was identified, tested and preserved.  Dissection anteriorly and remaining inferior to the axillary vein was continued with caution utilizing the Harmonic Focus. The thoraco-dorsal neuro-vascular bundle was identified and preserved.  The intervening tissues were divided with the Harmonic Focus to the posterior limit of the subscapularis.  The contents were then taken distally preserving the above structures.  Hemostasis was maintained.  Irrigation with sterile water, and the surfaces in both wounds dusted with Arista.   Two 15 drains placed and secure with 3-0 Nylon.  The lateral bulb is "Axilla" and the medial bulb is "Front."  The dermis was approximated with a running V-Lock of 3-0 PDS.  I then returned to the left side: 15 Blake drain was placed, and secured with 3-0 nylon.  I oriented the tubing to loop from the axilla medially along the inferior flap. Again I confirmed adequate hemostasis, infiltrated local anesthesia, dusted the wound surfaces with Arista and reapproximated the skin with running V-Loc of 3-0 PDS. Staples were applied to the skin to assist with eversion. Honeycomb dressings applied bilaterally.  Drain sponges secured.  Patient was then safely able to be extubated, awakened, and transferred to PACU for post-operative monitoring and care.   Axillary Lymph Node Dissection Synoptic Operative Report  Operation performed with curative intent:Yes  Resection was performed within the boundaries of the axillary vein, chest wall (serratus anterior), and latissimus dorsi:Yes  Nerves identified and preserved during dissection (select all that apply):Long Thoracic nerve  Level III nodes were removed:No   Sentinel Node Biopsy Synoptic Operative Report  Operation performed with curative intent:Yes  Tracer(s) used to identify sentinel nodes in the upfront surgery (non-neoadjuvant) setting (select all that apply):Dye and Radioactive  Tracer  Tracer(s) used to identify sentinel nodes in the neoadjuvant setting (select all that apply):N/A  All nodes (colored or non-colored) present at the end of a dye-filled lymphatic channel were removed:Yes   All significantly radioactive nodes were removed:Yes  All palpable suspicious nodes were removed:N/A  Biopsy-proven positive nodes marked with clips prior to chemotherapy were identified and removed:N/A   I was present for all aspects of the above procedure, and no operative complications were apparent.

## 2021-11-15 NOTE — Transfer of Care (Signed)
Immediate Anesthesia Transfer of Care Note  Patient: Sharon Woods  Procedure(s) Performed: MASTECTOMY MODIFIED RADICAL (Right) TOTAL MASTECTOMY (Left) AXILLARY SENTINEL NODE BIOPSY (Left)  Patient Location: PACU  Anesthesia Type:General  Level of Consciousness: awake, alert  and patient cooperative  Airway & Oxygen Therapy: Patient Spontanous Breathing and Patient connected to face mask oxygen  Post-op Assessment: Report given to RN and Post -op Vital signs reviewed and stable  Post vital signs: Reviewed and stable  Last Vitals:  Vitals Value Taken Time  BP 138/70 11/15/21 1453  Temp 36.3 C 11/15/21 1453  Pulse 70 11/15/21 1456  Resp 15 11/15/21 1456  SpO2 100 % 11/15/21 1456  Vitals shown include unvalidated device data.  Last Pain:  Vitals:   11/15/21 1453  TempSrc:   PainSc: Asleep      Patients Stated Pain Goal: 0 (71/24/58 0998)  Complications: No notable events documented.

## 2021-11-15 NOTE — Anesthesia Preprocedure Evaluation (Signed)
Anesthesia Evaluation  Patient identified by MRN, date of birth, ID band Patient awake    Reviewed: Allergy & Precautions, NPO status , Patient's Chart, lab work & pertinent test results  History of Anesthesia Complications (+) Emergence Delirium and history of anesthetic complications  Airway Mallampati: III  TM Distance: <3 FB Neck ROM: full    Dental  (+) Chipped, Poor Dentition, Missing   Pulmonary neg shortness of breath, COPD, former smoker,    Pulmonary exam normal        Cardiovascular Exercise Tolerance: Good hypertension, (-) angina(-) Past MI Normal cardiovascular exam     Neuro/Psych PSYCHIATRIC DISORDERS negative neurological ROS     GI/Hepatic negative GI ROS, Neg liver ROS, neg GERD  ,  Endo/Other  diabetes, Type 2, Oral Hypoglycemic Agents  Renal/GU      Musculoskeletal   Abdominal   Peds  Hematology negative hematology ROS (+)   Anesthesia Other Findings Past Medical History: No date: Anxiety No date: Depression No date: Diabetes mellitus without complication ([place]) 1287: History of high cholesterol No date: Hypertension No date: Patient denies medical problems No date: Thrombocytopenia ([place])  Past Surgical History: No date: ABDOMINAL HYSTERECTOMY 08/21/2021: BREAST BIOPSY; Right     Comment:  [place] bx 7:00 mass coil clip path pending 08/21/2021: BREAST BIOPSY; Right     Comment:  [place] bx of LN, hydro marker, path pending 08/21/2021: BREAST BIOPSY; Left     Comment:  [place] bx, heart marker, path pending No date: CESAREAN SECTION     Comment:  x2 No date: NO PAST SURGERIES     Reproductive/Obstetrics negative OB ROS                             Anesthesia Physical Anesthesia Plan  ASA: 3  Anesthesia Plan: General ETT   Post-op Pain Management:    Induction: Intravenous  PONV Risk Score and Plan: Ondansetron, Dexamethasone, Midazolam and Treatment may vary due  to age or medical condition  Airway Management Planned: Oral ETT  Additional Equipment:   Intra-op Plan:   Post-operative Plan: Extubation in OR  Informed Consent: I have reviewed the patients History and Physical, chart, labs and discussed the procedure including the risks, benefits and alternatives for the proposed anesthesia with the patient or authorized representative who has indicated his/her understanding and acceptance.     Dental Advisory Given  Plan Discussed with: Anesthesiologist, CRNA and Surgeon  Anesthesia Plan Comments: (Patient consented for risks of anesthesia including but not limited to:  - adverse reactions to medications - damage to eyes, teeth, lips or other oral mucosa - nerve damage due to positioning  - sore throat or hoarseness - Damage to heart, brain, nerves, lungs, other parts of body or loss of life  Patient voiced understanding.)        Anesthesia Quick Evaluation

## 2021-11-16 ENCOUNTER — Encounter: Payer: Self-pay | Admitting: Surgery

## 2021-11-16 DIAGNOSIS — C50912 Malignant neoplasm of unspecified site of left female breast: Secondary | ICD-10-CM | POA: Diagnosis not present

## 2021-11-16 DIAGNOSIS — C50511 Malignant neoplasm of lower-outer quadrant of right female breast: Secondary | ICD-10-CM | POA: Diagnosis not present

## 2021-11-16 DIAGNOSIS — E119 Type 2 diabetes mellitus without complications: Secondary | ICD-10-CM | POA: Diagnosis not present

## 2021-11-16 DIAGNOSIS — Z87891 Personal history of nicotine dependence: Secondary | ICD-10-CM | POA: Diagnosis not present

## 2021-11-16 DIAGNOSIS — Z79899 Other long term (current) drug therapy: Secondary | ICD-10-CM | POA: Diagnosis not present

## 2021-11-16 DIAGNOSIS — C50911 Malignant neoplasm of unspecified site of right female breast: Secondary | ICD-10-CM | POA: Diagnosis not present

## 2021-11-16 DIAGNOSIS — I10 Essential (primary) hypertension: Secondary | ICD-10-CM | POA: Diagnosis not present

## 2021-11-16 DIAGNOSIS — Z7984 Long term (current) use of oral hypoglycemic drugs: Secondary | ICD-10-CM | POA: Diagnosis not present

## 2021-11-16 DIAGNOSIS — C50512 Malignant neoplasm of lower-outer quadrant of left female breast: Secondary | ICD-10-CM | POA: Diagnosis not present

## 2021-11-16 LAB — GLUCOSE, CAPILLARY
Glucose-Capillary: 109 mg/dL — ABNORMAL HIGH (ref 70–99)
Glucose-Capillary: 165 mg/dL — ABNORMAL HIGH (ref 70–99)
Glucose-Capillary: 148 mg/dL — ABNORMAL HIGH (ref 70–99)
Glucose-Capillary: 145 mg/dL — ABNORMAL HIGH (ref 70–99)

## 2021-11-16 LAB — HEMOGLOBIN AND HEMATOCRIT, BLOOD
HCT: 35 % — ABNORMAL LOW (ref 36.0–46.0)
Hemoglobin: 11.5 g/dL — ABNORMAL LOW (ref 12.0–15.0)

## 2021-11-16 MED ORDER — OXYCODONE HCL 5 MG PO TABS
5.0000 mg | ORAL_TABLET | Freq: Four times a day (QID) | ORAL | Status: DC | PRN
  Administered 2021-11-17: 5 mg via ORAL
  Filled 2021-11-16: qty 1

## 2021-11-16 MED ORDER — TRAMADOL HCL 50 MG PO TABS
50.0000 mg | ORAL_TABLET | Freq: Four times a day (QID) | ORAL | Status: DC | PRN
  Administered 2021-11-16 – 2021-11-17 (×2): 50 mg via ORAL
  Filled 2021-11-16 (×2): qty 1

## 2021-11-16 NOTE — Progress Notes (Signed)
Mobility Specialist - Progress Note   Pre-mobility: HR, BP, SpO2 During mobility: HR, BP, SpO2 Post-mobility: HR, BP, SPO2     11/16/21 1541  Mobility  Activity Ambulated independently in hallway  Level of Assistance Independent  Assistive Device None  Distance Ambulated (ft) 160 ft  Activity Response Tolerated well  $Mobility charge 1 Mobility   Pt standing in doorway upon arrival. Pt utilizing RA. Pt ambulated two laps around NS. Pt left standing at NS (talking with RN). No complaints.   [place]ie [place] Mobility Specialist 11/16/21 3:43 PM

## 2021-11-16 NOTE — Progress Notes (Addendum)
Subjective:  CC: Sharon Woods is a 61 y.o. female  Hospital stay day 0, 1 Day Post-Op double mastectomy for breast cancer, right axillary dissection, left sentinel lymph node biopsy HPI: No acute issues overnight.  States she is sore around incision sites  ROS:  General: Denies weight loss, weight gain, fatigue, fevers, chills, and night sweats. Heart: Denies chest pain, palpitations, racing heart, irregular heartbeat, leg pain or swelling, and decreased activity tolerance. Respiratory: Denies breathing difficulty, shortness of breath, wheezing, cough, and sputum. GI: Denies change in appetite, heartburn, nausea, vomiting, constipation, diarrhea, and blood in stool. GU: Denies difficulty urinating, pain with urinating, urgency, frequency, blood in urine.   Objective:   Temp:  [96.8 F (36 C)-98.7 F (37.1 C)] 98.5 F (36.9 C) (08/19 0848) Pulse Rate:  [67-85] 85 (08/19 0848) Resp:  [14-24] 18 (08/19 0407) BP: (99-184)/(67-94) 122/67 (08/19 0848) SpO2:  [92 %-100 %] 96 % (08/19 0848) FiO2 (%):  [21 %] 21 % (08/19 0848)     Height: '5\' 1"'$  (154.9 cm) Weight: 79.6 kg BMI (Calculated): 33.17   Intake/Output this shift:   Intake/Output Summary (Last 24 hours) at 11/16/2021 1024 Last data filed at 11/16/2021 0930 Gross per 24 hour  Intake 2001.53 ml  Output 600 ml  Net 1401.53 ml    Constitutional :  alert, cooperative, appears stated age, and no distress  Respiratory:  clear to auscultation bilaterally  Cardiovascular:  regular rate and rhythm     Skin: Cool and moist.  Incisions clean dry and intact.  2 JP drains on the right side showing sanguinous discharge.  Left-sided noted to have dark old bloody discharge.  Total output from all 4 drains is 450 for the past 24 hours.  Psychiatric: Normal affect, non-agitated, not confused       LABS:     Latest Ref Rng & Units 11/04/2021    1:26 PM 10/21/2021   10:25 AM 10/08/2021   12:01 PM  CMP  Glucose 70 - 99 mg/dL 158  176  159    BUN 6 - 20 mg/dL '14  15  12   '$ Creatinine 0.44 - 1.00 mg/dL 0.70  0.60  0.58   Sodium 135 - 145 mmol/L 140  137  138   Potassium 3.5 - 5.1 mmol/L 3.6  3.8  3.4   Chloride 98 - 111 mmol/L 106  109  107   CO2 22 - 32 mmol/L '26  24  23   '$ Calcium 8.9 - 10.3 mg/dL 9.3  8.8  8.9   Total Protein 6.5 - 8.1 g/dL  7.5  7.6   Total Bilirubin 0.3 - 1.2 mg/dL  0.5  1.0   Alkaline Phos 38 - 126 U/L  62  68   AST 15 - 41 U/L  26  31   ALT 0 - 44 U/L  24  25       Latest Ref Rng & Units 10/21/2021   10:25 AM 10/08/2021   12:01 PM 08/28/2021   11:43 AM  CBC  WBC 4.0 - 10.5 K/uL 4.2  4.2  4.7   Hemoglobin 12.0 - 15.0 g/dL 13.2  14.0  14.5   Hematocrit 36.0 - 46.0 % 40.4  41.3  43.3   Platelets 150 - 400 K/uL 81  86  94     RADS: N/a Assessment:   Status post double mastectomy for breast cancer, right axillary dissection, left sentinel lymph node biopsy.  Pain control, tolerating diet, but output from  drains is moderate, and more sanguinous and acute looking on the right side.  We will obtain a hemoglobin level as baseline monitoring.  Due to patient concerns about drain care, will continue to monitor for another day and provide reeducation in anticipation of hopefully discharge tomorrow as long as hemoglobin remains stable and drain output decreases.  Hold DVT prophylaxis due to concern of bleeding  labs/images/medications/previous chart entries reviewed personally and relevant changes/updates noted above.

## 2021-11-16 NOTE — Progress Notes (Signed)
Patient re-educated on emptying the drains, milking the tubes, and charging the drains. Patient is forgetful and unsure about the drains.

## 2021-11-17 DIAGNOSIS — E119 Type 2 diabetes mellitus without complications: Secondary | ICD-10-CM | POA: Diagnosis not present

## 2021-11-17 DIAGNOSIS — C50912 Malignant neoplasm of unspecified site of left female breast: Secondary | ICD-10-CM | POA: Diagnosis not present

## 2021-11-17 DIAGNOSIS — C50911 Malignant neoplasm of unspecified site of right female breast: Secondary | ICD-10-CM | POA: Diagnosis not present

## 2021-11-17 DIAGNOSIS — I10 Essential (primary) hypertension: Secondary | ICD-10-CM | POA: Diagnosis not present

## 2021-11-17 DIAGNOSIS — Z7984 Long term (current) use of oral hypoglycemic drugs: Secondary | ICD-10-CM | POA: Diagnosis not present

## 2021-11-17 DIAGNOSIS — C50511 Malignant neoplasm of lower-outer quadrant of right female breast: Secondary | ICD-10-CM | POA: Diagnosis not present

## 2021-11-17 DIAGNOSIS — Z87891 Personal history of nicotine dependence: Secondary | ICD-10-CM | POA: Diagnosis not present

## 2021-11-17 DIAGNOSIS — Z79899 Other long term (current) drug therapy: Secondary | ICD-10-CM | POA: Diagnosis not present

## 2021-11-17 DIAGNOSIS — C50512 Malignant neoplasm of lower-outer quadrant of left female breast: Secondary | ICD-10-CM | POA: Diagnosis not present

## 2021-11-17 LAB — HEMOGLOBIN AND HEMATOCRIT, BLOOD
HCT: 36.8 % (ref 36.0–46.0)
Hemoglobin: 11.9 g/dL — ABNORMAL LOW (ref 12.0–15.0)

## 2021-11-17 LAB — GLUCOSE, CAPILLARY: Glucose-Capillary: 124 mg/dL — ABNORMAL HIGH (ref 70–99)

## 2021-11-17 MED ORDER — DOCUSATE SODIUM 100 MG PO CAPS: 100.0000 mg | ORAL_CAPSULE | Freq: Two times a day (BID) | ORAL | 0 refills | Status: AC | PRN

## 2021-11-17 MED ORDER — OXYCODONE HCL 5 MG PO TABS: 5.0000 mg | ORAL_TABLET | Freq: Four times a day (QID) | ORAL | 0 refills | Status: DC | PRN

## 2021-11-17 NOTE — Plan of Care (Signed)
  Problem: Skin Integrity: Goal: Risk for impaired skin integrity will decrease Outcome: Not Progressing   Problem: Pain Managment: Goal: General experience of comfort will improve Outcome: Not Progressing   Problem: Clinical Measurements: Goal: Will remain free from infection Outcome: Not Progressing

## 2021-11-17 NOTE — TOC Initial Note (Signed)
Transition of Care [facility]) - Initial/Assessment Note    Patient Details  Name: ALLEAN MONTFORT MRN: [id number] Date of Birth: 07/27/1960  Transition of Care [facility]) CM/SW Contact:    Beverly Sessions, RN Phone Number: 11/17/2021, 8:55 AM  Clinical Narrative:                   Transition of Care [facility]) Screening Note   Patient Details  Name: LEWIS GRIVAS Date of Birth: 08/06/1960   Transition of Care [facility]) CM/SW Contact:    Beverly Sessions, RN Phone Number: 11/17/2021, 8:56 AM    Transition of Care Department [facility]) has reviewed patient and no TOC needs have been identified at this time. We will continue to monitor patient advancement through interdisciplinary progression rounds. If new patient transition needs arise, please place a TOC consult.   Bedside RN to provide drain education prior to discharge    Patient Goals and CMS Choice        Expected Discharge Plan and Services           Expected Discharge Date: 11/17/21                                    Prior Living Arrangements/Services                       Activities of Daily Living Home Assistive Devices/Equipment: None ADL Screening (condition at time of admission) Patient's cognitive ability adequate to safely complete daily activities?: Yes Is the patient deaf or have difficulty hearing?: No Does the patient have difficulty seeing, even when wearing glasses/contacts?: No Does the patient have difficulty concentrating, remembering, or making decisions?: No Patient able to express need for assistance with ADLs?: Yes Does the patient have difficulty dressing or bathing?: No Independently performs ADLs?: Yes (appropriate for developmental age) Does the patient have difficulty walking or climbing stairs?: No Weakness of Legs: None Weakness of Arms/Hands: None  Permission Sought/Granted                  Emotional Assessment              Admission diagnosis:  Bilateral  breast cancer ([place]) [C50.911, C50.912] Patient Active Problem List   Diagnosis Date Noted   Bilateral breast cancer ([place]) 11/15/2021   Diabetes mellitus ([place]) 10/08/2021   Genetic testing 10/07/2021   Diabetes ([place]) 09/28/2021   Vaginitis 09/28/2021   Thrombocytopenia ([place]) 09/08/2021   Malignant neoplasm of left breast ([place]) 08/28/2021   Malignant neoplasm of lower-outer quadrant of right breast of female, estrogen receptor positive ([place]) 08/28/2021   Goals of care, counseling/discussion 08/28/2021   Severe recurrent major depression with psychotic features ([place]) 11/15/2015   Hypertension 11/15/2015   Noncompliance 11/15/2015   Severe major depression, single episode, with psychotic features, mood-congruent ([place]) 03/29/2015   Protein-calorie malnutrition, severe 03/26/2015   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   PCP:  Patient, No Pcp Per Pharmacy:   [facility] [address]), [place] [address] [place]) [place] [address] Phone: [phone] Fax: [place], [place]. [place] [address] Phone: [phone] Fax: [phone]     Social Determinants of Health (SDOH) Interventions    Readmission Risk Interventions     No data to display

## 2021-11-17 NOTE — Discharge Instructions (Signed)
BREAST SURGERY, Care After This sheet gives you information about how to care for yourself after your procedure. Your doctor may also give you more specific instructions. If you have problems or questions, contact your doctor. Follow these instructions at home: Care for cuts from surgery (incisions)    Follow instructions from your doctor about how to take care of your cuts from surgery. Make sure you: Wash your hands with soap and water before you change your bandage (dressing). If you cannot use soap and water, use hand sanitizer. Change your bandage as told by your doctor. Leave stitches (sutures), skin glue, or skin tape (adhesive) strips in place. They may need to stay in place for 2 weeks or longer. If tape strips get loose and curl up, you may trim the loose edges. Do not remove tape strips completely unless your doctor says it is okay. Do not take baths, swim, or use a hot tub until your doctor says it is okay. OK TO SHOWER IN 24HRS FROM DATE OF SURGERY.  Check your surgical cut area every day for signs of infection. Check for: More redness, swelling, or pain. More fluid or blood. Warmth. Pus or a bad smell. Activity Do not drive or use heavy machinery while taking prescription pain medicine. Do not play contact sports until your doctor says it is okay. Do not drive for 24 hours if you were given a medicine to help you relax (sedative). Rest as needed. Do not return to work or school until your doctor says it is okay. Wear supportive bras as tolerated to minimize discomfort General instructions  tylenol and advil as needed for discomfort.  Please alternate between the two every four hours as needed for pain.    Use narcotics, if prescribed, only when tylenol and motrin is not enough to control pain.  325-'650mg'$  every 8hrs to max of '4000mg'$ /24hrs (including the '325mg'$  in every norco dose) for the tylenol.    Advil up to '800mg'$  per dose every 8hrs as needed for pain.   To prevent or  treat constipation while you are taking prescription pain medicine, your doctor may recommend that you: Drink enough fluid to keep your pee (urine) clear or pale yellow. Take over-the-counter or prescription medicines. Eat foods that are high in fiber, such as fresh fruits and vegetables, whole grains, and beans. Limit foods that are high in fat and processed sugars, such as fried and sweet foods. Contact a doctor if: You develop a rash. You have more redness, swelling, or pain around your surgical cuts. You have more fluid or blood coming from your surgical cuts. Your surgical cuts feel warm to the touch. You have pus or a bad smell coming from your surgical cuts. You have a fever. One or more of your surgical cuts breaks open. You have trouble breathing. You have chest pain. You have pain that is getting worse in your shoulders. You faint or feel dizzy when you stand. You have very bad pain in your belly (abdomen). You are sick to your stomach (nauseous) for more than one day. You have throwing up (vomiting) that lasts for more than one day. You have leg pain. This information is not intended to replace advice given to you by your health care provider. Make sure you discuss any questions you have with your health care provider.

## 2021-11-19 NOTE — Discharge Summary (Signed)
Physician Discharge Summary  Patient ID: HEAVEN MEEKER MRN: [id number] DOB/AGE: 1960/07/20 61 y.o.  Admit date: 11/15/2021 Discharge date: 11/17/21  Admission Diagnoses: bilateral breast CA  Discharge Diagnoses:  Same as above  Discharged Condition: good  Hospital Course: admitted for above. Underwent surgery.  Please see op note for details.  Post op, some moderate sanguinous discharge from drain and patient anxiety regarding drain care, so stayed extra day to ensure hgb level remained stable and patient received extra education and reassurance regarding drain care.   At time of d/c, tolerating diet, pain controlled  Consults: None  Discharge Exam: Blood pressure (!) 155/74, pulse 74, temperature 98.1 F (36.7 C), resp. rate 18, height 5' 1"  (1.549 m), weight 79.6 kg, SpO2 100 %. General appearance: alert, cooperative, and no distress Breasts: incisions site dressing c/d/I.  JP with serosanguinous discharge  Disposition:  Discharge disposition: 01-Home or Self Care       Discharge Instructions     Discharge patient   Complete by: As directed    Discharge disposition: 01-Home or Self Care   Discharge patient date: 11/17/2021      Allergies as of 11/17/2021       Reactions   Tylenol [acetaminophen]    Per pt due to cirrhosis of the liver   Aleve [naproxen] Rash        Medication List     STOP taking these medications    acetaminophen 500 MG tablet Commonly known as: TYLENOL       TAKE these medications    Accu-Chek Guide test strip Generic drug: glucose blood USE TO CHECK BLOOD SUGAR UP TO 4 TIMES DAILY AS DIRECTED   Accu-Chek Softclix Lancets lancets SMARTSIG:Topical 1-4 Times Daily   blood glucose meter kit and supplies Kit Dispense based on patient and insurance preference. Use up to four times daily as directed.   calcium carbonate 500 MG chewable tablet Commonly known as: TUMS - dosed in mg elemental calcium Chew 1 tablet by mouth daily.    docusate sodium 100 MG capsule Commonly known as: Colace Take 1 capsule (100 mg total) by mouth 2 (two) times daily as needed for up to 10 days for mild constipation.   glipiZIDE 5 MG tablet Commonly known as: Glucotrol Take 1 tablet (5 mg total) by mouth daily before breakfast.   letrozole 2.5 MG tablet Commonly known as: FEMARA Take 1 tablet by mouth once daily   metFORMIN 500 MG tablet Commonly known as: GLUCOPHAGE Take 1 tablet (500 mg total) by mouth daily.   oxyCODONE 5 MG immediate release tablet Commonly known as: Oxy IR/ROXICODONE Take 1 tablet (5 mg total) by mouth every 6 (six) hours as needed for up to 10 doses for severe pain.   triamcinolone cream 0.1 % Commonly known as: KENALOG Apply 1 Application topically 2 (two) times daily.        Follow-up Information     [name], MD Follow up on 11/21/2021.   Specialty: General Surgery Contact information: [address] [place] [address] [phone]                  Total time spent arranging discharge was >48mn. Signed: IBenjamine Sprague8/22/2023, 9:08 AM

## 2021-11-20 ENCOUNTER — Other Ambulatory Visit: Payer: Self-pay | Admitting: Anatomic Pathology & Clinical Pathology

## 2021-11-20 LAB — SURGICAL PATHOLOGY

## 2021-11-21 ENCOUNTER — Ambulatory Visit (INDEPENDENT_AMBULATORY_CARE_PROVIDER_SITE_OTHER): Payer: Medicaid Other | Admitting: Surgery

## 2021-11-21 ENCOUNTER — Other Ambulatory Visit: Payer: Self-pay

## 2021-11-21 ENCOUNTER — Encounter: Payer: Self-pay | Admitting: *Deleted

## 2021-11-21 ENCOUNTER — Encounter: Payer: Self-pay | Admitting: Surgery

## 2021-11-21 ENCOUNTER — Other Ambulatory Visit: Payer: Self-pay | Admitting: Oncology

## 2021-11-21 VITALS — BP 130/83 | HR 72 | Temp 98.2°F | Ht 61.0 in | Wt 172.6 lb

## 2021-11-21 DIAGNOSIS — C50511 Malignant neoplasm of lower-outer quadrant of right female breast: Secondary | ICD-10-CM

## 2021-11-21 DIAGNOSIS — C50412 Malignant neoplasm of upper-outer quadrant of left female breast: Secondary | ICD-10-CM

## 2021-11-21 DIAGNOSIS — Z170 Estrogen receptor positive status [ER+]: Secondary | ICD-10-CM

## 2021-11-21 DIAGNOSIS — Z9013 Acquired absence of bilateral breasts and nipples: Secondary | ICD-10-CM | POA: Insufficient documentation

## 2021-11-21 DIAGNOSIS — Z08 Encounter for follow-up examination after completed treatment for malignant neoplasm: Secondary | ICD-10-CM

## 2021-11-21 NOTE — Patient Instructions (Signed)
We removed staples today We placed steri strips. These will begin to fall off within 7-10 days.   We removed one of the drains. You will need to keep the gauze dressing over the area for the next three days.

## 2021-11-21 NOTE — Progress Notes (Signed)
[facility] POST-OP OFFICE VISIT  11/21/2021  HPI: Sharon Woods is a 61 y.o. female 6 days s/p right modified radical mastectomy, left total mastectomy with sentinel lymph node biopsy.  She reports the anticipated underarm discomfort, denying fevers or chills.  They present with a record of the drainage output, confirming progress.   Vital signs: BP 130/83   Pulse 72   Temp 98.2 F (36.8 C) (Oral)   Ht '5\' 1"'$  (1.549 m)   Wt 172 lb 9.6 oz (78.3 kg)   LMP  (LMP Unknown) Comment: Patient is not a good historian  SpO2 99%   BMI 32.61 kg/m    Physical Exam: Constitutional: She appears at her baseline. Postmastectomy flaps appear without erythema, minimal traumatic type of redness bruising along the perimeter of the incision.  No underlying seroma or hematoma appreciated on either chest wall.  Drains present, right axillary is primarily serous, right anterior minimal serosanguineous.  Left is serosanguineous. Skin: No evidence of erythema, discharge or necrosis.  I am aware of the pathology report, but considering the patient's mental status, did not review it today with her husband present.  We will discuss it further on her next visit.  Questions were never raised regarding it.  Assessment/Plan: This is a 61 y.o. female 6 days s/p bilateral mastectomies, for significantly advanced disease bilaterally.  Patient Active Problem List   Diagnosis Date Noted   Bilateral breast cancer ([place]) 11/15/2021   Diabetes mellitus ([place]) 10/08/2021   Genetic testing 10/07/2021   Diabetes ([place]) 09/28/2021   Vaginitis 09/28/2021   Thrombocytopenia ([place]) 09/08/2021   Malignant neoplasm of left breast ([place]) 08/28/2021   Malignant neoplasm of lower-outer quadrant of right breast of female, estrogen receptor positive ([place]) 08/28/2021   Goals of care, counseling/discussion 08/28/2021   Severe recurrent major depression with psychotic features ([place]) 11/15/2015   Hypertension 11/15/2015    Noncompliance 11/15/2015   Severe major depression, single episode, with psychotic features, mood-congruent ([place]) 03/29/2015   Protein-calorie malnutrition, severe 03/26/2015   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    -   We proceeded with removal of the right anterior drain.  Remove the staples and replaced with Steri-Strips over benzoin.  Anticipate following the remaining drains over the next week.  Continue taking and recording JP output. I do not anticipate any further surgical intervention based on sentinel node positivity on the left considering the degree of disease she has on the right.   [name] M.D., FACS 11/21/2021, 4:31 PM

## 2021-11-21 NOTE — Progress Notes (Signed)
Called patient to check on her after surgery.   She is doing well.  She complains of a rash and itching.  She is still having some pain that she is taking her pain medication for.  She follows up with Dr. Christian Mate today and will discuss the rash and itching.   I have asked her to bring her medicaid to the cancer center so we can make a copy and put it in her chart.

## 2021-11-22 ENCOUNTER — Encounter: Payer: Self-pay | Admitting: Internal Medicine

## 2021-11-22 NOTE — Progress Notes (Signed)
Patient called regarding narcotic pain medication after her postmastectomy surgery.  Recommend calling surgeons office for prescription for  pain medication.  GB

## 2021-11-24 ENCOUNTER — Encounter: Payer: Self-pay | Admitting: Internal Medicine

## 2021-11-24 NOTE — Progress Notes (Signed)
Recommend calling surgeon's office regarding narcotic prescription since patient is recent mastectomy.  GB

## 2021-11-25 ENCOUNTER — Telehealth: Payer: Self-pay | Admitting: Surgery

## 2021-11-25 ENCOUNTER — Telehealth: Payer: Self-pay

## 2021-11-25 ENCOUNTER — Encounter: Payer: Self-pay | Admitting: *Deleted

## 2021-11-25 ENCOUNTER — Other Ambulatory Visit: Payer: Self-pay | Admitting: Surgery

## 2021-11-25 DIAGNOSIS — C50919 Malignant neoplasm of unspecified site of unspecified female breast: Secondary | ICD-10-CM

## 2021-11-25 MED ORDER — OXYCODONE HCL 5 MG PO TABS: 5.0000 mg | ORAL_TABLET | Freq: Four times a day (QID) | ORAL | 0 refills | Status: DC | PRN

## 2021-11-25 NOTE — Telephone Encounter (Signed)
Once auth is obtained please schedule PET , with MD a few days after PET and inform pt of appt.

## 2021-11-25 NOTE — Progress Notes (Signed)
Dr. Tasia Catchings ordered a PET scan, Sharon Woods is submitting prior auth.   I called Sharon Woods and told her Dr. Tasia Catchings is ordering the scan and we will be calling her with the appointment.  She is still having some pain from her bil mastectomy and asked about pain medication, she only has 1 pill left, I told her to discuss with Dr. Christian Mate and gave the number to their office.

## 2021-11-25 NOTE — Progress Notes (Signed)
Refill post-op pain medications, bilateral mastectomies for advanced disease.

## 2021-11-25 NOTE — Telephone Encounter (Signed)
-----   Message from Earlie Server, MD sent at 11/25/2021 12:41 PM EDT ----- Case was discussed on tumor board.  Recommend  PET scan- breast cancer.  And have her follow up with me a few days after PET.  No need to send oncotype dx Thanks.   zy

## 2021-11-25 NOTE — Telephone Encounter (Signed)
Incoming call from patient, she is wanting a refill on her Oxycodone.  She had mastectomy done on 11/15/21 by Dr. Christian Mate.  Wants refill.  Pharmacy in chart verified and correct.  Told her I wasn't for sure if this would be refilled, she said she didn't care as long as she got some sort of pain med. Thank you.

## 2021-11-27 ENCOUNTER — Other Ambulatory Visit: Payer: Self-pay | Admitting: Oncology

## 2021-11-28 ENCOUNTER — Ambulatory Visit (INDEPENDENT_AMBULATORY_CARE_PROVIDER_SITE_OTHER): Payer: Medicaid Other | Admitting: Surgery

## 2021-11-28 ENCOUNTER — Other Ambulatory Visit: Payer: Self-pay

## 2021-11-28 ENCOUNTER — Encounter: Payer: Self-pay | Admitting: Surgery

## 2021-11-28 VITALS — BP 133/84 | HR 75 | Temp 98.2°F | Ht 61.0 in | Wt 169.0 lb

## 2021-11-28 DIAGNOSIS — Z9013 Acquired absence of bilateral breasts and nipples: Secondary | ICD-10-CM

## 2021-11-28 DIAGNOSIS — C50511 Malignant neoplasm of lower-outer quadrant of right female breast: Secondary | ICD-10-CM

## 2021-11-28 DIAGNOSIS — C50412 Malignant neoplasm of upper-outer quadrant of left female breast: Secondary | ICD-10-CM

## 2021-11-28 DIAGNOSIS — Z08 Encounter for follow-up examination after completed treatment for malignant neoplasm: Secondary | ICD-10-CM

## 2021-11-28 MED ORDER — CEPHALEXIN 500 MG PO CAPS: 500.0000 mg | ORAL_CAPSULE | Freq: Four times a day (QID) | ORAL | 0 refills | Status: AC

## 2021-11-28 NOTE — Progress Notes (Signed)
[facility] POST-OP OFFICE VISIT  11/28/2021  HPI: Sharon Woods is a 60 y.o. female 13 days s/p right modified radical mastectomy, and left total mastectomy with sentinel lymph node biopsy.  Patient presents since today with no new complaints, denies fevers or chills, does not know what her current or most recent blood glucose would be as she is not testing.  Vital signs: BP 133/84   Pulse 75   Temp 98.2 F (36.8 C) (Oral)   Ht '5\' 1"'$  (1.549 m)   Wt 169 lb (76.7 kg)   LMP  (LMP Unknown) Comment: Patient is not a good historian  SpO2 99%   BMI 31.93 kg/m    Physical Exam: Constitutional: Appears at baseline Skin: Right drain, is pulled to the point where the vacuum has been violated.  There is some mild hyperemia of the midportion of the right mastectomy incision.  There is no fluctuance, or perceived residual seroma. Both drains removed.  The left chest wall appears to be free of any erythema.  There is no evidence of hematoma or seroma present.  Assessment/Plan: This is a 61 y.o. female 13 days s/p bilateral mastectomies.  Advanced disease on pathology.  Patient Active Problem List   Diagnosis Date Noted   Status post bilateral mastectomy 11/21/2021   Bilateral breast cancer ([place]) 11/15/2021   Diabetes mellitus ([place]) 10/08/2021   Genetic testing 10/07/2021   Diabetes ([place]) 09/28/2021   Vaginitis 09/28/2021   Thrombocytopenia ([place]) 09/08/2021   Malignant neoplasm of left breast ([place]) 08/28/2021   Malignant neoplasm of lower-outer quadrant of right breast of female, estrogen receptor positive ([place]) 08/28/2021   Goals of care, counseling/discussion 08/28/2021   Severe recurrent major depression with psychotic features ([place]) 11/15/2015   Hypertension 11/15/2015   Noncompliance 11/15/2015   Severe major depression, single episode, with psychotic features, mood-congruent ([place]) 03/29/2015   Protein-calorie malnutrition, severe 03/26/2015   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     -We will start Keflex 500 mg p.o. 4 times daily, for 10 days.  We will follow-up in a week or as needed.  We discussed what to watch for, increasing redness, fever chills pain fluid buildup.   [name] M.D., FACS 11/28/2021, 11:52 AM

## 2021-11-28 NOTE — Patient Instructions (Signed)
Please pick up medication at the pharmacy.

## 2021-12-03 ENCOUNTER — Other Ambulatory Visit: Payer: Self-pay | Admitting: Oncology

## 2021-12-04 ENCOUNTER — Other Ambulatory Visit: Payer: Self-pay

## 2021-12-04 DIAGNOSIS — C50919 Malignant neoplasm of unspecified site of unspecified female breast: Secondary | ICD-10-CM

## 2021-12-05 ENCOUNTER — Encounter: Payer: Self-pay | Admitting: Surgery

## 2021-12-05 ENCOUNTER — Inpatient Hospital Stay: Payer: Medicaid Other | Attending: Oncology

## 2021-12-05 ENCOUNTER — Ambulatory Visit (INDEPENDENT_AMBULATORY_CARE_PROVIDER_SITE_OTHER): Payer: Medicaid Other | Admitting: Surgery

## 2021-12-05 ENCOUNTER — Inpatient Hospital Stay (HOSPITAL_BASED_OUTPATIENT_CLINIC_OR_DEPARTMENT_OTHER): Payer: Medicaid Other | Admitting: Nurse Practitioner

## 2021-12-05 ENCOUNTER — Other Ambulatory Visit: Payer: Self-pay

## 2021-12-05 ENCOUNTER — Encounter: Payer: Self-pay | Admitting: Nurse Practitioner

## 2021-12-05 VITALS — BP 161/97 | HR 76 | Temp 98.3°F | Ht 61.0 in | Wt 168.8 lb

## 2021-12-05 VITALS — BP 131/90 | HR 80 | Temp 97.8°F | Resp 16 | Wt 170.0 lb

## 2021-12-05 DIAGNOSIS — E119 Type 2 diabetes mellitus without complications: Secondary | ICD-10-CM | POA: Insufficient documentation

## 2021-12-05 DIAGNOSIS — C50511 Malignant neoplasm of lower-outer quadrant of right female breast: Secondary | ICD-10-CM | POA: Diagnosis not present

## 2021-12-05 DIAGNOSIS — Z08 Encounter for follow-up examination after completed treatment for malignant neoplasm: Secondary | ICD-10-CM

## 2021-12-05 DIAGNOSIS — F323 Major depressive disorder, single episode, severe with psychotic features: Secondary | ICD-10-CM | POA: Diagnosis not present

## 2021-12-05 DIAGNOSIS — Z9011 Acquired absence of right breast and nipple: Secondary | ICD-10-CM | POA: Insufficient documentation

## 2021-12-05 DIAGNOSIS — Z87891 Personal history of nicotine dependence: Secondary | ICD-10-CM | POA: Diagnosis not present

## 2021-12-05 DIAGNOSIS — C50412 Malignant neoplasm of upper-outer quadrant of left female breast: Secondary | ICD-10-CM

## 2021-12-05 DIAGNOSIS — C801 Malignant (primary) neoplasm, unspecified: Secondary | ICD-10-CM

## 2021-12-05 DIAGNOSIS — F32A Depression, unspecified: Secondary | ICD-10-CM | POA: Diagnosis not present

## 2021-12-05 DIAGNOSIS — F418 Other specified anxiety disorders: Secondary | ICD-10-CM | POA: Insufficient documentation

## 2021-12-05 DIAGNOSIS — C50919 Malignant neoplasm of unspecified site of unspecified female breast: Secondary | ICD-10-CM

## 2021-12-05 DIAGNOSIS — E1169 Type 2 diabetes mellitus with other specified complication: Secondary | ICD-10-CM | POA: Diagnosis not present

## 2021-12-05 DIAGNOSIS — Z809 Family history of malignant neoplasm, unspecified: Secondary | ICD-10-CM | POA: Insufficient documentation

## 2021-12-05 DIAGNOSIS — F411 Generalized anxiety disorder: Secondary | ICD-10-CM | POA: Diagnosis not present

## 2021-12-05 DIAGNOSIS — Z9013 Acquired absence of bilateral breasts and nipples: Secondary | ICD-10-CM

## 2021-12-05 DIAGNOSIS — Z170 Estrogen receptor positive status [ER+]: Secondary | ICD-10-CM | POA: Diagnosis not present

## 2021-12-05 LAB — BASIC METABOLIC PANEL
Anion gap: 9 (ref 5–15)
BUN: 15 mg/dL (ref 6–20)
CO2: 25 mmol/L (ref 22–32)
Calcium: 9.1 mg/dL (ref 8.9–10.3)
Chloride: 109 mmol/L (ref 98–111)
Creatinine, Ser: 0.71 mg/dL (ref 0.44–1.00)
GFR, Estimated: >60 mL/min (ref >60)
Glucose, Bld: 203 mg/dL — ABNORMAL HIGH (ref 70–99)
Potassium: 3.6 mmol/L (ref 3.5–5.1)
Sodium: 143 mmol/L (ref 135–145)

## 2021-12-05 MED ORDER — OXYCODONE HCL 5 MG PO TABS: 5.0000 mg | ORAL_TABLET | Freq: Four times a day (QID) | ORAL | 0 refills | Status: DC | PRN

## 2021-12-05 NOTE — Progress Notes (Signed)
[place] [facility] [place]. [facility] [address], [place], [place] [address] [phone] (phone) [phone] (fax)  Patient Care Team: Patient, No Pcp Per as PCP - General (General Practice) Brannock, Heath Gold, RN Earlie Server, MD as Consulting Physician (Oncology) Daiva Huge, RN as Oncology Nurse Navigator   Name of the patient: Sharon Woods  [id number]  01-05-1961   Date of visit: 10/08/21  Diagnosis- Lobular Breast Cancer  Chief complaint/ Reason for visit- Follow Up For Diabetes  Heme/Onc history:  Oncology History  Malignant neoplasm of left breast ([place])  08/06/2021 Mammogram   08/06/2021, digital bilateral mammogram and ultrasound showed left breast 3:00 mass, 1.7 x 1.7 x 1.7 cm, ultrasound of the left axillary is negative. 08/21/2021 right breast 1 x 0.7 x 0.8 cm angulated spiculated mass at the right breast 7:00, 5 cm from nipple.  Ultrasound of the right axillary demonstrates 3 abnormal thickened cortex lymph node. 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 Franki Cabot right breast 7:00, invasive mammary carcinoma with lobular features, in situ carcinoma,  ER+, PR+, HER 2-This was found to be concordant by Dr. Franki Cabot. right axilla lymph node +, extracapsular extension.  -This was found to be concordant by Dr. Franki Cabot   08/28/2021 Initial Diagnosis   Malignant neoplasm of upper-outer quadrant of left breast in female, estrogen receptor positive ([place])   08/28/2021 Imaging   CT CHEST, ABDOMEN, AND PELVIS WITH CONTRAST Asymmetric mildly enlarged right axillary nodes. Correlate with biopsy. There are some punctate foci of sclerosis in the included spine that could reflect subtle metastatic disease. Nonobstructing  bilateral renal calculi.   08/29/2021 Imaging   BILATERAL BREAST MRI WITH AND WITHOUT CONTRAST 1. 2.5 centimeter enhancing mass in the LOWER OUTER QUADRANT of the RIGHT breast, correlating with known malignancy. 2. At least 4 enlarged RIGHT axillary lymph nodes, correlating well with recently biopsied lymph node showing metastatic disease. 3. 3.4 centimeter enhancing mass in the LEFT breast, with an additional 2.9 centimeters of non mass enhancement posterior to the mass also suspicious for malignancy. This likely correlates with the area calcifications seen mammographically. 4. 5 millimeters satellite nodule along the LATERAL aspect of known malignancy in the LOWER OUTER QUADRANT of the LEFT breast. 5. LEFT axilla is negative for adenopathy.       09/05/2021 Imaging   NUCLEAR MEDICINE WHOLE BODY BONE SCAN Uptake at L2 which is nonspecific but may be related to advanced degenerative disc and facet disease changes at both L1-L2 and L2-L3; no evidence of osseous metastatic disease by CT. No definite osseous metastatic lesions identified.   09/23/2021 Surgery   Surgery    Genetic Testing   Negative genetic testing. No pathogenic variants identified on the [facility] CancerNext-Expanded+RNA panel. The report date is 10/03/2021.  The CancerNext-Expanded + RNAinsight gene panel offered by [employer] and includes sequencing and rearrangement analysis for the following 77 genes: IP, ALK, APC*, ATM*, AXIN2, BAP1, BARD1, BLM, BMPR1A, BRCA1*, BRCA2*, BRIP1*, CDC73, CDH1*,CDK4, CDKN1B, CDKN2A, CHEK2*, CTNNA1, DICER1, FANCC, FH, FLCN, GALNT12, KIF1B, LZTR1, MAX, MEN1, MET, MLH1*, MSH2*, MSH3, MSH6*, MUTYH*, NBN, NF1*, NF2, NTHL1, PALB2*, PHOX2B, PMS2*, POT1, PRKAR1A, PTCH1, PTEN*, RAD51C*, RAD51D*,RB1, RECQL, RET, SDHA, SDHAF2, SDHB, SDHC, SDHD, SMAD4, SMARCA4, SMARCB1, SMARCE1, STK11, SUFU, TMEM127, TP53*,TSC1, TSC2, VHL and  XRCC2 (sequencing and deletion/duplication); EGFR, EGLN1, HOXB13, KIT, MITF, PDGFRA,  POLD1 and POLE (sequencing only); EPCAM and GREM1 (deletion/duplication only).   11/25/2021 Cancer Staging   Staging form: Breast, AJCC 8th Edition - Pathologic stage from 11/25/2021: Stage IB (pT2, pN1, cM0, G2, ER+, PR+, HER2-) - Signed by Earlie Server, MD on 11/25/2021 Stage prefix: Initial diagnosis Multigene prognostic tests performed: None Histologic grading system: 3 grade system   Malignant neoplasm of lower-outer quadrant of right breast of female, estrogen receptor positive ([place])  08/06/2021 Mammogram   08/06/2021, digital bilateral mammogram and ultrasound showed left breast 3:00 mass, 1.7 x 1.7 x 1.7 cm, ultrasound of the left axillary is negative. 08/21/2021 right breast 1 x 0.7 x 0.8 cm angulated spiculated mass at the right breast 7:00, 5 cm from nipple.  Ultrasound of the right axillary demonstrates 3 abnormal thickened cortex lymph node. 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 Franki Cabot right breast 7:00, invasive mammary carcinoma with lobular features, in situ carcinoma,  ER+, PR+, HER 2-This was found to be concordant by Dr. Franki Cabot. right axilla lymph node +, extracapsular extension.  -This was found to be concordant by Dr. Franki Cabot   08/28/2021 Initial Diagnosis   Malignant neoplasm of lower-outer quadrant of right breast of female, estrogen receptor positive ([place])   08/28/2021 Imaging   CT CHEST, ABDOMEN, AND PELVIS WITH CONTRAST Asymmetric mildly enlarged right axillary nodes. Correlate with biopsy. There are some punctate foci of sclerosis in the included spine that could reflect subtle metastatic disease. Nonobstructing bilateral renal calculi.   08/29/2021 Imaging   BILATERAL BREAST MRI WITH AND WITHOUT CONTRAST 1. 2.5 centimeter enhancing mass in the LOWER OUTER QUADRANT of the RIGHT breast,  correlating with known malignancy. 2. At least 4 enlarged RIGHT axillary lymph nodes, correlating well with recently biopsied lymph node showing metastatic disease. 3. 3.4 centimeter enhancing mass in the LEFT breast, with an additional 2.9 centimeters of non mass enhancement posterior to the mass also suspicious for malignancy. This likely correlates with the area calcifications seen mammographically. 4. 5 millimeters satellite nodule along the LATERAL aspect of known malignancy in the LOWER OUTER QUADRANT of the LEFT breast. 5. LEFT axilla is negative for adenopathy.       09/05/2021 Imaging   NUCLEAR MEDICINE WHOLE BODY BONE SCAN Uptake at L2 which is nonspecific but may be related to advanced degenerative disc and facet disease changes at both L1-L2 and L2-L3; no evidence of osseous metastatic disease by CT. No definite osseous metastatic lesions identified.   09/09/2021 Oncotype testing   [id number] block RIGHT 7:00 5 CM FN; ULTRASOUND-GUIDED BIOPSY Oncotype Dx RS score 22   09/23/2021 Surgery   Surgery    Genetic Testing   Negative genetic testing. No pathogenic variants identified on the [facility] CancerNext-Expanded+RNA panel. The report date is 10/03/2021.  The CancerNext-Expanded + RNAinsight gene panel offered by [employer] and includes sequencing and rearrangement analysis for the following 77 genes: IP, ALK, APC*, ATM*, AXIN2, BAP1, BARD1, BLM, BMPR1A, BRCA1*, BRCA2*, BRIP1*, CDC73, CDH1*,CDK4, CDKN1B, CDKN2A, CHEK2*, CTNNA1, DICER1, FANCC, FH, FLCN, GALNT12, KIF1B, LZTR1, MAX, MEN1, MET, MLH1*, MSH2*, MSH3, MSH6*, MUTYH*, NBN, NF1*, NF2, NTHL1, PALB2*, PHOX2B, PMS2*, POT1, PRKAR1A, PTCH1, PTEN*, RAD51C*, RAD51D*,RB1, RECQL, RET, SDHA, SDHAF2, SDHB, SDHC, SDHD, SMAD4, SMARCA4, SMARCB1, SMARCE1, STK11, SUFU, TMEM127, TP53*,TSC1, TSC2, VHL and  XRCC2 (sequencing and deletion/duplication); EGFR, EGLN1, HOXB13, KIT, MITF, PDGFRA, POLD1 and POLE (sequencing only); EPCAM and GREM1  (deletion/duplication only).   11/25/2021 Cancer Staging   Staging form: Breast, AJCC 8th Edition - Pathologic stage from 11/25/2021: Stage IIIA (pT1c, pN3a, cM0, G2, ER+, PR+, HER2-) - Signed by Earlie Server, MD on 11/25/2021 Stage prefix: Initial diagnosis Multigene prognostic tests performed: None Histologic grading system: 3 grade system     Interval history- Patient is 61 year old female newly diagnosed with lobular breast cancer currently on endocrine therapy, now s/p bilateral mastectomy who returns to clinic for follow up for diabetes. She continues to report compliance with blood sugar monitoring and medications. She presents log and medications for review. Tolerating medications well. Has some limited range of motion and is going to be going to physical therapy. Reports some anxiety with frequent appointments and uncertainty of future given her cancer. Questions what happens next and if she will take medications long term.   ECOG FS:1 - Symptomatic but completely ambulatory  Review of systems- Review of Systems  Constitutional:  Negative for chills, fever, malaise/fatigue and weight loss.  HENT:  Negative for hearing loss, nosebleeds, sore throat and tinnitus.   Eyes:  Negative for blurred vision and double vision.  Respiratory:  Negative for cough, hemoptysis, shortness of breath and wheezing.   Cardiovascular:  Positive for chest pain (secondary to bilateral mastectomy). Negative for palpitations and leg swelling.  Gastrointestinal:  Negative for abdominal pain, blood in stool, constipation, diarrhea, melena, nausea and vomiting.  Genitourinary:  Negative for dysuria and urgency.  Musculoskeletal:  Negative for back pain, falls, joint pain and myalgias.  Skin:  Negative for itching and rash.  Neurological:  Negative for dizziness, tingling, sensory change, loss of consciousness, weakness and headaches.  Endo/Heme/Allergies:  Negative for environmental allergies. Does not bruise/bleed  easily.  Psychiatric/Behavioral:  Negative for depression. The patient is nervous/anxious. The patient does not have insomnia.     Allergies  Allergen Reactions   Tylenol [Acetaminophen]     Per pt due to cirrhosis of the liver   Aleve [Naproxen] Rash    Patient Active Problem List   Diagnosis Date Noted   Status post bilateral mastectomy 11/21/2021   Bilateral breast cancer ([place]) 11/15/2021   Genetic testing 10/07/2021   Vaginitis 09/28/2021   Thrombocytopenia ([place]) 09/08/2021   Malignant neoplasm of left breast ([place]) 08/28/2021   Malignant neoplasm of lower-outer quadrant of right breast of female, estrogen receptor positive ([place]) 08/28/2021   Goals of care, counseling/discussion 08/28/2021   Severe recurrent major depression with psychotic features ([place]) 11/15/2015   Hypertension 11/15/2015   Noncompliance 11/15/2015   Severe major depression, single episode, with psychotic features, mood-congruent ([place]) 03/29/2015   Protein-calorie malnutrition, severe 03/26/2015   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    Past Medical History:  Diagnosis Date   Anxiety    Depression    Diabetes mellitus without complication ([place])    History of high cholesterol 2000   Hypertension    Patient denies medical problems    Thrombocytopenia ([place])     Past Surgical History:  Procedure Laterality Date   ABDOMINAL HYSTERECTOMY     AXILLARY SENTINEL NODE BIOPSY Left 11/15/2021   Procedure: AXILLARY SENTINEL NODE BIOPSY;  Surgeon: [name], MD;  Location: [facility] ORS;  Service: General;  Laterality: Left;   BREAST BIOPSY Right 08/21/2021   [place] bx 7:00 mass coil clip path pending   BREAST BIOPSY Right 08/21/2021   [place] bx of LN,  hydro marker, path pending   BREAST BIOPSY Left 08/21/2021   [place] bx, heart marker, path pending   CESAREAN SECTION     x2   MASTECTOMY MODIFIED RADICAL Right 11/15/2021   Procedure: MASTECTOMY MODIFIED RADICAL;  Surgeon: [name], MD;  Location: [facility] ORS;  Service: General;   Laterality: Right;   NO PAST SURGERIES     TOTAL MASTECTOMY Left 11/15/2021   Procedure: TOTAL MASTECTOMY;  Surgeon: [name], MD;  Location: [facility] ORS;  Service: General;  Laterality: Left;    Social History   Socioeconomic History   Marital status: Married    Spouse name: Not on file   Number of children: Not on file   Years of education: Not on file   Highest education level: Not on file  Occupational History   Not on file  Tobacco Use   Smoking status: Former    Types: Cigarettes    Quit date: 2013    Years since quitting: 10.6   Smokeless tobacco: Never  Vaping Use   Vaping Use: Never used  Substance and Sexual Activity   Alcohol use: Yes    Comment: occasionally   Drug use: No   Sexual activity: Not Currently    Comment: unable to assess   Other Topics Concern   Not on file  Social History Narrative   Not on file   Social Determinants of Health   Financial Resource Strain: Medium Risk (10/10/2021)   Overall Financial Resource Strain (CARDIA)    Difficulty of Paying Living Expenses: Somewhat hard  Food Insecurity: No Food Insecurity (10/10/2021)   Hunger Vital Sign    Worried About Running Out of Food in the Last Year: Never true    Ran Out of Food in the Last Year: Never true  Transportation Needs: Unmet Transportation Needs (09/16/2021)   PRAPARE - Transportation    Lack of Transportation (Medical): Yes    Lack of Transportation (Non-Medical): Yes  Physical Activity: Inactive (10/10/2021)   Exercise Vital Sign    Days of Exercise per Week: 0 days    Minutes of Exercise per Session: 0 min  Stress: Stress Concern Present (10/10/2021)   [place]    Feeling of Stress : To some extent  Social Connections: Socially Isolated (10/10/2021)   Social Connection and Isolation Panel [NHANES]    Frequency of Communication with Friends and Family: Once a week    Frequency of Social Gatherings with  Friends and Family: Never    Attends Religious Services: Never    Printmaker: No    Attends Music therapist: Never    Marital Status: Married  Human resources officer Violence: Not on file    Family History  Problem Relation Age of Onset   Lung cancer Mother    Dementia Mother    Parkinson's disease Father    Cancer Father        unk type   Diabetes Sister    Heart attack Sister    Breast cancer Sister    Cancer Maternal Uncle        unk types   Dementia Maternal Grandmother    Cancer Maternal Grandmother        unk type   Cancer Other    Dementia Other     Current Outpatient Medications:    ACCU-CHEK GUIDE test strip, USE TO CHECK BLOOD SUGAR UP TO 4 TIMES DAILY AS DIRECTED, Disp: 100 each,  Rfl: 0   Accu-Chek Softclix Lancets lancets, USE TO CHECK BLOOD SUAGR UP TO 4 TIMES DAILY AS DIRECTED, Disp: 100 each, Rfl: 0   blood glucose meter kit and supplies KIT, Dispense based on patient and insurance preference. Use up to four times daily as directed., Disp: 1 each, Rfl: 1   calcium carbonate (TUMS - DOSED IN MG ELEMENTAL CALCIUM) 500 MG chewable tablet, Chew 1 tablet by mouth daily., Disp: , Rfl:    cephALEXin (KEFLEX) 500 MG capsule, Take 1 capsule (500 mg total) by mouth 4 (four) times daily for 10 days., Disp: 40 capsule, Rfl: 0   glipiZIDE (GLUCOTROL) 5 MG tablet, Take 1 tablet (5 mg total) by mouth daily before breakfast., Disp: 30 tablet, Rfl: 1   letrozole (FEMARA) 2.5 MG tablet, Take 1 tablet by mouth once daily, Disp: 30 tablet, Rfl: 0   metFORMIN (GLUCOPHAGE) 500 MG tablet, Take 1 tablet (500 mg total) by mouth daily., Disp: 90 tablet, Rfl: 1   oxyCODONE (OXY IR/ROXICODONE) 5 MG immediate release tablet, Take 1 tablet (5 mg total) by mouth every 6 (six) hours as needed for up to 24 doses for severe pain or breakthrough pain., Disp: 24 tablet, Rfl: 0   triamcinolone cream (KENALOG) 0.1 %, Apply 1 Application topically 2 (two) times  daily., Disp: 80 g, Rfl: 0   Physical exam:  Vitals:   12/05/21 1358  BP: (!) 131/90  Pulse: 80  Resp: 16  Temp: 97.8 F (36.6 C)  TempSrc: Tympanic  SpO2: 100%  Weight: 170 lb (77.1 kg)    Physical Exam Constitutional:      General: She is not in acute distress.    Appearance: She is well-developed. She is not ill-appearing.  Eyes:     General: No scleral icterus. Cardiovascular:     Rate and Rhythm: Normal rate and regular rhythm.  Pulmonary:     Effort: Pulmonary effort is normal.     Breath sounds: Normal breath sounds.  Abdominal:     General: There is no distension.     Palpations: Abdomen is soft.     Tenderness: There is no abdominal tenderness. There is no rebound.  Musculoskeletal:        General: No tenderness or deformity.     Right lower leg: No edema.     Left lower leg: No edema.     Comments: Chest wall- dressings in place  Skin:    General: Skin is warm and dry.     Coloration: Skin is not pale.  Neurological:     General: No focal deficit present.     Mental Status: She is alert and oriented to person, place, and time.     Motor: No weakness.  Psychiatric:        Mood and Affect: Mood normal.        Behavior: Behavior normal.        Latest Ref Rng & Units 12/05/2021    1:08 PM  CMP  Glucose 70 - 99 mg/dL 203   BUN 6 - 20 mg/dL 15   Creatinine 0.44 - 1.00 mg/dL 0.71   Sodium 135 - 145 mmol/L 143   Potassium 3.5 - 5.1 mmol/L 3.6   Chloride 98 - 111 mmol/L 109   CO2 22 - 32 mmol/L 25   Calcium 8.9 - 10.3 mg/dL 9.1       Latest Ref Rng & Units 11/17/2021    4:30 AM  CBC  Hemoglobin 12.0 - 15.0  g/dL 11.9   Hematocrit 36.0 - 46.0 % 36.8    Lab Results  Component Value Date   HGBA1C 7.8 (H) 11/04/2021    NM Sentinel Node Inj-No Rpt (Breast)  Result Date: 11/15/2021 Sulfur Colloid was injected by the Nuclear Medicine Technologist for sentinel lymph node localization.    Assessment and plan- Patient is a 61 y.o. female diagnosed with  lobular breast cancer who presents to clinic for follow up.   Lobular breast cancer- recommendation was for bilateral mastectomy but this was held d/t surgical risk increased with hA1c > 10.2% (see below). She started neo-adjuvant endocrine therapy. Blood sugar improved and she underwent bilateral mastectomy with Dr. Christian Mate. She will continue endocrine therapy and follow up with Dr Tasia Catchings as scheduled to review pathology and PET scan results.  Candidiasis- urinary and vulvar. likely related to elevated blood sugars. May not be an ideal candidate for SGLT2 inhibitors as it sounds like she's struggled with yeast for some time. Yeast on UA and on pelvic exam previously. Now s/p diflucan 200 mg daily x 1 day then 100 mg x 6 days. Improved. Continue maintenance measures.  Diabetes Mellitus- type 2. HgA1c > 10 which prevented surgery. Previously hga1c < 9 was recommended. She was started on metformin and glipizide and on 11/04/21, hgba1c improved to 7.8. She has had good progress in maintaining a more stable blood sugar. Sugar log reviewed and sugars range from 140s-230s. She will continue metformin and glipizide for now. Also consider starting ace-I in the future for kidney protective benefits. I will assist her in establishing care with PCP to manage her diabetes long term.  Financial insecurity/Underinsured- patient now has medicaid healthy blue. Is continuing to work on getting Social security benefits. Reports difficulty finding pcp that takes her insurance. Appreciates advice from social work.  No PCP- I personally contacted patient engagement and assisted with establishing patient with primary care and ensuring that her insurance would be accepted as this has been an ongoing hardship for her in the past. She has now been scheduled for appointment at [facility] and has appointment in Monday, October 23rd at 2:00. Advised her of their no show policy.  Anxiety associated with cancer diagnosis & hx of depression  with psychotic features- Requests long term management of psychiatric conditions. Previously saw Dr. Cora Collum but was 6+ years ago. Will need referral but unclear if her insurance will be covered. Referral sent.   Disposition: She will follow up with Dr Tasia Catchings as scheduled. RTC as needed in interim.   Visit Diagnosis 1. Type 2 diabetes mellitus with other specified complication, without long-term current use of insulin ([place])   2. Invasive carcinoma of breast ([place])   3. Severe major depression, single episode, with psychotic features, mood-congruent ([place])   4. Anxiety associated with cancer diagnosis [facility])    Patient expressed understanding and was in agreement with this plan. She also understands that She can call clinic at any time with any questions, concerns, or complaints.   Thank you for allowing me to participate in the care of this patient.   Beckey Rutter, DNP, AGNP-C [facility] [place]

## 2021-12-05 NOTE — Patient Instructions (Addendum)
We have gotten you an appointment to establish care with a PCP. Dr. Rosana Berger will begin managing your diabetes once you start seeing her. If you can't make this appointment, you must notify the [facility] at least 24 hours in advance or they will not reschedule you. Continue your metformin and glipizide. Dr Tasia Catchings will continue to follow you for your breast cancer.

## 2021-12-05 NOTE — Progress Notes (Signed)
[facility] POST-OP OFFICE VISIT  12/05/2021  HPI: Sharon Woods is a 61 y.o. female 3 weeks s/p bilateral mastectomies for locally advanced disease at minimum.  She notes having slight challenge at elevating both arms, demonstrating diminished range of motion.  She is still having some serous drainage through the drain site on the right.  There is no evidence of seroma.  The reddened areas of her bilateral skin flaps are all fading well.  Steri-Strips are intact, and evidence of cellulitis completely resolving.  She still has 3+ days of antibiotics I have encouraged her to finish the course.  She would like something for anxiety, she will see Dr. Tasia Catchings later this afternoon.  Vital signs: BP (!) 161/97   Pulse 76   Temp 98.3 F (36.8 C) (Oral)   Ht '5\' 1"'$  (1.549 m)   Wt 168 lb 12.8 oz (76.6 kg)   LMP  (LMP Unknown) Comment: Patient is not a good historian  SpO2 98%   BMI 31.89 kg/m    Physical Exam: Constitutional: She appears at her baseline  Skin: Much improved clear, clearly responding to Keflex well.  Reapplied Band-Aid to pinpoint area of clear serous drainage at the right posterior JP drain site.  No evidence of fluctuance, seroma or abscess at this time.  Assessment/Plan: This is a 61 y.o. female 3 weeks s/p bilateral mastectomies for advanced disease.  Patient Active Problem List   Diagnosis Date Noted   Status post bilateral mastectomy 11/21/2021   Bilateral breast cancer ([place]) 11/15/2021   Genetic testing 10/07/2021   Vaginitis 09/28/2021   Thrombocytopenia ([place]) 09/08/2021   Malignant neoplasm of left breast ([place]) 08/28/2021   Malignant neoplasm of lower-outer quadrant of right breast of female, estrogen receptor positive ([place]) 08/28/2021   Goals of care, counseling/discussion 08/28/2021   Severe recurrent major depression with psychotic features ([place]) 11/15/2015   Hypertension 11/15/2015   Noncompliance 11/15/2015   Severe major depression, single  episode, with psychotic features, mood-congruent ([place]) 03/29/2015   Protein-calorie malnutrition, severe 03/26/2015   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  Finish current course of oral antibiotics.  Follow-up as needed or in 2 weeks. Reassured they may shower, avoid tub bathing.  We discussed mastectomy flap numbness as expected.  Reassurance is given, and availability to be seen as needed also reassured.   [name] M.D., FACS 12/05/2021, 9:24 AM

## 2021-12-05 NOTE — Patient Instructions (Addendum)
Referral to Physical Therapy sent. Someone will contact you to make an appointment. If you do not hear from anyone within 7-10 days please call and let us know.   Please see your follow up appointment listed below.   Please check your blood sugars in the morning on a daily basis.  You may shower as usual.

## 2021-12-09 ENCOUNTER — Encounter
Admission: RE | Admit: 2021-12-09 | Discharge: 2021-12-09 | Disposition: A | Discharge status: Home / Self Care | Payer: Medicaid Other | Source: Ambulatory Visit | Attending: Oncology | Admitting: Oncology

## 2021-12-09 DIAGNOSIS — C50912 Malignant neoplasm of unspecified site of left female breast: Secondary | ICD-10-CM | POA: Diagnosis not present

## 2021-12-09 DIAGNOSIS — C50919 Malignant neoplasm of unspecified site of unspecified female breast: Secondary | ICD-10-CM | POA: Insufficient documentation

## 2021-12-09 DIAGNOSIS — C50911 Malignant neoplasm of unspecified site of right female breast: Secondary | ICD-10-CM | POA: Diagnosis not present

## 2021-12-09 LAB — GLUCOSE, CAPILLARY: Glucose-Capillary: 93 mg/dL (ref 70–99)

## 2021-12-09 MED ORDER — FLUDEOXYGLUCOSE F - 18 (FDG) INJECTION
9.3400 | Freq: Once | INTRAVENOUS | Status: AC | PRN
  Administered 2021-12-09: 9.34 via INTRAVENOUS

## 2021-12-13 ENCOUNTER — Inpatient Hospital Stay (HOSPITAL_BASED_OUTPATIENT_CLINIC_OR_DEPARTMENT_OTHER): Payer: Medicaid Other | Admitting: Oncology

## 2021-12-13 ENCOUNTER — Encounter: Payer: Self-pay | Admitting: Oncology

## 2021-12-13 VITALS — BP 137/76 | HR 70 | Temp 96.8°F | Resp 18 | Wt 169.0 lb

## 2021-12-13 DIAGNOSIS — Z170 Estrogen receptor positive status [ER+]: Secondary | ICD-10-CM

## 2021-12-13 DIAGNOSIS — F418 Other specified anxiety disorders: Secondary | ICD-10-CM | POA: Diagnosis not present

## 2021-12-13 DIAGNOSIS — C50412 Malignant neoplasm of upper-outer quadrant of left female breast: Secondary | ICD-10-CM | POA: Diagnosis not present

## 2021-12-13 DIAGNOSIS — C50511 Malignant neoplasm of lower-outer quadrant of right female breast: Secondary | ICD-10-CM

## 2021-12-13 DIAGNOSIS — Z7189 Other specified counseling: Secondary | ICD-10-CM | POA: Diagnosis not present

## 2021-12-13 DIAGNOSIS — E119 Type 2 diabetes mellitus without complications: Secondary | ICD-10-CM | POA: Diagnosis not present

## 2021-12-13 DIAGNOSIS — C50919 Malignant neoplasm of unspecified site of unspecified female breast: Secondary | ICD-10-CM | POA: Diagnosis not present

## 2021-12-13 DIAGNOSIS — F323 Major depressive disorder, single episode, severe with psychotic features: Secondary | ICD-10-CM

## 2021-12-13 DIAGNOSIS — Z87891 Personal history of nicotine dependence: Secondary | ICD-10-CM | POA: Diagnosis not present

## 2021-12-13 DIAGNOSIS — Z809 Family history of malignant neoplasm, unspecified: Secondary | ICD-10-CM | POA: Diagnosis not present

## 2021-12-13 DIAGNOSIS — Z9011 Acquired absence of right breast and nipple: Secondary | ICD-10-CM | POA: Diagnosis not present

## 2021-12-13 DIAGNOSIS — F32A Depression, unspecified: Secondary | ICD-10-CM | POA: Diagnosis not present

## 2021-12-13 NOTE — Progress Notes (Signed)
Pt here for follow up. No new concerns voiced.   

## 2021-12-13 NOTE — Assessment & Plan Note (Signed)
Patient does not have primary care provider. A1c has improved.  Continue glipizide and metformin.  She has appt to establish care with PCP in Oct 2023

## 2021-12-13 NOTE — Progress Notes (Signed)
Hematology/Oncology Consult note [phone] [phone]         Patient Care Team: Patient, No Pcp Per as PCP - General (General Practice) Brannock, Heath Gold, RN Earlie Server, MD as Consulting Physician (Oncology) Daiva Huge, RN as Oncology Nurse Navigator  ASSESSMENT & PLAN:   Cancer Staging  Malignant neoplasm of left breast [facility]) Staging form: Breast, AJCC 8th Edition - Pathologic stage from 11/25/2021: Stage IB (pT2, pN1, cM0, G2, ER+, PR+, HER2-) - Signed by Earlie Server, MD on 11/25/2021  Malignant neoplasm of lower-outer quadrant of right breast of female, estrogen receptor positive ([place]) Staging form: Breast, AJCC 8th Edition - Pathologic stage from 11/25/2021: Stage IIIA (pT1c, pN3a, cM0, G2, ER+, PR+, HER2-) - Signed by Earlie Server, MD on 11/25/2021   Malignant neoplasm of lower-outer quadrant of right breast of female, estrogen receptor positive ([place]) # Left breast, invasive lobular carcinoma Grade 2, 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  pT2 pN1a, ER +90%, PR +90%, HER2 negative (1+)  # Right Breast invasive lobular carcinoma Grade 2, negative margins, right axillary lymph nodes  30/32 involved with metastatic carcinoma, pT1c pN3a ER +90%, PR +51-90%, HER2 negative (1+)  Patient has poor insights of her condition. Not a candidate for adjuvant IV chemotherapy. Refer to RadOnc for Adjuvant radiation.  Plan adjuvant endocrine therapy with CKD 4/5 inhibitors.   Malignant neoplasm of left breast ([place]) See above plan.  Severe major depression, single episode, with psychotic features, mood-congruent ([place]) Not on any antidepressants.  Referred to psychiatrist. She has not established care  Diabetes mellitus [facility]) Patient does not have primary care provider. A1c has improved.  Continue glipizide and  metformin.  She has appt to establish care with PCP in Oct 2023  Goals of care, counseling/discussion Discussed with patient.   Orders Placed This Encounter  Procedures   CBC with Differential/Platelet    Standing Status:   Future    Standing Expiration Date:   12/14/2022   Comprehensive metabolic panel    Standing Status:   Future    Standing Expiration Date:   12/13/2022   Cancer antigen 15-3    Standing Status:   Future    Standing Expiration Date:   12/14/2022   Cancer antigen 27.29    Standing Status:   Future    Standing Expiration Date:   12/14/2022   Hemoglobin A1c    Standing Status:   Future    Standing Expiration Date:   12/13/2022   Ambulatory referral to Radiation Oncology    Referral Priority:   Routine    Referral Type:   Consultation    Referral Reason:   Specialty Services Required    Requested Specialty:   Radiation Oncology    Number of Visits Requested:   1   Follow up Lab MD 1 months.  [name], questions or concerns.  Earlie Server, MD, PhD [facility] Hematology Oncology 12/13/2021   CHIEF COMPLAINTS/REASON FOR VISIT:  bilateral breast cancer  HISTORY OF PRESENTING ILLNESS:   Sharon Woods is a  61 y.o.  female presents for follow up of bilateral breast cancer  Oncology History  Malignant neoplasm of left breast ([place])  08/06/2021 Mammogram   08/06/2021, digital bilateral mammogram and ultrasound showed left breast 3:00 mass, 1.7 x 1.7 x 1.7 cm, ultrasound of  the left axillary is negative. 08/21/2021 right breast 1 x 0.7 x 0.8 cm angulated spiculated mass at the right breast 7:00, 5 cm from nipple.  Ultrasound of the right axillary demonstrates 3 abnormal thickened cortex lymph node. 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 ER+, PR+, HER 2- T1c - This was found to be  concordant by Dr. Franki Cabot right breast 7:00, invasive mammary carcinoma with lobular features, in situ carcinoma,  ER+, PR+, HER 2-This was found to be concordant by Dr. Franki Cabot. right axilla lymph node +, extracapsular extension.  -This was found to be concordant by Dr. Franki Cabot   08/28/2021 Initial Diagnosis   Malignant neoplasm of upper-outer quadrant of left breast in female, estrogen receptor positive ([place])   08/28/2021 Imaging   CT CHEST, ABDOMEN, AND PELVIS WITH CONTRAST Asymmetric mildly enlarged right axillary nodes. Correlate with biopsy. There are some punctate foci of sclerosis in the included spine that could reflect subtle metastatic disease. Nonobstructing bilateral renal calculi.   08/29/2021 Imaging   BILATERAL BREAST MRI WITH AND WITHOUT CONTRAST 1. 2.5 centimeter enhancing mass in the LOWER OUTER QUADRANT of the RIGHT breast, correlating with known malignancy. 2. At least 4 enlarged RIGHT axillary lymph nodes, correlating well with recently biopsied lymph node showing metastatic disease. 3. 3.4 centimeter enhancing mass in the LEFT breast, with an additional 2.9 centimeters of non mass enhancement posterior to the mass also suspicious for malignancy. This likely correlates with the area calcifications seen mammographically. 4. 5 millimeters satellite nodule along the LATERAL aspect of known malignancy in the LOWER OUTER QUADRANT of the LEFT breast. 5. LEFT axilla is negative for adenopathy.       09/05/2021 Imaging   NUCLEAR MEDICINE WHOLE BODY BONE SCAN Uptake at L2 which is nonspecific but may be related to advanced degenerative disc and facet disease changes at both L1-L2 and L2-L3; no evidence of osseous metastatic disease by CT. No definite osseous metastatic lesions identified.    Genetic Testing   Negative genetic testing. No pathogenic variants identified on the [facility] CancerNext-Expanded+RNA panel. The report date is 10/03/2021.  The  CancerNext-Expanded + RNAinsight gene panel offered by [employer] and includes sequencing and rearrangement analysis for the following 77 genes: IP, ALK, APC*, ATM*, AXIN2, BAP1, BARD1, BLM, BMPR1A, BRCA1*, BRCA2*, BRIP1*, CDC73, CDH1*,CDK4, CDKN1B, CDKN2A, CHEK2*, CTNNA1, DICER1, FANCC, FH, FLCN, GALNT12, KIF1B, LZTR1, MAX, MEN1, MET, MLH1*, MSH2*, MSH3, MSH6*, MUTYH*, NBN, NF1*, NF2, NTHL1, PALB2*, PHOX2B, PMS2*, POT1, PRKAR1A, PTCH1, PTEN*, RAD51C*, RAD51D*,RB1, RECQL, RET, SDHA, SDHAF2, SDHB, SDHC, SDHD, SMAD4, SMARCA4, SMARCB1, SMARCE1, STK11, SUFU, TMEM127, TP53*,TSC1, TSC2, VHL and XRCC2 (sequencing and deletion/duplication); EGFR, EGLN1, HOXB13, KIT, MITF, PDGFRA, POLD1 and POLE (sequencing only); EPCAM and GREM1 (deletion/duplication only).   11/15/2021 Surgery   She underwent left simple mastectomy and right modified mastectomy.   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  pT2 pN1a, ER +90%, PR +90%, HER2 negative (1+)  # Right Breast invasive lobular carcinoma Grade 2, negative margins, right axillary lymph nodes  30/32 involved with metastatic carcinoma, pT1c pN3a ER +90%, PR +51-90%, HER2 negative (1+)    11/25/2021 Cancer Staging   Staging form: Breast, AJCC 8th Edition - Pathologic stage from 11/25/2021: Stage IB (pT2, pN1, cM0, G2, ER+, PR+, HER2-) -  Signed by Earlie Server, MD on 11/25/2021 Stage prefix: Initial diagnosis Multigene prognostic tests performed: None Histologic grading system: 3 grade system   Malignant neoplasm of lower-outer quadrant of right breast of female, estrogen receptor positive ([place])  08/06/2021 Mammogram   08/06/2021, digital bilateral mammogram and ultrasound showed left breast 3:00 mass, 1.7 x 1.7 x 1.7 cm, ultrasound of the left axillary is  negative. 08/21/2021 right breast 1 x 0.7 x 0.8 cm angulated spiculated mass at the right breast 7:00, 5 cm from nipple.  Ultrasound of the right axillary demonstrates 3 abnormal thickened cortex lymph node. 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 Franki Cabot right breast 7:00, invasive mammary carcinoma with lobular features, in situ carcinoma,  ER+, PR+, HER 2-This was found to be concordant by Dr. Franki Cabot. right axilla lymph node +, extracapsular extension.  -This was found to be concordant by Dr. Franki Cabot   08/28/2021 Initial Diagnosis   Malignant neoplasm of lower-outer quadrant of right breast of female, estrogen receptor positive ([place])   08/28/2021 Imaging   CT CHEST, ABDOMEN, AND PELVIS WITH CONTRAST Asymmetric mildly enlarged right axillary nodes. Correlate with biopsy. There are some punctate foci of sclerosis in the included spine that could reflect subtle metastatic disease. Nonobstructing bilateral renal calculi.   08/29/2021 Imaging   BILATERAL BREAST MRI WITH AND WITHOUT CONTRAST 1. 2.5 centimeter enhancing mass in the LOWER OUTER QUADRANT of the RIGHT breast, correlating with known malignancy. 2. At least 4 enlarged RIGHT axillary lymph nodes, correlating well with recently biopsied lymph node showing metastatic disease. 3. 3.4 centimeter enhancing mass in the LEFT breast, with an additional 2.9 centimeters of non mass enhancement posterior to the mass also suspicious for malignancy. This likely correlates with the area calcifications seen mammographically. 4. 5 millimeters satellite nodule along the LATERAL aspect of known malignancy in the LOWER OUTER QUADRANT of the LEFT breast. 5. LEFT axilla is negative for adenopathy.       09/05/2021 Imaging   NUCLEAR MEDICINE WHOLE BODY BONE SCAN Uptake at  L2 which is nonspecific but may be related to advanced degenerative disc and facet disease changes at both L1-L2 and L2-L3; no evidence of osseous metastatic disease by CT. No definite osseous metastatic lesions identified.   09/09/2021 Oncotype testing   [id number] block RIGHT 7:00 5 CM FN; ULTRASOUND-GUIDED BIOPSY Oncotype Dx RS score 22    Genetic Testing   Negative genetic testing. No pathogenic variants identified on the [facility] CancerNext-Expanded+RNA panel. The report date is 10/03/2021.  The CancerNext-Expanded + RNAinsight gene panel offered by [employer] and includes sequencing and rearrangement analysis for the following 77 genes: IP, ALK, APC*, ATM*, AXIN2, BAP1, BARD1, BLM, BMPR1A, BRCA1*, BRCA2*, BRIP1*, CDC73, CDH1*,CDK4, CDKN1B, CDKN2A, CHEK2*, CTNNA1, DICER1, FANCC, FH, FLCN, GALNT12, KIF1B, LZTR1, MAX, MEN1, MET, MLH1*, MSH2*, MSH3, MSH6*, MUTYH*, NBN, NF1*, NF2, NTHL1, PALB2*, PHOX2B, PMS2*, POT1, PRKAR1A, PTCH1, PTEN*, RAD51C*, RAD51D*,RB1, RECQL, RET, SDHA, SDHAF2, SDHB, SDHC, SDHD, SMAD4, SMARCA4, SMARCB1, SMARCE1, STK11, SUFU, TMEM127, TP53*,TSC1, TSC2, VHL and XRCC2 (sequencing and deletion/duplication); EGFR, EGLN1, HOXB13, KIT, MITF, PDGFRA, POLD1 and POLE (sequencing only); EPCAM and GREM1 (deletion/duplication only).   11/15/2021 Surgery   She underwent left simple mastectomy and right modified mastectomy.   Pathology # Left breast, invasive lobular carcinoma Grade 2, with back ground neoplasia [LCIS and atypical lobular hyperplasia],  benign intraductal papilloma, negative margins, Left axillary SLNB 2/2 involved with metastatic carcinoma, extranodal extension.  pT2 pN1a, ER +90%, PR +90%, HER2 negative (1+)  # Right Breast invasive lobular carcinoma Grade 2, negative margins, right axillary lymph nodes  30/32 involved with metastatic carcinoma, pT1c pN3a ER +90%, PR +51-90%, HER2 negative (1+)    11/25/2021 Cancer Staging   Staging form: Breast, AJCC 8th  Edition - Pathologic stage from 11/25/2021: Stage IIIA (pT1c, pN3a, cM0, G2, ER+, PR+, HER2-) - Signed by Earlie Server, MD on 11/25/2021 Stage prefix: Initial diagnosis Multigene prognostic tests performed: None Histologic grading system: 3 grade system   12/10/2021 Imaging   PET scan  1. Interval bilateral mastectomy and right axillary node dissection.No evidence of residual disease in the chest wall, nodal metastases or distant metastases. 2. Focal hypermetabolic activity in the proximal rectum corresponding with an intraluminal polypoid lesion, suspicious for a villous adenoma or early colon cancer. Sigmoidoscopy/colonoscopy recommended unless recently performed    She is a poor historian. History of major depression, psychosis, previously on olanzapine and Remeron not currently on any and if not currently following up with psychiatrist. Patient's family history is positive for sister with breast cancer   INTERVAL HISTORY Sharon Woods is a 61 y.o. female who has above history reviewed by me today presents for follow up visit for management of bilateral breast cancer The patient was accompanied by her son. She has had bilateral mastectomy.  Poor historian. She had serous drainage and recently finished a course of antibiotics for cellulitis.  No fever chills.      Review of Systems  Constitutional:  Negative for appetite change, chills, fatigue and fever.  HENT:   Negative for hearing loss and voice change.   Eyes:  Negative for eye problems.  Respiratory:  Negative for chest tightness and cough.   Cardiovascular:  Negative for chest pain.  Gastrointestinal:  Negative for abdominal distention, abdominal pain and blood in stool.  Endocrine: Negative for hot flashes.  Genitourinary:  Negative for difficulty urinating and frequency.   Musculoskeletal:  Negative for arthralgias.  Skin:  Negative for itching and rash.  Neurological:  Negative for extremity weakness.  Hematological:   Negative for adenopathy.  Psychiatric/Behavioral:  Negative for confusion.   Soreness of the sites of mastectomy  MEDICAL HISTORY:  Past Medical History:  Diagnosis Date   Anxiety    Depression    Diabetes mellitus without complication ([place])    History of high cholesterol 2000   Hypertension    Patient denies medical problems    Thrombocytopenia ([place])     SURGICAL HISTORY: Past Surgical History:  Procedure Laterality Date   ABDOMINAL HYSTERECTOMY     AXILLARY SENTINEL NODE BIOPSY Left 11/15/2021   Procedure: AXILLARY SENTINEL NODE BIOPSY;  Surgeon: [name], MD;  Location: [facility] ORS;  Service: General;  Laterality: Left;   BREAST BIOPSY Right 08/21/2021   [place] bx 7:00 mass coil clip path pending   BREAST BIOPSY Right 08/21/2021   [place] bx of LN, hydro marker, path pending   BREAST BIOPSY Left 08/21/2021   [place] bx, heart marker, path pending   CESAREAN SECTION     x2   MASTECTOMY MODIFIED RADICAL Right 11/15/2021   Procedure: MASTECTOMY MODIFIED RADICAL;  Surgeon: [name], MD;  Location: [facility] ORS;  Service: General;  Laterality: Right;   NO PAST SURGERIES     TOTAL MASTECTOMY Left 11/15/2021   Procedure: TOTAL MASTECTOMY;  Surgeon: [name], MD;  Location:  [facility] ORS;  Service: General;  Laterality: Left;    SOCIAL HISTORY: Social History   Socioeconomic History   Marital status: Married    Spouse name: Not on file   Number of children: Not on file   Years of education: Not on file   Highest education level: Not on file  Occupational History   Not on file  Tobacco Use   Smoking status: Former    Types: Cigarettes    Quit date: 2013    Years since quitting: 10.7   Smokeless tobacco: Never  Vaping Use   Vaping Use: Never used  Substance and Sexual Activity   Alcohol use: Yes    Comment: occasionally   Drug use: No   Sexual activity: Not Currently    Comment: unable to assess   Other Topics Concern   Not on file  Social History Narrative   Not  on file   Social Determinants of Health   Financial Resource Strain: Medium Risk (10/10/2021)   Overall Financial Resource Strain (CARDIA)    Difficulty of Paying Living Expenses: Somewhat hard  Food Insecurity: No Food Insecurity (10/10/2021)   Hunger Vital Sign    Worried About Running Out of Food in the Last Year: Never true    Ran Out of Food in the Last Year: Never true  Transportation Needs: Unmet Transportation Needs (09/16/2021)   PRAPARE - Transportation    Lack of Transportation (Medical): Yes    Lack of Transportation (Non-Medical): Yes  Physical Activity: Inactive (10/10/2021)   Exercise Vital Sign    Days of Exercise per Week: 0 days    Minutes of Exercise per Session: 0 min  Stress: Stress Concern Present (10/10/2021)   [place]    Feeling of Stress : To some extent  Social Connections: Socially Isolated (10/10/2021)   Social Connection and Isolation Panel [NHANES]    Frequency of Communication with Friends and Family: Once a week    Frequency of Social Gatherings with Friends and Family: Never    Attends Religious Services: Never    Printmaker: No    Attends Music therapist: Never    Marital Status: Married  Human resources officer Violence: Not on file    FAMILY HISTORY: Family History  Problem Relation Age of Onset   Lung cancer Mother    Dementia Mother    Parkinson's disease Father    Cancer Father        unk type   Diabetes Sister    Heart attack Sister    Breast cancer Sister    Cancer Maternal Uncle        unk types   Dementia Maternal Grandmother    Cancer Maternal Grandmother        unk type   Cancer Other    Dementia Other     ALLERGIES:  is allergic to tylenol [acetaminophen] and aleve [naproxen].  MEDICATIONS:  Current Outpatient Medications  Medication Sig Dispense Refill   ACCU-CHEK GUIDE test strip USE TO CHECK BLOOD SUGAR UP TO 4  TIMES DAILY AS DIRECTED 100 each 0   Accu-Chek Softclix Lancets lancets USE TO CHECK BLOOD SUAGR UP TO 4 TIMES DAILY AS DIRECTED 100 each 0   blood glucose meter kit and supplies KIT Dispense based on patient and insurance preference. Use up to four times daily as directed. 1 each 1   glipiZIDE (GLUCOTROL) 5 MG tablet Take 1 tablet (5  mg total) by mouth daily before breakfast. 30 tablet 1   metFORMIN (GLUCOPHAGE) 500 MG tablet Take 1 tablet (500 mg total) by mouth daily. 90 tablet 1   oxyCODONE (OXY IR/ROXICODONE) 5 MG immediate release tablet Take 1 tablet (5 mg total) by mouth every 6 (six) hours as needed for up to 24 doses for severe pain or breakthrough pain. 24 tablet 0   triamcinolone cream (KENALOG) 0.1 % Apply 1 Application topically 2 (two) times daily. 80 g 0   calcium carbonate (TUMS - DOSED IN MG ELEMENTAL CALCIUM) 500 MG chewable tablet Chew 1 tablet by mouth daily. (Patient not taking: Reported on 12/13/2021)     No current facility-administered medications for this visit.     PHYSICAL EXAMINATION: ECOG PERFORMANCE STATUS: 1 - Symptomatic but completely ambulatory Vitals:   12/13/21 1128  BP: 137/76  Pulse: 70  Resp: 18  Temp: (!) 96.8 F (36 C)   Filed Weights   12/13/21 1128  Weight: 169 lb (76.7 kg)    Physical Exam Constitutional:      General: She is not in acute distress. HENT:     Head: Normocephalic and atraumatic.  Eyes:     General: No scleral icterus. Cardiovascular:     Rate and Rhythm: Normal rate.  Pulmonary:     Effort: Pulmonary effort is normal. No respiratory distress.     Breath sounds: No wheezing.  Abdominal:     General: There is no distension.  Musculoskeletal:        General: No deformity. Normal range of motion.     Cervical back: Normal range of motion and neck supple.  Skin:    Coloration: Skin is not jaundiced.  Neurological:     Mental Status: She is alert and oriented to person, place, and time. Mental status is at baseline.   Psychiatric:     Comments: Tearful     LABORATORY DATA:  I have reviewed the data as listed Lab Results  Component Value Date   WBC 4.2 10/21/2021   HGB 11.9 (L) 11/17/2021   HCT 36.8 11/17/2021   MCV 90.8 10/21/2021   PLT 81 (L) 10/21/2021   Recent Labs    08/28/21 1143 10/08/21 1201 10/21/21 1025 11/04/21 1326 12/05/21 1308  NA 136 138 137 140 143  K 3.7 3.4* 3.8 3.6 3.6  CL 103 107 109 106 109  CO2 _0 GLUCOSE 271* 159* 176* 158* 203*  BUN _1 CREATININE 0.67 0.58 0.60 0.70 0.71  CALCIUM 9.0 8.9 8.8* 9.3 9.1  GFRNONAA >60 >60 >60 >60 >60  PROT 8.2* 7.6 7.5  --   --   ALBUMIN 4.1 4.1 3.9  --   --   AST _2 --   --   ALT _3 --   --   ALKPHOS 83 68 62  --   --   BILITOT 0.6 1.0 0.5  --   --     Iron/TIBC/Ferritin/ %Sat No results found for: "IRON", "TIBC", "FERRITIN", "IRONPCTSAT"    RADIOGRAPHIC STUDIES: I have personally reviewed the radiological images as listed and agreed with the findings in the report. NM PET Image Initial (PI) Skull Base To Thigh  Result Date: 12/10/2021 CLINICAL DATA:  Initial treatment strategy for recently diagnosed bilateral breast cancer. EXAM: NUCLEAR MEDICINE PET SKULL BASE TO THIGH TECHNIQUE: 9.34 mCi F-18 FDG was injected intravenously. Full-ring PET imaging was performed from the  skull base to thigh after the radiotracer. CT data was obtained and used for attenuation correction and anatomic localization. Fasting blood glucose: 93 mg/dl COMPARISON:  Whole-body bone scan 09/04/2021, CT of the chest, abdomen and pelvis 08/28/2021 and abdominopelvic CT 12/04/2020. FINDINGS: Mediastinal blood pool activity: SUV max 1.9 NECK: No hypermetabolic cervical lymph nodes are identified.Fairly symmetric activity within the lymphoid tissue of Waldeyer's ring is within physiologic limits. No suspicious activity identified within the pharyngeal mucosal space. Incidental CT findings: none CHEST: Interval bilateral  mastectomy. Low level activity within the right axilla corresponding with ill-defined low-density attributed to postsurgical change (SUV max 2.7). No suspicious chest wall activity or residual axillary adenopathy. There are no hypermetabolic mediastinal, hilar, axillary or internal mammary lymph nodes. No hypermetabolic pulmonary activity or suspicious nodularity. Incidental CT findings: Interval bilateral mastectomy with a fluid collection or tissue expander in the left breast measuring 6.9 x 2.7 cm on image 104/2. ABDOMEN/PELVIS: There is no hypermetabolic activity within the liver, adrenal glands, spleen or pancreas. There is no hypermetabolic nodal activity in the abdomen or pelvis. There is focally intense (SUV max 6.3) metabolic activity within the proximal rectum, corresponding with a 1.3 cm soft tissue density on image 211/2. No other suspicious bowel activity. Incidental CT findings: Stable mild splenomegaly. Nonobstructing bilateral renal calculi. Mild aortic and branch vessel atherosclerosis. Previous hysterectomy. SKELETON: There is no hypermetabolic activity to suggest osseous metastatic disease. Incidental CT findings: Degenerative changes in the spine associated with a convex right thoracolumbar scoliosis. Punctate sclerotic lesions in the spine are unchanged and likely incidental bone islands. IMPRESSION: 1. Interval bilateral mastectomy and right axillary node dissection. No evidence of residual disease in the chest wall, nodal metastases or distant metastases. 2. Focal hypermetabolic activity in the proximal rectum corresponding with an intraluminal polypoid lesion, suspicious for a villous adenoma or early colon cancer. Sigmoidoscopy/colonoscopy recommended unless recently performed. Electronically Signed   By: Richardean Sale M.D.   On: 12/10/2021 13:28   NM Sentinel Node Inj-No Rpt (Breast)  Result Date: 11/15/2021 Sulfur Colloid was injected by the Nuclear Medicine Technologist for sentinel  lymph node localization.

## 2021-12-13 NOTE — Assessment & Plan Note (Signed)
See above plan. 

## 2021-12-13 NOTE — Assessment & Plan Note (Signed)
Not on any antidepressants.  Referred to psychiatrist. She has not established care 

## 2021-12-13 NOTE — Assessment & Plan Note (Addendum)
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  pT2 pN1a, ER +90%, PR +90%, HER2 negative (1+)  # Right Breast invasive lobular carcinoma Grade 2, negative margins, right axillary lymph nodes  30/32 involved with metastatic carcinoma, pT1c pN3a ER +90%, PR +51-90%, HER2 negative (1+)  Patient has poor insights of her condition. Not a candidate for adjuvant IV chemotherapy. Refer to RadOnc for Adjuvant radiation.  Plan adjuvant endocrine therapy with CKD 4/5 inhibitors.

## 2021-12-13 NOTE — Assessment & Plan Note (Signed)
Discussed with patient

## 2021-12-18 ENCOUNTER — Ambulatory Visit: Payer: Medicaid Other | Attending: Surgery | Admitting: Physical Therapy

## 2021-12-18 ENCOUNTER — Encounter: Payer: Self-pay | Admitting: Physical Therapy

## 2021-12-18 DIAGNOSIS — M25611 Stiffness of right shoulder, not elsewhere classified: Secondary | ICD-10-CM | POA: Insufficient documentation

## 2021-12-18 DIAGNOSIS — M546 Pain in thoracic spine: Secondary | ICD-10-CM | POA: Insufficient documentation

## 2021-12-18 DIAGNOSIS — Z9013 Acquired absence of bilateral breasts and nipples: Secondary | ICD-10-CM | POA: Diagnosis not present

## 2021-12-18 DIAGNOSIS — M25612 Stiffness of left shoulder, not elsewhere classified: Secondary | ICD-10-CM | POA: Diagnosis not present

## 2021-12-18 NOTE — Therapy (Signed)
OUTPATIENT PHYSICAL THERAPY SHOULDER EVALUATION   Patient Name: Sharon Woods MRN: [id number] DOB:12/18/1960, 61 y.o., female Today's Date: 12/20/2021    Past Medical History:  Diagnosis Date   Anxiety    Depression    Diabetes mellitus without complication ([place])    History of high cholesterol 2000   Hypertension    Patient denies medical problems    Thrombocytopenia ([place])    Past Surgical History:  Procedure Laterality Date   ABDOMINAL HYSTERECTOMY     AXILLARY SENTINEL NODE BIOPSY Left 11/15/2021   Procedure: AXILLARY SENTINEL NODE BIOPSY;  Surgeon: [name], MD;  Location: [facility]S;  Service: General;  Laterality: Left;   BREAST BIOPSY Right 08/21/2021   [place] bx 7:00 mass coil clip path pending   BREAST BIOPSY Right 08/21/2021   [place] bx of LN, hydro marker, path pending   BREAST BIOPSY Left 08/21/2021   [place] bx, heart marker, path pending   CESAREAN SECTION     x2   MASTECTOMY MODIFIED RADICAL Right 11/15/2021   Procedure: MASTECTOMY MODIFIED RADICAL;  Surgeon: [name], MD;  Location: [facility];  Service: General;  Laterality: Right;   NO PAST SURGERIES     TOTAL MASTECTOMY Left 11/15/2021   Procedure: TOTAL MASTECTOMY;  Surgeon: [name], MD;  Location: [facility];  Service: General;  Laterality: Left;   Patient Active Problem List   Diagnosis Date Noted   Diabetes mellitus ([place]) 12/05/2021   Status post bilateral mastectomy 11/21/2021   Bilateral breast cancer ([place]) 11/15/2021   Genetic testing 10/07/2021   Vaginitis 09/28/2021   Thrombocytopenia ([place]) 09/08/2021   Malignant neoplasm of left breast ([place]) 08/28/2021   Malignant neoplasm of lower-outer quadrant of right breast of female, estrogen receptor positive ([place]) 08/28/2021   Goals of care, counseling/discussion 08/28/2021   Severe recurrent major depression with psychotic features ([place]) 11/15/2015   Hypertension 11/15/2015   Noncompliance 11/15/2015   Severe major depression, single  episode, with psychotic features, mood-congruent ([place]) 03/29/2015   Protein-calorie malnutrition, severe 03/26/2015   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    PCP: No PCP  REFERRING PROVIDER: [name] MD  REFERRING DIAG: Decreased bilat shoulder ROM post double masectomy  THERAPY DIAG:  Stiffness of right shoulder, not elsewhere classified - Plan: PT plan of care cert/re-cert  Stiffness of left shoulder, not elsewhere classified - Plan: PT plan of care cert/re-cert  Pain in thoracic spine - Plan: PT plan of care cert/re-cert  Rationale for Evaluation and Treatment Rehabilitation  ONSET DATE: August 18th 2023  SUBJECTIVE:  SUBJECTIVE STATEMENT: 11/05/16 L simple mastectomy and R modified mastectomy following stage 2 breast cancer diagnosis with lymphnode involvement  PERTINENT HISTORY: Pt is a 61 year old female presenting with bilat decreased shoulder ROM following double masectomy 11/15/21. Not currently under any cancer treatments, is seeking further treatments. No lymphedema, or treatment for this at this time. Reports since surgery she has decreased overhead motion. She has bilat pain at the axilla of her Ues. Pain currently 5/10; lowest pain score 3/10; worst pain 9/10. The pain she feels is sharp and she also has numbness/tingling in the axilla across the top of bilat breasts. Pain is aggravated by reaching overhead, reaching behind her back, pulling, pushing... really anything I do with my arms. Laying on her back hurts as well. Pain is relieved by tylenol, pain medication , and ice. 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 Patient does not have a license or vehicle, relies on family and public transit. Pt denies N/V, B&B changes,  unexplained weight fluctuation, saddle paresthesia, fever, night sweats, or unrelenting night pain at this time.  Pt poor historian, seems to not have understanding of current condition.  PMH gathered from chart review: 11/05/16 L simple mastectomy and R modified mastectomy following stage 2 breast cancer diagnosis with lymphnode involvement; recent cellulitis from drainage with course of antibiotics.   PAIN:  Are you having pain? Yes: NPRS scale: 5/10 Pain location: bilat axilla Pain description: tingling, sharp Aggravating factors: reaching overhead, reaching behind her back, pulling, pushing... really anything I do with my arms. Laying on her back hurts as well.  Relieving factors: tylenol, pain medication , and ice  PRECAUTIONS: None  WEIGHT BEARING RESTRICTIONS No  FALLS:  Has patient fallen in last 6 months? 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  PLOF: Independent  PATIENT GOALS be back to where I was like I never had surgery  OBJECTIVE:   DIAGNOSTIC FINDINGS:    PATIENT SURVEYS:  FOTO 46 goal of 43  COGNITION:  Overall cognitive status: Within functional limits for tasks assessed     SENSATION: WFL impaired at scar of bilat breasts  Observation: no drainage from scars; with palpation evident care tissue bilat scars Scapular dyskinesis with attempt of overhead flexion bilat; heavy shoulder hiking  POSTURE: Increased thoracic kyphosis, forward head, rounded and elevatedshoulders  UPPER EXTREMITY ROM:   Active /PROM Right eval Left eval  Shoulder flexion 104/146 106/144  Shoulder extension WNL WNL  Shoulder abduction 92/106 119/135  Shoulder adduction    Shoulder internal rotation ([place]) T12/WNL T12/WNL  Shoulder external  rotation ([place]leys) C5/WNL CTJ/WNL  Elbow flexion WNL WNL  Elbow extension WNL WNL  Wrist flexion    Wrist extension    Wrist ulnar deviation    Wrist radial deviation    Wrist pronation    Wrist supination    (Blank rows = not tested)  UPPER EXTREMITY MMT:  All bilat shoulder MMT 5/5 In available ROM R/L Y lower trap 3+/4- T Scapular retractors 4-/4  SHOULDER SPECIAL TESTS:  Impingement tests: Neer impingement test: positive , Hawkins/Kennedy impingement test: positive , and Painful arc test: positive   SLAP lesions: Biceps load test: negative  Instability tests: Apprehension  test: negative  Rotator cuff assessment: Drop arm test: negative, Empty can test: negative, Full can test: negative, External rotation lag sign: negative, Internal rotation lag sign: negative, and Infraspinatus test: negative  Biceps assessment: Speed's test: negative  JOINT MOBILITY TESTING:  GHJ mobility WNL with guarding throughout R>L  PALPATION:  TTP at bilat pec minor with tension Pain/numbness at surgical incision sites; scar tissue evident     TODAY'S TREATMENT:  Patient was educated on diagnosis, anatomy and pathology involved, prognosis, role of PT, and was given an HEP, demonstrating exercise with proper form following verbal and tactile cues, and was given a paper hand out to continue exercise at home. Pt was educated on and agreed to plan of care. Access Code: [email or web address] - Seated Thoracic Lumbar Extension with Pectoralis Stretch  - 3 x daily - 7 x weekly - 12 reps - 2sec hold - Seated Shoulder Abduction AAROM with Pulley Behind  - 3 x daily - 7 x weekly - 12 reps - 2sec hold - Seated Shoulder Flexion AAROM with Pulley Behind  - 3 x daily - 7 x weekly - 12 reps - 2sec hold  Educated on daily scar massage  PATIENT EDUCATION: Education details: Patient was educated on diagnosis, anatomy and pathology involved, prognosis, role of PT, and was given an HEP, demonstrating exercise with proper form  following verbal and tactile cues, and was given a paper hand out to continue exercise at home. Pt was educated on and agreed to plan of care.  Person educated: Patient Education method: Explanation, Demonstration, and Handouts Education comprehension: verbalized understanding, returned demonstration, and tactile cues required   HOME EXERCISE PROGRAM: [email or web address]  ASSESSMENT:  CLINICAL IMPRESSION: Patient is a 61 y.o. female who was seen today for physical therapy evaluation and treatment for decreased bilat shoulder mobility following bilat mastectomy 11/15/21; evidence of bilat ac joint impingement of pec minor, probably supraspinatus. Evident impairments in decreased shoulder mobility bilat, decreased thoracic mobility, abnormal posture, decreased RTC and periscapular strength, and pain. Activity limitations in overhead reaching, lifting, pushing, pulling, reaching behind the back, and holding;inhibiting full participation in ADLs. Would benefit from skilled PT to address above deficits and promote optimal return to PLOF.    OBJECTIVE IMPAIRMENTS decreased activity tolerance, decreased coordination, decreased endurance, decreased mobility, decreased ROM, decreased strength, increased muscle spasms, impaired flexibility, impaired tone, impaired UE functional use, improper body mechanics, postural dysfunction, and pain.   ACTIVITY LIMITATIONS carrying, lifting, sleeping, transfers, bathing, dressing, reach over head, and hygiene/grooming  PARTICIPATION LIMITATIONS: meal prep, cleaning, laundry, shopping, community activity, and occupation  PERSONAL FACTORS Age, Education, Fitness, Past/current experiences, Sex, Social background, Time since onset of injury/illness/exacerbation, Transportation, and 3+ comorbidities: A&D, DB, HTN, history of cancer  are also affecting patient's functional outcome.   REHAB POTENTIAL: Good  CLINICAL DECISION MAKING: Evolving/moderate complexity  EVALUATION  COMPLEXITY: Moderate   GOALS: Goals reviewed with patient? Yes  SHORT TERM GOALS: Target date: 01/17/2022  (Remove Blue Hyperlink)  Pt will be independent with HEP in order to improve strength and balance in order to decrease fall risk and improve function at home and work. Baseline: 12/18/21 HEP given  Goal status: INITIAL    LONG TERM GOALS: Target date: 02/14/2022  (Remove Blue Hyperlink)  Pt will decrease worst pain as reported on NPRS by at least 3 points in order to demonstrate clinically significant reduction in pain.  Baseline: 12/21/21 9/10 Goal status: INITIAL  2.  Patient will increase FOTO score to 61 to demonstrate  predicted increase in functional mobility to complete ADLs  Baseline: 12/21/21 40 Goal status: INITIAL  3.  Pt will demonstrate 4+/5 gross periscapular MMT in order to complete heavy household ADLs Baseline: 12/21/21 Y lower trap 3+/4- ; T Scapular retractors 4-/4 Goal status: INITIAL  4.  Pt will demonstrate full shoulder ROM in order to complete household and self care ADLs Baseline: R/L flex 104/106 abd: 92/119 ER: C5/WNL Goal status: INITIAL    PLAN: PT FREQUENCY: 2x/week  PT DURATION: 8 weeks  PLANNED INTERVENTIONS: Therapeutic exercises, Therapeutic activity, Neuromuscular re-education, Balance training, Gait training, Patient/Family education, Self Care, Joint mobilization, Joint manipulation, DME instructions, Aquatic Therapy, Dry Needling, Electrical stimulation, Spinal manipulation, Spinal mobilization, Cryotherapy, Moist heat, Traction, Ultrasound, Manual therapy, and Re-evaluation  PLAN FOR NEXT SESSION: HEP review   Durwin Reges DPT Durwin Reges, PT 12/20/2021, 10:14 AM

## 2021-12-19 ENCOUNTER — Ambulatory Visit (INDEPENDENT_AMBULATORY_CARE_PROVIDER_SITE_OTHER): Payer: Medicaid Other | Admitting: Surgery

## 2021-12-19 ENCOUNTER — Other Ambulatory Visit: Payer: Self-pay

## 2021-12-19 ENCOUNTER — Encounter: Payer: Self-pay | Admitting: Surgery

## 2021-12-19 VITALS — BP 144/78 | HR 71 | Temp 98.1°F | Ht 61.0 in | Wt 168.0 lb

## 2021-12-19 DIAGNOSIS — Z08 Encounter for follow-up examination after completed treatment for malignant neoplasm: Secondary | ICD-10-CM

## 2021-12-19 DIAGNOSIS — Z9013 Acquired absence of bilateral breasts and nipples: Secondary | ICD-10-CM

## 2021-12-19 DIAGNOSIS — C50511 Malignant neoplasm of lower-outer quadrant of right female breast: Secondary | ICD-10-CM

## 2021-12-19 DIAGNOSIS — C50412 Malignant neoplasm of upper-outer quadrant of left female breast: Secondary | ICD-10-CM

## 2021-12-19 NOTE — Progress Notes (Signed)
[facility] POST-OP OFFICE VISIT  12/19/2021  HPI: Sharon Woods is a 61 y.o. female status post bilateral mastectomies for advanced bilateral disease, right worse than left.  She complains of some dermal itching where the Steri-Strips remain.  They are ready to be removed and remove them today.  She denies any fevers or chills.  She does complain of a little pooching under her left axilla.  And she does have a bit of a seroma under the left side which does not seem to produce significant symptoms.  Her flaps appear clean and dry and intact without evidence of infection.  Vital signs: BP (!) 144/78   Pulse 71   Temp 98.1 F (36.7 C) (Oral)   Ht '5\' 1"'$  (1.549 m)   Wt 168 lb (76.2 kg)   LMP  (LMP Unknown) Comment: Patient is not a good historian  SpO2 98%   BMI 31.74 kg/m    Physical Exam: Constitutional: She appears well, back at her baseline.  Skin: See the review as noted under HPI.  Assessment/Plan: This is a 61 y.o. female s/p bilateral mastectomies for advanced disease, not a chemotherapy candidate, I believe she is going to be evaluated for XRT.  As her seroma appears to be sterile at present, and minimally symptomatic, I will leave it alone without aspiration.  Should that change we will readily see her again and assist with care.  Patient Active Problem List   Diagnosis Date Noted   Diabetes mellitus ([place]) 12/05/2021   Status post bilateral mastectomy 11/21/2021   Bilateral breast cancer ([place]) 11/15/2021   Genetic testing 10/07/2021   Vaginitis 09/28/2021   Thrombocytopenia ([place]) 09/08/2021   Malignant neoplasm of left breast ([place]) 08/28/2021   Malignant neoplasm of lower-outer quadrant of right breast of female, estrogen receptor positive ([place]) 08/28/2021   Goals of care, counseling/discussion 08/28/2021   Severe recurrent major depression with psychotic features ([place]) 11/15/2015   Hypertension 11/15/2015   Noncompliance 11/15/2015   Severe major  depression, single episode, with psychotic features, mood-congruent ([place]) 03/29/2015   Protein-calorie malnutrition, severe 03/26/2015   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  We remain readily available.   [name] M.D., FACS 12/19/2021, 9:09 AM

## 2021-12-19 NOTE — Patient Instructions (Addendum)
You may shower as usual. You may wash the areas with soap and water.   Please call the office with any questions or concerns.

## 2021-12-20 ENCOUNTER — Encounter: Payer: Self-pay | Admitting: Physical Therapy

## 2021-12-23 ENCOUNTER — Other Ambulatory Visit: Payer: Self-pay | Admitting: Oncology

## 2021-12-24 ENCOUNTER — Ambulatory Visit
Admission: RE | Admit: 2021-12-24 | Discharge: 2021-12-24 | Disposition: A | Discharge status: Home / Self Care | Payer: Medicaid Other | Source: Ambulatory Visit | Attending: Radiation Oncology | Admitting: Radiation Oncology

## 2021-12-24 ENCOUNTER — Encounter: Payer: Self-pay | Admitting: *Deleted

## 2021-12-24 ENCOUNTER — Encounter: Payer: Self-pay | Admitting: Radiation Oncology

## 2021-12-24 VITALS — BP 152/99 | HR 74 | Temp 98.2°F | Resp 18 | Wt 169.3 lb

## 2021-12-24 DIAGNOSIS — E119 Type 2 diabetes mellitus without complications: Secondary | ICD-10-CM | POA: Diagnosis not present

## 2021-12-24 DIAGNOSIS — C50412 Malignant neoplasm of upper-outer quadrant of left female breast: Secondary | ICD-10-CM | POA: Diagnosis not present

## 2021-12-24 DIAGNOSIS — I10 Essential (primary) hypertension: Secondary | ICD-10-CM | POA: Diagnosis not present

## 2021-12-24 DIAGNOSIS — Z170 Estrogen receptor positive status [ER+]: Secondary | ICD-10-CM | POA: Diagnosis not present

## 2021-12-24 DIAGNOSIS — C779 Secondary and unspecified malignant neoplasm of lymph node, unspecified: Secondary | ICD-10-CM | POA: Insufficient documentation

## 2021-12-24 DIAGNOSIS — Z803 Family history of malignant neoplasm of breast: Secondary | ICD-10-CM | POA: Diagnosis not present

## 2021-12-24 DIAGNOSIS — Z87891 Personal history of nicotine dependence: Secondary | ICD-10-CM | POA: Diagnosis not present

## 2021-12-24 DIAGNOSIS — E7800 Pure hypercholesterolemia, unspecified: Secondary | ICD-10-CM | POA: Diagnosis not present

## 2021-12-24 DIAGNOSIS — Z7984 Long term (current) use of oral hypoglycemic drugs: Secondary | ICD-10-CM | POA: Diagnosis not present

## 2021-12-24 DIAGNOSIS — C50411 Malignant neoplasm of upper-outer quadrant of right female breast: Secondary | ICD-10-CM | POA: Insufficient documentation

## 2021-12-24 DIAGNOSIS — D696 Thrombocytopenia, unspecified: Secondary | ICD-10-CM | POA: Insufficient documentation

## 2021-12-24 DIAGNOSIS — C50511 Malignant neoplasm of lower-outer quadrant of right female breast: Secondary | ICD-10-CM | POA: Diagnosis not present

## 2021-12-24 DIAGNOSIS — Z801 Family history of malignant neoplasm of trachea, bronchus and lung: Secondary | ICD-10-CM | POA: Diagnosis not present

## 2021-12-24 DIAGNOSIS — Z9013 Acquired absence of bilateral breasts and nipples: Secondary | ICD-10-CM | POA: Diagnosis not present

## 2021-12-24 DIAGNOSIS — Z809 Family history of malignant neoplasm, unspecified: Secondary | ICD-10-CM | POA: Diagnosis not present

## 2021-12-24 NOTE — Consult Note (Signed)
NEW PATIENT EVALUATION  Name: Sharon Woods  MRN: [id number]  Date:   12/24/2021     DOB: 01-23-1961   This 61 y.o. female patient presents to the clinic for initial evaluation of bilateral breast cancer. Left breast cancer.  Status post left mastectomy stage IIb (pT2 cN1 M0) ER/PR positive HER2 negative grade 2 invasive mammary carcinoma. Right breast status post mastectomy stage IIIc (T1 N3 aM0) grade 2 ER/PR positive HER2 negative invasive mammary carcinoma  REFERRING PHYSICIAN: No ref. provider found  CHIEF COMPLAINT:  Chief Complaint  Patient presents with   invasive breast cancer    DIAGNOSIS: The primary encounter diagnosis was Carcinoma of both nipple and areola of right breast in female, estrogen receptor positive ([place]). A diagnosis of Malignant neoplasm of upper-outer quadrant of left breast in female, estrogen receptor positive ([place]) was also pertinent to this visit.   PREVIOUS INVESTIGATIONS:  Pathology reports reviewed Mammograms and ultrasound reviewed MRI scans reviewed bone scan reviewed PET scan reviewed Clinical notes reviewed  HPI: Patient is a 61 year old female who presented with palpated mass in the left breast.  Mammogram concurred a 1.7 x 1.7 cm mass in the 3 o'clock position with ultrasound left axilla negative.  Right breast showed a 1 x 0.7 cm mass in the right breast at the 7 o'clock position 5 cm from the nipple ultrasound the right axilla demonstrated 3 abnormal thickened cortical lymph nodes.  Biopsies were performed showing a invasive mammary carcinoma with lobular features of the left breast as well as invasive mammary carcinoma with lobular features in the right breast.  Breast MRI confirmed a 2.5 cm enhancing mass in the lower outer quadrant of the right breast.  There were at least 4 enlarged right axillary lymph nodes.  There is also 3.4 cm enhancing mass in the left breast with additional 2.9 cm of non-mass enhancement posterior to the mass suspicious  for malignancy.  Left axilla was negative for adenopathy.  Bone scan showed uptake at L2 which was nonspecific but may be related to advanced degenerative disease.  She underwent bilateral mastectomies.  Left breast showed a 2.5 cm grade 2 invasive mammary carcinoma with lymphatic and vascular invasion present.  2 of 2 lymph nodes were positive for metastatic disease.  Margins were clear at 7 mm posteriorly and anteriorly.  The 2 lymph nodes had macro metastatic disease with extranodal extension present.  Tumor was ER/PR positive HER2/neu not overexpressed.  Right breast showed a 1.7 cm grade 2 invasive lung carcinoma with lymphatic vascular invasion present.  Margins were clear at 4 mm anteriorly.  There were 30 lymph nodes examined with macro metastatic disease out of 31 sampled.  Extranodal extension was present.  Tumor was again ER/PR positive HER2 not overexpressed.  She has been seen by medical oncology in past for systemic treatment.  She will have endocrine therapy after completion of radiation she is seen today for radiation collagen opinion she is doing fairly well still tender in her incision sites.  She specifically denies bone pain.  She had a postoperative PET scan showing no evidence of residual disease in the chest wall or distant metastatic disease she did have focal hypermetabolic tip in the proximal rectum corresponding to her intraluminal polypoid lesion suspicious for a villous adenoma and colonoscopy colonoscopy is being arranged. PLANNED TREATMENT REGIMEN: Bilateral chest wall and peripheral lymphatic radiation  PAST MEDICAL HISTORY:  has a past medical history of Anxiety, Depression, Diabetes mellitus without complication ([place]), History of high  cholesterol (2000), Hypertension, Patient denies medical problems, and Thrombocytopenia ([place]).    PAST SURGICAL HISTORY:  Past Surgical History:  Procedure Laterality Date   ABDOMINAL HYSTERECTOMY     AXILLARY SENTINEL NODE BIOPSY Left  11/15/2021   Procedure: AXILLARY SENTINEL NODE BIOPSY;  Surgeon: [name], MD;  Location: [facility];  Service: General;  Laterality: Left;   BREAST BIOPSY Right 08/21/2021   [place] bx 7:00 mass coil clip path pending   BREAST BIOPSY Right 08/21/2021   [place] bx of LN, hydro marker, path pending   BREAST BIOPSY Left 08/21/2021   [place] bx, heart marker, path pending   CESAREAN SECTION     x2   MASTECTOMY MODIFIED RADICAL Right 11/15/2021   Procedure: MASTECTOMY MODIFIED RADICAL;  Surgeon: [name], MD;  Location: [facility];  Service: General;  Laterality: Right;   NO PAST SURGERIES     TOTAL MASTECTOMY Left 11/15/2021   Procedure: TOTAL MASTECTOMY;  Surgeon: [name], MD;  Location: [facility];  Service: General;  Laterality: Left;    FAMILY HISTORY: family history includes Breast cancer in her sister; Cancer in her father, maternal grandmother, maternal uncle, and another family member; Dementia in her maternal grandmother, mother, and another family member; Diabetes in her sister; Heart attack in her sister; Lung cancer in her mother; Parkinson's disease in her father.  SOCIAL HISTORY:  reports that she quit smoking about 10 years ago. Her smoking use included cigarettes. She has never used smokeless tobacco. She reports current alcohol use. She reports that she does not use drugs.  ALLERGIES: Tylenol [acetaminophen] and Aleve [naproxen]  MEDICATIONS:  Current Outpatient Medications  Medication Sig Dispense Refill   ACCU-CHEK GUIDE test strip USE TO CHECK BLOOD SUGAR UP TO 4 TIMES DAILY AS DIRECTED 100 each 0   Accu-Chek Softclix Lancets lancets USE TO CHECK BLOOD SUAGR UP TO 4 TIMES DAILY AS DIRECTED 100 each 0   blood glucose meter kit and supplies KIT Dispense based on patient and insurance preference. Use up to four times daily as directed. 1 each 1   calcium carbonate (TUMS - DOSED IN MG ELEMENTAL CALCIUM) 500 MG chewable tablet Chew 1 tablet by mouth daily.     glipiZIDE  (GLUCOTROL) 5 MG tablet Take 1 tablet (5 mg total) by mouth daily before breakfast. 30 tablet 1   metFORMIN (GLUCOPHAGE) 500 MG tablet Take 1 tablet (500 mg total) by mouth daily. 90 tablet 1   oxyCODONE (OXY IR/ROXICODONE) 5 MG immediate release tablet Take 1 tablet (5 mg total) by mouth every 6 (six) hours as needed for up to 24 doses for severe pain or breakthrough pain. 24 tablet 0   triamcinolone cream (KENALOG) 0.1 % Apply 1 Application topically 2 (two) times daily. 80 g 0   No current facility-administered medications for this encounter.    ECOG PERFORMANCE STATUS:  0 - Asymptomatic  REVIEW OF SYSTEMS: Patient denies any weight loss, fatigue, weakness, fever, chills or night sweats. Patient denies any loss of vision, blurred vision. Patient denies any ringing  of the ears or hearing loss. No irregular heartbeat. Patient denies heart murmur or history of fainting. Patient denies any chest pain or pain radiating to her upper extremities. Patient denies any shortness of breath, difficulty breathing at night, cough or hemoptysis. Patient denies any swelling in the lower legs. Patient denies any nausea vomiting, vomiting of blood, or coffee ground material in the vomitus. Patient denies any stomach pain. Patient states has had normal bowel movements  no significant constipation or diarrhea. Patient denies any dysuria, hematuria or significant nocturia. Patient denies any problems walking, swelling in the joints or loss of balance. Patient denies any skin changes, loss of hair or loss of weight. Patient denies any excessive worrying or anxiety or significant depression. Patient denies any problems with insomnia. Patient denies excessive thirst, polyuria, polydipsia. Patient denies any swollen glands, patient denies easy bruising or easy bleeding. Patient denies any recent infections, allergies or URI. Patient "s visual fields have not changed significantly in recent time.   PHYSICAL EXAM: BP (!)  152/99 (BP Location: Left Wrist, Patient Position: Sitting, Cuff Size: Small)   Pulse 74   Temp 98.2 F (36.8 C) (Tympanic)   Resp 18   Wt 169 lb 4.8 oz (76.8 kg)   LMP  (LMP Unknown) Comment: Patient is not a good historian  BMI 31.99 kg/m  Patient is status post bilateral mastectomies.  Chest wall is well-healed.  No evidence of mass or nodularity to chest wall is noted.  No axillary or supraclavicular adenopathy is identified.  Well-developed well-nourished patient in NAD. HEENT reveals PERLA, EOMI, discs not visualized.  Oral cavity is clear. No oral mucosal lesions are identified. Neck is clear without evidence of cervical or supraclavicular adenopathy. Lungs are clear to A&P. Cardiac examination is essentially unremarkable with regular rate and rhythm without murmur rub or thrill. Abdomen is benign with no organomegaly or masses noted. Motor sensory and DTR levels are equal and symmetric in the upper and lower extremities. Cranial nerves II through XII are grossly intact. Proprioception is intact. No peripheral adenopathy or edema is identified. No motor or sensory levels are noted. Crude visual fields are within normal range.  LABORATORY DATA: Pathology reports reviewed    RADIOLOGY RESULTS: CT scans PET CT scans mammogram and ultrasound and MRI of breasts all reviewed compatible with above-stated findings   IMPRESSION: Bilateral local advanced breast cancer stage is as stated above in 61 year old female  PLAN: At this time I have recommended bilateral chest wall and peripheral lymphatic radiation.  Would treat the 5040 cGy in 28 fractions to both chest walls and peripheral lymphatics.  Would also boost her scar another 1000 cGy using electron beam.  Risks and benefits of treatment including skin reaction fatigue alteration of blood counts patient seems to comprehend my recommendations well.  I personally set up and ordered CT simulation.  I would like to take this opportunity to thank  you for allowing me to participate in the care of your patient.Noreene Filbert, MD

## 2021-12-24 NOTE — Progress Notes (Signed)
Spoke with patient this morning prior to radiation consult.   She is concerned with her diet and not eating the right foods.   She would like to speak with Joli again.

## 2021-12-25 ENCOUNTER — Ambulatory Visit: Payer: Medicaid Other

## 2021-12-25 DIAGNOSIS — M546 Pain in thoracic spine: Secondary | ICD-10-CM | POA: Diagnosis not present

## 2021-12-25 DIAGNOSIS — Z9013 Acquired absence of bilateral breasts and nipples: Secondary | ICD-10-CM | POA: Diagnosis not present

## 2021-12-25 DIAGNOSIS — M25611 Stiffness of right shoulder, not elsewhere classified: Secondary | ICD-10-CM | POA: Diagnosis not present

## 2021-12-25 DIAGNOSIS — M25612 Stiffness of left shoulder, not elsewhere classified: Secondary | ICD-10-CM

## 2021-12-25 NOTE — Therapy (Signed)
OUTPATIENT PHYSICAL THERAPY SHOULDER TREATMENT   Patient Name: Sharon Woods MRN: [id number] DOB:Jun 22, 1960, 61 y.o., female Today's Date: 12/25/2021   PT End of Session - 12/25/21 1326     Visit Number 2    Number of Visits 17    Date for PT Re-Evaluation 02/21/22    Authorization Type [place] [employer]    Authorization Time Period Authorizaed for 12/23/21-03/23/22 for 13 viists; Certification for 3/79/02-40/9/73;    Authorization - Visit Number 1    Authorization - Number of Visits 13    Progress Note Due on Visit 10    PT Start Time 5329    PT Stop Time 1402    PT Time Calculation (min) 40 min    Activity Tolerance Patient tolerated treatment well;No increased pain    Behavior During Therapy WFL for tasks assessed/performed             Past Medical History:  Diagnosis Date   Anxiety    Depression    Diabetes mellitus without complication ([place])    History of high cholesterol 2000   Hypertension    Patient denies medical problems    Thrombocytopenia ([place])    Past Surgical History:  Procedure Laterality Date   ABDOMINAL HYSTERECTOMY     AXILLARY SENTINEL NODE BIOPSY Left 11/15/2021   Procedure: AXILLARY SENTINEL NODE BIOPSY;  Surgeon: [name], MD;  Location: [facility] ORS;  Service: General;  Laterality: Left;   BREAST BIOPSY Right 08/21/2021   [place] bx 7:00 mass coil clip path pending   BREAST BIOPSY Right 08/21/2021   [place] bx of LN, hydro marker, path pending   BREAST BIOPSY Left 08/21/2021   [place] bx, heart marker, path pending   CESAREAN SECTION     x2   MASTECTOMY MODIFIED RADICAL Right 11/15/2021   Procedure: MASTECTOMY MODIFIED RADICAL;  Surgeon: [name], MD;  Location: [facility];  Service: General;  Laterality: Right;   NO PAST SURGERIES     TOTAL MASTECTOMY Left 11/15/2021   Procedure: TOTAL MASTECTOMY;  Surgeon: [name], MD;  Location: [facility] OR[facility];  Service: General;  Laterality: Left;   Patient Active Problem List   Diagnosis Date  Noted   Diabetes mellitus ([place]) 12/05/2021   Status post bilateral mastectomy 11/21/2021   Bilateral breast cancer ([place]) 11/15/2021   Genetic testing 10/07/2021   Vaginitis 09/28/2021   Thrombocytopenia ([place]) 09/08/2021   Malignant neoplasm of left breast ([place]) 08/28/2021   Malignant neoplasm of lower-outer quadrant of right breast of female, estrogen receptor positive ([place]) 08/28/2021   Goals of care, counseling/discussion 08/28/2021   Severe recurrent major depression with psychotic features ([place]) 11/15/2015   Hypertension 11/15/2015   Noncompliance 11/15/2015   Severe major depression, single episode, with psychotic features, mood-congruent ([place]) 03/29/2015   Protein-calorie malnutrition, severe 03/26/2015   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    PCP: No PCP  REFERRING PROVIDER: [name] MD  REFERRING DIAG: Decreased bilat shoulder ROM post double masectomy  THERAPY DIAG:  Stiffness of right shoulder, not elsewhere classified  Stiffness of left shoulder, not elsewhere classified  Pain in thoracic spine  Rationale for Evaluation and Treatment Rehabilitation  ONSET DATE: August 18th 2023  SUBJECTIVE:  SUBJECTIVE STATEMENT: Pt reports no place at home to set up pullies. Her pain seem ssomewhat improved today around a 4/10. No other updates.    PERTINENT HISTORY: Pt is a 61 year old female presenting with bilat decreased shoulder ROM following double masectomy 11/15/21. Not currently under any cancer treatments, is seeking further treatments. No lymphedema, or treatment for this at this time. Reports since surgery she has decreased overhead motion. She has bilat pain at the axilla of her Ues. Pain currently 5/10; lowest pain score 3/10; worst pain 9/10. The pain she feels is sharp and she also has  numbness/tingling in the axilla across the top of bilat breasts. Pain is aggravated by reaching overhead, reaching behind her back, pulling, pushing... really anything I do with my arms. Laying on her back hurts as well. Pain is relieved by tylenol, pain medication , and ice. 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 Patient does not have a license or vehicle, relies on family and public transit. Pt denies N/V, B&B changes, unexplained weight fluctuation, saddle paresthesia, fever, night sweats, or unrelenting night pain at this time.  Pt poor historian, seems to not have understanding of current condition.  PMH gathered from chart review: 11/05/16 L simple mastectomy and R modified mastectomy following stage 2 breast cancer diagnosis with lymphnode involvement; recent cellulitis from drainage with course of antibiotics.   PAIN:  Are you having pain? Yes: NPRS scale: 4/10 Pain location: bilat axilla Pain description: tingling, sharp Aggravating factors: reaching overhead, reaching behind her back, pulling, pushing... really anything I do with my arms. Laying on her back hurts as well.  Relieving factors: tylenol, pain medication , and ice  PRECAUTIONS: None   OBJECTIVE:   UPPER EXTREMITY ROM:   Active /PROM at eval Right eval Left eval  Shoulder flexion 104/146 106/144  Shoulder extension WNL WNL  Shoulder abduction 92/106 119/135  Shoulder internal rotation (apleys) T12/WNL T12/WNL  Shoulder external rotation (apleys) C5/WNL CTJ/WNL  Elbow flexion WNL WNL  Elbow extension WNL WNL  (Blank rows = not tested)      TODAY'S TREATMENT:  -AA/ROM on Nustep, SPM ad lib, seat 6 arms on 9 x4 minutes, longitudinal towen roll added at minute 2, arms extended at minute 4 to 10.5 bilat to increase trunk rotation  -supine on cross towel roll level T6/7 x3 minutes, then added in 20x wand flexion -supine still on  aforementioned towel roll, cervical retraction tripple pillow crushers 15x5secH  -seated deep 3 sec inhalation, 3sec exhalation x10   -seated extension over chairback 1x12 -seated RUE shoulder table slides 3x30sec (added to HEP until pulley situation is figured out)  -seated cable row 15lb x15 (in neutral abduction angle)       Educated on daily scar massage  PATIENT EDUCATION: Education details: Patient was educated on diagnosis, anatomy and pathology involved, prognosis, role of PT, and was given an HEP, demonstrating exercise with proper form following verbal and tactile cues, and was given a paper hand out to continue exercise at home. Pt was educated on and agreed to plan of care.  Person educated: Patient Education method: Explanation, Demonstration, and Handouts Education comprehension: verbalized understanding, returned demonstration, and tactile cues required   HOME EXERCISE PROGRAM: Access Code: [email or web address] URL: [email or web address] Date: 12/25/2021 Prepared by: Rebbeca Paul  Exercises - Seated Thoracic Lumbar Extension with Pectoralis Stretch  - 3 x daily - 7 x weekly -  12 reps - 2sec hold - Seated Shoulder Abduction Towel Slide at Table Top  - 3 x daily - 7 x weekly - 1 sets - 3 reps - 30 hold - Seated Shoulder Abduction AAROM with Pulley Behind  - 3 x daily - 7 x weekly - 12 reps - 2sec hold - Seated Shoulder Flexion AAROM with Pulley Behind  - 3 x daily - 7 x weekly - 12 reps - 2sec hold  ASSESSMENT:  CLINICAL IMPRESSION: Pt partakes in AA/ROM, postural stretching. Pt has not found a olace for pulleys at home, so we added in abduction table slides, may continue to modify as needed. Good tolerance overall with reduced pain y end of session. Pt endorses continued fluctuations in brain fog from treatments that limit her ability to relay feedback or attend to sets/reps. Activity limitations in overhead reaching, lifting, pushing, pulling, reaching behind the  back, and holding;inhibiting full participation in ADLs. Would benefit from skilled PT to address above deficits and promote optimal return to PLOF.    OBJECTIVE IMPAIRMENTS decreased activity tolerance, decreased coordination, decreased endurance, decreased mobility, decreased ROM, decreased strength, increased muscle spasms, impaired flexibility, impaired tone, impaired UE functional use, improper body mechanics, postural dysfunction, and pain.   ACTIVITY LIMITATIONS carrying, lifting, sleeping, transfers, bathing, dressing, reach over head, and hygiene/grooming  PARTICIPATION LIMITATIONS: meal prep, cleaning, laundry, shopping, community activity, and occupation  PERSONAL FACTORS Age, Education, Fitness, Past/current experiences, Sex, Social background, Time since onset of injury/illness/exacerbation, Transportation, and 3+ comorbidities: A&D, DB, HTN, history of cancer  are also affecting patient's functional outcome.   REHAB POTENTIAL: Good  CLINICAL DECISION MAKING: Evolving/moderate complexity  EVALUATION COMPLEXITY: Moderate   GOALS: Goals reviewed with patient? Yes  SHORT TERM GOALS: Target date: 01/22/2022  (Remove Blue Hyperlink)  Pt will be independent with HEP in order to improve strength and balance in order to decrease fall risk and improve function at home and work. Baseline: 12/18/21 HEP given  Goal status: INITIAL    LONG TERM GOALS: Target date: 02/19/2022  (Remove Blue Hyperlink)  Pt will decrease worst pain as reported on NPRS by at least 3 points in order to demonstrate clinically significant reduction in pain.  Baseline: 12/21/21 9/10 Goal status: INITIAL  2.  Patient will increase FOTO score to 61 to demonstrate predicted increase in functional mobility to complete ADLs  Baseline: 12/21/21 40 Goal status: INITIAL  3.  Pt will demonstrate 4+/5 gross periscapular MMT in order to complete heavy household ADLs Baseline: 12/21/21 Y lower trap 3+/4- ; T  Scapular retractors 4-/4 Goal status: INITIAL  4.  Pt will demonstrate full shoulder ROM in order to complete household and self care ADLs Baseline: R/L flex 104/106 abd: 92/119 ER: C5/WNL Goal status: INITIAL    PLAN: PT FREQUENCY: 2x/week  PT DURATION: 8 weeks  PLANNED INTERVENTIONS: Therapeutic exercises, Therapeutic activity, Neuromuscular re-education, Balance training, Gait training, Patient/Family education, Self Care, Joint mobilization, Joint manipulation, DME instructions, Aquatic Therapy, Dry Needling, Electrical stimulation, Spinal manipulation, Spinal mobilization, Cryotherapy, Moist heat, Traction, Ultrasound, Manual therapy, and Re-evaluation  PLAN FOR NEXT SESSION: HEP review   2:06 PM, 12/25/21 Etta Grandchild, PT, DPT Physical Therapist - [place] [phone] (Office)   Arlyn Buerkle C, PT 12/25/2021, 1:40 PM

## 2021-12-27 ENCOUNTER — Encounter: Payer: Self-pay | Admitting: *Deleted

## 2021-12-27 ENCOUNTER — Inpatient Hospital Stay: Payer: Medicaid Other

## 2021-12-27 NOTE — Progress Notes (Signed)
When speaking with Joli the dietician today she said she couldn't find the phone number for the new PCP she will be seeing.   I reached out to her and gave the appt. Details given with the phone number and address of the office.  I also mailed a copy of the appointments to her.

## 2021-12-27 NOTE — Progress Notes (Signed)
Nutrition Follow-up:  Asked to speak with patient as she has questions about foods to eat with diabetes  Patient with breast cancer, s/p bilateral mastectomies.  Planning radiation.   Called and spoke with patient via phone.    Medications: glipzide, metformin  Labs: reviewed  Anthropometrics:   Weight 169 lb 4.8 oz   INTERVENTION:  Reviewed avoiding sugary beverages.  Provided examples of drinks that have sugar in them and alternatives.   Encouraged patient to reduce concentrated sweets in diet (cakes, cookies, candies, pies, ice cream, etc)   Patient had hard time verbalizing understanding of these two recommendations.   Encouraged taking DM medication as prescribed and keeping log of blood glucose Patient asking for number to PCP's office she lost paperwork with that number.  RD has notified nurse navigator to provide number to patient again.     NEXT VISIT: no follow-up Available as needed  Makhayla Mcmurry B. Zenia Resides, [place], [place] Registered Dietitian [phone]

## 2021-12-31 ENCOUNTER — Ambulatory Visit: Payer: Medicaid Other | Admitting: Physical Therapy

## 2021-12-31 ENCOUNTER — Ambulatory Visit: Payer: Medicaid Other | Admitting: Oncology

## 2021-12-31 ENCOUNTER — Ambulatory Visit
Admission: RE | Admit: 2021-12-31 | Discharge: 2021-12-31 | Disposition: A | Discharge status: Home / Self Care | Payer: Medicaid Other | Source: Ambulatory Visit | Attending: Radiation Oncology | Admitting: Radiation Oncology

## 2021-12-31 DIAGNOSIS — Z809 Family history of malignant neoplasm, unspecified: Secondary | ICD-10-CM | POA: Insufficient documentation

## 2021-12-31 DIAGNOSIS — Z170 Estrogen receptor positive status [ER+]: Secondary | ICD-10-CM | POA: Insufficient documentation

## 2021-12-31 DIAGNOSIS — Z510 Encounter for antineoplastic radiation therapy: Secondary | ICD-10-CM | POA: Diagnosis present

## 2021-12-31 DIAGNOSIS — D696 Thrombocytopenia, unspecified: Secondary | ICD-10-CM | POA: Diagnosis not present

## 2021-12-31 DIAGNOSIS — Z87891 Personal history of nicotine dependence: Secondary | ICD-10-CM | POA: Diagnosis not present

## 2021-12-31 DIAGNOSIS — F322 Major depressive disorder, single episode, severe without psychotic features: Secondary | ICD-10-CM | POA: Insufficient documentation

## 2021-12-31 DIAGNOSIS — Z801 Family history of malignant neoplasm of trachea, bronchus and lung: Secondary | ICD-10-CM | POA: Insufficient documentation

## 2021-12-31 DIAGNOSIS — C50412 Malignant neoplasm of upper-outer quadrant of left female breast: Secondary | ICD-10-CM | POA: Diagnosis not present

## 2021-12-31 DIAGNOSIS — E119 Type 2 diabetes mellitus without complications: Secondary | ICD-10-CM | POA: Insufficient documentation

## 2021-12-31 DIAGNOSIS — Z9013 Acquired absence of bilateral breasts and nipples: Secondary | ICD-10-CM | POA: Diagnosis not present

## 2021-12-31 DIAGNOSIS — C50511 Malignant neoplasm of lower-outer quadrant of right female breast: Secondary | ICD-10-CM | POA: Insufficient documentation

## 2022-01-02 ENCOUNTER — Ambulatory Visit: Payer: Medicaid Other | Admitting: Physical Therapy

## 2022-01-02 ENCOUNTER — Ambulatory Visit: Payer: Medicaid Other | Attending: Surgery

## 2022-01-02 DIAGNOSIS — M546 Pain in thoracic spine: Secondary | ICD-10-CM | POA: Diagnosis present

## 2022-01-02 DIAGNOSIS — M25611 Stiffness of right shoulder, not elsewhere classified: Secondary | ICD-10-CM | POA: Diagnosis present

## 2022-01-02 DIAGNOSIS — M25612 Stiffness of left shoulder, not elsewhere classified: Secondary | ICD-10-CM | POA: Diagnosis present

## 2022-01-02 NOTE — Therapy (Signed)
OUTPATIENT PHYSICAL THERAPY SHOULDER TREATMENT   Patient Name: Sharon Woods MRN: [id number] DOB:30-Apr-1960, 61 y.o., female Today's Date: 01/02/2022   PT End of Session - 01/02/22 1034     Visit Number 3    Number of Visits 17    Date for PT Re-Evaluation 02/21/22    Authorization Type [place] Medicaid Healthy Blue    Authorization Time Period Authorizaed for 12/23/21-03/23/22 for 13 viists; Certification for 12/02/98-92/3/30;    Authorization - Visit Number 1    Authorization - Number of Visits 13    Progress Note Due on Visit 10    PT Start Time 0762    PT Stop Time 1115    PT Time Calculation (min) 43 min    Activity Tolerance Patient tolerated treatment well;No increased pain    Behavior During Therapy WFL for tasks assessed/performed             Past Medical History:  Diagnosis Date   Anxiety    Depression    Diabetes mellitus without complication ([place])    History of high cholesterol 2000   Hypertension    Patient denies medical problems    Thrombocytopenia ([place])    Past Surgical History:  Procedure Laterality Date   ABDOMINAL HYSTERECTOMY     AXILLARY SENTINEL NODE BIOPSY Left 11/15/2021   Procedure: AXILLARY SENTINEL NODE BIOPSY;  Surgeon: [name], MD;  Location: [facility];  Service: General;  Laterality: Left;   BREAST BIOPSY Right 08/21/2021   [place] bx 7:00 mass coil clip path pending   BREAST BIOPSY Right 08/21/2021   [place] bx of LN, hydro marker, path pending   BREAST BIOPSY Left 08/21/2021   [place] bx, heart marker, path pending   CESAREAN SECTION     x2   MASTECTOMY MODIFIED RADICAL Right 11/15/2021   Procedure: MASTECTOMY MODIFIED RADICAL;  Surgeon: [name], MD;  Location: [facility];  Service: General;  Laterality: Right;   NO PAST SURGERIES     TOTAL MASTECTOMY Left 11/15/2021   Procedure: TOTAL MASTECTOMY;  Surgeon: [name], MD;  Location: [facility];  Service: General;  Laterality: Left;   Patient Active Problem List   Diagnosis Date  Noted   Diabetes mellitus ([place]) 12/05/2021   Status post bilateral mastectomy 11/21/2021   Bilateral breast cancer ([place]) 11/15/2021   Genetic testing 10/07/2021   Vaginitis 09/28/2021   Thrombocytopenia ([place]) 09/08/2021   Malignant neoplasm of left breast ([place]) 08/28/2021   Malignant neoplasm of lower-outer quadrant of right breast of female, estrogen receptor positive ([place]) 08/28/2021   Goals of care, counseling/discussion 08/28/2021   Severe recurrent major depression with psychotic features ([place]) 11/15/2015   Hypertension 11/15/2015   Noncompliance 11/15/2015   Severe major depression, single episode, with psychotic features, mood-congruent ([place]) 03/29/2015   Protein-calorie malnutrition, severe 03/26/2015   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    PCP: No PCP  REFERRING PROVIDER: [name] MD  REFERRING DIAG: Decreased bilat shoulder ROM post double masectomy  THERAPY DIAG:  Stiffness of right shoulder, not elsewhere classified  Stiffness of left shoulder, not elsewhere classified  Pain in thoracic spine  Rationale for Evaluation and Treatment Rehabilitation  ONSET DATE: August 18th 2023  SUBJECTIVE:  SUBJECTIVE STATEMENT: Pt reports she has been able to figure out her pulley's at home. Denies pain. Just some soreness and discomfort. Reports starting radiation soon.    PERTINENT HISTORY: Pt is a 61 year old female presenting with bilat decreased shoulder ROM following double masectomy 11/15/21. Not currently under any cancer treatments, is seeking further treatments. No lymphedema, or treatment for this at this time. Reports since surgery she has decreased overhead motion. She has bilat pain at the axilla of her Ues. Pain currently 5/10; lowest pain score 3/10; worst pain 9/10. The pain she feels is sharp  and she also has numbness/tingling in the axilla across the top of bilat breasts. Pain is aggravated by reaching overhead, reaching behind her back, pulling, pushing... really anything I do with my arms. Laying on her back hurts as well. Pain is relieved by tylenol, pain medication , and ice. 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 Patient does not have a license or vehicle, relies on family and public transit. Pt denies N/V, B&B changes, unexplained weight fluctuation, saddle paresthesia, fever, night sweats, or unrelenting night pain at this time.  Pt poor historian, seems to not have understanding of current condition.  PMH gathered from chart review: 11/05/16 L simple mastectomy and R modified mastectomy following stage 2 breast cancer diagnosis with lymphnode involvement; recent cellulitis from drainage with course of antibiotics.   PAIN:  Are you having pain? Yes: NPRS scale: 4/10 Pain location: bilat axilla Pain description: tingling, sharp Aggravating factors: reaching overhead, reaching behind her back, pulling, pushing... really anything I do with my arms. Laying on her back hurts as well.  Relieving factors: tylenol, pain medication , and ice  PRECAUTIONS: None   OBJECTIVE:   UPPER EXTREMITY ROM:   Active /PROM at eval Right eval Left eval  Shoulder flexion 104/146 106/144  Shoulder extension WNL WNL  Shoulder abduction 92/106 119/135  Shoulder internal rotation (apleys) T12/WNL T12/WNL  Shoulder external rotation (apleys) C5/WNL CTJ/WNL  Elbow flexion WNL WNL  Elbow extension WNL WNL  (Blank rows = not tested)      TODAY'S TREATMENT: 01/02/2022   There.ex:   Nu Step L2 for 5 minutes. Use of towel roll along thoracic spine to improve trunk rotation.    Seated pulleys flexion, scaption, abduction: 2x20/plane of motion    Seated thoracic extension over half bolster: 3x15 UE's resting on  head.    Hook lying cervical retractions: 3x12   Hook lying AAROM shoulder flexion with dowel: 3x12, towel roll along T spine for thoracic extension    Seated scap retractions at OMEGA: 2x15, 15#. Min VC's for form/technique with exercise.     PATIENT EDUCATION: Education details: Patient was educated on diagnosis, anatomy and pathology involved, prognosis, role of PT, and was given an HEP, demonstrating exercise with proper form following verbal and tactile cues, and was given a paper hand out to continue exercise at home. Pt was educated on and agreed to plan of care.  Person educated: Patient Education method: Explanation, Demonstration, and Handouts Education comprehension: verbalized understanding, returned demonstration, and tactile cues required   HOME EXERCISE PROGRAM: Access Code: [email or web address] URL: [email or web address] Date: 12/25/2021 Prepared by: Rebbeca Paul  Exercises - Seated Thoracic Lumbar Extension with Pectoralis Stretch  - 3 x daily - 7 x weekly - 12 reps - 2sec hold - Seated Shoulder Abduction Towel Slide at Table Top  - 3 x  daily - 7 x weekly - 1 sets - 3 reps - 30 hold - Seated Shoulder Abduction AAROM with Pulley Behind  - 3 x daily - 7 x weekly - 12 reps - 2sec hold - Seated Shoulder Flexion AAROM with Pulley Behind  - 3 x daily - 7 x weekly - 12 reps - 2sec hold  ASSESSMENT:  CLINICAL IMPRESSION: Continuing PT POC with focus on improving thoracic, cervical and shoulder mobility to assist in return to full, shoulder AROM and decreased pain. No exacerbation of pain noted during session. Compliance with HEP and scar massage could be leading to improvements in pain. Pt does require some VC's for reminder in form/technique but does have excellent carryover post cuing. Pt will continue to benefit from skilled PT services to address remaining deficits to return to PLOF.    OBJECTIVE IMPAIRMENTS decreased activity tolerance, decreased coordination,  decreased endurance, decreased mobility, decreased ROM, decreased strength, increased muscle spasms, impaired flexibility, impaired tone, impaired UE functional use, improper body mechanics, postural dysfunction, and pain.   ACTIVITY LIMITATIONS carrying, lifting, sleeping, transfers, bathing, dressing, reach over head, and hygiene/grooming  PARTICIPATION LIMITATIONS: meal prep, cleaning, laundry, shopping, community activity, and occupation  PERSONAL FACTORS Age, Education, Fitness, Past/current experiences, Sex, Social background, Time since onset of injury/illness/exacerbation, Transportation, and 3+ comorbidities: A&D, DB, HTN, history of cancer  are also affecting patient's functional outcome.   REHAB POTENTIAL: Good  CLINICAL DECISION MAKING: Evolving/moderate complexity  EVALUATION COMPLEXITY: Moderate   GOALS: Goals reviewed with patient? Yes  SHORT TERM GOALS: Target date: 01/30/2022  (Remove Blue Hyperlink)  Pt will be independent with HEP in order to improve strength and balance in order to decrease fall risk and improve function at home and work. Baseline: 12/18/21 HEP given  Goal status: INITIAL    LONG TERM GOALS: Target date: 02/27/2022  (Remove Blue Hyperlink)  Pt will decrease worst pain as reported on NPRS by at least 3 points in order to demonstrate clinically significant reduction in pain.  Baseline: 12/21/21 9/10 Goal status: INITIAL  2.  Patient will increase FOTO score to 61 to demonstrate predicted increase in functional mobility to complete ADLs  Baseline: 12/21/21 40 Goal status: INITIAL  3.  Pt will demonstrate 4+/5 gross periscapular MMT in order to complete heavy household ADLs Baseline: 12/21/21 Y lower trap 3+/4- ; T Scapular retractors 4-/4 Goal status: INITIAL  4.  Pt will demonstrate full shoulder ROM in order to complete household and self care ADLs Baseline: R/L flex 104/106 abd: 92/119 ER: C5/WNL Goal status: INITIAL    PLAN: PT  FREQUENCY: 2x/week  PT DURATION: 8 weeks  PLANNED INTERVENTIONS: Therapeutic exercises, Therapeutic activity, Neuromuscular re-education, Balance training, Gait training, Patient/Family education, Self Care, Joint mobilization, Joint manipulation, DME instructions, Aquatic Therapy, Dry Needling, Electrical stimulation, Spinal manipulation, Spinal mobilization, Cryotherapy, Moist heat, Traction, Ultrasound, Manual therapy, and Re-evaluation  PLAN FOR NEXT SESSION: progress shoulder mobility.      Salem Caster. Fairly IV, PT, DPT Physical Therapist- [facility]  01/02/2022, 11:25 AM

## 2022-01-03 ENCOUNTER — Other Ambulatory Visit: Payer: Self-pay | Admitting: *Deleted

## 2022-01-03 DIAGNOSIS — C50412 Malignant neoplasm of upper-outer quadrant of left female breast: Secondary | ICD-10-CM

## 2022-01-03 DIAGNOSIS — Z170 Estrogen receptor positive status [ER+]: Secondary | ICD-10-CM

## 2022-01-06 DIAGNOSIS — Z510 Encounter for antineoplastic radiation therapy: Secondary | ICD-10-CM | POA: Diagnosis not present

## 2022-01-06 DIAGNOSIS — Z170 Estrogen receptor positive status [ER+]: Secondary | ICD-10-CM | POA: Diagnosis not present

## 2022-01-06 DIAGNOSIS — C50412 Malignant neoplasm of upper-outer quadrant of left female breast: Secondary | ICD-10-CM | POA: Diagnosis not present

## 2022-01-06 DIAGNOSIS — C50511 Malignant neoplasm of lower-outer quadrant of right female breast: Secondary | ICD-10-CM | POA: Diagnosis not present

## 2022-01-08 ENCOUNTER — Ambulatory Visit: Admission: RE | Admit: 2022-01-08 | Payer: Medicaid Other | Source: Ambulatory Visit

## 2022-01-08 ENCOUNTER — Ambulatory Visit: Payer: Medicaid Other | Admitting: Physical Therapy

## 2022-01-08 ENCOUNTER — Encounter: Payer: Self-pay | Admitting: Physical Therapy

## 2022-01-08 DIAGNOSIS — M25611 Stiffness of right shoulder, not elsewhere classified: Secondary | ICD-10-CM | POA: Diagnosis not present

## 2022-01-08 DIAGNOSIS — Z510 Encounter for antineoplastic radiation therapy: Secondary | ICD-10-CM | POA: Diagnosis not present

## 2022-01-08 DIAGNOSIS — Z170 Estrogen receptor positive status [ER+]: Secondary | ICD-10-CM | POA: Diagnosis not present

## 2022-01-08 DIAGNOSIS — M546 Pain in thoracic spine: Secondary | ICD-10-CM

## 2022-01-08 DIAGNOSIS — C50412 Malignant neoplasm of upper-outer quadrant of left female breast: Secondary | ICD-10-CM | POA: Diagnosis not present

## 2022-01-08 DIAGNOSIS — C50511 Malignant neoplasm of lower-outer quadrant of right female breast: Secondary | ICD-10-CM | POA: Diagnosis not present

## 2022-01-08 DIAGNOSIS — M25612 Stiffness of left shoulder, not elsewhere classified: Secondary | ICD-10-CM

## 2022-01-08 NOTE — Therapy (Signed)
OUTPATIENT PHYSICAL THERAPY SHOULDER TREATMENT   Patient Name: Sharon Woods MRN: [id number] DOB:[birth year], 61 y.o., female Today's Date: 01/08/2022   PT End of Session - 01/08/22 0943     Visit Number 4    Number of Visits 17    Date for PT Re-Evaluation 02/21/22    Authorization Type  Medicaid [employer]    Authorization Time Period Authorizaed for 12/23/21-03/23/22 for 13 viists; Certification for 1/66/06-30/1/60;    Authorization - Visit Number 4    Authorization - Number of Visits 13    Progress Note Due on Visit 10    PT Start Time 0919    PT Stop Time 0957    PT Time Calculation (min) 38 min    Activity Tolerance Patient tolerated treatment well;No increased pain    Behavior During Therapy WFL for tasks assessed/performed              Past Medical History:  Diagnosis Date   Anxiety    Depression    Diabetes mellitus without complication ([place])    History of high cholesterol 2000   Hypertension    Patient denies medical problems    Thrombocytopenia ([place])    Past Surgical History:  Procedure Laterality Date   ABDOMINAL HYSTERECTOMY     AXILLARY SENTINEL NODE BIOPSY Left 11/15/2021   Procedure: AXILLARY SENTINEL NODE BIOPSY;  Surgeon: [name], MD;  Location: [facility] ORS;  Service: General;  Laterality: Left;   BREAST BIOPSY Right 08/21/2021   [place] bx 7:00 mass coil clip path pending   BREAST BIOPSY Right 08/21/2021   [place] bx of LN, hydro marker, path pending   BREAST BIOPSY Left 08/21/2021   [place] bx, heart marker, path pending   CESAREAN SECTION     x2   MASTECTOMY MODIFIED RADICAL Right 11/15/2021   Procedure: MASTECTOMY MODIFIED RADICAL;  Surgeon: [name], MD;  Location: [facility];  Service: General;  Laterality: Right;   NO PAST SURGERIES     TOTAL MASTECTOMY Left 11/15/2021   Procedure: TOTAL MASTECTOMY;  Surgeon: [name], MD;  Location: [facility] OR[facility];  Service: General;  Laterality: Left;   Patient Active Problem List   Diagnosis  Date Noted   Diabetes mellitus ([place]) 12/05/2021   Status post bilateral mastectomy 11/21/2021   Bilateral breast cancer ([place]) 11/15/2021   Genetic testing 10/07/2021   Vaginitis 09/28/2021   Thrombocytopenia ([place]) 09/08/2021   Malignant neoplasm of left breast ([place]) 08/28/2021   Malignant neoplasm of lower-outer quadrant of right breast of female, estrogen receptor positive ([place]) 08/28/2021   Goals of care, counseling/discussion 08/28/2021   Severe recurrent major depression with psychotic features ([place]) 11/15/2015   Hypertension 11/15/2015   Noncompliance 11/15/2015   Severe major depression, single episode, with psychotic features, mood-congruent ([place]) 03/29/2015   Protein-calorie malnutrition, severe 03/26/2015   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 Starts radiation treatment today. No difficulty with HEP    PERTINENT HISTORY: Pt is a 61 year old female presenting with bilat decreased shoulder ROM following double masectomy 11/15/21. Not currently under any cancer treatments, is seeking further treatments. No lymphedema, or treatment for this at this time. Reports since surgery she has decreased overhead motion. She has bilat pain at the axilla of her Ues. Pain currently 5/10; lowest pain score 3/10; worst pain 9/10. The pain she feels is  sharp and she also has numbness/tingling in the axilla across the top of bilat breasts. Pain is aggravated by reaching overhead, reaching behind her back, pulling, pushing... really anything I do with my arms. Laying on her back hurts as well. Pain is relieved by tylenol, pain medication , and ice. 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 Patient does not have a license or vehicle, relies on family and public transit. Pt denies N/V, B&B changes, unexplained weight fluctuation, saddle paresthesia, fever, night sweats, or unrelenting night pain at this time.  Pt poor historian, seems to not have understanding of current condition.  PMH gathered from chart review: 11/05/16 L simple mastectomy and R modified mastectomy following stage 2 breast cancer diagnosis with lymphnode involvement; recent cellulitis from drainage with course of antibiotics.   PAIN:  Are you having pain? Yes: NPRS scale: 4/10 Pain location: bilat axilla Pain description: tingling, sharp Aggravating factors: reaching overhead, reaching behind her back, pulling, pushing... really anything I do with my arms. Laying on her back hurts as well.  Relieving factors: tylenol, pain medication , and ice  PRECAUTIONS: None   OBJECTIVE:   UPPER EXTREMITY ROM:   Active /PROM at eval Right eval Left eval  Shoulder flexion 104/146 106/144  Shoulder extension WNL WNL  Shoulder abduction 92/106 119/135  Shoulder internal rotation (apleys) T12/WNL T12/WNL  Shoulder external rotation (apleys) C5/WNL CTJ/WNL  Elbow flexion WNL WNL  Elbow extension WNL WNL  (Blank rows = not tested)      TODAY'S TREATMENT: There.ex:   Nu Step L3 for 5 minutes seat 6 UE 12 for gentle protraction <> retraction strengthening/movement Use of towel roll along thoracic spine to improve trunk rotation.   Seated thoracic extension over half bolster with BUE flex with  dowel  Standing snow angels x12 with good carry over of demo  OMEGA lat pulldowns 25# 2x 10 with good carry over of technique    OMEGA seated rows 25# x16 (to fatigue) 35# x10 with cuing for eccentric control with good carry over  Lat stretch seated 30sec hold Pec doorway stretch 30sec hold    PATIENT EDUCATION: Education details: Patient was educated on diagnosis, anatomy and pathology involved, prognosis, role of PT, and was given an HEP, demonstrating exercise with proper form following verbal and tactile cues, and was given a paper hand out to continue exercise at home. Pt was educated on and agreed to plan of care.  Person educated: Patient Education method: Explanation, Demonstration, and Handouts Education comprehension: verbalized understanding, returned demonstration, and tactile cues required   HOME EXERCISE PROGRAM: Access Code: [email or web address] URL: [email or web address] Date: 12/25/2021 Prepared by: Rebbeca Paul  Exercises - Seated Thoracic Lumbar Extension with Pectoralis Stretch  - 3 x daily - 7 x weekly - 12 reps - 2sec hold - Seated Shoulder  Abduction Towel Slide at Table Top  - 3 x daily - 7 x weekly - 1 sets - 3 reps - 30 hold - Seated Shoulder Abduction AAROM with Pulley Behind  - 3 x daily - 7 x weekly - 12 reps - 2sec hold - Seated Shoulder Flexion AAROM with Pulley Behind  - 3 x daily - 7 x weekly - 12 reps - 2sec hold  ASSESSMENT:  CLINICAL IMPRESSION: Continuing PT POC with focus on improving thoracic, cervical and shoulder mobility to assist in return to full, shoulder AROM and decreased pain. No exacerbation of pain noted during session. Pt is able to comply with all cuing for proper technique of therex with good effort throughout session. No pain throughout session. Pt will continue to benefit from skilled PT services to address remaining deficits to return to PLOF.    OBJECTIVE IMPAIRMENTS decreased activity tolerance, decreased coordination,  decreased endurance, decreased mobility, decreased ROM, decreased strength, increased muscle spasms, impaired flexibility, impaired tone, impaired UE functional use, improper body mechanics, postural dysfunction, and pain.   ACTIVITY LIMITATIONS carrying, lifting, sleeping, transfers, bathing, dressing, reach over head, and hygiene/grooming  PARTICIPATION LIMITATIONS: meal prep, cleaning, laundry, shopping, community activity, and occupation  PERSONAL FACTORS Age, Education, Fitness, Past/current experiences, Sex, Social background, Time since onset of injury/illness/exacerbation, Transportation, and 3+ comorbidities: A&D, DB, HTN, history of cancer  are also affecting patient's functional outcome.   REHAB POTENTIAL: Good  CLINICAL DECISION MAKING: Evolving/moderate complexity  EVALUATION COMPLEXITY: Moderate   GOALS: Goals reviewed with patient? Yes  SHORT TERM GOALS: Target date: 02/05/2022  (Remove Blue Hyperlink)  Pt will be independent with HEP in order to improve strength and balance in order to decrease fall risk and improve function at home and work. Baseline: 12/18/21 HEP given  Goal status: INITIAL    LONG TERM GOALS: Target date: 03/05/2022  (Remove Blue Hyperlink)  Pt will decrease worst pain as reported on NPRS by at least 3 points in order to demonstrate clinically significant reduction in pain.  Baseline: 12/21/21 9/10 Goal status: INITIAL  2.  Patient will increase FOTO score to 61 to demonstrate predicted increase in functional mobility to complete ADLs  Baseline: 12/21/21 40 Goal status: INITIAL  3.  Pt will demonstrate 4+/5 gross periscapular MMT in order to complete heavy household ADLs Baseline: 12/21/21 Y lower trap 3+/4- ; T Scapular retractors 4-/4 Goal status: INITIAL  4.  Pt will demonstrate full shoulder ROM in order to complete household and self care ADLs Baseline: R/L flex 104/106 abd: 92/119 ER: C5/WNL Goal status: INITIAL    PLAN: PT  FREQUENCY: 2x/week  PT DURATION: 8 weeks  PLANNED INTERVENTIONS: Therapeutic exercises, Therapeutic activity, Neuromuscular re-education, Balance training, Gait training, Patient/Family education, Self Care, Joint mobilization, Joint manipulation, DME instructions, Aquatic Therapy, Dry Needling, Electrical stimulation, Spinal manipulation, Spinal mobilization, Cryotherapy, Moist heat, Traction, Ultrasound, Manual therapy, and Re-evaluation  PLAN FOR NEXT SESSION: progress shoulder mobility.    Durwin Reges DPT Physical Therapist- [facility]  01/08/2022, 9:56 AM

## 2022-01-09 ENCOUNTER — Ambulatory Visit: Payer: Self-pay | Admitting: Gastroenterology

## 2022-01-09 ENCOUNTER — Ambulatory Visit
Admission: RE | Admit: 2022-01-09 | Discharge: 2022-01-09 | Disposition: A | Discharge status: Home / Self Care | Payer: Medicaid Other | Source: Ambulatory Visit | Attending: Radiation Oncology | Admitting: Radiation Oncology

## 2022-01-09 ENCOUNTER — Other Ambulatory Visit: Payer: Self-pay

## 2022-01-09 DIAGNOSIS — C50511 Malignant neoplasm of lower-outer quadrant of right female breast: Secondary | ICD-10-CM | POA: Diagnosis not present

## 2022-01-09 DIAGNOSIS — C50412 Malignant neoplasm of upper-outer quadrant of left female breast: Secondary | ICD-10-CM | POA: Diagnosis not present

## 2022-01-09 DIAGNOSIS — Z170 Estrogen receptor positive status [ER+]: Secondary | ICD-10-CM | POA: Diagnosis not present

## 2022-01-09 DIAGNOSIS — Z510 Encounter for antineoplastic radiation therapy: Secondary | ICD-10-CM | POA: Diagnosis not present

## 2022-01-09 LAB — RAD ONC ARIA SESSION SUMMARY

## 2022-01-10 ENCOUNTER — Other Ambulatory Visit: Payer: Self-pay

## 2022-01-10 ENCOUNTER — Ambulatory Visit
Admission: RE | Admit: 2022-01-10 | Discharge: 2022-01-10 | Disposition: A | Discharge status: Home / Self Care | Payer: Medicaid Other | Source: Ambulatory Visit | Attending: Radiation Oncology | Admitting: Radiation Oncology

## 2022-01-10 ENCOUNTER — Encounter: Payer: Self-pay | Admitting: Oncology

## 2022-01-10 ENCOUNTER — Telehealth: Payer: Self-pay

## 2022-01-10 ENCOUNTER — Inpatient Hospital Stay (HOSPITAL_BASED_OUTPATIENT_CLINIC_OR_DEPARTMENT_OTHER): Payer: Medicaid Other | Admitting: Oncology

## 2022-01-10 ENCOUNTER — Inpatient Hospital Stay: Payer: Medicaid Other

## 2022-01-10 VITALS — BP 150/84 | HR 74 | Temp 98.2°F | Resp 18 | Wt 166.0 lb

## 2022-01-10 DIAGNOSIS — C50511 Malignant neoplasm of lower-outer quadrant of right female breast: Secondary | ICD-10-CM

## 2022-01-10 DIAGNOSIS — Z801 Family history of malignant neoplasm of trachea, bronchus and lung: Secondary | ICD-10-CM | POA: Insufficient documentation

## 2022-01-10 DIAGNOSIS — R948 Abnormal results of function studies of other organs and systems: Secondary | ICD-10-CM | POA: Insufficient documentation

## 2022-01-10 DIAGNOSIS — D696 Thrombocytopenia, unspecified: Secondary | ICD-10-CM

## 2022-01-10 DIAGNOSIS — C50919 Malignant neoplasm of unspecified site of unspecified female breast: Secondary | ICD-10-CM | POA: Diagnosis not present

## 2022-01-10 DIAGNOSIS — F323 Major depressive disorder, single episode, severe with psychotic features: Secondary | ICD-10-CM | POA: Diagnosis not present

## 2022-01-10 DIAGNOSIS — Z170 Estrogen receptor positive status [ER+]: Secondary | ICD-10-CM | POA: Insufficient documentation

## 2022-01-10 DIAGNOSIS — C50412 Malignant neoplasm of upper-outer quadrant of left female breast: Secondary | ICD-10-CM

## 2022-01-10 DIAGNOSIS — Z87891 Personal history of nicotine dependence: Secondary | ICD-10-CM | POA: Insufficient documentation

## 2022-01-10 DIAGNOSIS — Z510 Encounter for antineoplastic radiation therapy: Secondary | ICD-10-CM | POA: Diagnosis not present

## 2022-01-10 DIAGNOSIS — F322 Major depressive disorder, single episode, severe without psychotic features: Secondary | ICD-10-CM | POA: Insufficient documentation

## 2022-01-10 DIAGNOSIS — Z9013 Acquired absence of bilateral breasts and nipples: Secondary | ICD-10-CM | POA: Insufficient documentation

## 2022-01-10 DIAGNOSIS — Z809 Family history of malignant neoplasm, unspecified: Secondary | ICD-10-CM | POA: Insufficient documentation

## 2022-01-10 DIAGNOSIS — E119 Type 2 diabetes mellitus without complications: Secondary | ICD-10-CM | POA: Insufficient documentation

## 2022-01-10 LAB — CBC WITH DIFFERENTIAL/PLATELET
Abs Immature Granulocytes: 0.01 10*3/uL (ref 0.00–0.07)
Basophils Absolute: 0 10*3/uL (ref 0.0–0.1)
Basophils Relative: 1 %
Eosinophils Absolute: 0 10*3/uL (ref 0.0–0.5)
Eosinophils Relative: 1 %
HCT: 39 % (ref 36.0–46.0)
Hemoglobin: 12.6 g/dL (ref 12.0–15.0)
Immature Granulocytes: 0 %
Lymphocytes Relative: 27 %
Lymphs Abs: 1 10*3/uL (ref 0.7–4.0)
MCH: 29.6 pg (ref 26.0–34.0)
MCHC: 32.3 g/dL (ref 30.0–36.0)
MCV: 91.8 fL (ref 80.0–100.0)
Monocytes Absolute: 0.2 10*3/uL (ref 0.1–1.0)
Monocytes Relative: 5 %
Neutro Abs: 2.4 10*3/uL (ref 1.7–7.7)
Neutrophils Relative %: 66 %
Platelets: 75 10*3/uL — ABNORMAL LOW (ref 150–400)
RBC: 4.25 MIL/uL (ref 3.87–5.11)
RDW: 13.4 % (ref 11.5–15.5)
WBC: 3.7 10*3/uL — ABNORMAL LOW (ref 4.0–10.5)
nRBC: 0 % (ref 0.0–0.2)

## 2022-01-10 LAB — RAD ONC ARIA SESSION SUMMARY

## 2022-01-10 LAB — HEMOGLOBIN A1C
Hgb A1c MFr Bld: 5.2 % (ref 4.8–5.6)
Mean Plasma Glucose: 102.54 mg/dL

## 2022-01-10 LAB — COMPREHENSIVE METABOLIC PANEL
ALT: 20 U/L (ref 0–44)
AST: 26 U/L (ref 15–41)
Albumin: 4.7 g/dL (ref 3.5–5.0)
Alkaline Phosphatase: 60 U/L (ref 38–126)
Anion gap: 8 (ref 5–15)
BUN: 14 mg/dL (ref 6–20)
CO2: 26 mmol/L (ref 22–32)
Calcium: 9.7 mg/dL (ref 8.9–10.3)
Chloride: 107 mmol/L (ref 98–111)
Creatinine, Ser: 0.68 mg/dL (ref 0.44–1.00)
GFR, Estimated: >60 mL/min (ref >60)
Glucose, Bld: 101 mg/dL — ABNORMAL HIGH (ref 70–99)
Potassium: 3.5 mmol/L (ref 3.5–5.1)
Sodium: 141 mmol/L (ref 135–145)
Total Bilirubin: 0.7 mg/dL (ref 0.3–1.2)
Total Protein: 8.3 g/dL — ABNORMAL HIGH (ref 6.5–8.1)

## 2022-01-10 NOTE — Telephone Encounter (Signed)
Dr.Yu requests patient have PCP apt. Per patient enragement PCP appointment booked up until March in [place]  found at [facility] on 11/16 at 1045 which is only 10 min from patient's home. Patient and husband notified and verbalizes understanding.

## 2022-01-10 NOTE — Progress Notes (Signed)
Patient here for oncology follow-up appointment, expresses no new concerns at this time.    

## 2022-01-10 NOTE — Assessment & Plan Note (Signed)
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  pT2 pN1a, ER +90%, PR +90%, HER2 negative (1+)  # Right Breast invasive lobular carcinoma Grade 2, negative margins, right axillary lymph nodes  30/32 involved with metastatic carcinoma, pT1c pN3a ER +90%, PR +51-90%, HER2 negative (1+)  Patient has poor insights of her condition. Not a candidate for adjuvant IV chemotherapy. Follow up with RadOnc for Adjuvant radiation.  Plan adjuvant endocrine therapy with CKD 4/5 inhibitors after she finishes RT

## 2022-01-10 NOTE — Assessment & Plan Note (Signed)
She has been referred to establish care with GI and no showed.  She has appt in Feb 2024  

## 2022-01-10 NOTE — Assessment & Plan Note (Signed)
See above plan. 

## 2022-01-10 NOTE — Assessment & Plan Note (Addendum)
Patient does not have primary care provider. A1c has improved.  Continue glipizide and metformin.  Encourage patient to establish care with PCP

## 2022-01-10 NOTE — Assessment & Plan Note (Signed)
Etiology unknown.  Could be related to mild splenomegaly.  Check B12, folate,

## 2022-01-10 NOTE — Assessment & Plan Note (Signed)
Not on any antidepressants.  Referred to psychiatrist. She has not established care 

## 2022-01-10 NOTE — Assessment & Plan Note (Deleted)
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  pT2 pN1a, ER +90%, PR +90%, HER2 negative (1+)  # Right Breast invasive lobular carcinoma Grade 2, negative margins, right axillary lymph nodes  30/32 involved with metastatic carcinoma, pT1c pN3a ER +90%, PR +51-90%, HER2 negative (1+)  Patient has poor insights of her condition. Not a candidate for adjuvant IV chemotherapy. Refer to RadOnc for Adjuvant radiation.  Plan adjuvant endocrine therapy with CKD 4/5 inhibitors.

## 2022-01-10 NOTE — Progress Notes (Signed)
Hematology/Oncology Progress note [phone] [phone]            Patient Care Team: Patient, No Pcp Per as PCP - General (General Practice) Brannock, Heath Gold, RN Earlie Server, MD as Consulting Physician (Oncology) Daiva Huge, RN as Oncology Nurse Navigator  ASSESSMENT & PLAN:   Cancer Staging  Malignant neoplasm of left breast [facility]) Staging form: Breast, AJCC 8th Edition - Pathologic stage from 11/25/2021: Stage IB (pT2, pN1, cM0, G2, ER+, PR+, HER2-) - Signed by Earlie Server, MD on 11/25/2021  Malignant neoplasm of lower-outer quadrant of right breast of female, estrogen receptor positive ([place]) Staging form: Breast, AJCC 8th Edition - Pathologic stage from 11/25/2021: Stage IIIA (pT1c, pN3a, cM0, G2, ER+, PR+, HER2-) - Signed by Earlie Server, MD on 11/25/2021   Malignant neoplasm of left breast [facility]) # Left breast, invasive lobular carcinoma Grade 2, 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  pT2 pN1a, ER +90%, PR +90%, HER2 negative (1+)  # Right Breast invasive lobular carcinoma Grade 2, negative margins, right axillary lymph nodes  30/32 involved with metastatic carcinoma, pT1c pN3a ER +90%, PR +51-90%, HER2 negative (1+)  Patient has poor insights of her condition. Not a candidate for adjuvant IV chemotherapy. Follow up with RadOnc for Adjuvant radiation.  Plan adjuvant endocrine therapy with CKD 4/5 inhibitors after she finishes RT  Severe major depression, single episode, with psychotic features, mood-congruent ([place]) Not on any antidepressants.  Referred to psychiatrist. She has not established care  Thrombocytopenia ([place]) Etiology unknown.  Could be related to mild splenomegaly.  Check B12, folate,  Diabetes mellitus ([place]) Patient does not have primary care provider. A1c has improved.   Continue glipizide and metformin.  She has appt to establish care with PCP in Oct 2023  Malignant neoplasm of lower-outer quadrant of right breast of female, estrogen receptor positive ([place]) See above plan.  Abnormal gastrointestinal PET scan She has been referred to establish care with GI and no showed.  She has appt in Feb 2024   Orders Placed This Encounter  Procedures   CBC with Differential/Platelet    Standing Status:   Future    Standing Expiration Date:   01/11/2023   Comprehensive metabolic panel    Standing Status:   Future    Standing Expiration Date:   01/10/2023   Hemoglobin A1c    Standing Status:   Future    Standing Expiration Date:   01/10/2023   Follow up Lab MD 1 months.  [name], questions or concerns.  Earlie Server, MD, PhD [facility] Hematology Oncology 01/10/2022   CHIEF COMPLAINTS/REASON FOR VISIT:  bilateral breast cancer  HISTORY OF PRESENTING ILLNESS:   Sharon Woods is a  61 y.o.  female presents for follow up of bilateral breast cancer  Oncology History  Malignant neoplasm of left breast ([place])  08/06/2021 Mammogram   08/06/2021, digital bilateral mammogram and ultrasound showed left breast 3:00 mass, 1.7 x 1.7 x 1.7 cm, ultrasound of the left axillary is negative. 08/21/2021 right breast 1 x 0.7 x 0.8 cm angulated spiculated mass at the right breast 7:00, 5 cm from nipple.  Ultrasound of the right axillary demonstrates 3 abnormal thickened cortex lymph node. Patient was recommended to proceed with biopsy left breast 3:00 invasive mammry carcinoma with lobular features. in situ  carcinoma, lobular neoplasia. 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 Franki Cabot right breast 7:00, invasive mammary carcinoma with lobular features, in situ carcinoma,  ER+, PR+, HER 2-This was found to be concordant by Dr. Franki Cabot. right axilla lymph node +,  extracapsular extension.  -This was found to be concordant by Dr. Franki Cabot   08/28/2021 Initial Diagnosis   Malignant neoplasm of upper-outer quadrant of left breast in female, estrogen receptor positive ([place])   08/28/2021 Imaging   CT CHEST, ABDOMEN, AND PELVIS WITH CONTRAST Asymmetric mildly enlarged right axillary nodes. Correlate with biopsy. There are some punctate foci of sclerosis in the included spine that could reflect subtle metastatic disease. Nonobstructing bilateral renal calculi.   08/29/2021 Imaging   BILATERAL BREAST MRI WITH AND WITHOUT CONTRAST 1. 2.5 centimeter enhancing mass in the LOWER OUTER QUADRANT of the RIGHT breast, correlating with known malignancy. 2. At least 4 enlarged RIGHT axillary lymph nodes, correlating well with recently biopsied lymph node showing metastatic disease. 3. 3.4 centimeter enhancing mass in the LEFT breast, with an additional 2.9 centimeters of non mass enhancement posterior to the mass also suspicious for malignancy. This likely correlates with the area calcifications seen mammographically. 4. 5 millimeters satellite nodule along the LATERAL aspect of known malignancy in the LOWER OUTER QUADRANT of the LEFT breast. 5. LEFT axilla is negative for adenopathy.       09/05/2021 Imaging   NUCLEAR MEDICINE WHOLE BODY BONE SCAN Uptake at L2 which is nonspecific but may be related to advanced degenerative disc and facet disease changes at both L1-L2 and L2-L3; no evidence of osseous metastatic disease by CT. No definite osseous metastatic lesions identified.    Genetic Testing   Negative genetic testing. No pathogenic variants identified on the [facility] CancerNext-Expanded+RNA panel. The report date is 10/03/2021.  The CancerNext-Expanded + RNAinsight gene panel offered by [employer] and includes sequencing and rearrangement analysis for the following 77 genes: IP, ALK, APC*, ATM*, AXIN2, BAP1, BARD1, BLM, BMPR1A, BRCA1*, BRCA2*, BRIP1*, CDC73,  CDH1*,CDK4, CDKN1B, CDKN2A, CHEK2*, CTNNA1, DICER1, FANCC, FH, FLCN, GALNT12, KIF1B, LZTR1, MAX, MEN1, MET, MLH1*, MSH2*, MSH3, MSH6*, MUTYH*, NBN, NF1*, NF2, NTHL1, PALB2*, PHOX2B, PMS2*, POT1, PRKAR1A, PTCH1, PTEN*, RAD51C*, RAD51D*,RB1, RECQL, RET, SDHA, SDHAF2, SDHB, SDHC, SDHD, SMAD4, SMARCA4, SMARCB1, SMARCE1, STK11, SUFU, TMEM127, TP53*,TSC1, TSC2, VHL and XRCC2 (sequencing and deletion/duplication); EGFR, EGLN1, HOXB13, KIT, MITF, PDGFRA, POLD1 and POLE (sequencing only); EPCAM and GREM1 (deletion/duplication only).   11/15/2021 Surgery   She underwent left simple mastectomy and right modified mastectomy.   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  pT2 pN1a, ER +90%, PR +90%, HER2 negative (1+)  # Right Breast invasive lobular carcinoma Grade 2, negative margins, right axillary lymph nodes  30/32 involved with metastatic carcinoma, pT1c pN3a ER +90%, PR +51-90%, HER2 negative (1+)    11/25/2021 Cancer Staging   Staging form: Breast, AJCC 8th Edition - Pathologic stage from 11/25/2021: Stage IB (pT2, pN1, cM0, G2, ER+, PR+, HER2-) - Signed by Earlie Server, MD on 11/25/2021 Stage prefix: Initial diagnosis Multigene prognostic tests performed: None Histologic grading system: 3 grade system   Malignant neoplasm of lower-outer quadrant of right breast of female, estrogen receptor positive ([place])  08/06/2021 Mammogram   08/06/2021, digital bilateral mammogram and ultrasound showed left breast 3:00 mass, 1.7 x  1.7 x 1.7 cm, ultrasound of the left axillary is negative. 08/21/2021 right breast 1 x 0.7 x 0.8 cm angulated spiculated mass at the right breast 7:00, 5 cm from nipple.  Ultrasound of the right axillary demonstrates 3 abnormal thickened cortex lymph node. Patient was recommended to  proceed with biopsy left breast 3:00 invasive mammry carcinoma with lobular features. in situ carcinoma, lobular neoplasia. 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 Franki Cabot right breast 7:00, invasive mammary carcinoma with lobular features, in situ carcinoma,  ER+, PR+, HER 2-This was found to be concordant by Dr. Franki Cabot. right axilla lymph node +, extracapsular extension.  -This was found to be concordant by Dr. Franki Cabot   08/28/2021 Initial Diagnosis   Malignant neoplasm of lower-outer quadrant of right breast of female, estrogen receptor positive ([place])   08/28/2021 Imaging   CT CHEST, ABDOMEN, AND PELVIS WITH CONTRAST Asymmetric mildly enlarged right axillary nodes. Correlate with biopsy. There are some punctate foci of sclerosis in the included spine that could reflect subtle metastatic disease. Nonobstructing bilateral renal calculi.   08/29/2021 Imaging   BILATERAL BREAST MRI WITH AND WITHOUT CONTRAST 1. 2.5 centimeter enhancing mass in the LOWER OUTER QUADRANT of the RIGHT breast, correlating with known malignancy. 2. At least 4 enlarged RIGHT axillary lymph nodes, correlating well with recently biopsied lymph node showing metastatic disease. 3. 3.4 centimeter enhancing mass in the LEFT breast, with an additional 2.9 centimeters of non mass enhancement posterior to the mass also suspicious for malignancy. This likely correlates with the area calcifications seen mammographically. 4. 5 millimeters satellite nodule along the LATERAL aspect of known malignancy in the LOWER OUTER QUADRANT of the LEFT breast. 5. LEFT axilla is negative for adenopathy.       09/05/2021 Imaging   NUCLEAR MEDICINE WHOLE BODY BONE SCAN Uptake at L2 which is nonspecific but may be related to advanced degenerative disc and facet disease changes at both L1-L2 and L2-L3; no evidence of osseous metastatic disease by CT. No definite osseous metastatic lesions identified.    09/09/2021 Oncotype testing   [id number] block RIGHT 7:00 5 CM FN; ULTRASOUND-GUIDED BIOPSY Oncotype Dx RS score 22    Genetic Testing   Negative genetic testing. No pathogenic variants identified on the [facility] CancerNext-Expanded+RNA panel. The report date is 10/03/2021.  The CancerNext-Expanded + RNAinsight gene panel offered by [employer] and includes sequencing and rearrangement analysis for the following 77 genes: IP, ALK, APC*, ATM*, AXIN2, BAP1, BARD1, BLM, BMPR1A, BRCA1*, BRCA2*, BRIP1*, CDC73, CDH1*,CDK4, CDKN1B, CDKN2A, CHEK2*, CTNNA1, DICER1, FANCC, FH, FLCN, GALNT12, KIF1B, LZTR1, MAX, MEN1, MET, MLH1*, MSH2*, MSH3, MSH6*, MUTYH*, NBN, NF1*, NF2, NTHL1, PALB2*, PHOX2B, PMS2*, POT1, PRKAR1A, PTCH1, PTEN*, RAD51C*, RAD51D*,RB1, RECQL, RET, SDHA, SDHAF2, SDHB, SDHC, SDHD, SMAD4, SMARCA4, SMARCB1, SMARCE1, STK11, SUFU, TMEM127, TP53*,TSC1, TSC2, VHL and XRCC2 (sequencing and deletion/duplication); EGFR, EGLN1, HOXB13, KIT, MITF, PDGFRA, POLD1 and POLE (sequencing only); EPCAM and GREM1 (deletion/duplication only).   11/15/2021 Surgery   She underwent left simple mastectomy and right modified mastectomy.   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  pT2 pN1a, ER +90%, PR +90%, HER2 negative (1+)  # Right Breast invasive lobular carcinoma Grade 2, negative margins, right axillary lymph nodes  30/32 involved with metastatic carcinoma, pT1c pN3a ER +90%, PR +51-90%, HER2 negative (1+)  11/25/2021 Cancer Staging   Staging form: Breast, AJCC 8th Edition - Pathologic stage from 11/25/2021: Stage IIIA (pT1c, pN3a, cM0, G2, ER+, PR+, HER2-) - Signed by Earlie Server, MD on 11/25/2021 Stage prefix: Initial diagnosis Multigene prognostic tests performed: None Histologic grading  system: 3 grade system   12/10/2021 Imaging   PET scan  1. Interval bilateral mastectomy and right axillary node dissection.No evidence of residual disease in the chest wall, nodal metastases or distant metastases. 2. Focal hypermetabolic activity in the proximal rectum corresponding with an intraluminal polypoid lesion, suspicious for a villous adenoma or early colon cancer. Sigmoidoscopy/colonoscopy recommended unless recently performed   12/10/2021 Imaging   1. Interval bilateral mastectomy and right axillary node dissection.No evidence of residual disease in the chest wall, nodal metastasesor distant metastases. 2. Focal hypermetabolic activity in the proximal rectum corresponding with an intraluminal polypoid lesion, suspicious for a villous adenoma or early colon cancer. Sigmoidoscopy/colonoscopy recommended unless recently performed    She is a poor historian. History of major depression, psychosis, previously on olanzapine and Remeron not currently on any and if not currently following up with psychiatrist. Patient's family history is positive for sister with breast cancer   INTERVAL HISTORY Sharon Woods is a 61 y.o. female who has above history reviewed by me today presents for follow up visit for management of bilateral breast cancer  She has had bilateral mastectomy.  Poor historian. No fever chills. She started on RT. She reports compliant with diabetes treatment.      Review of Systems  Constitutional:  Negative for appetite change, chills, fatigue and fever.  HENT:   Negative for hearing loss and voice change.   Eyes:  Negative for eye problems.  Respiratory:  Negative for chest tightness and cough.   Cardiovascular:  Negative for chest pain.  Gastrointestinal:  Negative for abdominal distention, abdominal pain and blood in stool.  Endocrine: Negative for hot flashes.  Genitourinary:  Negative for difficulty urinating and frequency.   Musculoskeletal:  Negative for  arthralgias.  Skin:  Negative for itching and rash.  Neurological:  Negative for extremity weakness.  Hematological:  Negative for adenopathy.  Psychiatric/Behavioral:  Negative for confusion.   Soreness of the sites of mastectomy  MEDICAL HISTORY:  Past Medical History:  Diagnosis Date   Anxiety    Depression    Diabetes mellitus without complication ([place])    History of high cholesterol 2000   Hypertension    Patient denies medical problems    Thrombocytopenia ([place])     SURGICAL HISTORY: Past Surgical History:  Procedure Laterality Date   ABDOMINAL HYSTERECTOMY     AXILLARY SENTINEL NODE BIOPSY Left 11/15/2021   Procedure: AXILLARY SENTINEL NODE BIOPSY;  Surgeon: [name], MD;  Location: [facility] ORS;  Service: General;  Laterality: Left;   BREAST BIOPSY Right 08/21/2021   [place] bx 7:00 mass coil clip path pending   BREAST BIOPSY Right 08/21/2021   [place] bx of LN, hydro marker, path pending   BREAST BIOPSY Left 08/21/2021   [place] bx, heart marker, path pending   CESAREAN SECTION     x2   MASTECTOMY MODIFIED RADICAL Right 11/15/2021   Procedure: MASTECTOMY MODIFIED RADICAL;  Surgeon: [name], MD;  Location: [facility] ORS;  Service: General;  Laterality: Right;   NO PAST SURGERIES     TOTAL MASTECTOMY Left 11/15/2021   Procedure: TOTAL MASTECTOMY;  Surgeon: [name], MD;  Location: [facility] ORS;  Service: General;  Laterality: Left;    SOCIAL HISTORY:  Social History   Socioeconomic History   Marital status: Married    Spouse name: Not on file   Number of children: Not on file   Years of education: Not on file   Highest education level: Not on file  Occupational History   Not on file  Tobacco Use   Smoking status: Former    Types: Cigarettes    Quit date: 2013    Years since quitting: 10.7   Smokeless tobacco: Never  Vaping Use   Vaping Use: Never used  Substance and Sexual Activity   Alcohol use: Yes    Comment: occasionally   Drug use: No   Sexual  activity: Not Currently    Comment: unable to assess   Other Topics Concern   Not on file  Social History Narrative   Not on file   Social Determinants of Health   Financial Resource Strain: Medium Risk (10/10/2021)   Overall Financial Resource Strain (CARDIA)    Difficulty of Paying Living Expenses: Somewhat hard  Food Insecurity: No Food Insecurity (10/10/2021)   Hunger Vital Sign    Worried About Running Out of Food in the Last Year: Never true    Ran Out of Food in the Last Year: Never true  Transportation Needs: Unmet Transportation Needs (01/10/2022)   PRAPARE - Transportation    Lack of Transportation (Medical): Yes    Lack of Transportation (Non-Medical): Yes  Physical Activity: Inactive (10/10/2021)   Exercise Vital Sign    Days of Exercise per Week: 0 days    Minutes of Exercise per Session: 0 min  Stress: Stress Concern Present (10/10/2021)   [place]    Feeling of Stress : To some extent  Social Connections: Socially Isolated (10/10/2021)   Social Connection and Isolation Panel [NHANES]    Frequency of Communication with Friends and Family: Once a week    Frequency of Social Gatherings with Friends and Family: Never    Attends Religious Services: Never    Printmaker: No    Attends Music therapist: Never    Marital Status: Married  Human resources officer Violence: Not on file    FAMILY HISTORY: Family History  Problem Relation Age of Onset   Lung cancer Mother    Dementia Mother    Parkinson's disease Father    Cancer Father        unk type   Diabetes Sister    Heart attack Sister    Breast cancer Sister    Cancer Maternal Uncle        unk types   Dementia Maternal Grandmother    Cancer Maternal Grandmother        unk type   Cancer Other    Dementia Other     ALLERGIES:  is allergic to tylenol [acetaminophen] and aleve [naproxen].  MEDICATIONS:   Current Outpatient Medications  Medication Sig Dispense Refill   ACCU-CHEK GUIDE test strip USE TO CHECK BLOOD SUGAR UP TO 4 TIMES DAILY AS DIRECTED 100 each 0   Accu-Chek Softclix Lancets lancets USE TO CHECK BLOOD SUAGR UP TO 4 TIMES DAILY AS DIRECTED 100 each 0   blood glucose meter kit and supplies KIT Dispense based on patient and insurance preference. Use up to four times daily as directed. 1 each 1   calcium carbonate (TUMS - DOSED IN MG ELEMENTAL CALCIUM) 500 MG chewable tablet Chew 1 tablet by mouth daily.  glipiZIDE (GLUCOTROL) 5 MG tablet Take 1 tablet (5 mg total) by mouth daily before breakfast. 30 tablet 1   metFORMIN (GLUCOPHAGE) 500 MG tablet Take 1 tablet (500 mg total) by mouth daily. 90 tablet 1   oxyCODONE (OXY IR/ROXICODONE) 5 MG immediate release tablet Take 1 tablet (5 mg total) by mouth every 6 (six) hours as needed for up to 24 doses for severe pain or breakthrough pain. 24 tablet 0   triamcinolone cream (KENALOG) 0.1 % Apply 1 Application topically 2 (two) times daily. 80 g 0   No current facility-administered medications for this visit.     PHYSICAL EXAMINATION: ECOG PERFORMANCE STATUS: 1 - Symptomatic but completely ambulatory Vitals:   01/10/22 1121  BP: (!) 150/84  Pulse: 74  Resp: 18  Temp: 98.2 F (36.8 C)  SpO2: 100%   Filed Weights   01/10/22 1121  Weight: 166 lb (75.3 kg)    Physical Exam Constitutional:      General: She is not in acute distress. HENT:     Head: Normocephalic and atraumatic.  Eyes:     General: No scleral icterus. Cardiovascular:     Rate and Rhythm: Normal rate.  Pulmonary:     Effort: Pulmonary effort is normal. No respiratory distress.     Breath sounds: No wheezing.  Abdominal:     General: There is no distension.  Musculoskeletal:        General: No deformity. Normal range of motion.     Cervical back: Normal range of motion and neck supple.  Skin:    Coloration: Skin is not jaundiced.  Neurological:      Mental Status: She is alert and oriented to person, place, and time. Mental status is at baseline.  Psychiatric:     Comments: Tearful     LABORATORY DATA:  I have reviewed the data as listed Lab Results  Component Value Date   WBC 3.7 (L) 01/10/2022   HGB 12.6 01/10/2022   HCT 39.0 01/10/2022   MCV 91.8 01/10/2022   PLT 75 (L) 01/10/2022   Recent Labs    10/08/21 1201 10/21/21 1025 11/04/21 1326 12/05/21 1308 01/10/22 1107  NA 138 137 140 143 141  K 3.4* 3.8 3.6 3.6 3.5  CL 107 109 106 109 107  CO2 23 24 26 25 26   GLUCOSE 159* 176* 158* 203* 101*  BUN 12 15 14 15 14   CREATININE 0.58 0.60 0.70 0.71 0.68  CALCIUM 8.9 8.8* 9.3 9.1 9.7  GFRNONAA >60 >60 >60 >60 >60  PROT 7.6 7.5  --   --  8.3*  ALBUMIN 4.1 3.9  --   --  4.7  AST 31 26  --   --  26  ALT 25 24  --   --  20  ALKPHOS 68 62  --   --  60  BILITOT 1.0 0.5  --   --  0.7    Iron/TIBC/Ferritin/ %Sat No results found for: "IRON", "TIBC", "FERRITIN", "IRONPCTSAT"    RADIOGRAPHIC STUDIES: I have personally reviewed the radiological images as listed and agreed with the findings in the report. NM PET Image Initial (PI) Skull Base To Thigh  Result Date: 12/10/2021 CLINICAL DATA:  Initial treatment strategy for recently diagnosed bilateral breast cancer. EXAM: NUCLEAR MEDICINE PET SKULL BASE TO THIGH TECHNIQUE: 9.34 mCi F-18 FDG was injected intravenously. Full-ring PET imaging was performed from the skull base to thigh after the radiotracer. CT data was obtained and used for attenuation correction and  anatomic localization. Fasting blood glucose: 93 mg/dl COMPARISON:  Whole-body bone scan 09/04/2021, CT of the chest, abdomen and pelvis 08/28/2021 and abdominopelvic CT 12/04/2020. FINDINGS: Mediastinal blood pool activity: SUV max 1.9 NECK: No hypermetabolic cervical lymph nodes are identified.Fairly symmetric activity within the lymphoid tissue of Waldeyer's ring is within physiologic limits. No suspicious activity  identified within the pharyngeal mucosal space. Incidental CT findings: none CHEST: Interval bilateral mastectomy. Low level activity within the right axilla corresponding with ill-defined low-density attributed to postsurgical change (SUV max 2.7). No suspicious chest wall activity or residual axillary adenopathy. There are no hypermetabolic mediastinal, hilar, axillary or internal mammary lymph nodes. No hypermetabolic pulmonary activity or suspicious nodularity. Incidental CT findings: Interval bilateral mastectomy with a fluid collection or tissue expander in the left breast measuring 6.9 x 2.7 cm on image 104/2. ABDOMEN/PELVIS: There is no hypermetabolic activity within the liver, adrenal glands, spleen or pancreas. There is no hypermetabolic nodal activity in the abdomen or pelvis. There is focally intense (SUV max 6.3) metabolic activity within the proximal rectum, corresponding with a 1.3 cm soft tissue density on image 211/2. No other suspicious bowel activity. Incidental CT findings: Stable mild splenomegaly. Nonobstructing bilateral renal calculi. Mild aortic and branch vessel atherosclerosis. Previous hysterectomy. SKELETON: There is no hypermetabolic activity to suggest osseous metastatic disease. Incidental CT findings: Degenerative changes in the spine associated with a convex right thoracolumbar scoliosis. Punctate sclerotic lesions in the spine are unchanged and likely incidental bone islands. IMPRESSION: 1. Interval bilateral mastectomy and right axillary node dissection. No evidence of residual disease in the chest wall, nodal metastases or distant metastases. 2. Focal hypermetabolic activity in the proximal rectum corresponding with an intraluminal polypoid lesion, suspicious for a villous adenoma or early colon cancer. Sigmoidoscopy/colonoscopy recommended unless recently performed. Electronically Signed   By: Richardean Sale M.D.   On: 12/10/2021 13:28   NM Sentinel Node Inj-No Rpt  (Breast)  Result Date: 11/15/2021 Sulfur Colloid was injected by the Nuclear Medicine Technologist for sentinel lymph node localization.

## 2022-01-11 LAB — CANCER ANTIGEN 27.29: CA 27.29: 16.8 U/mL (ref 0.0–38.6)

## 2022-01-11 LAB — CANCER ANTIGEN 15-3: CA 15-3: 18.3 U/mL (ref 0.0–25.0)

## 2022-01-13 ENCOUNTER — Ambulatory Visit
Admission: RE | Admit: 2022-01-13 | Discharge: 2022-01-13 | Disposition: A | Discharge status: Home / Self Care | Payer: Medicaid Other | Source: Ambulatory Visit | Attending: Radiation Oncology | Admitting: Radiation Oncology

## 2022-01-13 ENCOUNTER — Other Ambulatory Visit: Payer: Self-pay

## 2022-01-13 ENCOUNTER — Inpatient Hospital Stay: Payer: Medicaid Other | Admitting: Oncology

## 2022-01-13 ENCOUNTER — Inpatient Hospital Stay: Payer: Medicaid Other

## 2022-01-13 DIAGNOSIS — C50511 Malignant neoplasm of lower-outer quadrant of right female breast: Secondary | ICD-10-CM | POA: Diagnosis not present

## 2022-01-13 DIAGNOSIS — Z170 Estrogen receptor positive status [ER+]: Secondary | ICD-10-CM | POA: Diagnosis not present

## 2022-01-13 DIAGNOSIS — Z510 Encounter for antineoplastic radiation therapy: Secondary | ICD-10-CM | POA: Diagnosis not present

## 2022-01-13 DIAGNOSIS — C50412 Malignant neoplasm of upper-outer quadrant of left female breast: Secondary | ICD-10-CM | POA: Diagnosis not present

## 2022-01-13 LAB — RAD ONC ARIA SESSION SUMMARY
Course Elapsed Days: 4
Plan Fractions Treated to Date: 3
Plan Fractions Treated to Date: 3
Plan Prescribed Dose Per Fraction: 1.8 Gy
Plan Prescribed Dose Per Fraction: 1.8 Gy
Plan Total Fractions Prescribed: 28
Plan Total Fractions Prescribed: 28
Plan Total Prescribed Dose: 50.4 Gy
Plan Total Prescribed Dose: 50.4 Gy
Reference Point Dosage Given to Date: 5.4 Gy
Reference Point Dosage Given to Date: 5.4 Gy
Reference Point Session Dosage Given: 1.8 Gy
Reference Point Session Dosage Given: 1.8 Gy
Session Number: 3

## 2022-01-14 ENCOUNTER — Other Ambulatory Visit: Payer: Self-pay

## 2022-01-14 ENCOUNTER — Ambulatory Visit
Admission: RE | Admit: 2022-01-14 | Discharge: 2022-01-14 | Disposition: A | Discharge status: Home / Self Care | Payer: Medicaid Other | Source: Ambulatory Visit | Attending: Radiation Oncology | Admitting: Radiation Oncology

## 2022-01-14 DIAGNOSIS — Z170 Estrogen receptor positive status [ER+]: Secondary | ICD-10-CM | POA: Diagnosis not present

## 2022-01-14 DIAGNOSIS — Z510 Encounter for antineoplastic radiation therapy: Secondary | ICD-10-CM | POA: Diagnosis not present

## 2022-01-14 DIAGNOSIS — C50412 Malignant neoplasm of upper-outer quadrant of left female breast: Secondary | ICD-10-CM | POA: Diagnosis not present

## 2022-01-14 DIAGNOSIS — C50511 Malignant neoplasm of lower-outer quadrant of right female breast: Secondary | ICD-10-CM | POA: Diagnosis not present

## 2022-01-14 LAB — RAD ONC ARIA SESSION SUMMARY

## 2022-01-15 ENCOUNTER — Other Ambulatory Visit: Payer: Self-pay

## 2022-01-15 ENCOUNTER — Ambulatory Visit: Payer: Medicaid Other | Admitting: Physical Therapy

## 2022-01-15 ENCOUNTER — Ambulatory Visit
Admission: RE | Admit: 2022-01-15 | Discharge: 2022-01-15 | Disposition: A | Discharge status: Home / Self Care | Payer: Medicaid Other | Source: Ambulatory Visit | Attending: Radiation Oncology | Admitting: Radiation Oncology

## 2022-01-15 DIAGNOSIS — C50412 Malignant neoplasm of upper-outer quadrant of left female breast: Secondary | ICD-10-CM | POA: Diagnosis not present

## 2022-01-15 DIAGNOSIS — Z510 Encounter for antineoplastic radiation therapy: Secondary | ICD-10-CM | POA: Diagnosis not present

## 2022-01-15 DIAGNOSIS — M25611 Stiffness of right shoulder, not elsewhere classified: Secondary | ICD-10-CM | POA: Diagnosis not present

## 2022-01-15 DIAGNOSIS — C50511 Malignant neoplasm of lower-outer quadrant of right female breast: Secondary | ICD-10-CM | POA: Diagnosis not present

## 2022-01-15 DIAGNOSIS — M25612 Stiffness of left shoulder, not elsewhere classified: Secondary | ICD-10-CM

## 2022-01-15 DIAGNOSIS — M546 Pain in thoracic spine: Secondary | ICD-10-CM

## 2022-01-15 DIAGNOSIS — Z170 Estrogen receptor positive status [ER+]: Secondary | ICD-10-CM | POA: Diagnosis not present

## 2022-01-15 LAB — RAD ONC ARIA SESSION SUMMARY

## 2022-01-15 NOTE — Therapy (Signed)
OUTPATIENT PHYSICAL THERAPY SHOULDER TREATMENT   Patient Name: Sharon Woods MRN: [id number] DOB:January 02, 1961, 61 y.o., female Today's Date: 01/15/2022   PT End of Session - 01/15/22 1002     Visit Number 5    Number of Visits 17    Date for PT Re-Evaluation 02/21/22    Authorization Type [place] Medicaid [employer]    Authorization Time Period Authorizaed for 12/23/21-03/23/22 for 13 viists; Certification for 3/71/06-26/9/48;    Authorization - Visit Number 5    Authorization - Number of Visits 13    Progress Note Due on Visit 10    PT Start Time 1004    PT Stop Time 1045    PT Time Calculation (min) 41 min    Activity Tolerance Patient tolerated treatment well;No increased pain    Behavior During Therapy WFL for tasks assessed/performed               Past Medical History:  Diagnosis Date   Anxiety    Depression    Diabetes mellitus without complication ([place])    History of high cholesterol 2000   Hypertension    Patient denies medical problems    Thrombocytopenia ([place])    Past Surgical History:  Procedure Laterality Date   ABDOMINAL HYSTERECTOMY     AXILLARY SENTINEL NODE BIOPSY Left 11/15/2021   Procedure: AXILLARY SENTINEL NODE BIOPSY;  Surgeon: [name], MD;  Location: [facility] ORS;  Service: General;  Laterality: Left;   BREAST BIOPSY Right 08/21/2021   [place] bx 7:00 mass coil clip path pending   BREAST BIOPSY Right 08/21/2021   [place] bx of LN, hydro marker, path pending   BREAST BIOPSY Left 08/21/2021   [place] bx, heart marker, path pending   CESAREAN SECTION     x2   MASTECTOMY MODIFIED RADICAL Right 11/15/2021   Procedure: MASTECTOMY MODIFIED RADICAL;  Surgeon: [name], MD;  Location: [facility] ORS;  Service: General;  Laterality: Right;   NO PAST SURGERIES     TOTAL MASTECTOMY Left 11/15/2021   Procedure: TOTAL MASTECTOMY;  Surgeon: [name], MD;  Location: [facility] ORS;  Service: General;  Laterality: Left;   Patient Active Problem List   Diagnosis  Date Noted   Abnormal gastrointestinal PET scan 01/10/2022   Diabetes mellitus ([place]) 12/05/2021   Status post bilateral mastectomy 11/21/2021   Bilateral breast cancer ([place]) 11/15/2021   Genetic testing 10/07/2021   Vaginitis 09/28/2021   Thrombocytopenia ([place]) 09/08/2021   Malignant neoplasm of left breast ([place]) 08/28/2021   Malignant neoplasm of lower-outer quadrant of right breast of female, estrogen receptor positive ([place]) 08/28/2021   Goals of care, counseling/discussion 08/28/2021   Severe recurrent major depression with psychotic features ([place]) 11/15/2015   Hypertension 11/15/2015   Noncompliance 11/15/2015   Severe major depression, single episode, with psychotic features, mood-congruent ([place]) 03/29/2015   Protein-calorie malnutrition, severe 03/26/2015   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    PCP: No PCP  REFERRING PROVIDER: [name] MD  REFERRING DIAG: Decreased bilat shoulder ROM post double masectomy  THERAPY DIAG:  Stiffness of right shoulder, not elsewhere classified  Stiffness of left shoulder, not elsewhere classified  Pain in thoracic spine  Rationale for Evaluation and Treatment Rehabilitation  ONSET DATE: August 18th 2023  SUBJECTIVE:  SUBJECTIVE STATEMENT: Pt reports she is having radiation 5 or 6 days a week. She does not like having radiation. Reports she is tired from this. She reports her shoulder mobility is doing well with radiation demands. No pain in the shoulders today, some pain under her arms from radiation. Has not been completing HEP due to being tired.    PERTINENT HISTORY: Pt is a 61 year old female presenting with bilat decreased shoulder ROM following double masectomy 11/15/21. Not currently under any cancer treatments, is seeking further treatments. No lymphedema, or  treatment for this at this time. Reports since surgery she has decreased overhead motion. She has bilat pain at the axilla of her Ues. Pain currently 5/10; lowest pain score 3/10; worst pain 9/10. The pain she feels is sharp and she also has numbness/tingling in the axilla across the top of bilat breasts. Pain is aggravated by reaching overhead, reaching behind her back, pulling, pushing... really anything I do with my arms. Laying on her back hurts as well. Pain is relieved by tylenol, pain medication , and ice. 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 Patient does not have a license or vehicle, relies on family and public transit. Pt denies N/V, B&B changes, unexplained weight fluctuation, saddle paresthesia, fever, night sweats, or unrelenting night pain at this time.  Pt poor historian, seems to not have understanding of current condition.  PMH gathered from chart review: 11/05/16 L simple mastectomy and R modified mastectomy following stage 2 breast cancer diagnosis with lymphnode involvement; recent cellulitis from drainage with course of antibiotics.   PAIN:  Are you having pain? Yes: NPRS scale: 4/10 Pain location: bilat axilla Pain description: tingling, sharp Aggravating factors: reaching overhead, reaching behind her back, pulling, pushing... really anything I do with my arms. Laying on her back hurts as well.  Relieving factors: tylenol, pain medication , and ice  PRECAUTIONS: None   OBJECTIVE:   UPPER EXTREMITY ROM:   Active /PROM at eval Right eval Left eval  Shoulder flexion 104/146 106/144  Shoulder extension WNL WNL  Shoulder abduction 92/106 119/135  Shoulder internal rotation (apleys) T12/WNL T12/WNL  Shoulder external rotation (apleys) C5/WNL CTJ/WNL  Elbow flexion WNL WNL  Elbow extension WNL WNL  (Blank rows = not tested)      TODAY'S TREATMENT: There.ex:  Nu Step L3 for 5 minutes  seat 6 UE 12 for gentle protraction <> retraction strengthening/movement Use of towel roll along thoracic spine to improve trunk rotation.   Seated thoracic extension over half bolster with BUE flex with dowel AAROM  Overhead wt'd ball bounce on wall 2x 12 bilat with good eventual carry over of demo   Y on wall 2# DB 2x 12 with good carry over of scapulohumeral rhythm; more difficulty with LUE  Squat with bilat 2# DB overhead press 2x 6 with concordant demo for technique; more difficulty with LUE raise  Lat stretch seated 30sec hold Pec doorway stretch 30sec hold    PATIENT EDUCATION: Education details: Patient was educated on diagnosis, anatomy and pathology involved, prognosis, role of PT, and was given an HEP, demonstrating exercise with proper form following verbal and tactile cues, and was given a paper hand out to continue exercise at home. Pt was educated on and agreed to plan of care.  Person educated: Patient Education method: Explanation, Demonstration, and Handouts Education comprehension: verbalized understanding, returned demonstration, and tactile cues required  HOME EXERCISE PROGRAM: Access Code: [email or web address] URL: [email or web address] Date: 12/25/2021 Prepared by: Rebbeca Paul  Exercises - Seated Thoracic Lumbar Extension with Pectoralis Stretch  - 3 x daily - 7 x weekly - 12 reps - 2sec hold - Seated Shoulder Abduction Towel Slide at Table Top  - 3 x daily - 7 x weekly - 1 sets - 3 reps - 30 hold - Seated Shoulder Abduction AAROM with Pulley Behind  - 3 x daily - 7 x weekly - 12 reps - 2sec hold - Seated Shoulder Flexion AAROM with Pulley Behind  - 3 x daily - 7 x weekly - 12 reps - 2sec hold  ASSESSMENT:  CLINICAL IMPRESSION: Continuing PT POC with focus on improving thoracic, cervical and shoulder mobility to assist in return to full, shoulder AROM and decreased pain. PT increased overhead progression for increased time spent in active overhead  poisitions with patient able to comply very well, more difficulty/fatigue with LUE than RUE. No exacerbation of pain noted during session. Pt is able to comply with all cuing for proper technique of therex with good effort throughout session. No pain throughout session. Pt will continue to benefit from skilled PT services to address remaining deficits to return to PLOF.    OBJECTIVE IMPAIRMENTS decreased activity tolerance, decreased coordination, decreased endurance, decreased mobility, decreased ROM, decreased strength, increased muscle spasms, impaired flexibility, impaired tone, impaired UE functional use, improper body mechanics, postural dysfunction, and pain.   ACTIVITY LIMITATIONS carrying, lifting, sleeping, transfers, bathing, dressing, reach over head, and hygiene/grooming  PARTICIPATION LIMITATIONS: meal prep, cleaning, laundry, shopping, community activity, and occupation  PERSONAL FACTORS Age, Education, Fitness, Past/current experiences, Sex, Social background, Time since onset of injury/illness/exacerbation, Transportation, and 3+ comorbidities: A&D, DB, HTN, history of cancer  are also affecting patient's functional outcome.   REHAB POTENTIAL: Good  CLINICAL DECISION MAKING: Evolving/moderate complexity  EVALUATION COMPLEXITY: Moderate   GOALS: Goals reviewed with patient? Yes  SHORT TERM GOALS: Target date: 02/12/2022  (Remove Blue Hyperlink)  Pt will be independent with HEP in order to improve strength and balance in order to decrease fall risk and improve function at home and work. Baseline: 12/18/21 HEP given  Goal status: INITIAL    LONG TERM GOALS: Target date: 03/12/2022  (Remove Blue Hyperlink)  Pt will decrease worst pain as reported on NPRS by at least 3 points in order to demonstrate clinically significant reduction in pain.  Baseline: 12/21/21 9/10 Goal status: INITIAL  2.  Patient will increase FOTO score to 61 to demonstrate predicted increase in  functional mobility to complete ADLs  Baseline: 12/21/21 40 Goal status: INITIAL  3.  Pt will demonstrate 4+/5 gross periscapular MMT in order to complete heavy household ADLs Baseline: 12/21/21 Y lower trap 3+/4- ; T Scapular retractors 4-/4 Goal status: INITIAL  4.  Pt will demonstrate full shoulder ROM in order to complete household and self care ADLs Baseline: R/L flex 104/106 abd: 92/119 ER: C5/WNL Goal status: INITIAL    PLAN: PT FREQUENCY: 2x/week  PT DURATION: 8 weeks  PLANNED INTERVENTIONS: Therapeutic exercises, Therapeutic activity, Neuromuscular re-education, Balance training, Gait training, Patient/Family education, Self Care, Joint mobilization, Joint manipulation, DME instructions, Aquatic Therapy, Dry Needling, Electrical stimulation, Spinal manipulation, Spinal mobilization, Cryotherapy, Moist heat, Traction, Ultrasound, Manual therapy, and Re-evaluation  PLAN FOR NEXT SESSION: progress shoulder mobility.    Durwin Reges DPT Physical Therapist- [facility]  01/15/2022, 10:45 AM

## 2022-01-16 ENCOUNTER — Other Ambulatory Visit: Payer: Self-pay

## 2022-01-16 ENCOUNTER — Ambulatory Visit
Admission: RE | Admit: 2022-01-16 | Discharge: 2022-01-16 | Disposition: A | Discharge status: Home / Self Care | Payer: Medicaid Other | Source: Ambulatory Visit | Attending: Radiation Oncology | Admitting: Radiation Oncology

## 2022-01-16 DIAGNOSIS — Z170 Estrogen receptor positive status [ER+]: Secondary | ICD-10-CM | POA: Diagnosis not present

## 2022-01-16 DIAGNOSIS — C50511 Malignant neoplasm of lower-outer quadrant of right female breast: Secondary | ICD-10-CM | POA: Diagnosis not present

## 2022-01-16 DIAGNOSIS — Z510 Encounter for antineoplastic radiation therapy: Secondary | ICD-10-CM | POA: Diagnosis not present

## 2022-01-16 DIAGNOSIS — C50412 Malignant neoplasm of upper-outer quadrant of left female breast: Secondary | ICD-10-CM | POA: Diagnosis not present

## 2022-01-16 LAB — RAD ONC ARIA SESSION SUMMARY

## 2022-01-17 ENCOUNTER — Ambulatory Visit
Admission: RE | Admit: 2022-01-17 | Discharge: 2022-01-17 | Disposition: A | Discharge status: Home / Self Care | Payer: Medicaid Other | Source: Ambulatory Visit | Attending: Radiation Oncology | Admitting: Radiation Oncology

## 2022-01-17 ENCOUNTER — Other Ambulatory Visit: Payer: Self-pay

## 2022-01-17 DIAGNOSIS — Z170 Estrogen receptor positive status [ER+]: Secondary | ICD-10-CM | POA: Diagnosis not present

## 2022-01-17 DIAGNOSIS — C50511 Malignant neoplasm of lower-outer quadrant of right female breast: Secondary | ICD-10-CM | POA: Diagnosis not present

## 2022-01-17 DIAGNOSIS — C50412 Malignant neoplasm of upper-outer quadrant of left female breast: Secondary | ICD-10-CM | POA: Diagnosis not present

## 2022-01-17 DIAGNOSIS — Z510 Encounter for antineoplastic radiation therapy: Secondary | ICD-10-CM | POA: Diagnosis not present

## 2022-01-17 LAB — RAD ONC ARIA SESSION SUMMARY

## 2022-01-20 ENCOUNTER — Encounter: Payer: Self-pay | Admitting: Internal Medicine

## 2022-01-20 ENCOUNTER — Other Ambulatory Visit: Payer: Self-pay

## 2022-01-20 ENCOUNTER — Ambulatory Visit
Admission: RE | Admit: 2022-01-20 | Discharge: 2022-01-20 | Disposition: A | Discharge status: Home / Self Care | Payer: Medicaid Other | Source: Ambulatory Visit | Attending: Radiation Oncology | Admitting: Radiation Oncology

## 2022-01-20 ENCOUNTER — Ambulatory Visit (INDEPENDENT_AMBULATORY_CARE_PROVIDER_SITE_OTHER): Payer: Medicaid Other | Admitting: Internal Medicine

## 2022-01-20 VITALS — BP 122/72 | HR 82 | Temp 98.3°F | Resp 16 | Ht 61.0 in | Wt 167.7 lb

## 2022-01-20 DIAGNOSIS — C50412 Malignant neoplasm of upper-outer quadrant of left female breast: Secondary | ICD-10-CM | POA: Diagnosis not present

## 2022-01-20 DIAGNOSIS — C50511 Malignant neoplasm of lower-outer quadrant of right female breast: Secondary | ICD-10-CM | POA: Diagnosis not present

## 2022-01-20 DIAGNOSIS — E1169 Type 2 diabetes mellitus with other specified complication: Secondary | ICD-10-CM

## 2022-01-20 DIAGNOSIS — Z748 Other problems related to care provider dependency: Secondary | ICD-10-CM

## 2022-01-20 DIAGNOSIS — F331 Major depressive disorder, recurrent, moderate: Secondary | ICD-10-CM

## 2022-01-20 DIAGNOSIS — C50911 Malignant neoplasm of unspecified site of right female breast: Secondary | ICD-10-CM

## 2022-01-20 DIAGNOSIS — N949 Unspecified condition associated with female genital organs and menstrual cycle: Secondary | ICD-10-CM | POA: Diagnosis not present

## 2022-01-20 DIAGNOSIS — E785 Hyperlipidemia, unspecified: Secondary | ICD-10-CM | POA: Diagnosis not present

## 2022-01-20 DIAGNOSIS — C50912 Malignant neoplasm of unspecified site of left female breast: Secondary | ICD-10-CM

## 2022-01-20 DIAGNOSIS — Z510 Encounter for antineoplastic radiation therapy: Secondary | ICD-10-CM | POA: Diagnosis not present

## 2022-01-20 DIAGNOSIS — Z170 Estrogen receptor positive status [ER+]: Secondary | ICD-10-CM | POA: Diagnosis not present

## 2022-01-20 LAB — RAD ONC ARIA SESSION SUMMARY
Course Elapsed Days: 11
Plan Fractions Treated to Date: 8
Plan Fractions Treated to Date: 8
Plan Prescribed Dose Per Fraction: 1.8 Gy
Plan Prescribed Dose Per Fraction: 1.8 Gy
Plan Total Fractions Prescribed: 28
Plan Total Fractions Prescribed: 28
Plan Total Prescribed Dose: 50.4 Gy
Plan Total Prescribed Dose: 50.4 Gy
Reference Point Dosage Given to Date: 14.4 Gy
Reference Point Dosage Given to Date: 14.4 Gy
Reference Point Session Dosage Given: 1.8 Gy
Reference Point Session Dosage Given: 1.8 Gy
Session Number: 8

## 2022-01-20 MED ORDER — ESCITALOPRAM OXALATE 5 MG PO TABS: 5.0000 mg | ORAL_TABLET | Freq: Every day | ORAL | 1 refills | Status: DC

## 2022-01-20 MED ORDER — METFORMIN HCL 500 MG PO TABS: 500.0000 mg | ORAL_TABLET | Freq: Every day | ORAL | 1 refills | Status: DC

## 2022-01-20 MED ORDER — CLOTRIMAZOLE 1 % EX CREA: 1.0000 | TOPICAL_CREAM | Freq: Two times a day (BID) | CUTANEOUS | 0 refills | Status: DC

## 2022-01-20 NOTE — Progress Notes (Signed)
New Patient Office Visit  Subjective    Patient ID: Sharon Woods, female    DOB: 08-Oct-1960  Age: 61 y.o. MRN: [id number]  CC:  Chief Complaint  Patient presents with   Establish Care    HPI Sharon Woods presents to establish care.  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 Last treatment 03/20/22 -Family history of mother, father and sister with breast cancer  Diabetes, Type 2: -Last A1c 5.2, 10/23 -Medications: Metformin 500 mg, glipizide 5 mg -Patient is compliant with the above medications and reports no side effects.  -Checking BG at home: No cannot afford test strips -Eye exam: We will discuss at follow-up -Foot exam: Due at follow-up -Microalbumin: Due -Statin: No -PNA vaccine: Discuss at follow-up -Denies symptoms of hypoglycemia, polyuria, polydipsia, numbness extremities, foot ulcers/trauma.   History of questionable hypertension: -Medications: Nothing --Blood pressure here well controlled  HLD: -Medications: Nothing currently -Last lipid panel: Lipid Panel     Component Value Date/Time   CHOL 255 (H) 04/16/2015 0659   TRIG 236 (H) 04/16/2015 0659   HDL 69 04/16/2015 0659   CHOLHDL 3.7 04/16/2015 0659   VLDL 47 (H) 04/16/2015 0659   LDLCALC 139 (H) 04/16/2015 4098    MDD: -Mood status: uncontrolled -Current treatment: Nothing -Previous psychiatric medications: prozac, seroquel, and zyprexa -patient states that she has been on medications for depression in the past, however her "husband did not want her to be on them" and she felt like a "zombie" so she discontinued them however she also states that she is under a lot of stress both at home and with  her health Depressed mood: yes     01/20/2022    2:14 PM 10/10/2021    3:06 PM 10/10/2021    3:04 PM 10/10/2021    3:03 PM  Depression screen PHQ 2/9  Decreased Interest 0 1 0 1  Down, Depressed, Hopeless 1 0 0 1  PHQ - 2 Score 1 1 0 2  Altered sleeping 2 0  0  Tired, decreased energy _0 Change in appetite 2 1  0  Feeling bad or failure about yourself  0 0  0  Trouble concentrating 0 0  1  Moving slowly or fidgety/restless 0 1  0  Suicidal thoughts 0 0  0  PHQ-9 Score _1 Difficult doing work/chores Somewhat difficult Somewhat difficult  Somewhat difficult   Vaginal irritation: -Patient states she is having some vaginal discomfort more on the outside -Patient complaining of bilateral flank pain but denies dysuria, hematuria or increased urinary urgency or frequency.  Also denies changes in vaginal discharge.  Health maintenance: -Blood work up-to-date -Colon cancer screening due  Outpatient Encounter Medications as of 01/20/2022  Medication Sig   glipiZIDE (GLUCOTROL) 5 MG tablet Take 1 tablet (5 mg total) by mouth daily before breakfast.   metFORMIN (GLUCOPHAGE) 500 MG tablet Take 1 tablet (500 mg total) by mouth daily.   oxyCODONE (OXY IR/ROXICODONE) 5 MG immediate release tablet Take 1 tablet (5 mg total) by mouth every 6 (six) hours as needed for up to 24 doses for severe pain or breakthrough pain.   ACCU-CHEK GUIDE test strip USE TO CHECK BLOOD SUGAR  UP TO 4 TIMES DAILY AS DIRECTED   Accu-Chek Softclix Lancets lancets USE TO CHECK BLOOD SUAGR UP TO 4 TIMES DAILY AS DIRECTED   blood glucose meter kit and supplies KIT Dispense based on patient and insurance preference. Use up to four times daily as directed.   [DISCONTINUED] calcium carbonate (TUMS - DOSED IN MG ELEMENTAL CALCIUM) 500 MG chewable tablet Chew 1 tablet by mouth daily.   [DISCONTINUED] triamcinolone cream (KENALOG) 0.1 % Apply 1 Application topically 2 (two) times daily.   No facility-administered  encounter medications on file as of 01/20/2022.    Past Medical History:  Diagnosis Date   Anxiety    Cancer ([place])    Depression    Diabetes mellitus without complication ([place])    History of high cholesterol 2000   Hyperlipidemia    Hypertension    Patient denies medical problems    Thrombocytopenia ([place])     Past Surgical History:  Procedure Laterality Date   ABDOMINAL HYSTERECTOMY     AXILLARY SENTINEL NODE BIOPSY Left 11/15/2021   Procedure: AXILLARY SENTINEL NODE BIOPSY;  Surgeon: [name], MD;  Location: [facility] ORS;  Service: General;  Laterality: Left;   BREAST BIOPSY Right 08/21/2021   [place] bx 7:00 mass coil clip path pending   BREAST BIOPSY Right 08/21/2021   [place] bx of LN, hydro marker, path pending   BREAST BIOPSY Left 08/21/2021   [place] bx, heart marker, path pending   CESAREAN SECTION     x2   MASTECTOMY MODIFIED RADICAL Right 11/15/2021   Procedure: MASTECTOMY MODIFIED RADICAL;  Surgeon: [name], MD;  Location: [facility] ORS;  Service: General;  Laterality: Right;   NO PAST SURGERIES     TOTAL MASTECTOMY Left 11/15/2021   Procedure: TOTAL MASTECTOMY;  Surgeon: [name], MD;  Location: [facility] ORS;  Service: General;  Laterality: Left;    Family History  Problem Relation Age of Onset   Lung cancer Mother    Dementia Mother    Parkinson's disease Father    Cancer Father        unk type   Diabetes Sister    Heart attack Sister    Breast cancer Sister    Cancer Maternal Uncle        unk types   Dementia Maternal Grandmother    Cancer Maternal Grandmother        unk type   Cancer Other    Dementia Other     Social History   Socioeconomic History   Marital status: Married    Spouse name: Not on file   Number of children: Not on file   Years of education: Not on file   Highest education level: Not on file  Occupational History   Not on file  Tobacco Use   Smoking status: Former    Types: Cigarettes    Quit date: 2013    Years since  quitting: 10.8   Smokeless tobacco: Never  Vaping Use   Vaping Use: Never used  Substance and Sexual Activity   Alcohol use: Not Currently    Comment: occasionally   Drug use: No   Sexual activity: Not Currently    Comment: unable to assess   Other Topics Concern   Not on file  Social History Narrative   Not on file   Social Determinants of Health   Financial Resource Strain: Medium Risk (10/10/2021)   Overall Financial Resource Strain (CARDIA)    Difficulty of Paying Living Expenses: Somewhat hard  Food Insecurity: No Food Insecurity (10/10/2021)   Hunger Vital Sign    Worried About Running Out of Food in the Last Year: Never true    Ran Out of Food in the Last Year: Never true  Transportation Needs: Unmet Transportation Needs (01/10/2022)   PRAPARE - Transportation    Lack of Transportation (Medical): Yes    Lack of Transportation (Non-Medical): Yes  Physical Activity: Inactive (10/10/2021)   Exercise Vital Sign    Days of Exercise per Week: 0 days    Minutes of Exercise per Session: 0 min  Stress: Stress Concern Present (10/10/2021)   [place]    Feeling of Stress : To some extent  Social Connections: Socially Isolated (10/10/2021)   Social Connection and Isolation Panel [NHANES]    Frequency of Communication with Friends and Family: Once a week    Frequency of Social Gatherings with Friends and Family: Never    Attends Religious Services: Never    Marine scientist or Organizations: No    Attends Archivist Meetings: Never    Marital Status: Married  Human resources officer Violence: Not on file    Review of Systems  Constitutional:  Negative for chills and fever.  Eyes:  Negative for blurred vision.  Respiratory:  Negative for shortness of breath.   Cardiovascular:  Negative for chest pain.  Genitourinary:  Positive for flank pain. Negative for dysuria, frequency, hematuria and urgency.   Psychiatric/Behavioral:  Positive for depression.       Objective    BP 122/72   Pulse 82   Temp 98.3 F (36.8 C)   Resp 16   Ht _0  (1.549 m)   Wt 167 lb 11.2 oz (76.1 kg)   LMP  (LMP Unknown) Comment: Patient is not a good historian  SpO2 99%   BMI 31.69 kg/m   Physical Exam Constitutional:      Appearance: Normal appearance.     Comments: Poor historian   HENT:     Head: Normocephalic and atraumatic.  Eyes:     Conjunctiva/sclera: Conjunctivae normal.  Cardiovascular:     Rate and Rhythm: Normal rate and regular rhythm.  Pulmonary:     Effort: Pulmonary effort is normal.     Breath sounds: Normal breath sounds.  Skin:    General: Skin is warm and dry.  Neurological:     General: No focal deficit present.     Mental Status: She is alert. Mental status is at baseline.  Psychiatric:        Mood and Affect: Mood normal.         Assessment & Plan:   1. Type 2 diabetes mellitus with other specified complication, without long-term current use of insulin ([place]): Patient's last A1c 5.2%.  Patient states she is lost weight and has been working on her diet and only buying sugar-free snacks.  We will discontinue glipizide for now and continue metformin 500 mg once daily, this will be sent to her pharmacy.  Urine microalbumin today as well.  We will need to do foot exam at follow-up.  - metFORMIN (GLUCOPHAGE) 500 MG tablet; Take 1 tablet (500 mg total) by mouth daily.  Dispense: 90 tablet; Refill: 1 - Urine Microalbumin w/creat. ratio  2. Moderate episode of recurrent major depressive disorder [facility]): Patient states she is easily agitated and dealing with depression symptoms at home, however she has had side effects with other mental health medications in the  past.  Patient is willing to try Lexapro 5 mg with plans to follow-up in 6 weeks.  - escitalopram (LEXAPRO) 5 MG tablet; Take 1 tablet (5 mg total) by mouth daily.  Dispense: 30 tablet; Refill: 1  3. Vaginal  discomfort: Urine sample showing blood and leukocytes, will send for urine culture.  It sounds like she is having some vulvar discomfort and itching, will send cream to pharmacy for this as well.  - POCT Urinalysis Dipstick - Urine Culture - clotrimazole (LOTRIMIN) 1 % cream; Apply 1 Application topically 2 (two) times daily.  Dispense: 30 g; Refill: 0  4. Hyperlipidemia, unspecified hyperlipidemia type: Last cholesterol checked in 2017, plan to recheck at follow-up or when other labs are due.  5. Bilateral malignant neoplasm of breast in female, unspecified estrogen receptor status, unspecified site of breast [facility]): Following with oncology, note from 01/10/2022 reviewed.  Patient is currently undergoing radiation therapy and is s/p bilateral mastectomy in August.  6. Assistance with transportation: Referral to social work placed to help with transportation needs, patient does not have her driver's license and cannot drive.  - AMB Referral to [place] [facility]  Return in about 6 weeks (around 03/03/2022).   Teodora Medici, DO

## 2022-01-20 NOTE — Patient Instructions (Addendum)
It was great seeing you today!  Plan discussed at today's visit: -Urine test today -Stop taking Glipizide but continue Metformin  -New medication for depression, Lexapro sent to pharmacy take every day because it can take a few weeks to build up and help your symptoms  Follow up in: 6 weeks  Take care and let us know if you have any questions or concerns prior to your next visit.  Dr. Rosana Berger

## 2022-01-21 ENCOUNTER — Telehealth: Payer: Self-pay

## 2022-01-21 ENCOUNTER — Other Ambulatory Visit: Payer: Self-pay

## 2022-01-21 ENCOUNTER — Encounter: Payer: Self-pay | Admitting: *Deleted

## 2022-01-21 ENCOUNTER — Ambulatory Visit
Admission: RE | Admit: 2022-01-21 | Discharge: 2022-01-21 | Disposition: A | Discharge status: Home / Self Care | Payer: Medicaid Other | Source: Ambulatory Visit | Attending: Radiation Oncology | Admitting: Radiation Oncology

## 2022-01-21 DIAGNOSIS — Z170 Estrogen receptor positive status [ER+]: Secondary | ICD-10-CM | POA: Diagnosis not present

## 2022-01-21 DIAGNOSIS — C50511 Malignant neoplasm of lower-outer quadrant of right female breast: Secondary | ICD-10-CM | POA: Diagnosis not present

## 2022-01-21 DIAGNOSIS — Z510 Encounter for antineoplastic radiation therapy: Secondary | ICD-10-CM | POA: Diagnosis not present

## 2022-01-21 DIAGNOSIS — C50412 Malignant neoplasm of upper-outer quadrant of left female breast: Secondary | ICD-10-CM | POA: Diagnosis not present

## 2022-01-21 LAB — RAD ONC ARIA SESSION SUMMARY
Course Elapsed Days: 12
Plan Fractions Treated to Date: 9
Plan Fractions Treated to Date: 9
Plan Prescribed Dose Per Fraction: 1.8 Gy
Plan Prescribed Dose Per Fraction: 1.8 Gy
Plan Total Fractions Prescribed: 28
Plan Total Fractions Prescribed: 28
Plan Total Prescribed Dose: 50.4 Gy
Plan Total Prescribed Dose: 50.4 Gy
Reference Point Dosage Given to Date: 16.2 Gy
Reference Point Dosage Given to Date: 16.2 Gy
Reference Point Session Dosage Given: 1.8 Gy
Reference Point Session Dosage Given: 1.8 Gy
Session Number: 9

## 2022-01-21 LAB — MICROALBUMIN / CREATININE URINE RATIO
Creatinine, Urine: 39 mg/dL (ref 20–275)
Microalb Creat Ratio: 13 mcg/mg creat (ref <30)
Microalb, Ur: 0.5 mg/dL

## 2022-01-21 LAB — URINE CULTURE
MICRO NUMBER:: 14087949
Result:: NO GROWTH
SPECIMEN QUALITY:: ADEQUATE

## 2022-01-21 NOTE — Telephone Encounter (Signed)
   Telephone encounter was:  Successful.  01/21/2022 Name: Sharon Woods MRN: [id number] DOB: [birth year]  Sharon Woods is a 61 y.o. year old female who is a primary care patient of Teodora Medici, DO . The community resource team was consulted for assistance with Transportation Needs   Care guide performed the following interventions: Spoke to patient about [facility]/[employer] [phone]. Verified email address sent information for [employer]. Patient stated that she currently rides the bus but wanted a backup transportation option. Letter saved in Epic.  Follow Up Plan:  No further follow up planned at this time. The patient has been provided with needed resources.  [place] Resource Care Guide   ??[email or web address]  ?? [phone]   Website: [email or web address]  [email or web address]

## 2022-01-21 NOTE — Progress Notes (Signed)
Spoke with patient in radiation today.  She made a comment about not having any heat right now and the bill is $600.   I gave her a financial assistance application to fill out and return.  Otherwise she is doing well with radiation with some mild side effects.

## 2022-01-22 ENCOUNTER — Encounter: Payer: Self-pay | Admitting: Physical Therapy

## 2022-01-22 ENCOUNTER — Ambulatory Visit: Payer: Medicaid Other | Admitting: Physical Therapy

## 2022-01-22 ENCOUNTER — Other Ambulatory Visit: Payer: Self-pay

## 2022-01-22 ENCOUNTER — Ambulatory Visit
Admission: RE | Admit: 2022-01-22 | Discharge: 2022-01-22 | Disposition: A | Discharge status: Home / Self Care | Payer: Medicaid Other | Source: Ambulatory Visit | Attending: Radiation Oncology | Admitting: Radiation Oncology

## 2022-01-22 DIAGNOSIS — M25612 Stiffness of left shoulder, not elsewhere classified: Secondary | ICD-10-CM

## 2022-01-22 DIAGNOSIS — C50412 Malignant neoplasm of upper-outer quadrant of left female breast: Secondary | ICD-10-CM | POA: Diagnosis not present

## 2022-01-22 DIAGNOSIS — Z170 Estrogen receptor positive status [ER+]: Secondary | ICD-10-CM | POA: Diagnosis not present

## 2022-01-22 DIAGNOSIS — M25611 Stiffness of right shoulder, not elsewhere classified: Secondary | ICD-10-CM | POA: Diagnosis not present

## 2022-01-22 DIAGNOSIS — C50511 Malignant neoplasm of lower-outer quadrant of right female breast: Secondary | ICD-10-CM | POA: Diagnosis not present

## 2022-01-22 DIAGNOSIS — Z510 Encounter for antineoplastic radiation therapy: Secondary | ICD-10-CM | POA: Diagnosis not present

## 2022-01-22 DIAGNOSIS — M546 Pain in thoracic spine: Secondary | ICD-10-CM

## 2022-01-22 LAB — RAD ONC ARIA SESSION SUMMARY
Course Elapsed Days: 13
Plan Fractions Treated to Date: 10
Plan Fractions Treated to Date: 10
Plan Prescribed Dose Per Fraction: 1.8 Gy
Plan Prescribed Dose Per Fraction: 1.8 Gy
Plan Total Fractions Prescribed: 28
Plan Total Fractions Prescribed: 28
Plan Total Prescribed Dose: 50.4 Gy
Plan Total Prescribed Dose: 50.4 Gy
Reference Point Dosage Given to Date: 18 Gy
Reference Point Dosage Given to Date: 18 Gy
Reference Point Session Dosage Given: 1.8 Gy
Reference Point Session Dosage Given: 1.8 Gy
Session Number: 10

## 2022-01-22 NOTE — Therapy (Signed)
OUTPATIENT PHYSICAL THERAPY SHOULDER TREATMENT   Patient Name: Sharon Woods MRN: [id number] DOB:October 14, 1960, 61 y.o., female Today's Date: 01/23/2022   PT End of Session - 01/22/22 0930     Visit Number 6    Number of Visits 17    Date for PT Re-Evaluation 02/21/22    Authorization Type [place] Medicaid Healthy Blue    Authorization Time Period Authorizaed for 12/23/21-03/23/22 for 13 viists; Certification for 2/58/52-77/8/24;    Authorization - Visit Number 6    Authorization - Number of Visits 13    Progress Note Due on Visit 10    PT Start Time 0930    PT Stop Time 1009    PT Time Calculation (min) 39 min    Activity Tolerance Patient tolerated treatment well;No increased pain    Behavior During Therapy WFL for tasks assessed/performed                Past Medical History:  Diagnosis Date   Anxiety    Cancer ([place])    Depression    Diabetes mellitus without complication ([place])    History of high cholesterol 2000   Hyperlipidemia    Hypertension    Patient denies medical problems    Thrombocytopenia ([place])    Past Surgical History:  Procedure Laterality Date   ABDOMINAL HYSTERECTOMY     AXILLARY SENTINEL NODE BIOPSY Left 11/15/2021   Procedure: AXILLARY SENTINEL NODE BIOPSY;  Surgeon: [name], MD;  Location: [facility] ORS;  Service: General;  Laterality: Left;   BREAST BIOPSY Right 08/21/2021   [place] bx 7:00 mass coil clip path pending   BREAST BIOPSY Right 08/21/2021   [place] bx of LN, hydro marker, path pending   BREAST BIOPSY Left 08/21/2021   [place] bx, heart marker, path pending   CESAREAN SECTION     x2   MASTECTOMY MODIFIED RADICAL Right 11/15/2021   Procedure: MASTECTOMY MODIFIED RADICAL;  Surgeon: [name], MD;  Location: [facility] ORS;  Service: General;  Laterality: Right;   NO PAST SURGERIES     TOTAL MASTECTOMY Left 11/15/2021   Procedure: TOTAL MASTECTOMY;  Surgeon: [name], MD;  Location: [facility] ORS;  Service: General;  Laterality: Left;    Patient Active Problem List   Diagnosis Date Noted   Abnormal gastrointestinal PET scan 01/10/2022   Diabetes mellitus ([place]) 12/05/2021   Status post bilateral mastectomy 11/21/2021   Bilateral breast cancer ([place]) 11/15/2021   Genetic testing 10/07/2021   Vaginitis 09/28/2021   Thrombocytopenia ([place]) 09/08/2021   Malignant neoplasm of left breast ([place]) 08/28/2021   Malignant neoplasm of lower-outer quadrant of right breast of female, estrogen receptor positive ([place]) 08/28/2021   Goals of care, counseling/discussion 08/28/2021   Severe recurrent major depression with psychotic features ([place]) 11/15/2015   Hypertension 11/15/2015   Noncompliance 11/15/2015   Severe major depression, single episode, with psychotic features, mood-congruent ([place]) 03/29/2015   Protein-calorie malnutrition, severe 03/26/2015   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    PCP: No PCP  REFERRING PROVIDER: [name] MD  REFERRING DIAG: Decreased bilat shoulder ROM post double masectomy  THERAPY DIAG:  Stiffness of right shoulder, not elsewhere classified  Stiffness of left shoulder, not elsewhere classified  Pain in thoracic spine  Rationale for Evaluation and Treatment Rehabilitation  ONSET DATE: August 18th 2023  SUBJECTIVE:  SUBJECTIVE STATEMENT: Pt reports having tight chest musculature hard to lift hands over head. Pt complains of rash from radiation, has trouble staying still during radiation treatment. No pain reported in shoulders, just in region of radiation. Reports 2nd week of radiation treatment. Pt reports being tired sometimes does as much HEP as she can. Pt has radiation appointment immediately following therapy session.  PERTINENT HISTORY: Pt is a 61 year old female presenting with bilat decreased shoulder ROM following  double masectomy 11/15/21. Not currently under any cancer treatments, is seeking further treatments. No lymphedema, or treatment for this at this time. Reports since surgery she has decreased overhead motion. She has bilat pain at the axilla of her Ues. Pain currently 5/10; lowest pain score 3/10; worst pain 9/10. The pain she feels is sharp and she also has numbness/tingling in the axilla across the top of bilat breasts. Pain is aggravated by reaching overhead, reaching behind her back, pulling, pushing... really anything I do with my arms. Laying on her back hurts as well. Pain is relieved by tylenol, pain medication , and ice. 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 Patient does not have a license or vehicle, relies on family and public transit. Pt denies N/V, B&B changes, unexplained weight fluctuation, saddle paresthesia, fever, night sweats, or unrelenting night pain at this time.  Pt poor historian, seems to not have understanding of current condition.  PMH gathered from chart review: 11/05/16 L simple mastectomy and R modified mastectomy following stage 2 breast cancer diagnosis with lymphnode involvement; recent cellulitis from drainage with course of antibiotics.   PAIN:  Are you having pain? Yes: NPRS scale: 4/10 Pain location: bilat axilla Pain description: tingling, sharp Aggravating factors: reaching overhead, reaching behind her back, pulling, pushing... really anything I do with my arms. Laying on her back hurts as well.  Relieving factors: tylenol, pain medication , and ice  PRECAUTIONS: None   OBJECTIVE:   UPPER EXTREMITY ROM:   Active /PROM at eval Right eval Left eval  Shoulder flexion 104/146 106/144  Shoulder extension WNL WNL  Shoulder abduction 92/106 119/135  Shoulder internal rotation (apleys) T12/WNL T12/WNL  Shoulder external rotation (apleys) C5/WNL CTJ/WNL  Elbow flexion WNL WNL   Elbow extension WNL WNL  (Blank rows = not tested)      TODAY'S TREATMENT: There.ex:  Nu Step L3 for 5 minutes seat 6 UE 12 for gentle protraction <> retraction strengthening/movement Use of towel roll along thoracic spine to improve trunk rotation.   Seated thoracic extension over half bolster with BUE flex with dowel AAROM, pt reports tight chest, 12 reps Red theraball roll up the wall with BUE liftoffs at the top 8 reps Overhead wt'd ball bounce on wall 2x 12 bilat with good eventual carry over of demo  Prone supermans 2 sets, 8 reps with good carry over following initial cuing for there Prone T's 2 sets, 10 reps Y on wall 2# DB 2x 12 with good carry over of scapulohumeral rhythm; more difficulty with LUE, pain noted in axilla region on third rep Squat with bilat 2# DB overhead press 2x 6 with concordant demo for technique; more difficulty with LUE raise      PATIENT EDUCATION: Education details: Patient was educated on diagnosis, anatomy and pathology involved, prognosis, role of PT, and was given an HEP, demonstrating exercise with proper form following verbal and tactile cues, and was given a  paper hand out to continue exercise at home. Pt was educated on and agreed to plan of care.  Person educated: Patient Education method: Explanation, Demonstration, and Handouts Education comprehension: verbalized understanding, returned demonstration, and tactile cues required   HOME EXERCISE PROGRAM: Access Code: [email or web address] URL: [email or web address] Date: 12/25/2021 Prepared by: Rebbeca Paul  Exercises - Seated Thoracic Lumbar Extension with Pectoralis Stretch  - 3 x daily - 7 x weekly - 12 reps - 2sec hold - Seated Shoulder Abduction Towel Slide at Table Top  - 3 x daily - 7 x weekly - 1 sets - 3 reps - 30 hold - Seated Shoulder Abduction AAROM with Pulley Behind  - 3 x daily - 7 x weekly - 12 reps - 2sec hold - Seated Shoulder Flexion AAROM with Pulley Behind  - 3  x daily - 7 x weekly - 12 reps - 2sec hold  ASSESSMENT:  CLINICAL IMPRESSION: PT continued therapeutic exercises focusing on thoracic spine and shoulder mobility. Patient required multiple breaks during UE overhead activities due to painful/itching feeling down the right arm. Patients left arm more challenged during overhead exercises. Patient able to comply with prone exercises with no increased discomfort. Pt will continue to benefit from skilled PT for continued progression of therapeutic exercises for pain free AROM of bilateral shoulders, thoracic and cervical spine.   OBJECTIVE IMPAIRMENTS decreased activity tolerance, decreased coordination, decreased endurance, decreased mobility, decreased ROM, decreased strength, increased muscle spasms, impaired flexibility, impaired tone, impaired UE functional use, improper body mechanics, postural dysfunction, and pain.   ACTIVITY LIMITATIONS carrying, lifting, sleeping, transfers, bathing, dressing, reach over head, and hygiene/grooming  PARTICIPATION LIMITATIONS: meal prep, cleaning, laundry, shopping, community activity, and occupation  PERSONAL FACTORS Age, Education, Fitness, Past/current experiences, Sex, Social background, Time since onset of injury/illness/exacerbation, Transportation, and 3+ comorbidities: A&D, DB, HTN, history of cancer  are also affecting patient's functional outcome.   REHAB POTENTIAL: Good  CLINICAL DECISION MAKING: Evolving/moderate complexity  EVALUATION COMPLEXITY: Moderate   GOALS: Goals reviewed with patient? Yes  SHORT TERM GOALS: Target date: 02/20/2022  (Remove Blue Hyperlink)  Pt will be independent with HEP in order to improve strength and balance in order to decrease fall risk and improve function at home and work. Baseline: 12/18/21 HEP given  Goal status: INITIAL    LONG TERM GOALS: Target date: 03/20/2022  (Remove Blue Hyperlink)  Pt will decrease worst pain as reported on NPRS by at least 3  points in order to demonstrate clinically significant reduction in pain.  Baseline: 12/21/21 9/10 Goal status: INITIAL  2.  Patient will increase FOTO score to 61 to demonstrate predicted increase in functional mobility to complete ADLs  Baseline: 12/21/21 40 Goal status: INITIAL  3.  Pt will demonstrate 4+/5 gross periscapular MMT in order to complete heavy household ADLs Baseline: 12/21/21 Y lower trap 3+/4- ; T Scapular retractors 4-/4 Goal status: INITIAL  4.  Pt will demonstrate full shoulder ROM in order to complete household and self care ADLs Baseline: R/L flex 104/106 abd: 92/119 ER: C5/WNL Goal status: INITIAL    PLAN: PT FREQUENCY: 2x/week  PT DURATION: 8 weeks  PLANNED INTERVENTIONS: Therapeutic exercises, Therapeutic activity, Neuromuscular re-education, Balance training, Gait training, Patient/Family education, Self Care, Joint mobilization, Joint manipulation, DME instructions, Aquatic Therapy, Dry Needling, Electrical stimulation, Spinal manipulation, Spinal mobilization, Cryotherapy, Moist heat, Traction, Ultrasound, Manual therapy, and Re-evaluation  PLAN FOR NEXT SESSION: progress shoulder mobility and stregnthening.  Edison Nasuti A. Laurance Flatten, SPT     Durwin Reges  DPT Physical Therapist- [facility]  01/23/2022, 11:43 AM

## 2022-01-23 ENCOUNTER — Telehealth: Payer: Self-pay | Admitting: *Deleted

## 2022-01-23 ENCOUNTER — Ambulatory Visit
Admission: RE | Admit: 2022-01-23 | Discharge: 2022-01-23 | Disposition: A | Discharge status: Home / Self Care | Payer: Medicaid Other | Source: Ambulatory Visit | Attending: Radiation Oncology | Admitting: Radiation Oncology

## 2022-01-23 ENCOUNTER — Other Ambulatory Visit: Payer: Self-pay

## 2022-01-23 ENCOUNTER — Inpatient Hospital Stay: Payer: Medicaid Other

## 2022-01-23 DIAGNOSIS — C50511 Malignant neoplasm of lower-outer quadrant of right female breast: Secondary | ICD-10-CM | POA: Diagnosis not present

## 2022-01-23 DIAGNOSIS — Z170 Estrogen receptor positive status [ER+]: Secondary | ICD-10-CM | POA: Diagnosis not present

## 2022-01-23 DIAGNOSIS — Z510 Encounter for antineoplastic radiation therapy: Secondary | ICD-10-CM | POA: Diagnosis not present

## 2022-01-23 DIAGNOSIS — C50412 Malignant neoplasm of upper-outer quadrant of left female breast: Secondary | ICD-10-CM | POA: Diagnosis not present

## 2022-01-23 LAB — RAD ONC ARIA SESSION SUMMARY
Course Elapsed Days: 14
Plan Fractions Treated to Date: 11
Plan Fractions Treated to Date: 11
Plan Prescribed Dose Per Fraction: 1.8 Gy
Plan Prescribed Dose Per Fraction: 1.8 Gy
Plan Total Fractions Prescribed: 28
Plan Total Fractions Prescribed: 28
Plan Total Prescribed Dose: 50.4 Gy
Plan Total Prescribed Dose: 50.4 Gy
Reference Point Dosage Given to Date: 19.8 Gy
Reference Point Dosage Given to Date: 19.8 Gy
Reference Point Session Dosage Given: 1.8 Gy
Reference Point Session Dosage Given: 1.8 Gy
Session Number: 11

## 2022-01-23 LAB — CBC
HCT: 37.3 % (ref 36.0–46.0)
Hemoglobin: 11.9 g/dL — ABNORMAL LOW (ref 12.0–15.0)
MCH: 29.4 pg (ref 26.0–34.0)
MCHC: 31.9 g/dL (ref 30.0–36.0)
MCV: 92.1 fL (ref 80.0–100.0)
Platelets: 63 10*3/uL — ABNORMAL LOW (ref 150–400)
RBC: 4.05 MIL/uL (ref 3.87–5.11)
RDW: 13.7 % (ref 11.5–15.5)
WBC: 2.4 10*3/uL — ABNORMAL LOW (ref 4.0–10.5)
nRBC: 0 % (ref 0.0–0.2)

## 2022-01-23 NOTE — Telephone Encounter (Signed)
Called patient to advise her the due to her low platelet count she was going to be on break from radiation treatments until 01/30/22  per Dr. Baruch Gouty. On 11/2 she will have labs and treatment will be determined by the results. Verified information with husband.

## 2022-01-24 ENCOUNTER — Ambulatory Visit: Payer: Medicaid Other

## 2022-01-27 ENCOUNTER — Ambulatory Visit: Payer: Medicaid Other

## 2022-01-27 ENCOUNTER — Other Ambulatory Visit: Payer: Self-pay | Admitting: *Deleted

## 2022-01-28 ENCOUNTER — Ambulatory Visit: Payer: Medicaid Other

## 2022-01-29 ENCOUNTER — Ambulatory Visit: Payer: Medicaid Other

## 2022-01-29 ENCOUNTER — Ambulatory Visit: Payer: Medicaid Other | Attending: Surgery | Admitting: Physical Therapy

## 2022-01-29 ENCOUNTER — Encounter: Payer: Self-pay | Admitting: Physical Therapy

## 2022-01-29 DIAGNOSIS — M25611 Stiffness of right shoulder, not elsewhere classified: Secondary | ICD-10-CM | POA: Diagnosis not present

## 2022-01-29 DIAGNOSIS — M546 Pain in thoracic spine: Secondary | ICD-10-CM | POA: Insufficient documentation

## 2022-01-29 DIAGNOSIS — M25612 Stiffness of left shoulder, not elsewhere classified: Secondary | ICD-10-CM | POA: Diagnosis present

## 2022-01-29 NOTE — Therapy (Unsigned)
OUTPATIENT PHYSICAL THERAPY SHOULDER TREATMENT   Patient Name: Sharon Woods MRN: [id number] DOB:1960/09/02, 61 y.o., female Today's Date: 01/30/2022   PT End of Session - 01/29/22 1003     Visit Number 7    Number of Visits 17    Date for PT Re-Evaluation 02/21/22    Authorization Type  Medicaid Healthy Blue    Authorization Time Period Authorizaed for 12/23/21-03/23/22 for 13 viists; Certification for 12/15/89-50/5/69;    Authorization - Visit Number 7    Authorization - Number of Visits 13    Progress Note Due on Visit 10    PT Start Time 1001    PT Stop Time 1040    PT Time Calculation (min) 39 min    Activity Tolerance Patient tolerated treatment well;No increased pain    Behavior During Therapy WFL for tasks assessed/performed                 Past Medical History:  Diagnosis Date   Anxiety    Cancer ([place])    Depression    Diabetes mellitus without complication ([place])    History of high cholesterol 2000   Hyperlipidemia    Hypertension    Patient denies medical problems    Thrombocytopenia ([place])    Past Surgical History:  Procedure Laterality Date   ABDOMINAL HYSTERECTOMY     AXILLARY SENTINEL NODE BIOPSY Left 11/15/2021   Procedure: AXILLARY SENTINEL NODE BIOPSY;  Surgeon: [name], MD;  Location: [facility] ORS;  Service: General;  Laterality: Left;   BREAST BIOPSY Right 08/21/2021   [place] bx 7:00 mass coil clip path pending   BREAST BIOPSY Right 08/21/2021   [place] bx of LN, hydro marker, path pending   BREAST BIOPSY Left 08/21/2021   [place] bx, heart marker, path pending   CESAREAN SECTION     x2   MASTECTOMY MODIFIED RADICAL Right 11/15/2021   Procedure: MASTECTOMY MODIFIED RADICAL;  Surgeon: [name], MD;  Location: [facility];  Service: General;  Laterality: Right;   NO PAST SURGERIES     TOTAL MASTECTOMY Left 11/15/2021   Procedure: TOTAL MASTECTOMY;  Surgeon: [name], MD;  Location: [facility] ORS;  Service: General;  Laterality: Left;    Patient Active Problem List   Diagnosis Date Noted   Abnormal gastrointestinal PET scan 01/10/2022   Diabetes mellitus ([place]) 12/05/2021   Status post bilateral mastectomy 11/21/2021   Bilateral breast cancer ([place]) 11/15/2021   Genetic testing 10/07/2021   Vaginitis 09/28/2021   Thrombocytopenia ([place]) 09/08/2021   Malignant neoplasm of left breast ([place]) 08/28/2021   Malignant neoplasm of lower-outer quadrant of right breast of female, estrogen receptor positive ([place]) 08/28/2021   Goals of care, counseling/discussion 08/28/2021   Severe recurrent major depression with psychotic features ([place]) 11/15/2015   Hypertension 11/15/2015   Noncompliance 11/15/2015   Severe major depression, single episode, with psychotic features, mood-congruent ([place]) 03/29/2015   Protein-calorie malnutrition, severe 03/26/2015   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    PCP: No PCP  REFERRING PROVIDER: [name] MD  REFERRING DIAG: Decreased bilat shoulder ROM post double masectomy  THERAPY DIAG:  Stiffness of right shoulder, not elsewhere classified  Stiffness of left shoulder, not elsewhere classified  Pain in thoracic spine  Rationale for Evaluation and Treatment Rehabilitation  ONSET DATE: August 18th 2023  SUBJECTIVE:  SUBJECTIVE STATEMENT: Pt reports having tight chest musculature hard to lift hands over head. Pt complains of rash from radiation, has trouble staying still during radiation treatment. No pain reported in shoulders, just in region of radiation. Reports 2nd week of radiation treatment. Pt reports being tired sometimes does as much HEP as she can. Pt has radiation appointment immediately following therapy session. Pt reports pain 6/10 in knees, no thoracic pain. Will not return to radiation until tomorrow. Reports  being on mediaction for depression. Pt reports not completing HEP.  PERTINENT HISTORY: Pt is a 61 year old female presenting with bilat decreased shoulder ROM following double masectomy 11/15/21. Not currently under any cancer treatments, is seeking further treatments. No lymphedema, or treatment for this at this time. Reports since surgery she has decreased overhead motion. She has bilat pain at the axilla of her Ues. Pain currently 5/10; lowest pain score 3/10; worst pain 9/10. The pain she feels is sharp and she also has numbness/tingling in the axilla across the top of bilat breasts. Pain is aggravated by reaching overhead, reaching behind her back, pulling, pushing... really anything I do with my arms. Laying on her back hurts as well. Pain is relieved by tylenol, pain medication , and ice. 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 Patient does not have a license or vehicle, relies on family and public transit. Pt denies N/V, B&B changes, unexplained weight fluctuation, saddle paresthesia, fever, night sweats, or unrelenting night pain at this time.  Pt poor historian, seems to not have understanding of current condition.  PMH gathered from chart review: 11/05/16 L simple mastectomy and R modified mastectomy following stage 2 breast cancer diagnosis with lymphnode involvement; recent cellulitis from drainage with course of antibiotics.   PAIN:  Are you having pain? Yes: NPRS scale: 4/10 Pain location: bilat axilla Pain description: tingling, sharp Aggravating factors: reaching overhead, reaching behind her back, pulling, pushing... really anything I do with my arms. Laying on her back hurts as well.  Relieving factors: tylenol, pain medication , and ice  PRECAUTIONS: None   OBJECTIVE:   UPPER EXTREMITY ROM:   Active /PROM at eval Right eval Left eval  Shoulder flexion 104/146 106/144  Shoulder extension WNL WNL   Shoulder abduction 92/106 119/135  Shoulder internal rotation (apleys) T12/WNL T12/WNL  Shoulder external rotation (apleys) C5/WNL CTJ/WNL  Elbow flexion WNL WNL  Elbow extension WNL WNL  (Blank rows = not tested)      TODAY'S TREATMENT: There.ex:  Nu Step L3 for 5 minutes seat 8 UE 10 for gentle protraction <> retraction strengthening/movement Use of towel roll along thoracic spine to improve trunk rotation.  Seated thoracic extension BUE flex with dowel AAROM, pt reports tight chest, 10 reps; pt cued for scapular retraction and stabilization. Standing BTB low rows 2 sets of 10 pt requires cueing for scapular control and appropriate distacne standing away for proper resistance Standing BTB rows with scapular retraction 2 sets of 10. Pt requires tactile cues for scapular positioning and proper movement in the elbows Overhead wt'd 2kg ball bounce on wall 2x 25 bilat with good eventual carry over of demo  Prone supermans 2 sets, 10 reps with good carry over following initial cuing for there Prone T's 2 sets, 10 reps  Squat with bilat 4# DB overhead press 2x 8 with concordant demo for technique; more difficulty  with LUE raise  PATIENT EDUCATION: Education details: Patient was educated on diagnosis, anatomy and pathology involved, prognosis, role of PT, and was given an HEP, demonstrating exercise with proper form following verbal and tactile cues, and was given a paper hand out to continue exercise at home. Pt was educated on and agreed to plan of care.  Person educated: Patient Education method: Explanation, Demonstration, and Handouts Education comprehension: verbalized understanding, returned demonstration, and tactile cues required   HOME EXERCISE PROGRAM: Access Code: [email or web address] URL: [email or web address] Date: 12/25/2021 Prepared by: Rebbeca Paul  Exercises - Seated Thoracic Lumbar Extension with Pectoralis Stretch  - 3 x daily - 7 x weekly - 12 reps -  2sec hold - Seated Shoulder Abduction Towel Slide at Table Top  - 3 x daily - 7 x weekly - 1 sets - 3 reps - 30 hold - Seated Shoulder Abduction AAROM with Pulley Behind  - 3 x daily - 7 x weekly - 12 reps - 2sec hold - Seated Shoulder Flexion AAROM with Pulley Behind  - 3 x daily - 7 x weekly - 12 reps - 2sec hold  ASSESSMENT:  CLINICAL IMPRESSION: PT progressed therapeutic exercises focusing on thoracic spine and shoulder strengthening and pain free ROM. Pt required a couple breaks during the weighted ball bounces on the wall exercises only during the LUE trials. Pt able to recall exercises from previous sessions with good carry over. Pt also able to comply with progressed strengthening exercises utilizing theraband for resistance with verbal and tactile cueing. Patient will continue to benefit from skilled physical therapy to improve pain free movement of thoracic spine and improve scapulothoracic and scapulohumeral rhythm.   OBJECTIVE IMPAIRMENTS decreased activity tolerance, decreased coordination, decreased endurance, decreased mobility, decreased ROM, decreased strength, increased muscle spasms, impaired flexibility, impaired tone, impaired UE functional use, improper body mechanics, postural dysfunction, and pain.   ACTIVITY LIMITATIONS carrying, lifting, sleeping, transfers, bathing, dressing, reach over head, and hygiene/grooming  PARTICIPATION LIMITATIONS: meal prep, cleaning, laundry, shopping, community activity, and occupation  PERSONAL FACTORS Age, Education, Fitness, Past/current experiences, Sex, Social background, Time since onset of injury/illness/exacerbation, Transportation, and 3+ comorbidities: A&D, DB, HTN, history of cancer  are also affecting patient's functional outcome.   REHAB POTENTIAL: Good  CLINICAL DECISION MAKING: Evolving/moderate complexity  EVALUATION COMPLEXITY: Moderate   GOALS: Goals reviewed with patient? Yes  SHORT TERM GOALS: Target date:  02/27/2022  (Remove Blue Hyperlink)  Pt will be independent with HEP in order to improve strength and balance in order to decrease fall risk and improve function at home and work. Baseline: 12/18/21 HEP given  Goal status: INITIAL    LONG TERM GOALS: Target date: 03/27/2022  (Remove Blue Hyperlink)  Pt will decrease worst pain as reported on NPRS by at least 3 points in order to demonstrate clinically significant reduction in pain.  Baseline: 12/21/21 9/10 Goal status: INITIAL  2.  Patient will increase FOTO score to 61 to demonstrate predicted increase in functional mobility to complete ADLs  Baseline: 12/21/21 40 Goal status: INITIAL  3.  Pt will demonstrate 4+/5 gross periscapular MMT in order to complete heavy household ADLs Baseline: 12/21/21 Y lower trap 3+/4- ; T Scapular retractors 4-/4 Goal status: INITIAL  4.  Pt will demonstrate full shoulder ROM in order to complete household and self care ADLs Baseline: R/L flex 104/106 abd: 92/119 ER: C5/WNL Goal status: INITIAL    PLAN: PT FREQUENCY: 2x/week  PT DURATION: 8 weeks  PLANNED INTERVENTIONS: Therapeutic exercises, Therapeutic activity, Neuromuscular re-education, Balance training,  Gait training, Patient/Family education, Self Care, Joint mobilization, Joint manipulation, DME instructions, Aquatic Therapy, Dry Needling, Electrical stimulation, Spinal manipulation, Spinal mobilization, Cryotherapy, Moist heat, Traction, Ultrasound, Manual therapy, and Re-evaluation  PLAN FOR NEXT SESSION: progress shoulder mobility and stregnthening.  Edison Nasuti A. Laurance Flatten, [place] DPT Physical Therapist- [facility]  01/30/2022, 10:33 AM

## 2022-01-30 ENCOUNTER — Ambulatory Visit: Payer: Medicaid Other

## 2022-01-30 ENCOUNTER — Ambulatory Visit: Admission: RE | Admit: 2022-01-30 | Payer: Medicaid Other | Source: Ambulatory Visit

## 2022-01-30 ENCOUNTER — Inpatient Hospital Stay: Payer: Medicaid Other

## 2022-01-30 DIAGNOSIS — C50412 Malignant neoplasm of upper-outer quadrant of left female breast: Secondary | ICD-10-CM | POA: Diagnosis present

## 2022-01-30 DIAGNOSIS — Z170 Estrogen receptor positive status [ER+]: Secondary | ICD-10-CM | POA: Insufficient documentation

## 2022-01-30 DIAGNOSIS — D696 Thrombocytopenia, unspecified: Secondary | ICD-10-CM | POA: Insufficient documentation

## 2022-01-30 DIAGNOSIS — F323 Major depressive disorder, single episode, severe with psychotic features: Secondary | ICD-10-CM | POA: Insufficient documentation

## 2022-01-30 DIAGNOSIS — C50511 Malignant neoplasm of lower-outer quadrant of right female breast: Secondary | ICD-10-CM | POA: Insufficient documentation

## 2022-01-30 DIAGNOSIS — Z801 Family history of malignant neoplasm of trachea, bronchus and lung: Secondary | ICD-10-CM | POA: Insufficient documentation

## 2022-01-30 DIAGNOSIS — Z87891 Personal history of nicotine dependence: Secondary | ICD-10-CM | POA: Insufficient documentation

## 2022-01-30 DIAGNOSIS — Z809 Family history of malignant neoplasm, unspecified: Secondary | ICD-10-CM | POA: Insufficient documentation

## 2022-01-30 DIAGNOSIS — C50812 Malignant neoplasm of overlapping sites of left female breast: Secondary | ICD-10-CM | POA: Insufficient documentation

## 2022-01-30 DIAGNOSIS — Z510 Encounter for antineoplastic radiation therapy: Secondary | ICD-10-CM | POA: Diagnosis present

## 2022-01-30 DIAGNOSIS — Z9013 Acquired absence of bilateral breasts and nipples: Secondary | ICD-10-CM | POA: Insufficient documentation

## 2022-01-30 DIAGNOSIS — F322 Major depressive disorder, single episode, severe without psychotic features: Secondary | ICD-10-CM | POA: Diagnosis not present

## 2022-01-30 DIAGNOSIS — E119 Type 2 diabetes mellitus without complications: Secondary | ICD-10-CM | POA: Diagnosis not present

## 2022-01-30 DIAGNOSIS — C50011 Malignant neoplasm of nipple and areola, right female breast: Secondary | ICD-10-CM

## 2022-01-30 LAB — CBC
HCT: 41.3 % (ref 36.0–46.0)
Hemoglobin: 13.4 g/dL (ref 12.0–15.0)
MCH: 29.3 pg (ref 26.0–34.0)
MCHC: 32.4 g/dL (ref 30.0–36.0)
MCV: 90.4 fL (ref 80.0–100.0)
Platelets: 67 10*3/uL — ABNORMAL LOW (ref 150–400)
RBC: 4.57 MIL/uL (ref 3.87–5.11)
RDW: 13.6 % (ref 11.5–15.5)
WBC: 3.4 10*3/uL — ABNORMAL LOW (ref 4.0–10.5)
nRBC: 0 % (ref 0.0–0.2)

## 2022-01-31 ENCOUNTER — Ambulatory Visit: Payer: Medicaid Other

## 2022-02-03 ENCOUNTER — Ambulatory Visit: Payer: Medicaid Other

## 2022-02-03 ENCOUNTER — Ambulatory Visit
Admission: RE | Admit: 2022-02-03 | Discharge: 2022-02-03 | Disposition: A | Discharge status: Home / Self Care | Payer: Medicaid Other | Source: Ambulatory Visit | Attending: Radiation Oncology | Admitting: Radiation Oncology

## 2022-02-03 ENCOUNTER — Other Ambulatory Visit: Payer: Self-pay

## 2022-02-03 DIAGNOSIS — Z170 Estrogen receptor positive status [ER+]: Secondary | ICD-10-CM | POA: Diagnosis not present

## 2022-02-03 DIAGNOSIS — F322 Major depressive disorder, single episode, severe without psychotic features: Secondary | ICD-10-CM | POA: Insufficient documentation

## 2022-02-03 DIAGNOSIS — C50511 Malignant neoplasm of lower-outer quadrant of right female breast: Secondary | ICD-10-CM | POA: Diagnosis not present

## 2022-02-03 DIAGNOSIS — Z87891 Personal history of nicotine dependence: Secondary | ICD-10-CM | POA: Insufficient documentation

## 2022-02-03 DIAGNOSIS — Z510 Encounter for antineoplastic radiation therapy: Secondary | ICD-10-CM | POA: Diagnosis not present

## 2022-02-03 DIAGNOSIS — Z801 Family history of malignant neoplasm of trachea, bronchus and lung: Secondary | ICD-10-CM | POA: Insufficient documentation

## 2022-02-03 DIAGNOSIS — C50412 Malignant neoplasm of upper-outer quadrant of left female breast: Secondary | ICD-10-CM | POA: Diagnosis not present

## 2022-02-03 DIAGNOSIS — Z809 Family history of malignant neoplasm, unspecified: Secondary | ICD-10-CM | POA: Insufficient documentation

## 2022-02-03 DIAGNOSIS — Z9013 Acquired absence of bilateral breasts and nipples: Secondary | ICD-10-CM | POA: Insufficient documentation

## 2022-02-03 DIAGNOSIS — D696 Thrombocytopenia, unspecified: Secondary | ICD-10-CM | POA: Insufficient documentation

## 2022-02-03 DIAGNOSIS — E119 Type 2 diabetes mellitus without complications: Secondary | ICD-10-CM | POA: Insufficient documentation

## 2022-02-03 LAB — RAD ONC ARIA SESSION SUMMARY
Course Elapsed Days: 25
Plan Fractions Treated to Date: 12
Plan Fractions Treated to Date: 12
Plan Prescribed Dose Per Fraction: 1.8 Gy
Plan Prescribed Dose Per Fraction: 1.8 Gy
Plan Total Fractions Prescribed: 28
Plan Total Fractions Prescribed: 28
Plan Total Prescribed Dose: 50.4 Gy
Plan Total Prescribed Dose: 50.4 Gy
Reference Point Dosage Given to Date: 21.6 Gy
Reference Point Dosage Given to Date: 21.6 Gy
Reference Point Session Dosage Given: 1.8 Gy
Reference Point Session Dosage Given: 1.8 Gy
Session Number: 12

## 2022-02-04 ENCOUNTER — Other Ambulatory Visit: Payer: Self-pay

## 2022-02-04 ENCOUNTER — Ambulatory Visit
Admission: RE | Admit: 2022-02-04 | Discharge: 2022-02-04 | Disposition: A | Discharge status: Home / Self Care | Payer: Medicaid Other | Source: Ambulatory Visit | Attending: Radiation Oncology | Admitting: Radiation Oncology

## 2022-02-04 ENCOUNTER — Ambulatory Visit: Payer: Medicaid Other

## 2022-02-04 DIAGNOSIS — C50511 Malignant neoplasm of lower-outer quadrant of right female breast: Secondary | ICD-10-CM | POA: Diagnosis not present

## 2022-02-04 DIAGNOSIS — Z510 Encounter for antineoplastic radiation therapy: Secondary | ICD-10-CM | POA: Diagnosis not present

## 2022-02-04 DIAGNOSIS — C50412 Malignant neoplasm of upper-outer quadrant of left female breast: Secondary | ICD-10-CM | POA: Diagnosis not present

## 2022-02-04 DIAGNOSIS — Z170 Estrogen receptor positive status [ER+]: Secondary | ICD-10-CM | POA: Diagnosis not present

## 2022-02-04 LAB — RAD ONC ARIA SESSION SUMMARY
Course Elapsed Days: 26
Plan Fractions Treated to Date: 13
Plan Fractions Treated to Date: 13
Plan Prescribed Dose Per Fraction: 1.8 Gy
Plan Prescribed Dose Per Fraction: 1.8 Gy
Plan Total Fractions Prescribed: 28
Plan Total Fractions Prescribed: 28
Plan Total Prescribed Dose: 50.4 Gy
Plan Total Prescribed Dose: 50.4 Gy
Reference Point Dosage Given to Date: 23.4 Gy
Reference Point Dosage Given to Date: 23.4 Gy
Reference Point Session Dosage Given: 1.8 Gy
Reference Point Session Dosage Given: 1.8 Gy
Session Number: 13

## 2022-02-05 ENCOUNTER — Encounter: Payer: Self-pay | Admitting: Physical Therapy

## 2022-02-05 ENCOUNTER — Other Ambulatory Visit: Payer: Self-pay

## 2022-02-05 ENCOUNTER — Ambulatory Visit: Payer: Medicaid Other

## 2022-02-05 ENCOUNTER — Ambulatory Visit
Admission: RE | Admit: 2022-02-05 | Discharge: 2022-02-05 | Disposition: A | Discharge status: Home / Self Care | Payer: Medicaid Other | Source: Ambulatory Visit | Attending: Radiation Oncology | Admitting: Radiation Oncology

## 2022-02-05 ENCOUNTER — Ambulatory Visit: Payer: Medicaid Other | Admitting: Physical Therapy

## 2022-02-05 DIAGNOSIS — Z170 Estrogen receptor positive status [ER+]: Secondary | ICD-10-CM | POA: Diagnosis not present

## 2022-02-05 DIAGNOSIS — C50511 Malignant neoplasm of lower-outer quadrant of right female breast: Secondary | ICD-10-CM | POA: Diagnosis not present

## 2022-02-05 DIAGNOSIS — M546 Pain in thoracic spine: Secondary | ICD-10-CM

## 2022-02-05 DIAGNOSIS — M25611 Stiffness of right shoulder, not elsewhere classified: Secondary | ICD-10-CM

## 2022-02-05 DIAGNOSIS — Z510 Encounter for antineoplastic radiation therapy: Secondary | ICD-10-CM | POA: Diagnosis not present

## 2022-02-05 DIAGNOSIS — C50412 Malignant neoplasm of upper-outer quadrant of left female breast: Secondary | ICD-10-CM | POA: Diagnosis not present

## 2022-02-05 DIAGNOSIS — M25612 Stiffness of left shoulder, not elsewhere classified: Secondary | ICD-10-CM

## 2022-02-05 LAB — RAD ONC ARIA SESSION SUMMARY
Course Elapsed Days: 27
Plan Fractions Treated to Date: 14
Plan Fractions Treated to Date: 14
Plan Prescribed Dose Per Fraction: 1.8 Gy
Plan Prescribed Dose Per Fraction: 1.8 Gy
Plan Total Fractions Prescribed: 28
Plan Total Fractions Prescribed: 28
Plan Total Prescribed Dose: 50.4 Gy
Plan Total Prescribed Dose: 50.4 Gy
Reference Point Dosage Given to Date: 25.2 Gy
Reference Point Dosage Given to Date: 25.2 Gy
Reference Point Session Dosage Given: 1.8 Gy
Reference Point Session Dosage Given: 1.8 Gy
Session Number: 14

## 2022-02-05 NOTE — Therapy (Signed)
OUTPATIENT PHYSICAL THERAPY SHOULDER TREATMENT   Patient Name: Sharon Woods MRN: [id number] DOB:Oct 06, 1960, 61 y.o., female Today's Date: 02/07/2022          Past Medical History:  Diagnosis Date   Anxiety    Cancer ([place])    Depression    Diabetes mellitus without complication ([place])    History of high cholesterol 2000   Hyperlipidemia    Hypertension    Patient denies medical problems    Thrombocytopenia ([place])    Past Surgical History:  Procedure Laterality Date   ABDOMINAL HYSTERECTOMY     AXILLARY SENTINEL NODE BIOPSY Left 11/15/2021   Procedure: AXILLARY SENTINEL NODE BIOPSY;  Surgeon: [name], MD;  Location: [facility] ORS;  Service: General;  Laterality: Left;   BREAST BIOPSY Right 08/21/2021   [place] bx 7:00 mass coil clip path pending   BREAST BIOPSY Right 08/21/2021   [place] bx of LN, hydro marker, path pending   BREAST BIOPSY Left 08/21/2021   [place] bx, heart marker, path pending   CESAREAN SECTION     x2   MASTECTOMY MODIFIED RADICAL Right 11/15/2021   Procedure: MASTECTOMY MODIFIED RADICAL;  Surgeon: [name], MD;  Location: [facility] ORS;  Service: General;  Laterality: Right;   NO PAST SURGERIES     TOTAL MASTECTOMY Left 11/15/2021   Procedure: TOTAL MASTECTOMY;  Surgeon: [name], MD;  Location: [facility] ORS;  Service: General;  Laterality: Left;   Patient Active Problem List   Diagnosis Date Noted   Abnormal gastrointestinal PET scan 01/10/2022   Diabetes mellitus ([place]) 12/05/2021   Status post bilateral mastectomy 11/21/2021   Bilateral breast cancer ([place]) 11/15/2021   Genetic testing 10/07/2021   Vaginitis 09/28/2021   Thrombocytopenia ([place]) 09/08/2021   Malignant neoplasm of left breast ([place]) 08/28/2021   Malignant neoplasm of lower-outer quadrant of right breast of female, estrogen receptor positive ([place]) 08/28/2021   Goals of care, counseling/discussion 08/28/2021   Severe recurrent major depression with psychotic features ([place]) 11/15/2015    Hypertension 11/15/2015   Noncompliance 11/15/2015   Severe major depression, single episode, with psychotic features, mood-congruent ([place]) 03/29/2015   Protein-calorie malnutrition, severe 03/26/2015   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    PCP: No PCP  REFERRING PROVIDER: [name] MD  REFERRING DIAG: Decreased bilat shoulder ROM post double masectomy  THERAPY DIAG:  Stiffness of right shoulder, not elsewhere classified  Stiffness of left shoulder, not elsewhere classified  Pain in thoracic spine  Rationale for Evaluation and Treatment Rehabilitation  ONSET DATE: August 18th 2023  SUBJECTIVE:  SUBJECTIVE STATEMENT: Pt reports she has "almost beaten cancer." Pt reports being itching from the radiation treatment. Pt reports having to go to hospital after treatment today for radiation appointment. Pt reports no pain in upper extremities or chest just itching sensation.  PERTINENT HISTORY: Pt is a 61 year old female presenting with bilat decreased shoulder ROM following double masectomy 11/15/21. Not currently under any cancer treatments, is seeking further treatments. No lymphedema, or treatment for this at this time. Reports since surgery she has decreased overhead motion. She has bilat pain at the axilla of her Ues. Pain currently 5/10; lowest pain score 3/10; worst pain 9/10. The pain she feels is sharp and she also has numbness/tingling in the axilla across the top of bilat breasts. Pain is aggravated by reaching overhead, reaching behind her back, pulling, pushing... really anything I do with my arms. Laying on her back hurts as well. Pain is relieved by tylenol, pain medication , and ice. 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  Patient does not have a license or vehicle, relies on family and public transit. Pt denies N/V, B&B changes, unexplained weight fluctuation, saddle paresthesia, fever, night sweats, or unrelenting night pain at this time.  Pt poor historian, seems to not have understanding of current condition.  PMH gathered from chart review: 11/05/16 L simple mastectomy and R modified mastectomy following stage 2 breast cancer diagnosis with lymphnode involvement; recent cellulitis from drainage with course of antibiotics.   PAIN:  Are you having pain? Yes: NPRS scale: 4/10 Pain location: bilat axilla Pain description: tingling, sharp Aggravating factors: reaching overhead, reaching behind her back, pulling, pushing... really anything I do with my arms. Laying on her back hurts as well.  Relieving factors: tylenol, pain medication , and ice  PRECAUTIONS: None   OBJECTIVE:   UPPER EXTREMITY ROM:   Active /PROM at eval Right eval Left eval  Shoulder flexion 104/146 106/144  Shoulder extension WNL WNL  Shoulder abduction 92/106 119/135  Shoulder internal rotation (apleys) T12/WNL T12/WNL  Shoulder external rotation (apleys) C5/WNL CTJ/WNL  Elbow flexion WNL WNL  Elbow extension WNL WNL  (Blank rows = not tested)      TODAY'S TREATMENT: There.ex:  Nu Step L2 for 5 minutes seat 7 UE 11 for gentle protraction <> retraction strengthening/movement Use of towel roll along thoracic spine to improve trunk rotation.  Seated in green chair thoracic extension BUE flex with dowel AAROM with half bolster, pt reports tight chest, 15 reps; pt cued for scapular retraction and stabilization. Standing BTB low rows 2 sets of 10 pt requires cueing for scapular control and appropriate distance standing away for proper resistance Standing BTB rows with scapular retraction 2 sets of 10. Pt requires tactile cues for scapular positioning and proper movement in the elbows Standing TRX pushups 2, 1 set of 10 Overhead wt'd  2kg ball bounce on wall 2x 30 bilat with good eventual carry over of demo  Prone supermans 2 sets, 10 reps with good carry over following initial cuing for there Prone T's 2 sets, 10 reps Quadruped alt UE and opposite LE liftoffs 1 set of 5 reps bilaterally  Squat with bilat 4# DB overhead press 1x 8 with concordant demo for technique; more difficulty  with LUE raise      PATIENT EDUCATION: Education details: Patient was educated on diagnosis, anatomy and pathology involved, prognosis, role of PT, and was given  an HEP, demonstrating exercise with proper form following verbal and tactile cues, and was given a paper hand out to continue exercise at home. Pt was educated on and agreed to plan of care.  Person educated: Patient Education method: Explanation, Demonstration, and Handouts Education comprehension: verbalized understanding, returned demonstration, and tactile cues required   HOME EXERCISE PROGRAM: Access Code: [email or web address] URL: [email or web address] Date: 12/25/2021 Prepared by: Rebbeca Paul  Exercises - Seated Thoracic Lumbar Extension with Pectoralis Stretch  - 3 x daily - 7 x weekly - 12 reps - 2sec hold - Seated Shoulder Abduction Towel Slide at Table Top  - 3 x daily - 7 x weekly - 1 sets - 3 reps - 30 hold - Seated Shoulder Abduction AAROM with Pulley Behind  - 3 x daily - 7 x weekly - 12 reps - 2sec hold - Seated Shoulder Flexion AAROM with Pulley Behind  - 3 x daily - 7 x weekly - 12 reps - 2sec hold  ASSESSMENT:  CLINICAL IMPRESSION: PT continued therex targeting thoracic spine and shoulder strengthening. Pt required no breaks with overhead activities today. Pt able to recall exercises from previous sessions with good demonstration and proper technique. Pt tolerated theraband exercises with success and self-corrected posture during movements. Pt also was able to initiate TRX pushup during todays treatment despite minimal discomfort in chest region. Pt  required no tactile cues and minimal verbal cues for proper positioning during treatment today. Patient will continue to benefit from skilled physical therapy to improve mobility of scapulohumeral joint and scapulothoracic articulation for return to PLOF.   OBJECTIVE IMPAIRMENTS decreased activity tolerance, decreased coordination, decreased endurance, decreased mobility, decreased ROM, decreased strength, increased muscle spasms, impaired flexibility, impaired tone, impaired UE functional use, improper body mechanics, postural dysfunction, and pain.   ACTIVITY LIMITATIONS carrying, lifting, sleeping, transfers, bathing, dressing, reach over head, and hygiene/grooming  PARTICIPATION LIMITATIONS: meal prep, cleaning, laundry, shopping, community activity, and occupation  PERSONAL FACTORS Age, Education, Fitness, Past/current experiences, Sex, Social background, Time since onset of injury/illness/exacerbation, Transportation, and 3+ comorbidities: A&D, DB, HTN, history of cancer  are also affecting patient's functional outcome.   REHAB POTENTIAL: Good  CLINICAL DECISION MAKING: Evolving/moderate complexity  EVALUATION COMPLEXITY: Moderate   GOALS: Goals reviewed with patient? Yes  SHORT TERM GOALS: Target date: 03/07/2022  (Remove Blue Hyperlink)  Pt will be independent with HEP in order to improve strength and balance in order to decrease fall risk and improve function at home and work. Baseline: 12/18/21 HEP given  Goal status: INITIAL    LONG TERM GOALS: Target date: 04/04/2022  (Remove Blue Hyperlink)  Pt will decrease worst pain as reported on NPRS by at least 3 points in order to demonstrate clinically significant reduction in pain.  Baseline: 12/21/21 9/10 Goal status: INITIAL  2.  Patient will increase FOTO score to 61 to demonstrate predicted increase in functional mobility to complete ADLs  Baseline: 12/21/21 40 Goal status: INITIAL  3.  Pt will demonstrate 4+/5 gross  periscapular MMT in order to complete heavy household ADLs Baseline: 12/21/21 Y lower trap 3+/4- ; T Scapular retractors 4-/4 Goal status: INITIAL  4.  Pt will demonstrate full shoulder ROM in order to complete household and self care ADLs Baseline: R/L flex 104/106 abd: 92/119 ER: C5/WNL Goal status: INITIAL    PLAN: PT FREQUENCY: 2x/week  PT DURATION: 8 weeks  PLANNED INTERVENTIONS: Therapeutic exercises, Therapeutic activity, Neuromuscular re-education, Balance training, Gait training, Patient/Family education, Self Care, Joint mobilization, Joint manipulation, DME  instructions, Aquatic Therapy, Dry Needling, Electrical stimulation, Spinal manipulation, Spinal mobilization, Cryotherapy, Moist heat, Traction, Ultrasound, Manual therapy, and Re-evaluation  PLAN FOR NEXT SESSION: progress shoulder mobility and stregnthening.  Edison Nasuti A. Laurance Flatten, [place] DPT Physical Therapist- [facility]  02/07/2022, 8:24 AM

## 2022-02-06 ENCOUNTER — Other Ambulatory Visit: Payer: Self-pay

## 2022-02-06 ENCOUNTER — Inpatient Hospital Stay: Payer: Medicaid Other

## 2022-02-06 ENCOUNTER — Ambulatory Visit
Admission: RE | Admit: 2022-02-06 | Discharge: 2022-02-06 | Disposition: A | Discharge status: Home / Self Care | Payer: Medicaid Other | Source: Ambulatory Visit | Attending: Radiation Oncology | Admitting: Radiation Oncology

## 2022-02-06 DIAGNOSIS — Z170 Estrogen receptor positive status [ER+]: Secondary | ICD-10-CM | POA: Diagnosis not present

## 2022-02-06 DIAGNOSIS — C50412 Malignant neoplasm of upper-outer quadrant of left female breast: Secondary | ICD-10-CM | POA: Diagnosis not present

## 2022-02-06 DIAGNOSIS — C50511 Malignant neoplasm of lower-outer quadrant of right female breast: Secondary | ICD-10-CM | POA: Diagnosis not present

## 2022-02-06 DIAGNOSIS — Z510 Encounter for antineoplastic radiation therapy: Secondary | ICD-10-CM | POA: Diagnosis not present

## 2022-02-06 LAB — RAD ONC ARIA SESSION SUMMARY
Course Elapsed Days: 28
Plan Fractions Treated to Date: 15
Plan Fractions Treated to Date: 15
Plan Prescribed Dose Per Fraction: 1.8 Gy
Plan Prescribed Dose Per Fraction: 1.8 Gy
Plan Total Fractions Prescribed: 28
Plan Total Fractions Prescribed: 28
Plan Total Prescribed Dose: 50.4 Gy
Plan Total Prescribed Dose: 50.4 Gy
Reference Point Dosage Given to Date: 27 Gy
Reference Point Dosage Given to Date: 27 Gy
Reference Point Session Dosage Given: 1.8 Gy
Reference Point Session Dosage Given: 1.8 Gy
Session Number: 15

## 2022-02-06 LAB — CBC
HCT: 39.3 % (ref 36.0–46.0)
Hemoglobin: 12.9 g/dL (ref 12.0–15.0)
MCH: 29.5 pg (ref 26.0–34.0)
MCHC: 32.8 g/dL (ref 30.0–36.0)
MCV: 89.9 fL (ref 80.0–100.0)
Platelets: 58 10*3/uL — ABNORMAL LOW (ref 150–400)
RBC: 4.37 MIL/uL (ref 3.87–5.11)
RDW: 13.6 % (ref 11.5–15.5)
WBC: 2.9 10*3/uL — ABNORMAL LOW (ref 4.0–10.5)
nRBC: 0 % (ref 0.0–0.2)

## 2022-02-07 ENCOUNTER — Other Ambulatory Visit: Payer: Self-pay

## 2022-02-07 ENCOUNTER — Encounter: Payer: Self-pay | Admitting: Physical Therapy

## 2022-02-07 ENCOUNTER — Ambulatory Visit
Admission: RE | Admit: 2022-02-07 | Discharge: 2022-02-07 | Disposition: A | Discharge status: Home / Self Care | Payer: Medicaid Other | Source: Ambulatory Visit | Attending: Radiation Oncology | Admitting: Radiation Oncology

## 2022-02-07 DIAGNOSIS — C50511 Malignant neoplasm of lower-outer quadrant of right female breast: Secondary | ICD-10-CM | POA: Diagnosis not present

## 2022-02-07 DIAGNOSIS — Z510 Encounter for antineoplastic radiation therapy: Secondary | ICD-10-CM | POA: Diagnosis not present

## 2022-02-07 DIAGNOSIS — C50412 Malignant neoplasm of upper-outer quadrant of left female breast: Secondary | ICD-10-CM | POA: Diagnosis not present

## 2022-02-07 DIAGNOSIS — Z170 Estrogen receptor positive status [ER+]: Secondary | ICD-10-CM | POA: Diagnosis not present

## 2022-02-07 LAB — RAD ONC ARIA SESSION SUMMARY
Course Elapsed Days: 29
Plan Fractions Treated to Date: 16
Plan Fractions Treated to Date: 16
Plan Prescribed Dose Per Fraction: 1.8 Gy
Plan Prescribed Dose Per Fraction: 1.8 Gy
Plan Total Fractions Prescribed: 28
Plan Total Fractions Prescribed: 28
Plan Total Prescribed Dose: 50.4 Gy
Plan Total Prescribed Dose: 50.4 Gy
Reference Point Dosage Given to Date: 28.8 Gy
Reference Point Dosage Given to Date: 28.8 Gy
Reference Point Session Dosage Given: 1.8 Gy
Reference Point Session Dosage Given: 1.8 Gy
Session Number: 16

## 2022-02-10 ENCOUNTER — Ambulatory Visit
Admission: RE | Admit: 2022-02-10 | Discharge: 2022-02-10 | Disposition: A | Discharge status: Home / Self Care | Payer: Medicaid Other | Source: Ambulatory Visit | Attending: Radiation Oncology | Admitting: Radiation Oncology

## 2022-02-10 ENCOUNTER — Other Ambulatory Visit: Payer: Self-pay

## 2022-02-10 DIAGNOSIS — C50412 Malignant neoplasm of upper-outer quadrant of left female breast: Secondary | ICD-10-CM | POA: Diagnosis not present

## 2022-02-10 DIAGNOSIS — C50511 Malignant neoplasm of lower-outer quadrant of right female breast: Secondary | ICD-10-CM | POA: Diagnosis not present

## 2022-02-10 DIAGNOSIS — Z170 Estrogen receptor positive status [ER+]: Secondary | ICD-10-CM | POA: Diagnosis not present

## 2022-02-10 DIAGNOSIS — Z510 Encounter for antineoplastic radiation therapy: Secondary | ICD-10-CM | POA: Diagnosis not present

## 2022-02-10 LAB — RAD ONC ARIA SESSION SUMMARY
Course Elapsed Days: 32
Plan Fractions Treated to Date: 17
Plan Fractions Treated to Date: 17
Plan Prescribed Dose Per Fraction: 1.8 Gy
Plan Prescribed Dose Per Fraction: 1.8 Gy
Plan Total Fractions Prescribed: 28
Plan Total Fractions Prescribed: 28
Plan Total Prescribed Dose: 50.4 Gy
Plan Total Prescribed Dose: 50.4 Gy
Reference Point Dosage Given to Date: 30.6 Gy
Reference Point Dosage Given to Date: 30.6 Gy
Reference Point Session Dosage Given: 1.8 Gy
Reference Point Session Dosage Given: 1.8 Gy
Session Number: 17

## 2022-02-11 ENCOUNTER — Other Ambulatory Visit: Payer: Self-pay

## 2022-02-11 ENCOUNTER — Other Ambulatory Visit: Payer: Medicaid Other

## 2022-02-11 ENCOUNTER — Ambulatory Visit
Admission: RE | Admit: 2022-02-11 | Discharge: 2022-02-11 | Disposition: A | Discharge status: Home / Self Care | Payer: Medicaid Other | Source: Ambulatory Visit | Attending: Radiation Oncology | Admitting: Radiation Oncology

## 2022-02-11 DIAGNOSIS — Z510 Encounter for antineoplastic radiation therapy: Secondary | ICD-10-CM | POA: Diagnosis not present

## 2022-02-11 DIAGNOSIS — Z170 Estrogen receptor positive status [ER+]: Secondary | ICD-10-CM | POA: Diagnosis not present

## 2022-02-11 DIAGNOSIS — C50511 Malignant neoplasm of lower-outer quadrant of right female breast: Secondary | ICD-10-CM | POA: Diagnosis not present

## 2022-02-11 DIAGNOSIS — C50412 Malignant neoplasm of upper-outer quadrant of left female breast: Secondary | ICD-10-CM | POA: Diagnosis not present

## 2022-02-11 LAB — RAD ONC ARIA SESSION SUMMARY
Course Elapsed Days: 33
Plan Fractions Treated to Date: 18
Plan Fractions Treated to Date: 18
Plan Prescribed Dose Per Fraction: 1.8 Gy
Plan Prescribed Dose Per Fraction: 1.8 Gy
Plan Total Fractions Prescribed: 28
Plan Total Fractions Prescribed: 28
Plan Total Prescribed Dose: 50.4 Gy
Plan Total Prescribed Dose: 50.4 Gy
Reference Point Dosage Given to Date: 32.4 Gy
Reference Point Dosage Given to Date: 32.4 Gy
Reference Point Session Dosage Given: 1.8 Gy
Reference Point Session Dosage Given: 1.8 Gy
Session Number: 18

## 2022-02-12 ENCOUNTER — Ambulatory Visit
Admission: RE | Admit: 2022-02-12 | Discharge: 2022-02-12 | Disposition: A | Discharge status: Home / Self Care | Payer: Medicaid Other | Source: Ambulatory Visit | Attending: Radiation Oncology | Admitting: Radiation Oncology

## 2022-02-12 ENCOUNTER — Encounter: Payer: Self-pay | Admitting: Physical Therapy

## 2022-02-12 ENCOUNTER — Other Ambulatory Visit: Payer: Self-pay

## 2022-02-12 ENCOUNTER — Ambulatory Visit: Payer: Medicaid Other | Admitting: Physical Therapy

## 2022-02-12 DIAGNOSIS — C50919 Malignant neoplasm of unspecified site of unspecified female breast: Secondary | ICD-10-CM

## 2022-02-12 DIAGNOSIS — C50511 Malignant neoplasm of lower-outer quadrant of right female breast: Secondary | ICD-10-CM | POA: Diagnosis not present

## 2022-02-12 DIAGNOSIS — Z510 Encounter for antineoplastic radiation therapy: Secondary | ICD-10-CM | POA: Diagnosis not present

## 2022-02-12 DIAGNOSIS — M25611 Stiffness of right shoulder, not elsewhere classified: Secondary | ICD-10-CM

## 2022-02-12 DIAGNOSIS — M25612 Stiffness of left shoulder, not elsewhere classified: Secondary | ICD-10-CM

## 2022-02-12 DIAGNOSIS — Z170 Estrogen receptor positive status [ER+]: Secondary | ICD-10-CM | POA: Diagnosis not present

## 2022-02-12 DIAGNOSIS — C50412 Malignant neoplasm of upper-outer quadrant of left female breast: Secondary | ICD-10-CM | POA: Diagnosis not present

## 2022-02-12 LAB — RAD ONC ARIA SESSION SUMMARY
Course Elapsed Days: 34
Plan Fractions Treated to Date: 19
Plan Fractions Treated to Date: 19
Plan Prescribed Dose Per Fraction: 1.8 Gy
Plan Prescribed Dose Per Fraction: 1.8 Gy
Plan Total Fractions Prescribed: 28
Plan Total Fractions Prescribed: 28
Plan Total Prescribed Dose: 50.4 Gy
Plan Total Prescribed Dose: 50.4 Gy
Reference Point Dosage Given to Date: 34.2 Gy
Reference Point Dosage Given to Date: 34.2 Gy
Reference Point Session Dosage Given: 1.8 Gy
Reference Point Session Dosage Given: 1.8 Gy
Session Number: 19

## 2022-02-12 NOTE — Therapy (Signed)
OUTPATIENT PHYSICAL THERAPY SHOULDER TREATMENT   Patient Name: Sharon Woods MRN: [id number] DOB:[birth year], 61 y.o., female Today's Date: 02/12/2022   PT End of Session - 02/12/22 0956     Visit Number 9    Number of Visits 17    Date for PT Re-Evaluation 02/21/22    Authorization Type [place] Medicaid [employer]    Authorization Time Period Authorizaed for 12/23/21-03/23/22 for 13 viists; Certification for 0/17/51-05/06/83;    Authorization - Visit Number 9    Authorization - Number of Visits 13    Progress Note Due on Visit 10    PT Start Time 1000    PT Stop Time 1030    PT Time Calculation (min) 30 min    Activity Tolerance Patient tolerated treatment well;No increased pain    Behavior During Therapy WFL for tasks assessed/performed                   Past Medical History:  Diagnosis Date   Anxiety    Cancer ([place])    Depression    Diabetes mellitus without complication ([place])    History of high cholesterol 2000   Hyperlipidemia    Hypertension    Patient denies medical problems    Thrombocytopenia ([place])    Past Surgical History:  Procedure Laterality Date   ABDOMINAL HYSTERECTOMY     AXILLARY SENTINEL NODE BIOPSY Left 11/15/2021   Procedure: AXILLARY SENTINEL NODE BIOPSY;  Surgeon: [name], MD;  Location: [facility] OR[facility];  Service: General;  Laterality: Left;   BREAST BIOPSY Right 08/21/2021   [place] bx 7:00 mass coil clip path pending   BREAST BIOPSY Right 08/21/2021   [place] bx of LN, hydro marker, path pending   BREAST BIOPSY Left 08/21/2021   [place] bx, heart marker, path pending   CESAREAN SECTION     x2   MASTECTOMY MODIFIED RADICAL Right 11/15/2021   Procedure: MASTECTOMY MODIFIED RADICAL;  Surgeon: [name], MD;  Location: [facility] OR[facility];  Service: General;  Laterality: Right;   NO PAST SURGERIES     TOTAL MASTECTOMY Left 11/15/2021   Procedure: TOTAL MASTECTOMY;  Surgeon: [name], MD;  Location: [facility] ORS;  Service: General;  Laterality: Left;    Patient Active Problem List   Diagnosis Date Noted   Abnormal gastrointestinal PET scan 01/10/2022   Diabetes mellitus ([place]) 12/05/2021   Status post bilateral mastectomy 11/21/2021   Bilateral breast cancer ([place]) 11/15/2021   Genetic testing 10/07/2021   Vaginitis 09/28/2021   Thrombocytopenia ([place]) 09/08/2021   Malignant neoplasm of left breast ([place]) 08/28/2021   Malignant neoplasm of lower-outer quadrant of right breast of female, estrogen receptor positive ([place]) 08/28/2021   Goals of care, counseling/discussion 08/28/2021   Severe recurrent major depression with psychotic features ([place]) 11/15/2015   Hypertension 11/15/2015   Noncompliance 11/15/2015   Severe major depression, single episode, with psychotic features, mood-congruent ([place]) 03/29/2015   Protein-calorie malnutrition, severe 03/26/2015   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    PCP: No PCP  REFERRING PROVIDER: [name] MD  REFERRING DIAG: Decreased bilat shoulder ROM post double masectomy  THERAPY DIAG:  Stiffness of right shoulder, not elsewhere classified  Stiffness of left shoulder, not elsewhere classified  Rationale for Evaluation and Treatment Rehabilitation  ONSET DATE: August 18th 2023  SUBJECTIVE:  SUBJECTIVE STATEMENT: Pt reports doing well overall with her shoulder mobility, and shoulder pain.   PERTINENT HISTORY: Pt is a 61 year old female presenting with bilat decreased shoulder ROM following double masectomy 11/15/21. Not currently under any cancer treatments, is seeking further treatments. No lymphedema, or treatment for this at this time. Reports since surgery she has decreased overhead motion. She has bilat pain at the axilla of her Ues. Pain currently 5/10; lowest pain score 3/10; worst pain 9/10. The pain she feels is  sharp and she also has numbness/tingling in the axilla across the top of bilat breasts. Pain is aggravated by reaching overhead, reaching behind her back, pulling, pushing... really anything I do with my arms. Laying on her back hurts as well. Pain is relieved by tylenol, pain medication , and ice. 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 Patient does not have a license or vehicle, relies on family and public transit. Pt denies N/V, B&B changes, unexplained weight fluctuation, saddle paresthesia, fever, night sweats, or unrelenting night pain at this time.  Pt poor historian, seems to not have understanding of current condition.  PMH gathered from chart review: 11/05/16 L simple mastectomy and R modified mastectomy following stage 2 breast cancer diagnosis with lymphnode involvement; recent cellulitis from drainage with course of antibiotics.   PAIN:  Are you having pain? Yes: NPRS scale: 4/10 Pain location: bilat axilla Pain description: tingling, sharp Aggravating factors: reaching overhead, reaching behind her back, pulling, pushing... really anything I do with my arms. Laying on her back hurts as well.  Relieving factors: tylenol, pain medication , and ice  PRECAUTIONS: None   OBJECTIVE:   UPPER EXTREMITY ROM:   Active /PROM at eval Right eval Left eval  Shoulder flexion 104/146 106/144  Shoulder extension WNL WNL  Shoulder abduction 92/106 119/135  Shoulder internal rotation (apleys) T12/WNL T12/WNL  Shoulder external rotation (apleys) C5/WNL CTJ/WNL  Elbow flexion WNL WNL  Elbow extension WNL WNL  (Blank rows = not tested)      TODAY'S TREATMENT: There.ex:  Nu Step L2 for 5 minutes seat 7 UE 11 for gentle protraction <> retraction strengthening/movement Use of towel roll along thoracic spine to improve trunk rotation.  Prone Y 3x 10 with cuing needed for scapulohumeral rhythm with good carry  over Standing alt diagonal band pulls RTB 2x 12 with cuing for initial technique with decent carry over following Overhead ball slams 15# 2x 6 with good carry over of initial demo  Quadruped alt UE and opposite LE liftoffs 1 set of 5 reps bilaterally       PATIENT EDUCATION: Education details: Patient was educated on diagnosis, anatomy and pathology involved, prognosis, role of PT, and was given an HEP, demonstrating exercise with proper form following verbal and tactile cues, and was given a paper hand out to continue exercise at home. Pt was educated on and agreed to plan of care.  Person educated: Patient Education method: Explanation, Demonstration, and Handouts Education comprehension: verbalized understanding, returned demonstration, and tactile cues required   HOME EXERCISE PROGRAM: Access Code: [email or web address] URL: [email or web address] Date: 12/25/2021 Prepared by: Rebbeca Paul  Exercises - Seated Thoracic Lumbar Extension with Pectoralis Stretch  - 3 x daily - 7 x weekly - 12 reps - 2sec hold - Seated Shoulder Abduction Towel Slide at Table Top  - 3 x daily - 7 x weekly - 1 sets - 3 reps -  30 hold - Seated Shoulder Abduction AAROM with Pulley Behind  - 3 x daily - 7 x weekly - 12 reps - 2sec hold - Seated Shoulder Flexion AAROM with Pulley Behind  - 3 x daily - 7 x weekly - 12 reps - 2sec hold  ASSESSMENT:  CLINICAL IMPRESSION: PT continued therex targeting thoracic spine and shoulder strengthening and mobility. Pt is able to comply with all cuing for proper technique of all therex with good effort throughout session.  Pt reports no increased pain throughout session. PT will continue progression as able.    OBJECTIVE IMPAIRMENTS decreased activity tolerance, decreased coordination, decreased endurance, decreased mobility, decreased ROM, decreased strength, increased muscle spasms, impaired flexibility, impaired tone, impaired UE functional use, improper body  mechanics, postural dysfunction, and pain.   ACTIVITY LIMITATIONS carrying, lifting, sleeping, transfers, bathing, dressing, reach over head, and hygiene/grooming  PARTICIPATION LIMITATIONS: meal prep, cleaning, laundry, shopping, community activity, and occupation  PERSONAL FACTORS Age, Education, Fitness, Past/current experiences, Sex, Social background, Time since onset of injury/illness/exacerbation, Transportation, and 3+ comorbidities: A&D, DB, HTN, history of cancer  are also affecting patient's functional outcome.   REHAB POTENTIAL: Good  CLINICAL DECISION MAKING: Evolving/moderate complexity  EVALUATION COMPLEXITY: Moderate   GOALS: Goals reviewed with patient? Yes  SHORT TERM GOALS: Target date: 03/12/2022  (Remove Blue Hyperlink)  Pt will be independent with HEP in order to improve strength and balance in order to decrease fall risk and improve function at home and work. Baseline: 12/18/21 HEP given  Goal status: INITIAL    LONG TERM GOALS: Target date: 04/09/2022  (Remove Blue Hyperlink)  Pt will decrease worst pain as reported on NPRS by at least 3 points in order to demonstrate clinically significant reduction in pain.  Baseline: 12/21/21 9/10 Goal status: INITIAL  2.  Patient will increase FOTO score to 61 to demonstrate predicted increase in functional mobility to complete ADLs  Baseline: 12/21/21 40 Goal status: INITIAL  3.  Pt will demonstrate 4+/5 gross periscapular MMT in order to complete heavy household ADLs Baseline: 12/21/21 Y lower trap 3+/4- ; T Scapular retractors 4-/4 Goal status: INITIAL  4.  Pt will demonstrate full shoulder ROM in order to complete household and self care ADLs Baseline: R/L flex 104/106 abd: 92/119 ER: C5/WNL Goal status: INITIAL    PLAN: PT FREQUENCY: 2x/week  PT DURATION: 8 weeks  PLANNED INTERVENTIONS: Therapeutic exercises, Therapeutic activity, Neuromuscular re-education, Balance training, Gait training,  Patient/Family education, Self Care, Joint mobilization, Joint manipulation, DME instructions, Aquatic Therapy, Dry Needling, Electrical stimulation, Spinal manipulation, Spinal mobilization, Cryotherapy, Moist heat, Traction, Ultrasound, Manual therapy, and Re-evaluation  PLAN FOR NEXT SESSION: progress shoulder mobility and stregnthening.  Edison Nasuti A. Laurance Flatten, [place] DPT Physical Therapist- [facility]  02/12/2022, 10:41 AM

## 2022-02-13 ENCOUNTER — Ambulatory Visit (INDEPENDENT_AMBULATORY_CARE_PROVIDER_SITE_OTHER): Payer: Medicaid Other | Admitting: Nurse Practitioner

## 2022-02-13 ENCOUNTER — Inpatient Hospital Stay: Payer: Medicaid Other

## 2022-02-13 ENCOUNTER — Inpatient Hospital Stay (HOSPITAL_BASED_OUTPATIENT_CLINIC_OR_DEPARTMENT_OTHER): Payer: Medicaid Other | Admitting: Oncology

## 2022-02-13 ENCOUNTER — Other Ambulatory Visit: Payer: Self-pay

## 2022-02-13 ENCOUNTER — Encounter: Payer: Self-pay | Admitting: Oncology

## 2022-02-13 ENCOUNTER — Encounter: Payer: Self-pay | Admitting: Nurse Practitioner

## 2022-02-13 ENCOUNTER — Ambulatory Visit
Admission: RE | Admit: 2022-02-13 | Discharge: 2022-02-13 | Disposition: A | Discharge status: Home / Self Care | Payer: Medicaid Other | Source: Ambulatory Visit | Attending: Radiation Oncology | Admitting: Radiation Oncology

## 2022-02-13 VITALS — BP 133/74 | HR 76 | Temp 97.8°F

## 2022-02-13 VITALS — BP 139/84 | HR 68 | Temp 97.4°F | Resp 18 | Wt 163.0 lb

## 2022-02-13 DIAGNOSIS — Z1211 Encounter for screening for malignant neoplasm of colon: Secondary | ICD-10-CM

## 2022-02-13 DIAGNOSIS — C50911 Malignant neoplasm of unspecified site of right female breast: Secondary | ICD-10-CM

## 2022-02-13 DIAGNOSIS — D696 Thrombocytopenia, unspecified: Secondary | ICD-10-CM | POA: Diagnosis not present

## 2022-02-13 DIAGNOSIS — C50511 Malignant neoplasm of lower-outer quadrant of right female breast: Secondary | ICD-10-CM | POA: Diagnosis not present

## 2022-02-13 DIAGNOSIS — C50412 Malignant neoplasm of upper-outer quadrant of left female breast: Secondary | ICD-10-CM | POA: Diagnosis not present

## 2022-02-13 DIAGNOSIS — C50912 Malignant neoplasm of unspecified site of left female breast: Secondary | ICD-10-CM

## 2022-02-13 DIAGNOSIS — Z510 Encounter for antineoplastic radiation therapy: Secondary | ICD-10-CM | POA: Diagnosis not present

## 2022-02-13 DIAGNOSIS — K921 Melena: Secondary | ICD-10-CM | POA: Diagnosis not present

## 2022-02-13 DIAGNOSIS — E1169 Type 2 diabetes mellitus with other specified complication: Secondary | ICD-10-CM | POA: Diagnosis not present

## 2022-02-13 DIAGNOSIS — Z170 Estrogen receptor positive status [ER+]: Secondary | ICD-10-CM

## 2022-02-13 DIAGNOSIS — F323 Major depressive disorder, single episode, severe with psychotic features: Secondary | ICD-10-CM | POA: Diagnosis not present

## 2022-02-13 DIAGNOSIS — R948 Abnormal results of function studies of other organs and systems: Secondary | ICD-10-CM

## 2022-02-13 DIAGNOSIS — C50919 Malignant neoplasm of unspecified site of unspecified female breast: Secondary | ICD-10-CM

## 2022-02-13 LAB — COMPREHENSIVE METABOLIC PANEL
ALT: 16 U/L (ref 0–44)
AST: 23 U/L (ref 15–41)
Albumin: 3.9 g/dL (ref 3.5–5.0)
Alkaline Phosphatase: 58 U/L (ref 38–126)
Anion gap: 10 (ref 5–15)
BUN: 13 mg/dL (ref 6–20)
CO2: 26 mmol/L (ref 22–32)
Calcium: 9.3 mg/dL (ref 8.9–10.3)
Chloride: 104 mmol/L (ref 98–111)
Creatinine, Ser: 0.66 mg/dL (ref 0.44–1.00)
GFR, Estimated: >60 mL/min (ref >60)
Glucose, Bld: 138 mg/dL — ABNORMAL HIGH (ref 70–99)
Potassium: 3.5 mmol/L (ref 3.5–5.1)
Sodium: 140 mmol/L (ref 135–145)
Total Bilirubin: 0.8 mg/dL (ref 0.3–1.2)
Total Protein: 7.2 g/dL (ref 6.5–8.1)

## 2022-02-13 LAB — CBC WITH DIFFERENTIAL/PLATELET
Abs Immature Granulocytes: 0 10*3/uL (ref 0.00–0.07)
Basophils Absolute: 0 10*3/uL (ref 0.0–0.1)
Basophils Relative: 0 %
Eosinophils Absolute: 0 10*3/uL (ref 0.0–0.5)
Eosinophils Relative: 1 %
HCT: 39.3 % (ref 36.0–46.0)
Hemoglobin: 13 g/dL (ref 12.0–15.0)
Immature Granulocytes: 0 %
Lymphocytes Relative: 16 %
Lymphs Abs: 0.4 10*3/uL — ABNORMAL LOW (ref 0.7–4.0)
MCH: 29.5 pg (ref 26.0–34.0)
MCHC: 33.1 g/dL (ref 30.0–36.0)
MCV: 89.3 fL (ref 80.0–100.0)
Monocytes Absolute: 0.2 10*3/uL (ref 0.1–1.0)
Monocytes Relative: 8 %
Neutro Abs: 1.9 10*3/uL (ref 1.7–7.7)
Neutrophils Relative %: 75 %
Platelets: 55 10*3/uL — ABNORMAL LOW (ref 150–400)
RBC: 4.4 MIL/uL (ref 3.87–5.11)
RDW: 13.7 % (ref 11.5–15.5)
WBC: 2.5 10*3/uL — ABNORMAL LOW (ref 4.0–10.5)
nRBC: 0 % (ref 0.0–0.2)

## 2022-02-13 LAB — RAD ONC ARIA SESSION SUMMARY
Course Elapsed Days: 35
Plan Fractions Treated to Date: 20
Plan Fractions Treated to Date: 20
Plan Prescribed Dose Per Fraction: 1.8 Gy
Plan Prescribed Dose Per Fraction: 1.8 Gy
Plan Total Fractions Prescribed: 28
Plan Total Fractions Prescribed: 28
Plan Total Prescribed Dose: 50.4 Gy
Plan Total Prescribed Dose: 50.4 Gy
Reference Point Dosage Given to Date: 36 Gy
Reference Point Dosage Given to Date: 36 Gy
Reference Point Session Dosage Given: 1.8 Gy
Reference Point Session Dosage Given: 1.8 Gy
Session Number: 20

## 2022-02-13 LAB — HEMOGLOBIN A1C
Hgb A1c MFr Bld: 5.4 % (ref 4.8–5.6)
Mean Plasma Glucose: 108.28 mg/dL

## 2022-02-13 NOTE — Assessment & Plan Note (Signed)
Etiology unknown.  Could be related to mild splenomegaly.  Acute on chronic thrombocytopenia, acute component likely is due to radiation.

## 2022-02-13 NOTE — Progress Notes (Signed)
Hematology/Oncology Progress note [phone] [phone]        Patient Care Team: Theresia Lo, NP as PCP - General (Nurse Practitioner) Loletta Parish, RN Earlie Server, MD as Consulting Physician (Oncology) Daiva Huge, RN as Oncology Nurse Navigator  ASSESSMENT & PLAN:   Cancer Staging  Malignant neoplasm of left breast [facility]) Staging form: Breast, AJCC 8th Edition - Pathologic stage from 11/25/2021: Stage IB (pT2, pN1, cM0, G2, ER+, PR+, HER2-) - Signed by Earlie Server, MD on 11/25/2021  Malignant neoplasm of lower-outer quadrant of right breast of female, estrogen receptor positive ([place]) Staging form: Breast, AJCC 8th Edition - Pathologic stage from 11/25/2021: Stage IIIA (pT1c, pN3a, cM0, G2, ER+, PR+, HER2-) - Signed by Earlie Server, MD on 11/25/2021   Malignant neoplasm of left breast [facility]) # Left breast, invasive lobular carcinoma Grade 2, 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  pT2 pN1a, ER +90%, PR +90%, HER2 negative (1+)  # Right Breast invasive lobular carcinoma Grade 2, negative margins, right axillary lymph nodes  30/32 involved with metastatic carcinoma, pT1c pN3a ER +90%, PR +51-90%, HER2 negative (1+)  Patient has poor insights of her condition. Not a candidate for adjuvant IV chemotherapy. Follow up with RadOnc for Adjuvant radiation.  Plan adjuvant endocrine therapy with CKD 4/5 inhibitors after she finishes RT  Malignant neoplasm of lower-outer quadrant of right breast of female, estrogen receptor positive ([place]) See above plan.  Severe major depression, single episode, with psychotic features, mood-congruent ([place]) Not on any antidepressants.  Referred to psychiatrist. She has not established care  Thrombocytopenia ([place]) Etiology unknown.  Could be related to mild splenomegaly.   Acute on chronic thrombocytopenia, acute component likely is due to radiation.    Abnormal gastrointestinal PET scan She has been referred to establish care with GI and no showed.  She has appt in Feb 2024   Orders Placed This Encounter  Procedures   CBC with Differential/Platelet    Standing Status:   Future    Standing Expiration Date:   02/13/2023   Comprehensive metabolic panel    Standing Status:   Future    Standing Expiration Date:   02/13/2023   Follow up Lab MD 1 month.  [name], questions or concerns.  Earlie Server, MD, PhD [facility] Hematology Oncology 02/13/2022   CHIEF COMPLAINTS/REASON FOR VISIT:  bilateral breast cancer  HISTORY OF PRESENTING ILLNESS:  Sharon Woods is a  61 y.o.  female presents for follow up of bilateral breast cancer  Oncology History  Malignant neoplasm of left breast ([place])  08/06/2021 Mammogram   08/06/2021, digital bilateral mammogram and ultrasound showed left breast 3:00 mass, 1.7 x 1.7 x 1.7 cm, ultrasound of the left axillary is negative. 08/21/2021 right breast 1 x 0.7 x 0.8 cm angulated spiculated mass at the right breast 7:00, 5 cm from nipple.  Ultrasound of the right axillary demonstrates 3 abnormal thickened cortex lymph node. 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 Franki Cabot right breast 7:00, invasive mammary carcinoma with lobular features, in situ carcinoma,  ER+, PR+, HER 2-This was found to be concordant by Dr. Franki Cabot. right axilla lymph  node +, extracapsular extension.  -This was found to be concordant by Dr. Franki Cabot   08/28/2021 Initial Diagnosis   Malignant neoplasm of upper-outer quadrant of left breast in female, estrogen receptor positive ([place])   08/28/2021  Imaging   CT CHEST, ABDOMEN, AND PELVIS WITH CONTRAST Asymmetric mildly enlarged right axillary nodes. Correlate with biopsy. There are some punctate foci of sclerosis in the included spine that could reflect subtle metastatic disease. Nonobstructing bilateral renal calculi.   08/29/2021 Imaging   BILATERAL BREAST MRI WITH AND WITHOUT CONTRAST 1. 2.5 centimeter enhancing mass in the LOWER OUTER QUADRANT of the RIGHT breast, correlating with known malignancy. 2. At least 4 enlarged RIGHT axillary lymph nodes, correlating well with recently biopsied lymph node showing metastatic disease. 3. 3.4 centimeter enhancing mass in the LEFT breast, with an additional 2.9 centimeters of non mass enhancement posterior to the mass also suspicious for malignancy. This likely correlates with the area calcifications seen mammographically. 4. 5 millimeters satellite nodule along the LATERAL aspect of known malignancy in the LOWER OUTER QUADRANT of the LEFT breast. 5. LEFT axilla is negative for adenopathy.       09/05/2021 Imaging   NUCLEAR MEDICINE WHOLE BODY BONE SCAN Uptake at L2 which is nonspecific but may be related to advanced degenerative disc and facet disease changes at both L1-L2 and L2-L3; no evidence of osseous metastatic disease by CT. No definite osseous metastatic lesions identified.    Genetic Testing   Negative genetic testing. No pathogenic variants identified on the [facility] CancerNext-Expanded+RNA panel. The report date is 10/03/2021.  The CancerNext-Expanded + RNAinsight gene panel offered by [employer] and includes sequencing and rearrangement analysis for the following 77 genes: IP, ALK, APC*, ATM*, AXIN2, BAP1, BARD1, BLM, BMPR1A, BRCA1*, BRCA2*, BRIP1*, CDC73, CDH1*,CDK4, CDKN1B, CDKN2A, CHEK2*, CTNNA1, DICER1, FANCC, FH, FLCN, GALNT12, KIF1B, LZTR1, MAX, MEN1, MET, MLH1*, MSH2*, MSH3, MSH6*, MUTYH*, NBN, NF1*, NF2, NTHL1, PALB2*, PHOX2B, PMS2*, POT1, PRKAR1A, PTCH1, PTEN*, RAD51C*,  RAD51D*,RB1, RECQL, RET, SDHA, SDHAF2, SDHB, SDHC, SDHD, SMAD4, SMARCA4, SMARCB1, SMARCE1, STK11, SUFU, TMEM127, TP53*,TSC1, TSC2, VHL and XRCC2 (sequencing and deletion/duplication); EGFR, EGLN1, HOXB13, KIT, MITF, PDGFRA, POLD1 and POLE (sequencing only); EPCAM and GREM1 (deletion/duplication only).   11/15/2021 Surgery   She underwent left simple mastectomy and right modified mastectomy.   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  pT2 pN1a, ER +90%, PR +90%, HER2 negative (1+)  # Right Breast invasive lobular carcinoma Grade 2, negative margins, right axillary lymph nodes  30/32 involved with metastatic carcinoma, pT1c pN3a ER +90%, PR +51-90%, HER2 negative (1+)    11/25/2021 Cancer Staging   Staging form: Breast, AJCC 8th Edition - Pathologic stage from 11/25/2021: Stage IB (pT2, pN1, cM0, G2, ER+, PR+, HER2-) - Signed by Earlie Server, MD on 11/25/2021 Stage prefix: Initial diagnosis Multigene prognostic tests performed: None Histologic grading system: 3 grade system   Malignant neoplasm of lower-outer quadrant of right breast of female, estrogen receptor positive ([place])  08/06/2021 Mammogram   08/06/2021, digital bilateral mammogram and ultrasound showed left breast 3:00 mass, 1.7 x 1.7 x 1.7 cm, ultrasound of the left axillary is negative. 08/21/2021 right breast 1 x 0.7 x 0.8 cm angulated spiculated mass at the right breast 7:00, 5 cm from nipple.  Ultrasound of the right axillary demonstrates 3 abnormal thickened cortex lymph node. Patient was recommended  to proceed with biopsy left breast 3:00 invasive mammry carcinoma with lobular features. in situ carcinoma, lobular neoplasia. 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 Franki Cabot right breast 7:00, invasive  mammary carcinoma with lobular features, in situ carcinoma,  ER+, PR+, HER 2-This was found to be concordant by Dr. Franki Cabot. right axilla lymph node +, extracapsular extension.  -This was found to be concordant by Dr. Franki Cabot   08/28/2021 Initial Diagnosis   Malignant neoplasm of lower-outer quadrant of right breast of female, estrogen receptor positive ([place])   08/28/2021 Imaging   CT CHEST, ABDOMEN, AND PELVIS WITH CONTRAST Asymmetric mildly enlarged right axillary nodes. Correlate with biopsy. There are some punctate foci of sclerosis in the included spine that could reflect subtle metastatic disease. Nonobstructing bilateral renal calculi.   08/29/2021 Imaging   BILATERAL BREAST MRI WITH AND WITHOUT CONTRAST 1. 2.5 centimeter enhancing mass in the LOWER OUTER QUADRANT of the RIGHT breast, correlating with known malignancy. 2. At least 4 enlarged RIGHT axillary lymph nodes, correlating well with recently biopsied lymph node showing metastatic disease. 3. 3.4 centimeter enhancing mass in the LEFT breast, with an additional 2.9 centimeters of non mass enhancement posterior to the mass also suspicious for malignancy. This likely correlates with the area calcifications seen mammographically. 4. 5 millimeters satellite nodule along the LATERAL aspect of known malignancy in the LOWER OUTER QUADRANT of the LEFT breast. 5. LEFT axilla is negative for adenopathy.       09/05/2021 Imaging   NUCLEAR MEDICINE WHOLE BODY BONE SCAN Uptake at L2 which is nonspecific but may be related to advanced degenerative disc and facet disease changes at both L1-L2 and L2-L3; no evidence of osseous metastatic disease by CT. No definite osseous metastatic lesions identified.   09/09/2021 Oncotype testing   [id number] block RIGHT 7:00 5 CM FN; ULTRASOUND-GUIDED BIOPSY Oncotype Dx RS score 22    Genetic Testing   Negative genetic testing. No pathogenic variants identified on the [facility]  CancerNext-Expanded+RNA panel. The report date is 10/03/2021.  The CancerNext-Expanded + RNAinsight gene panel offered by [employer] and includes sequencing and rearrangement analysis for the following 77 genes: IP, ALK, APC*, ATM*, AXIN2, BAP1, BARD1, BLM, BMPR1A, BRCA1*, BRCA2*, BRIP1*, CDC73, CDH1*,CDK4, CDKN1B, CDKN2A, CHEK2*, CTNNA1, DICER1, FANCC, FH, FLCN, GALNT12, KIF1B, LZTR1, MAX, MEN1, MET, MLH1*, MSH2*, MSH3, MSH6*, MUTYH*, NBN, NF1*, NF2, NTHL1, PALB2*, PHOX2B, PMS2*, POT1, PRKAR1A, PTCH1, PTEN*, RAD51C*, RAD51D*,RB1, RECQL, RET, SDHA, SDHAF2, SDHB, SDHC, SDHD, SMAD4, SMARCA4, SMARCB1, SMARCE1, STK11, SUFU, TMEM127, TP53*,TSC1, TSC2, VHL and XRCC2 (sequencing and deletion/duplication); EGFR, EGLN1, HOXB13, KIT, MITF, PDGFRA, POLD1 and POLE (sequencing only); EPCAM and GREM1 (deletion/duplication only).   11/15/2021 Surgery   She underwent left simple mastectomy and right modified mastectomy.   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  pT2 pN1a, ER +90%, PR +90%, HER2 negative (1+)  # Right Breast invasive lobular carcinoma Grade 2, negative margins, right axillary lymph nodes  30/32 involved with metastatic carcinoma, pT1c pN3a ER +90%, PR +51-90%, HER2 negative (1+)    11/25/2021 Cancer Staging   Staging form: Breast, AJCC 8th Edition - Pathologic stage from 11/25/2021: Stage IIIA (pT1c, pN3a, cM0, G2, ER+, PR+, HER2-) - Signed by Earlie Server, MD on 11/25/2021 Stage prefix: Initial diagnosis Multigene prognostic tests performed: None Histologic grading system: 3  grade system   12/10/2021 Imaging   PET scan  1. Interval bilateral mastectomy and right axillary node dissection.No evidence of residual disease in the chest wall, nodal metastases or distant metastases. 2.  Focal hypermetabolic activity in the proximal rectum corresponding with an intraluminal polypoid lesion, suspicious for a villous adenoma or early colon cancer. Sigmoidoscopy/colonoscopy recommended unless recently performed   12/10/2021 Imaging   1. Interval bilateral mastectomy and right axillary node dissection.No evidence of residual disease in the chest wall, nodal metastasesor distant metastases. 2. Focal hypermetabolic activity in the proximal rectum corresponding with an intraluminal polypoid lesion, suspicious for a villous adenoma or early colon cancer. Sigmoidoscopy/colonoscopy recommended unless recently performed    She is a poor historian. History of major depression, psychosis, previously on olanzapine and Remeron not currently on any and if not currently following up with psychiatrist. Patient's family history is positive for sister with breast cancer   INTERVAL HISTORY Sharon Woods is a 61 y.o. female who has above history reviewed by me today presents for follow up visit for management of bilateral breast cancer She has had bilateral mastectomy.  Poor historian. No fever chills. She started on RT. She reports compliant with diabetes treatment.  She has appointment with primary care physician today.      Review of Systems  Constitutional:  Negative for appetite change, chills, fatigue and fever.  HENT:   Negative for hearing loss and voice change.   Eyes:  Negative for eye problems.  Respiratory:  Negative for chest tightness and cough.   Cardiovascular:  Negative for chest pain.  Gastrointestinal:  Negative for abdominal distention, abdominal pain and blood in stool.  Endocrine: Negative for hot flashes.  Genitourinary:  Negative for difficulty urinating and frequency.   Musculoskeletal:  Negative for arthralgias.  Skin:  Negative for itching and rash.  Neurological:  Negative for extremity weakness.  Hematological:  Negative for adenopathy.   Psychiatric/Behavioral:  Negative for confusion.     MEDICAL HISTORY:  Past Medical History:  Diagnosis Date   Anxiety    Cancer ([place])    Depression    Diabetes mellitus without complication ([place])    History of high cholesterol 2000   Hyperlipidemia    Hypertension    Patient denies medical problems    Thrombocytopenia ([place])     SURGICAL HISTORY: Past Surgical History:  Procedure Laterality Date   ABDOMINAL HYSTERECTOMY     AXILLARY SENTINEL NODE BIOPSY Left 11/15/2021   Procedure: AXILLARY SENTINEL NODE BIOPSY;  Surgeon: [name], MD;  Location: [facility];  Service: General;  Laterality: Left;   BREAST BIOPSY Right 08/21/2021   [place] bx 7:00 mass coil clip path pending   BREAST BIOPSY Right 08/21/2021   [place] bx of LN, hydro marker, path pending   BREAST BIOPSY Left 08/21/2021   [place] bx, heart marker, path pending   CESAREAN SECTION     x2   MASTECTOMY MODIFIED RADICAL Right 11/15/2021   Procedure: MASTECTOMY MODIFIED RADICAL;  Surgeon: [name], MD;  Location: [facility];  Service: General;  Laterality: Right;   NO PAST SURGERIES     TOTAL MASTECTOMY Left 11/15/2021   Procedure: TOTAL MASTECTOMY;  Surgeon: [name], MD;  Location: [facility];  Service: General;  Laterality: Left;    SOCIAL HISTORY: Social History   Socioeconomic History   Marital status: Married    Spouse name: Not on file   Number of children: Not on file   Years of education: Not on  file   Highest education level: Not on file  Occupational History   Not on file  Tobacco Use   Smoking status: Former    Types: Cigarettes    Quit date: 2013    Years since quitting: 10.8    Passive exposure: Current   Smokeless tobacco: Never  Vaping Use   Vaping Use: Never used  Substance and Sexual Activity   Alcohol use: Not Currently    Comment: occasionally   Drug use: No   Sexual activity: Not Currently    Comment: unable to assess   Other Topics Concern   Not on file  Social History  Narrative   Not on file   Social Determinants of Health   Financial Resource Strain: Medium Risk (10/10/2021)   Overall Financial Resource Strain (CARDIA)    Difficulty of Paying Living Expenses: Somewhat hard  Food Insecurity: No Food Insecurity (10/10/2021)   Hunger Vital Sign    Worried About Running Out of Food in the Last Year: Never true    Ran Out of Food in the Last Year: Never true  Transportation Needs: Unmet Transportation Needs (01/21/2022)   PRAPARE - Transportation    Lack of Transportation (Medical): Yes    Lack of Transportation (Non-Medical): Yes  Physical Activity: Inactive (10/10/2021)   Exercise Vital Sign    Days of Exercise per Week: 0 days    Minutes of Exercise per Session: 0 min  Stress: Stress Concern Present (10/10/2021)   [place]    Feeling of Stress : To some extent  Social Connections: Socially Isolated (10/10/2021)   Social Connection and Isolation Panel [NHANES]    Frequency of Communication with Friends and Family: Once a week    Frequency of Social Gatherings with Friends and Family: Never    Attends Religious Services: Never    Printmaker: No    Attends Music therapist: Never    Marital Status: Married  Human resources officer Violence: Not on file    FAMILY HISTORY: Family History  Problem Relation Age of Onset   Lung cancer Mother    Dementia Mother    Parkinson's disease Father    Cancer Father        unk type   Diabetes Sister    Heart attack Sister    Breast cancer Sister    Cancer Maternal Uncle        unk types   Dementia Maternal Grandmother    Cancer Maternal Grandmother        unk type   Cancer Other    Dementia Other     ALLERGIES:  is allergic to tylenol [acetaminophen] and aleve [naproxen].  MEDICATIONS:  Current Outpatient Medications  Medication Sig Dispense Refill   ACCU-CHEK GUIDE test strip USE TO CHECK  BLOOD SUGAR UP TO 4 TIMES DAILY AS DIRECTED 100 each 0   Accu-Chek Softclix Lancets lancets USE TO CHECK BLOOD SUAGR UP TO 4 TIMES DAILY AS DIRECTED 100 each 0   blood glucose meter kit and supplies KIT Dispense based on patient and insurance preference. Use up to four times daily as directed. 1 each 1   clotrimazole (LOTRIMIN) 1 % cream Apply 1 Application topically 2 (two) times daily. 30 g 0   escitalopram (LEXAPRO) 5 MG tablet Take 1 tablet (5 mg total) by mouth daily. 30 tablet 1   glipiZIDE (GLUCOTROL) 5 MG tablet Take 1 tablet (5 mg total) by  mouth daily before breakfast. 30 tablet 1   metFORMIN (GLUCOPHAGE) 500 MG tablet Take 1 tablet (500 mg total) by mouth daily. 90 tablet 1   oxyCODONE (OXY IR/ROXICODONE) 5 MG immediate release tablet Take 1 tablet (5 mg total) by mouth every 6 (six) hours as needed for up to 24 doses for severe pain or breakthrough pain. 24 tablet 0   No current facility-administered medications for this visit.     PHYSICAL EXAMINATION: ECOG PERFORMANCE STATUS: 1 - Symptomatic but completely ambulatory Vitals:   02/13/22 1134  BP: 139/84  Pulse: 68  Resp: 18  Temp: (!) 97.4 F (36.3 C)   Filed Weights   02/13/22 1134  Weight: 163 lb (73.9 kg)    Physical Exam Constitutional:      General: She is not in acute distress. HENT:     Head: Normocephalic and atraumatic.  Eyes:     General: No scleral icterus. Cardiovascular:     Rate and Rhythm: Normal rate.  Pulmonary:     Effort: Pulmonary effort is normal. No respiratory distress.     Breath sounds: No wheezing.  Abdominal:     General: There is no distension.  Musculoskeletal:        General: No deformity. Normal range of motion.     Cervical back: Normal range of motion and neck supple.  Skin:    Coloration: Skin is not jaundiced.  Neurological:     Mental Status: She is alert and oriented to person, place, and time. Mental status is at baseline.     LABORATORY DATA:  I have reviewed the  data as listed    Latest Ref Rng & Units 02/13/2022   10:51 AM 02/06/2022   10:34 AM 01/30/2022   10:47 AM  CBC  WBC 4.0 - 10.5 K/uL 2.5  2.9  3.4   Hemoglobin 12.0 - 15.0 g/dL 13.0  12.9  13.4   Hematocrit 36.0 - 46.0 % 39.3  39.3  41.3   Platelets 150 - 400 K/uL 55  58  67       Latest Ref Rng & Units 02/13/2022   10:51 AM 01/10/2022   11:07 AM 12/05/2021    1:08 PM  CMP  Glucose 70 - 99 mg/dL 138  101  203   BUN 6 - 20 mg/dL _0 Creatinine 0.44 - 1.00 mg/dL 0.66  0.68  0.71   Sodium 135 - 145 mmol/L 140  141  143   Potassium 3.5 - 5.1 mmol/L 3.5  3.5  3.6   Chloride 98 - 111 mmol/L 104  107  109   CO2 22 - 32 mmol/L _1 Calcium 8.9 - 10.3 mg/dL 9.3  9.7  9.1   Total Protein 6.5 - 8.1 g/dL 7.2  8.3    Total Bilirubin 0.3 - 1.2 mg/dL 0.8  0.7    Alkaline Phos 38 - 126 U/L 58  60    AST 15 - 41 U/L 23  26    ALT 0 - 44 U/L 16  20      RADIOGRAPHIC STUDIES: I have personally reviewed the radiological images as listed and agreed with the findings in the report. NM PET Image Initial (PI) Skull Base To Thigh  Result Date: 12/10/2021 CLINICAL DATA:  Initial treatment strategy for recently diagnosed bilateral breast cancer. EXAM: NUCLEAR MEDICINE PET SKULL BASE TO THIGH TECHNIQUE: 9.34 mCi F-18 FDG was injected intravenously. Full-ring PET  imaging was performed from the skull base to thigh after the radiotracer. CT data was obtained and used for attenuation correction and anatomic localization. Fasting blood glucose: 93 mg/dl COMPARISON:  Whole-body bone scan 09/04/2021, CT of the chest, abdomen and pelvis 08/28/2021 and abdominopelvic CT 12/04/2020. FINDINGS: Mediastinal blood pool activity: SUV max 1.9 NECK: No hypermetabolic cervical lymph nodes are identified.Fairly symmetric activity within the lymphoid tissue of Waldeyer's ring is within physiologic limits. No suspicious activity identified within the pharyngeal mucosal space. Incidental CT findings: none CHEST:  Interval bilateral mastectomy. Low level activity within the right axilla corresponding with ill-defined low-density attributed to postsurgical change (SUV max 2.7). No suspicious chest wall activity or residual axillary adenopathy. There are no hypermetabolic mediastinal, hilar, axillary or internal mammary lymph nodes. No hypermetabolic pulmonary activity or suspicious nodularity. Incidental CT findings: Interval bilateral mastectomy with a fluid collection or tissue expander in the left breast measuring 6.9 x 2.7 cm on image 104/2. ABDOMEN/PELVIS: There is no hypermetabolic activity within the liver, adrenal glands, spleen or pancreas. There is no hypermetabolic nodal activity in the abdomen or pelvis. There is focally intense (SUV max 6.3) metabolic activity within the proximal rectum, corresponding with a 1.3 cm soft tissue density on image 211/2. No other suspicious bowel activity. Incidental CT findings: Stable mild splenomegaly. Nonobstructing bilateral renal calculi. Mild aortic and branch vessel atherosclerosis. Previous hysterectomy. SKELETON: There is no hypermetabolic activity to suggest osseous metastatic disease. Incidental CT findings: Degenerative changes in the spine associated with a convex right thoracolumbar scoliosis. Punctate sclerotic lesions in the spine are unchanged and likely incidental bone islands. IMPRESSION: 1. Interval bilateral mastectomy and right axillary node dissection. No evidence of residual disease in the chest wall, nodal metastases or distant metastases. 2. Focal hypermetabolic activity in the proximal rectum corresponding with an intraluminal polypoid lesion, suspicious for a villous adenoma or early colon cancer. Sigmoidoscopy/colonoscopy recommended unless recently performed. Electronically Signed   By: Richardean Sale M.D.   On: 12/10/2021 13:28

## 2022-02-13 NOTE — Assessment & Plan Note (Signed)
She has been referred to establish care with GI and no showed.  She has appt in Feb 2024

## 2022-02-13 NOTE — Progress Notes (Signed)
Pt here for follow up. Pt reports she has some bleeding when she has a bowel movement

## 2022-02-13 NOTE — Assessment & Plan Note (Signed)
See above plan. 

## 2022-02-13 NOTE — Assessment & Plan Note (Signed)
Not on any antidepressants.  Referred to psychiatrist. She has not established care

## 2022-02-13 NOTE — Assessment & Plan Note (Signed)
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  pT2 pN1a, ER +90%, PR +90%, HER2 negative (1+)  # Right Breast invasive lobular carcinoma Grade 2, negative margins, right axillary lymph nodes  30/32 involved with metastatic carcinoma, pT1c pN3a ER +90%, PR +51-90%, HER2 negative (1+)  Patient has poor insights of her condition. Not a candidate for adjuvant IV chemotherapy. Follow up with RadOnc for Adjuvant radiation.  Plan adjuvant endocrine therapy with CKD 4/5 inhibitors after she finishes RT

## 2022-02-13 NOTE — Progress Notes (Signed)
New Patient Office Visit  Subjective    Patient ID: Sharon Woods, female    DOB: [birth year]  Age: 61 y.o. MRN: [id number]  CC:  Chief Complaint  Patient presents with   Establish Care    HPI [name] presents to establish care. He has history of diabetes, anxiety and breast cancer. She is on radiation treatment.  She has double mastectomy in August 2023.   Outpatient Encounter Medications as of 02/13/2022  Medication Sig   ACCU-CHEK GUIDE test strip USE TO CHECK BLOOD SUGAR UP TO 4 TIMES DAILY AS DIRECTED   Accu-Chek Softclix Lancets lancets USE TO CHECK BLOOD SUAGR UP TO 4 TIMES DAILY AS DIRECTED   blood glucose meter kit and supplies KIT Dispense based on patient and insurance preference. Use up to four times daily as directed.   metFORMIN (GLUCOPHAGE) 500 MG tablet Take 1 tablet (500 mg total) by mouth daily.   [DISCONTINUED] clotrimazole (LOTRIMIN) 1 % cream Apply 1 Application topically 2 (two) times daily.   [DISCONTINUED] escitalopram (LEXAPRO) 5 MG tablet Take 1 tablet (5 mg total) by mouth daily.   [DISCONTINUED] glipiZIDE (GLUCOTROL) 5 MG tablet Take 1 tablet (5 mg total) by mouth daily before breakfast. (Patient not taking: Reported on 03/04/2022)   [DISCONTINUED] oxyCODONE (OXY IR/ROXICODONE) 5 MG immediate release tablet Take 1 tablet (5 mg total) by mouth every 6 (six) hours as needed for up to 24 doses for severe pain or breakthrough pain. (Patient not taking: Reported on 03/04/2022)   No facility-administered encounter medications on file as of 02/13/2022.    Past Medical History:  Diagnosis Date   Anxiety    Cancer ([place])    Depression    Diabetes mellitus without complication ([place])    History of high cholesterol 2000   Hyperlipidemia    Hypertension    Patient denies medical problems    Thrombocytopenia ([place])     Past Surgical History:  Procedure Laterality Date   ABDOMINAL HYSTERECTOMY     AXILLARY SENTINEL NODE BIOPSY Left 11/15/2021    Procedure: AXILLARY SENTINEL NODE BIOPSY;  Surgeon: [name], MD;  Location: [facility] ORS;  Service: General;  Laterality: Left;   BREAST BIOPSY Right 08/21/2021   [place] bx 7:00 mass coil clip path pending   BREAST BIOPSY Right 08/21/2021   [place] bx of LN, hydro marker, path pending   BREAST BIOPSY Left 08/21/2021   [place] bx, heart marker, path pending   CESAREAN SECTION     x2   MASTECTOMY MODIFIED RADICAL Right 11/15/2021   Procedure: MASTECTOMY MODIFIED RADICAL;  Surgeon: [name], MD;  Location: [facility] ORS;  Service: General;  Laterality: Right;   NO PAST SURGERIES     TOTAL MASTECTOMY Left 11/15/2021   Procedure: TOTAL MASTECTOMY;  Surgeon: [name], MD;  Location: [facility] ORS;  Service: General;  Laterality: Left;    Family History  Problem Relation Age of Onset   Lung cancer Mother    Dementia Mother    Parkinson's disease Father    Cancer Father        unk type   Diabetes Sister    Heart attack Sister    Breast cancer Sister    Cancer Maternal Uncle        unk types   Dementia Maternal Grandmother    Cancer Maternal Grandmother        unk type   Cancer Other    Dementia Other     Social History  Socioeconomic History   Marital status: Married    Spouse name: Not on file   Number of children: Not on file   Years of education: Not on file   Highest education level: Not on file  Occupational History   Not on file  Tobacco Use   Smoking status: Former    Types: Cigarettes    Quit date: 2013    Years since quitting: 10.9    Passive exposure: Current   Smokeless tobacco: Never  Vaping Use   Vaping Use: Never used  Substance and Sexual Activity   Alcohol use: Not Currently    Comment: occasionally   Drug use: No   Sexual activity: Not Currently    Comment: unable to assess   Other Topics Concern   Not on file  Social History Narrative   Not on file   Social Determinants of Health   Financial Resource Strain: Medium Risk (10/10/2021)   Overall  Financial Resource Strain (CARDIA)    Difficulty of Paying Living Expenses: Somewhat hard  Food Insecurity: No Food Insecurity (10/10/2021)   Hunger Vital Sign    Worried About Running Out of Food in the Last Year: Never true    Ran Out of Food in the Last Year: Never true  Transportation Needs: Unmet Transportation Needs (01/21/2022)   PRAPARE - Transportation    Lack of Transportation (Medical): Yes    Lack of Transportation (Non-Medical): Yes  Physical Activity: Inactive (10/10/2021)   Exercise Vital Sign    Days of Exercise per Week: 0 days    Minutes of Exercise per Session: 0 min  Stress: Stress Concern Present (10/10/2021)   [place]    Feeling of Stress : To some extent  Social Connections: Socially Isolated (10/10/2021)   Social Connection and Isolation Panel [NHANES]    Frequency of Communication with Friends and Family: Once a week    Frequency of Social Gatherings with Friends and Family: Never    Attends Religious Services: Never    Marine scientist or Organizations: No    Attends Archivist Meetings: Never    Marital Status: Married  Human resources officer Violence: Not on file    Review of Systems  Constitutional: Negative.   HENT: Negative.    Eyes: Negative.   Respiratory:  Negative for cough and shortness of breath.   Cardiovascular:  Negative for palpitations and leg swelling.  Gastrointestinal:  Positive for blood in stool.  Genitourinary: Negative.   Musculoskeletal: Negative.   Skin: Negative.   Neurological: Negative.   Psychiatric/Behavioral: Negative.          Objective    BP 133/74   Pulse 76   Temp 97.8 F (36.6 C) (Temporal)   LMP  (LMP Unknown) Comment: Patient is not a good historian  SpO2 98%   Physical Exam Constitutional:      Appearance: Normal appearance.  HENT:     Right Ear: Tympanic membrane normal.     Left Ear: Tympanic membrane normal.      Mouth/Throat:     Mouth: Mucous membranes are moist.  Eyes:     Conjunctiva/sclera: Conjunctivae normal.     Pupils: Pupils are equal, round, and reactive to light.  Cardiovascular:     Rate and Rhythm: Normal rate and regular rhythm.     Pulses: Normal pulses.     Heart sounds: Normal heart sounds.  Pulmonary:     Effort: Pulmonary effort is  normal.     Breath sounds: Normal breath sounds.  Abdominal:     General: Bowel sounds are normal. There is distension.     Palpations: Abdomen is soft. There is no mass.     Hernia: No hernia is present.  Skin:    General: Skin is warm.  Neurological:     General: No focal deficit present.     Mental Status: She is alert and oriented to person, place, and time. Mental status is at baseline.  Psychiatric:        Mood and Affect: Mood normal.        Behavior: Behavior normal.        Thought Content: Thought content normal.        Judgment: Judgment normal.         Assessment & Plan:   Problem List Items Addressed This Visit       Endocrine   Diabetes mellitus ([place]) - Primary (Chronic)    Encouraged her to consume variety of food including fruits, vegetables, whole grains, complex carbohydrates and proteins.  Continue metformin and glipizide. Her hemoglobin A1c 5.4 on 02/13/2022        Other   Bilateral breast cancer [facility])    She has double mastectomy. She is doing for radiation therapy      Blood in stool    Patient complains of blood in stool. Will refer her to the GI      Relevant Orders   Ambulatory referral to Gastroenterology   Other Visit Diagnoses     Colon cancer screening       Relevant Orders   Ambulatory referral to Gastroenterology       No follow-ups on file.   Theresia Lo, NP

## 2022-02-14 ENCOUNTER — Ambulatory Visit
Admission: RE | Admit: 2022-02-14 | Discharge: 2022-02-14 | Disposition: A | Discharge status: Home / Self Care | Payer: Medicaid Other | Source: Ambulatory Visit | Attending: Radiation Oncology | Admitting: Radiation Oncology

## 2022-02-14 ENCOUNTER — Other Ambulatory Visit: Payer: Self-pay

## 2022-02-14 DIAGNOSIS — Z170 Estrogen receptor positive status [ER+]: Secondary | ICD-10-CM | POA: Diagnosis not present

## 2022-02-14 DIAGNOSIS — C50412 Malignant neoplasm of upper-outer quadrant of left female breast: Secondary | ICD-10-CM | POA: Diagnosis not present

## 2022-02-14 DIAGNOSIS — C50511 Malignant neoplasm of lower-outer quadrant of right female breast: Secondary | ICD-10-CM | POA: Diagnosis not present

## 2022-02-14 DIAGNOSIS — Z510 Encounter for antineoplastic radiation therapy: Secondary | ICD-10-CM | POA: Diagnosis not present

## 2022-02-14 LAB — RAD ONC ARIA SESSION SUMMARY
Course Elapsed Days: 36
Plan Fractions Treated to Date: 21
Plan Fractions Treated to Date: 21
Plan Prescribed Dose Per Fraction: 1.8 Gy
Plan Prescribed Dose Per Fraction: 1.8 Gy
Plan Total Fractions Prescribed: 28
Plan Total Fractions Prescribed: 28
Plan Total Prescribed Dose: 50.4 Gy
Plan Total Prescribed Dose: 50.4 Gy
Reference Point Dosage Given to Date: 37.8 Gy
Reference Point Dosage Given to Date: 37.8 Gy
Reference Point Session Dosage Given: 1.8 Gy
Reference Point Session Dosage Given: 1.8 Gy
Session Number: 21

## 2022-02-15 ENCOUNTER — Other Ambulatory Visit: Payer: Self-pay | Admitting: Internal Medicine

## 2022-02-15 DIAGNOSIS — N949 Unspecified condition associated with female genital organs and menstrual cycle: Secondary | ICD-10-CM

## 2022-02-17 ENCOUNTER — Other Ambulatory Visit: Payer: Self-pay

## 2022-02-17 ENCOUNTER — Ambulatory Visit
Admission: RE | Admit: 2022-02-17 | Discharge: 2022-02-17 | Disposition: A | Discharge status: Home / Self Care | Payer: Medicaid Other | Source: Ambulatory Visit | Attending: Radiation Oncology | Admitting: Radiation Oncology

## 2022-02-17 ENCOUNTER — Ambulatory Visit: Payer: Medicaid Other

## 2022-02-17 DIAGNOSIS — C50412 Malignant neoplasm of upper-outer quadrant of left female breast: Secondary | ICD-10-CM | POA: Diagnosis not present

## 2022-02-17 DIAGNOSIS — Z170 Estrogen receptor positive status [ER+]: Secondary | ICD-10-CM | POA: Diagnosis not present

## 2022-02-17 DIAGNOSIS — Z510 Encounter for antineoplastic radiation therapy: Secondary | ICD-10-CM | POA: Diagnosis not present

## 2022-02-17 DIAGNOSIS — C50511 Malignant neoplasm of lower-outer quadrant of right female breast: Secondary | ICD-10-CM | POA: Diagnosis not present

## 2022-02-17 LAB — RAD ONC ARIA SESSION SUMMARY
Course Elapsed Days: 39
Plan Fractions Treated to Date: 22
Plan Fractions Treated to Date: 22
Plan Prescribed Dose Per Fraction: 1.8 Gy
Plan Prescribed Dose Per Fraction: 1.8 Gy
Plan Total Fractions Prescribed: 28
Plan Total Fractions Prescribed: 28
Plan Total Prescribed Dose: 50.4 Gy
Plan Total Prescribed Dose: 50.4 Gy
Reference Point Dosage Given to Date: 39.6 Gy
Reference Point Dosage Given to Date: 39.6 Gy
Reference Point Session Dosage Given: 1.8 Gy
Reference Point Session Dosage Given: 1.8 Gy
Session Number: 22

## 2022-02-17 NOTE — Telephone Encounter (Signed)
Requested medications are due for refill today.  yes  Requested medications are on the active medications list.  yes  Last refill. 01/22/2022 30 g 0 rf  Future visit scheduled.   yes  Notes to clinic.  Medication not assigned to a protocol. PCP listed is Orthoptist.    Requested Prescriptions  Pending Prescriptions Disp Refills   clotrimazole (LOTRIMIN) 1 % cream [Pharmacy Med Name: Clotrimazole 1 % External Cream] 30 g 0    Sig: APPLY TOPICALLY TWO TIMES DAILY     Off-Protocol Failed - 02/15/2022  3:43 PM      Failed - Medication not assigned to a protocol, review manually.      Passed - Valid encounter within last 12 months    Recent Outpatient Visits           4 weeks ago Type 2 diabetes mellitus with other specified complication, without long-term current use of insulin [facility])   [facility] Teodora Medici, DO       Future Appointments             In 2 weeks Teodora Medici, DO [facility], [place]   In 2 months Theresia Lo, NP [facility]

## 2022-02-18 ENCOUNTER — Ambulatory Visit: Payer: Medicaid Other

## 2022-02-18 ENCOUNTER — Ambulatory Visit
Admission: RE | Admit: 2022-02-18 | Discharge: 2022-02-18 | Disposition: A | Discharge status: Home / Self Care | Payer: Medicaid Other | Source: Ambulatory Visit | Attending: Radiation Oncology | Admitting: Radiation Oncology

## 2022-02-18 ENCOUNTER — Other Ambulatory Visit: Payer: Self-pay

## 2022-02-18 DIAGNOSIS — Z170 Estrogen receptor positive status [ER+]: Secondary | ICD-10-CM | POA: Diagnosis not present

## 2022-02-18 DIAGNOSIS — C50412 Malignant neoplasm of upper-outer quadrant of left female breast: Secondary | ICD-10-CM | POA: Diagnosis not present

## 2022-02-18 DIAGNOSIS — C50511 Malignant neoplasm of lower-outer quadrant of right female breast: Secondary | ICD-10-CM | POA: Diagnosis not present

## 2022-02-18 DIAGNOSIS — Z510 Encounter for antineoplastic radiation therapy: Secondary | ICD-10-CM | POA: Diagnosis not present

## 2022-02-18 LAB — RAD ONC ARIA SESSION SUMMARY
Course Elapsed Days: 40
Plan Fractions Treated to Date: 23
Plan Fractions Treated to Date: 23
Plan Prescribed Dose Per Fraction: 1.8 Gy
Plan Prescribed Dose Per Fraction: 1.8 Gy
Plan Total Fractions Prescribed: 28
Plan Total Fractions Prescribed: 28
Plan Total Prescribed Dose: 50.4 Gy
Plan Total Prescribed Dose: 50.4 Gy
Reference Point Dosage Given to Date: 41.4 Gy
Reference Point Dosage Given to Date: 41.4 Gy
Reference Point Session Dosage Given: 1.8 Gy
Reference Point Session Dosage Given: 1.8 Gy
Session Number: 23

## 2022-02-19 ENCOUNTER — Ambulatory Visit: Payer: Medicaid Other

## 2022-02-19 ENCOUNTER — Ambulatory Visit: Payer: Medicaid Other | Admitting: Physical Therapy

## 2022-02-19 ENCOUNTER — Encounter: Payer: Self-pay | Admitting: Physical Therapy

## 2022-02-19 DIAGNOSIS — M546 Pain in thoracic spine: Secondary | ICD-10-CM

## 2022-02-19 DIAGNOSIS — M25611 Stiffness of right shoulder, not elsewhere classified: Secondary | ICD-10-CM | POA: Diagnosis not present

## 2022-02-19 DIAGNOSIS — M25612 Stiffness of left shoulder, not elsewhere classified: Secondary | ICD-10-CM

## 2022-02-19 NOTE — Therapy (Signed)
OUTPATIENT PHYSICAL THERAPY SHOULDER TREATMENT/DC Summary Reporting Period 12/21/21 - 02/19/22   Patient Name: Sharon Woods MRN: [id number] DOB:Apr 14, 1960, 61 y.o., female Today's Date: 02/19/2022   PT End of Session - 02/19/22 1032     Visit Number 10    Number of Visits 17    Date for PT Re-Evaluation 02/21/22    Authorization Type [place] Medicaid Healthy [employer]    Authorization Time Period Authorizaed for 12/23/21-03/23/22 for 13 viists; Certification for 3/64/68-05/31/10;    Authorization - Visit Number 10    Authorization - Number of Visits 13    Progress Note Due on Visit 10    PT Start Time 1006    PT Stop Time 1030    PT Time Calculation (min) 24 min    Activity Tolerance Patient tolerated treatment well;No increased pain    Behavior During Therapy WFL for tasks assessed/performed                    Past Medical History:  Diagnosis Date   Anxiety    Cancer ([place])    Depression    Diabetes mellitus without complication ([place])    History of high cholesterol 2000   Hyperlipidemia    Hypertension    Patient denies medical problems    Thrombocytopenia ([place])    Past Surgical History:  Procedure Laterality Date   ABDOMINAL HYSTERECTOMY     AXILLARY SENTINEL NODE BIOPSY Left 11/15/2021   Procedure: AXILLARY SENTINEL NODE BIOPSY;  Surgeon: [name], MD;  Location: [facility] ORS;  Service: General;  Laterality: Left;   BREAST BIOPSY Right 08/21/2021   [place] bx 7:00 mass coil clip path pending   BREAST BIOPSY Right 08/21/2021   [place] bx of LN, hydro marker, path pending   BREAST BIOPSY Left 08/21/2021   [place] bx, heart marker, path pending   CESAREAN SECTION     x2   MASTECTOMY MODIFIED RADICAL Right 11/15/2021   Procedure: MASTECTOMY MODIFIED RADICAL;  Surgeon: [name], MD;  Location: [facility] ORS;  Service: General;  Laterality: Right;   NO PAST SURGERIES     TOTAL MASTECTOMY Left 11/15/2021   Procedure: TOTAL MASTECTOMY;  Surgeon: [name], MD;   Location: [facility] ORS;  Service: General;  Laterality: Left;   Patient Active Problem List   Diagnosis Date Noted   Abnormal gastrointestinal PET scan 01/10/2022   Diabetes mellitus ([place]) 12/05/2021   Status post bilateral mastectomy 11/21/2021   Bilateral breast cancer ([place]) 11/15/2021   Genetic testing 10/07/2021   Vaginitis 09/28/2021   Thrombocytopenia ([place]) 09/08/2021   Malignant neoplasm of left breast ([place]) 08/28/2021   Malignant neoplasm of lower-outer quadrant of right breast of female, estrogen receptor positive ([place]) 08/28/2021   Goals of care, counseling/discussion 08/28/2021   Severe recurrent major depression with psychotic features ([place]) 11/15/2015   Hypertension 11/15/2015   Noncompliance 11/15/2015   Severe major depression, single episode, with psychotic features, mood-congruent ([place]) 03/29/2015   Protein-calorie malnutrition, severe 03/26/2015   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 Is having some difficulty with the radiation area of her armpit. Completing HEP without question. Patient reports 6/10 pain today  PERTINENT HISTORY: Pt is a 61 year old female presenting with bilat decreased shoulder ROM following double masectomy 11/15/21. Not currently under any cancer treatments, is seeking further treatments. No lymphedema, or treatment for this at this time. Reports since  surgery she has decreased overhead motion. She has bilat pain at the axilla of her Ues. Pain currently 5/10; lowest pain score 3/10; worst pain 9/10. The pain she feels is sharp and she also has numbness/tingling in the axilla across the top of bilat breasts. Pain is aggravated by reaching overhead, reaching behind her back, pulling, pushing... really anything I do with my arms. Laying on her back hurts as well. Pain is relieved by tylenol, pain medication , and ice. 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 Patient does not have a license or vehicle, relies on family and public transit. Pt denies N/V, B&B changes, unexplained weight fluctuation, saddle paresthesia, fever, night sweats, or unrelenting night pain at this time.  Pt poor historian, seems to not have understanding of current condition.  PMH gathered from chart review: 11/05/16 L simple mastectomy and R modified mastectomy following stage 2 breast cancer diagnosis with lymphnode involvement; recent cellulitis from drainage with course of antibiotics.   PAIN:  Are you having pain? Yes: NPRS scale: 4/10 Pain location: bilat axilla Pain description: tingling, sharp Aggravating factors: reaching overhead, reaching behind her back, pulling, pushing... really anything I do with my arms. Laying on her back hurts as well.  Relieving factors: tylenol, pain medication , and ice  PRECAUTIONS: None   OBJECTIVE:   UPPER EXTREMITY ROM:   Active /PROM at eval Right eval Left eval  Shoulder flexion 104/146 106/144  Shoulder extension WNL WNL  Shoulder abduction 92/106 119/135  Shoulder internal rotation (apleys) T12/WNL T12/WNL  Shoulder external rotation (apleys) C5/WNL CTJ/WNL  Elbow flexion WNL WNL  Elbow extension WNL WNL  (Blank rows = not tested)      TODAY'S TREATMENT: There.ex:  Nu Step L2 for 5 minutes seat 7 UE 11 for gentle protraction <>  retraction strengthening/movement Use of towel roll along thoracic spine to improve trunk rotation.     PT reviewed the following HEP with patient with patient able to demonstrate a set of the following with min cuing for correction needed. PT educated patient on parameters of therex (how/when to inc/decrease intensity, frequency, rep/set range, stretch hold time, and purpose of therex) with verbalized understanding.  Access Code: [email or web address] - Standing Low Trap Setting with Resistance at [place]  - 1 x daily - 2 x weekly - 3 sets - 8-12 reps - Shoulder External Rotation and Scapular Retraction with Resistance  - 1 x daily - 2 x weekly - 3 sets - 8-12 reps - Standing Shoulder Horizontal and Diagonal Pulls with Resistance  - 1 x daily - 2 x weekly - 3 sets - 8-12 reps - Seated Thoracic Lumbar Extension with Pectoralis Stretch  - 1-3 x daily - 7 x weekly - 12 reps - 2sec hold - Doorway Pec Stretch at 120 Degrees Abduction  - 1-3 x daily - 7 x weekly - 30-60sec hold       PATIENT EDUCATION: Education details: DC HEP recs  Person educated: Patient Education method: Explanation, Demonstration, and  Handouts Education comprehension: verbalized understanding, returned demonstration, and tactile cues required   HOME EXERCISE PROGRAM: Access Code: [email or web address]   ASSESSMENT:  CLINICAL IMPRESSION: PT reassessed goals this session where patient has met all goals to safely d/c formal PT. Patient is able to demonstrate and verbalize understanding of all HEP recommendations with minimal corrections needed. Pt given clinic contact info should further questions or concerns arise. Pt to d/c PT.     OBJECTIVE IMPAIRMENTS decreased activity tolerance, decreased coordination, decreased endurance, decreased mobility, decreased ROM, decreased strength, increased muscle spasms, impaired flexibility, impaired tone, impaired UE functional use, improper body mechanics, postural dysfunction, and pain.   ACTIVITY  LIMITATIONS carrying, lifting, sleeping, transfers, bathing, dressing, reach over head, and hygiene/grooming  PARTICIPATION LIMITATIONS: meal prep, cleaning, laundry, shopping, community activity, and occupation  PERSONAL FACTORS Age, Education, Fitness, Past/current experiences, Sex, Social background, Time since onset of injury/illness/exacerbation, Transportation, and 3+ comorbidities: A&D, DB, HTN, history of cancer  are also affecting patient's functional outcome.   REHAB POTENTIAL: Good  CLINICAL DECISION MAKING: Evolving/moderate complexity  EVALUATION COMPLEXITY: Moderate   GOALS: Goals reviewed with patient? Yes  SHORT TERM GOALS: Target date: 03/19/2022  (Remove Blue Hyperlink)  Pt will be independent with HEP in order to improve strength and balance in order to decrease fall risk and improve function at home and work. Baseline: 12/18/21 HEP given  Goal status: INITIAL    LONG TERM GOALS: Target date: 04/16/2022  (Remove Blue Hyperlink)  Pt will decrease worst pain as reported on NPRS by at least 3 points in order to demonstrate clinically significant reduction in pain.  Baseline: 12/21/21 9/10 02/19/22 6/10 Goal status: MET  2.  Patient will increase FOTO score to 61 to demonstrate predicted increase in functional mobility to complete ADLs  Baseline: 12/21/21 40; 02/20/23 74 Goal status: MET  3.  Pt will demonstrate 4+/5 gross periscapular MMT in order to complete heavy household ADLs Baseline: 12/21/21 Y lower trap 3+/4- ; T Scapular retractors 4-/4 02/20/22 Y lower trap 4+ bilat; T Scapular retractors 5 bilat Goal status: MET  4.  Pt will demonstrate full shoulder ROM in order to complete household and self care ADLs Baseline: R/L flex 104/106 abd: 92/119 ER: C5/WNL 02/20/22 R/L flex 1175/178 abd: full bilat ER: CTJ bilat Goal status: MET    PLAN: PT FREQUENCY: 2x/week  PT DURATION: 8 weeks  PLANNED INTERVENTIONS: Therapeutic exercises, Therapeutic activity,  Neuromuscular re-education, Balance training, Gait training, Patient/Family education, Self Care, Joint mobilization, Joint manipulation, DME instructions, Aquatic Therapy, Dry Needling, Electrical stimulation, Spinal manipulation, Spinal mobilization, Cryotherapy, Moist heat, Traction, Ultrasound, Manual therapy, and Re-evaluation  PLAN FOR NEXT SESSION: progress shoulder mobility and stregnthening.  Edison Nasuti A. Laurance Flatten, [place] DPT Physical Therapist- [facility]  02/19/2022, 10:33 AM

## 2022-02-24 ENCOUNTER — Ambulatory Visit: Payer: Medicaid Other

## 2022-02-24 ENCOUNTER — Ambulatory Visit: Admission: RE | Admit: 2022-02-24 | Payer: Medicaid Other | Source: Ambulatory Visit

## 2022-02-25 ENCOUNTER — Other Ambulatory Visit: Payer: Self-pay

## 2022-02-25 ENCOUNTER — Ambulatory Visit
Admission: RE | Admit: 2022-02-25 | Discharge: 2022-02-25 | Disposition: A | Discharge status: Home / Self Care | Payer: Medicaid Other | Source: Ambulatory Visit | Attending: Radiation Oncology | Admitting: Radiation Oncology

## 2022-02-25 ENCOUNTER — Ambulatory Visit: Payer: Medicaid Other

## 2022-02-25 DIAGNOSIS — C50412 Malignant neoplasm of upper-outer quadrant of left female breast: Secondary | ICD-10-CM | POA: Diagnosis not present

## 2022-02-25 DIAGNOSIS — Z510 Encounter for antineoplastic radiation therapy: Secondary | ICD-10-CM | POA: Diagnosis not present

## 2022-02-25 DIAGNOSIS — C50511 Malignant neoplasm of lower-outer quadrant of right female breast: Secondary | ICD-10-CM | POA: Diagnosis not present

## 2022-02-25 DIAGNOSIS — Z170 Estrogen receptor positive status [ER+]: Secondary | ICD-10-CM | POA: Diagnosis not present

## 2022-02-25 LAB — RAD ONC ARIA SESSION SUMMARY
Course Elapsed Days: 47
Plan Fractions Treated to Date: 24
Plan Fractions Treated to Date: 24
Plan Prescribed Dose Per Fraction: 1.8 Gy
Plan Prescribed Dose Per Fraction: 1.8 Gy
Plan Total Fractions Prescribed: 28
Plan Total Fractions Prescribed: 28
Plan Total Prescribed Dose: 50.4 Gy
Plan Total Prescribed Dose: 50.4 Gy
Reference Point Dosage Given to Date: 43.2 Gy
Reference Point Dosage Given to Date: 43.2 Gy
Reference Point Session Dosage Given: 1.8 Gy
Reference Point Session Dosage Given: 1.8 Gy
Session Number: 24

## 2022-02-26 ENCOUNTER — Encounter: Payer: Medicaid Other | Admitting: Physical Therapy

## 2022-02-26 ENCOUNTER — Ambulatory Visit: Payer: Medicaid Other

## 2022-02-26 ENCOUNTER — Other Ambulatory Visit: Payer: Self-pay

## 2022-02-26 ENCOUNTER — Ambulatory Visit
Admission: RE | Admit: 2022-02-26 | Discharge: 2022-02-26 | Disposition: A | Discharge status: Home / Self Care | Payer: Medicaid Other | Source: Ambulatory Visit | Attending: Radiation Oncology | Admitting: Radiation Oncology

## 2022-02-26 DIAGNOSIS — C50412 Malignant neoplasm of upper-outer quadrant of left female breast: Secondary | ICD-10-CM | POA: Diagnosis not present

## 2022-02-26 DIAGNOSIS — Z510 Encounter for antineoplastic radiation therapy: Secondary | ICD-10-CM | POA: Diagnosis not present

## 2022-02-26 DIAGNOSIS — C50511 Malignant neoplasm of lower-outer quadrant of right female breast: Secondary | ICD-10-CM | POA: Diagnosis not present

## 2022-02-26 DIAGNOSIS — Z170 Estrogen receptor positive status [ER+]: Secondary | ICD-10-CM | POA: Diagnosis not present

## 2022-02-26 LAB — RAD ONC ARIA SESSION SUMMARY
Course Elapsed Days: 48
Plan Fractions Treated to Date: 25
Plan Fractions Treated to Date: 25
Plan Prescribed Dose Per Fraction: 1.8 Gy
Plan Prescribed Dose Per Fraction: 1.8 Gy
Plan Total Fractions Prescribed: 28
Plan Total Fractions Prescribed: 28
Plan Total Prescribed Dose: 50.4 Gy
Plan Total Prescribed Dose: 50.4 Gy
Reference Point Dosage Given to Date: 45 Gy
Reference Point Dosage Given to Date: 45 Gy
Reference Point Session Dosage Given: 1.8 Gy
Reference Point Session Dosage Given: 1.8 Gy
Session Number: 25

## 2022-02-27 ENCOUNTER — Ambulatory Visit: Payer: Medicaid Other

## 2022-02-27 ENCOUNTER — Other Ambulatory Visit: Payer: Self-pay

## 2022-02-27 ENCOUNTER — Inpatient Hospital Stay: Payer: Medicaid Other

## 2022-02-27 ENCOUNTER — Ambulatory Visit
Admission: RE | Admit: 2022-02-27 | Discharge: 2022-02-27 | Disposition: A | Discharge status: Home / Self Care | Payer: Medicaid Other | Source: Ambulatory Visit | Attending: Radiation Oncology | Admitting: Radiation Oncology

## 2022-02-27 DIAGNOSIS — C50412 Malignant neoplasm of upper-outer quadrant of left female breast: Secondary | ICD-10-CM | POA: Diagnosis not present

## 2022-02-27 DIAGNOSIS — Z510 Encounter for antineoplastic radiation therapy: Secondary | ICD-10-CM | POA: Diagnosis not present

## 2022-02-27 DIAGNOSIS — C50511 Malignant neoplasm of lower-outer quadrant of right female breast: Secondary | ICD-10-CM | POA: Diagnosis not present

## 2022-02-27 DIAGNOSIS — Z170 Estrogen receptor positive status [ER+]: Secondary | ICD-10-CM | POA: Diagnosis not present

## 2022-02-27 LAB — RAD ONC ARIA SESSION SUMMARY
Course Elapsed Days: 49
Plan Fractions Treated to Date: 26
Plan Fractions Treated to Date: 26
Plan Prescribed Dose Per Fraction: 1.8 Gy
Plan Prescribed Dose Per Fraction: 1.8 Gy
Plan Total Fractions Prescribed: 28
Plan Total Fractions Prescribed: 28
Plan Total Prescribed Dose: 50.4 Gy
Plan Total Prescribed Dose: 50.4 Gy
Reference Point Dosage Given to Date: 46.8 Gy
Reference Point Dosage Given to Date: 46.8 Gy
Reference Point Session Dosage Given: 1.8 Gy
Reference Point Session Dosage Given: 1.8 Gy
Session Number: 26

## 2022-02-28 ENCOUNTER — Ambulatory Visit
Admission: RE | Admit: 2022-02-28 | Discharge: 2022-02-28 | Disposition: A | Discharge status: Home / Self Care | Payer: Medicaid Other | Source: Ambulatory Visit | Attending: Radiation Oncology | Admitting: Radiation Oncology

## 2022-02-28 ENCOUNTER — Ambulatory Visit: Payer: Medicaid Other

## 2022-02-28 ENCOUNTER — Inpatient Hospital Stay: Payer: Medicaid Other

## 2022-02-28 ENCOUNTER — Other Ambulatory Visit: Payer: Self-pay

## 2022-02-28 DIAGNOSIS — R948 Abnormal results of function studies of other organs and systems: Secondary | ICD-10-CM | POA: Insufficient documentation

## 2022-02-28 DIAGNOSIS — F323 Major depressive disorder, single episode, severe with psychotic features: Secondary | ICD-10-CM | POA: Insufficient documentation

## 2022-02-28 DIAGNOSIS — Z510 Encounter for antineoplastic radiation therapy: Secondary | ICD-10-CM | POA: Diagnosis not present

## 2022-02-28 DIAGNOSIS — C50412 Malignant neoplasm of upper-outer quadrant of left female breast: Secondary | ICD-10-CM | POA: Insufficient documentation

## 2022-02-28 DIAGNOSIS — C50511 Malignant neoplasm of lower-outer quadrant of right female breast: Secondary | ICD-10-CM | POA: Insufficient documentation

## 2022-02-28 DIAGNOSIS — E1169 Type 2 diabetes mellitus with other specified complication: Secondary | ICD-10-CM | POA: Diagnosis present

## 2022-02-28 DIAGNOSIS — Z170 Estrogen receptor positive status [ER+]: Secondary | ICD-10-CM

## 2022-02-28 DIAGNOSIS — D696 Thrombocytopenia, unspecified: Secondary | ICD-10-CM | POA: Diagnosis present

## 2022-02-28 DIAGNOSIS — C50011 Malignant neoplasm of nipple and areola, right female breast: Secondary | ICD-10-CM | POA: Insufficient documentation

## 2022-02-28 LAB — RAD ONC ARIA SESSION SUMMARY
Course Elapsed Days: 50
Plan Fractions Treated to Date: 27
Plan Fractions Treated to Date: 27
Plan Prescribed Dose Per Fraction: 1.8 Gy
Plan Prescribed Dose Per Fraction: 1.8 Gy
Plan Total Fractions Prescribed: 28
Plan Total Fractions Prescribed: 28
Plan Total Prescribed Dose: 50.4 Gy
Plan Total Prescribed Dose: 50.4 Gy
Reference Point Dosage Given to Date: 48.6 Gy
Reference Point Dosage Given to Date: 48.6 Gy
Reference Point Session Dosage Given: 1.8 Gy
Reference Point Session Dosage Given: 1.8 Gy
Session Number: 27

## 2022-02-28 LAB — CBC
HCT: 40.9 % (ref 36.0–46.0)
Hemoglobin: 13.7 g/dL (ref 12.0–15.0)
MCH: 29.7 pg (ref 26.0–34.0)
MCHC: 33.5 g/dL (ref 30.0–36.0)
MCV: 88.5 fL (ref 80.0–100.0)
Platelets: 58 10*3/uL — ABNORMAL LOW (ref 150–400)
RBC: 4.62 MIL/uL (ref 3.87–5.11)
RDW: 13.9 % (ref 11.5–15.5)
WBC: 3.1 10*3/uL — ABNORMAL LOW (ref 4.0–10.5)
nRBC: 0 % (ref 0.0–0.2)

## 2022-03-03 ENCOUNTER — Ambulatory Visit: Payer: Medicaid Other

## 2022-03-03 ENCOUNTER — Other Ambulatory Visit: Payer: Self-pay

## 2022-03-03 ENCOUNTER — Ambulatory Visit
Admission: RE | Admit: 2022-03-03 | Discharge: 2022-03-03 | Disposition: A | Discharge status: Home / Self Care | Payer: Medicaid Other | Source: Ambulatory Visit | Attending: Radiation Oncology | Admitting: Radiation Oncology

## 2022-03-03 DIAGNOSIS — C50511 Malignant neoplasm of lower-outer quadrant of right female breast: Secondary | ICD-10-CM | POA: Diagnosis not present

## 2022-03-03 DIAGNOSIS — Z510 Encounter for antineoplastic radiation therapy: Secondary | ICD-10-CM | POA: Diagnosis not present

## 2022-03-03 DIAGNOSIS — Z170 Estrogen receptor positive status [ER+]: Secondary | ICD-10-CM | POA: Diagnosis not present

## 2022-03-03 DIAGNOSIS — C50412 Malignant neoplasm of upper-outer quadrant of left female breast: Secondary | ICD-10-CM | POA: Diagnosis not present

## 2022-03-03 LAB — RAD ONC ARIA SESSION SUMMARY
Course Elapsed Days: 53
Plan Fractions Treated to Date: 28
Plan Fractions Treated to Date: 28
Plan Prescribed Dose Per Fraction: 1.8 Gy
Plan Prescribed Dose Per Fraction: 1.8 Gy
Plan Total Fractions Prescribed: 28
Plan Total Fractions Prescribed: 28
Plan Total Prescribed Dose: 50.4 Gy
Plan Total Prescribed Dose: 50.4 Gy
Reference Point Dosage Given to Date: 50.4 Gy
Reference Point Dosage Given to Date: 50.4 Gy
Reference Point Session Dosage Given: 1.8 Gy
Reference Point Session Dosage Given: 1.8 Gy
Session Number: 28

## 2022-03-03 NOTE — Progress Notes (Unsigned)
Established Patient Office Visit  Subjective    Patient ID: Sharon Woods, female    DOB: [birth year]  Age: 61 y.o. MRN: [id number]  CC:  No chief complaint on file.   HPI Sharon Woods presents to establish care.  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 Per chart review patient is not a candidate for adjuvant IV chemotherapy but is following with radiation oncology for advanced radiation with plan for endocrine therapy after she finishes radiation therapy. Last treatment 03/20/22 -Family history of mother, father and sister with breast cancer  Diabetes, Type 2: -Last A1c 5.2, 10/23 -Medications: Metformin 500 mg, glipizide 5 mg discontinued at [place]  -Patient is compliant with the above medications and reports no side effects.  -Checking BG at home: No cannot afford test strips -Eye exam: We will discuss at follow-up -Foot exam: Due at follow-up -Microalbumin: UTD 10/23 -Statin: No -PNA vaccine: Discuss at follow-up -Denies symptoms of hypoglycemia, polyuria, polydipsia, numbness extremities, foot ulcers/trauma.   History of questionable hypertension: -Medications: Nothing -Blood pressure here well controlled  HLD: -Medications: Nothing currently -Last lipid panel: Lipid Panel     Component Value Date/Time   CHOL 255 (H) 04/16/2015 0659   TRIG 236 (H) 04/16/2015 0659   HDL 69 04/16/2015 0659   CHOLHDL 3.7 04/16/2015 0659   VLDL 47 (H) 04/16/2015 0659   LDLCALC 139 (H) 04/16/2015 7353    MDD: -Mood status: uncontrolled -Current treatment: Started on Lexapro 5 mg at [facility] -Previous psychiatric medications: prozac, seroquel, and zyprexa -patient states that she has been on medications for depression in the past, however her "husband did not want her to be on them" and she felt like a "zombie" so she discontinued them however  she also states that she is under a lot of stress both at home and with her health Depressed mood: yes     01/20/2022    2:14 PM 10/10/2021    3:06 PM 10/10/2021    3:04 PM 10/10/2021    3:03 PM  Depression screen PHQ 2/9  Decreased Interest 0 1 0 1  Down, Depressed, Hopeless 1 0 0 1  PHQ - 2 Score 1 1 0 2  Altered sleeping 2 0  0  Tired, decreased energy _0 Change in appetite 2 1  0  Feeling bad or failure about yourself  0 0  0  Trouble concentrating 0 0  1  Moving slowly or fidgety/restless 0 1  0  Suicidal thoughts 0 0  0  PHQ-9 Score _1 Difficult doing work/chores Somewhat difficult Somewhat difficult  Somewhat difficult   Vaginal irritation: -Patient states she is having some vaginal discomfort more on the outside -Patient complaining of bilateral flank pain but denies dysuria, hematuria or increased urinary urgency or frequency.  Also denies changes in vaginal discharge.  Health maintenance: -Blood work up-to-date -Colon cancer screening due  Outpatient Encounter Medications as of 03/04/2022  Medication Sig   clotrimazole (LOTRIMIN) 1 % cream APPLY TOPICALLY TWO TIMES DAILY   ACCU-CHEK GUIDE test strip USE TO CHECK BLOOD SUGAR UP TO 4 TIMES DAILY AS DIRECTED   Accu-Chek Softclix Lancets lancets USE TO CHECK BLOOD SUAGR UP TO 4 TIMES DAILY AS DIRECTED   blood glucose meter kit and supplies KIT Dispense based on patient and insurance preference. Use up to four  times daily as directed.   escitalopram (LEXAPRO) 5 MG tablet Take 1 tablet (5 mg total) by mouth daily.   glipiZIDE (GLUCOTROL) 5 MG tablet Take 1 tablet (5 mg total) by mouth daily before breakfast.   metFORMIN (GLUCOPHAGE) 500 MG tablet Take 1 tablet (500 mg total) by mouth daily.   oxyCODONE (OXY IR/ROXICODONE) 5 MG immediate release tablet Take 1 tablet (5 mg total) by mouth every 6 (six) hours as needed for up to 24 doses for severe pain or breakthrough pain.   No facility-administered encounter  medications on file as of 03/04/2022.    Past Medical History:  Diagnosis Date   Anxiety    Cancer ([place])    Depression    Diabetes mellitus without complication ([place])    History of high cholesterol 2000   Hyperlipidemia    Hypertension    Patient denies medical problems    Thrombocytopenia ([place])     Past Surgical History:  Procedure Laterality Date   ABDOMINAL HYSTERECTOMY     AXILLARY SENTINEL NODE BIOPSY Left 11/15/2021   Procedure: AXILLARY SENTINEL NODE BIOPSY;  Surgeon: [name], MD;  Location: [facility] ORS;  Service: General;  Laterality: Left;   BREAST BIOPSY Right 08/21/2021   [place] bx 7:00 mass coil clip path pending   BREAST BIOPSY Right 08/21/2021   [place] bx of LN, hydro marker, path pending   BREAST BIOPSY Left 08/21/2021   [place] bx, heart marker, path pending   CESAREAN SECTION     x2   MASTECTOMY MODIFIED RADICAL Right 11/15/2021   Procedure: MASTECTOMY MODIFIED RADICAL;  Surgeon: [name], MD;  Location: [facility] ORS;  Service: General;  Laterality: Right;   NO PAST SURGERIES     TOTAL MASTECTOMY Left 11/15/2021   Procedure: TOTAL MASTECTOMY;  Surgeon: [name], MD;  Location: [facility] ORS;  Service: General;  Laterality: Left;    Family History  Problem Relation Age of Onset   Lung cancer Mother    Dementia Mother    Parkinson's disease Father    Cancer Father        unk type   Diabetes Sister    Heart attack Sister    Breast cancer Sister    Cancer Maternal Uncle        unk types   Dementia Maternal Grandmother    Cancer Maternal Grandmother        unk type   Cancer Other    Dementia Other     Social History   Socioeconomic History   Marital status: Married    Spouse name: Not on file   Number of children: Not on file   Years of education: Not on file   Highest education level: Not on file  Occupational History   Not on file  Tobacco Use   Smoking status: Former    Types: Cigarettes    Quit date: 2013    Years since quitting: 10.9     Passive exposure: Current   Smokeless tobacco: Never  Vaping Use   Vaping Use: Never used  Substance and Sexual Activity   Alcohol use: Not Currently    Comment: occasionally   Drug use: No   Sexual activity: Not Currently    Comment: unable to assess   Other Topics Concern   Not on file  Social History Narrative   Not on file   Social Determinants of Health   Financial Resource Strain: Medium Risk (10/10/2021)   Overall Financial Resource Strain (CARDIA)  Difficulty of Paying Living Expenses: Somewhat hard  Food Insecurity: No Food Insecurity (10/10/2021)   Hunger Vital Sign    Worried About Running Out of Food in the Last Year: Never true    Ran Out of Food in the Last Year: Never true  Transportation Needs: Unmet Transportation Needs (01/21/2022)   PRAPARE - Transportation    Lack of Transportation (Medical): Yes    Lack of Transportation (Non-Medical): Yes  Physical Activity: Inactive (10/10/2021)   Exercise Vital Sign    Days of Exercise per Week: 0 days    Minutes of Exercise per Session: 0 min  Stress: Stress Concern Present (10/10/2021)   [place]    Feeling of Stress : To some extent  Social Connections: Socially Isolated (10/10/2021)   Social Connection and Isolation Panel [NHANES]    Frequency of Communication with Friends and Family: Once a week    Frequency of Social Gatherings with Friends and Family: Never    Attends Religious Services: Never    Marine scientist or Organizations: No    Attends Archivist Meetings: Never    Marital Status: Married  Human resources officer Violence: Not on file    Review of Systems  Constitutional:  Negative for chills and fever.  Eyes:  Negative for blurred vision.  Respiratory:  Negative for shortness of breath.   Cardiovascular:  Negative for chest pain.  Genitourinary:  Positive for flank pain. Negative for dysuria, frequency, hematuria  and urgency.  Psychiatric/Behavioral:  Positive for depression.       Objective    LMP  (LMP Unknown) Comment: Patient is not a good historian  Physical Exam Constitutional:      Appearance: Normal appearance.     Comments: Poor historian   HENT:     Head: Normocephalic and atraumatic.  Eyes:     Conjunctiva/sclera: Conjunctivae normal.  Cardiovascular:     Rate and Rhythm: Normal rate and regular rhythm.  Pulmonary:     Effort: Pulmonary effort is normal.     Breath sounds: Normal breath sounds.  Skin:    General: Skin is warm and dry.  Neurological:     General: No focal deficit present.     Mental Status: She is alert. Mental status is at baseline.  Psychiatric:        Mood and Affect: Mood normal.         Assessment & Plan:   1. Type 2 diabetes mellitus with other specified complication, without long-term current use of insulin ([place]): Patient's last A1c 5.2%.  Patient states she is lost weight and has been working on her diet and only buying sugar-free snacks.  We will discontinue glipizide for now and continue metformin 500 mg once daily, this will be sent to her pharmacy.  Urine microalbumin today as well.  We will need to do foot exam at follow-up.  - metFORMIN (GLUCOPHAGE) 500 MG tablet; Take 1 tablet (500 mg total) by mouth daily.  Dispense: 90 tablet; Refill: 1 - Urine Microalbumin w/creat. ratio  2. Moderate episode of recurrent major depressive disorder [facility]): Patient states she is easily agitated and dealing with depression symptoms at home, however she has had side effects with other mental health medications in the past.  Patient is willing to try Lexapro 5 mg with plans to follow-up in 6 weeks.  - escitalopram (LEXAPRO) 5 MG tablet; Take 1 tablet (5 mg total) by mouth daily.  Dispense: 30  tablet; Refill: 1  3. Vaginal discomfort: Urine sample showing blood and leukocytes, will send for urine culture.  It sounds like she is having some vulvar discomfort  and itching, will send cream to pharmacy for this as well.  - POCT Urinalysis Dipstick - Urine Culture - clotrimazole (LOTRIMIN) 1 % cream; Apply 1 Application topically 2 (two) times daily.  Dispense: 30 g; Refill: 0  4. Hyperlipidemia, unspecified hyperlipidemia type: Last cholesterol checked in 2017, plan to recheck at follow-up or when other labs are due.  5. Bilateral malignant neoplasm of breast in female, unspecified estrogen receptor status, unspecified site of breast [facility]): Following with oncology, note from 01/10/2022 reviewed.  Patient is currently undergoing radiation therapy and is s/p bilateral mastectomy in August.  6. Assistance with transportation: Referral to social work placed to help with transportation needs, patient does not have her driver's license and cannot drive.  - AMB Referral to [facility]  No follow-ups on file.   Teodora Medici, DO

## 2022-03-04 ENCOUNTER — Ambulatory Visit
Admission: RE | Admit: 2022-03-04 | Discharge: 2022-03-04 | Disposition: A | Discharge status: Home / Self Care | Payer: Medicaid Other | Source: Ambulatory Visit | Attending: Radiation Oncology | Admitting: Radiation Oncology

## 2022-03-04 ENCOUNTER — Ambulatory Visit: Payer: Medicaid Other

## 2022-03-04 ENCOUNTER — Encounter: Payer: Self-pay | Admitting: Internal Medicine

## 2022-03-04 ENCOUNTER — Other Ambulatory Visit: Payer: Self-pay

## 2022-03-04 ENCOUNTER — Ambulatory Visit (INDEPENDENT_AMBULATORY_CARE_PROVIDER_SITE_OTHER): Payer: Medicaid Other | Admitting: Internal Medicine

## 2022-03-04 VITALS — BP 136/84 | HR 73 | Temp 98.3°F | Resp 16 | Ht 61.0 in | Wt 161.8 lb

## 2022-03-04 DIAGNOSIS — Z23 Encounter for immunization: Secondary | ICD-10-CM

## 2022-03-04 DIAGNOSIS — F331 Major depressive disorder, recurrent, moderate: Secondary | ICD-10-CM

## 2022-03-04 DIAGNOSIS — E1165 Type 2 diabetes mellitus with hyperglycemia: Secondary | ICD-10-CM | POA: Diagnosis not present

## 2022-03-04 DIAGNOSIS — C50412 Malignant neoplasm of upper-outer quadrant of left female breast: Secondary | ICD-10-CM | POA: Diagnosis not present

## 2022-03-04 LAB — RAD ONC ARIA SESSION SUMMARY
Course Elapsed Days: 54
Plan Fractions Treated to Date: 1
Plan Fractions Treated to Date: 1
Plan Prescribed Dose Per Fraction: 2 Gy
Plan Prescribed Dose Per Fraction: 2 Gy
Plan Total Fractions Prescribed: 5
Plan Total Fractions Prescribed: 5
Plan Total Prescribed Dose: 10 Gy
Plan Total Prescribed Dose: 10 Gy
Reference Point Dosage Given to Date: 2 Gy
Reference Point Dosage Given to Date: 2 Gy
Reference Point Session Dosage Given: 2 Gy
Reference Point Session Dosage Given: 2 Gy
Session Number: 29

## 2022-03-04 MED ORDER — ESCITALOPRAM OXALATE 10 MG PO TABS: 10.0000 mg | ORAL_TABLET | Freq: Every day | ORAL | 1 refills | Status: DC

## 2022-03-04 NOTE — Patient Instructions (Addendum)
It was great seeing you today!  Plan discussed at today's visit: -Increase Lexapro 10 mg daily  -Flu vaccine today   Follow up in: 6 weeks   Take care and let us know if you have any questions or concerns prior to your next visit.  Dr. Rosana Berger

## 2022-03-05 ENCOUNTER — Ambulatory Visit: Payer: Medicaid Other

## 2022-03-05 ENCOUNTER — Encounter: Payer: Medicaid Other | Admitting: Physical Therapy

## 2022-03-05 ENCOUNTER — Ambulatory Visit
Admission: RE | Admit: 2022-03-05 | Discharge: 2022-03-05 | Disposition: A | Discharge status: Home / Self Care | Payer: Medicaid Other | Source: Ambulatory Visit | Attending: Radiation Oncology | Admitting: Radiation Oncology

## 2022-03-05 ENCOUNTER — Other Ambulatory Visit: Payer: Self-pay

## 2022-03-05 DIAGNOSIS — C50412 Malignant neoplasm of upper-outer quadrant of left female breast: Secondary | ICD-10-CM | POA: Diagnosis not present

## 2022-03-05 DIAGNOSIS — C50511 Malignant neoplasm of lower-outer quadrant of right female breast: Secondary | ICD-10-CM | POA: Diagnosis not present

## 2022-03-05 DIAGNOSIS — Z170 Estrogen receptor positive status [ER+]: Secondary | ICD-10-CM | POA: Diagnosis not present

## 2022-03-05 DIAGNOSIS — Z510 Encounter for antineoplastic radiation therapy: Secondary | ICD-10-CM | POA: Diagnosis not present

## 2022-03-05 LAB — RAD ONC ARIA SESSION SUMMARY
Course Elapsed Days: 55
Plan Fractions Treated to Date: 2
Plan Fractions Treated to Date: 2
Plan Prescribed Dose Per Fraction: 2 Gy
Plan Prescribed Dose Per Fraction: 2 Gy
Plan Total Fractions Prescribed: 5
Plan Total Fractions Prescribed: 5
Plan Total Prescribed Dose: 10 Gy
Plan Total Prescribed Dose: 10 Gy
Reference Point Dosage Given to Date: 4 Gy
Reference Point Dosage Given to Date: 4 Gy
Reference Point Session Dosage Given: 2 Gy
Reference Point Session Dosage Given: 2 Gy
Session Number: 30

## 2022-03-06 ENCOUNTER — Ambulatory Visit
Admission: RE | Admit: 2022-03-06 | Discharge: 2022-03-06 | Disposition: A | Discharge status: Home / Self Care | Payer: Medicaid Other | Source: Ambulatory Visit | Attending: Radiation Oncology | Admitting: Radiation Oncology

## 2022-03-06 ENCOUNTER — Other Ambulatory Visit: Payer: Self-pay

## 2022-03-06 ENCOUNTER — Ambulatory Visit: Payer: Medicaid Other

## 2022-03-06 DIAGNOSIS — C50412 Malignant neoplasm of upper-outer quadrant of left female breast: Secondary | ICD-10-CM | POA: Diagnosis not present

## 2022-03-06 LAB — RAD ONC ARIA SESSION SUMMARY
Course Elapsed Days: 56
Plan Fractions Treated to Date: 3
Plan Fractions Treated to Date: 3
Plan Prescribed Dose Per Fraction: 2 Gy
Plan Prescribed Dose Per Fraction: 2 Gy
Plan Total Fractions Prescribed: 5
Plan Total Fractions Prescribed: 5
Plan Total Prescribed Dose: 10 Gy
Plan Total Prescribed Dose: 10 Gy
Reference Point Dosage Given to Date: 6 Gy
Reference Point Dosage Given to Date: 6 Gy
Reference Point Session Dosage Given: 2 Gy
Reference Point Session Dosage Given: 2 Gy
Session Number: 31

## 2022-03-07 ENCOUNTER — Ambulatory Visit: Payer: Medicaid Other

## 2022-03-07 ENCOUNTER — Ambulatory Visit
Admission: RE | Admit: 2022-03-07 | Discharge: 2022-03-07 | Disposition: A | Discharge status: Home / Self Care | Payer: Medicaid Other | Source: Ambulatory Visit | Attending: Radiation Oncology | Admitting: Radiation Oncology

## 2022-03-07 ENCOUNTER — Other Ambulatory Visit: Payer: Self-pay

## 2022-03-07 DIAGNOSIS — C50412 Malignant neoplasm of upper-outer quadrant of left female breast: Secondary | ICD-10-CM | POA: Diagnosis not present

## 2022-03-07 LAB — RAD ONC ARIA SESSION SUMMARY
Course Elapsed Days: 57
Plan Fractions Treated to Date: 4
Plan Fractions Treated to Date: 4
Plan Prescribed Dose Per Fraction: 2 Gy
Plan Prescribed Dose Per Fraction: 2 Gy
Plan Total Fractions Prescribed: 5
Plan Total Fractions Prescribed: 5
Plan Total Prescribed Dose: 10 Gy
Plan Total Prescribed Dose: 10 Gy
Reference Point Dosage Given to Date: 8 Gy
Reference Point Dosage Given to Date: 8 Gy
Reference Point Session Dosage Given: 2 Gy
Reference Point Session Dosage Given: 2 Gy
Session Number: 32

## 2022-03-10 ENCOUNTER — Ambulatory Visit
Admission: RE | Admit: 2022-03-10 | Discharge: 2022-03-10 | Disposition: A | Discharge status: Home / Self Care | Payer: Medicaid Other | Source: Ambulatory Visit | Attending: Radiation Oncology | Admitting: Radiation Oncology

## 2022-03-10 ENCOUNTER — Encounter: Payer: Self-pay | Admitting: *Deleted

## 2022-03-10 ENCOUNTER — Ambulatory Visit: Payer: Medicaid Other

## 2022-03-10 ENCOUNTER — Other Ambulatory Visit: Payer: Self-pay

## 2022-03-10 DIAGNOSIS — C50511 Malignant neoplasm of lower-outer quadrant of right female breast: Secondary | ICD-10-CM | POA: Diagnosis not present

## 2022-03-10 DIAGNOSIS — Z170 Estrogen receptor positive status [ER+]: Secondary | ICD-10-CM | POA: Diagnosis not present

## 2022-03-10 DIAGNOSIS — Z510 Encounter for antineoplastic radiation therapy: Secondary | ICD-10-CM | POA: Diagnosis not present

## 2022-03-10 DIAGNOSIS — C50412 Malignant neoplasm of upper-outer quadrant of left female breast: Secondary | ICD-10-CM | POA: Diagnosis not present

## 2022-03-10 LAB — RAD ONC ARIA SESSION SUMMARY
Course Elapsed Days: 60
Plan Fractions Treated to Date: 5
Plan Fractions Treated to Date: 5
Plan Prescribed Dose Per Fraction: 2 Gy
Plan Prescribed Dose Per Fraction: 2 Gy
Plan Total Fractions Prescribed: 5
Plan Total Fractions Prescribed: 5
Plan Total Prescribed Dose: 10 Gy
Plan Total Prescribed Dose: 10 Gy
Reference Point Dosage Given to Date: 10 Gy
Reference Point Dosage Given to Date: 10 Gy
Reference Point Session Dosage Given: 2 Gy
Reference Point Session Dosage Given: 2 Gy
Session Number: 33

## 2022-03-11 ENCOUNTER — Ambulatory Visit: Payer: Medicaid Other

## 2022-03-12 ENCOUNTER — Ambulatory Visit: Payer: Medicaid Other

## 2022-03-12 ENCOUNTER — Encounter: Payer: Medicaid Other | Admitting: Physical Therapy

## 2022-03-13 ENCOUNTER — Ambulatory Visit: Payer: Medicaid Other | Admitting: Oncology

## 2022-03-13 ENCOUNTER — Other Ambulatory Visit: Payer: Medicaid Other

## 2022-03-13 ENCOUNTER — Ambulatory Visit: Payer: Medicaid Other

## 2022-03-13 DIAGNOSIS — K921 Melena: Secondary | ICD-10-CM | POA: Insufficient documentation

## 2022-03-13 NOTE — Assessment & Plan Note (Signed)
She has double mastectomy. She is doing for radiation therapy

## 2022-03-13 NOTE — Assessment & Plan Note (Signed)
Patient complains of blood in stool. Will refer her to the GI

## 2022-03-13 NOTE — Assessment & Plan Note (Signed)
Encouraged her to consume variety of food including fruits, vegetables, whole grains, complex carbohydrates and proteins.  Continue metformin and glipizide. Her hemoglobin A1c 5.4 on 02/13/2022

## 2022-03-14 ENCOUNTER — Ambulatory Visit: Payer: Medicaid Other

## 2022-03-19 ENCOUNTER — Inpatient Hospital Stay (HOSPITAL_BASED_OUTPATIENT_CLINIC_OR_DEPARTMENT_OTHER): Payer: Medicaid Other | Admitting: Oncology

## 2022-03-19 ENCOUNTER — Inpatient Hospital Stay: Payer: Medicaid Other

## 2022-03-19 ENCOUNTER — Encounter: Payer: Self-pay | Admitting: *Deleted

## 2022-03-19 ENCOUNTER — Encounter: Payer: Self-pay | Admitting: Oncology

## 2022-03-19 VITALS — BP 142/95 | HR 64 | Temp 97.9°F | Resp 18 | Wt 161.7 lb

## 2022-03-19 DIAGNOSIS — F323 Major depressive disorder, single episode, severe with psychotic features: Secondary | ICD-10-CM

## 2022-03-19 DIAGNOSIS — C50412 Malignant neoplasm of upper-outer quadrant of left female breast: Secondary | ICD-10-CM | POA: Diagnosis not present

## 2022-03-19 DIAGNOSIS — D696 Thrombocytopenia, unspecified: Secondary | ICD-10-CM | POA: Diagnosis not present

## 2022-03-19 DIAGNOSIS — E1169 Type 2 diabetes mellitus with other specified complication: Secondary | ICD-10-CM

## 2022-03-19 DIAGNOSIS — C50511 Malignant neoplasm of lower-outer quadrant of right female breast: Secondary | ICD-10-CM

## 2022-03-19 DIAGNOSIS — Z170 Estrogen receptor positive status [ER+]: Secondary | ICD-10-CM

## 2022-03-19 DIAGNOSIS — R948 Abnormal results of function studies of other organs and systems: Secondary | ICD-10-CM

## 2022-03-19 LAB — COMPREHENSIVE METABOLIC PANEL
ALT: 16 U/L (ref 0–44)
AST: 25 U/L (ref 15–41)
Albumin: 3.9 g/dL (ref 3.5–5.0)
Alkaline Phosphatase: 64 U/L (ref 38–126)
Anion gap: 6 (ref 5–15)
BUN: 12 mg/dL (ref 6–20)
CO2: 28 mmol/L (ref 22–32)
Calcium: 9 mg/dL (ref 8.9–10.3)
Chloride: 107 mmol/L (ref 98–111)
Creatinine, Ser: 0.61 mg/dL (ref 0.44–1.00)
GFR, Estimated: >60 mL/min (ref >60)
Glucose, Bld: 144 mg/dL — ABNORMAL HIGH (ref 70–99)
Potassium: 3.8 mmol/L (ref 3.5–5.1)
Sodium: 141 mmol/L (ref 135–145)
Total Bilirubin: 0.5 mg/dL (ref 0.3–1.2)
Total Protein: 7.3 g/dL (ref 6.5–8.1)

## 2022-03-19 LAB — CBC WITH DIFFERENTIAL/PLATELET
Abs Immature Granulocytes: 0 10*3/uL (ref 0.00–0.07)
Basophils Absolute: 0 10*3/uL (ref 0.0–0.1)
Basophils Relative: 1 %
Eosinophils Absolute: 0 10*3/uL (ref 0.0–0.5)
Eosinophils Relative: 2 %
HCT: 39.8 % (ref 36.0–46.0)
Hemoglobin: 13 g/dL (ref 12.0–15.0)
Immature Granulocytes: 0 %
Lymphocytes Relative: 18 %
Lymphs Abs: 0.4 10*3/uL — ABNORMAL LOW (ref 0.7–4.0)
MCH: 29.2 pg (ref 26.0–34.0)
MCHC: 32.7 g/dL (ref 30.0–36.0)
MCV: 89.4 fL (ref 80.0–100.0)
Monocytes Absolute: 0.2 10*3/uL (ref 0.1–1.0)
Monocytes Relative: 7 %
Neutro Abs: 1.7 10*3/uL (ref 1.7–7.7)
Neutrophils Relative %: 72 %
Platelets: 62 10*3/uL — ABNORMAL LOW (ref 150–400)
RBC: 4.45 MIL/uL (ref 3.87–5.11)
RDW: 14.1 % (ref 11.5–15.5)
WBC: 2.3 10*3/uL — ABNORMAL LOW (ref 4.0–10.5)
nRBC: 0 % (ref 0.0–0.2)

## 2022-03-19 MED ORDER — ANASTROZOLE 1 MG PO TABS: 1.0000 mg | ORAL_TABLET | Freq: Every day | ORAL | 3 refills | Status: DC

## 2022-03-19 NOTE — Assessment & Plan Note (Signed)
Bilateral breast invasive lobular carcinoma, s/p mastectomy.   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  pT2 pN1a, ER +90%, PR +90%, HER2 negative (1+)  # Right Breast invasive lobular carcinoma Grade 2, negative margins, right axillary lymph nodes  30/32 involved with metastatic carcinoma, pT1c pN3a ER +90%, PR +51-90%, HER2 negative (1+)  [name]  Rationale of using aromatase inhibitor -Arimidex  discussed with patient.  Side effects of Arimidex including but not limited to hot flush, joint pain, fatigue, mood swing, osteoporosis discussed with patient. Patient and  understanding and willing to proceed.    [name]

## 2022-03-19 NOTE — Progress Notes (Deleted)
[facility] POST-OP OFFICE VISIT  03/19/2022  HPI: Sharon Woods is a 61 y.o. female status post bilateral mastectomies for advanced bilateral disease, right worse than left.  She complains of some dermal itching where the Steri-Strips remain.  They are ready to be removed and remove them today.  She denies any fevers or chills.  She does complain of a little pooching under her left axilla.  And she does have a bit of a seroma under the left side which does not seem to produce significant symptoms.  Her flaps appear clean and dry and intact without evidence of infection.  Vital signs: LMP  (LMP Unknown) Comment: Patient is not a good historian   Physical Exam: Constitutional: She appears well, back at her baseline.  Skin: See the review as noted under HPI.  Assessment/Plan: This is a 61 y.o. female s/p bilateral mastectomies for advanced disease, not a chemotherapy candidate, I believe she is going to be evaluated for XRT.  As her seroma appears to be sterile at present, and minimally symptomatic, I will leave it alone without aspiration.  Should that change we will readily see her again and assist with care.  Patient Active Problem List   Diagnosis Date Noted   Blood in stool 03/13/2022   Abnormal gastrointestinal PET scan 01/10/2022   Diabetes mellitus ([place]) 12/05/2021   Status post bilateral mastectomy 11/21/2021   Bilateral breast cancer ([place]) 11/15/2021   Genetic testing 10/07/2021   Vaginitis 09/28/2021   Thrombocytopenia ([place]) 09/08/2021   Malignant neoplasm of left breast ([place]) 08/28/2021   Malignant neoplasm of lower-outer quadrant of right breast of female, estrogen receptor positive ([place]) 08/28/2021   Goals of care, counseling/discussion 08/28/2021   Severe recurrent major depression with psychotic features ([place]) 11/15/2015   Hypertension 11/15/2015   Noncompliance 11/15/2015   Severe major depression, single episode, with psychotic features,  mood-congruent ([place]) 03/29/2015   Protein-calorie malnutrition, severe 03/26/2015   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  We remain readily available.   [name] M.D., FACS 03/19/2022, 7:11 PM

## 2022-03-19 NOTE — Progress Notes (Unsigned)
Patient here for follow up. Reports that she is having back pain and occasional headaches.

## 2022-03-20 ENCOUNTER — Encounter: Payer: Self-pay | Admitting: Oncology

## 2022-03-20 ENCOUNTER — Ambulatory Visit: Payer: Medicaid Other | Admitting: Surgery

## 2022-03-20 DIAGNOSIS — D72819 Decreased white blood cell count, unspecified: Secondary | ICD-10-CM | POA: Insufficient documentation

## 2022-03-20 NOTE — Assessment & Plan Note (Signed)
She has been referred to establish care with GI and no showed.  She has appt in Feb 2024

## 2022-03-20 NOTE — Assessment & Plan Note (Addendum)
A1c has improved. Now managed by PCP

## 2022-03-20 NOTE — Progress Notes (Addendum)
Hematology/Oncology Progress note [phone] [phone]        Patient Care Team: Teodora Medici, DO as PCP - General (Internal Medicine) Brannock, Heath Gold, RN Earlie Server, MD as Consulting Physician (Oncology) Daiva Huge, RN as Oncology Nurse Navigator  ASSESSMENT & PLAN:   Cancer Staging  Malignant neoplasm of left breast [facility]) Staging form: Breast, AJCC 8th Edition - Pathologic stage from 11/25/2021: Stage IB (pT2, pN1, cM0, G2, ER+, PR+, HER2-) - Signed by Earlie Server, MD on 11/25/2021  Malignant neoplasm of lower-outer quadrant of right breast of female, estrogen receptor positive ([place]) Staging form: Breast, AJCC 8th Edition - Pathologic stage from 11/25/2021: Stage IIIA (pT1c, pN3a, cM0, G2, ER+, PR+, HER2-) - Signed by Earlie Server, MD on 11/25/2021   Malignant neoplasm of left breast ([place]) Bilateral breast invasive lobular carcinoma, s/p mastectomy.   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  pT2 pN1a, ER +90%, PR +90%, HER2 negative (1+)  # Right Breast invasive lobular carcinoma Grade 2, negative margins, right axillary lymph nodes  30/32 involved with metastatic carcinoma, pT1c pN3a ER +90%, PR +51-90%, HER2 negative (1+)  [name]  Rationale of using aromatase inhibitor -Arimidex  discussed with patient.  Side effects of Arimidex including but not limited to hot flush, joint pain, fatigue, mood swing, osteoporosis discussed with patient. Patient and  understanding and willing to proceed.    [name]      Malignant neoplasm of lower-outer quadrant of right  breast of female, estrogen receptor positive ([place]) See above plan.  Severe major depression, single episode, with psychotic features, mood-congruent ([place]) [name]   Thrombocytopenia ([place]) Etiology unknown.  Could be related to mild splenomegaly.  Count is close to baseline    Diabetes mellitus ([place]) A1c has improved. Now managed by PCP   Abnormal gastrointestinal PET scan She has been referred to establish care with GI and no showed.  She has appt in Feb 2024    Orders Placed This Encounter  Procedures   CBC with Differential/Platelet    Standing Status:   Future    Standing Expiration Date:   03/20/2023   Comprehensive metabolic panel    Standing Status:   Future    Standing Expiration Date:   09/32/6712   Follicle stimulating hormone    Standing Status:   Future    Standing Expiration Date:   03/20/2023   Estradiol    Standing Status:   Future    Standing Expiration Date:   03/20/2023   Follow up Lab MD 1 month.  [name], questions or concerns.  Earlie Server, MD, PhD [facility] Hematology Oncology 03/19/2022   CHIEF COMPLAINTS/REASON FOR VISIT:  bilateral breast cancer  HISTORY OF PRESENTING ILLNESS:  Sharon Woods is a  61 y.o.  female presents for follow up of bilateral breast cancer  Oncology History  Malignant neoplasm of left breast ([place])  08/06/2021 Mammogram   08/06/2021, digital bilateral mammogram and ultrasound showed left breast 3:00 mass, 1.7 x 1.7 x 1.7 cm, ultrasound of the left axillary is negative. 08/21/2021 right breast 1 x 0.7 x  0.8 cm angulated spiculated mass at the right breast 7:00, 5 cm from nipple.  Ultrasound of the right axillary demonstrates 3 abnormal thickened cortex lymph node. 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 ER+, PR+,  HER 2- T1c - This was found to be concordant by Dr. Franki Cabot right breast 7:00, invasive mammary carcinoma with lobular features, in situ carcinoma,  ER+, PR+, HER 2-This was found to be concordant by Dr. Franki Cabot. right axilla lymph node +, extracapsular extension.  -This was found to be concordant by Dr. Franki Cabot   08/28/2021 Initial Diagnosis   Malignant neoplasm of upper-outer quadrant of left breast in female, estrogen receptor positive ([place])   08/28/2021 Imaging   CT CHEST, ABDOMEN, AND PELVIS WITH CONTRAST Asymmetric mildly enlarged right axillary nodes. Correlate with biopsy. There are some punctate foci of sclerosis in the included spine that could reflect subtle metastatic disease. Nonobstructing bilateral renal calculi.   08/29/2021 Imaging   BILATERAL BREAST MRI WITH AND WITHOUT CONTRAST 1. 2.5 centimeter enhancing mass in the LOWER OUTER QUADRANT of the RIGHT breast, correlating with known malignancy. 2. At least 4 enlarged RIGHT axillary lymph nodes, correlating well with recently biopsied lymph node showing metastatic disease. 3. 3.4 centimeter enhancing mass in the LEFT breast, with an additional 2.9 centimeters of non mass enhancement posterior to the mass also suspicious for malignancy. This likely correlates with the area calcifications seen mammographically. 4. 5 millimeters satellite nodule along the LATERAL aspect of known malignancy in the LOWER OUTER QUADRANT of the LEFT breast. 5. LEFT axilla is negative for adenopathy.       09/05/2021 Imaging   NUCLEAR MEDICINE WHOLE BODY BONE SCAN Uptake at L2 which is nonspecific but may be related to advanced degenerative disc and facet disease changes at both L1-L2 and L2-L3; no evidence of osseous metastatic disease by CT. No definite osseous metastatic lesions identified.    Genetic Testing   Negative genetic testing. No pathogenic variants identified on the [facility] CancerNext-Expanded+RNA panel. The report date is  10/03/2021.  The CancerNext-Expanded + RNAinsight gene panel offered by [employer] and includes sequencing and rearrangement analysis for the following 77 genes: IP, ALK, APC*, ATM*, AXIN2, BAP1, BARD1, BLM, BMPR1A, BRCA1*, BRCA2*, BRIP1*, CDC73, CDH1*,CDK4, CDKN1B, CDKN2A, CHEK2*, CTNNA1, DICER1, FANCC, FH, FLCN, GALNT12, KIF1B, LZTR1, MAX, MEN1, MET, MLH1*, MSH2*, MSH3, MSH6*, MUTYH*, NBN, NF1*, NF2, NTHL1, PALB2*, PHOX2B, PMS2*, POT1, PRKAR1A, PTCH1, PTEN*, RAD51C*, RAD51D*,RB1, RECQL, RET, SDHA, SDHAF2, SDHB, SDHC, SDHD, SMAD4, SMARCA4, SMARCB1, SMARCE1, STK11, SUFU, TMEM127, TP53*,TSC1, TSC2, VHL and XRCC2 (sequencing and deletion/duplication); EGFR, EGLN1, HOXB13, KIT, MITF, PDGFRA, POLD1 and POLE (sequencing only); EPCAM and GREM1 (deletion/duplication only).   11/15/2021 Surgery   She underwent left simple mastectomy and right modified mastectomy.   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  pT2 pN1a, ER +90%, PR +90%, HER2 negative (1+)  # Right Breast invasive lobular carcinoma Grade 2, negative margins, right axillary lymph nodes  30/32 involved with metastatic carcinoma, pT1c pN3a ER +90%, PR +51-90%, HER2 negative (1+)    11/25/2021 Cancer Staging   Staging form: Breast, AJCC 8th Edition - Pathologic stage from 11/25/2021: Stage IB (pT2, pN1, cM0, G2, ER+, PR+, HER2-) - Signed by Earlie Server, MD on 11/25/2021 Stage prefix: Initial diagnosis  Multigene prognostic tests performed: None Histologic grading system: 3 grade system   01/09/2022 - 03/10/2022 Radiation Therapy   Adjuvant breast Radiation.    Malignant neoplasm of lower-outer quadrant of right breast of female, estrogen receptor positive ([place])  08/06/2021 Mammogram   08/06/2021, digital bilateral mammogram and  ultrasound showed left breast 3:00 mass, 1.7 x 1.7 x 1.7 cm, ultrasound of the left axillary is negative. 08/21/2021 right breast 1 x 0.7 x 0.8 cm angulated spiculated mass at the right breast 7:00, 5 cm from nipple.  Ultrasound of the right axillary demonstrates 3 abnormal thickened cortex lymph node. 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 Franki Cabot right breast 7:00, invasive mammary carcinoma with lobular features, in situ carcinoma,  ER+, PR+, HER 2-This was found to be concordant by Dr. Franki Cabot. right axilla lymph node +, extracapsular extension.  -This was found to be concordant by Dr. Franki Cabot   08/28/2021 Initial Diagnosis   Malignant neoplasm of lower-outer quadrant of right breast of female, estrogen receptor positive ([place])   08/28/2021 Imaging   CT CHEST, ABDOMEN, AND PELVIS WITH CONTRAST Asymmetric mildly enlarged right axillary nodes. Correlate with biopsy. There are some punctate foci of sclerosis in the included spine that could reflect subtle metastatic disease. Nonobstructing bilateral renal calculi.   08/29/2021 Imaging   BILATERAL BREAST MRI WITH AND WITHOUT CONTRAST 1. 2.5 centimeter enhancing mass in the LOWER OUTER QUADRANT of the RIGHT breast, correlating with known malignancy. 2. At least 4 enlarged RIGHT axillary lymph nodes, correlating well with recently biopsied lymph node showing metastatic disease. 3. 3.4 centimeter enhancing mass in the LEFT breast, with an additional 2.9 centimeters of non mass enhancement posterior to the mass also suspicious for malignancy. This likely correlates with the area calcifications seen mammographically. 4. 5 millimeters satellite nodule along the LATERAL aspect of known malignancy in the LOWER OUTER QUADRANT of the LEFT breast. 5. LEFT axilla is negative  for adenopathy.       09/05/2021 Imaging   NUCLEAR MEDICINE WHOLE BODY BONE SCAN Uptake at L2 which is nonspecific but may be related to advanced degenerative disc and facet disease changes at both L1-L2 and L2-L3; no evidence of osseous metastatic disease by CT. No definite osseous metastatic lesions identified.   09/09/2021 Oncotype testing   [id number] block RIGHT 7:00 5 CM FN; ULTRASOUND-GUIDED BIOPSY Oncotype Dx RS score 22    Genetic Testing   Negative genetic testing. No pathogenic variants identified on the [facility] CancerNext-Expanded+RNA panel. The report date is 10/03/2021.  The CancerNext-Expanded + RNAinsight gene panel offered by [employer] and includes sequencing and rearrangement analysis for the following 77 genes: IP, ALK, APC*, ATM*, AXIN2, BAP1, BARD1, BLM, BMPR1A, BRCA1*, BRCA2*, BRIP1*, CDC73, CDH1*,CDK4, CDKN1B, CDKN2A, CHEK2*, CTNNA1, DICER1, FANCC, FH, FLCN, GALNT12, KIF1B, LZTR1, MAX, MEN1, MET, MLH1*, MSH2*, MSH3, MSH6*, MUTYH*, NBN, NF1*, NF2, NTHL1, PALB2*, PHOX2B, PMS2*, POT1, PRKAR1A, PTCH1, PTEN*, RAD51C*, RAD51D*,RB1, RECQL, RET, SDHA, SDHAF2, SDHB, SDHC, SDHD, SMAD4, SMARCA4, SMARCB1, SMARCE1, STK11, SUFU, TMEM127, TP53*,TSC1, TSC2, VHL and XRCC2 (sequencing and deletion/duplication); EGFR, EGLN1, HOXB13, KIT, MITF, PDGFRA, POLD1 and POLE (sequencing only); EPCAM and GREM1 (deletion/duplication only).   11/15/2021 Surgery   She underwent left simple mastectomy and right modified mastectomy.   Pathology # Left breast, invasive lobular carcinoma Grade 2, with back ground neoplasia [LCIS and atypical  lobular hyperplasia], benign intraductal papilloma, negative margins, Left axillary SLNB 2/2 involved with metastatic carcinoma, extranodal extension.  pT2 pN1a, ER +90%, PR +90%, HER2 negative (1+)  # Right Breast invasive lobular carcinoma Grade 2, negative margins, right axillary lymph nodes  30/32 involved with metastatic carcinoma, pT1c pN3a ER +90%, PR  +51-90%, HER2 negative (1+)    11/25/2021 Cancer Staging   Staging form: Breast, AJCC 8th Edition - Pathologic stage from 11/25/2021: Stage IIIA (pT1c, pN3a, cM0, G2, ER+, PR+, HER2-) - Signed by Earlie Server, MD on 11/25/2021 Stage prefix: Initial diagnosis Multigene prognostic tests performed: None Histologic grading system: 3 grade system   12/10/2021 Imaging   PET scan  1. Interval bilateral mastectomy and right axillary node dissection.No evidence of residual disease in the chest wall, nodal metastases or distant metastases. 2. Focal hypermetabolic activity in the proximal rectum corresponding with an intraluminal polypoid lesion, suspicious for a villous adenoma or early colon cancer. Sigmoidoscopy/colonoscopy recommended unless recently performed   12/10/2021 Imaging   1. Interval bilateral mastectomy and right axillary node dissection.No evidence of residual disease in the chest wall, nodal metastasesor distant metastases. 2. Focal hypermetabolic activity in the proximal rectum corresponding with an intraluminal polypoid lesion, suspicious for a villous adenoma or early colon cancer. Sigmoidoscopy/colonoscopy recommended unless recently performed    She is a poor historian. History of major depression, psychosis, previously on olanzapine and Remeron not currently on any and if not currently following up with psychiatrist. Patient's family history is positive for sister with breast cancer   INTERVAL HISTORY Sharon Woods is a 61 y.o. female who has above history reviewed by me today presents for follow up visit for management of bilateral breast cancer She has had bilateral mastectomy.  Poor historian. No fever chills. She started on RT. She reports compliant with diabetes treatment.  She has appointment with primary care physician today.      Review of Systems  Constitutional:  Negative for appetite change, chills, fatigue and fever.  HENT:   Negative for hearing loss and voice  change.   Eyes:  Negative for eye problems.  Respiratory:  Negative for chest tightness and cough.   Cardiovascular:  Negative for chest pain.  Gastrointestinal:  Negative for abdominal distention, abdominal pain and blood in stool.  Endocrine: Negative for hot flashes.  Genitourinary:  Negative for difficulty urinating and frequency.   Musculoskeletal:  Negative for arthralgias.  Skin:  Negative for itching and rash.  Neurological:  Negative for extremity weakness.  Hematological:  Negative for adenopathy.  Psychiatric/Behavioral:  Negative for confusion.     MEDICAL HISTORY:  Past Medical History:  Diagnosis Date   Anxiety    Cancer ([place])    Depression    Diabetes mellitus without complication ([place])    History of high cholesterol 2000   Hyperlipidemia    Hypertension    Patient denies medical problems    Thrombocytopenia ([place])     SURGICAL HISTORY: Past Surgical History:  Procedure Laterality Date   ABDOMINAL HYSTERECTOMY     AXILLARY SENTINEL NODE BIOPSY Left 11/15/2021   Procedure: AXILLARY SENTINEL NODE BIOPSY;  Surgeon: [name], MD;  Location: [facility] ORS;  Service: General;  Laterality: Left;   BREAST BIOPSY Right 08/21/2021   [place] bx 7:00 mass coil clip path pending   BREAST BIOPSY Right 08/21/2021   [place] bx of LN, hydro marker, path pending   BREAST BIOPSY Left 08/21/2021   [place] bx, heart marker, path pending   CESAREAN  SECTION     x2   MASTECTOMY MODIFIED RADICAL Right 11/15/2021   Procedure: MASTECTOMY MODIFIED RADICAL;  Surgeon: [name], MD;  Location: [facility] ORS;  Service: General;  Laterality: Right;   NO PAST SURGERIES     TOTAL MASTECTOMY Left 11/15/2021   Procedure: TOTAL MASTECTOMY;  Surgeon: [name], MD;  Location: [facility] ORS;  Service: General;  Laterality: Left;    SOCIAL HISTORY: Social History   Socioeconomic History   Marital status: Married    Spouse name: Not on file   Number of children: Not on file   Years of education:  Not on file   Highest education level: Not on file  Occupational History   Not on file  Tobacco Use   Smoking status: Former    Types: Cigarettes    Quit date: 2013    Years since quitting: 10.9    Passive exposure: Current   Smokeless tobacco: Never  Vaping Use   Vaping Use: Never used  Substance and Sexual Activity   Alcohol use: Not Currently    Comment: occasionally   Drug use: No   Sexual activity: Not Currently    Comment: unable to assess   Other Topics Concern   Not on file  Social History Narrative   Not on file   Social Determinants of Health   Financial Resource Strain: Medium Risk (10/10/2021)   Overall Financial Resource Strain (CARDIA)    Difficulty of Paying Living Expenses: Somewhat hard  Food Insecurity: No Food Insecurity (10/10/2021)   Hunger Vital Sign    Worried About Running Out of Food in the Last Year: Never true    Ran Out of Food in the Last Year: Never true  Transportation Needs: Unmet Transportation Needs (01/21/2022)   PRAPARE - Transportation    Lack of Transportation (Medical): Yes    Lack of Transportation (Non-Medical): Yes  Physical Activity: Inactive (10/10/2021)   Exercise Vital Sign    Days of Exercise per Week: 0 days    Minutes of Exercise per Session: 0 min  Stress: Stress Concern Present (10/10/2021)   [place]    Feeling of Stress : To some extent  Social Connections: Socially Isolated (10/10/2021)   Social Connection and Isolation Panel [NHANES]    Frequency of Communication with Friends and Family: Once a week    Frequency of Social Gatherings with Friends and Family: Never    Attends Religious Services: Never    Printmaker: No    Attends Music therapist: Never    Marital Status: Married  Human resources officer Violence: Not on file    FAMILY HISTORY: Family History  Problem Relation Age of Onset   Lung cancer Mother     Dementia Mother    Parkinson's disease Father    Cancer Father        unk type   Diabetes Sister    Heart attack Sister    Breast cancer Sister    Cancer Maternal Uncle        unk types   Dementia Maternal Grandmother    Cancer Maternal Grandmother        unk type   Cancer Other    Dementia Other     ALLERGIES:  is allergic to tylenol [acetaminophen] and aleve [naproxen].  MEDICATIONS:  Current Outpatient Medications  Medication Sig Dispense Refill   ACCU-CHEK GUIDE test strip USE TO CHECK BLOOD SUGAR UP TO 4  TIMES DAILY AS DIRECTED 100 each 0   Accu-Chek Softclix Lancets lancets USE TO CHECK BLOOD SUAGR UP TO 4 TIMES DAILY AS DIRECTED 100 each 0   anastrozole (ARIMIDEX) 1 MG tablet Take 1 tablet (1 mg total) by mouth daily. 30 tablet 3   blood glucose meter kit and supplies KIT Dispense based on patient and insurance preference. Use up to four times daily as directed. 1 each 1   escitalopram (LEXAPRO) 10 MG tablet Take 1 tablet (10 mg total) by mouth daily. 30 tablet 1   metFORMIN (GLUCOPHAGE) 500 MG tablet Take 1 tablet (500 mg total) by mouth daily. 90 tablet 1   No current facility-administered medications for this visit.     PHYSICAL EXAMINATION: ECOG PERFORMANCE STATUS: 1 - Symptomatic but completely ambulatory Vitals:   03/19/22 1011  BP: (!) 142/95  Pulse: 64  Resp: 18  Temp: 97.9 F (36.6 C)   Filed Weights   03/19/22 1011  Weight: 161 lb 11.2 oz (73.3 kg)    Physical Exam Constitutional:      General: She is not in acute distress. HENT:     Head: Normocephalic and atraumatic.  Eyes:     General: No scleral icterus. Cardiovascular:     Rate and Rhythm: Normal rate.  Pulmonary:     Effort: Pulmonary effort is normal. No respiratory distress.     Breath sounds: No wheezing.  Abdominal:     General: There is no distension.  Musculoskeletal:        General: No deformity. Normal range of motion.     Cervical back: Normal range of motion and neck  supple.  Skin:    Coloration: Skin is not jaundiced.  Neurological:     Mental Status: She is alert and oriented to person, place, and time. Mental status is at baseline.     LABORATORY DATA:  I have reviewed the data as listed    Latest Ref Rng & Units 03/19/2022    9:55 AM 02/28/2022   11:11 AM 02/13/2022   10:51 AM  CBC  WBC 4.0 - 10.5 K/uL 2.3  3.1  2.5   Hemoglobin 12.0 - 15.0 g/dL 13.0  13.7  13.0   Hematocrit 36.0 - 46.0 % 39.8  40.9  39.3   Platelets 150 - 400 K/uL 62  58  55       Latest Ref Rng & Units 03/19/2022    9:55 AM 02/13/2022   10:51 AM 01/10/2022   11:07 AM  CMP  Glucose 70 - 99 mg/dL 144  138  101   BUN 6 - 20 mg/dL _0 Creatinine 0.44 - 1.00 mg/dL 0.61  0.66  0.68   Sodium 135 - 145 mmol/L 141  140  141   Potassium 3.5 - 5.1 mmol/L 3.8  3.5  3.5   Chloride 98 - 111 mmol/L 107  104  107   CO2 22 - 32 mmol/L _1 Calcium 8.9 - 10.3 mg/dL 9.0  9.3  9.7   Total Protein 6.5 - 8.1 g/dL 7.3  7.2  8.3   Total Bilirubin 0.3 - 1.2 mg/dL 0.5  0.8  0.7   Alkaline Phos 38 - 126 U/L 64  58  60   AST 15 - 41 U/L _2 ALT 0 - 44 U/L _3 RADIOGRAPHIC STUDIES: I have personally reviewed  the radiological images as listed and agreed with the findings in the report. No results found.

## 2022-03-20 NOTE — Assessment & Plan Note (Signed)
See above plan. 

## 2022-03-20 NOTE — Assessment & Plan Note (Signed)
Etiology unknown.  Could be related to mild splenomegaly.  Count is close to baseline

## 2022-03-20 NOTE — Assessment & Plan Note (Signed)
[name]

## 2022-03-25 NOTE — Progress Notes (Signed)
Called to speak with patient about the Gordy Levan, reached her voicemail. Left her a message asking that she return my call.

## 2022-03-27 NOTE — Progress Notes (Signed)
Enrolled patient into the [employer] 

## 2022-04-02 NOTE — Progress Notes (Signed)
[facility] POST-OP OFFICE VISIT  04/05/2022  HPI: Sharon Woods is a 62 y.o. female status post bilateral mastectomies for advanced bilateral disease, right worse than left.  She denies any fevers or chills.  She does note the irregular scarring/excess adipose under her left axilla.  There is no appreciable seroma today.  Her flaps appear clean and dry and intact without evidence of infection. She seems to have adequate range of motion.  Vital signs: BP 130/80   Pulse 79   Temp 98 F (36.7 C)   Ht '5\' 1"'$  (1.549 m)   Wt 156 lb (70.8 kg)   LMP  (LMP Unknown) Comment: Patient is not a good historian  SpO2 98%   BMI 29.48 kg/m    Physical Exam: Constitutional: She appears well, back at her baseline.  Skin: See the review as noted under HPI.  Assessment/Plan: This is a 62 y.o. female s/p bilateral mastectomies for advanced disease, not a chemotherapy candidate, appears to be tolerating her radiation changes well.   Patient Active Problem List   Diagnosis Date Noted   Leukopenia 03/20/2022   Blood in stool 03/13/2022   Abnormal gastrointestinal PET scan 01/10/2022   Diabetes mellitus ([place]) 12/05/2021   Status post bilateral mastectomy 11/21/2021   Genetic testing 10/07/2021   Vaginitis 09/28/2021   Thrombocytopenia ([place]) 09/08/2021   Malignant neoplasm of left breast ([place]) 08/28/2021   Malignant neoplasm of lower-outer quadrant of right breast of female, estrogen receptor positive ([place]) 08/28/2021   Goals of care, counseling/discussion 08/28/2021   Severe recurrent major depression with psychotic features ([place]) 11/15/2015   Hypertension 11/15/2015   Noncompliance 11/15/2015   Severe major depression, single episode, with psychotic features, mood-congruent ([place]) 03/29/2015   Protein-calorie malnutrition, severe 03/26/2015   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  We remain readily available.   [name] M.D.,  FACS 04/05/2022, 4:29 PM

## 2022-04-03 ENCOUNTER — Ambulatory Visit (INDEPENDENT_AMBULATORY_CARE_PROVIDER_SITE_OTHER): Payer: Medicaid Other | Admitting: Surgery

## 2022-04-03 ENCOUNTER — Encounter: Payer: Self-pay | Admitting: Surgery

## 2022-04-03 VITALS — BP 130/80 | HR 79 | Temp 98.0°F | Ht 61.0 in | Wt 156.0 lb

## 2022-04-03 DIAGNOSIS — C50511 Malignant neoplasm of lower-outer quadrant of right female breast: Secondary | ICD-10-CM

## 2022-04-03 DIAGNOSIS — C50412 Malignant neoplasm of upper-outer quadrant of left female breast: Secondary | ICD-10-CM | POA: Diagnosis not present

## 2022-04-03 DIAGNOSIS — Z9013 Acquired absence of bilateral breasts and nipples: Secondary | ICD-10-CM | POA: Diagnosis not present

## 2022-04-03 NOTE — Patient Instructions (Signed)
Follow up here in 6 months for a recheck.    Please call and ask to speak with a nurse if you develop questions or concerns.    

## 2022-04-14 NOTE — Progress Notes (Unsigned)
Established Patient Office Visit  Subjective    Patient ID: Sharon Woods, female    DOB: [birth year]  Age: 62 y.o. MRN: [id number]  CC:  No chief complaint on file.   HPI Sharon Woods presents to follow-up on new medication.  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 Per chart review patient is not a candidate for adjuvant IV chemotherapy but was just started on endocrine therapy, now on Arimidex 1 mg -Family history of mother, father and sister with breast cancer  Diabetes, Type 2: -Last A1c 5.4 11/23 -Medications: Metformin 500 mg -Patient is compliant with the above medications and reports no side effects.  -Checking BG at home: No cannot afford test strips -Eye exam: Due -Foot exam: UTD 12/23 -Microalbumin: UTD 10/23 -Statin: No -PNA vaccine: Discuss at follow-up -Denies symptoms of hypoglycemia, polyuria, polydipsia, numbness extremities, foot ulcers/trauma.   History of questionable hypertension: -Medications: Nothing -Blood pressure here well controlled  HLD: -Medications: Nothing currently -Last lipid panel: Lipid Panel     Component Value Date/Time   CHOL 255 (H) 04/16/2015 0659   TRIG 236 (H) 04/16/2015 0659   HDL 69 04/16/2015 0659   CHOLHDL 3.7 04/16/2015 0659   VLDL 47 (H) 04/16/2015 0659   LDLCALC 139 (H) 04/16/2015 4401    MDD: -Mood status: better -Current treatment: Lexapro 10 mg (increased at [place]) -Satisfied with current treatment?: yes -Symptom severity: moderate  -Duration of current treatment :  Weeks -Side effects: no Medication compliance: excellent compliance -Previous psychiatric medications: prozac, seroquel, and zyprexa -patient states that she has been on medications for depression in the past, however her "husband did not want her to be on them" and she felt like a "zombie" so she  discontinued them however she also states that she is under a lot of stress both at home and with her health Depressed mood: yes     03/04/2022   11:14 AM 01/20/2022    2:14 PM 10/10/2021    3:06 PM 10/10/2021    3:04 PM 10/10/2021    3:03 PM  Depression screen PHQ 2/9  Decreased Interest 0 0 1 0 1  Down, Depressed, Hopeless 0 1 0 0 1  PHQ - 2 Score 0 1 1 0 2  Altered sleeping 0 2 0  0  Tired, decreased energy 0 '2 1  1  '$ Change in appetite 0 2 1  0  Feeling bad or failure about yourself  0 0 0  0  Trouble concentrating 0 0 0  1  Moving slowly or fidgety/restless 0 0 1  0  Suicidal thoughts 0 0 0  0  PHQ-9 Score 0 '7 4  4  '$ Difficult doing work/chores Not difficult at all Somewhat difficult Somewhat difficult  Somewhat difficult   Health maintenance: -Blood work up-to-date -Colon cancer screening due -Flu vaccine due today, will discuss pneumonia vaccine at follow-up  Outpatient Encounter Medications as of 04/15/2022  Medication Sig   ACCU-CHEK GUIDE test strip USE TO CHECK BLOOD SUGAR UP TO 4 TIMES DAILY AS DIRECTED   Accu-Chek Softclix Lancets lancets USE TO CHECK BLOOD SUAGR UP TO 4 TIMES DAILY AS DIRECTED   anastrozole (ARIMIDEX) 1 MG tablet Take 1 tablet (1 mg total) by mouth daily.   blood glucose meter kit and supplies KIT Dispense based on patient and insurance preference. Use up to four times daily  as directed.   escitalopram (LEXAPRO) 10 MG tablet Take 1 tablet (10 mg total) by mouth daily.   metFORMIN (GLUCOPHAGE) 500 MG tablet Take 1 tablet (500 mg total) by mouth daily.   No facility-administered encounter medications on file as of 04/15/2022.    Past Medical History:  Diagnosis Date   Anxiety    Cancer ([place])    Depression    Diabetes mellitus without complication ([place])    History of high cholesterol 2000   Hyperlipidemia    Hypertension    Patient denies medical problems    Thrombocytopenia ([place])     Past Surgical History:  Procedure Laterality Date    ABDOMINAL HYSTERECTOMY     AXILLARY SENTINEL NODE BIOPSY Left 11/15/2021   Procedure: AXILLARY SENTINEL NODE BIOPSY;  Surgeon: [name], MD;  Location: [facility] ORS;  Service: General;  Laterality: Left;   BREAST BIOPSY Right 08/21/2021   [place] bx 7:00 mass coil clip path pending   BREAST BIOPSY Right 08/21/2021   [place] bx of LN, hydro marker, path pending   BREAST BIOPSY Left 08/21/2021   [place] bx, heart marker, path pending   CESAREAN SECTION     x2   MASTECTOMY MODIFIED RADICAL Right 11/15/2021   Procedure: MASTECTOMY MODIFIED RADICAL;  Surgeon: [name], MD;  Location: [facility] ORS;  Service: General;  Laterality: Right;   NO PAST SURGERIES     TOTAL MASTECTOMY Left 11/15/2021   Procedure: TOTAL MASTECTOMY;  Surgeon: [name], MD;  Location: [facility] ORS;  Service: General;  Laterality: Left;    Family History  Problem Relation Age of Onset   Lung cancer Mother    Dementia Mother    Parkinson's disease Father    Cancer Father        unk type   Diabetes Sister    Heart attack Sister    Breast cancer Sister    Cancer Maternal Uncle        unk types   Dementia Maternal Grandmother    Cancer Maternal Grandmother        unk type   Cancer Other    Dementia Other     Social History   Socioeconomic History   Marital status: Married    Spouse name: Not on file   Number of children: Not on file   Years of education: Not on file   Highest education level: Not on file  Occupational History   Not on file  Tobacco Use   Smoking status: Former    Types: Cigarettes    Quit date: 2013    Years since quitting: 11.0    Passive exposure: Current   Smokeless tobacco: Never  Vaping Use   Vaping Use: Never used  Substance and Sexual Activity   Alcohol use: Not Currently    Comment: occasionally   Drug use: No   Sexual activity: Not Currently    Comment: unable to assess   Other Topics Concern   Not on file  Social History Narrative   Not on file   Social Determinants  of Health   Financial Resource Strain: Medium Risk (10/10/2021)   Overall Financial Resource Strain (CARDIA)    Difficulty of Paying Living Expenses: Somewhat hard  Food Insecurity: No Food Insecurity (10/10/2021)   Hunger Vital Sign    Worried About Running Out of Food in the Last Year: Never true    Ran Out of Food in the Last Year: Never true  Transportation Needs: Unmet Transportation Needs (01/21/2022)  PRAPARE - Hydrologist (Medical): Yes    Lack of Transportation (Non-Medical): Yes  Physical Activity: Inactive (10/10/2021)   Exercise Vital Sign    Days of Exercise per Week: 0 days    Minutes of Exercise per Session: 0 min  Stress: Stress Concern Present (10/10/2021)   [place]    Feeling of Stress : To some extent  Social Connections: Socially Isolated (10/10/2021)   Social Connection and Isolation Panel [NHANES]    Frequency of Communication with Friends and Family: Once a week    Frequency of Social Gatherings with Friends and Family: Never    Attends Religious Services: Never    Marine scientist or Organizations: No    Attends Archivist Meetings: Never    Marital Status: Married  Human resources officer Violence: Not on file    Review of Systems  Constitutional:  Negative for chills and fever.  Eyes:  Negative for blurred vision.  Respiratory:  Negative for shortness of breath.   Cardiovascular:  Negative for chest pain.      Objective    LMP  (LMP Unknown) Comment: Patient is not a good historian  Physical Exam Constitutional:      Appearance: Normal appearance.  HENT:     Head: Normocephalic and atraumatic.  Eyes:     Conjunctiva/sclera: Conjunctivae normal.  Cardiovascular:     Rate and Rhythm: Normal rate and regular rhythm.     Pulses:          Dorsalis pedis pulses are 2+ on the right side and 2+ on the left side.  Pulmonary:     Effort:  Pulmonary effort is normal.     Breath sounds: Normal breath sounds.  Musculoskeletal:     Right foot: Normal range of motion. No deformity, bunion, Charcot foot, foot drop or prominent metatarsal heads.     Left foot: Normal range of motion. No deformity, bunion, Charcot foot, foot drop or prominent metatarsal heads.  Feet:     Right foot:     Protective Sensation: 6 sites tested.  6 sites sensed.     Skin integrity: Skin integrity normal.     Toenail Condition: Right toenails are normal.     Left foot:     Protective Sensation: 6 sites tested.  6 sites sensed.     Skin integrity: Skin integrity normal.     Toenail Condition: Left toenails are normal.  Skin:    General: Skin is warm and dry.  Neurological:     General: No focal deficit present.     Mental Status: She is alert. Mental status is at baseline.  Psychiatric:        Mood and Affect: Mood normal.         Assessment & Plan:   1. Type 2 diabetes mellitus with hyperglycemia, without long-term current use of insulin ([place]): Diabetes well controlled, A1c 5.2 in October.  Doing well with metformin 500 mg daily.  Foot exam today.  - HM Diabetes Foot Exam  2. Moderate episode of recurrent major depressive disorder ([place]): States overall her moods have improved since being on the Lexapro, states she is not as irritable or quick to anger.  We will increase dose to 10 mg daily and follow-up in 6 weeks.  - escitalopram (LEXAPRO) 10 MG tablet; Take 1 tablet (10 mg total) by mouth daily.  Dispense: 30 tablet; Refill: 1  3. Flu  vaccine need: Flu vaccine administered today.  - Flu Vaccine QUAD 6+ mos PF IM (Fluarix Quad PF)   No follow-ups on file.   Teodora Medici, DO

## 2022-04-15 ENCOUNTER — Encounter: Payer: Self-pay | Admitting: Internal Medicine

## 2022-04-15 ENCOUNTER — Ambulatory Visit (INDEPENDENT_AMBULATORY_CARE_PROVIDER_SITE_OTHER): Payer: Medicaid Other | Admitting: Internal Medicine

## 2022-04-15 VITALS — BP 120/84 | HR 77 | Temp 97.9°F | Resp 18 | Wt 160.1 lb

## 2022-04-15 DIAGNOSIS — F331 Major depressive disorder, recurrent, moderate: Secondary | ICD-10-CM

## 2022-04-15 DIAGNOSIS — L299 Pruritus, unspecified: Secondary | ICD-10-CM

## 2022-04-15 DIAGNOSIS — C50912 Malignant neoplasm of unspecified site of left female breast: Secondary | ICD-10-CM

## 2022-04-15 DIAGNOSIS — C50911 Malignant neoplasm of unspecified site of right female breast: Secondary | ICD-10-CM | POA: Diagnosis not present

## 2022-04-15 DIAGNOSIS — F419 Anxiety disorder, unspecified: Secondary | ICD-10-CM

## 2022-04-15 DIAGNOSIS — E1165 Type 2 diabetes mellitus with hyperglycemia: Secondary | ICD-10-CM | POA: Diagnosis not present

## 2022-04-15 MED ORDER — ESCITALOPRAM OXALATE 10 MG PO TABS: 10.0000 mg | ORAL_TABLET | Freq: Every day | ORAL | 1 refills | Status: DC

## 2022-04-15 MED ORDER — HYDROCORTISONE 1 % EX OINT: 1.0000 | TOPICAL_OINTMENT | Freq: Two times a day (BID) | CUTANEOUS | 0 refills | Status: DC

## 2022-04-15 MED ORDER — HYDROXYZINE HCL 10 MG PO TABS: 10.0000 mg | ORAL_TABLET | Freq: Every day | ORAL | 0 refills | Status: DC | PRN

## 2022-04-15 NOTE — Patient Instructions (Signed)
It was great seeing you today!  Plan discussed at today's visit: -Continue Lexapro 10 mg -New anxiety medication sent to pharmacy - can make you sleepy so take at night   Follow up in: 3 months   Take care and let us know if you have any questions or concerns prior to your next visit.  Dr. Rosana Berger

## 2022-04-21 ENCOUNTER — Ambulatory Visit
Admission: RE | Admit: 2022-04-21 | Discharge: 2022-04-21 | Disposition: A | Discharge status: Home / Self Care | Payer: Medicaid Other | Source: Ambulatory Visit | Attending: Radiation Oncology | Admitting: Radiation Oncology

## 2022-04-21 ENCOUNTER — Encounter: Payer: Self-pay | Admitting: Radiation Oncology

## 2022-04-21 VITALS — BP 131/82 | HR 75 | Temp 97.5°F | Ht 62.0 in | Wt 165.0 lb

## 2022-04-21 DIAGNOSIS — C50011 Malignant neoplasm of nipple and areola, right female breast: Secondary | ICD-10-CM

## 2022-04-21 DIAGNOSIS — C50412 Malignant neoplasm of upper-outer quadrant of left female breast: Secondary | ICD-10-CM | POA: Diagnosis not present

## 2022-04-21 DIAGNOSIS — Z79811 Long term (current) use of aromatase inhibitors: Secondary | ICD-10-CM | POA: Diagnosis not present

## 2022-04-21 DIAGNOSIS — C50511 Malignant neoplasm of lower-outer quadrant of right female breast: Secondary | ICD-10-CM | POA: Insufficient documentation

## 2022-04-21 DIAGNOSIS — Z923 Personal history of irradiation: Secondary | ICD-10-CM | POA: Diagnosis not present

## 2022-04-21 DIAGNOSIS — Z170 Estrogen receptor positive status [ER+]: Secondary | ICD-10-CM

## 2022-04-21 NOTE — Progress Notes (Signed)
Radiation Oncology Follow up Note  Name: Sharon Woods   Date:   04/21/2022 MRN:  [id number] DOB: May 18, 1960    This 62 y.o. female presents to the clinic today for 1 month follow-up status post whole breast radiation to her left breast status post left modified radical mastectomy for stage IIb (pT2 cN1 M0) ER/PR positive invasive mammary carcinoma.  Her right breast status post mastectomy for stage IIIc (T1 N3 M0) grade 2 ER/PR positive invasive mammary carcinoma.  Status post bilateral chest wall and peripheral lymphatic radiation.  REFERRING PROVIDER: Teodora Medici, DO  HPI: Patient is a 62 year old female now out 1 month having completed bilateral chest wall radiation for above-stated bilateral breast cancers.  Seen today in routine follow-up she is doing well.  She specifically denies chest wall tenderness any new nodularity or masses any swelling in her upper extremities cough or bone pain..  COMPLICATIONS OF TREATMENT: none  FOLLOW UP COMPLIANCE: keeps appointments   PHYSICAL EXAM:  BP 131/82 (BP Location: Left Arm, Patient Position: Sitting, Cuff Size: Normal)   Pulse 75   Temp (!) 97.5 F (36.4 C) (Tympanic)   Ht '5\' 2"'$  (1.575 m) Comment: stated ht  Wt 165 lb (74.8 kg)   LMP  (LMP Unknown) Comment: Patient is not a good historian  BMI 30.18 kg/m  Patient status post bilateral mastectomies incisions are healed well.  No evidence of chest wall mass or nodularity is noted.  No axillary or supraclavicular adenopathy is identified.  No evidence of lymphedema in her upper extremities is noted.  Well-developed well-nourished patient in NAD. HEENT reveals PERLA, EOMI, discs not visualized.  Oral cavity is clear. No oral mucosal lesions are identified. Neck is clear without evidence of cervical or supraclavicular adenopathy. Lungs are clear to A&P. Cardiac examination is essentially unremarkable with regular rate and rhythm without murmur rub or thrill. Abdomen is benign with no  organomegaly or masses noted. Motor sensory and DTR levels are equal and symmetric in the upper and lower extremities. Cranial nerves II through XII are grossly intact. Proprioception is intact. No peripheral adenopathy or edema is identified. No motor or sensory levels are noted. Crude visual fields are within normal range.  RADIOLOGY RESULTS: No current films for review  PLAN: Present time patient is doing well 1 month out from bilateral chest wall radiation..  Pleased with her overall progress.  Of asked to see her back in 6 months for follow-up.  She is currently on Arimidex tolerating it well without side effect.  Patient is to call with any concerns.  I would like to take this opportunity to thank you for allowing me to participate in the care of your patient.Noreene Filbert, MD

## 2022-04-29 ENCOUNTER — Encounter: Payer: Self-pay | Admitting: Oncology

## 2022-04-29 ENCOUNTER — Other Ambulatory Visit: Payer: Self-pay

## 2022-04-29 ENCOUNTER — Inpatient Hospital Stay (HOSPITAL_BASED_OUTPATIENT_CLINIC_OR_DEPARTMENT_OTHER): Payer: Medicaid Other | Admitting: Oncology

## 2022-04-29 ENCOUNTER — Inpatient Hospital Stay: Payer: Medicaid Other | Attending: Radiation Oncology

## 2022-04-29 VITALS — BP 142/91 | HR 72 | Temp 97.8°F | Resp 18 | Wt 165.8 lb

## 2022-04-29 DIAGNOSIS — C50511 Malignant neoplasm of lower-outer quadrant of right female breast: Secondary | ICD-10-CM | POA: Diagnosis not present

## 2022-04-29 DIAGNOSIS — R948 Abnormal results of function studies of other organs and systems: Secondary | ICD-10-CM

## 2022-04-29 DIAGNOSIS — D696 Thrombocytopenia, unspecified: Secondary | ICD-10-CM

## 2022-04-29 DIAGNOSIS — Z170 Estrogen receptor positive status [ER+]: Secondary | ICD-10-CM

## 2022-04-29 DIAGNOSIS — F322 Major depressive disorder, single episode, severe without psychotic features: Secondary | ICD-10-CM | POA: Diagnosis not present

## 2022-04-29 DIAGNOSIS — Z79811 Long term (current) use of aromatase inhibitors: Secondary | ICD-10-CM | POA: Insufficient documentation

## 2022-04-29 DIAGNOSIS — D72819 Decreased white blood cell count, unspecified: Secondary | ICD-10-CM | POA: Insufficient documentation

## 2022-04-29 DIAGNOSIS — C50412 Malignant neoplasm of upper-outer quadrant of left female breast: Secondary | ICD-10-CM | POA: Insufficient documentation

## 2022-04-29 DIAGNOSIS — D72810 Lymphocytopenia: Secondary | ICD-10-CM

## 2022-04-29 DIAGNOSIS — R933 Abnormal findings on diagnostic imaging of other parts of digestive tract: Secondary | ICD-10-CM | POA: Diagnosis not present

## 2022-04-29 DIAGNOSIS — F323 Major depressive disorder, single episode, severe with psychotic features: Secondary | ICD-10-CM | POA: Diagnosis not present

## 2022-04-29 LAB — CBC WITH DIFFERENTIAL/PLATELET
Abs Immature Granulocytes: 0.01 10*3/uL (ref 0.00–0.07)
Basophils Absolute: 0 10*3/uL (ref 0.0–0.1)
Basophils Relative: 1 %
Eosinophils Absolute: 0.1 10*3/uL (ref 0.0–0.5)
Eosinophils Relative: 3 %
HCT: 36.7 % (ref 36.0–46.0)
Hemoglobin: 12 g/dL (ref 12.0–15.0)
Immature Granulocytes: 0 %
Lymphocytes Relative: 17 %
Lymphs Abs: 0.4 10*3/uL — ABNORMAL LOW (ref 0.7–4.0)
MCH: 29.9 pg (ref 26.0–34.0)
MCHC: 32.7 g/dL (ref 30.0–36.0)
MCV: 91.3 fL (ref 80.0–100.0)
Monocytes Absolute: 0.2 10*3/uL (ref 0.1–1.0)
Monocytes Relative: 7 %
Neutro Abs: 1.7 10*3/uL (ref 1.7–7.7)
Neutrophils Relative %: 72 %
Platelets: 58 10*3/uL — ABNORMAL LOW (ref 150–400)
RBC: 4.02 MIL/uL (ref 3.87–5.11)
RDW: 14.3 % (ref 11.5–15.5)
WBC: 2.4 10*3/uL — ABNORMAL LOW (ref 4.0–10.5)
nRBC: 0 % (ref 0.0–0.2)

## 2022-04-29 LAB — COMPREHENSIVE METABOLIC PANEL
ALT: 18 U/L (ref 0–44)
AST: 26 U/L (ref 15–41)
Albumin: 3.6 g/dL (ref 3.5–5.0)
Alkaline Phosphatase: 68 U/L (ref 38–126)
Anion gap: 8 (ref 5–15)
BUN: 17 mg/dL (ref 8–23)
CO2: 25 mmol/L (ref 22–32)
Calcium: 8.8 mg/dL — ABNORMAL LOW (ref 8.9–10.3)
Chloride: 105 mmol/L (ref 98–111)
Creatinine, Ser: 0.72 mg/dL (ref 0.44–1.00)
GFR, Estimated: >60 mL/min (ref >60)
Glucose, Bld: 213 mg/dL — ABNORMAL HIGH (ref 70–99)
Potassium: 3.8 mmol/L (ref 3.5–5.1)
Sodium: 138 mmol/L (ref 135–145)
Total Bilirubin: 0.5 mg/dL (ref 0.3–1.2)
Total Protein: 6.9 g/dL (ref 6.5–8.1)

## 2022-04-29 LAB — IMMATURE PLATELET FRACTION: Immature Platelet Fraction: 3.7 % (ref 1.2–8.6)

## 2022-04-29 MED ORDER — CALCIUM CARBONATE 600 MG PO TABS: 1200.0000 mg | ORAL_TABLET | Freq: Every day | ORAL | 5 refills | Status: AC

## 2022-04-29 NOTE — Assessment & Plan Note (Signed)
See above plan. 

## 2022-04-29 NOTE — Progress Notes (Signed)
Pt here for follow up. No new concerns voiced.   

## 2022-04-29 NOTE — Assessment & Plan Note (Signed)
She has been referred to establish care with GI and no showed.  She has appt in Feb 2024

## 2022-04-29 NOTE — Assessment & Plan Note (Signed)
[name]

## 2022-04-29 NOTE — Assessment & Plan Note (Addendum)
Etiology unknown.   mild splenomegaly.  However immature platelet fraction is inappropriately normal, indicating decreased marrow production.  Pending additional work up

## 2022-04-29 NOTE — Assessment & Plan Note (Addendum)
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  She was started on Lexpro in Oct 2023 Drug induced leukopenia vs other etiologies.  Check B12, folate, flowcytometry, LDH.

## 2022-04-29 NOTE — Assessment & Plan Note (Addendum)
Bilateral breast invasive lobular carcinoma, s/p bilateral mastectomy.   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  pT2 pN1a, ER +90%, PR +90%, HER2 negative (1+)  # Right Breast invasive lobular carcinoma Grade 2, negative margins, right axillary lymph nodes  30/32 involved with metastatic carcinoma, pT1c pN3a ER +90%, PR +51-90%, HER2 negative (1+)  [name]  She tolerates Arimidex '1mg'$  daily well. Continue current regimen.    CDK4/5 inhibitor may be challenging due to her baseline thrombocytopenia.  Close monitor clinically.

## 2022-04-29 NOTE — Progress Notes (Signed)
Hematology/Oncology Progress note [phone] [phone]        Patient Care Team: Teodora Medici, DO as PCP - General (Internal Medicine) Brannock, Heath Gold, RN Earlie Server, MD as Consulting Physician (Oncology) Daiva Huge, RN as Oncology Nurse Navigator  ASSESSMENT & PLAN:   Cancer Staging  Malignant neoplasm of left breast [facility]) Staging form: Breast, AJCC 8th Edition - Pathologic stage from 11/25/2021: Stage IB (pT2, pN1, cM0, G2, ER+, PR+, HER2-) - Signed by Earlie Server, MD on 11/25/2021  Malignant neoplasm of lower-outer quadrant of right breast of female, estrogen receptor positive ([place]) Staging form: Breast, AJCC 8th Edition - Pathologic stage from 11/25/2021: Stage IIIA (pT1c, pN3a, cM0, G2, ER+, PR+, HER2-) - Signed by Earlie Server, MD on 11/25/2021   Malignant neoplasm of left breast ([place]) Bilateral breast invasive lobular carcinoma, s/p bilateral mastectomy.   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  pT2 pN1a, ER +90%, PR +90%, HER2 negative (1+)  # Right Breast invasive lobular carcinoma Grade 2, negative margins, right axillary lymph nodes  30/32 involved with metastatic carcinoma, pT1c pN3a ER +90%, PR +51-90%, HER2 negative (1+)  [name]  She tolerates Arimidex '1mg'$  daily well. Continue current regimen.    CDK4/5 inhibitor may be challenging due to her baseline thrombocytopenia.  Close monitor clinically.      Malignant neoplasm of lower-outer quadrant of right breast of female, estrogen receptor positive ([place]) See above plan.  Abnormal gastrointestinal PET scan She has been referred to establish care with GI and  no showed.  She has appt in Feb 2024   Severe major depression, single episode, with psychotic features, mood-congruent ([place]) [name]   Thrombocytopenia ([place]) Etiology unknown.  Could be related to mild splenomegaly.  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  She was started on Lexpro in Oct 2023 Drug induced leukopenia vs other etiologies.  Check B12, folate, flowcytometry, LDH.      Orders Placed This Encounter  Procedures   CBC with Differential/Platelet    Standing Status:   Future    Standing Expiration Date:   04/29/2023   Comprehensive metabolic panel    Standing Status:   Future    Standing Expiration Date:   04/29/2023   CA 27.29 (SERIAL MONITOR)    Standing Status:   Future    Standing Expiration Date:   04/30/2023   Cancer antigen 15-3    Standing Status:   Future    Standing Expiration Date:   04/30/2023   Follow up Lab MD 2 months.  [name], questions or concerns.  Earlie Server, MD, PhD [facility] Hematology Oncology 04/29/2022   CHIEF COMPLAINTS/REASON FOR VISIT:  bilateral breast cancer  HISTORY OF PRESENTING ILLNESS:  Sharon Woods is a  62 y.o.  female presents for follow up of bilateral breast cancer  Oncology History  Malignant neoplasm of left breast ([place])  08/06/2021 Mammogram   08/06/2021, digital bilateral mammogram and ultrasound showed left breast 3:00 mass, 1.7 x 1.7  x 1.7 cm, ultrasound of the left axillary is negative. 08/21/2021 right breast 1 x 0.7 x 0.8 cm angulated spiculated mass at the right breast 7:00, 5 cm from nipple.  Ultrasound of the right axillary demonstrates 3 abnormal thickened cortex lymph node. Patient was recommended to proceed with biopsy left breast 3:00  invasive mammry carcinoma with lobular features. in situ carcinoma, lobular neoplasia. 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 Franki Cabot right breast 7:00, invasive mammary carcinoma with lobular features, in situ carcinoma,  ER+, PR+, HER 2-This was found to be concordant by Dr. Franki Cabot. right axilla lymph node +, extracapsular extension.  -This was found to be concordant by Dr. Franki Cabot   08/28/2021 Initial Diagnosis   Malignant neoplasm of upper-outer quadrant of left breast in female, estrogen receptor positive ([place])   08/28/2021 Imaging   CT CHEST, ABDOMEN, AND PELVIS WITH CONTRAST Asymmetric mildly enlarged right axillary nodes. Correlate with biopsy. There are some punctate foci of sclerosis in the included spine that could reflect subtle metastatic disease. Nonobstructing bilateral renal calculi.   08/29/2021 Imaging   BILATERAL BREAST MRI WITH AND WITHOUT CONTRAST 1. 2.5 centimeter enhancing mass in the LOWER OUTER QUADRANT of the RIGHT breast, correlating with known malignancy. 2. At least 4 enlarged RIGHT axillary lymph nodes, correlating well with recently biopsied lymph node showing metastatic disease. 3. 3.4 centimeter enhancing mass in the LEFT breast, with an additional 2.9 centimeters of non mass enhancement posterior to the mass also suspicious for malignancy. This likely correlates with the area calcifications seen mammographically. 4. 5 millimeters satellite nodule along the LATERAL aspect of known malignancy in the LOWER OUTER QUADRANT of the LEFT breast. 5. LEFT axilla is negative for adenopathy.       09/05/2021 Imaging   NUCLEAR MEDICINE WHOLE BODY BONE SCAN Uptake at L2 which is nonspecific but may be related to advanced degenerative disc and facet disease changes at both L1-L2 and L2-L3; no evidence of osseous metastatic disease by CT. No definite osseous metastatic lesions identified.    Genetic Testing   Negative genetic testing.  No pathogenic variants identified on the [facility] CancerNext-Expanded+RNA panel. The report date is 10/03/2021.  The CancerNext-Expanded + RNAinsight gene panel offered by [employer] and includes sequencing and rearrangement analysis for the following 77 genes: IP, ALK, APC*, ATM*, AXIN2, BAP1, BARD1, BLM, BMPR1A, BRCA1*, BRCA2*, BRIP1*, CDC73, CDH1*,CDK4, CDKN1B, CDKN2A, CHEK2*, CTNNA1, DICER1, FANCC, FH, FLCN, GALNT12, KIF1B, LZTR1, MAX, MEN1, MET, MLH1*, MSH2*, MSH3, MSH6*, MUTYH*, NBN, NF1*, NF2, NTHL1, PALB2*, PHOX2B, PMS2*, POT1, PRKAR1A, PTCH1, PTEN*, RAD51C*, RAD51D*,RB1, RECQL, RET, SDHA, SDHAF2, SDHB, SDHC, SDHD, SMAD4, SMARCA4, SMARCB1, SMARCE1, STK11, SUFU, TMEM127, TP53*,TSC1, TSC2, VHL and XRCC2 (sequencing and deletion/duplication); EGFR, EGLN1, HOXB13, KIT, MITF, PDGFRA, POLD1 and POLE (sequencing only); EPCAM and GREM1 (deletion/duplication only).   11/15/2021 Surgery   She underwent left simple mastectomy and right modified mastectomy.   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  pT2 pN1a, ER +90%, PR +90%, HER2 negative (1+)  # Right Breast invasive lobular carcinoma Grade 2, negative margins, right axillary lymph nodes  30/32 involved with metastatic carcinoma, pT1c pN3a ER +90%, PR +51-90%, HER2 negative (1+)    11/25/2021 Cancer Staging   Staging form: Breast, AJCC 8th Edition - Pathologic stage from 11/25/2021: Stage IB (pT2, pN1,  cM0, G2, ER+, PR+, HER2-) - Signed by Earlie Server, MD on 11/25/2021 Stage prefix: Initial diagnosis Multigene prognostic tests performed: None Histologic grading system: 3 grade system   01/09/2022 - 03/10/2022 Radiation Therapy   Adjuvant breast Radiation.    Malignant neoplasm of lower-outer quadrant of right breast of female, estrogen  receptor positive ([place])  08/06/2021 Mammogram   08/06/2021, digital bilateral mammogram and ultrasound showed left breast 3:00 mass, 1.7 x 1.7 x 1.7 cm, ultrasound of the left axillary is negative. 08/21/2021 right breast 1 x 0.7 x 0.8 cm angulated spiculated mass at the right breast 7:00, 5 cm from nipple.  Ultrasound of the right axillary demonstrates 3 abnormal thickened cortex lymph node. 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 Franki Cabot right breast 7:00, invasive mammary carcinoma with lobular features, in situ carcinoma,  ER+, PR+, HER 2-This was found to be concordant by Dr. Franki Cabot. right axilla lymph node +, extracapsular extension.  -This was found to be concordant by Dr. Franki Cabot   08/28/2021 Initial Diagnosis   Malignant neoplasm of lower-outer quadrant of right breast of female, estrogen receptor positive ([place])   08/28/2021 Imaging   CT CHEST, ABDOMEN, AND PELVIS WITH CONTRAST Asymmetric mildly enlarged right axillary nodes. Correlate with biopsy. There are some punctate foci of sclerosis in the included spine that could reflect subtle metastatic disease. Nonobstructing bilateral renal calculi.   08/29/2021 Imaging   BILATERAL BREAST MRI WITH AND WITHOUT CONTRAST 1. 2.5 centimeter enhancing mass in the LOWER OUTER QUADRANT of the RIGHT breast, correlating with known malignancy. 2. At least 4 enlarged RIGHT axillary lymph nodes, correlating well with recently biopsied lymph node showing metastatic disease. 3. 3.4 centimeter enhancing mass in the LEFT breast, with an additional 2.9 centimeters of non mass enhancement posterior to the mass also suspicious for malignancy. This likely correlates with the area calcifications seen mammographically. 4. 5 millimeters satellite nodule along the LATERAL aspect of  known malignancy in the LOWER OUTER QUADRANT of the LEFT breast. 5. LEFT axilla is negative for adenopathy.       09/05/2021 Imaging   NUCLEAR MEDICINE WHOLE BODY BONE SCAN Uptake at L2 which is nonspecific but may be related to advanced degenerative disc and facet disease changes at both L1-L2 and L2-L3; no evidence of osseous metastatic disease by CT. No definite osseous metastatic lesions identified.   09/09/2021 Oncotype testing   [id number] block RIGHT 7:00 5 CM FN; ULTRASOUND-GUIDED BIOPSY Oncotype Dx RS score 22    Genetic Testing   Negative genetic testing. No pathogenic variants identified on the [facility] CancerNext-Expanded+RNA panel. The report date is 10/03/2021.  The CancerNext-Expanded + RNAinsight gene panel offered by [employer] and includes sequencing and rearrangement analysis for the following 77 genes: IP, ALK, APC*, ATM*, AXIN2, BAP1, BARD1, BLM, BMPR1A, BRCA1*, BRCA2*, BRIP1*, CDC73, CDH1*,CDK4, CDKN1B, CDKN2A, CHEK2*, CTNNA1, DICER1, FANCC, FH, FLCN, GALNT12, KIF1B, LZTR1, MAX, MEN1, MET, MLH1*, MSH2*, MSH3, MSH6*, MUTYH*, NBN, NF1*, NF2, NTHL1, PALB2*, PHOX2B, PMS2*, POT1, PRKAR1A, PTCH1, PTEN*, RAD51C*, RAD51D*,RB1, RECQL, RET, SDHA, SDHAF2, SDHB, SDHC, SDHD, SMAD4, SMARCA4, SMARCB1, SMARCE1, STK11, SUFU, TMEM127, TP53*,TSC1, TSC2, VHL and XRCC2 (sequencing and deletion/duplication); EGFR, EGLN1, HOXB13, KIT, MITF, PDGFRA, POLD1 and POLE (sequencing only); EPCAM and GREM1 (deletion/duplication only).   11/15/2021 Surgery   She underwent left simple mastectomy and right modified mastectomy.  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  pT2 pN1a, ER +90%, PR +90%, HER2 negative (1+)  # Right Breast invasive lobular carcinoma Grade 2, negative margins,  right axillary lymph nodes  30/32 involved with metastatic carcinoma, pT1c pN3a ER +90%, PR +51-90%, HER2 negative (1+)    11/25/2021 Cancer Staging   Staging form: Breast, AJCC 8th Edition - Pathologic stage from 11/25/2021: Stage IIIA (pT1c, pN3a, cM0, G2, ER+, PR+, HER2-) - Signed by Earlie Server, MD on 11/25/2021 Stage prefix: Initial diagnosis Multigene prognostic tests performed: None Histologic grading system: 3 grade system   12/10/2021 Imaging   PET scan  1. Interval bilateral mastectomy and right axillary node dissection.No evidence of residual disease in the chest wall, nodal metastases or distant metastases. 2. Focal hypermetabolic activity in the proximal rectum corresponding with an intraluminal polypoid lesion, suspicious for a villous adenoma or early colon cancer. Sigmoidoscopy/colonoscopy recommended unless recently performed   12/10/2021 Imaging   1. Interval bilateral mastectomy and right axillary node dissection.No evidence of residual disease in the chest wall, nodal metastasesor distant metastases. 2. Focal hypermetabolic activity in the proximal rectum corresponding with an intraluminal polypoid lesion, suspicious for a villous adenoma or early colon cancer. Sigmoidoscopy/colonoscopy recommended unless recently performed    She is a poor historian. History of major depression, psychosis, previously on olanzapine and Remeron not currently on any and if not currently following up with psychiatrist. Patient's family history is positive for sister with breast cancer   INTERVAL HISTORY Sharon Woods is a 62 y.o. female who has above history reviewed by me today presents for follow up visit for management of bilateral breast cancer She has been on Lexapro since Oct 2023.  Take Arimidex '1mg'$  daily since Dec 2023, she reports some muscle/joint pain but overall manageable.     Review of Systems  Constitutional:  Negative for appetite change, chills, fatigue and fever.   HENT:   Negative for hearing loss and voice change.   Eyes:  Negative for eye problems.  Respiratory:  Negative for chest tightness and cough.   Cardiovascular:  Negative for chest pain.  Gastrointestinal:  Negative for abdominal distention, abdominal pain and blood in stool.  Endocrine: Negative for hot flashes.  Genitourinary:  Negative for difficulty urinating and frequency.   Musculoskeletal:  Negative for arthralgias.  Skin:  Negative for itching and rash.  Neurological:  Negative for extremity weakness.  Hematological:  Negative for adenopathy.  Psychiatric/Behavioral:  Negative for confusion.     MEDICAL HISTORY:  Past Medical History:  Diagnosis Date   Anxiety    Cancer ([place])    Depression    Diabetes mellitus without complication ([place])    History of high cholesterol 2000   Hyperlipidemia    Hypertension    Patient denies medical problems    Thrombocytopenia ([place])     SURGICAL HISTORY: Past Surgical History:  Procedure Laterality Date   ABDOMINAL HYSTERECTOMY     AXILLARY SENTINEL NODE BIOPSY Left 11/15/2021   Procedure: AXILLARY SENTINEL NODE BIOPSY;  Surgeon: [name], MD;  Location: [facility] ORS;  Service: General;  Laterality: Left;   BREAST BIOPSY Right 08/21/2021   [place] bx 7:00 mass coil clip path pending   BREAST BIOPSY Right 08/21/2021   [place] bx of LN, hydro marker, path pending   BREAST BIOPSY Left 08/21/2021  [place] bx, heart marker, path pending   CESAREAN SECTION     x2   MASTECTOMY MODIFIED RADICAL Right 11/15/2021   Procedure: MASTECTOMY MODIFIED RADICAL;  Surgeon: [name], MD;  Location: [facility] ORS;  Service: General;  Laterality: Right;   NO PAST SURGERIES     TOTAL MASTECTOMY Left 11/15/2021   Procedure: TOTAL MASTECTOMY;  Surgeon: [name], MD;  Location: [facility] ORS;  Service: General;  Laterality: Left;    SOCIAL HISTORY: Social History   Socioeconomic History   Marital status: Married    Spouse name: Not on file   Number of  children: Not on file   Years of education: Not on file   Highest education level: Not on file  Occupational History   Not on file  Tobacco Use   Smoking status: Former    Types: Cigarettes    Quit date: 2013    Years since quitting: 11.0    Passive exposure: Current   Smokeless tobacco: Never  Vaping Use   Vaping Use: Never used  Substance and Sexual Activity   Alcohol use: Not Currently    Comment: occasionally   Drug use: No   Sexual activity: Not Currently    Comment: unable to assess   Other Topics Concern   Not on file  Social History Narrative   Not on file   Social Determinants of Health   Financial Resource Strain: Medium Risk (10/10/2021)   Overall Financial Resource Strain (CARDIA)    Difficulty of Paying Living Expenses: Somewhat hard  Food Insecurity: No Food Insecurity (10/10/2021)   Hunger Vital Sign    Worried About Running Out of Food in the Last Year: Never true    Ran Out of Food in the Last Year: Never true  Transportation Needs: Unmet Transportation Needs (01/21/2022)   PRAPARE - Transportation    Lack of Transportation (Medical): Yes    Lack of Transportation (Non-Medical): Yes  Physical Activity: Inactive (10/10/2021)   Exercise Vital Sign    Days of Exercise per Week: 0 days    Minutes of Exercise per Session: 0 min  Stress: Stress Concern Present (10/10/2021)   [place]    Feeling of Stress : To some extent  Social Connections: Socially Isolated (10/10/2021)   Social Connection and Isolation Panel [NHANES]    Frequency of Communication with Friends and Family: Once a week    Frequency of Social Gatherings with Friends and Family: Never    Attends Religious Services: Never    Printmaker: No    Attends Music therapist: Never    Marital Status: Married  Human resources officer Violence: Not on file    FAMILY HISTORY: Family History  Problem  Relation Age of Onset   Lung cancer Mother    Dementia Mother    Parkinson's disease Father    Cancer Father        unk type   Diabetes Sister    Heart attack Sister    Breast cancer Sister    Cancer Maternal Uncle        unk types   Dementia Maternal Grandmother    Cancer Maternal Grandmother        unk type   Cancer Other    Dementia Other     ALLERGIES:  is allergic to tylenol [acetaminophen] and aleve [naproxen].  MEDICATIONS:  Current Outpatient Medications  Medication Sig Dispense Refill   ACCU-CHEK GUIDE test  strip USE TO CHECK BLOOD SUGAR UP TO 4 TIMES DAILY AS DIRECTED 100 each 0   Accu-Chek Softclix Lancets lancets USE TO CHECK BLOOD SUAGR UP TO 4 TIMES DAILY AS DIRECTED 100 each 0   anastrozole (ARIMIDEX) 1 MG tablet Take 1 tablet (1 mg total) by mouth daily. 30 tablet 3   blood glucose meter kit and supplies KIT Dispense based on patient and insurance preference. Use up to four times daily as directed. 1 each 1   calcium carbonate (OS-CAL) 600 MG TABS tablet Take 2 tablets (1,200 mg total) by mouth daily. 60 tablet 5   escitalopram (LEXAPRO) 10 MG tablet Take 1 tablet (10 mg total) by mouth daily. 30 tablet 1   hydrocortisone 1 % ointment Apply 1 Application topically 2 (two) times daily. 30 g 0   hydrOXYzine (ATARAX) 10 MG tablet Take 1 tablet (10 mg total) by mouth daily as needed for anxiety or itching. 30 tablet 0   metFORMIN (GLUCOPHAGE) 500 MG tablet Take 1 tablet (500 mg total) by mouth daily. 90 tablet 1   No current facility-administered medications for this visit.     PHYSICAL EXAMINATION: ECOG PERFORMANCE STATUS: 1 - Symptomatic but completely ambulatory Vitals:   04/29/22 1044  BP: (!) 142/91  Pulse: 72  Resp: 18  Temp: 97.8 F (36.6 C)   Filed Weights   04/29/22 1044  Weight: 165 lb 12.8 oz (75.2 kg)    Physical Exam Constitutional:      General: She is not in acute distress. HENT:     Head: Normocephalic and atraumatic.  Eyes:      General: No scleral icterus. Cardiovascular:     Rate and Rhythm: Normal rate.  Pulmonary:     Effort: Pulmonary effort is normal. No respiratory distress.     Breath sounds: No wheezing.  Abdominal:     General: There is no distension.  Musculoskeletal:        General: No deformity. Normal range of motion.     Cervical back: Normal range of motion and neck supple.  Skin:    Coloration: Skin is not jaundiced.  Neurological:     Mental Status: She is alert and oriented to person, place, and time. Mental status is at baseline.     LABORATORY DATA:  I have reviewed the data as listed    Latest Ref Rng & Units 04/29/2022   10:18 AM 03/19/2022    9:55 AM 02/28/2022   11:11 AM  CBC  WBC 4.0 - 10.5 K/uL 2.4  2.3  3.1   Hemoglobin 12.0 - 15.0 g/dL 12.0  13.0  13.7   Hematocrit 36.0 - 46.0 % 36.7  39.8  40.9   Platelets 150 - 400 K/uL 58  62  58       Latest Ref Rng & Units 04/29/2022   10:18 AM 03/19/2022    9:55 AM 02/13/2022   10:51 AM  CMP  Glucose 70 - 99 mg/dL 213  144  138   BUN 8 - 23 mg/dL '17  12  13   '$ Creatinine 0.44 - 1.00 mg/dL 0.72  0.61  0.66   Sodium 135 - 145 mmol/L 138  141  140   Potassium 3.5 - 5.1 mmol/L 3.8  3.8  3.5   Chloride 98 - 111 mmol/L 105  107  104   CO2 22 - 32 mmol/L '25  28  26   '$ Calcium 8.9 - 10.3 mg/dL 8.8  9.0  9.3  Total Protein 6.5 - 8.1 g/dL 6.9  7.3  7.2   Total Bilirubin 0.3 - 1.2 mg/dL 0.5  0.5  0.8   Alkaline Phos 38 - 126 U/L 68  64  58   AST 15 - 41 U/L '26  25  23   '$ ALT 0 - 44 U/L '18  16  16     '$ RADIOGRAPHIC STUDIES: I have personally reviewed the radiological images as listed and agreed with the findings in the report. No results found.

## 2022-04-30 ENCOUNTER — Telehealth: Payer: Self-pay

## 2022-04-30 NOTE — Telephone Encounter (Signed)
-----  Message from Earlie Server, MD sent at 04/29/2022  8:27 PM EST ----- Please let patient know that her blood work shows persistently low white count and low platelet counts. I recommend additional blood work; -ordered.  Please arrange lab encounter thanks.

## 2022-04-30 NOTE — Telephone Encounter (Signed)
Call pt  and husband and no answer. Detailed message left of MD recommendation. Advised for her to call back to set up lab appt.

## 2022-05-01 LAB — FOLLICLE STIMULATING HORMONE: FSH: 47.3 m[IU]/mL (ref 25.8–134.8)

## 2022-05-01 LAB — ESTRADIOL: Estradiol: <5 pg/mL (ref 0.0–54.7)

## 2022-05-05 ENCOUNTER — Inpatient Hospital Stay: Payer: Medicaid Other | Attending: Radiation Oncology

## 2022-05-05 DIAGNOSIS — C50511 Malignant neoplasm of lower-outer quadrant of right female breast: Secondary | ICD-10-CM | POA: Insufficient documentation

## 2022-05-05 DIAGNOSIS — D696 Thrombocytopenia, unspecified: Secondary | ICD-10-CM

## 2022-05-05 DIAGNOSIS — D72810 Lymphocytopenia: Secondary | ICD-10-CM

## 2022-05-05 LAB — FOLATE: Folate: 22 ng/mL (ref >5.9)

## 2022-05-05 LAB — VITAMIN B12: Vitamin B-12: 372 pg/mL (ref 180–914)

## 2022-05-05 LAB — LACTATE DEHYDROGENASE: LDH: 133 U/L (ref 98–192)

## 2022-05-07 LAB — COMP PANEL: LEUKEMIA/LYMPHOMA

## 2022-05-09 ENCOUNTER — Encounter: Payer: Self-pay | Admitting: Nurse Practitioner

## 2022-05-09 ENCOUNTER — Ambulatory Visit (INDEPENDENT_AMBULATORY_CARE_PROVIDER_SITE_OTHER): Payer: Medicaid Other | Admitting: Nurse Practitioner

## 2022-05-09 VITALS — BP 122/64 | HR 73 | Temp 98.2°F | Ht 62.0 in | Wt 163.0 lb

## 2022-05-09 DIAGNOSIS — E1169 Type 2 diabetes mellitus with other specified complication: Secondary | ICD-10-CM | POA: Diagnosis not present

## 2022-05-09 DIAGNOSIS — F331 Major depressive disorder, recurrent, moderate: Secondary | ICD-10-CM

## 2022-05-09 DIAGNOSIS — I10 Essential (primary) hypertension: Secondary | ICD-10-CM | POA: Diagnosis not present

## 2022-05-09 DIAGNOSIS — F419 Anxiety disorder, unspecified: Secondary | ICD-10-CM | POA: Diagnosis not present

## 2022-05-09 DIAGNOSIS — L299 Pruritus, unspecified: Secondary | ICD-10-CM

## 2022-05-09 MED ORDER — HYDROXYZINE HCL 10 MG PO TABS: 10.0000 mg | ORAL_TABLET | Freq: Every day | ORAL | 0 refills | Status: DC | PRN

## 2022-05-09 MED ORDER — HYDROCORTISONE 2.5 % EX CREA: TOPICAL_CREAM | Freq: Two times a day (BID) | CUTANEOUS | 0 refills | Status: DC

## 2022-05-09 NOTE — Patient Instructions (Addendum)
Continue the current medication. Your medication has been refilled. Call [facility]  [phone] to schedule an appointment. She is due for labs at cancer center, Please ask them to add lipid panel.

## 2022-05-09 NOTE — Progress Notes (Signed)
Established Patient Office Visit  Subjective    Patient ID: Sharon Woods, female    DOB: 08-Feb-1961  Age: 62 y.o. MRN: [id number]  CC:  Chief Complaint  Patient presents with   Medical Management of Chronic Issues    3 month follow up on diabetes    HPI Sharon Woods presents for routine follow up. She has history of diabetes and hypertension. She is no longer taking any medication for blood pressure. She is taking metformin 500 mg once daily for diabetes. Her last Hemglobin A1c was 5.4 on 02/13/22.  She has h/o breast cancer had have completed chemotherapy in December, 2023.   She has off and on blood in stool. She has an appointment with GI for further evaluation.   Outpatient Encounter Medications as of 05/09/2022  Medication Sig   ACCU-CHEK GUIDE test strip USE TO CHECK BLOOD SUGAR UP TO 4 TIMES DAILY AS DIRECTED   Accu-Chek Softclix Lancets lancets USE TO CHECK BLOOD SUAGR UP TO 4 TIMES DAILY AS DIRECTED   anastrozole (ARIMIDEX) 1 MG tablet Take 1 tablet (1 mg total) by mouth daily.   blood glucose meter kit and supplies KIT Dispense based on patient and insurance preference. Use up to four times daily as directed.   calcium carbonate (OS-CAL) 600 MG TABS tablet Take 2 tablets (1,200 mg total) by mouth daily.   escitalopram (LEXAPRO) 10 MG tablet Take 1 tablet (10 mg total) by mouth daily.   hydrocortisone 2.5 % cream Apply topically 2 (two) times daily.   metFORMIN (GLUCOPHAGE) 500 MG tablet Take 1 tablet (500 mg total) by mouth daily.   [DISCONTINUED] hydrocortisone 1 % ointment Apply 1 Application topically 2 (two) times daily.   [DISCONTINUED] hydrOXYzine (ATARAX) 10 MG tablet Take 1 tablet (10 mg total) by mouth daily as needed for anxiety or itching.   hydrOXYzine (ATARAX) 10 MG tablet Take 1 tablet (10 mg total) by mouth daily as needed for anxiety or itching.   [DISCONTINUED] escitalopram (LEXAPRO) 10 MG tablet Take 1 tablet (10 mg total) by mouth daily.   No  facility-administered encounter medications on file as of 05/09/2022.    Past Medical History:  Diagnosis Date   Anxiety    Cancer ([place])    Depression    Diabetes mellitus without complication ([place])    History of high cholesterol 2000   Hyperlipidemia    Hypertension    Patient denies medical problems    Thrombocytopenia ([place])     Past Surgical History:  Procedure Laterality Date   ABDOMINAL HYSTERECTOMY     AXILLARY SENTINEL NODE BIOPSY Left 11/15/2021   Procedure: AXILLARY SENTINEL NODE BIOPSY;  Surgeon: [name], MD;  Location: [facility] ORS;  Service: General;  Laterality: Left;   BREAST BIOPSY Right 08/21/2021   [place] bx 7:00 mass coil clip path pending   BREAST BIOPSY Right 08/21/2021   [place] bx of LN, hydro marker, path pending   BREAST BIOPSY Left 08/21/2021   [place] bx, heart marker, path pending   CESAREAN SECTION     x2   MASTECTOMY MODIFIED RADICAL Right 11/15/2021   Procedure: MASTECTOMY MODIFIED RADICAL;  Surgeon: [name], MD;  Location: [facility] ORS;  Service: General;  Laterality: Right;   NO PAST SURGERIES     TOTAL MASTECTOMY Left 11/15/2021   Procedure: TOTAL MASTECTOMY;  Surgeon: [name], MD;  Location: [facility] ORS;  Service: General;  Laterality: Left;    Family History  Problem Relation Age of Onset  Lung cancer Mother    Dementia Mother    Parkinson's disease Father    Cancer Father        unk type   Diabetes Sister    Heart attack Sister    Breast cancer Sister    Cancer Maternal Uncle        unk types   Dementia Maternal Grandmother    Cancer Maternal Grandmother        unk type   Cancer Other    Dementia Other     Social History   Socioeconomic History   Marital status: Married    Spouse name: Not on file   Number of children: Not on file   Years of education: Not on file   Highest education level: Not on file  Occupational History   Not on file  Tobacco Use   Smoking status: Former    Types: Cigarettes    Quit date: 2013     Years since quitting: 11.1    Passive exposure: Current   Smokeless tobacco: Never  Vaping Use   Vaping Use: Never used  Substance and Sexual Activity   Alcohol use: Not Currently    Comment: occasionally   Drug use: No   Sexual activity: Not Currently    Comment: unable to assess   Other Topics Concern   Not on file  Social History Narrative   Not on file   Social Determinants of Health   Financial Resource Strain: Medium Risk (10/10/2021)   Overall Financial Resource Strain (CARDIA)    Difficulty of Paying Living Expenses: Somewhat hard  Food Insecurity: No Food Insecurity (10/10/2021)   Hunger Vital Sign    Worried About Running Out of Food in the Last Year: Never true    Ran Out of Food in the Last Year: Never true  Transportation Needs: Unmet Transportation Needs (01/21/2022)   PRAPARE - Transportation    Lack of Transportation (Medical): Yes    Lack of Transportation (Non-Medical): Yes  Physical Activity: Inactive (10/10/2021)   Exercise Vital Sign    Days of Exercise per Week: 0 days    Minutes of Exercise per Session: 0 min  Stress: Stress Concern Present (10/10/2021)   [place]    Feeling of Stress : To some extent  Social Connections: Socially Isolated (10/10/2021)   Social Connection and Isolation Panel [NHANES]    Frequency of Communication with Friends and Family: Once a week    Frequency of Social Gatherings with Friends and Family: Never    Attends Religious Services: Never    Marine scientist or Organizations: No    Attends Archivist Meetings: Never    Marital Status: Married  Human resources officer Violence: Not on file   Review of Systems  Constitutional: Negative.   HENT: Negative.    Eyes: Negative.   Respiratory:  Negative for cough and shortness of breath.   Cardiovascular:  Negative for palpitations and leg swelling.  Gastrointestinal:  Positive for blood in stool.   Genitourinary: Negative.   Musculoskeletal: Negative.   Skin: Negative.   Neurological: Negative.   Psychiatric/Behavioral: Negative.         Objective    BP 122/64   Pulse 73   Temp 98.2 F (36.8 C) (Oral)   Ht '5\' 2"'$  (1.575 m)   Wt 163 lb (73.9 kg)   LMP  (LMP Unknown) Comment: Patient is not a good historian  SpO2 97%  BMI 29.81 kg/m   Physical Exam Constitutional:      Appearance: Normal appearance.  HENT:     Right Ear: Tympanic membrane normal.     Left Ear: Tympanic membrane normal.     Mouth/Throat:     Mouth: Mucous membranes are moist.  Eyes:     Conjunctiva/sclera: Conjunctivae normal.     Pupils: Pupils are equal, round, and reactive to light.  Cardiovascular:     Rate and Rhythm: Normal rate and regular rhythm.     Pulses: Normal pulses.     Heart sounds: Normal heart sounds.  Pulmonary:     Effort: Pulmonary effort is normal.     Breath sounds: Normal breath sounds.  Abdominal:     General: Bowel sounds are normal. There is distension.     Palpations: Abdomen is soft. There is no mass.     Hernia: No hernia is present.  Skin:    General: Skin is warm.  Neurological:     General: No focal deficit present.     Mental Status: She is alert and oriented to person, place, and time. Mental status is at baseline.  Psychiatric:        Mood and Affect: Mood normal.        Behavior: Behavior normal.        Thought Content: Thought content normal.        Judgment: Judgment normal.         Assessment & Plan:   Problem List Items Addressed This Visit       Cardiovascular and Mediastinum   Hypertension    Patient BP  Vitals:   05/09/22 1255  BP: 122/64    in the office. Not taking any medication at present diet-controlled.         Endocrine   Diabetes mellitus ([place]) - Primary (Chronic)    Her previous hemoglobin A1c 5.4 on 02/13/2022. Continue metformin 500 mg daily. Encouraged to eat variety of food including fruits, vegetables, whole  grains, complex carbohydrates and proteins.          Other   Anxiety    Stable at present. Continue hydroxyzine as needed. .      Relevant Medications   hydrOXYzine (ATARAX) 10 MG tablet    Return in about 4 months (around 09/07/2022).   Theresia Lo, NP

## 2022-05-13 ENCOUNTER — Ambulatory Visit: Payer: Self-pay | Admitting: Gastroenterology

## 2022-05-13 NOTE — Progress Notes (Deleted)
Jonathon Bellows MD, MRCP(U.K) [address]  [place]  [place], [place] [address]  Main: [phone]  Fax: [phone]   Gastroenterology Consultation  Referring Provider:     Theresia Lo, NP Primary Care Physician:  Teodora Medici, DO Primary Gastroenterologist:  Dr. Jonathon Bellows  Reason for Consultation:     Blood in stool , colon cancer screening         HPI:   Sharon Woods is a 62 y.o. y/o female referred for consultation & management  by Dr. Teodora Medici, DO.     04/29/2022: Hb 12 grams  12/09/2021: PET scan shows hypermetabolic activity in proximal rectum - ? Adenoma or early colon cancer.  Follows with Dr Tasia Catchings for breast cancer.  Past Medical History:  Diagnosis Date   Anxiety    Cancer ([place])    Depression    Diabetes mellitus without complication ([place])    History of high cholesterol 2000   Hyperlipidemia    Hypertension    Patient denies medical problems    Thrombocytopenia ([place])     Past Surgical History:  Procedure Laterality Date   ABDOMINAL HYSTERECTOMY     AXILLARY SENTINEL NODE BIOPSY Left 11/15/2021   Procedure: AXILLARY SENTINEL NODE BIOPSY;  Surgeon: [name], MD;  Location: [facility] ORS;  Service: General;  Laterality: Left;   BREAST BIOPSY Right 08/21/2021   [place] bx 7:00 mass coil clip path pending   BREAST BIOPSY Right 08/21/2021   [place] bx of LN, hydro marker, path pending   BREAST BIOPSY Left 08/21/2021   [place] bx, heart marker, path pending   CESAREAN SECTION     x2   MASTECTOMY MODIFIED RADICAL Right 11/15/2021   Procedure: MASTECTOMY MODIFIED RADICAL;  Surgeon: [name], MD;  Location: [facility];  Service: General;  Laterality: Right;   NO PAST SURGERIES     TOTAL MASTECTOMY Left 11/15/2021   Procedure: TOTAL MASTECTOMY;  Surgeon: [name], MD;  Location: [facility] ORS;  Service: General;  Laterality: Left;    Prior to Admission medications   Medication Sig Start Date End Date Taking? Authorizing Provider   ACCU-CHEK GUIDE test strip USE TO CHECK BLOOD SUGAR UP TO 4 TIMES DAILY AS DIRECTED 11/13/21   Earlie Server, MD  Accu-Chek Softclix Lancets lancets USE TO CHECK BLOOD SUAGR UP TO 4 TIMES DAILY AS DIRECTED 12/03/21   Earlie Server, MD  anastrozole (ARIMIDEX) 1 MG tablet Take 1 tablet (1 mg total) by mouth daily. 03/19/22   Earlie Server, MD  blood glucose meter kit and supplies KIT Dispense based on patient and insurance preference. Use up to four times daily as directed. 09/27/21   Earlie Server, MD  calcium carbonate (OS-CAL) 600 MG TABS tablet Take 2 tablets (1,200 mg total) by mouth daily. 04/29/22   Earlie Server, MD  escitalopram (LEXAPRO) 10 MG tablet Take 1 tablet (10 mg total) by mouth daily. 04/15/22   Teodora Medici, DO  hydrocortisone 2.5 % cream Apply topically 2 (two) times daily. 05/09/22   Theresia Lo, NP  hydrOXYzine (ATARAX) 10 MG tablet Take 1 tablet (10 mg total) by mouth daily as needed for anxiety or itching. 05/09/22   Theresia Lo, NP  metFORMIN (GLUCOPHAGE) 500 MG tablet Take 1 tablet (500 mg total) by mouth daily. 01/20/22   Teodora Medici, DO    Family History  Problem Relation Age of Onset   Lung cancer Mother    Dementia Mother    Parkinson's disease Father  Cancer Father        unk type   Diabetes Sister    Heart attack Sister    Breast cancer Sister    Cancer Maternal Uncle        unk types   Dementia Maternal Grandmother    Cancer Maternal Grandmother        unk type   Cancer Other    Dementia Other      Social History   Tobacco Use   Smoking status: Former    Types: Cigarettes    Quit date: 2013    Years since quitting: 11.1    Passive exposure: Current   Smokeless tobacco: Never  Vaping Use   Vaping Use: Never used  Substance Use Topics   Alcohol use: Not Currently    Comment: occasionally   Drug use: No    Allergies as of 05/13/2022 - Review Complete 05/09/2022  Allergen Reaction Noted   Tylenol [acetaminophen]  10/21/2021   Aleve [naproxen]  Rash 10/21/2021    Review of Systems:    All systems reviewed and negative except where noted in HPI.   Physical Exam:  LMP  (LMP Unknown) Comment: Patient is not a good historian No LMP recorded (lmp unknown). Patient has had a hysterectomy. Psych:  Alert and cooperative. Normal mood and affect. General:   Alert,  Well-developed, well-nourished, pleasant and cooperative in NAD Head:  Normocephalic and atraumatic. Eyes:  Sclera clear, no icterus.   Conjunctiva pink. Ears:  Normal auditory acuity. Neck:  Supple; no masses or thyromegaly. Lungs:  Respirations even and unlabored.  Clear throughout to auscultation.   No wheezes, crackles, or rhonchi. No acute distress. Heart:  Regular rate and rhythm; no murmurs, clicks, rubs, or gallops. Abdomen:  Normal bowel sounds.  No bruits.  Soft, non-tender and non-distended without masses, hepatosplenomegaly or hernias noted.  No guarding or rebound tenderness.    Neurologic:  Alert and oriented x3;  grossly normal neurologically. Psych:  Alert and cooperative. Normal mood and affect.  Imaging Studies: No results found.  Assessment and Plan:   Sharon Woods is a 62 y.o. y/o female has been referred for Blood in stool . PET scan in 11/2021 shows hypermetabolic activity in rectum.   Plan  Urgent colonoscopy   Follow up in ***  Dr Jonathon Bellows MD,MRCP(U.K)    BP check ***

## 2022-05-25 DIAGNOSIS — F419 Anxiety disorder, unspecified: Secondary | ICD-10-CM | POA: Insufficient documentation

## 2022-05-25 NOTE — Assessment & Plan Note (Signed)
Patient BP  Vitals:   05/09/22 1255  BP: 122/64    in the office. Not taking any medication at present diet-controlled.

## 2022-05-25 NOTE — Assessment & Plan Note (Addendum)
Stable at present. Continue hydroxyzine as needed. Marland Kitchen

## 2022-05-25 NOTE — Assessment & Plan Note (Signed)
Her previous hemoglobin A1c 5.4 on 02/13/2022. Continue metformin 500 mg daily. Encouraged to eat variety of food including fruits, vegetables, whole grains, complex carbohydrates and proteins.

## 2022-06-03 ENCOUNTER — Other Ambulatory Visit: Payer: Self-pay | Admitting: Nurse Practitioner

## 2022-06-03 DIAGNOSIS — F419 Anxiety disorder, unspecified: Secondary | ICD-10-CM

## 2022-06-24 ENCOUNTER — Inpatient Hospital Stay (HOSPITAL_BASED_OUTPATIENT_CLINIC_OR_DEPARTMENT_OTHER): Payer: Medicaid Other | Admitting: Oncology

## 2022-06-24 ENCOUNTER — Inpatient Hospital Stay: Payer: Medicaid Other | Attending: Radiation Oncology

## 2022-06-24 ENCOUNTER — Telehealth: Payer: Self-pay

## 2022-06-24 ENCOUNTER — Encounter: Payer: Self-pay | Admitting: Oncology

## 2022-06-24 VITALS — BP 164/93 | HR 67 | Temp 96.8°F | Resp 18 | Wt 162.6 lb

## 2022-06-24 DIAGNOSIS — D72819 Decreased white blood cell count, unspecified: Secondary | ICD-10-CM | POA: Insufficient documentation

## 2022-06-24 DIAGNOSIS — R948 Abnormal results of function studies of other organs and systems: Secondary | ICD-10-CM | POA: Insufficient documentation

## 2022-06-24 DIAGNOSIS — Z170 Estrogen receptor positive status [ER+]: Secondary | ICD-10-CM | POA: Insufficient documentation

## 2022-06-24 DIAGNOSIS — F329 Major depressive disorder, single episode, unspecified: Secondary | ICD-10-CM | POA: Diagnosis not present

## 2022-06-24 DIAGNOSIS — Z79811 Long term (current) use of aromatase inhibitors: Secondary | ICD-10-CM | POA: Diagnosis not present

## 2022-06-24 DIAGNOSIS — C50011 Malignant neoplasm of nipple and areola, right female breast: Secondary | ICD-10-CM

## 2022-06-24 DIAGNOSIS — Z801 Family history of malignant neoplasm of trachea, bronchus and lung: Secondary | ICD-10-CM | POA: Insufficient documentation

## 2022-06-24 DIAGNOSIS — D72810 Lymphocytopenia: Secondary | ICD-10-CM

## 2022-06-24 DIAGNOSIS — Z803 Family history of malignant neoplasm of breast: Secondary | ICD-10-CM | POA: Diagnosis not present

## 2022-06-24 DIAGNOSIS — Z809 Family history of malignant neoplasm, unspecified: Secondary | ICD-10-CM | POA: Diagnosis not present

## 2022-06-24 DIAGNOSIS — C50511 Malignant neoplasm of lower-outer quadrant of right female breast: Secondary | ICD-10-CM | POA: Insufficient documentation

## 2022-06-24 DIAGNOSIS — Z79899 Other long term (current) drug therapy: Secondary | ICD-10-CM | POA: Insufficient documentation

## 2022-06-24 DIAGNOSIS — D696 Thrombocytopenia, unspecified: Secondary | ICD-10-CM

## 2022-06-24 DIAGNOSIS — F323 Major depressive disorder, single episode, severe with psychotic features: Secondary | ICD-10-CM

## 2022-06-24 DIAGNOSIS — E876 Hypokalemia: Secondary | ICD-10-CM | POA: Diagnosis not present

## 2022-06-24 DIAGNOSIS — C50812 Malignant neoplasm of overlapping sites of left female breast: Secondary | ICD-10-CM | POA: Insufficient documentation

## 2022-06-24 DIAGNOSIS — Z923 Personal history of irradiation: Secondary | ICD-10-CM | POA: Diagnosis not present

## 2022-06-24 DIAGNOSIS — Z9013 Acquired absence of bilateral breasts and nipples: Secondary | ICD-10-CM | POA: Insufficient documentation

## 2022-06-24 DIAGNOSIS — C50412 Malignant neoplasm of upper-outer quadrant of left female breast: Secondary | ICD-10-CM

## 2022-06-24 LAB — COMPREHENSIVE METABOLIC PANEL
ALT: 15 U/L (ref 0–44)
AST: 24 U/L (ref 15–41)
Albumin: 4.1 g/dL (ref 3.5–5.0)
Alkaline Phosphatase: 65 U/L (ref 38–126)
Anion gap: 5 (ref 5–15)
BUN: 15 mg/dL (ref 8–23)
CO2: 28 mmol/L (ref 22–32)
Calcium: 9.5 mg/dL (ref 8.9–10.3)
Chloride: 107 mmol/L (ref 98–111)
Creatinine, Ser: 0.69 mg/dL (ref 0.44–1.00)
GFR, Estimated: >60 mL/min (ref >60)
Glucose, Bld: 120 mg/dL — ABNORMAL HIGH (ref 70–99)
Potassium: 3.3 mmol/L — ABNORMAL LOW (ref 3.5–5.1)
Sodium: 140 mmol/L (ref 135–145)
Total Bilirubin: 0.6 mg/dL (ref 0.3–1.2)
Total Protein: 7.7 g/dL (ref 6.5–8.1)

## 2022-06-24 LAB — CBC WITH DIFFERENTIAL/PLATELET
Abs Immature Granulocytes: 0.01 10*3/uL (ref 0.00–0.07)
Basophils Absolute: 0 10*3/uL (ref 0.0–0.1)
Basophils Relative: 1 %
Eosinophils Absolute: 0.1 10*3/uL (ref 0.0–0.5)
Eosinophils Relative: 3 %
HCT: 40.4 % (ref 36.0–46.0)
Hemoglobin: 13.4 g/dL (ref 12.0–15.0)
Immature Granulocytes: 0 %
Lymphocytes Relative: 20 %
Lymphs Abs: 0.7 10*3/uL (ref 0.7–4.0)
MCH: 30 pg (ref 26.0–34.0)
MCHC: 33.2 g/dL (ref 30.0–36.0)
MCV: 90.4 fL (ref 80.0–100.0)
Monocytes Absolute: 0.3 10*3/uL (ref 0.1–1.0)
Monocytes Relative: 7 %
Neutro Abs: 2.5 10*3/uL (ref 1.7–7.7)
Neutrophils Relative %: 69 %
Platelets: 66 10*3/uL — ABNORMAL LOW (ref 150–400)
RBC: 4.47 MIL/uL (ref 3.87–5.11)
RDW: 14 % (ref 11.5–15.5)
WBC: 3.6 10*3/uL — ABNORMAL LOW (ref 4.0–10.5)
nRBC: 0 % (ref 0.0–0.2)

## 2022-06-24 MED ORDER — ANASTROZOLE 1 MG PO TABS: 1.0000 mg | ORAL_TABLET | Freq: Every day | ORAL | 1 refills | Status: DC

## 2022-06-24 MED ORDER — VITAMIN B-12 1000 MCG PO TABS: 1000.0000 ug | ORAL_TABLET | Freq: Every day | ORAL | 2 refills | Status: DC

## 2022-06-24 MED ORDER — POTASSIUM CHLORIDE CRYS ER 20 MEQ PO TBCR: 20.0000 meq | EXTENDED_RELEASE_TABLET | Freq: Every day | ORAL | 0 refills | Status: DC

## 2022-06-24 NOTE — Assessment & Plan Note (Addendum)
The patient missed her appointment and currently appointment was rescheduled to May 2024.

## 2022-06-24 NOTE — Telephone Encounter (Signed)
Per LOS on 3/26:  Bone marrow biopsy - thrombocytopenia  Follow up 2 weeks after bone marrow biopsy.

## 2022-06-24 NOTE — Assessment & Plan Note (Signed)
Recommend patient to take potassium chloride 20 mEq daily for 3 days.  Prescription sent to pharmacy.

## 2022-06-24 NOTE — Assessment & Plan Note (Addendum)
Etiology unknown.   mild splenomegaly.  However immature platelet fraction is inappropriately normal, indicating decreased marrow production.  I recommend bone marrow biopsy for further evaluation.  Recommend patient to take vitamin B12 supplement 1000 mcg daily.  Prescription sent to pharmacy.

## 2022-06-24 NOTE — Progress Notes (Signed)
Hematology/Oncology Progress note [phone] [phone]       CHIEF COMPLAINTS/REASON FOR VISIT:  bilateral breast cancer, thrombocytopenia, leukopenia  ASSESSMENT & PLAN:   Cancer Staging  Malignant neoplasm of left breast (HCC) Staging form: Breast, AJCC 8th Edition - Pathologic stage from 11/25/2021: Stage IB (pT2, pN1, cM0, G2, ER+, PR+, HER2-) - Signed by Earlie Server, MD on 11/25/2021  Malignant neoplasm of lower-outer quadrant of right breast of female, estrogen receptor positive ([place]) Staging form: Breast, AJCC 8th Edition - Pathologic stage from 11/25/2021: Stage IIIA (pT1c, pN3a, cM0, G2, ER+, PR+, HER2-) - Signed by Earlie Server, MD on 11/25/2021   Malignant neoplasm of left breast ([place]) Bilateral breast invasive lobular carcinoma, s/p bilateral mastectomy.   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  pT2 pN1a, ER +90%, PR +90%, HER2 negative (1+)  # Right Breast invasive lobular carcinoma Grade 2, negative margins, right axillary lymph nodes  30/32 involved with metastatic carcinoma, pT1c pN3a ER +90%, PR +51-90%, HER2 negative (1+)  [name]  She tolerates Arimidex 1mg  daily well. Continue current regimen.    CDK4/5 inhibitor may be challenging due to her baseline thrombocytopenia.  Close monitor clinically.      Abnormal gastrointestinal PET scan The patient missed her appointment and currently appointment was rescheduled to May 2024.  Leukopenia Chronic leukopenia since Oct 2023, predominately lymphocytopenia [name], however, total wbc and lymphocyte remain low monthly after radiation.  Drug  induced leukopenia vs other etiologies.  B12 is at the low normal end.  Normal folate. I recommend bone marrow biopsy for further evaluation.  Malignant neoplasm of lower-outer quadrant of right breast of female, estrogen receptor positive ([place]) See above plan.  Severe major depression, single episode, with psychotic features, mood-congruent ([place]) [name]  She was started on Lexpro in Oct 2023  Thrombocytopenia [name]) Etiology unknown.   mild splenomegaly.  However immature platelet fraction is inappropriately normal, indicating decreased marrow production.  I recommend bone marrow biopsy for further evaluation.  Recommend patient to take vitamin B12 supplement 1000 mcg daily.  Prescription sent to pharmacy.     Hypokalemia Recommend patient to take potassium chloride 20 mEq daily for 3 days.  Prescription sent to pharmacy.   Orders Placed This Encounter  Procedures   CT BONE MARROW BIOPSY & ASPIRATION    Standing Status:   Future    Standing Expiration Date:   06/24/2023    Order Specific Question:   Reason for Exam (SYMPTOM  OR DIAGNOSIS REQUIRED)    Answer:   Thrombocytopenia    Order Specific Question:   Preferred location?    Answer:   [facility]    Order Specific Question:   Radiology Contrast Protocol - do NOT remove file path    Answer:   [email or web address]   Follow up 2 weeks after bone marrow biopsy. [name], questions or concerns.  Earlie Server, MD, PhD [facility] Hematology Oncology 06/24/2022     HISTORY OF PRESENTING ILLNESS:   is a  62 y.o.  female presents for follow up of bilateral breast cancer, thrombocytopenia and leukopenia.  Oncology History  Malignant  neoplasm of left breast ([place])  08/06/2021 Mammogram   08/06/2021, digital bilateral mammogram and ultrasound showed left breast 3:00 mass, 1.7 x 1.7 x 1.7 cm,  ultrasound of the left axillary is negative. 08/21/2021 right breast 1 x 0.7 x 0.8 cm angulated spiculated mass at the right breast 7:00, 5 cm from nipple.  Ultrasound of the right axillary demonstrates 3 abnormal thickened cortex lymph node. 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 Franki Cabot right breast 7:00, invasive mammary carcinoma with lobular features, in situ carcinoma,  ER+, PR+, HER 2-This was found to be concordant by Dr. Franki Cabot. right axilla lymph node +, extracapsular extension.  -This was found to be concordant by Dr. Franki Cabot   08/28/2021 Initial Diagnosis   Malignant neoplasm of upper-outer quadrant of left breast in female, estrogen receptor positive ([place])   08/28/2021 Imaging   CT CHEST, ABDOMEN, AND PELVIS WITH CONTRAST Asymmetric mildly enlarged right axillary nodes. Correlate with biopsy. There are some punctate foci of sclerosis in the included spine that could reflect subtle metastatic disease. Nonobstructing bilateral renal calculi.   08/29/2021 Imaging   BILATERAL BREAST MRI WITH AND WITHOUT CONTRAST 1. 2.5 centimeter enhancing mass in the LOWER OUTER QUADRANT of the RIGHT breast, correlating with known malignancy. 2. At least 4 enlarged RIGHT axillary lymph nodes, correlating well with recently biopsied lymph node showing metastatic disease. 3. 3.4 centimeter enhancing mass in the LEFT breast, with an additional 2.9 centimeters of non mass enhancement posterior to the mass also suspicious for malignancy. This likely correlates with the area calcifications seen mammographically. 4. 5 millimeters satellite nodule along the LATERAL aspect of known malignancy in the LOWER OUTER QUADRANT of the LEFT breast. 5. LEFT axilla is negative for adenopathy.       09/05/2021 Imaging   NUCLEAR MEDICINE  WHOLE BODY BONE SCAN Uptake at L2 which is nonspecific but may be related to advanced degenerative disc and facet disease changes at both L1-L2 and L2-L3; no evidence of osseous metastatic disease by CT. No definite osseous metastatic lesions identified.    Genetic Testing   Negative genetic testing. No pathogenic variants identified on the [facility] CancerNext-Expanded+RNA panel. The report date is 10/03/2021.  The CancerNext-Expanded + RNAinsight gene panel offered by [employer] and includes sequencing and rearrangement analysis for the following 77 genes: IP, ALK, APC*, ATM*, AXIN2, BAP1, BARD1, BLM, BMPR1A, BRCA1*, BRCA2*, BRIP1*, CDC73, CDH1*,CDK4, CDKN1B, CDKN2A, CHEK2*, CTNNA1, DICER1, FANCC, FH, FLCN, GALNT12, KIF1B, LZTR1, MAX, MEN1, MET, MLH1*, MSH2*, MSH3, MSH6*, MUTYH*, NBN, NF1*, NF2, NTHL1, PALB2*, PHOX2B, PMS2*, POT1, PRKAR1A, PTCH1, PTEN*, RAD51C*, RAD51D*,RB1, RECQL, RET, SDHA, SDHAF2, SDHB, SDHC, SDHD, SMAD4, SMARCA4, SMARCB1, SMARCE1, STK11, SUFU, TMEM127, TP53*,TSC1, TSC2, VHL and XRCC2 (sequencing and deletion/duplication); EGFR, EGLN1, HOXB13, KIT, MITF, PDGFRA, POLD1 and POLE (sequencing only); EPCAM and GREM1 (deletion/duplication only).   11/15/2021 Surgery   She underwent left simple mastectomy and right modified mastectomy.   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  pT2 pN1a, ER +90%, PR +90%, HER2 negative (1+)  # Right Breast invasive lobular carcinoma Grade 2, negative margins, right axillary lymph nodes  30/32 involved with metastatic carcinoma, pT1c pN3a ER +90%, PR +51-90%, HER2 negative (  1+)    11/25/2021 Cancer Staging   Staging form: Breast, AJCC 8th Edition - Pathologic stage from 11/25/2021: Stage IB (pT2, pN1, cM0, G2, ER+, PR+, HER2-) - Signed  by Earlie Server, MD on 11/25/2021 Stage prefix: Initial diagnosis Multigene prognostic tests performed: None Histologic grading system: 3 grade system   01/09/2022 - 03/10/2022 Radiation Therapy   Adjuvant breast Radiation.    Malignant neoplasm of lower-outer quadrant of right breast of female, estrogen receptor positive ([place])  08/06/2021 Mammogram   08/06/2021, digital bilateral mammogram and ultrasound showed left breast 3:00 mass, 1.7 x 1.7 x 1.7 cm, ultrasound of the left axillary is negative. 08/21/2021 right breast 1 x 0.7 x 0.8 cm angulated spiculated mass at the right breast 7:00, 5 cm from nipple.  Ultrasound of the right axillary demonstrates 3 abnormal thickened cortex lymph node. 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 Franki Cabot right breast 7:00, invasive mammary carcinoma with lobular features, in situ carcinoma,  ER+, PR+, HER 2-This was found to be concordant by Dr. Franki Cabot. right axilla lymph node +, extracapsular extension.  -This was found to be concordant by Dr. Franki Cabot   08/28/2021 Initial Diagnosis   Malignant neoplasm of lower-outer quadrant of right breast of female, estrogen receptor positive ([place])   08/28/2021 Imaging   CT CHEST, ABDOMEN, AND PELVIS WITH CONTRAST Asymmetric mildly enlarged right axillary nodes. Correlate with biopsy. There are some punctate foci of sclerosis in the included spine that could reflect subtle metastatic disease. Nonobstructing bilateral renal calculi.   08/29/2021 Imaging   BILATERAL BREAST MRI WITH AND WITHOUT CONTRAST 1. 2.5 centimeter enhancing mass in the LOWER OUTER QUADRANT of the RIGHT breast, correlating with known malignancy. 2. At least 4 enlarged RIGHT axillary lymph nodes, correlating well with recently biopsied lymph node showing metastatic  disease. 3. 3.4 centimeter enhancing mass in the LEFT breast, with an additional 2.9 centimeters of non mass enhancement posterior to the mass also suspicious for malignancy. This likely correlates with the area calcifications seen mammographically. 4. 5 millimeters satellite nodule along the LATERAL aspect of known malignancy in the LOWER OUTER QUADRANT of the LEFT breast. 5. LEFT axilla is negative for adenopathy.       09/05/2021 Imaging   NUCLEAR MEDICINE WHOLE BODY BONE SCAN Uptake at L2 which is nonspecific but may be related to advanced degenerative disc and facet disease changes at both L1-L2 and L2-L3; no evidence of osseous metastatic disease by CT. No definite osseous metastatic lesions identified.   09/09/2021 Oncotype testing   [id number] block RIGHT 7:00 5 CM FN; ULTRASOUND-GUIDED BIOPSY Oncotype Dx RS score 22    Genetic Testing   Negative genetic testing. No pathogenic variants identified on the [facility] CancerNext-Expanded+RNA panel. The report date is 10/03/2021.  The CancerNext-Expanded + RNAinsight gene panel offered by [employer] and includes sequencing and rearrangement analysis for the following 77 genes: IP, ALK, APC*, ATM*, AXIN2, BAP1, BARD1, BLM, BMPR1A, BRCA1*, BRCA2*, BRIP1*, CDC73, CDH1*,CDK4, CDKN1B, CDKN2A, CHEK2*, CTNNA1, DICER1, FANCC, FH, FLCN, GALNT12, KIF1B, LZTR1, MAX, MEN1, MET, MLH1*, MSH2*, MSH3, MSH6*, MUTYH*, NBN, NF1*, NF2, NTHL1, PALB2*, PHOX2B, PMS2*, POT1, PRKAR1A, PTCH1, PTEN*, RAD51C*, RAD51D*,RB1, RECQL, RET, SDHA, SDHAF2, SDHB, SDHC, SDHD, SMAD4, SMARCA4, SMARCB1, SMARCE1, STK11, SUFU, TMEM127, TP53*,TSC1, TSC2, VHL and XRCC2 (sequencing and deletion/duplication); EGFR, EGLN1, HOXB13, KIT, MITF, PDGFRA, POLD1 and  POLE (sequencing only); EPCAM and GREM1 (deletion/duplication only).   11/15/2021 Surgery   She underwent left simple mastectomy and right modified mastectomy.   Pathology # Left breast, invasive lobular carcinoma Grade 2, with  back ground 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  pT2 pN1a, ER +90%, PR +90%, HER2 negative (1+)  # Right Breast invasive lobular carcinoma Grade 2, negative margins, right axillary lymph nodes  30/32 involved with metastatic carcinoma, pT1c pN3a ER +90%, PR +51-90%, HER2 negative (1+)    11/25/2021 Cancer Staging   Staging form: Breast, AJCC 8th Edition - Pathologic stage from 11/25/2021: Stage IIIA (pT1c, pN3a, cM0, G2, ER+, PR+, HER2-) - Signed by Earlie Server, MD on 11/25/2021 Stage prefix: Initial diagnosis Multigene prognostic tests performed: None Histologic grading system: 3 grade system   12/10/2021 Imaging   PET scan  1. Interval bilateral mastectomy and right axillary node dissection.No evidence of residual disease in the chest wall, nodal metastases or distant metastases. 2. Focal hypermetabolic activity in the proximal rectum corresponding with an intraluminal polypoid lesion, suspicious for a villous adenoma or early colon cancer. Sigmoidoscopy/colonoscopy recommended unless recently performed   12/10/2021 Imaging   1. Interval bilateral mastectomy and right axillary node dissection.No evidence of residual disease in the chest wall, nodal metastasesor distant metastases. 2. Focal hypermetabolic activity in the proximal rectum corresponding with an intraluminal polypoid lesion, suspicious for a villous adenoma or early colon cancer. Sigmoidoscopy/colonoscopy recommended unless recently performed    She is a poor historian. History of major depression, psychosis, previously on olanzapine and Remeron not currently on any and if not currently following up with psychiatrist. Patient's family history is positive for sister with breast cancer   INTERVAL HISTORY BARAN MEINCKE is a 62 y.o. female who has above history reviewed by me today  presents for follow up visit for management of bilateral breast cancer She has been on Lexapro since Oct 2023.  Take Arimidex 1mg  daily since Dec 2023, she reports some muscle/joint pain but overall manageable.  Patient is here by herself.  She has no new complaints.  She missed her GI appointment and appointment has been rescheduled to May 2024.    Review of Systems  Constitutional:  Negative for appetite change, chills, fatigue and fever.  HENT:   Negative for hearing loss and voice change.   Eyes:  Negative for eye problems.  Respiratory:  Negative for chest tightness and cough.   Cardiovascular:  Negative for chest pain.  Gastrointestinal:  Negative for abdominal distention, abdominal pain and blood in stool.  Endocrine: Negative for hot flashes.  Genitourinary:  Negative for difficulty urinating and frequency.   Musculoskeletal:  Negative for arthralgias.  Skin:  Negative for itching and rash.  Neurological:  Negative for extremity weakness.  Hematological:  Negative for adenopathy.  Psychiatric/Behavioral:  Negative for confusion.     MEDICAL HISTORY:  Past Medical History:  Diagnosis Date   Anxiety    Cancer ([place])    Depression    Diabetes mellitus without complication ([place])    History of high cholesterol 2000   Hyperlipidemia    Hypertension    Patient denies medical problems    Thrombocytopenia ([place])     SURGICAL HISTORY: Past Surgical History:  Procedure Laterality Date   ABDOMINAL HYSTERECTOMY     AXILLARY SENTINEL NODE BIOPSY Left 11/15/2021   Procedure: AXILLARY SENTINEL NODE BIOPSY;  Surgeon: [name], MD;  Location: [facility] ORS;  Service: General;  Laterality: Left;   BREAST BIOPSY Right 08/21/2021   [place] bx 7:00 mass coil clip path pending   BREAST BIOPSY Right 08/21/2021   [place] bx of LN, hydro marker, path pending   BREAST BIOPSY Left 08/21/2021   [place] bx, heart marker, path pending   CESAREAN SECTION     x2   MASTECTOMY MODIFIED RADICAL Right  11/15/2021   Procedure: MASTECTOMY MODIFIED RADICAL;  Surgeon: [name], MD;  Location: [facility] ORS;  Service: General;  Laterality: Right;   NO PAST SURGERIES     TOTAL MASTECTOMY Left 11/15/2021   Procedure: TOTAL MASTECTOMY;  Surgeon: [name], MD;  Location: [facility] ORS;  Service: General;  Laterality: Left;    SOCIAL HISTORY: Social History   Socioeconomic History   Marital status: Married    Spouse name: Not on file   Number of children: Not on file   Years of education: Not on file   Highest education level: Not on file  Occupational History   Not on file  Tobacco Use   Smoking status: Former    Types: Cigarettes    Quit date: 2013    Years since quitting: 11.2    Passive exposure: Current   Smokeless tobacco: Never  Vaping Use   Vaping Use: Never used  Substance and Sexual Activity   Alcohol use: Not Currently    Comment: occasionally   Drug use: No   Sexual activity: Not Currently    Comment: unable to assess   Other Topics Concern   Not on file  Social History Narrative   Not on file   Social Determinants of Health   Financial Resource Strain: Medium Risk (10/10/2021)   Overall Financial Resource Strain (CARDIA)    Difficulty of Paying Living Expenses: Somewhat hard  Food Insecurity: No Food Insecurity (10/10/2021)   Hunger Vital Sign    Worried About Running Out of Food in the Last Year: Never true    Ran Out of Food in the Last Year: Never true  Transportation Needs: Unmet Transportation Needs (01/21/2022)   PRAPARE - Transportation    Lack of Transportation (Medical): Yes    Lack of Transportation (Non-Medical): Yes  Physical Activity: Inactive (10/10/2021)   Exercise Vital Sign    Days of Exercise per Week: 0 days    Minutes of Exercise per Session: 0 min  Stress: Stress Concern Present (10/10/2021)   [employer]    Feeling of Stress : To some extent  Social Connections:  Socially Isolated (10/10/2021)   Social Connection and Isolation Panel [NHANES]    Frequency of Communication with Friends and Family: Once a week    Frequency of Social Gatherings with Friends and Family: Never    Attends Religious Services: Never    Marine scientist or Organizations: No    Attends Music therapist: Never    Marital Status: Married  Human resources officer Violence: Not on file    FAMILY HISTORY: Family History  Problem Relation Age of Onset   Lung cancer Mother    Dementia Mother    Parkinson's disease Father    Cancer Father        unk type   Diabetes Sister    Heart attack Sister    Breast cancer Sister    Cancer Maternal Uncle        unk types   Dementia Maternal Grandmother    Cancer Maternal  Grandmother        unk type   Cancer Other    Dementia Other     ALLERGIES:  is allergic to tylenol [acetaminophen] and aleve [naproxen].  MEDICATIONS:  Current Outpatient Medications  Medication Sig Dispense Refill   ACCU-CHEK GUIDE test strip USE TO CHECK BLOOD SUGAR UP TO 4 TIMES DAILY AS DIRECTED 100 each 0   Accu-Chek Softclix Lancets lancets USE TO CHECK BLOOD SUAGR UP TO 4 TIMES DAILY AS DIRECTED 100 each 0   blood glucose meter kit and supplies KIT Dispense based on patient and insurance preference. Use up to four times daily as directed. 1 each 1   calcium carbonate (OS-CAL) 600 MG TABS tablet Take 2 tablets (1,200 mg total) by mouth daily. 60 tablet 5   cyanocobalamin (VITAMIN B12) 1000 MCG tablet Take 1 tablet (1,000 mcg total) by mouth daily. 30 tablet 2   escitalopram (LEXAPRO) 10 MG tablet Take 1 tablet (10 mg total) by mouth daily. 30 tablet 1   hydrocortisone 2.5 % cream Apply topically 2 (two) times daily. 30 g 0   hydrOXYzine (ATARAX) 10 MG tablet TAKE 1 TABLET BY MOUTH ONCE DAILY AS NEEDED FOR ANXIETY OR  ITCHING 30 tablet 0   metFORMIN (GLUCOPHAGE) 500 MG tablet Take 1 tablet (500 mg total) by mouth daily. 90 tablet 1   potassium  chloride SA (KLOR-CON M) 20 MEQ tablet Take 1 tablet (20 mEq total) by mouth daily. 3 tablet 0   anastrozole (ARIMIDEX) 1 MG tablet Take 1 tablet (1 mg total) by mouth daily. 90 tablet 1   No current facility-administered medications for this visit.     PHYSICAL EXAMINATION: ECOG PERFORMANCE STATUS: 1 - Symptomatic but completely ambulatory Vitals:   06/24/22 1042  BP: (!) 164/93  Pulse: 67  Resp: 18  Temp: (!) 96.8 F (36 C)  SpO2: 98%   Filed Weights   06/24/22 1042  Weight: 162 lb 9.6 oz (73.8 kg)    Physical Exam Constitutional:      General: She is not in acute distress. HENT:     Head: Normocephalic and atraumatic.  Eyes:     General: No scleral icterus. Cardiovascular:     Rate and Rhythm: Normal rate.  Pulmonary:     Effort: Pulmonary effort is normal. No respiratory distress.     Breath sounds: No wheezing.  Abdominal:     General: There is no distension.  Musculoskeletal:        General: No deformity. Normal range of motion.     Cervical back: Normal range of motion and neck supple.  Skin:    Coloration: Skin is not jaundiced.  Neurological:     Mental Status: She is alert and oriented to person, place, and time. Mental status is at baseline.     LABORATORY DATA:  I have reviewed the data as listed    Latest Ref Rng & Units 06/24/2022   10:25 AM 04/29/2022   10:18 AM 03/19/2022    9:55 AM  CBC  WBC 4.0 - 10.5 K/uL 3.6  2.4  2.3   Hemoglobin 12.0 - 15.0 g/dL 13.4  12.0  13.0   Hematocrit 36.0 - 46.0 % 40.4  36.7  39.8   Platelets 150 - 400 K/uL 66  58  62       Latest Ref Rng & Units 06/24/2022   10:25 AM 04/29/2022   10:18 AM 03/19/2022    9:55 AM  CMP  Glucose 70 -  99 mg/dL 120  213  144   BUN 8 - 23 mg/dL 15  17  12    Creatinine 0.44 - 1.00 mg/dL 0.69  0.72  0.61   Sodium 135 - 145 mmol/L 140  138  141   Potassium 3.5 - 5.1 mmol/L 3.3  3.8  3.8   Chloride 98 - 111 mmol/L 107  105  107   CO2 22 - 32 mmol/L 28  25  28    Calcium 8.9 - 10.3  mg/dL 9.5  8.8  9.0   Total Protein 6.5 - 8.1 g/dL 7.7  6.9  7.3   Total Bilirubin 0.3 - 1.2 mg/dL 0.6  0.5  0.5   Alkaline Phos 38 - 126 U/L 65  68  64   AST 15 - 41 U/L 24  26  25    ALT 0 - 44 U/L 15  18  16      RADIOGRAPHIC STUDIES: I have personally reviewed the radiological images as listed and agreed with the findings in the report. No results found.

## 2022-06-24 NOTE — Assessment & Plan Note (Addendum)
[name]  She was started on Lexpro in Oct 2023

## 2022-06-24 NOTE — Assessment & Plan Note (Signed)
See above plan. 

## 2022-06-24 NOTE — Assessment & Plan Note (Addendum)
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  Drug induced leukopenia vs other etiologies.  B12 is at the low normal end.  Normal folate. I recommend bone marrow biopsy for further evaluation.

## 2022-06-24 NOTE — Assessment & Plan Note (Signed)
Bilateral breast invasive lobular carcinoma, s/p bilateral mastectomy.   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  pT2 pN1a, ER +90%, PR +90%, HER2 negative (1+)  # Right Breast invasive lobular carcinoma Grade 2, negative margins, right axillary lymph nodes  30/32 involved with metastatic carcinoma, pT1c pN3a ER +90%, PR +51-90%, HER2 negative (1+)  [name]  She tolerates Arimidex 1mg daily well. Continue current regimen.    CDK4/5 inhibitor may be challenging due to her baseline thrombocytopenia.  Close monitor clinically.     

## 2022-06-26 LAB — CANCER ANTIGEN 15-3: CA 15-3: 19.8 U/mL (ref 0.0–25.0)

## 2022-06-26 LAB — CA 27.29 (SERIAL MONITOR): CA 27.29: 19.8 U/mL (ref 0.0–38.6)

## 2022-06-26 NOTE — Telephone Encounter (Signed)
Pt schedule for biopsy on Mon 4/8 at 9:30a an arrive at 8:30a. Pt aware of appt.   Please schedule MD only approx 2 weeks after procedure and call pt with appt.

## 2022-06-30 ENCOUNTER — Telehealth: Payer: Self-pay

## 2022-06-30 NOTE — Telephone Encounter (Signed)
Lamar Sprinkles will you please schedule MD follow-up 2 weeks after BM biopsy and notify patient of appointment date and time. Bone Marrow Biopsy is scheduled for 07/07/22 at 9:30.

## 2022-06-30 NOTE — Telephone Encounter (Signed)
-----   Message from Earlie Server, MD sent at 06/26/2022  9:00 PM EDT ----- Need MD follow up 2 weeks after BM biopsy thanks.

## 2022-07-01 ENCOUNTER — Other Ambulatory Visit: Payer: Self-pay | Admitting: Internal Medicine

## 2022-07-01 DIAGNOSIS — F419 Anxiety disorder, unspecified: Secondary | ICD-10-CM

## 2022-07-01 DIAGNOSIS — F331 Major depressive disorder, recurrent, moderate: Secondary | ICD-10-CM

## 2022-07-01 NOTE — Telephone Encounter (Signed)
Requested Prescriptions  Pending Prescriptions Disp Refills   escitalopram (LEXAPRO) 10 MG tablet [Pharmacy Med Name: Escitalopram Oxalate 10 MG Oral Tablet] 90 tablet 0    Sig: Take 1 tablet by mouth once daily     Psychiatry:  Antidepressants - SSRI Passed - 07/01/2022  6:52 AM      Passed - Completed PHQ-2 or PHQ-9 in the last 360 days      Passed - Valid encounter within last 6 months    Recent Outpatient Visits           2 months ago [facility] Teodora Medici, DO   3 months ago Type 2 diabetes mellitus with hyperglycemia, without long-term current use of insulin [facility])   [facility] Teodora Medici, DO   5 months ago Type 2 diabetes mellitus with other specified complication, without long-term current use of insulin [facility])   [facility] Teodora Medici, DO       Future Appointments             In 2 weeks Teodora Medici, [facility], [facility]

## 2022-07-04 ENCOUNTER — Other Ambulatory Visit: Payer: Self-pay | Admitting: Internal Medicine

## 2022-07-04 DIAGNOSIS — D696 Thrombocytopenia, unspecified: Secondary | ICD-10-CM

## 2022-07-04 NOTE — Progress Notes (Signed)
Patient for IR Bone Marrow Biopsy on Mon 07/07/2022,  I called and spoke with the patient on the phone and gave pre-procedure instructions. Pt was made aware to be here at 8:30a, NPO after MN prior to procedure as well as driver post procedure/recovery/discharge. Pt stated understanding.  Called 07/04/2022

## 2022-07-07 ENCOUNTER — Ambulatory Visit
Admission: RE | Admit: 2022-07-07 | Discharge: 2022-07-07 | Disposition: A | Discharge status: Home / Self Care | Payer: Medicaid Other | Source: Ambulatory Visit | Attending: Oncology | Admitting: Oncology

## 2022-07-07 ENCOUNTER — Other Ambulatory Visit: Payer: Self-pay

## 2022-07-07 DIAGNOSIS — D696 Thrombocytopenia, unspecified: Secondary | ICD-10-CM

## 2022-07-07 DIAGNOSIS — D61818 Other pancytopenia: Secondary | ICD-10-CM | POA: Insufficient documentation

## 2022-07-07 LAB — CBC WITH DIFFERENTIAL/PLATELET
Abs Immature Granulocytes: 0 10*3/uL (ref 0.00–0.07)
Basophils Absolute: 0 10*3/uL (ref 0.0–0.1)
Basophils Relative: 0 %
Eosinophils Absolute: 0 10*3/uL (ref 0.0–0.5)
Eosinophils Relative: 2 %
HCT: 15.8 % — ABNORMAL LOW (ref 36.0–46.0)
Hemoglobin: 5.1 g/dL — ABNORMAL LOW (ref 12.0–15.0)
Immature Granulocytes: 0 %
Lymphocytes Relative: 20 %
Lymphs Abs: 0.2 10*3/uL — ABNORMAL LOW (ref 0.7–4.0)
MCH: 30.2 pg (ref 26.0–34.0)
MCHC: 32.3 g/dL (ref 30.0–36.0)
MCV: 93.5 fL (ref 80.0–100.0)
Monocytes Absolute: 0.1 10*3/uL (ref 0.1–1.0)
Monocytes Relative: 6 %
Neutro Abs: 0.8 10*3/uL — ABNORMAL LOW (ref 1.7–7.7)
Neutrophils Relative %: 72 %
Platelets: 26 10*3/uL — CL (ref 150–400)
RBC: 1.69 MIL/uL — ABNORMAL LOW (ref 3.87–5.11)
RDW: 13.8 % (ref 11.5–15.5)
WBC: 1.1 10*3/uL — CL (ref 4.0–10.5)
nRBC: 0 % (ref 0.0–0.2)

## 2022-07-07 LAB — GLUCOSE, CAPILLARY: Glucose-Capillary: 144 mg/dL — ABNORMAL HIGH (ref 70–99)

## 2022-07-07 MED ORDER — MIDAZOLAM HCL 5 MG/5ML IJ SOLN
INTRAMUSCULAR | Status: AC | PRN
  Administered 2022-07-07: 1 mg via INTRAVENOUS

## 2022-07-07 MED ORDER — LIDOCAINE HCL (PF) 1 % IJ SOLN
10.0000 mL | Freq: Once | INTRAMUSCULAR | Status: AC
  Administered 2022-07-07: 10 mL via INTRADERMAL

## 2022-07-07 MED ORDER — FENTANYL CITRATE (PF) 100 MCG/2ML IJ SOLN
INTRAMUSCULAR | Status: AC | PRN
  Administered 2022-07-07: 25 ug via INTRAVENOUS
  Administered 2022-07-07: 50 ug via INTRAVENOUS

## 2022-07-07 MED ORDER — MIDAZOLAM HCL 2 MG/2ML IJ SOLN
INTRAMUSCULAR | Status: AC
  Filled 2022-07-07: qty 2

## 2022-07-07 MED ORDER — FENTANYL CITRATE (PF) 100 MCG/2ML IJ SOLN
INTRAMUSCULAR | Status: AC
  Filled 2022-07-07: qty 2

## 2022-07-07 MED ORDER — MIDAZOLAM HCL 2 MG/2ML IJ SOLN
INTRAMUSCULAR | Status: AC | PRN
  Administered 2022-07-07: .5 mg via INTRAVENOUS

## 2022-07-07 MED ORDER — HEPARIN SOD (PORK) LOCK FLUSH 100 UNIT/ML IV SOLN
INTRAVENOUS | Status: AC
  Filled 2022-07-07: qty 5

## 2022-07-07 MED ORDER — SODIUM CHLORIDE 0.9 % IV SOLN: INTRAVENOUS | Status: DC

## 2022-07-07 NOTE — Progress Notes (Signed)
Patient clinically stable post BMB per Dr Fredia Sorrow, tolerated well. Vitals stable pre and post procedure. Denies complaints post procedure. Received Versed 1.5 mg along with Fentanyl 75 mcg IV for procedure. Report given to Berdine Dance RN post procedure/332

## 2022-07-07 NOTE — H&P (Signed)
Chief Complaint: Patient was seen in consultation today for thrombocytopenia  Referring Physician(s): Yu,Zhou  Supervising Physician: Irish LackYamagata, Glenn  Patient Status: [facility] - Out-pt  History of Present Illness: Sharon Woods is a 62 y.o. female with PMH significant for anxiety, breast cancer, depression, diabetes mellitus, and hypertension being seen today for thrombocytopenia. Patient is followed by Dr Cathie HoopsYu from Oncology for both breast cancer and thrombocytopenia. Dr Cathie HoopsYu has referred patient to IR for image-guided bone marrow biopsy to further evaluate thrombocytopenia.  Past Medical History:  Diagnosis Date   Anxiety    Cancer    Depression    Diabetes mellitus without complication    History of high cholesterol 2000   Hyperlipidemia    Hypertension    Patient denies medical problems    Thrombocytopenia     Past Surgical History:  Procedure Laterality Date   ABDOMINAL HYSTERECTOMY     AXILLARY SENTINEL NODE BIOPSY Left 11/15/2021   Procedure: AXILLARY SENTINEL NODE BIOPSY;  Surgeon: Campbell Lernerodenberg, Denny, MD;  Location: [facility] ORS;  Service: General;  Laterality: Left;   BREAST BIOPSY Right 08/21/2021   us bx 7:00 mass coil clip path pending   BREAST BIOPSY Right 08/21/2021   us bx of LN, hydro marker, path pending   BREAST BIOPSY Left 08/21/2021   us bx, heart marker, path pending   CESAREAN SECTION     x2   MASTECTOMY MODIFIED RADICAL Right 11/15/2021   Procedure: MASTECTOMY MODIFIED RADICAL;  Surgeon: Campbell Lernerodenberg, Denny, MD;  Location: [facility] ORS;  Service: General;  Laterality: Right;   NO PAST SURGERIES     TOTAL MASTECTOMY Left 11/15/2021   Procedure: TOTAL MASTECTOMY;  Surgeon: Campbell Lernerodenberg, Denny, MD;  Location: [facility] ORS;  Service: General;  Laterality: Left;    Allergies: Tylenol [acetaminophen] and Aleve [naproxen]  Medications: Prior to Admission medications   Medication Sig Start Date End Date Taking? Authorizing Provider  ACCU-CHEK GUIDE test strip USE TO CHECK  BLOOD SUGAR UP TO 4 TIMES DAILY AS DIRECTED 11/13/21  Yes Rickard PatienceYu, Zhou, MD  Accu-Chek Softclix Lancets lancets USE TO CHECK BLOOD SUAGR UP TO 4 TIMES DAILY AS DIRECTED 12/03/21  Yes Rickard PatienceYu, Zhou, MD  anastrozole (ARIMIDEX) 1 MG tablet Take 1 tablet (1 mg total) by mouth daily. 06/24/22  Yes Rickard PatienceYu, Zhou, MD  blood glucose meter kit and supplies KIT Dispense based on patient and insurance preference. Use up to four times daily as directed. 09/27/21  Yes Rickard PatienceYu, Zhou, MD  calcium carbonate (OS-CAL) 600 MG TABS tablet Take 2 tablets (1,200 mg total) by mouth daily. 04/29/22  Yes Rickard PatienceYu, Zhou, MD  cyanocobalamin (VITAMIN B12) 1000 MCG tablet Take 1 tablet (1,000 mcg total) by mouth daily. 06/24/22  Yes Rickard PatienceYu, Zhou, MD  escitalopram (LEXAPRO) 10 MG tablet Take 1 tablet by mouth once daily 07/01/22  Yes Margarita MailAndrews, Elisabeth, DO  hydrocortisone 2.5 % cream Apply topically 2 (two) times daily. 05/09/22  Yes Kara DiesKaur, Charanpreet, NP  hydrOXYzine (ATARAX) 10 MG tablet TAKE 1 TABLET BY MOUTH ONCE DAILY AS NEEDED FOR ANXIETY OR  ITCHING 06/04/22  Yes Kara DiesKaur, Charanpreet, NP  metFORMIN (GLUCOPHAGE) 500 MG tablet Take 1 tablet (500 mg total) by mouth daily. 01/20/22  Yes Margarita MailAndrews, Elisabeth, DO  potassium chloride SA (KLOR-CON M) 20 MEQ tablet Take 1 tablet (20 mEq total) by mouth daily. 06/24/22  Yes Rickard PatienceYu, Zhou, MD     Family History  Problem Relation Age of Onset   Lung cancer Mother    Dementia Mother  Parkinson's disease Father    Cancer Father        unk type   Diabetes Sister    Heart attack Sister    Breast cancer Sister    Cancer Maternal Uncle        unk types   Dementia Maternal Grandmother    Cancer Maternal Grandmother        unk type   Cancer Other    Dementia Other     Social History   Socioeconomic History   Marital status: Married    Spouse name: Not on file   Number of children: Not on file   Years of education: Not on file   Highest education level: Not on file  Occupational History   Not on file  Tobacco Use    Smoking status: Former    Types: Cigarettes    Quit date: 2013    Years since quitting: 11.2    Passive exposure: Current   Smokeless tobacco: Never  Vaping Use   Vaping Use: Never used  Substance and Sexual Activity   Alcohol use: Not Currently    Comment: occasionally   Drug use: No   Sexual activity: Not Currently    Comment: unable to assess   Other Topics Concern   Not on file  Social History Narrative   Not on file   Social Determinants of Health   Financial Resource Strain: Medium Risk (10/10/2021)   Overall Financial Resource Strain (CARDIA)    Difficulty of Paying Living Expenses: Somewhat hard  Food Insecurity: No Food Insecurity (10/10/2021)   Hunger Vital Sign    Worried About Running Out of Food in the Last Year: Never true    Ran Out of Food in the Last Year: Never true  Transportation Needs: Unmet Transportation Needs (01/21/2022)   PRAPARE - Transportation    Lack of Transportation (Medical): Yes    Lack of Transportation (Non-Medical): Yes  Physical Activity: Inactive (10/10/2021)   Exercise Vital Sign    Days of Exercise per Week: 0 days    Minutes of Exercise per Session: 0 min  Stress: Stress Concern Present (10/10/2021)   [employer] of Occupational Health - Occupational Stress Questionnaire    Feeling of Stress : To some extent  Social Connections: Socially Isolated (10/10/2021)   Social Connection and Isolation Panel [NHANES]    Frequency of Communication with Friends and Family: Once a week    Frequency of Social Gatherings with Friends and Family: Never    Attends Religious Services: Never    Database administrator or Organizations: No    Attends Engineer, structural: Never    Marital Status: Married    Code Status: Full Code  Review of Systems: A 12 point ROS discussed and pertinent positives are indicated in the HPI above.  All other systems are negative.  Review of Systems  Constitutional:  Negative for chills and fever.   Respiratory:  Negative for chest tightness and shortness of breath.   Cardiovascular:  Negative for chest pain and leg swelling.  Gastrointestinal:  Positive for abdominal pain and diarrhea. Negative for nausea and vomiting.  Neurological:  Positive for headaches. Negative for dizziness.  Psychiatric/Behavioral:  Negative for confusion.     Vital Signs: BP (!) 171/86   Pulse 86   Temp 97.9 F (36.6 C) (Oral)   Resp 15   Ht 5\' 1"  (1.549 m)   Wt 167 lb (75.8 kg)   LMP  (LMP Unknown) Comment:  Patient is not a good historian  SpO2 100%   BMI 31.55 kg/m     Physical Exam Vitals reviewed.  Constitutional:      General: She is not in acute distress.    Appearance: She is not ill-appearing.  HENT:     Mouth/Throat:     Mouth: Mucous membranes are moist.  Cardiovascular:     Rate and Rhythm: Normal rate and regular rhythm.     Pulses: Normal pulses.     Heart sounds: Normal heart sounds.  Pulmonary:     Effort: Pulmonary effort is normal.     Breath sounds: Normal breath sounds.  Abdominal:     Palpations: Abdomen is soft.     Tenderness: There is no abdominal tenderness.  Musculoskeletal:     Right lower leg: No edema.     Left lower leg: No edema.  Skin:    General: Skin is warm and dry.  Neurological:     Mental Status: She is alert and oriented to person, place, and time.  Psychiatric:        Mood and Affect: Mood normal.        Behavior: Behavior normal.     Imaging: No results found.  Labs:  CBC: Recent Labs    02/28/22 1111 03/19/22 0955 04/29/22 1018 06/24/22 1025  WBC 3.1* 2.3* 2.4* 3.6*  HGB 13.7 13.0 12.0 13.4  HCT 40.9 39.8 36.7 40.4  PLT 58* 62* 58* 66*    COAGS: No results for input(s): "INR", "APTT" in the last 8760 hours.  BMP: Recent Labs    02/13/22 1051 03/19/22 0955 04/29/22 1018 06/24/22 1025  NA 140 141 138 140  K 3.5 3.8 3.8 3.3*  CL 104 107 105 107  CO2 GLUCOSE 138* 144* 213* 120*  BUN CALCIUM 9.3 9.0 8.8* 9.5  CREATININE 0.66 0.61 0.72 0.69  GFRNONAA >60 >60 >60 >60    LIVER FUNCTION TESTS: Recent Labs    02/13/22 1051 03/19/22 0955 04/29/22 1018 06/24/22 1025  BILITOT 0.8 0.5 0.5 0.6  AST ALT ALKPHOS 58 64 68 65  PROT 7.2 7.3 6.9 7.7  ALBUMIN 3.9 3.9 3.6 4.1    TUMOR MARKERS: No results for input(s): "AFPTM", "CEA", "CA199", "CHROMGRNA" in the last 8760 hours.  Assessment and Plan:  Sharon Woods is a 62 yo female being seen today for image-guided bone marrow biopsy to further evaluate patient's thrombocytopenia. Patient presents today in her usual state of health and is NPO. Case reviewed by Dr Fredia Sorrow and is set to proceed on 07/07/22 with moderate sedation.  Risks and benefits of image-guided bone marrow biopsy was discussed with the patient and/or patient's family including, but not limited to bleeding, infection, damage to adjacent structures or low yield requiring additional tests.  All of the questions were answered and there is agreement to proceed.  Consent signed and in chart.   Thank you for this interesting consult.  I greatly enjoyed meeting Sharon Woods and look forward to participating in their care.  A copy of this report was sent to the requesting provider on this date.  Electronically Signed: Kennieth Francois, PA-C 07/07/2022, 9:27 AM   I spent a total of  30 Minutes   in face to face in clinical consultation, greater than 50% of which was counseling/coordinating care for thrombocytopenia. '

## 2022-07-07 NOTE — Procedures (Signed)
Interventional Radiology Procedure Note  Procedure: CT guided bone marrow aspiration and biopsy  Complications: None  EBL: < 10 mL  Findings: Aspirate and core biopsy performed of bone marrow in right iliac bone.  Plan: Bedrest supine x 1 hrs  Virl Coble T. Karlei Waldo, M.D Pager:  [phone]   

## 2022-07-10 ENCOUNTER — Telehealth: Payer: Self-pay

## 2022-07-10 LAB — SURGICAL PATHOLOGY

## 2022-07-10 NOTE — Telephone Encounter (Signed)
-----   Message from Rickard Patience, MD sent at 07/09/2022  7:40 PM EDT ----- Please ask pathology to add myeloid NGS panel thanks.

## 2022-07-10 NOTE — Telephone Encounter (Signed)
Contacted Amanda at [facility] bone marrow lab to addon Myeloid NGS panel.

## 2022-07-12 ENCOUNTER — Other Ambulatory Visit: Payer: Self-pay | Admitting: Nurse Practitioner

## 2022-07-12 DIAGNOSIS — F419 Anxiety disorder, unspecified: Secondary | ICD-10-CM

## 2022-07-14 NOTE — Telephone Encounter (Signed)
Pt returned the call and it was transferred to [facility].

## 2022-07-14 NOTE — Telephone Encounter (Signed)
Left voicemail to return call. It looks likes she is a patient at [facility] as well. Need to know if she plans to stay there since she has a f/u with them scheduled on 4/22. If so, they need to refill her medications, not Evelene Croon, NP

## 2022-07-14 NOTE — Telephone Encounter (Signed)
I spoke with patient & explained that she does not need two PCP's so I am routing this refill request to Dr. Caralee Ates to see if she would be willing to refill this or contact the patient. I also reminded pt of her next appt with Dr. Caralee Ates on 07/21/22 at 11am.

## 2022-07-16 ENCOUNTER — Encounter (HOSPITAL_COMMUNITY): Payer: Self-pay | Admitting: Oncology

## 2022-07-20 NOTE — Progress Notes (Unsigned)
Established Patient Office Visit  Subjective    Patient ID: Sharon Woods, female    DOB: March 15, 1961  Age: 62 y.o. MRN: [id number]  CC:  No chief complaint on file.   HPI Sharon Woods presents to follow-up on chronic medical conditions. She is here with her son.   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 Per chart review patient is not a candidate for adjuvant IV chemotherapy but was just started on endocrine therapy, now on Arimidex 1 mg -Family history of mother, father and sister with breast cancer  Diabetes, Type 2: -Last A1c 5.4 11/23 -Medications: Metformin 500 mg -Patient is compliant with the above medications and reports no side effects.  -Checking BG at home: 157 fasting -Eye exam: Due -Foot exam: UTD 12/23 -Microalbumin: UTD 10/23 -Statin: No -PNA vaccine: Discuss at follow-up -Denies symptoms of hypoglycemia, polyuria, polydipsia, numbness extremities, foot ulcers/trauma.   HLD: -Medications: Nothing currently -Last lipid panel: Lipid Panel     Component Value Date/Time   CHOL 255 (H) 04/16/2015 0659   TRIG 236 (H) 04/16/2015 0659   HDL 69 04/16/2015 0659   CHOLHDL 3.7 04/16/2015 0659   VLDL 47 (H) 04/16/2015 0659   LDLCALC 139 (H) 04/16/2015 4098    MDD: -Mood status: better -Current treatment: Lexapro 10 mg (increased at LOV). States it helps but hesitant to increase dose even though anxiety still present and causing headaches occasionally  -Satisfied with current treatment?: yes -Symptom severity: moderate  -Duration of current treatment : months -Side effects: no Medication compliance: excellent compliance -Previous psychiatric medications: prozac, seroquel, and zyprexa -patient states that she has been on medications for depression in the past, however her "husband did not want her to be on them" and  she felt like a "zombie" so she discontinued them however she also states that she is under a lot of stress both at home and with her health Depressed mood: yes     05/09/2022   12:56 PM 04/15/2022   11:27 AM 03/04/2022   11:14 AM 01/20/2022    2:14 PM 10/10/2021    3:06 PM  Depression screen PHQ 2/9  Decreased Interest 0 2 0 0 1  Down, Depressed, Hopeless 0 1 0 1 0  PHQ - 2 Score 0 3 0 1 1  Altered sleeping  0 0 2 0  Tired, decreased energy  2 0 2 1  Change in appetite  0 0 2 1  Feeling bad or failure about yourself   0 0 0 0  Trouble concentrating  0 0 0 0  Moving slowly or fidgety/restless  0 0 0 1  Suicidal thoughts  0 0 0 0  PHQ-9 Score  5 0 7 4  Difficult doing work/chores  Not difficult at all Not difficult at all Somewhat difficult Somewhat difficult   Hypokalemia: -Currently on KCl 20 meQ for 3 days   Health maintenance: -Blood work up-to-date -Colon cancer screening due  Outpatient Encounter Medications as of 07/21/2022  Medication Sig   hydrOXYzine (ATARAX) 10 MG tablet TAKE 1 TABLET BY MOUTH ONCE DAILY AS NEEDED FOR ANXIETY OR  ITCHING   ACCU-CHEK GUIDE test strip USE TO CHECK BLOOD SUGAR UP TO 4 TIMES DAILY AS DIRECTED   Accu-Chek Softclix Lancets lancets USE TO CHECK BLOOD SUAGR UP TO 4 TIMES DAILY AS DIRECTED   anastrozole (ARIMIDEX) 1 MG  tablet Take 1 tablet (1 mg total) by mouth daily.   blood glucose meter kit and supplies KIT Dispense based on patient and insurance preference. Use up to four times daily as directed.   calcium carbonate (OS-CAL) 600 MG TABS tablet Take 2 tablets (1,200 mg total) by mouth daily.   cyanocobalamin (VITAMIN B12) 1000 MCG tablet Take 1 tablet (1,000 mcg total) by mouth daily.   escitalopram (LEXAPRO) 10 MG tablet Take 1 tablet by mouth once daily   hydrocortisone 2.5 % cream Apply topically 2 (two) times daily.   metFORMIN (GLUCOPHAGE) 500 MG tablet Take 1 tablet (500 mg total) by mouth daily.   potassium chloride SA (KLOR-CON M) 20  MEQ tablet Take 1 tablet (20 mEq total) by mouth daily.   No facility-administered encounter medications on file as of 07/21/2022.    Past Medical History:  Diagnosis Date   Anxiety    Cancer    Depression    Diabetes mellitus without complication    History of high cholesterol 2000   Hyperlipidemia    Hypertension    Patient denies medical problems    Thrombocytopenia     Past Surgical History:  Procedure Laterality Date   ABDOMINAL HYSTERECTOMY     AXILLARY SENTINEL NODE BIOPSY Left 11/15/2021   Procedure: AXILLARY SENTINEL NODE BIOPSY;  Surgeon: Campbell Lerner, MD;  Location: [facility] ORS;  Service: General;  Laterality: Left;   BREAST BIOPSY Right 08/21/2021   [place] bx 7:00 mass coil clip path pending   BREAST BIOPSY Right 08/21/2021   [place] bx of LN, hydro marker, path pending   BREAST BIOPSY Left 08/21/2021   [place] bx, heart marker, path pending   CESAREAN SECTION     x2   MASTECTOMY MODIFIED RADICAL Right 11/15/2021   Procedure: MASTECTOMY MODIFIED RADICAL;  Surgeon: Campbell Lerner, MD;  Location: [facility] ORS;  Service: General;  Laterality: Right;   NO PAST SURGERIES     TOTAL MASTECTOMY Left 11/15/2021   Procedure: TOTAL MASTECTOMY;  Surgeon: Campbell Lerner, MD;  Location: [facility] ORS;  Service: General;  Laterality: Left;    Family History  Problem Relation Age of Onset   Lung cancer Mother    Dementia Mother    Parkinson's disease Father    Cancer Father        unk type   Diabetes Sister    Heart attack Sister    Breast cancer Sister    Cancer Maternal Uncle        unk types   Dementia Maternal Grandmother    Cancer Maternal Grandmother        unk type   Cancer Other    Dementia Other     Social History   Socioeconomic History   Marital status: Married    Spouse name: Not on file   Number of children: Not on file   Years of education: Not on file   Highest education level: Not on file  Occupational History   Not on file  Tobacco Use   Smoking status:  Former    Types: Cigarettes    Quit date: 2013    Years since quitting: 11.3    Passive exposure: Current   Smokeless tobacco: Never  Vaping Use   Vaping Use: Never used  Substance and Sexual Activity   Alcohol use: Not Currently    Comment: occasionally   Drug use: No   Sexual activity: Not Currently    Comment: unable to assess   Other Topics Concern  Not on file  Social History Narrative   Not on file   Social Determinants of Health   Financial Resource Strain: Medium Risk (10/10/2021)   Overall Financial Resource Strain (CARDIA)    Difficulty of Paying Living Expenses: Somewhat hard  Food Insecurity: No Food Insecurity (10/10/2021)   Hunger Vital Sign    Worried About Running Out of Food in the Last Year: Never true    Ran Out of Food in the Last Year: Never true  Transportation Needs: Unmet Transportation Needs (01/21/2022)   PRAPARE - Administrator, Civil Service (Medical): Yes    Lack of Transportation (Non-Medical): Yes  Physical Activity: Inactive (10/10/2021)   Exercise Vital Sign    Days of Exercise per Week: 0 days    Minutes of Exercise per Session: 0 min  Stress: Stress Concern Present (10/10/2021)   [place] of Occupational Health - Occupational Stress Questionnaire    Feeling of Stress : To some extent  Social Connections: Socially Isolated (10/10/2021)   Social Connection and Isolation Panel [NHANES]    Frequency of Communication with Friends and Family: Once a week    Frequency of Social Gatherings with Friends and Family: Never    Attends Religious Services: Never    Database administrator or Organizations: No    Attends Banker Meetings: Never    Marital Status: Married  Catering manager Violence: Not on file    Review of Systems  Constitutional:  Negative for chills and fever.  Eyes:  Negative for blurred vision.  Respiratory:  Negative for shortness of breath.   Cardiovascular:  Negative for chest pain.   Neurological:  Positive for headaches.  Psychiatric/Behavioral:  The patient is nervous/anxious.       Objective    LMP  (LMP Unknown) Comment: Patient is not a good historian  Physical Exam Constitutional:      Appearance: Normal appearance.  HENT:     Head: Normocephalic and atraumatic.  Eyes:     Conjunctiva/sclera: Conjunctivae normal.  Cardiovascular:     Rate and Rhythm: Normal rate and regular rhythm.     Pulses:          Dorsalis pedis pulses are 2+ on the right side and 2+ on the left side.  Pulmonary:     Effort: Pulmonary effort is normal.     Breath sounds: Normal breath sounds.  Musculoskeletal:     Right foot: Normal range of motion. No deformity, bunion, Charcot foot, foot drop or prominent metatarsal heads.     Left foot: Normal range of motion. No deformity, bunion, Charcot foot, foot drop or prominent metatarsal heads.  Feet:     Right foot:     Protective Sensation: 6 sites tested.  6 sites sensed.     Skin integrity: Skin integrity normal.     Toenail Condition: Right toenails are normal.     Left foot:     Protective Sensation: 6 sites tested.  6 sites sensed.     Skin integrity: Skin integrity normal.     Toenail Condition: Left toenails are normal.  Skin:    General: Skin is warm and dry.  Neurological:     General: No focal deficit present.     Mental Status: She is alert. Mental status is at baseline.  Psychiatric:        Mood and Affect: Mood normal.         Assessment & Plan:   1. Anxiety/Moderate episode  of recurrent major depressive disorder [facility]): Patient stating that anxiety is not controlled but is hesitant to go up on her Lexapro.  Her Lexapro will be kept at the same dose and will be refilled for 10 mg daily.  Hydroxyzine was sent to her pharmacy to take as needed for anxiety and itching.  She will follow-up here in 3 months for recheck.  - escitalopram (LEXAPRO) 10 MG tablet; Take 1 tablet (10 mg total) by mouth daily.   Dispense: 30 tablet; Refill: 1 - hydrOXYzine (ATARAX) 10 MG tablet; Take 1 tablet (10 mg total) by mouth daily as needed for anxiety or itching.  Dispense: 30 tablet; Refill: 0  2. Type 2 diabetes mellitus with hyperglycemia, without long-term current use of insulin (HCC): A1c reviewed from November which was appropriate at 5.4%.  She will continue the metformin 500 mg daily, plan to recheck A1c at follow-up.  3. Bilateral malignant neoplasm of breast in female, unspecified estrogen receptor status, unspecified site of breast [facility]): Following with oncology, note and labs reviewed from 03/19/2022.  She was started on Arimidex and seems to be tolerating it well.  4. Itching: Associated with having radiation, hydrocortisone cream sent to pharmacy.  Discussed how hydroxyzine to help with itching as well.  - hydrocortisone 1 % ointment; Apply 1 Application topically 2 (two) times daily.  Dispense: 30 g; Refill: 0   No follow-ups on file.   Margarita Mail, DO

## 2022-07-21 ENCOUNTER — Encounter (HOSPITAL_COMMUNITY): Payer: Self-pay | Admitting: Oncology

## 2022-07-21 ENCOUNTER — Ambulatory Visit (INDEPENDENT_AMBULATORY_CARE_PROVIDER_SITE_OTHER): Payer: Medicaid Other | Admitting: Internal Medicine

## 2022-07-21 ENCOUNTER — Encounter: Payer: Self-pay | Admitting: Internal Medicine

## 2022-07-21 VITALS — BP 122/80 | HR 82 | Temp 97.7°F | Resp 18 | Ht 61.0 in | Wt 165.2 lb

## 2022-07-21 DIAGNOSIS — E785 Hyperlipidemia, unspecified: Secondary | ICD-10-CM | POA: Diagnosis not present

## 2022-07-21 DIAGNOSIS — E1169 Type 2 diabetes mellitus with other specified complication: Secondary | ICD-10-CM

## 2022-07-21 DIAGNOSIS — C50911 Malignant neoplasm of unspecified site of right female breast: Secondary | ICD-10-CM

## 2022-07-21 DIAGNOSIS — F419 Anxiety disorder, unspecified: Secondary | ICD-10-CM | POA: Diagnosis not present

## 2022-07-21 DIAGNOSIS — F331 Major depressive disorder, recurrent, moderate: Secondary | ICD-10-CM | POA: Diagnosis not present

## 2022-07-21 DIAGNOSIS — C50912 Malignant neoplasm of unspecified site of left female breast: Secondary | ICD-10-CM

## 2022-07-21 MED ORDER — HYDROXYZINE HCL 10 MG PO TABS
10.0000 mg | ORAL_TABLET | Freq: Two times a day (BID) | ORAL | 2 refills | Status: DC | PRN
Start: 2022-07-21 — End: 2022-10-20

## 2022-07-21 MED ORDER — METFORMIN HCL 500 MG PO TABS
500.0000 mg | ORAL_TABLET | Freq: Every day | ORAL | 1 refills | Status: DC
Start: 2022-07-21 — End: 2022-10-20

## 2022-07-21 MED ORDER — ESCITALOPRAM OXALATE 10 MG PO TABS: 10.0000 mg | ORAL_TABLET | Freq: Every day | ORAL | 0 refills | Status: DC

## 2022-07-22 LAB — LIPID PANEL
Cholesterol: 230 mg/dL — ABNORMAL HIGH (ref <200)
HDL: 62 mg/dL (ref >=50)
LDL Cholesterol (Calc): 134 mg/dL (calc) — ABNORMAL HIGH
Non-HDL Cholesterol (Calc): 168 mg/dL (calc) — ABNORMAL HIGH (ref <130)
Total CHOL/HDL Ratio: 3.7 (calc) (ref <5.0)
Triglycerides: 202 mg/dL — ABNORMAL HIGH (ref <150)

## 2022-07-22 LAB — BASIC METABOLIC PANEL
BUN: 19 mg/dL (ref 7–25)
CO2: 30 mmol/L (ref 20–32)
Calcium: 9.8 mg/dL (ref 8.6–10.4)
Chloride: 108 mmol/L (ref 98–110)
Creat: 0.7 mg/dL (ref 0.50–1.05)
Glucose, Bld: 120 mg/dL — ABNORMAL HIGH (ref 65–99)
Potassium: 4.1 mmol/L (ref 3.5–5.3)
Sodium: 144 mmol/L (ref 135–146)

## 2022-07-22 LAB — HEMOGLOBIN A1C
Hgb A1c MFr Bld: 6.2 % of total Hgb — ABNORMAL HIGH (ref <5.7)
Mean Plasma Glucose: 131 mg/dL
eAG (mmol/L): 7.3 mmol/L

## 2022-07-23 ENCOUNTER — Encounter: Payer: Self-pay | Admitting: Oncology

## 2022-07-23 ENCOUNTER — Ambulatory Visit (INDEPENDENT_AMBULATORY_CARE_PROVIDER_SITE_OTHER): Payer: Medicaid Other | Admitting: Gastroenterology

## 2022-07-23 ENCOUNTER — Other Ambulatory Visit: Payer: Self-pay

## 2022-07-23 ENCOUNTER — Inpatient Hospital Stay: Payer: Medicaid Other | Attending: Radiation Oncology | Admitting: Oncology

## 2022-07-23 ENCOUNTER — Inpatient Hospital Stay: Payer: Medicaid Other

## 2022-07-23 ENCOUNTER — Encounter: Payer: Self-pay | Admitting: Gastroenterology

## 2022-07-23 VITALS — BP 149/89 | HR 75 | Temp 96.9°F | Resp 18 | Wt 163.4 lb

## 2022-07-23 VITALS — BP 112/71 | HR 73 | Temp 98.4°F | Ht 62.0 in | Wt 165.2 lb

## 2022-07-23 DIAGNOSIS — Z9013 Acquired absence of bilateral breasts and nipples: Secondary | ICD-10-CM | POA: Diagnosis not present

## 2022-07-23 DIAGNOSIS — D649 Anemia, unspecified: Secondary | ICD-10-CM

## 2022-07-23 DIAGNOSIS — Z79811 Long term (current) use of aromatase inhibitors: Secondary | ICD-10-CM | POA: Insufficient documentation

## 2022-07-23 DIAGNOSIS — Z801 Family history of malignant neoplasm of trachea, bronchus and lung: Secondary | ICD-10-CM | POA: Insufficient documentation

## 2022-07-23 DIAGNOSIS — Z87891 Personal history of nicotine dependence: Secondary | ICD-10-CM | POA: Diagnosis not present

## 2022-07-23 DIAGNOSIS — Z809 Family history of malignant neoplasm, unspecified: Secondary | ICD-10-CM | POA: Diagnosis not present

## 2022-07-23 DIAGNOSIS — R948 Abnormal results of function studies of other organs and systems: Secondary | ICD-10-CM | POA: Diagnosis not present

## 2022-07-23 DIAGNOSIS — Z170 Estrogen receptor positive status [ER+]: Secondary | ICD-10-CM

## 2022-07-23 DIAGNOSIS — R9389 Abnormal findings on diagnostic imaging of other specified body structures: Secondary | ICD-10-CM

## 2022-07-23 DIAGNOSIS — C50511 Malignant neoplasm of lower-outer quadrant of right female breast: Secondary | ICD-10-CM | POA: Diagnosis present

## 2022-07-23 DIAGNOSIS — D72819 Decreased white blood cell count, unspecified: Secondary | ICD-10-CM | POA: Diagnosis not present

## 2022-07-23 DIAGNOSIS — K921 Melena: Secondary | ICD-10-CM

## 2022-07-23 DIAGNOSIS — C50411 Malignant neoplasm of upper-outer quadrant of right female breast: Secondary | ICD-10-CM | POA: Diagnosis present

## 2022-07-23 DIAGNOSIS — F323 Major depressive disorder, single episode, severe with psychotic features: Secondary | ICD-10-CM

## 2022-07-23 DIAGNOSIS — C50412 Malignant neoplasm of upper-outer quadrant of left female breast: Secondary | ICD-10-CM

## 2022-07-23 DIAGNOSIS — D72810 Lymphocytopenia: Secondary | ICD-10-CM

## 2022-07-23 DIAGNOSIS — F329 Major depressive disorder, single episode, unspecified: Secondary | ICD-10-CM | POA: Insufficient documentation

## 2022-07-23 DIAGNOSIS — R161 Splenomegaly, not elsewhere classified: Secondary | ICD-10-CM | POA: Diagnosis not present

## 2022-07-23 DIAGNOSIS — D696 Thrombocytopenia, unspecified: Secondary | ICD-10-CM | POA: Diagnosis not present

## 2022-07-23 DIAGNOSIS — Z79899 Other long term (current) drug therapy: Secondary | ICD-10-CM | POA: Diagnosis not present

## 2022-07-23 LAB — CBC WITH DIFFERENTIAL (CANCER CENTER ONLY)
Abs Immature Granulocytes: 0.01 10*3/uL (ref 0.00–0.07)
Basophils Absolute: 0 10*3/uL (ref 0.0–0.1)
Basophils Relative: 0 %
Eosinophils Absolute: 0.1 10*3/uL (ref 0.0–0.5)
Eosinophils Relative: 2 %
HCT: 35.8 % — ABNORMAL LOW (ref 36.0–46.0)
Hemoglobin: 11.8 g/dL — ABNORMAL LOW (ref 12.0–15.0)
Immature Granulocytes: 0 %
Lymphocytes Relative: 18 %
Lymphs Abs: 0.5 10*3/uL — ABNORMAL LOW (ref 0.7–4.0)
MCH: 29.9 pg (ref 26.0–34.0)
MCHC: 33 g/dL (ref 30.0–36.0)
MCV: 90.6 fL (ref 80.0–100.0)
Monocytes Absolute: 0.2 10*3/uL (ref 0.1–1.0)
Monocytes Relative: 7 %
Neutro Abs: 2 10*3/uL (ref 1.7–7.7)
Neutrophils Relative %: 73 %
Platelet Count: 52 10*3/uL — ABNORMAL LOW (ref 150–400)
RBC: 3.95 MIL/uL (ref 3.87–5.11)
RDW: 14.1 % (ref 11.5–15.5)
WBC Count: 2.7 10*3/uL — ABNORMAL LOW (ref 4.0–10.5)
nRBC: 0 % (ref 0.0–0.2)

## 2022-07-23 LAB — SAMPLE TO BLOOD BANK

## 2022-07-23 LAB — TECHNOLOGIST SMEAR REVIEW: Plt Morphology: DECREASED

## 2022-07-23 LAB — CMP (CANCER CENTER ONLY)
ALT: 17 U/L (ref 0–44)
AST: 23 U/L (ref 15–41)
Albumin: 3.7 g/dL (ref 3.5–5.0)
Alkaline Phosphatase: 60 U/L (ref 38–126)
Anion gap: 7 (ref 5–15)
BUN: 15 mg/dL (ref 8–23)
CO2: 24 mmol/L (ref 22–32)
Calcium: 8.7 mg/dL — ABNORMAL LOW (ref 8.9–10.3)
Chloride: 107 mmol/L (ref 98–111)
Creatinine: 0.68 mg/dL (ref 0.44–1.00)
GFR, Estimated: >60 mL/min (ref >60)
Glucose, Bld: 152 mg/dL — ABNORMAL HIGH (ref 70–99)
Potassium: 3.6 mmol/L (ref 3.5–5.1)
Sodium: 138 mmol/L (ref 135–145)
Total Bilirubin: 0.7 mg/dL (ref 0.3–1.2)
Total Protein: 6.7 g/dL (ref 6.5–8.1)

## 2022-07-23 MED ORDER — NA SULFATE-K SULFATE-MG SULF 17.5-3.13-1.6 GM/177ML PO SOLN: 354.0000 mL | Freq: Once | ORAL | 0 refills | Status: AC

## 2022-07-23 NOTE — Assessment & Plan Note (Signed)
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  Drug induced leukopenia vs other etiologies.  B12 is at the low normal end.  Normal folate. Stable counts.  Continue observation.

## 2022-07-23 NOTE — Progress Notes (Signed)
Hematology/Oncology Progress note Telephone:(336) [phone] Fax:(336) [phone]       CHIEF COMPLAINTS/REASON FOR VISIT:  bilateral breast cancer, thrombocytopenia, leukopenia  ASSESSMENT & PLAN:   Cancer Staging  Malignant neoplasm of left breast Staging form: Breast, AJCC 8th Edition - Pathologic stage from 11/25/2021: Stage IB (pT2, pN1, cM0, G2, ER+, PR+, HER2-) - Signed by Rickard Patience, MD on 11/25/2021  Malignant neoplasm of lower-outer quadrant of right breast of female, estrogen receptor positive Staging form: Breast, AJCC 8th Edition - Pathologic stage from 11/25/2021: Stage IIIA (pT1c, pN3a, cM0, G2, ER+, PR+, HER2-) - Signed by Rickard Patience, MD on 11/25/2021   Malignant neoplasm of left breast (HCC) Bilateral breast invasive lobular carcinoma, s/p bilateral mastectomy.   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  pT2 pN1a, ER +90%, PR +90%, HER2 negative (1+)  # Right Breast invasive lobular carcinoma Grade 2, negative margins, right axillary lymph nodes  30/32 involved with metastatic carcinoma, pT1c pN3a ER +90%, PR +51-90%, HER2 negative (1+)  [name]  She tolerates Arimidex  daily well. Continue current regimen.    [name]  Close monitor clinically.      Malignant neoplasm of lower-outer quadrant of right breast of female, estrogen receptor positive (HCC) See above plan.  Severe major depression, single episode, with psychotic features, mood-congruent (HCC) [name]  On Lexpro since Oct 2023  Thrombocytopenia [facility]) Etiology unknown.   mild splenomegaly.   Bone marrow biopsy was reviewed and discussed with patient.  Mildly hypercellular bone marrow with otherwise orderly trilineage hematopoiesis.  a definitive morphologic etiology for this pancytopenia is not noted.  Negative cytogenetics, negative myeloid FISH testing. Repeat lab work showed stable thrombocytopenia.  Continue to monitor.    Abnormal gastrointestinal PET scan [name]  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  Drug induced leukopenia vs other etiologies.  B12 is at the low normal end.  Normal folate. Stable counts.  Continue observation.    Orders Placed This Encounter  Procedures   DG Bone Density    Standing Status:   Future    Standing Expiration Date:   07/23/2023    Order Specific Question:   Reason for Exam (SYMPTOM  OR DIAGNOSIS REQUIRED)    Answer:   Breast cancer    Order Specific Question:   Preferred imaging location?    Answer:   [facility]   CMP (Cancer Center only)    Standing Status:   Future    Number of Occurrences:   1    Standing Expiration Date:   07/23/2023   CBC with Differential (Cancer Center Only)    Standing Status:   Future    Standing Expiration Date:   07/23/2023   CMP (Cancer Center only)    Standing Status:   Future    Standing Expiration Date:   07/23/2023   Cancer antigen 27.29    Standing Status:   Future    Standing Expiration Date:   07/23/2023   Cancer antigen 15-3    Standing  Status:   Future    Standing Expiration Date:   07/23/2023   Follow up 3 months [name], questions or concerns.  Rickard Patience, MD, PhD [facility] Hematology Oncology 07/23/2022     HISTORY OF PRESENTING ILLNESS:   is a  62 y.o.  female presents for  follow up of bilateral breast cancer, thrombocytopenia and leukopenia.  Oncology History  Malignant neoplasm of left breast  08/06/2021 Mammogram   08/06/2021, digital bilateral mammogram and ultrasound showed left breast 3:00 mass, 1.7 x 1.7 x 1.7 cm, ultrasound of the left axillary is negative. 08/21/2021 right breast 1 x 0.7 x 0.8 cm angulated spiculated mass at the right breast 7:00, 5 cm from nipple.  Ultrasound of the right axillary demonstrates 3 abnormal thickened cortex lymph node. 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 Bary Richard right breast 7:00, invasive mammary carcinoma with lobular features, in situ carcinoma,  ER+, PR+, HER 2-This was found to be concordant by Dr. Bary Richard. right axilla lymph node +, extracapsular extension.  -This was found to be concordant by Dr. Bary Richard   08/28/2021 Initial Diagnosis   Malignant neoplasm of upper-outer quadrant of left breast in female, estrogen receptor positive (HCC)   08/28/2021 Imaging   CT CHEST, ABDOMEN, AND PELVIS WITH CONTRAST Asymmetric mildly enlarged right axillary nodes. Correlate with biopsy. There are some punctate foci of sclerosis in the included spine that could reflect subtle metastatic disease. Nonobstructing bilateral renal calculi.   08/29/2021 Imaging   BILATERAL BREAST MRI WITH AND WITHOUT CONTRAST 1. 2.5 centimeter enhancing mass in the LOWER OUTER QUADRANT of the RIGHT breast, correlating with known malignancy. 2. At least 4 enlarged RIGHT axillary lymph nodes, correlating well with recently biopsied lymph node showing metastatic disease. 3. 3.4 centimeter enhancing mass in the LEFT breast, with an additional 2.9 centimeters of non mass enhancement posterior to the mass also suspicious for malignancy. This likely correlates with the area  calcifications seen mammographically. 4. 5 millimeters satellite nodule along the LATERAL aspect of known malignancy in the LOWER OUTER QUADRANT of the LEFT breast. 5. LEFT axilla is negative for adenopathy.       09/05/2021 Imaging   NUCLEAR MEDICINE WHOLE BODY BONE SCAN Uptake at L2 which is nonspecific but may be related to advanced degenerative disc and facet disease changes at both L1-L2 and L2-L3; no evidence of osseous metastatic disease by CT. No definite osseous metastatic lesions identified.    Genetic Testing   Negative genetic testing. No pathogenic variants identified on the [facility] CancerNext-Expanded+RNA panel. The report date is 10/03/2021.  The CancerNext-Expanded + RNAinsight gene panel offered by [employer] and includes sequencing and rearrangement analysis for the following 77 genes: IP, ALK, APC*, ATM*, AXIN2, BAP1, BARD1, BLM, BMPR1A, BRCA1*, BRCA2*, BRIP1*, CDC73, CDH1*,CDK4, CDKN1B, CDKN2A, CHEK2*, CTNNA1, DICER1, FANCC, FH, FLCN, GALNT12, KIF1B, LZTR1, MAX, MEN1, MET, MLH1*, MSH2*, MSH3, MSH6*, MUTYH*, NBN, NF1*, NF2, NTHL1, PALB2*, PHOX2B, PMS2*, POT1, PRKAR1A, PTCH1, PTEN*, RAD51C*, RAD51D*,RB1, RECQL, RET, SDHA, SDHAF2, SDHB, SDHC, SDHD, SMAD4, SMARCA4, SMARCB1, SMARCE1, STK11, SUFU, TMEM127, TP53*,TSC1, TSC2, VHL and XRCC2 (sequencing and deletion/duplication); EGFR, EGLN1, HOXB13, KIT, MITF, PDGFRA, POLD1 and POLE (sequencing only); EPCAM and GREM1 (deletion/duplication only).   11/15/2021 Surgery   She underwent left simple mastectomy  and right modified mastectomy.   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  pT2 pN1a, ER +90%, PR +90%, HER2 negative (1+)  # Right Breast invasive lobular carcinoma Grade 2, negative margins, right axillary lymph nodes   30/32 involved with metastatic carcinoma, pT1c pN3a ER +90%, PR +51-90%, HER2 negative (1+)    11/25/2021 Cancer Staging   Staging form: Breast, AJCC 8th Edition - Pathologic stage from 11/25/2021: Stage IB (pT2, pN1, cM0, G2, ER+, PR+, HER2-) - Signed by Rickard Patience, MD on 11/25/2021 Stage prefix: Initial diagnosis Multigene prognostic tests performed: None Histologic grading system: 3 grade system   01/09/2022 - 03/10/2022 Radiation Therapy   Adjuvant breast Radiation.    Malignant neoplasm of lower-outer quadrant of right breast of female, estrogen receptor positive  08/06/2021 Mammogram   08/06/2021, digital bilateral mammogram and ultrasound showed left breast 3:00 mass, 1.7 x 1.7 x 1.7 cm, ultrasound of the left axillary is negative. 08/21/2021 right breast 1 x 0.7 x 0.8 cm angulated spiculated mass at the right breast 7:00, 5 cm from nipple.  Ultrasound of the right axillary demonstrates 3 abnormal thickened cortex lymph node. 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 Bary Richard right breast 7:00, invasive mammary carcinoma with lobular features, in situ carcinoma,  ER+, PR+, HER 2-This was found to be concordant by Dr. Bary Richard. right axilla lymph node +, extracapsular extension.  -This was found to be concordant by Dr. Bary Richard   08/28/2021 Initial Diagnosis   Malignant neoplasm of lower-outer quadrant of right breast of female, estrogen receptor positive (HCC)   08/28/2021 Imaging   CT CHEST, ABDOMEN, AND PELVIS WITH CONTRAST Asymmetric mildly enlarged right axillary nodes. Correlate with biopsy. There are some punctate foci of sclerosis in the included spine that could reflect subtle metastatic disease. Nonobstructing bilateral renal calculi.   08/29/2021 Imaging   BILATERAL BREAST MRI WITH AND WITHOUT  CONTRAST 1. 2.5 centimeter enhancing mass in the LOWER OUTER QUADRANT of the RIGHT breast, correlating with known malignancy. 2. At least 4 enlarged RIGHT axillary lymph nodes, correlating well with recently biopsied lymph node showing metastatic disease. 3. 3.4 centimeter enhancing mass in the LEFT breast, with an additional 2.9 centimeters of non mass enhancement posterior to the mass also suspicious for malignancy. This likely correlates with the area calcifications seen mammographically. 4. 5 millimeters satellite nodule along the LATERAL aspect of known malignancy in the LOWER OUTER QUADRANT of the LEFT breast. 5. LEFT axilla is negative for adenopathy.       09/05/2021 Imaging   NUCLEAR MEDICINE WHOLE BODY BONE SCAN Uptake at L2 which is nonspecific but may be related to advanced degenerative disc and facet disease changes at both L1-L2 and L2-L3; no evidence of osseous metastatic disease by CT. No definite osseous metastatic lesions identified.   09/09/2021 Oncotype testing   [id number] block RIGHT 7:00 5 CM FN; ULTRASOUND-GUIDED BIOPSY Oncotype Dx RS score 22    Genetic Testing   Negative genetic testing. No pathogenic variants identified on the [facility] CancerNext-Expanded+RNA panel. The report date is 10/03/2021.  The CancerNext-Expanded + RNAinsight gene panel offered by [employer] and includes sequencing and rearrangement analysis for the following 77  genes: IP, ALK, APC*, ATM*, AXIN2, BAP1, BARD1, BLM, BMPR1A, BRCA1*, BRCA2*, BRIP1*, CDC73, CDH1*,CDK4, CDKN1B, CDKN2A, CHEK2*, CTNNA1, DICER1, FANCC, FH, FLCN, GALNT12, KIF1B, LZTR1, MAX, MEN1, MET, MLH1*, MSH2*, MSH3, MSH6*, MUTYH*, NBN, NF1*, NF2, NTHL1, PALB2*, PHOX2B, PMS2*, POT1, PRKAR1A, PTCH1, PTEN*, RAD51C*, RAD51D*,RB1, RECQL, RET, SDHA, SDHAF2, SDHB, SDHC, SDHD, SMAD4, SMARCA4, SMARCB1, SMARCE1, STK11, SUFU, TMEM127, TP53*,TSC1, TSC2, VHL and XRCC2 (sequencing and deletion/duplication); EGFR, EGLN1, HOXB13, KIT, MITF,  PDGFRA, POLD1 and POLE (sequencing only); EPCAM and GREM1 (deletion/duplication only).   11/15/2021 Surgery   She underwent left simple mastectomy and right modified mastectomy.   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  pT2 pN1a, ER +90%, PR +90%, HER2 negative (1+)  # Right Breast invasive lobular carcinoma Grade 2, negative margins, right axillary lymph nodes  30/32 involved with metastatic carcinoma, pT1c pN3a ER +90%, PR +51-90%, HER2 negative (1+)    11/25/2021 Cancer Staging   Staging form: Breast, AJCC 8th Edition - Pathologic stage from 11/25/2021: Stage IIIA (pT1c, pN3a, cM0, G2, ER+, PR+, HER2-) - Signed by Rickard Patience, MD on 11/25/2021 Stage prefix: Initial diagnosis Multigene prognostic tests performed: None Histologic grading system: 3 grade system   12/10/2021 Imaging   PET scan  1. Interval bilateral mastectomy and right axillary node dissection.No evidence of residual disease in the chest wall, nodal metastases or distant metastases. 2. Focal hypermetabolic activity in the proximal rectum corresponding with an intraluminal polypoid lesion, suspicious for a villous adenoma or early colon cancer. Sigmoidoscopy/colonoscopy recommended unless recently performed   12/10/2021 Imaging   1. Interval bilateral mastectomy and right axillary node dissection.No evidence of residual disease in the chest wall, nodal metastasesor distant metastases. 2. Focal hypermetabolic activity in the proximal rectum corresponding with an intraluminal polypoid lesion, suspicious for a villous adenoma or early colon cancer. Sigmoidoscopy/colonoscopy recommended unless recently performed    She is a poor historian. History of major depression, psychosis, previously on olanzapine and Remeron  not currently on any and if not currently following up with psychiatrist. Patient's family history is positive for sister with breast cancer   INTERVAL HISTORY Sharon Woods is a 62 y.o. female who has above history reviewed by me today presents for follow up visit for management of bilateral breast cancer She has been on Lexapro since Oct 2023.  Take Arimidex  daily since Dec 2023, she reports some muscle/joint pain but overall manageable.  Patient has no new complaints.  Status post bone marrow biopsy and presents to discuss results.    Review of Systems  Constitutional:  Negative for appetite change, chills, fatigue and fever.  HENT:   Negative for hearing loss and voice change.   Eyes:  Negative for eye problems.  Respiratory:  Negative for chest tightness and cough.   Cardiovascular:  Negative for chest pain.  Gastrointestinal:  Negative for abdominal distention, abdominal pain and blood in stool.  Endocrine: Negative for hot flashes.  Genitourinary:  Negative for difficulty urinating and frequency.   Musculoskeletal:  Negative for arthralgias.  Skin:  Negative for itching and rash.  Neurological:  Negative for extremity weakness.  Hematological:  Negative for adenopathy.  Psychiatric/Behavioral:  Negative for confusion.     MEDICAL HISTORY:  Past Medical History:  Diagnosis Date   Anxiety    Cancer    Depression    Diabetes mellitus without complication  History of high cholesterol 2000   Hyperlipidemia    Hypertension    Patient denies medical problems    Thrombocytopenia     SURGICAL HISTORY: Past Surgical History:  Procedure Laterality Date   ABDOMINAL HYSTERECTOMY     AXILLARY SENTINEL NODE BIOPSY Left 11/15/2021   Procedure: AXILLARY SENTINEL NODE BIOPSY;  Surgeon: Campbell Lerner, MD;  Location: [facility] ORS;  Service: General;  Laterality: Left;   BREAST BIOPSY Right 08/21/2021   [place] bx 7:00 mass coil clip path pending   BREAST BIOPSY Right 08/21/2021    [place] bx of LN, hydro marker, path pending   BREAST BIOPSY Left 08/21/2021   [place] bx, heart marker, path pending   CESAREAN SECTION     x2   MASTECTOMY MODIFIED RADICAL Right 11/15/2021   Procedure: MASTECTOMY MODIFIED RADICAL;  Surgeon: Campbell Lerner, MD;  Location: [facility] ORS;  Service: General;  Laterality: Right;   NO PAST SURGERIES     TOTAL MASTECTOMY Left 11/15/2021   Procedure: TOTAL MASTECTOMY;  Surgeon: Campbell Lerner, MD;  Location: [facility] ORS;  Service: General;  Laterality: Left;    SOCIAL HISTORY: Social History   Socioeconomic History   Marital status: Married    Spouse name: Not on file   Number of children: Not on file   Years of education: Not on file   Highest education level: Not on file  Occupational History   Not on file  Tobacco Use   Smoking status: Former    Types: Cigarettes    Quit date: 2013    Years since quitting: 11.3    Passive exposure: Current   Smokeless tobacco: Never  Vaping Use   Vaping Use: Never used  Substance and Sexual Activity   Alcohol use: Not Currently    Comment: occasionally   Drug use: No   Sexual activity: Not Currently    Comment: unable to assess   Other Topics Concern   Not on file  Social History Narrative   Not on file   Social Determinants of Health   Financial Resource Strain: Medium Risk (10/10/2021)   Overall Financial Resource Strain (CARDIA)    Difficulty of Paying Living Expenses: Somewhat hard  Food Insecurity: No Food Insecurity (10/10/2021)   Hunger Vital Sign    Worried About Running Out of Food in the Last Year: Never true    Ran Out of Food in the Last Year: Never true  Transportation Needs: Unmet Transportation Needs (01/21/2022)   PRAPARE - Transportation    Lack of Transportation (Medical): Yes    Lack of Transportation (Non-Medical): Yes  Physical Activity: Inactive (10/10/2021)   Exercise Vital Sign    Days of Exercise per Week: 0 days    Minutes of Exercise per Session: 0 min  Stress:  Stress Concern Present (10/10/2021)   [employer] of Occupational Health - Occupational Stress Questionnaire    Feeling of Stress : To some extent  Social Connections: Socially Isolated (10/10/2021)   Social Connection and Isolation Panel [NHANES]    Frequency of Communication with Friends and Family: Once a week    Frequency of Social Gatherings with Friends and Family: Never    Attends Religious Services: Never    Database administrator or Organizations: No    Attends Engineer, structural: Never    Marital Status: Married  Catering manager Violence: Not on file    FAMILY HISTORY: Family History  Problem Relation Age of Onset   Lung cancer Mother  Dementia Mother    Parkinson's disease Father    Cancer Father        unk type   Diabetes Sister    Heart attack Sister    Breast cancer Sister    Cancer Maternal Uncle        unk types   Dementia Maternal Grandmother    Cancer Maternal Grandmother        unk type   Cancer Other    Dementia Other     ALLERGIES:  is allergic to tylenol [acetaminophen] and aleve [naproxen].  MEDICATIONS:  Current Outpatient Medications  Medication Sig Dispense Refill   ACCU-CHEK GUIDE test strip USE TO CHECK BLOOD SUGAR UP TO 4 TIMES DAILY AS DIRECTED 100 each 0   Accu-Chek Softclix Lancets lancets USE TO CHECK BLOOD SUAGR UP TO 4 TIMES DAILY AS DIRECTED 100 each 0   anastrozole (ARIMIDEX) 1 MG tablet Take 1 tablet (1 mg total) by mouth daily. 90 tablet 1   blood glucose meter kit and supplies KIT Dispense based on patient and insurance preference. Use up to four times daily as directed. 1 each 1   calcium carbonate (OS-CAL) 600 MG TABS tablet Take 2 tablets (1,200 mg total) by mouth daily. 60 tablet 5   cyanocobalamin (VITAMIN B12) 1000 MCG tablet Take 1 tablet (1,000 mcg total) by mouth daily. 30 tablet 2   escitalopram (LEXAPRO) 10 MG tablet Take 1 tablet (10 mg total) by mouth daily. 90 tablet 0   hydrocortisone 2.5 % cream  Apply topically 2 (two) times daily. 30 g 0   hydrOXYzine (ATARAX) 10 MG tablet Take 1 tablet (10 mg total) by mouth 2 (two) times daily as needed for itching or anxiety. 30 tablet 2   metFORMIN (GLUCOPHAGE) 500 MG tablet Take 1 tablet (500 mg total) by mouth daily. 90 tablet 1   potassium chloride SA (KLOR-CON M) 20 MEQ tablet Take 1 tablet (20 mEq total) by mouth daily. 3 tablet 0   Na Sulfate-K Sulfate-Mg Sulf 17.5-3.13-1.6 GM/177ML SOLN Take 354 mLs by mouth once for 1 dose. Starting at 5 PM take one bottle and pour into the supplied cup, add cool water to the fill 16 oz line and drink all. Then 5 hours before procedure pour the second bottle into the supplied cup, add cool water to the fill 16 oz line and drink all. 354 mL 0   No current facility-administered medications for this visit.     PHYSICAL EXAMINATION: ECOG PERFORMANCE STATUS: 1 - Symptomatic but completely ambulatory Vitals:   07/23/22 1025  BP: (!) 149/89  Pulse: 75  Resp: 18  Temp: (!) 96.9 F (36.1 C)  SpO2: 98%   Filed Weights   07/23/22 1025  Weight: 163 lb 6.4 oz (74.1 kg)    Physical Exam Constitutional:      General: She is not in acute distress. HENT:     Head: Normocephalic and atraumatic.  Eyes:     General: No scleral icterus. Cardiovascular:     Rate and Rhythm: Normal rate.  Pulmonary:     Effort: Pulmonary effort is normal. No respiratory distress.     Breath sounds: No wheezing.  Abdominal:     General: There is no distension.  Musculoskeletal:        General: No deformity. Normal range of motion.     Cervical back: Normal range of motion and neck supple.  Skin:    Coloration: Skin is not jaundiced.  Neurological:  Mental Status: She is alert and oriented to person, place, and time. Mental status is at baseline.     LABORATORY DATA:  I have reviewed the data as listed    Latest Ref Rng & Units 07/23/2022   10:46 AM 07/07/2022    9:02 AM 06/24/2022   10:25 AM  CBC  WBC 4.0 - 10.5  K/uL 2.7  1.1  3.6   Hemoglobin 12.0 - 15.0 g/dL 16.1  5.1  09.6   Hematocrit 36.0 - 46.0 % 35.8  15.8  40.4   Platelets 150 - 400 K/uL 52  26  66       Latest Ref Rng & Units 07/23/2022   10:46 AM 07/21/2022   11:38 AM 06/24/2022   10:25 AM  CMP  Glucose 70 - 99 mg/dL 045  409  811   BUN 8 - 23 mg/dL 15  19  15    Creatinine 0.44 - 1.00 mg/dL 9.14  7.82  9.56   Sodium 135 - 145 mmol/L 138  144  140   Potassium 3.5 - 5.1 mmol/L 3.6  4.1  3.3   Chloride 98 - 111 mmol/L 107  108  107   CO2 22 - 32 mmol/L 24  30  28    Calcium 8.9 - 10.3 mg/dL 8.7  9.8  9.5   Total Protein 6.5 - 8.1 g/dL 6.7   7.7   Total Bilirubin 0.3 - 1.2 mg/dL 0.7   0.6   Alkaline Phos 38 - 126 U/L 60   65   AST 15 - 41 U/L 23   24   ALT 0 - 44 U/L 17   15     RADIOGRAPHIC STUDIES: I have personally reviewed the radiological images as listed and agreed with the findings in the report. CT BONE MARROW BIOPSY & ASPIRATION  Result Date: 07/07/2022 CLINICAL DATA:  Thrombocytopenia, neutropenia and need for bone marrow biopsy. EXAM: CT GUIDED BONE MARROW ASPIRATION AND BIOPSY ANESTHESIA/SEDATION: Moderate (conscious) sedation was employed during this procedure. A total of Versed 1.5 mg and Fentanyl 75 mcg was administered intravenously by radiology nursing. Moderate Sedation Time: 12 minutes. The patient's level of consciousness and vital signs were monitored continuously by radiology nursing throughout the procedure under my direct supervision. PROCEDURE: The procedure risks, benefits, and alternatives were explained to the patient. Questions regarding the procedure were encouraged and answered. The patient understands and consents to the procedure. A time out was performed prior to initiating the procedure. The right gluteal region was prepped with chlorhexidine. Sterile gown and sterile gloves were used for the procedure. Local anesthesia was provided with 1% Lidocaine. Under CT guidance, an 11 gauge On Control bone cutting  needle was advanced from a posterior approach into the right iliac bone. Needle positioning was confirmed with CT. Initial non heparinized and heparinized aspirate samples were obtained of bone marrow. Core biopsy was performed via the On Control drill needle. COMPLICATIONS: None FINDINGS: Inspection of initial aspirate did reveal visible particles. Intact core biopsy sample was obtained. IMPRESSION: CT guided bone marrow biopsy of right posterior iliac bone with both aspirate and core samples obtained. Electronically Signed   By: Irish Lack M.D.   On: 07/07/2022 11:20

## 2022-07-23 NOTE — Assessment & Plan Note (Signed)
Bilateral breast invasive lobular carcinoma, s/p bilateral mastectomy.   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  pT2 pN1a, ER +90%, PR +90%, HER2 negative (1+)  # Right Breast invasive lobular carcinoma Grade 2, negative margins, right axillary lymph nodes  30/32 involved with metastatic carcinoma, pT1c pN3a ER +90%, PR +51-90%, HER2 negative (1+)  [name]  She tolerates Arimidex  daily well. Continue current regimen.    [name]  Close monitor clinically.

## 2022-07-23 NOTE — Assessment & Plan Note (Signed)
See above plan. 

## 2022-07-23 NOTE — Assessment & Plan Note (Signed)
[name]  On Lexpro since Oct 2023

## 2022-07-23 NOTE — Assessment & Plan Note (Addendum)
Etiology unknown.   mild splenomegaly.  Bone marrow biopsy was reviewed and discussed with patient.  Mildly hypercellular bone marrow with otherwise orderly trilineage hematopoiesis.  a definitive morphologic etiology for this pancytopenia is not noted.  Negative cytogenetics, negative myeloid FISH testing. Repeat lab work showed stable thrombocytopenia.  Continue to monitor.

## 2022-07-23 NOTE — Progress Notes (Unsigned)
Wyline Mood MD, MRCP(U.K) [address]  [address]  [place], [place] [address]  Main: [phone]  Fax: [phone]   Gastroenterology Consultation  Referring Provider:     Kara Dies, NP Primary Care Physician:  Margarita Mail, DO Primary Gastroenterologist:  Dr. Wyline Mood  Reason for Consultation:     Blood in stool         HPI:   Sharon Woods is a 62 y.o. y/o female referred for blood in the tools.  PET scan in 12/18/2021 showed focal hypermetabolic activity in the proximal rectum corresponding with intraluminal polypoid lesion suspicious for villous adenoma early colon cancer sigmoidoscopy or colonoscopy was.  Recommended.  No prior colonoscopy patient has a history of breast cancer. 07/07/2022 hemoglobin 5.1 g, was 13.4 g few weeks prior  She has some blood noticed on her stools.  Last colonoscopy was over 20 years back.  No other complaints in terms of her bowel movements. Past Medical History:  Diagnosis Date   Anxiety    Cancer    Depression    Diabetes mellitus without complication    History of high cholesterol 2000   Hyperlipidemia    Hypertension    Patient denies medical problems    Thrombocytopenia     Past Surgical History:  Procedure Laterality Date   ABDOMINAL HYSTERECTOMY     AXILLARY SENTINEL NODE BIOPSY Left 11/15/2021   Procedure: AXILLARY SENTINEL NODE BIOPSY;  Surgeon: Campbell Lerner, MD;  Location: [facility] ORS;  Service: General;  Laterality: Left;   BREAST BIOPSY Right 08/21/2021   [place] bx 7:00 mass coil clip path pending   BREAST BIOPSY Right 08/21/2021   [place] bx of LN, hydro marker, path pending   BREAST BIOPSY Left 08/21/2021   [place] bx, heart marker, path pending   CESAREAN SECTION     x2   MASTECTOMY MODIFIED RADICAL Right 11/15/2021   Procedure: MASTECTOMY MODIFIED RADICAL;  Surgeon: Campbell Lerner, MD;  Location: [facility] ORS;  Service: General;  Laterality: Right;   NO PAST SURGERIES     TOTAL MASTECTOMY Left 11/15/2021    Procedure: TOTAL MASTECTOMY;  Surgeon: Campbell Lerner, MD;  Location: [facility] ORS;  Service: General;  Laterality: Left;    Prior to Admission medications   Medication Sig Start Date End Date Taking? Authorizing Provider  ACCU-CHEK GUIDE test strip USE TO CHECK BLOOD SUGAR UP TO 4 TIMES DAILY AS DIRECTED 11/13/21   Rickard Patience, MD  Accu-Chek Softclix Lancets lancets USE TO CHECK BLOOD SUAGR UP TO 4 TIMES DAILY AS DIRECTED 12/03/21   Rickard Patience, MD  anastrozole (ARIMIDEX) 1 MG tablet Take 1 tablet (1 mg total) by mouth daily. 06/24/22   Rickard Patience, MD  blood glucose meter kit and supplies KIT Dispense based on patient and insurance preference. Use up to four times daily as directed. 09/27/21   Rickard Patience, MD  calcium carbonate (OS-CAL) 600 MG TABS tablet Take 2 tablets (1,200 mg total) by mouth daily. 04/29/22   Rickard Patience, MD  cyanocobalamin (VITAMIN B12) 1000 MCG tablet Take 1 tablet (1,000 mcg total) by mouth daily. 06/24/22   Rickard Patience, MD  escitalopram (LEXAPRO) 10 MG tablet Take 1 tablet (10 mg total) by mouth daily. 07/21/22   Margarita Mail, DO  hydrocortisone 2.5 % cream Apply topically 2 (two) times daily. 05/09/22   Kara Dies, NP  hydrOXYzine (ATARAX) 10 MG tablet Take 1 tablet (10 mg total) by mouth 2 (two) times daily as needed for itching or  anxiety. 07/21/22   Margarita Mail, DO  metFORMIN (GLUCOPHAGE) 500 MG tablet Take 1 tablet (500 mg total) by mouth daily. 07/21/22   Margarita Mail, DO  potassium chloride SA (KLOR-CON M) 20 MEQ tablet Take 1 tablet (20 mEq total) by mouth daily. 06/24/22   Rickard Patience, MD    Family History  Problem Relation Age of Onset   Lung cancer Mother    Dementia Mother    Parkinson's disease Father    Cancer Father        unk type   Diabetes Sister    Heart attack Sister    Breast cancer Sister    Cancer Maternal Uncle        unk types   Dementia Maternal Grandmother    Cancer Maternal Grandmother        unk type   Cancer Other    Dementia Other       Social History   Tobacco Use   Smoking status: Former    Types: Cigarettes    Quit date: 2013    Years since quitting: 11.3    Passive exposure: Current   Smokeless tobacco: Never  Vaping Use   Vaping Use: Never used  Substance Use Topics   Alcohol use: Not Currently    Comment: occasionally   Drug use: No    Allergies as of 07/23/2022 - Review Complete 07/23/2022  Allergen Reaction Noted   Tylenol [acetaminophen]  10/21/2021   Aleve [naproxen] Rash 10/21/2021    Review of Systems:    All systems reviewed and negative except where noted in HPI.   Physical Exam:  BP 112/71   Pulse 73   Temp 98.4 F (36.9 C)   Ht 5\' 2"  (1.575 m)   Wt 165 lb 3.2 oz (74.9 kg)   LMP  (LMP Unknown) Comment: Patient is not a good historian  BMI 30.22 kg/m  No LMP recorded (lmp unknown). Patient has had a hysterectomy. Psych:  Alert and cooperative. Normal mood and affect. General:   Alert,  Well-developed, well-nourished, pleasant and cooperative in NAD Head:  Normocephalic and atraumatic. Eyes:  Sclera clear, no icterus.   Conjunctiva pink. Neurologic:  Alert and oriented x3;  grossly normal neurologically. Psych:  Alert and cooperative. Normal mood and affect.  Imaging Studies: CT BONE MARROW BIOPSY & ASPIRATION  Result Date: 07/07/2022 CLINICAL DATA:  Thrombocytopenia, neutropenia and need for bone marrow biopsy. EXAM: CT GUIDED BONE MARROW ASPIRATION AND BIOPSY ANESTHESIA/SEDATION: Moderate (conscious) sedation was employed during this procedure. A total of Versed 1.5 mg and Fentanyl 75 mcg was administered intravenously by radiology nursing. Moderate Sedation Time: 12 minutes. The patient's level of consciousness and vital signs were monitored continuously by radiology nursing throughout the procedure under my direct supervision. PROCEDURE: The procedure risks, benefits, and alternatives were explained to the patient. Questions regarding the procedure were encouraged and answered. The  patient understands and consents to the procedure. A time out was performed prior to initiating the procedure. The right gluteal region was prepped with chlorhexidine. Sterile gown and sterile gloves were used for the procedure. Local anesthesia was provided with 1% Lidocaine. Under CT guidance, an 11 gauge On Control bone cutting needle was advanced from a posterior approach into the right iliac bone. Needle positioning was confirmed with CT. Initial non heparinized and heparinized aspirate samples were obtained of bone marrow. Core biopsy was performed via the On Control drill needle. COMPLICATIONS: None FINDINGS: Inspection of initial aspirate did reveal visible particles. Intact  core biopsy sample was obtained. IMPRESSION: CT guided bone marrow biopsy of right posterior iliac bone with both aspirate and core samples obtained. Electronically Signed   By: Irish Lack M.D.   On: 07/07/2022 11:20    Assessment and Plan:   Sharon Woods is a 63 y.o. y/o female with history of breast cancer referred for blood in the stool.  No prior colonoscopy.  Recent CT scan a few months back showed a concerning intraluminal lesion in the proximal rectum.  Hemoglobin a few days back was 5.1 mg.  Plan 1.  Check CBC 2.  Urgent colonoscopy   I have discussed alternative options, risks & benefits,  which include, but are not limited to, bleeding, infection, perforation,respiratory complication & drug reaction.  The patient agrees with this plan & written consent will be obtained.      Follow up in as needed  Dr Wyline Mood MD,MRCP(U.K)

## 2022-07-23 NOTE — Assessment & Plan Note (Signed)
[name]

## 2022-07-24 LAB — CBC WITH DIFFERENTIAL/PLATELET
Basophils Absolute: 0 10*3/uL (ref 0.0–0.2)
Basos: 1 %
EOS (ABSOLUTE): 0.1 10*3/uL (ref 0.0–0.4)
Eos: 2 %
Hematocrit: 34.9 % (ref 34.0–46.6)
Hemoglobin: 11.7 g/dL (ref 11.1–15.9)
Immature Grans (Abs): 0 10*3/uL (ref 0.0–0.1)
Immature Granulocytes: 0 %
Lymphocytes Absolute: 0.7 10*3/uL (ref 0.7–3.1)
Lymphs: 23 %
MCH: 29.8 pg (ref 26.6–33.0)
MCHC: 33.5 g/dL (ref 31.5–35.7)
MCV: 89 fL (ref 79–97)
Monocytes Absolute: 0.2 10*3/uL (ref 0.1–0.9)
Monocytes: 8 %
Neutrophils Absolute: 2 10*3/uL (ref 1.4–7.0)
Neutrophils: 66 %
Platelets: 59 10*3/uL — CL (ref 150–450)
RBC: 3.93 x10E6/uL (ref 3.77–5.28)
RDW: 13.4 % (ref 11.7–15.4)
WBC: 3 10*3/uL — ABNORMAL LOW (ref 3.4–10.8)

## 2022-07-25 ENCOUNTER — Encounter
Admission: RE | Disposition: A | Discharge status: Home / Self Care | Payer: Self-pay | Source: Home / Self Care | Attending: Gastroenterology

## 2022-07-25 ENCOUNTER — Ambulatory Visit: Payer: Medicaid Other | Admitting: Anesthesiology

## 2022-07-25 ENCOUNTER — Ambulatory Visit
Admission: RE | Admit: 2022-07-25 | Discharge: 2022-07-25 | Disposition: A | Discharge status: Home / Self Care | Payer: Medicaid Other | Attending: Gastroenterology | Admitting: Gastroenterology

## 2022-07-25 ENCOUNTER — Encounter: Payer: Self-pay | Admitting: Gastroenterology

## 2022-07-25 DIAGNOSIS — F419 Anxiety disorder, unspecified: Secondary | ICD-10-CM | POA: Insufficient documentation

## 2022-07-25 DIAGNOSIS — Z7984 Long term (current) use of oral hypoglycemic drugs: Secondary | ICD-10-CM | POA: Diagnosis not present

## 2022-07-25 DIAGNOSIS — Z853 Personal history of malignant neoplasm of breast: Secondary | ICD-10-CM | POA: Insufficient documentation

## 2022-07-25 DIAGNOSIS — D128 Benign neoplasm of rectum: Secondary | ICD-10-CM | POA: Insufficient documentation

## 2022-07-25 DIAGNOSIS — K921 Melena: Secondary | ICD-10-CM | POA: Diagnosis not present

## 2022-07-25 DIAGNOSIS — J449 Chronic obstructive pulmonary disease, unspecified: Secondary | ICD-10-CM | POA: Insufficient documentation

## 2022-07-25 DIAGNOSIS — D124 Benign neoplasm of descending colon: Secondary | ICD-10-CM | POA: Insufficient documentation

## 2022-07-25 DIAGNOSIS — I10 Essential (primary) hypertension: Secondary | ICD-10-CM | POA: Insufficient documentation

## 2022-07-25 DIAGNOSIS — E119 Type 2 diabetes mellitus without complications: Secondary | ICD-10-CM | POA: Insufficient documentation

## 2022-07-25 DIAGNOSIS — D649 Anemia, unspecified: Secondary | ICD-10-CM

## 2022-07-25 DIAGNOSIS — F32A Depression, unspecified: Secondary | ICD-10-CM | POA: Diagnosis not present

## 2022-07-25 DIAGNOSIS — Z9013 Acquired absence of bilateral breasts and nipples: Secondary | ICD-10-CM | POA: Insufficient documentation

## 2022-07-25 DIAGNOSIS — R9389 Abnormal findings on diagnostic imaging of other specified body structures: Secondary | ICD-10-CM | POA: Diagnosis not present

## 2022-07-25 DIAGNOSIS — D126 Benign neoplasm of colon, unspecified: Secondary | ICD-10-CM

## 2022-07-25 DIAGNOSIS — C785 Secondary malignant neoplasm of large intestine and rectum: Secondary | ICD-10-CM | POA: Insufficient documentation

## 2022-07-25 DIAGNOSIS — K635 Polyp of colon: Secondary | ICD-10-CM | POA: Insufficient documentation

## 2022-07-25 HISTORY — PX: COLONOSCOPY WITH PROPOFOL: SHX5780

## 2022-07-25 LAB — GLUCOSE, CAPILLARY: Glucose-Capillary: 132 mg/dL — ABNORMAL HIGH (ref 70–99)

## 2022-07-25 SURGERY — COLONOSCOPY WITH PROPOFOL
Anesthesia: General

## 2022-07-25 MED ORDER — PROPOFOL 500 MG/50ML IV EMUL
INTRAVENOUS | Status: DC | PRN
  Administered 2022-07-25: 120 ug/kg/min via INTRAVENOUS

## 2022-07-25 MED ORDER — LIDOCAINE 2% (20 MG/ML) 5 ML SYRINGE
INTRAMUSCULAR | Status: DC | PRN
  Administered 2022-07-25: 20 mg via INTRAVENOUS

## 2022-07-25 MED ORDER — SODIUM CHLORIDE 0.9 % IV SOLN
INTRAVENOUS | Status: DC
  Administered 2022-07-25: 1000 mL via INTRAVENOUS

## 2022-07-25 MED ORDER — PROPOFOL 10 MG/ML IV BOLUS
INTRAVENOUS | Status: DC | PRN
  Administered 2022-07-25: 70 mg via INTRAVENOUS

## 2022-07-25 MED ORDER — STERILE WATER FOR IRRIGATION IR SOLN
Status: DC | PRN
  Administered 2022-07-25: 50 mL

## 2022-07-25 NOTE — Op Note (Signed)
[facility] Gastroenterology Patient Name: Veleria Barnhardt Procedure Date: 07/25/2022 9:35 AM MRN: [id number] Account #: [id number] Date of Birth: 11-13-1960 Admit Type: Outpatient Age: 62 Room: [facility] ENDO ROOM 4 Gender: Female Note Status: Finalized Instrument Name: Prentice Docker [id number] Procedure:             Colonoscopy Indications:           Hematochezia Providers:             Wyline Mood MD, MD Medicines:             Monitored Anesthesia Care Complications:         No immediate complications. Procedure:             Pre-Anesthesia Assessment:                        - Prior to the procedure, a History and Physical was                         performed, and patient medications, allergies and                         sensitivities were reviewed. The patient's tolerance                         of previous anesthesia was reviewed.                        - The risks and benefits of the procedure and the                         sedation options and risks were discussed with the                         patient. All questions were answered and informed                         consent was obtained.                        - ASA Grade Assessment: III - A patient with severe                         systemic disease.                        After obtaining informed consent, the colonoscope was                         passed under direct vision. Throughout the procedure,                         the patient's blood pressure, pulse, and oxygen                         saturations were monitored continuously. The                         Colonoscope was introduced through the anus and  advanced to the the cecum, identified by the                         appendiceal orifice. The colonoscopy was performed                         with ease. The patient tolerated the procedure well.                         The quality of the bowel preparation was good. The                          ileocecal valve, appendiceal orifice, and rectum were                         photographed. Findings:      Six sessile polyps were found in the ascending colon. The polyps were 5       to 6 mm in size. These polyps were removed with a cold snare. Resection       and retrieval were complete.      A 10 mm polyp was found in the descending colon. The polyp was       semi-pedunculated. The polyp was removed with a cold snare. Resection       and retrieval were complete. To prevent bleeding after the polypectomy,       one hemostatic clip was successfully placed. There was no bleeding at       the end of the procedure.      A 30 mm polyp was found in the rectum. The polyp was semi-pedunculated.       The polyp was removed with a hot snare. Resection and retrieval were       complete. To prevent bleeding after the polypectomy, two hemostatic       clips were successfully placed. There was no bleeding at the end of the       procedure.      The exam was otherwise without abnormality on direct and retroflexion       views. Impression:            - Six 5 to 6 mm polyps in the ascending colon, removed                         with a cold snare. Resected and retrieved.                        - One 10 mm polyp in the descending colon, removed                         with a cold snare. Resected and retrieved. Clip was                         placed.                        - One 30 mm polyp in the rectum, removed with a hot                         snare. Resected and retrieved. Clips were placed.                        -  The examination was otherwise normal on direct and                         retroflexion views. Recommendation:        - Discharge patient to home (with escort).                        - Resume previous diet.                        - Continue present medications.                        - Await pathology results.                        - Repeat colonoscopy for surveillance  based on                         pathology results. Procedure Code(s):     --- Professional ---                        [id number], Colonoscopy, flexible; with removal of                         tumor(s), polyp(s), or other lesion(s) by snare                         technique Diagnosis Code(s):     --- Professional ---                        D12.2, Benign neoplasm of ascending colon                        D12.4, Benign neoplasm of descending colon                        D12.8, Benign neoplasm of rectum                        K92.1, Melena (includes Hematochezia) CPT copyright 2022 American Medical Association. All rights reserved. The codes documented in this report are preliminary and upon coder review may  be revised to meet current compliance requirements. Wyline Mood, MD Wyline Mood MD, MD 07/25/2022 10:23:46 AM This report has been signed electronically. Number of Addenda: 0 Note Initiated On: 07/25/2022 9:35 AM Scope Withdrawal Time: 0 hours 20 minutes 2 seconds  Total Procedure Duration: 0 hours 23 minutes 43 seconds  Estimated Blood Loss:  Estimated blood loss: none.      [facility]

## 2022-07-25 NOTE — H&P (Signed)
Wyline Mood, MD [address], [address], [place], [place], [address] [address], [address], [place], [place], [address] Phone: [phone]  Fax: [phone]  Primary Care Physician:  Margarita Mail, DO   Pre-Procedure History & Physical: HPI:  Sharon Woods is a 62 y.o. female is here for an colonoscopy.   Past Medical History:  Diagnosis Date   Anxiety    Cancer (HCC)    Depression    Diabetes mellitus without complication (HCC)    History of high cholesterol 2000   Hyperlipidemia    Hypertension    Patient denies medical problems    Thrombocytopenia (HCC)     Past Surgical History:  Procedure Laterality Date   ABDOMINAL HYSTERECTOMY     AXILLARY SENTINEL NODE BIOPSY Left 11/15/2021   Procedure: AXILLARY SENTINEL NODE BIOPSY;  Surgeon: Campbell Lerner, MD;  Location: [facility] ORS;  Service: General;  Laterality: Left;   BREAST BIOPSY Right 08/21/2021   [place] bx 7:00 mass coil clip path pending   BREAST BIOPSY Right 08/21/2021   [place] bx of LN, hydro marker, path pending   BREAST BIOPSY Left 08/21/2021   [place] bx, heart marker, path pending   CESAREAN SECTION     x2   MASTECTOMY MODIFIED RADICAL Right 11/15/2021   Procedure: MASTECTOMY MODIFIED RADICAL;  Surgeon: Campbell Lerner, MD;  Location: [facility] ORS;  Service: General;  Laterality: Right;   NO PAST SURGERIES     TOTAL MASTECTOMY Left 11/15/2021   Procedure: TOTAL MASTECTOMY;  Surgeon: Campbell Lerner, MD;  Location: [facility] ORS;  Service: General;  Laterality: Left;    Prior to Admission medications   Medication Sig Start Date End Date Taking? Authorizing Provider  ACCU-CHEK GUIDE test strip USE TO CHECK BLOOD SUGAR UP TO 4 TIMES DAILY AS DIRECTED 11/13/21   Rickard Patience, MD  Accu-Chek Softclix Lancets lancets USE TO CHECK BLOOD SUAGR UP TO 4 TIMES DAILY AS DIRECTED 12/03/21   Rickard Patience, MD  anastrozole (ARIMIDEX) 1 MG tablet Take 1 tablet (1 mg total) by mouth daily. 06/24/22   Rickard Patience, MD  blood glucose meter kit and  supplies KIT Dispense based on patient and insurance preference. Use up to four times daily as directed. 09/27/21   Rickard Patience, MD  calcium carbonate (OS-CAL) 600 MG TABS tablet Take 2 tablets (1,200 mg total) by mouth daily. 04/29/22   Rickard Patience, MD  cyanocobalamin (VITAMIN B12) 1000 MCG tablet Take 1 tablet (1,000 mcg total) by mouth daily. 06/24/22   Rickard Patience, MD  escitalopram (LEXAPRO) 10 MG tablet Take 1 tablet (10 mg total) by mouth daily. 07/21/22   Margarita Mail, DO  hydrocortisone 2.5 % cream Apply topically 2 (two) times daily. 05/09/22   Kara Dies, NP  hydrOXYzine (ATARAX) 10 MG tablet Take 1 tablet (10 mg total) by mouth 2 (two) times daily as needed for itching or anxiety. 07/21/22   Margarita Mail, DO  metFORMIN (GLUCOPHAGE) 500 MG tablet Take 1 tablet (500 mg total) by mouth daily. 07/21/22   Margarita Mail, DO  potassium chloride SA (KLOR-CON M) 20 MEQ tablet Take 1 tablet (20 mEq total) by mouth daily. 06/24/22   Rickard Patience, MD    Allergies as of 07/23/2022 - Review Complete 07/23/2022  Allergen Reaction Noted   Tylenol [acetaminophen]  10/21/2021   Aleve [naproxen] Rash 10/21/2021    Family History  Problem Relation Age of Onset   Lung cancer Mother    Dementia Mother    Parkinson's  disease Father    Cancer Father        unk type   Diabetes Sister    Heart attack Sister    Breast cancer Sister    Cancer Maternal Uncle        unk types   Dementia Maternal Grandmother    Cancer Maternal Grandmother        unk type   Cancer Other    Dementia Other     Social History   Socioeconomic History   Marital status: Married    Spouse name: Not on file   Number of children: Not on file   Years of education: Not on file   Highest education level: Not on file  Occupational History   Not on file  Tobacco Use   Smoking status: Former    Types: Cigarettes    Quit date: 2013    Years since quitting: 11.3    Passive exposure: Current   Smokeless tobacco: Never   Vaping Use   Vaping Use: Never used  Substance and Sexual Activity   Alcohol use: Not Currently    Comment: occasionally   Drug use: No   Sexual activity: Not Currently    Comment: unable to assess   Other Topics Concern   Not on file  Social History Narrative   Not on file   Social Determinants of Health   Financial Resource Strain: Medium Risk (10/10/2021)   Overall Financial Resource Strain (CARDIA)    Difficulty of Paying Living Expenses: Somewhat hard  Food Insecurity: No Food Insecurity (10/10/2021)   Hunger Vital Sign    Worried About Running Out of Food in the Last Year: Never true    Ran Out of Food in the Last Year: Never true  Transportation Needs: Unmet Transportation Needs (01/21/2022)   PRAPARE - Transportation    Lack of Transportation (Medical): Yes    Lack of Transportation (Non-Medical): Yes  Physical Activity: Inactive (10/10/2021)   Exercise Vital Sign    Days of Exercise per Week: 0 days    Minutes of Exercise per Session: 0 min  Stress: Stress Concern Present (10/10/2021)   [employer] of Occupational Health - Occupational Stress Questionnaire    Feeling of Stress : To some extent  Social Connections: Socially Isolated (10/10/2021)   Social Connection and Isolation Panel [NHANES]    Frequency of Communication with Friends and Family: Once a week    Frequency of Social Gatherings with Friends and Family: Never    Attends Religious Services: Never    Diplomatic Services operational officer: No    Attends Engineer, structural: Never    Marital Status: Married  Catering manager Violence: Not on file    Review of Systems: See HPI, otherwise negative ROS  Physical Exam: LMP  (LMP Unknown) Comment: Patient is not a good historian General:   Alert,  pleasant and cooperative in NAD Head:  Normocephalic and atraumatic. Neck:  Supple; no masses or thyromegaly. Lungs:  Clear throughout to auscultation, normal respiratory effort.    Heart:   +S1, +S2, Regular rate and rhythm, No edema. Abdomen:  Soft, nontender and nondistended. Normal bowel sounds, without guarding, and without rebound.   Neurologic:  Alert and  oriented x4;  grossly normal neurologically.  Impression/Plan: MANUELLA BLACKSON is here for an colonoscopy to be performed for blood in stool  Risks, benefits, limitations, and alternatives regarding  colonoscopy have been reviewed with the patient.  Questions have been answered.  All parties agreeable.   Wyline Mood, MD  07/25/2022, 8:57 AM

## 2022-07-25 NOTE — Anesthesia Preprocedure Evaluation (Addendum)
Anesthesia Evaluation  Patient identified by MRN, date of birth, ID band Patient awake    Reviewed: Allergy & Precautions, NPO status , Patient's Chart, lab work & pertinent test results  History of Anesthesia Complications (+) Emergence Delirium and history of anesthetic complications  Airway Mallampati: III  TM Distance: <3 FB Neck ROM: full    Dental  (+) Chipped, Poor Dentition, Missing   Pulmonary neg shortness of breath, COPD, Current Smoker and Patient abstained from smoking.   Pulmonary exam normal        Cardiovascular Exercise Tolerance: Good hypertension, (-) angina (-) Past MI Normal cardiovascular exam     Neuro/Psych  PSYCHIATRIC DISORDERS  Depression    negative neurological ROS     GI/Hepatic Neg liver ROS,neg GERD  ,,Blood in stool   Endo/Other  diabetes, Type 2, Oral Hypoglycemic Agents    Renal/GU      Musculoskeletal   Abdominal  (+) + obese  Peds  Hematology Thrombocytopenia Leukopenia   Anesthesia Other Findings  Bilateral breast invasive lobular carcinoma, s/p bilateral mastectomy.    No date: Anxiety No date: Depression No date: Diabetes mellitus without complication (HCC) 2000: History of high cholesterol No date: Hypertension No date: Patient denies medical problems No date: Thrombocytopenia (HCC)  Past Surgical History: No date: ABDOMINAL HYSTERECTOMY 08/21/2021: BREAST BIOPSY; Right     Comment:  [place] bx 7:00 mass coil clip path pending 08/21/2021: BREAST BIOPSY; Right     Comment:  [place] bx of LN, hydro marker, path pending 08/21/2021: BREAST BIOPSY; Left     Comment:  [place] bx, heart marker, path pending No date: CESAREAN SECTION     Comment:  x2 No date: NO PAST SURGERIES     Reproductive/Obstetrics negative OB ROS                             Anesthesia Physical Anesthesia Plan  ASA: 3  Anesthesia Plan: General   Post-op Pain Management:     Induction: Intravenous  PONV Risk Score and Plan: TIVA, Propofol infusion and Treatment may vary due to age or medical condition  Airway Management Planned: Natural Airway  Additional Equipment:   Intra-op Plan:   Post-operative Plan:   Informed Consent: I have reviewed the patients History and Physical, chart, labs and discussed the procedure including the risks, benefits and alternatives for the proposed anesthesia with the patient or authorized representative who has indicated his/her understanding and acceptance.     Dental Advisory Given  Plan Discussed with: Anesthesiologist, CRNA and Surgeon  Anesthesia Plan Comments:         Anesthesia Quick Evaluation

## 2022-07-25 NOTE — Transfer of Care (Signed)
Immediate Anesthesia Transfer of Care Note  Patient: CHONITA GADEA  Procedure(s) Performed: COLONOSCOPY WITH PROPOFOL  Patient Location: Endoscopy Unit  Anesthesia Type:General  Level of Consciousness: drowsy  Airway & Oxygen Therapy: Patient Spontanous Breathing  Post-op Assessment: Report given to RN and Post -op Vital signs reviewed and stable  Post vital signs: Reviewed  Last Vitals:  Vitals Value Taken Time  BP 110/58 07/25/22 1024  Temp    Pulse 81 07/25/22 1025  Resp 13 07/25/22 1025  SpO2 95 % 07/25/22 1025  Vitals shown include unvalidated device data.  Last Pain:  Vitals:   07/25/22 1024  TempSrc:   PainSc: 0-No pain         Complications: No notable events documented.

## 2022-07-28 ENCOUNTER — Encounter: Payer: Self-pay | Admitting: Gastroenterology

## 2022-07-28 NOTE — Anesthesia Postprocedure Evaluation (Signed)
Anesthesia Post Note  Patient: Sharon Woods  Procedure(s) Performed: COLONOSCOPY WITH PROPOFOL  Patient location during evaluation: PACU Anesthesia Type: General Level of consciousness: awake and alert Pain management: pain level controlled Vital Signs Assessment: post-procedure vital signs reviewed and stable Respiratory status: spontaneous breathing, nonlabored ventilation and respiratory function stable Cardiovascular status: blood pressure returned to baseline and stable Postop Assessment: no apparent nausea or vomiting Anesthetic complications: no   No notable events documented.   Last Vitals:  Vitals:   07/25/22 1024 07/25/22 1030  BP: (!) 110/58 (!) 143/81  Pulse: 80   Resp:    Temp:  (!) 35.9 C  SpO2: 95%     Last Pain:  Vitals:   07/27/22 1049  TempSrc:   PainSc: 2                  Foye Deer

## 2022-07-30 ENCOUNTER — Telehealth: Payer: Self-pay

## 2022-07-30 DIAGNOSIS — C50412 Malignant neoplasm of upper-outer quadrant of left female breast: Secondary | ICD-10-CM

## 2022-07-30 NOTE — Telephone Encounter (Signed)
Called and informed pt and the need for PET scan.   Please schedule PET - first avail MD approx 1 week after PET for results.

## 2022-07-30 NOTE — Telephone Encounter (Signed)
-----   Message from Rickard Patience, MD sent at 07/29/2022  7:52 PM EDT ----- Her colon polyp showed metastatic breast cancer.  I recommend PET- restaging. And please arrange her to see me 1 week after PET. Thanks.

## 2022-07-31 ENCOUNTER — Encounter: Payer: Self-pay | Admitting: Gastroenterology

## 2022-07-31 NOTE — Progress Notes (Signed)
Dr Caralee Ates,   I have discussed the results of the pathology with Dr Cathie Hoops her oncologist who has ordered further tests .  Regards  Dr Wyline Mood MD,MRCP [facility]) Gastroenterology/Hepatology Pager: [phone]

## 2022-08-05 ENCOUNTER — Ambulatory Visit: Payer: Self-pay | Admitting: Gastroenterology

## 2022-08-06 ENCOUNTER — Ambulatory Visit
Admission: RE | Admit: 2022-08-06 | Discharge: 2022-08-06 | Disposition: A | Discharge status: Home / Self Care | Payer: Medicaid Other | Source: Ambulatory Visit | Attending: Oncology | Admitting: Oncology

## 2022-08-06 DIAGNOSIS — Z90710 Acquired absence of both cervix and uterus: Secondary | ICD-10-CM | POA: Insufficient documentation

## 2022-08-06 DIAGNOSIS — Z170 Estrogen receptor positive status [ER+]: Secondary | ICD-10-CM

## 2022-08-06 DIAGNOSIS — Z923 Personal history of irradiation: Secondary | ICD-10-CM | POA: Diagnosis not present

## 2022-08-06 DIAGNOSIS — Z9013 Acquired absence of bilateral breasts and nipples: Secondary | ICD-10-CM | POA: Diagnosis not present

## 2022-08-06 DIAGNOSIS — N200 Calculus of kidney: Secondary | ICD-10-CM | POA: Diagnosis not present

## 2022-08-06 DIAGNOSIS — I700 Atherosclerosis of aorta: Secondary | ICD-10-CM | POA: Diagnosis not present

## 2022-08-06 DIAGNOSIS — C50412 Malignant neoplasm of upper-outer quadrant of left female breast: Secondary | ICD-10-CM | POA: Diagnosis present

## 2022-08-06 LAB — GLUCOSE, CAPILLARY: Glucose-Capillary: 120 mg/dL — ABNORMAL HIGH (ref 70–99)

## 2022-08-06 MED ORDER — FLUDEOXYGLUCOSE F - 18 (FDG) INJECTION
8.6400 | Freq: Once | INTRAVENOUS | Status: AC | PRN
  Administered 2022-08-06: 8.64 via INTRAVENOUS

## 2022-08-08 LAB — SURGICAL PATHOLOGY

## 2022-08-14 ENCOUNTER — Encounter: Payer: Self-pay | Admitting: Oncology

## 2022-08-14 ENCOUNTER — Inpatient Hospital Stay: Payer: Medicaid Other | Attending: Radiation Oncology | Admitting: Oncology

## 2022-08-14 ENCOUNTER — Inpatient Hospital Stay: Payer: Medicaid Other

## 2022-08-14 VITALS — BP 126/80 | HR 67 | Temp 97.6°F | Resp 18 | Wt 164.8 lb

## 2022-08-14 DIAGNOSIS — C50412 Malignant neoplasm of upper-outer quadrant of left female breast: Secondary | ICD-10-CM | POA: Diagnosis not present

## 2022-08-14 DIAGNOSIS — Z9013 Acquired absence of bilateral breasts and nipples: Secondary | ICD-10-CM | POA: Diagnosis not present

## 2022-08-14 DIAGNOSIS — Z79811 Long term (current) use of aromatase inhibitors: Secondary | ICD-10-CM | POA: Insufficient documentation

## 2022-08-14 DIAGNOSIS — Z170 Estrogen receptor positive status [ER+]: Secondary | ICD-10-CM | POA: Diagnosis not present

## 2022-08-14 DIAGNOSIS — C50812 Malignant neoplasm of overlapping sites of left female breast: Secondary | ICD-10-CM | POA: Insufficient documentation

## 2022-08-14 DIAGNOSIS — Z7189 Other specified counseling: Secondary | ICD-10-CM

## 2022-08-14 DIAGNOSIS — D696 Thrombocytopenia, unspecified: Secondary | ICD-10-CM | POA: Diagnosis not present

## 2022-08-14 DIAGNOSIS — Z79899 Other long term (current) drug therapy: Secondary | ICD-10-CM | POA: Diagnosis not present

## 2022-08-14 DIAGNOSIS — C50511 Malignant neoplasm of lower-outer quadrant of right female breast: Secondary | ICD-10-CM | POA: Diagnosis not present

## 2022-08-14 DIAGNOSIS — D72810 Lymphocytopenia: Secondary | ICD-10-CM

## 2022-08-14 DIAGNOSIS — D72819 Decreased white blood cell count, unspecified: Secondary | ICD-10-CM | POA: Insufficient documentation

## 2022-08-14 LAB — CBC WITH DIFFERENTIAL (CANCER CENTER ONLY)
Abs Immature Granulocytes: 0 10*3/uL (ref 0.00–0.07)
Basophils Absolute: 0 10*3/uL (ref 0.0–0.1)
Basophils Relative: 1 %
Eosinophils Absolute: 0.1 10*3/uL (ref 0.0–0.5)
Eosinophils Relative: 2 %
HCT: 38.6 % (ref 36.0–46.0)
Hemoglobin: 12.7 g/dL (ref 12.0–15.0)
Immature Granulocytes: 0 %
Lymphocytes Relative: 21 %
Lymphs Abs: 0.8 10*3/uL (ref 0.7–4.0)
MCH: 29.6 pg (ref 26.0–34.0)
MCHC: 32.9 g/dL (ref 30.0–36.0)
MCV: 90 fL (ref 80.0–100.0)
Monocytes Absolute: 0.2 10*3/uL (ref 0.1–1.0)
Monocytes Relative: 6 %
Neutro Abs: 2.6 10*3/uL (ref 1.7–7.7)
Neutrophils Relative %: 70 %
Platelet Count: 66 10*3/uL — ABNORMAL LOW (ref 150–400)
RBC: 4.29 MIL/uL (ref 3.87–5.11)
RDW: 14.4 % (ref 11.5–15.5)
WBC Count: 3.6 10*3/uL — ABNORMAL LOW (ref 4.0–10.5)
nRBC: 0 % (ref 0.0–0.2)

## 2022-08-14 NOTE — Assessment & Plan Note (Signed)
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  Drug induced leukopenia vs other etiologies.  B12 is at the low normal end.  Normal folate. Stable counts.  Continue observation.  

## 2022-08-14 NOTE — Assessment & Plan Note (Signed)
Discussed with patient and husband. They understand that her condition is not curable.  Treatment is with palliative intent.

## 2022-08-14 NOTE — Progress Notes (Signed)
Hematology/Oncology Progress note [phone] [phone]       CHIEF COMPLAINTS/REASON FOR VISIT:  bilateral breast cancer, thrombocytopenia, leukopenia  ASSESSMENT & PLAN:   Cancer Staging  Malignant neoplasm of left breast (HCC) Staging form: Breast, AJCC 8th Edition - Pathologic stage from 11/25/2021: Stage IB (pT2, pN1, cM0, G2, ER+, PR+, HER2-) - Signed by Rickard Patience, MD on 11/25/2021 - Pathologic stage from 07/25/2022: Stage IV (rpTX, pNX, pM1, ER+, PR: Not Assessed, HER2-) - Signed by Rickard Patience, MD on 08/14/2022  Malignant neoplasm of lower-outer quadrant of right breast of female, estrogen receptor positive (HCC) Staging form: Breast, AJCC 8th Edition - Pathologic stage from 11/25/2021: Stage IIIA (pT1c, pN3a, cM0, G2, ER+, PR+, HER2-) - Signed by Rickard Patience, MD on 11/25/2021   Malignant neoplasm of left breast (HCC) Bilateral breast invasive lobular carcinoma, s/p bilateral mastectomy. [name]   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  pT2 pN1a, ER +90%, PR +90%, HER2 negative (1+)  # Right Breast invasive lobular carcinoma Grade 2, negative margins, right axillary lymph nodes  30/32 involved with metastatic carcinoma, pT1c pN3a ER +90%, PR +51-90%, HER2 negative (1+)  Colonosocpy results were reviewed with patient and her husband. 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  PET scan did not show any detectable metastatic disease.  Unclear if she has stage IV disease to start with or has developed recurrence/metastatic disease.  Recommend MRI brain w wo contrast.     She tolerates Arimidex 1mg  daily well. Continue current  regimen.    CDK4/5 inhibitor may be difficult due to her baseline cytopenia.   She is asymptomatic.  Recommend to continue Arimidex 1mg  daily, add Fulvestrant injections.      Malignant neoplasm of lower-outer quadrant of right breast of female, estrogen receptor positive (HCC) See above plan.  Thrombocytopenia (HCC) Etiology unknown.   mild splenomegaly.  Bone marrow biopsy was reviewed and discussed with patient.  Mildly hypercellular bone marrow with otherwise orderly trilineage hematopoiesis.  a definitive morphologic etiology for this pancytopenia is not noted.  Negative cytogenetics, negative myeloid FISH testing. Repeat lab work showed stable thrombocytopenia.  Continue to monitor. Continue B12 supplementation.     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  Drug induced leukopenia vs other etiologies.  B12 is at the low normal end.  Normal folate. Stable counts.  Continue observation.     Orders Placed This Encounter  Procedures   MR Brain W Wo Contrast    Standing Status:   Future    Standing Expiration Date:   08/14/2023    Order Specific Question:   If indicated for the ordered procedure, I authorize the administration of contrast media per Radiology protocol    Answer:   Yes    Order Specific Question:   What is the patient's sedation requirement?    Answer:   No Sedation    Order Specific Question:   Does the patient have a pacemaker or implanted devices?    Answer:   No    Order Specific Question:   Use SRS Protocol?    Answer:   No    Order  Specific Question:   Preferred imaging location?    Answer:   [facility] (table limit - 550lbs)   CMP (Cancer Center only)    Standing Status:   Future    Standing Expiration Date:   08/14/2023   CBC with Differential (Cancer Center Only)    Standing Status:   Future    Standing  Expiration Date:   08/14/2023   Cancer antigen 15-3    Standing Status:   Future    Number of Occurrences:   1    Standing Expiration Date:   08/14/2023   Cancer antigen 27.29    Standing Status:   Future    Number of Occurrences:   1    Standing Expiration Date:   08/14/2023   Vitamin B12    Standing Status:   Future    Number of Occurrences:   1    Standing Expiration Date:   08/14/2023   CBC with Differential (Cancer Center Only)    Standing Status:   Future    Number of Occurrences:   1    Standing Expiration Date:   08/14/2023   Follow up per LOS [name], questions or concerns.  Rickard Patience, MD, PhD [facility] Hematology Oncology 08/14/2022     HISTORY OF PRESENTING ILLNESS:   is a  62 y.o.  female presents for follow up of bilateral breast cancer, thrombocytopenia and leukopenia.  Oncology History  Malignant neoplasm of left breast (HCC)  08/06/2021 Mammogram   08/06/2021, digital bilateral mammogram and ultrasound showed left breast 3:00 mass, 1.7 x 1.7 x 1.7 cm, ultrasound of the left axillary is negative. 08/21/2021 right breast 1 x 0.7 x 0.8 cm angulated spiculated mass at the right breast 7:00, 5 cm from nipple.  Ultrasound of the right axillary demonstrates 3 abnormal thickened cortex lymph node. 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 Bary Richard right breast 7:00, invasive mammary carcinoma with lobular features, in situ carcinoma,  ER+, PR+, HER 2-This was found to be concordant by Dr. Bary Richard. right axilla lymph node +, extracapsular extension.  -This was found to be concordant by Dr. Bary Richard   08/28/2021 Initial Diagnosis   Malignant neoplasm of upper-outer quadrant of left breast in female, estrogen receptor  positive (HCC)   08/28/2021 Imaging   CT CHEST, ABDOMEN, AND PELVIS WITH CONTRAST Asymmetric mildly enlarged right axillary nodes. Correlate with biopsy. There are some punctate foci of sclerosis in the included spine that could reflect subtle metastatic disease. Nonobstructing bilateral renal calculi.   08/29/2021 Imaging   BILATERAL BREAST MRI WITH AND WITHOUT CONTRAST 1. 2.5 centimeter enhancing mass in the LOWER OUTER QUADRANT of the RIGHT breast, correlating with known malignancy. 2. At least 4 enlarged RIGHT axillary lymph nodes, correlating well with recently biopsied lymph node showing metastatic disease. 3. 3.4 centimeter enhancing mass in the LEFT breast, with an additional 2.9 centimeters of non mass enhancement posterior to the mass also suspicious for malignancy. This likely correlates with the area calcifications seen mammographically. 4. 5 millimeters satellite nodule along the LATERAL aspect of known malignancy in the LOWER OUTER QUADRANT of the LEFT breast. 5. LEFT axilla is negative for adenopathy.       09/05/2021 Imaging   NUCLEAR MEDICINE WHOLE  BODY BONE SCAN Uptake at L2 which is nonspecific but may be related to advanced degenerative disc and facet disease changes at both L1-L2 and L2-L3; no evidence of osseous metastatic disease by CT. No definite osseous metastatic lesions identified.    Genetic Testing   Negative genetic testing. No pathogenic variants identified on the [facility] CancerNext-Expanded+RNA panel. The report date is 10/03/2021.  The CancerNext-Expanded + RNAinsight gene panel offered by [employer] and includes sequencing and rearrangement analysis for the following 77 genes: IP, ALK, APC*, ATM*, AXIN2, BAP1, BARD1, BLM, BMPR1A, BRCA1*, BRCA2*, BRIP1*, CDC73, CDH1*,CDK4, CDKN1B, CDKN2A, CHEK2*, CTNNA1, DICER1, FANCC, FH, FLCN, GALNT12, KIF1B, LZTR1, MAX, MEN1, MET, MLH1*, MSH2*, MSH3, MSH6*, MUTYH*, NBN, NF1*, NF2, NTHL1, PALB2*, PHOX2B, PMS2*, POT1, PRKAR1A,  PTCH1, PTEN*, RAD51C*, RAD51D*,RB1, RECQL, RET, SDHA, SDHAF2, SDHB, SDHC, SDHD, SMAD4, SMARCA4, SMARCB1, SMARCE1, STK11, SUFU, TMEM127, TP53*,TSC1, TSC2, VHL and XRCC2 (sequencing and deletion/duplication); EGFR, EGLN1, HOXB13, KIT, MITF, PDGFRA, POLD1 and POLE (sequencing only); EPCAM and GREM1 (deletion/duplication only).   11/15/2021 Surgery   She underwent left simple mastectomy and right modified mastectomy.   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  pT2 pN1a, ER +90%, PR +90%, HER2 negative (1+)  # Right Breast invasive lobular carcinoma Grade 2, negative margins, right axillary lymph nodes  30/32 involved with metastatic carcinoma, pT1c pN3a ER +90%, PR +51-90%, HER2 negative (1+)    11/25/2021 Cancer Staging   Staging form: Breast, AJCC 8th Edition - Pathologic stage from 11/25/2021: Stage IB (pT2, pN1, cM0, G2, ER+, PR+, HER2-) - Signed by Rickard Patience, MD on 11/25/2021 Stage prefix: Initial diagnosis Multigene prognostic tests performed: None Histologic grading system: 3 grade system   01/09/2022 - 03/10/2022 Radiation Therapy   Adjuvant breast Radiation.    07/25/2022 Cancer Staging   Staging form: Breast, AJCC 8th Edition - Pathologic stage from 07/25/2022: Stage IV (rpTX, pNX, pM1, ER+, PR: Not Assessed, HER2-) - Signed by Rickard Patience, MD on 08/14/2022 Stage prefix: Recurrence Multigene prognostic tests performed: None   07/25/2022 Procedure   Colonoscopy showed Findings - Six 5 to 6 mm polyps in the ascending colon, removed with a cold snare. Resected and retrieved. - One 10 mm polyp in the descending colon, removed with a cold snare. Resected and retrieved. Clip was placed. - One 30 mm polyp in the rectum, removed with a hot snare. Resected and retrieved. Clips were  placed.  Pathology showed A.  COLON POLYP X 6, ASCENDING AND CECUM; COLD SNARE: - COLONIC MUCOSA WITH METASTATIC LOBULAR BREAST CARCINOMA (3) - HYPERPLASTIC POLYP (3)  B.  COLON POLYP, DESCENDING; COLD SNARE: - TUBULAR ADENOMA. - NEGATIVE FOR HIGH-GRADE DYSPLASIA AND MALIGNANCY.  C.  RECTUM POLYP; HOT SNARE: - TUBULOVILLOUS ADENOMA. - NEGATIVE FOR HIGH-GRADE DYSPLASIA AND MALIGNANCY.  Comment: The metastatic lobular breast carcinoma is positive for GATA-3 and ER (strong staining in greater than 90% of the tumor cells). HER2 negative (1+)     Malignant neoplasm of lower-outer quadrant of right breast of female, estrogen receptor positive (HCC)  08/06/2021 Mammogram   08/06/2021, digital bilateral mammogram and ultrasound showed left breast 3:00 mass, 1.7 x 1.7 x 1.7 cm, ultrasound of the left axillary is negative. 08/21/2021 right breast 1 x 0.7 x 0.8 cm angulated spiculated mass at the right breast 7:00, 5 cm from nipple.  Ultrasound of the right axillary demonstrates  3 abnormal thickened cortex lymph node. 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 Bary Richard right breast 7:00, invasive mammary carcinoma with lobular features, in situ carcinoma,  ER+, PR+, HER 2-This was found to be concordant by Dr. Bary Richard. right axilla lymph node +, extracapsular extension.  -This was found to be concordant by Dr. Bary Richard   08/28/2021 Initial Diagnosis   Malignant neoplasm of lower-outer quadrant of right breast of female, estrogen receptor positive (HCC)   08/28/2021 Imaging   CT CHEST, ABDOMEN, AND PELVIS WITH CONTRAST Asymmetric mildly enlarged right axillary nodes. Correlate with biopsy. There are some punctate foci of sclerosis in the included spine that could reflect subtle metastatic disease. Nonobstructing  bilateral renal calculi.   08/29/2021 Imaging   BILATERAL BREAST MRI WITH AND WITHOUT CONTRAST 1. 2.5 centimeter enhancing mass in the LOWER OUTER QUADRANT of the RIGHT breast, correlating with known malignancy. 2. At least 4 enlarged RIGHT axillary lymph nodes, correlating well with recently biopsied lymph node showing metastatic disease. 3. 3.4 centimeter enhancing mass in the LEFT breast, with an additional 2.9 centimeters of non mass enhancement posterior to the mass also suspicious for malignancy. This likely correlates with the area calcifications seen mammographically. 4. 5 millimeters satellite nodule along the LATERAL aspect of known malignancy in the LOWER OUTER QUADRANT of the LEFT breast. 5. LEFT axilla is negative for adenopathy.       09/05/2021 Imaging   NUCLEAR MEDICINE WHOLE BODY BONE SCAN Uptake at L2 which is nonspecific but may be related to advanced degenerative disc and facet disease changes at both L1-L2 and L2-L3; no evidence of osseous metastatic disease by CT. No definite osseous metastatic lesions identified.   09/09/2021 Oncotype testing   [id number] block RIGHT 7:00 5 CM FN; ULTRASOUND-GUIDED BIOPSY Oncotype Dx RS score 22    Genetic Testing   Negative genetic testing. No pathogenic variants identified on the [facility] CancerNext-Expanded+RNA panel. The report date is 10/03/2021.  The CancerNext-Expanded + RNAinsight gene panel offered by [employer] and includes sequencing and rearrangement analysis for the following 77 genes: IP, ALK, APC*, ATM*, AXIN2, BAP1, BARD1, BLM, BMPR1A, BRCA1*, BRCA2*, BRIP1*, CDC73, CDH1*,CDK4, CDKN1B, CDKN2A, CHEK2*, CTNNA1, DICER1, FANCC, FH, FLCN, GALNT12, KIF1B, LZTR1, MAX, MEN1, MET, MLH1*, MSH2*, MSH3, MSH6*, MUTYH*, NBN, NF1*, NF2, NTHL1, PALB2*, PHOX2B, PMS2*, POT1, PRKAR1A, PTCH1, PTEN*, RAD51C*, RAD51D*,RB1, RECQL, RET, SDHA, SDHAF2, SDHB, SDHC, SDHD, SMAD4, SMARCA4, SMARCB1, SMARCE1, STK11, SUFU, TMEM127, TP53*,TSC1, TSC2,  VHL and XRCC2 (sequencing and deletion/duplication); EGFR, EGLN1, HOXB13, KIT, MITF, PDGFRA, POLD1 and POLE (sequencing only); EPCAM and GREM1 (deletion/duplication only).   11/15/2021 Surgery   She underwent left simple mastectomy and right modified mastectomy.   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  pT2 pN1a, ER +90%, PR +90%, HER2 negative (1+)  # Right Breast invasive lobular carcinoma Grade 2, negative margins, right axillary lymph nodes  30/32 involved with metastatic carcinoma, pT1c pN3a ER +90%, PR +51-90%, HER2 negative (1+)    11/25/2021 Cancer Staging   Staging form: Breast, AJCC 8th Edition - Pathologic stage from 11/25/2021: Stage IIIA (pT1c, pN3a, cM0, G2, ER+, PR+, HER2-) - Signed by Rickard Patience, MD on 11/25/2021 Stage prefix: Initial diagnosis  Multigene prognostic tests performed: None Histologic grading system: 3 grade system   12/10/2021 Imaging   PET scan  1. Interval bilateral mastectomy and right axillary node dissection.No evidence of residual disease in the chest wall, nodal metastases or distant metastases. 2. Focal hypermetabolic activity in the proximal rectum corresponding with an intraluminal polypoid lesion, suspicious for a villous adenoma or early colon cancer. Sigmoidoscopy/colonoscopy recommended unless recently performed   12/10/2021 Imaging   1. Interval bilateral mastectomy and right axillary node dissection.No evidence of residual disease in the chest wall, nodal metastasesor distant metastases. 2. Focal hypermetabolic activity in the proximal rectum corresponding with an intraluminal polypoid lesion, suspicious for a villous adenoma or early colon cancer. Sigmoidoscopy/colonoscopy recommended unless recently performed    She is a poor  historian. History of major depression, psychosis, previously on olanzapine and Remeron not currently on any and if not currently following up with psychiatrist. Patient's family history is positive for sister with breast cancer   INTERVAL HISTORY Sharon Woods is a 62 y.o. female who has above history reviewed by me today presents for follow up visit for management of bilateral breast cancer She has been on Lexapro since Oct 2023.  Take Arimidex 1mg  daily since Dec 2023, she reports some muscle/joint pain but overall manageable.  Patient has no new complaints. Presents to discuss colonoscopy results.     Review of Systems  Constitutional:  Negative for appetite change, chills, fatigue and fever.  HENT:   Negative for hearing loss and voice change.   Eyes:  Negative for eye problems.  Respiratory:  Negative for chest tightness and cough.   Cardiovascular:  Negative for chest pain.  Gastrointestinal:  Negative for abdominal distention, abdominal pain and blood in stool.  Endocrine: Negative for hot flashes.  Genitourinary:  Negative for difficulty urinating and frequency.   Musculoskeletal:  Negative for arthralgias.  Skin:  Negative for itching and rash.  Neurological:  Negative for extremity weakness.  Hematological:  Negative for adenopathy.  Psychiatric/Behavioral:  Negative for confusion.     MEDICAL HISTORY:  Past Medical History:  Diagnosis Date   Anxiety    Cancer (HCC)    Depression    Diabetes mellitus without complication (HCC)    History of high cholesterol 2000   Hyperlipidemia    Hypertension    Patient denies medical problems    Thrombocytopenia (HCC)     SURGICAL HISTORY: Past Surgical History:  Procedure Laterality Date   ABDOMINAL HYSTERECTOMY     AXILLARY SENTINEL NODE BIOPSY Left 11/15/2021   Procedure: AXILLARY SENTINEL NODE BIOPSY;  Surgeon: Campbell Lerner, MD;  Location: [facility] ORS;  Service: General;  Laterality: Left;   BREAST BIOPSY Right  08/21/2021   [place] bx 7:00 mass coil clip path pending   BREAST BIOPSY Right 08/21/2021   [place] bx of LN, hydro marker, path pending   BREAST BIOPSY Left 08/21/2021   [place] bx, heart marker, path pending   CESAREAN SECTION     x2   COLONOSCOPY WITH PROPOFOL N/A 07/25/2022   Procedure: COLONOSCOPY WITH PROPOFOL;  Surgeon: Wyline Mood, MD;  Location[facility]OSCOPY;  Service: Gastroenterology;  Laterality: N/A;   MASTECTOMY     MASTECTOMY MODIFIED RADICAL Right 11/15/2021   Procedure: MASTECTOMY MODIFIED RADICAL;  Surgeon: Campbell Lerner, MD;  Location: [facility] ORS;  Service: General;  Laterality: Right;   NO PAST SURGERIES     TOTAL MASTECTOMY Left 11/15/2021   Procedure: TOTAL MASTECTOMY;  Surgeon: Campbell Lerner, MD;  Location: [facility]  ORS;  Service: General;  Laterality: Left;    SOCIAL HISTORY: Social History   Socioeconomic History   Marital status: Married    Spouse name: Not on file   Number of children: Not on file   Years of education: Not on file   Highest education level: Not on file  Occupational History   Not on file  Tobacco Use   Smoking status: Former    Types: Cigarettes    Quit date: 2013    Years since quitting: 11.3    Passive exposure: Current   Smokeless tobacco: Never  Vaping Use   Vaping Use: Never used  Substance and Sexual Activity   Alcohol use: Not Currently    Comment: occasionally   Drug use: No   Sexual activity: Not Currently    Comment: unable to assess   Other Topics Concern   Not on file  Social History Narrative   Not on file   Social Determinants of Health   Financial Resource Strain: Medium Risk (10/10/2021)   Overall Financial Resource Strain (CARDIA)    Difficulty of Paying Living Expenses: Somewhat hard  Food Insecurity: No Food Insecurity (10/10/2021)   Hunger Vital Sign    Worried About Running Out of Food in the Last Year: Never true    Ran Out of Food in the Last Year: Never true  Transportation Needs: Unmet Transportation Needs  (01/21/2022)   PRAPARE - Transportation    Lack of Transportation (Medical): Yes    Lack of Transportation (Non-Medical): Yes  Physical Activity: Inactive (10/10/2021)   Exercise Vital Sign    Days of Exercise per Week: 0 days    Minutes of Exercise per Session: 0 min  Stress: Stress Concern Present (10/10/2021)   [employer] of Occupational Health - Occupational Stress Questionnaire    Feeling of Stress : To some extent  Social Connections: Socially Isolated (10/10/2021)   Social Connection and Isolation Panel [NHANES]    Frequency of Communication with Friends and Family: Once a week    Frequency of Social Gatherings with Friends and Family: Never    Attends Religious Services: Never    Diplomatic Services operational officer: No    Attends Engineer, structural: Never    Marital Status: Married  Catering manager Violence: Not on file    FAMILY HISTORY: Family History  Problem Relation Age of Onset   Lung cancer Mother    Dementia Mother    Parkinson's disease Father    Cancer Father        unk type   Diabetes Sister    Heart attack Sister    Breast cancer Sister    Cancer Maternal Uncle        unk types   Dementia Maternal Grandmother    Cancer Maternal Grandmother        unk type   Cancer Other    Dementia Other     ALLERGIES:  is allergic to tylenol [acetaminophen] and aleve [naproxen].  MEDICATIONS:  Current Outpatient Medications  Medication Sig Dispense Refill   ACCU-CHEK GUIDE test strip USE TO CHECK BLOOD SUGAR UP TO 4 TIMES DAILY AS DIRECTED 100 each 0   Accu-Chek Softclix Lancets lancets USE TO CHECK BLOOD SUAGR UP TO 4 TIMES DAILY AS DIRECTED 100 each 0   anastrozole (ARIMIDEX) 1 MG tablet Take 1 tablet (1 mg total) by mouth daily. 90 tablet 1   blood glucose meter kit and supplies KIT Dispense based on patient and  insurance preference. Use up to four times daily as directed. 1 each 1   calcium carbonate (OS-CAL) 600 MG TABS tablet Take 2  tablets (1,200 mg total) by mouth daily. 60 tablet 5   cyanocobalamin (VITAMIN B12) 1000 MCG tablet Take 1 tablet (1,000 mcg total) by mouth daily. 30 tablet 2   escitalopram (LEXAPRO) 10 MG tablet Take 1 tablet (10 mg total) by mouth daily. 90 tablet 0   hydrocortisone 2.5 % cream Apply topically 2 (two) times daily. 30 g 0   hydrOXYzine (ATARAX) 10 MG tablet Take 1 tablet (10 mg total) by mouth 2 (two) times daily as needed for itching or anxiety. 30 tablet 2   metFORMIN (GLUCOPHAGE) 500 MG tablet Take 1 tablet (500 mg total) by mouth daily. 90 tablet 1   potassium chloride SA (KLOR-CON M) 20 MEQ tablet Take 1 tablet (20 mEq total) by mouth daily. 3 tablet 0   No current facility-administered medications for this visit.     PHYSICAL EXAMINATION: ECOG PERFORMANCE STATUS: 1 - Symptomatic but completely ambulatory Vitals:   08/14/22 1119  BP: 126/80  Pulse: 67  Resp: 18  Temp: 97.6 F (36.4 C)   Filed Weights   08/14/22 1119  Weight: 164 lb 12.8 oz (74.8 kg)    Physical Exam Constitutional:      General: She is not in acute distress. HENT:     Head: Normocephalic and atraumatic.  Eyes:     General: No scleral icterus. Cardiovascular:     Rate and Rhythm: Normal rate.  Pulmonary:     Effort: Pulmonary effort is normal. No respiratory distress.     Breath sounds: No wheezing.  Abdominal:     General: There is no distension.  Musculoskeletal:        General: No deformity. Normal range of motion.     Cervical back: Normal range of motion and neck supple.  Skin:    Coloration: Skin is not jaundiced.  Neurological:     Mental Status: She is alert and oriented to person, place, and time. Mental status is at baseline.     LABORATORY DATA:  I have reviewed the data as listed    Latest Ref Rng & Units 08/14/2022   12:14 PM 07/23/2022    2:32 PM 07/23/2022   10:46 AM  CBC  WBC 4.0 - 10.5 K/uL 3.6  3.0  2.7   Hemoglobin 12.0 - 15.0 g/dL 16.1  09.6  04.5   Hematocrit 36.0  - 46.0 % 38.6  34.9  35.8   Platelets 150 - 400 K/uL 66  59  52       Latest Ref Rng & Units 07/23/2022   10:46 AM 07/21/2022   11:38 AM 06/24/2022   10:25 AM  CMP  Glucose 70 - 99 mg/dL 409  811  914   BUN 8 - 23 mg/dL 15  19  15    Creatinine 0.44 - 1.00 mg/dL 7.82  9.56  2.13   Sodium 135 - 145 mmol/L 138  144  140   Potassium 3.5 - 5.1 mmol/L 3.6  4.1  3.3   Chloride 98 - 111 mmol/L 107  108  107   CO2 22 - 32 mmol/L 24  30  28    Calcium 8.9 - 10.3 mg/dL 8.7  9.8  9.5   Total Protein 6.5 - 8.1 g/dL 6.7   7.7   Total Bilirubin 0.3 - 1.2 mg/dL 0.7   0.6   Alkaline Phos 38 -  126 U/L 60   65   AST 15 - 41 U/L 23   24   ALT 0 - 44 U/L 17   15     RADIOGRAPHIC STUDIES: I have personally reviewed the radiological images as listed and agreed with the findings in the report. NM PET Image Restage (PS) Skull Base to Thigh (F-18 FDG)  Result Date: 08/09/2022 CLINICAL DATA:  Subsequent treatment strategy for metastatic breast cancer. EXAM: NUCLEAR MEDICINE PET SKULL BASE TO THIGH TECHNIQUE: 8.6 mCi F-18 FDG was injected intravenously. Full-ring PET imaging was performed from the skull base to thigh after the radiotracer. CT data was obtained and used for attenuation correction and anatomic localization. Fasting blood glucose: 120 mg/dl COMPARISON:  PET-CT dated 12/09/2021 FINDINGS: Mediastinal blood pool activity: SUV max 2.5 Liver activity: SUV max NA NECK: No hypermetabolic cervical lymphadenopathy. Incidental CT findings: None. CHEST: Status post bilateral mastectomy. Radiation changes in the anterior upper lobes. No hypermetabolic pulmonary nodules. No hypermetabolic thoracic lymphadenopathy. Incidental CT findings: None. ABDOMEN/PELVIS: No abnormal hypermetabolism in the liver, spleen, pancreas, and adrenal glands. No hypermetabolic abdominopelvic lymphadenopathy. Hemostasis clip in the rectum. Prior hypermetabolism is no longer evident. Incidental CT findings: 9 mm nonobstructing left lower pole  renal calculus. Atherosclerotic calcifications of the aortic arch. Status post hysterectomy. SKELETON: No focal hypermetabolic activity to suggest skeletal metastasis. Incidental CT findings: Degenerative changes of the visualized thoracolumbar spine. 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 No findings suspicious for recurrent or metastatic disease. Electronically Signed   By: Charline Bills M.D.   On: 08/09/2022 05:06   CT BONE MARROW BIOPSY & ASPIRATION  Result Date: 07/07/2022 CLINICAL DATA:  Thrombocytopenia, neutropenia and need for bone marrow biopsy. EXAM: CT GUIDED BONE MARROW ASPIRATION AND BIOPSY ANESTHESIA/SEDATION: Moderate (conscious) sedation was employed during this procedure. A total of Versed 1.5 mg and Fentanyl 75 mcg was administered intravenously by radiology nursing. Moderate Sedation Time: 12 minutes. The patient's level of consciousness and vital signs were monitored continuously by radiology nursing throughout the procedure under my direct supervision. PROCEDURE: The procedure risks, benefits, and alternatives were explained to the patient. Questions regarding the procedure were encouraged and answered. The patient understands and consents to the procedure. A time out was performed prior to initiating the procedure. The right gluteal region was prepped with chlorhexidine. Sterile gown and sterile gloves were used for the procedure. Local anesthesia was provided with 1% Lidocaine. Under CT guidance, an 11 gauge On Control bone cutting needle was advanced from a posterior approach into the right iliac bone. Needle positioning was confirmed with CT. Initial non heparinized and heparinized aspirate samples were obtained of bone marrow. Core biopsy was performed via the On Control drill needle. COMPLICATIONS: None FINDINGS: Inspection of initial aspirate did reveal visible particles. Intact core biopsy sample was obtained. IMPRESSION:  CT guided bone marrow biopsy of right posterior iliac bone with both aspirate and core samples obtained. Electronically Signed   By: Irish Lack M.D.   On: 07/07/2022 11:20

## 2022-08-14 NOTE — Assessment & Plan Note (Addendum)
Bilateral breast invasive lobular carcinoma, s/p bilateral mastectomy. [name]   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  pT2 pN1a, ER +90%, PR +90%, HER2 negative (1+)  # Right Breast invasive lobular carcinoma Grade 2, negative margins, right axillary lymph nodes  30/32 involved with metastatic carcinoma, pT1c pN3a ER +90%, PR +51-90%, HER2 negative (1+)  Colonosocpy results were reviewed with patient and her husband. 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  PET scan did not show any detectable metastatic disease.  Unclear if she has stage IV disease to start with or has developed recurrence/metastatic disease.  Recommend MRI brain w wo contrast.     She tolerates Arimidex 1mg  daily well. Continue current regimen.    CDK4/5 inhibitor may be difficult due to her baseline cytopenia.   She is asymptomatic.  Recommend to continue Arimidex 1mg  daily, add Fulvestrant injections.

## 2022-08-14 NOTE — Progress Notes (Signed)
Patient here for follow up. Reports pain to back and pain to right ribcage are

## 2022-08-14 NOTE — Assessment & Plan Note (Addendum)
Etiology unknown.   mild splenomegaly.  Bone marrow biopsy was reviewed and discussed with patient.  Mildly hypercellular bone marrow with otherwise orderly trilineage hematopoiesis.  a definitive morphologic etiology for this pancytopenia is not noted.  Negative cytogenetics, negative myeloid FISH testing. Repeat lab work showed stable thrombocytopenia.  Continue to monitor. Continue B12 supplementation.

## 2022-08-14 NOTE — Assessment & Plan Note (Signed)
See above plan. 

## 2022-08-15 LAB — CANCER ANTIGEN 27.29: CA 27.29: 24 U/mL (ref 0.0–38.6)

## 2022-08-15 LAB — HM DIABETES EYE EXAM

## 2022-08-15 LAB — VITAMIN B12: Vitamin B-12: 361 pg/mL (ref 180–914)

## 2022-08-16 LAB — CANCER ANTIGEN 15-3: CA 15-3: 23.1 U/mL (ref 0.0–25.0)

## 2022-08-19 ENCOUNTER — Inpatient Hospital Stay: Payer: Medicaid Other

## 2022-08-19 DIAGNOSIS — Z170 Estrogen receptor positive status [ER+]: Secondary | ICD-10-CM

## 2022-08-19 DIAGNOSIS — C50511 Malignant neoplasm of lower-outer quadrant of right female breast: Secondary | ICD-10-CM | POA: Diagnosis not present

## 2022-08-19 MED ORDER — FULVESTRANT 250 MG/5ML IM SOSY
500.0000 mg | PREFILLED_SYRINGE | Freq: Once | INTRAMUSCULAR | Status: AC
  Administered 2022-08-19: 500 mg via INTRAMUSCULAR
  Filled 2022-08-19: qty 10

## 2022-08-22 ENCOUNTER — Encounter: Payer: Self-pay | Admitting: Gastroenterology

## 2022-08-26 NOTE — Progress Notes (Signed)
HER 2 report forwarded

## 2022-09-02 ENCOUNTER — Inpatient Hospital Stay: Payer: Medicaid Other | Attending: Radiation Oncology

## 2022-09-02 ENCOUNTER — Other Ambulatory Visit: Payer: Self-pay | Admitting: Oncology

## 2022-09-02 VITALS — BP 136/87 | HR 78 | Temp 98.0°F | Resp 18

## 2022-09-02 DIAGNOSIS — C50511 Malignant neoplasm of lower-outer quadrant of right female breast: Secondary | ICD-10-CM | POA: Insufficient documentation

## 2022-09-02 DIAGNOSIS — Z5111 Encounter for antineoplastic chemotherapy: Secondary | ICD-10-CM | POA: Insufficient documentation

## 2022-09-02 DIAGNOSIS — Z79899 Other long term (current) drug therapy: Secondary | ICD-10-CM | POA: Diagnosis not present

## 2022-09-02 DIAGNOSIS — Z170 Estrogen receptor positive status [ER+]: Secondary | ICD-10-CM

## 2022-09-02 MED ORDER — FULVESTRANT 250 MG/5ML IM SOSY
500.0000 mg | PREFILLED_SYRINGE | Freq: Once | INTRAMUSCULAR | Status: AC
  Administered 2022-09-02: 500 mg via INTRAMUSCULAR
  Filled 2022-09-02: qty 10

## 2022-09-02 MED ORDER — TRIAMCINOLONE ACETONIDE 0.5 % EX OINT: 1.0000 | TOPICAL_OINTMENT | Freq: Two times a day (BID) | CUTANEOUS | 1 refills | Status: AC

## 2022-09-02 NOTE — Progress Notes (Signed)
Patient tolerated Faslodex well today. Complains of worsening red rash. Dr.Yu aware ordered stronger steroid cream, sent to pharmacy. Patient and husband informed, verbalizes understanding.

## 2022-09-09 ENCOUNTER — Ambulatory Visit
Admission: RE | Admit: 2022-09-09 | Discharge: 2022-09-09 | Disposition: A | Discharge status: Home / Self Care | Payer: Medicaid Other | Source: Ambulatory Visit | Attending: Oncology | Admitting: Oncology

## 2022-09-09 DIAGNOSIS — Z170 Estrogen receptor positive status [ER+]: Secondary | ICD-10-CM | POA: Insufficient documentation

## 2022-09-09 DIAGNOSIS — C50412 Malignant neoplasm of upper-outer quadrant of left female breast: Secondary | ICD-10-CM | POA: Insufficient documentation

## 2022-09-09 MED ORDER — GADOBUTROL 1 MMOL/ML IV SOLN
7.5000 mL | Freq: Once | INTRAVENOUS | Status: AC | PRN
  Administered 2022-09-09: 7.5 mL via INTRAVENOUS

## 2022-09-16 ENCOUNTER — Inpatient Hospital Stay: Payer: Medicaid Other

## 2022-09-16 ENCOUNTER — Telehealth: Payer: Self-pay

## 2022-09-16 ENCOUNTER — Encounter: Payer: Self-pay | Admitting: Oncology

## 2022-09-16 ENCOUNTER — Inpatient Hospital Stay (HOSPITAL_BASED_OUTPATIENT_CLINIC_OR_DEPARTMENT_OTHER): Payer: Medicaid Other | Admitting: Oncology

## 2022-09-16 VITALS — BP 145/85 | HR 66 | Temp 96.8°F | Resp 18 | Wt 168.1 lb

## 2022-09-16 DIAGNOSIS — D696 Thrombocytopenia, unspecified: Secondary | ICD-10-CM

## 2022-09-16 DIAGNOSIS — C50412 Malignant neoplasm of upper-outer quadrant of left female breast: Secondary | ICD-10-CM | POA: Diagnosis not present

## 2022-09-16 DIAGNOSIS — C50511 Malignant neoplasm of lower-outer quadrant of right female breast: Secondary | ICD-10-CM | POA: Diagnosis not present

## 2022-09-16 DIAGNOSIS — D72810 Lymphocytopenia: Secondary | ICD-10-CM | POA: Diagnosis not present

## 2022-09-16 DIAGNOSIS — Z170 Estrogen receptor positive status [ER+]: Secondary | ICD-10-CM

## 2022-09-16 DIAGNOSIS — Z5111 Encounter for antineoplastic chemotherapy: Secondary | ICD-10-CM | POA: Diagnosis not present

## 2022-09-16 LAB — CBC WITH DIFFERENTIAL (CANCER CENTER ONLY)
Abs Immature Granulocytes: 0.01 10*3/uL (ref 0.00–0.07)
Basophils Absolute: 0 10*3/uL (ref 0.0–0.1)
Basophils Relative: 1 %
Eosinophils Absolute: 0.1 10*3/uL (ref 0.0–0.5)
Eosinophils Relative: 4 %
HCT: 37 % (ref 36.0–46.0)
Hemoglobin: 12.1 g/dL (ref 12.0–15.0)
Immature Granulocytes: 0 %
Lymphocytes Relative: 20 %
Lymphs Abs: 0.6 10*3/uL — ABNORMAL LOW (ref 0.7–4.0)
MCH: 29.5 pg (ref 26.0–34.0)
MCHC: 32.7 g/dL (ref 30.0–36.0)
MCV: 90.2 fL (ref 80.0–100.0)
Monocytes Absolute: 0.2 10*3/uL (ref 0.1–1.0)
Monocytes Relative: 7 %
Neutro Abs: 1.9 10*3/uL (ref 1.7–7.7)
Neutrophils Relative %: 68 %
Platelet Count: 53 10*3/uL — ABNORMAL LOW (ref 150–400)
RBC: 4.1 MIL/uL (ref 3.87–5.11)
RDW: 14.2 % (ref 11.5–15.5)
WBC Count: 2.8 10*3/uL — ABNORMAL LOW (ref 4.0–10.5)
nRBC: 0 % (ref 0.0–0.2)

## 2022-09-16 LAB — CMP (CANCER CENTER ONLY)
ALT: 16 U/L (ref 0–44)
AST: 22 U/L (ref 15–41)
Albumin: 4 g/dL (ref 3.5–5.0)
Alkaline Phosphatase: 61 U/L (ref 38–126)
Anion gap: 8 (ref 5–15)
BUN: 18 mg/dL (ref 8–23)
CO2: 26 mmol/L (ref 22–32)
Calcium: 9.1 mg/dL (ref 8.9–10.3)
Chloride: 105 mmol/L (ref 98–111)
Creatinine: 0.69 mg/dL (ref 0.44–1.00)
GFR, Estimated: >60 mL/min (ref >60)
Glucose, Bld: 104 mg/dL — ABNORMAL HIGH (ref 70–99)
Potassium: 3.3 mmol/L — ABNORMAL LOW (ref 3.5–5.1)
Sodium: 139 mmol/L (ref 135–145)
Total Bilirubin: 0.7 mg/dL (ref 0.3–1.2)
Total Protein: 7.1 g/dL (ref 6.5–8.1)

## 2022-09-16 MED ORDER — FULVESTRANT 250 MG/5ML IM SOSY
500.0000 mg | PREFILLED_SYRINGE | Freq: Once | INTRAMUSCULAR | Status: AC
  Administered 2022-09-16: 500 mg via INTRAMUSCULAR
  Filled 2022-09-16: qty 10

## 2022-09-16 NOTE — Assessment & Plan Note (Addendum)
Bilateral breast invasive lobular carcinoma, s/p bilateral mastectomy. [name]   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  PET scan did not show any detectable metastatic disease.  Unclear if she has stage IV disease to start with or has developed recurrence/metastatic disease.  Recommend MRI brain w wo contrast- result is pending.    She tolerates Arimidex 1mg  daily well. Continue current regimen.    Not able to start CDK4/5 inhibitor due to her baseline cytopenia.   She is asymptomatic.  Recommend to continue Arimidex 1mg  daily, as well as  Fulvestrant injections.

## 2022-09-16 NOTE — Progress Notes (Signed)
Hematology/Oncology Progress note [phone] [phone]       CHIEF COMPLAINTS/REASON FOR VISIT:  bilateral breast cancer, thrombocytopenia, leukopenia  ASSESSMENT & PLAN:   Cancer Staging  Malignant neoplasm of left breast (HCC) Staging form: Breast, AJCC 8th Edition - Pathologic stage from 11/25/2021: Stage IB (pT2, pN1, cM0, G2, ER+, PR+, HER2-) - Signed by Rickard Patience, MD on 11/25/2021 - Pathologic stage from 07/25/2022: Stage IV (rpTX, pNX, pM1, ER+, PR: Not Assessed, HER2-) - Signed by Rickard Patience, MD on 08/14/2022  Malignant neoplasm of lower-outer quadrant of right breast of female, estrogen receptor positive (HCC) Staging form: Breast, AJCC 8th Edition - Pathologic stage from 11/25/2021: Stage IIIA (pT1c, pN3a, cM0, G2, ER+, PR+, HER2-) - Signed by Rickard Patience, MD on 11/25/2021   Malignant neoplasm of left breast (HCC) Bilateral breast invasive lobular carcinoma, s/p bilateral mastectomy. [name]   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  PET scan did not show any detectable metastatic disease.  Unclear if she has stage IV disease to start with or has developed recurrence/metastatic disease.  Recommend MRI brain w wo contrast- result is pending.    She tolerates Arimidex 1mg  daily well. Continue current regimen.    Not able to start  CDK4/5 inhibitor due to her baseline cytopenia.   She is asymptomatic.  Recommend to continue Arimidex 1mg  daily, as well as  Fulvestrant injections.      Malignant neoplasm of lower-outer quadrant of right breast of female, estrogen receptor positive (HCC) See above plan.  Thrombocytopenia (HCC) Etiology unknown.   mild splenomegaly.  Bone marrow biopsy was reviewed and discussed with patient. Mildly hypercellular bone marrow with otherwise orderly trilineage hematopoiesis.  A definitive morphologic etiology for this pancytopenia is not noted.  Negative cytogenetics, negative myeloid FISH testing. Repeat lab work showed stable thrombocytopenia.  Continue to monitor.  Continue B12 supplementation.     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  Drug induced leukopenia vs other etiologies.  B12 is at the low normal end.  Normal folate. Stable counts.  Continue observation.      Orders Placed This Encounter  Procedures   Cancer antigen 27.29    Standing Status:   Future    Standing Expiration Date:   09/16/2023   Cancer antigen 15-3    Standing Status:   Future    Standing Expiration Date:   09/16/2023   CMP (Cancer Center only)    Standing Status:   Future    Standing Expiration Date:   09/16/2023   CBC with Differential (Cancer Center Only)    Standing Status:   Future    Standing Expiration Date:   09/16/2023   Follow up 4 weeks [name], questions or concerns.  Rickard Patience, MD, PhD [facility]  Hematology Oncology 09/16/2022     HISTORY OF PRESENTING ILLNESS:   is a  62 y.o.  female presents for follow up of bilateral breast cancer, thrombocytopenia and leukopenia.  Oncology History  Malignant neoplasm of left breast (HCC)  08/06/2021 Mammogram   08/06/2021, digital  bilateral mammogram and ultrasound showed left breast 3:00 mass, 1.7 x 1.7 x 1.7 cm, ultrasound of the left axillary is negative. 08/21/2021 right breast 1 x 0.7 x 0.8 cm angulated spiculated mass at the right breast 7:00, 5 cm from nipple.  Ultrasound of the right axillary demonstrates 3 abnormal thickened cortex lymph node. 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 Bary Richard right breast 7:00, invasive mammary carcinoma with lobular features, in situ carcinoma,  ER+, PR+, HER 2-This was found to be concordant by Dr. Bary Richard. right axilla lymph node +, extracapsular extension.  -This was found to be concordant by Dr. Bary Richard   08/28/2021 Initial Diagnosis   Malignant neoplasm of upper-outer quadrant of left breast in female, estrogen receptor positive (HCC)   08/28/2021 Imaging   CT CHEST, ABDOMEN, AND PELVIS WITH CONTRAST Asymmetric mildly enlarged right axillary nodes. Correlate with biopsy. There are some punctate foci of sclerosis in the included spine that could reflect subtle metastatic disease. Nonobstructing bilateral renal calculi.   08/29/2021 Imaging   BILATERAL BREAST MRI WITH AND WITHOUT CONTRAST 1. 2.5 centimeter enhancing mass in the LOWER OUTER QUADRANT of the RIGHT breast, correlating with known malignancy. 2. At least 4 enlarged RIGHT axillary lymph nodes, correlating well with recently biopsied lymph node showing metastatic disease. 3. 3.4 centimeter enhancing mass in the LEFT breast, with an additional 2.9 centimeters of non mass enhancement posterior to the mass also suspicious for malignancy. This likely correlates with the area calcifications seen mammographically. 4. 5 millimeters satellite nodule along the LATERAL aspect of known malignancy in the LOWER OUTER QUADRANT of the LEFT breast. 5.  LEFT axilla is negative for adenopathy.       09/05/2021 Imaging   NUCLEAR MEDICINE WHOLE BODY BONE SCAN Uptake at L2 which is nonspecific but may be related to advanced degenerative disc and facet disease changes at both L1-L2 and L2-L3; no evidence of osseous metastatic disease by CT. No definite osseous metastatic lesions identified.    Genetic Testing   Negative genetic testing. No pathogenic variants identified on the [facility] CancerNext-Expanded+RNA panel. The report date is 10/03/2021.  The CancerNext-Expanded + RNAinsight gene panel offered by [employer] and includes sequencing and rearrangement analysis for the following 77 genes: IP, ALK, APC*, ATM*, AXIN2, BAP1, BARD1, BLM, BMPR1A, BRCA1*, BRCA2*, BRIP1*, CDC73, CDH1*,CDK4, CDKN1B, CDKN2A, CHEK2*, CTNNA1, DICER1, FANCC, FH, FLCN, GALNT12, KIF1B, LZTR1, MAX, MEN1, MET, MLH1*, MSH2*, MSH3, MSH6*, MUTYH*, NBN, NF1*, NF2, NTHL1, PALB2*, PHOX2B, PMS2*, POT1, PRKAR1A, PTCH1, PTEN*, RAD51C*, RAD51D*,RB1, RECQL, RET, SDHA, SDHAF2, SDHB, SDHC, SDHD, SMAD4, SMARCA4, SMARCB1, SMARCE1, STK11, SUFU, TMEM127, TP53*,TSC1, TSC2, VHL and XRCC2 (sequencing and deletion/duplication); EGFR, EGLN1, HOXB13, KIT, MITF, PDGFRA, POLD1 and POLE (sequencing only); EPCAM and GREM1 (deletion/duplication only).   11/15/2021 Surgery   She underwent left simple mastectomy and right modified mastectomy.   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  pT2 pN1a,  ER +90%, PR +90%, HER2 negative (1+)  # Right Breast invasive lobular carcinoma Grade 2, negative margins, right axillary lymph nodes  30/32 involved with metastatic carcinoma, pT1c pN3a ER +90%, PR +51-90%, HER2 negative (1+)    11/25/2021 Cancer Staging   Staging form: Breast, AJCC 8th Edition -  Pathologic stage from 11/25/2021: Stage IB (pT2, pN1, cM0, G2, ER+, PR+, HER2-) - Signed by Rickard Patience, MD on 11/25/2021 Stage prefix: Initial diagnosis Multigene prognostic tests performed: None Histologic grading system: 3 grade system   01/09/2022 - 03/10/2022 Radiation Therapy   Adjuvant breast Radiation.    07/25/2022 Cancer Staging   Staging form: Breast, AJCC 8th Edition - Pathologic stage from 07/25/2022: Stage IV (rpTX, pNX, pM1, ER+, PR: Not Assessed, HER2-) - Signed by Rickard Patience, MD on 08/14/2022 Stage prefix: Recurrence Multigene prognostic tests performed: None   07/25/2022 Procedure   Colonoscopy showed Findings - Six 5 to 6 mm polyps in the ascending colon, removed with a cold snare. Resected and retrieved. - One 10 mm polyp in the descending colon, removed with a cold snare. Resected and retrieved. Clip was placed. - One 30 mm polyp in the rectum, removed with a hot snare. Resected and retrieved. Clips were placed.  Pathology showed A.  COLON POLYP X 6, ASCENDING AND CECUM; COLD SNARE: - COLONIC MUCOSA WITH METASTATIC LOBULAR BREAST CARCINOMA (3) - HYPERPLASTIC POLYP (3)  B.  COLON POLYP, DESCENDING; COLD SNARE: - TUBULAR ADENOMA. - NEGATIVE FOR HIGH-GRADE DYSPLASIA AND MALIGNANCY.  C.  RECTUM POLYP; HOT SNARE: - TUBULOVILLOUS ADENOMA. - NEGATIVE FOR HIGH-GRADE DYSPLASIA AND MALIGNANCY.  Comment: The metastatic lobular breast carcinoma is positive for GATA-3 and ER (strong staining in greater than 90% of the tumor cells). HER2 negative (1+)     Malignant neoplasm of lower-outer quadrant of right breast of female, estrogen receptor positive (HCC)  08/06/2021 Mammogram   08/06/2021, digital bilateral mammogram and ultrasound showed left breast 3:00 mass, 1.7 x 1.7 x 1.7 cm, ultrasound of the left axillary is negative. 08/21/2021 right breast 1 x 0.7 x 0.8 cm angulated spiculated mass at the right breast 7:00, 5 cm from nipple.  Ultrasound of the right axillary demonstrates 3  abnormal thickened cortex lymph node. 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 Bary Richard right breast 7:00, invasive mammary carcinoma with lobular features, in situ carcinoma,  ER+, PR+, HER 2-This was found to be concordant by Dr. Bary Richard. right axilla lymph node +, extracapsular extension.  -This was found to be concordant by Dr. Bary Richard   08/28/2021 Initial Diagnosis   Malignant neoplasm of lower-outer quadrant of right breast of female, estrogen receptor positive (HCC)   08/28/2021 Imaging   CT CHEST, ABDOMEN, AND PELVIS WITH CONTRAST Asymmetric mildly enlarged right axillary nodes. Correlate with biopsy. There are some punctate foci of sclerosis in the included spine that could reflect subtle metastatic disease. Nonobstructing bilateral renal calculi.   08/29/2021 Imaging   BILATERAL BREAST MRI WITH AND WITHOUT CONTRAST 1. 2.5 centimeter enhancing mass in the LOWER OUTER QUADRANT of the RIGHT breast, correlating with known malignancy. 2. At least 4 enlarged RIGHT axillary lymph nodes, correlating well with recently biopsied lymph node showing metastatic disease. 3. 3.4 centimeter enhancing mass in the LEFT breast, with an additional 2.9 centimeters of non mass enhancement posterior to the mass  also suspicious for malignancy. This likely correlates with the area calcifications seen mammographically. 4. 5 millimeters satellite nodule along the LATERAL aspect of known malignancy in the LOWER OUTER QUADRANT of the LEFT breast. 5. LEFT axilla is negative for adenopathy.       09/05/2021 Imaging   NUCLEAR MEDICINE WHOLE BODY BONE SCAN Uptake at L2 which is nonspecific but may be related to advanced degenerative disc and facet disease changes at both L1-L2 and L2-L3; no evidence of osseous metastatic disease by  CT. No definite osseous metastatic lesions identified.   09/09/2021 Oncotype testing   [id number] block RIGHT 7:00 5 CM FN; ULTRASOUND-GUIDED BIOPSY Oncotype Dx RS score 22    Genetic Testing   Negative genetic testing. No pathogenic variants identified on the [facility] CancerNext-Expanded+RNA panel. The report date is 10/03/2021.  The CancerNext-Expanded + RNAinsight gene panel offered by [employer] and includes sequencing and rearrangement analysis for the following 77 genes: IP, ALK, APC*, ATM*, AXIN2, BAP1, BARD1, BLM, BMPR1A, BRCA1*, BRCA2*, BRIP1*, CDC73, CDH1*,CDK4, CDKN1B, CDKN2A, CHEK2*, CTNNA1, DICER1, FANCC, FH, FLCN, GALNT12, KIF1B, LZTR1, MAX, MEN1, MET, MLH1*, MSH2*, MSH3, MSH6*, MUTYH*, NBN, NF1*, NF2, NTHL1, PALB2*, PHOX2B, PMS2*, POT1, PRKAR1A, PTCH1, PTEN*, RAD51C*, RAD51D*,RB1, RECQL, RET, SDHA, SDHAF2, SDHB, SDHC, SDHD, SMAD4, SMARCA4, SMARCB1, SMARCE1, STK11, SUFU, TMEM127, TP53*,TSC1, TSC2, VHL and XRCC2 (sequencing and deletion/duplication); EGFR, EGLN1, HOXB13, KIT, MITF, PDGFRA, POLD1 and POLE (sequencing only); EPCAM and GREM1 (deletion/duplication only).   11/15/2021 Surgery   She underwent left simple mastectomy and right modified mastectomy.   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  pT2 pN1a, ER +90%, PR +90%, HER2 negative (1+)  # Right Breast invasive lobular carcinoma Grade 2, negative margins, right axillary lymph nodes  30/32 involved with metastatic carcinoma, pT1c pN3a ER +90%, PR +51-90%, HER2 negative (1+)    11/25/2021 Cancer Staging   Staging form: Breast, AJCC 8th Edition - Pathologic stage from 11/25/2021: Stage IIIA (pT1c, pN3a, cM0, G2, ER+, PR+, HER2-) - Signed by Rickard Patience, MD on 11/25/2021 Stage prefix: Initial  diagnosis Multigene prognostic tests performed: None Histologic grading system: 3 grade system   12/10/2021 Imaging   PET scan  1. Interval bilateral mastectomy and right axillary node dissection.No evidence of residual disease in the chest wall, nodal metastases or distant metastases. 2. Focal hypermetabolic activity in the proximal rectum corresponding with an intraluminal polypoid lesion, suspicious for a villous adenoma or early colon cancer. Sigmoidoscopy/colonoscopy recommended unless recently performed   12/10/2021 Imaging   1. Interval bilateral mastectomy and right axillary node dissection.No evidence of residual disease in the chest wall, nodal metastasesor distant metastases. 2. Focal hypermetabolic activity in the proximal rectum corresponding with an intraluminal polypoid lesion, suspicious for a villous adenoma or early colon cancer. Sigmoidoscopy/colonoscopy recommended unless recently performed    She is a poor historian. History of major depression, psychosis, previously on olanzapine and Remeron not currently on any and if not currently following up with psychiatrist. Patient's family history is positive for sister with breast cancer   INTERVAL HISTORY Sharon Woods is a 62 y.o. female who has above history reviewed by me today presents for follow up visit for management of bilateral breast cancer She has been on Lexapro since Oct 2023.  Take Arimidex 1mg  daily since Dec 2023, she reports some muscle/joint pain but overall manageable.  She tolerates  Fulvestrant    Review of Systems  Constitutional:  Negative for appetite change, chills, fatigue and fever.  HENT:   Negative for hearing loss and voice change.   Eyes:  Negative for eye problems.  Respiratory:  Negative for chest tightness and cough.   Cardiovascular:  Negative for chest pain.  Gastrointestinal:  Negative for abdominal distention, abdominal pain and blood in stool.  Endocrine: Negative for hot  flashes.  Genitourinary:  Negative for difficulty urinating and frequency.   Musculoskeletal:  Negative for arthralgias.  Skin:  Negative for itching and rash.  Neurological:  Negative for extremity weakness.  Hematological:  Negative for adenopathy.  Psychiatric/Behavioral:  Negative for confusion.     MEDICAL HISTORY:  Past Medical History:  Diagnosis Date   Anxiety    Cancer (HCC)    Depression    Diabetes mellitus without complication (HCC)    History of high cholesterol 2000   Hyperlipidemia    Hypertension    Patient denies medical problems    Thrombocytopenia (HCC)     SURGICAL HISTORY: Past Surgical History:  Procedure Laterality Date   ABDOMINAL HYSTERECTOMY     AXILLARY SENTINEL NODE BIOPSY Left 11/15/2021   Procedure: AXILLARY SENTINEL NODE BIOPSY;  Surgeon: Campbell Lerner, MD;  Location: [facility] ORS;  Service: General;  Laterality: Left;   BREAST BIOPSY Right 08/21/2021   [place] bx 7:00 mass coil clip path pending   BREAST BIOPSY Right 08/21/2021   [place] bx of LN, hydro marker, path pending   BREAST BIOPSY Left 08/21/2021   [place] bx, heart marker, path pending   CESAREAN SECTION     x2   COLONOSCOPY WITH PROPOFOL N/A 07/25/2022   Procedure: COLONOSCOPY WITH PROPOFOL;  Surgeon: Wyline Mood, MD;  Location: [facility];  Service: Gastroenterology;  Laterality: N/A;   MASTECTOMY     MASTECTOMY MODIFIED RADICAL Right 11/15/2021   Procedure: MASTECTOMY MODIFIED RADICAL;  Surgeon: Campbell Lerner, MD;  Location: [facility] OR[facility];  Service: General;  Laterality: Right;   NO PAST SURGERIES     TOTAL MASTECTOMY Left 11/15/2021   Procedure: TOTAL MASTECTOMY;  Surgeon: Campbell Lerner, MD;  Location: [facility] OR[facility];  Service: General;  Laterality: Left;    SOCIAL HISTORY: Social History   Socioeconomic History   Marital status: Married    Spouse name: Not on file   Number of children: Not on file   Years of education: Not on file   Highest education level: Not on file  Occupational  History   Not on file  Tobacco Use   Smoking status: Former    Types: Cigarettes    Quit date: 2013    Years since quitting: 11.4    Passive exposure: Current   Smokeless tobacco: Never  Vaping Use   Vaping Use: Never used  Substance and Sexual Activity   Alcohol use: Not Currently    Comment: occasionally   Drug use: No   Sexual activity: Not Currently    Comment: unable to assess   Other Topics Concern   Not on file  Social History Narrative   Not on file   Social Determinants of Health   Financial Resource Strain: Medium Risk (10/10/2021)   Overall Financial Resource Strain (CARDIA)    Difficulty of Paying Living Expenses: Somewhat hard  Food Insecurity: No Food Insecurity (10/10/2021)   Hunger Vital Sign    Worried About Running Out of Food in the Last Year: Never true    Ran Out of Food in the Last Year:  Never true  Transportation Needs: Unmet Transportation Needs (01/21/2022)   PRAPARE - Transportation    Lack of Transportation (Medical): Yes    Lack of Transportation (Non-Medical): Yes  Physical Activity: Inactive (10/10/2021)   Exercise Vital Sign    Days of Exercise per Week: 0 days    Minutes of Exercise per Session: 0 min  Stress: Stress Concern Present (10/10/2021)   [employer] of Occupational Health - Occupational Stress Questionnaire    Feeling of Stress : To some extent  Social Connections: Socially Isolated (10/10/2021)   Social Connection and Isolation Panel [NHANES]    Frequency of Communication with Friends and Family: Once a week    Frequency of Social Gatherings with Friends and Family: Never    Attends Religious Services: Never    Diplomatic Services operational officer: No    Attends Engineer, structural: Never    Marital Status: Married  Catering manager Violence: Not on file    FAMILY HISTORY: Family History  Problem Relation Age of Onset   Lung cancer Mother    Dementia Mother    Parkinson's disease Father    Cancer  Father        unk type   Diabetes Sister    Heart attack Sister    Breast cancer Sister    Cancer Maternal Uncle        unk types   Dementia Maternal Grandmother    Cancer Maternal Grandmother        unk type   Cancer Other    Dementia Other     ALLERGIES:  is allergic to tylenol [acetaminophen] and aleve [naproxen].  MEDICATIONS:  Current Outpatient Medications  Medication Sig Dispense Refill   ACCU-CHEK GUIDE test strip USE TO CHECK BLOOD SUGAR UP TO 4 TIMES DAILY AS DIRECTED 100 each 0   Accu-Chek Softclix Lancets lancets USE TO CHECK BLOOD SUAGR UP TO 4 TIMES DAILY AS DIRECTED 100 each 0   anastrozole (ARIMIDEX) 1 MG tablet Take 1 tablet (1 mg total) by mouth daily. 90 tablet 1   blood glucose meter kit and supplies KIT Dispense based on patient and insurance preference. Use up to four times daily as directed. 1 each 1   calcium carbonate (OS-CAL) 600 MG TABS tablet Take 2 tablets (1,200 mg total) by mouth daily. 60 tablet 5   cyanocobalamin (VITAMIN B12) 1000 MCG tablet Take 1 tablet (1,000 mcg total) by mouth daily. 30 tablet 2   escitalopram (LEXAPRO) 10 MG tablet Take 1 tablet (10 mg total) by mouth daily. 90 tablet 0   hydrOXYzine (ATARAX) 10 MG tablet Take 1 tablet (10 mg total) by mouth 2 (two) times daily as needed for itching or anxiety. 30 tablet 2   metFORMIN (GLUCOPHAGE) 500 MG tablet Take 1 tablet (500 mg total) by mouth daily. 90 tablet 1   potassium chloride SA (KLOR-CON M) 20 MEQ tablet Take 1 tablet (20 mEq total) by mouth daily. 3 tablet 0   triamcinolone ointment (KENALOG) 0.5 % Apply 1 Application topically 2 (two) times daily. 30 g 1   No current facility-administered medications for this visit.     PHYSICAL EXAMINATION: ECOG PERFORMANCE STATUS: 1 - Symptomatic but completely ambulatory Vitals:   09/16/22 1339  BP: (!) 145/85  Pulse: 66  Resp: 18  Temp: (!) 96.8 F (36 C)  SpO2: 98%   Filed Weights   09/16/22 1339  Weight: 168 lb 1.6 oz (76.2  kg)    Physical Exam  Constitutional:      General: She is not in acute distress. HENT:     Head: Normocephalic and atraumatic.  Eyes:     General: No scleral icterus. Cardiovascular:     Rate and Rhythm: Normal rate.  Pulmonary:     Effort: Pulmonary effort is normal. No respiratory distress.     Breath sounds: No wheezing.  Abdominal:     General: There is no distension.  Musculoskeletal:        General: No deformity. Normal range of motion.     Cervical back: Normal range of motion and neck supple.  Skin:    Coloration: Skin is not jaundiced.  Neurological:     Mental Status: She is alert and oriented to person, place, and time. Mental status is at baseline.     LABORATORY DATA:  I have reviewed the data as listed    Latest Ref Rng & Units 09/16/2022    1:19 PM 08/14/2022   12:14 PM 07/23/2022    2:32 PM  CBC  WBC 4.0 - 10.5 K/uL 2.8  3.6  3.0   Hemoglobin 12.0 - 15.0 g/dL 16.1  09.6  04.5   Hematocrit 36.0 - 46.0 % 37.0  38.6  34.9   Platelets 150 - 400 K/uL 53  66  59       Latest Ref Rng & Units 09/16/2022    1:19 PM 07/23/2022   10:46 AM 07/21/2022   11:38 AM  CMP  Glucose 70 - 99 mg/dL 409  811  914   BUN 8 - 23 mg/dL 18  15  19    Creatinine 0.44 - 1.00 mg/dL 7.82  9.56  2.13   Sodium 135 - 145 mmol/L 139  138  144   Potassium 3.5 - 5.1 mmol/L 3.3  3.6  4.1   Chloride 98 - 111 mmol/L 105  107  108   CO2 22 - 32 mmol/L 26  24  30    Calcium 8.9 - 10.3 mg/dL 9.1  8.7  9.8   Total Protein 6.5 - 8.1 g/dL 7.1  6.7    Total Bilirubin 0.3 - 1.2 mg/dL 0.7  0.7    Alkaline Phos 38 - 126 U/L 61  60    AST 15 - 41 U/L 22  23    ALT 0 - 44 U/L 16  17      RADIOGRAPHIC STUDIES: I have personally reviewed the radiological images as listed and agreed with the findings in the report. NM PET Image Restage (PS) Skull Base to Thigh (F-18 FDG)  Result Date: 08/09/2022 CLINICAL DATA:  Subsequent treatment strategy for metastatic breast cancer. EXAM: NUCLEAR MEDICINE PET  SKULL BASE TO THIGH TECHNIQUE: 8.6 mCi F-18 FDG was injected intravenously. Full-ring PET imaging was performed from the skull base to thigh after the radiotracer. CT data was obtained and used for attenuation correction and anatomic localization. Fasting blood glucose: 120 mg/dl COMPARISON:  PET-CT dated 12/09/2021 FINDINGS: Mediastinal blood pool activity: SUV max 2.5 Liver activity: SUV max NA NECK: No hypermetabolic cervical lymphadenopathy. Incidental CT findings: None. CHEST: Status post bilateral mastectomy. Radiation changes in the anterior upper lobes. No hypermetabolic pulmonary nodules. No hypermetabolic thoracic lymphadenopathy. Incidental CT findings: None. ABDOMEN/PELVIS: No abnormal hypermetabolism in the liver, spleen, pancreas, and adrenal glands. No hypermetabolic abdominopelvic lymphadenopathy. Hemostasis clip in the rectum. Prior hypermetabolism is no longer evident. Incidental CT findings: 9 mm nonobstructing left lower pole renal calculus. Atherosclerotic calcifications of the aortic arch. Status post  hysterectomy. SKELETON: No focal hypermetabolic activity to suggest skeletal metastasis. Incidental CT findings: Degenerative changes of the visualized thoracolumbar spine. 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 No findings suspicious for recurrent or metastatic disease. Electronically Signed   By: Charline Bills M.D.   On: 08/09/2022 05:06   CT BONE MARROW BIOPSY & ASPIRATION  Result Date: 07/07/2022 CLINICAL DATA:  Thrombocytopenia, neutropenia and need for bone marrow biopsy. EXAM: CT GUIDED BONE MARROW ASPIRATION AND BIOPSY ANESTHESIA/SEDATION: Moderate (conscious) sedation was employed during this procedure. A total of Versed 1.5 mg and Fentanyl 75 mcg was administered intravenously by radiology nursing. Moderate Sedation Time: 12 minutes. The patient's level of consciousness and vital signs were monitored continuously by  radiology nursing throughout the procedure under my direct supervision. PROCEDURE: The procedure risks, benefits, and alternatives were explained to the patient. Questions regarding the procedure were encouraged and answered. The patient understands and consents to the procedure. A time out was performed prior to initiating the procedure. The right gluteal region was prepped with chlorhexidine. Sterile gown and sterile gloves were used for the procedure. Local anesthesia was provided with 1% Lidocaine. Under CT guidance, an 11 gauge On Control bone cutting needle was advanced from a posterior approach into the right iliac bone. Needle positioning was confirmed with CT. Initial non heparinized and heparinized aspirate samples were obtained of bone marrow. Core biopsy was performed via the On Control drill needle. COMPLICATIONS: None FINDINGS: Inspection of initial aspirate did reveal visible particles. Intact core biopsy sample was obtained. IMPRESSION: CT guided bone marrow biopsy of right posterior iliac bone with both aspirate and core samples obtained. Electronically Signed   By: Irish Lack M.D.   On: 07/07/2022 11:20

## 2022-09-16 NOTE — Assessment & Plan Note (Signed)
See above plan. 

## 2022-09-16 NOTE — Assessment & Plan Note (Signed)
Etiology unknown.   mild splenomegaly.  Bone marrow biopsy was reviewed and discussed with patient. Mildly hypercellular bone marrow with otherwise orderly trilineage hematopoiesis.  A definitive morphologic etiology for this pancytopenia is not noted.  Negative cytogenetics, negative myeloid FISH testing. Repeat lab work showed stable thrombocytopenia.  Continue to monitor.  Continue B12 supplementation.

## 2022-09-16 NOTE — Assessment & Plan Note (Signed)
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  Drug induced leukopenia vs other etiologies.  B12 is at the low normal end.  Normal folate. Stable counts.  Continue observation.  

## 2022-09-16 NOTE — Telephone Encounter (Signed)
[facility]  (xT & xR) on [id number], biopsy, collected 07/25/22.   Financial application was submitted.

## 2022-10-01 ENCOUNTER — Encounter: Payer: Self-pay | Admitting: Oncology

## 2022-10-01 NOTE — Telephone Encounter (Signed)
Results sent to scan. Copy provided to MD.

## 2022-10-03 ENCOUNTER — Encounter: Payer: Self-pay | Admitting: Oncology

## 2022-10-06 ENCOUNTER — Encounter: Payer: Self-pay | Admitting: Oncology

## 2022-10-09 ENCOUNTER — Encounter: Payer: Self-pay | Admitting: Oncology

## 2022-10-13 ENCOUNTER — Encounter: Payer: Self-pay | Admitting: Oncology

## 2022-10-13 ENCOUNTER — Other Ambulatory Visit: Payer: Medicaid Other

## 2022-10-14 ENCOUNTER — Other Ambulatory Visit: Payer: Self-pay | Admitting: Nurse Practitioner

## 2022-10-14 ENCOUNTER — Inpatient Hospital Stay: Payer: MEDICAID

## 2022-10-14 ENCOUNTER — Encounter: Payer: Self-pay | Admitting: Oncology

## 2022-10-14 ENCOUNTER — Inpatient Hospital Stay (HOSPITAL_BASED_OUTPATIENT_CLINIC_OR_DEPARTMENT_OTHER): Payer: MEDICAID | Admitting: Oncology

## 2022-10-14 ENCOUNTER — Inpatient Hospital Stay: Payer: MEDICAID | Attending: Radiation Oncology

## 2022-10-14 VITALS — BP 128/84 | HR 80 | Temp 97.5°F | Resp 18 | Wt 169.2 lb

## 2022-10-14 DIAGNOSIS — D696 Thrombocytopenia, unspecified: Secondary | ICD-10-CM

## 2022-10-14 DIAGNOSIS — Z79899 Other long term (current) drug therapy: Secondary | ICD-10-CM | POA: Diagnosis not present

## 2022-10-14 DIAGNOSIS — D72810 Lymphocytopenia: Secondary | ICD-10-CM

## 2022-10-14 DIAGNOSIS — C50511 Malignant neoplasm of lower-outer quadrant of right female breast: Secondary | ICD-10-CM

## 2022-10-14 DIAGNOSIS — Z170 Estrogen receptor positive status [ER+]: Secondary | ICD-10-CM

## 2022-10-14 DIAGNOSIS — Z5111 Encounter for antineoplastic chemotherapy: Secondary | ICD-10-CM | POA: Insufficient documentation

## 2022-10-14 DIAGNOSIS — C50812 Malignant neoplasm of overlapping sites of left female breast: Secondary | ICD-10-CM | POA: Insufficient documentation

## 2022-10-14 DIAGNOSIS — C50412 Malignant neoplasm of upper-outer quadrant of left female breast: Secondary | ICD-10-CM

## 2022-10-14 LAB — CBC WITH DIFFERENTIAL (CANCER CENTER ONLY)
Abs Immature Granulocytes: 0.01 10*3/uL (ref 0.00–0.07)
Basophils Absolute: 0 10*3/uL (ref 0.0–0.1)
Basophils Relative: 1 %
Eosinophils Absolute: 0.1 10*3/uL (ref 0.0–0.5)
Eosinophils Relative: 3 %
HCT: 37.8 % (ref 36.0–46.0)
Hemoglobin: 12.7 g/dL (ref 12.0–15.0)
Immature Granulocytes: 0 %
Lymphocytes Relative: 19 %
Lymphs Abs: 0.8 10*3/uL (ref 0.7–4.0)
MCH: 29.5 pg (ref 26.0–34.0)
MCHC: 33.6 g/dL (ref 30.0–36.0)
MCV: 87.9 fL (ref 80.0–100.0)
Monocytes Absolute: 0.3 10*3/uL (ref 0.1–1.0)
Monocytes Relative: 6 %
Neutro Abs: 3.2 10*3/uL (ref 1.7–7.7)
Neutrophils Relative %: 71 %
Platelet Count: 67 10*3/uL — ABNORMAL LOW (ref 150–400)
RBC: 4.3 MIL/uL (ref 3.87–5.11)
RDW: 14.5 % (ref 11.5–15.5)
WBC Count: 4.4 10*3/uL (ref 4.0–10.5)
nRBC: 0 % (ref 0.0–0.2)

## 2022-10-14 LAB — CMP (CANCER CENTER ONLY)
ALT: 16 U/L (ref 0–44)
AST: 26 U/L (ref 15–41)
Albumin: 4.1 g/dL (ref 3.5–5.0)
Alkaline Phosphatase: 63 U/L (ref 38–126)
Anion gap: 10 (ref 5–15)
BUN: 15 mg/dL (ref 8–23)
CO2: 23 mmol/L (ref 22–32)
Calcium: 9.2 mg/dL (ref 8.9–10.3)
Chloride: 104 mmol/L (ref 98–111)
Creatinine: 0.86 mg/dL (ref 0.44–1.00)
GFR, Estimated: >60 mL/min (ref >60)
Glucose, Bld: 203 mg/dL — ABNORMAL HIGH (ref 70–99)
Potassium: 3.7 mmol/L (ref 3.5–5.1)
Sodium: 137 mmol/L (ref 135–145)
Total Bilirubin: 0.8 mg/dL (ref 0.3–1.2)
Total Protein: 7.5 g/dL (ref 6.5–8.1)

## 2022-10-14 MED ORDER — FULVESTRANT 250 MG/5ML IM SOSY
500.0000 mg | PREFILLED_SYRINGE | Freq: Once | INTRAMUSCULAR | Status: AC
  Administered 2022-10-14: 500 mg via INTRAMUSCULAR
  Filled 2022-10-14: qty 10

## 2022-10-14 MED ORDER — VITAMIN B-12 1000 MCG PO TABS: 1000.0000 ug | ORAL_TABLET | Freq: Every day | ORAL | 2 refills | Status: DC

## 2022-10-14 NOTE — Progress Notes (Signed)
Hematology/Oncology Progress note Telephone:([phone] Fax:([phone]       CHIEF COMPLAINTS/REASON FOR VISIT:  bilateral breast cancer, thrombocytopenia, leukopenia  ASSESSMENT & PLAN:   Cancer Staging  Malignant neoplasm of left breast (HCC) Staging form: Breast, AJCC 8th Edition - Pathologic stage from 11/25/2021: Stage IB (pT2, pN1, cM0, G2, ER+, PR+, HER2-) - Signed by Sharon Patience, MD on 11/25/2021 - Pathologic stage from 07/25/2022: Stage IV (rpTX, pNX, pM1, ER+, PR: Not Assessed, HER2-) - Signed by Sharon Patience, MD on 08/14/2022  Malignant neoplasm of lower-outer quadrant of right breast of female, estrogen receptor positive (HCC) Staging form: Breast, AJCC 8th Edition - Pathologic stage from 11/25/2021: Stage IIIA (pT1c, pN3a, cM0, G2, ER+, PR+, HER2-) - Signed by Sharon Patience, MD on 11/25/2021   Malignant neoplasm of left breast (HCC) Bilateral breast invasive lobular carcinoma, s/p bilateral mastectomy. [name]   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  PET scan did not show any detectable metastatic disease.  Unclear if she has stage IV disease to start with or has developed recurrence/metastatic disease.  Recommend MRI brain w wo contrast-negative for brain metastasis-small vessel ischemia, recommend patient to discuss with PCP.  Breast cancer  markers are stable. Not able to start CDK4/5 inhibitor due to her baseline cytopenia.   She is asymptomatic.  Recommend to continue Arimidex 1mg  daily, as well as  Fulvestrant injections.    Malignant neoplasm of lower-outer quadrant of right breast of female, estrogen receptor positive (HCC) See above plan.  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  Drug induced leukopenia vs other etiologies.  B12 is at the low normal end.  Normal folate. Improved/stable counts.  Continue observation.   Thrombocytopenia (HCC) Etiology unknown.   mild splenomegaly.  Bone marrow biopsy was reviewed and discussed with patient. Mildly hypercellular bone marrow with otherwise orderly trilineage hematopoiesis.  A definitive morphologic etiology for this pancytopenia is not noted.  Negative cytogenetics, negative myeloid FISH testing. Repeat lab work showed stable thrombocytopenia.  Continue to monitor.  Continue B12 supplementation.        Orders Placed This Encounter  Procedures   CBC with Differential (Cancer Center Only)    Standing Status:   Future    Standing Expiration Date:   10/14/2023   CMP (Cancer Center only)    Standing Status:   Future    Standing Expiration Date:   10/14/2023   Cancer antigen 15-3    Standing Status:   Future    Standing Expiration Date:   10/14/2023   Cancer antigen 27.29    Standing Status:   Future    Standing Expiration Date:   10/14/2023   Follow up  Monthly Faslodex x 2 Follow-up in 3 months [name], questions or concerns.  Sharon Patience,  MD, PhD [facility] Hematology Oncology 10/14/2022     HISTORY OF PRESENTING ILLNESS:   is a  62 y.o.  female presents for follow up of bilateral breast cancer, thrombocytopenia and leukopenia.  Oncology History   Malignant neoplasm of left breast (HCC)  08/06/2021 Mammogram   08/06/2021, digital bilateral mammogram and ultrasound showed left breast 3:00 mass, 1.7 x 1.7 x 1.7 cm, ultrasound of the left axillary is negative. 08/21/2021 right breast 1 x 0.7 x 0.8 cm angulated spiculated mass at the right breast 7:00, 5 cm from nipple.  Ultrasound of the right axillary demonstrates 3 abnormal thickened cortex lymph node. 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 Bary Richard right breast 7:00, invasive mammary carcinoma with lobular features, in situ carcinoma,  ER+, PR+, HER 2-This was found to be concordant by Dr. Bary Richard. right axilla lymph node +, extracapsular extension.  -This was found to be concordant by Dr. Bary Richard   08/28/2021 Initial Diagnosis   Malignant neoplasm of upper-outer quadrant of left breast in female, estrogen receptor positive (HCC)   08/28/2021 Imaging   CT CHEST, ABDOMEN, AND PELVIS WITH CONTRAST Asymmetric mildly enlarged right axillary nodes. Correlate with biopsy. There are some punctate foci of sclerosis in the included spine that could reflect subtle metastatic disease. Nonobstructing bilateral renal calculi.   08/29/2021 Imaging   BILATERAL BREAST MRI WITH AND WITHOUT CONTRAST 1. 2.5 centimeter enhancing mass in the LOWER OUTER QUADRANT of the RIGHT breast, correlating with known malignancy. 2. At least 4 enlarged RIGHT axillary lymph nodes, correlating well with recently biopsied lymph node showing metastatic disease. 3. 3.4 centimeter enhancing mass in the LEFT breast, with an additional 2.9 centimeters of non mass enhancement posterior to the mass also suspicious for malignancy. This likely correlates with the area calcifications seen mammographically. 4. 5 millimeters satellite nodule along the  LATERAL aspect of known malignancy in the LOWER OUTER QUADRANT of the LEFT breast. 5. LEFT axilla is negative for adenopathy.       09/05/2021 Imaging   NUCLEAR MEDICINE WHOLE BODY BONE SCAN Uptake at L2 which is nonspecific but may be related to advanced degenerative disc and facet disease changes at both L1-L2 and L2-L3; no evidence of osseous metastatic disease by CT. No definite osseous metastatic lesions identified.    Genetic Testing   Negative genetic testing. No pathogenic variants identified on the [facility] CancerNext-Expanded+RNA panel. The report date is 10/03/2021.  The CancerNext-Expanded + RNAinsight gene panel offered by [employer] and includes sequencing and rearrangement analysis for the following 77 genes: IP, ALK, APC*, ATM*, AXIN2, BAP1, BARD1, BLM, BMPR1A, BRCA1*, BRCA2*, BRIP1*, CDC73, CDH1*,CDK4, CDKN1B, CDKN2A, CHEK2*, CTNNA1, DICER1, FANCC, FH, FLCN, GALNT12, KIF1B, LZTR1, MAX, MEN1, MET, MLH1*, MSH2*, MSH3, MSH6*, MUTYH*, NBN, NF1*, NF2, NTHL1, PALB2*, PHOX2B, PMS2*, POT1, PRKAR1A, PTCH1, PTEN*, RAD51C*, RAD51D*,RB1, RECQL, RET, SDHA, SDHAF2, SDHB, SDHC, SDHD, SMAD4, SMARCA4, SMARCB1, SMARCE1, STK11, SUFU, TMEM127, TP53*,TSC1, TSC2, VHL and XRCC2 (sequencing and deletion/duplication); EGFR, EGLN1, HOXB13, KIT, MITF, PDGFRA, POLD1 and POLE (sequencing only); EPCAM and GREM1 (deletion/duplication only).   11/15/2021 Surgery   She underwent left simple mastectomy and right modified mastectomy.   Pathology # Left breast, invasive lobular carcinoma Grade 2, with back ground neoplasia [LCIS and atypical lobular hyperplasia], benign intraductal papilloma, negative margins, Left axillary SLNB 2/2 involved with metastatic carcinoma, extranodal  extension.  pT2 pN1a, ER +90%, PR +90%, HER2 negative (1+)  # Right Breast invasive lobular carcinoma Grade 2, negative margins, right axillary lymph nodes  30/32 involved with metastatic carcinoma, pT1c pN3a ER +90%, PR +51-90%, HER2  negative (1+)    11/25/2021 Cancer Staging   Staging form: Breast, AJCC 8th Edition - Pathologic stage from 11/25/2021: Stage IB (pT2, pN1, cM0, G2, ER+, PR+, HER2-) - Signed by Sharon Patience, MD on 11/25/2021 Stage prefix: Initial diagnosis Multigene prognostic tests performed: None Histologic grading system: 3 grade system   01/09/2022 - 03/10/2022 Radiation Therapy   Adjuvant breast Radiation.    07/25/2022 Cancer Staging   Staging form: Breast, AJCC 8th Edition - Pathologic stage from 07/25/2022: Stage IV (rpTX, pNX, pM1, ER+, PR: Not Assessed, HER2-) - Signed by Sharon Patience, MD on 08/14/2022 Stage prefix: Recurrence Multigene prognostic tests performed: None   07/25/2022 Procedure   Colonoscopy showed Findings - Six 5 to 6 mm polyps in the ascending colon, removed with a cold snare. Resected and retrieved. - One 10 mm polyp in the descending colon, removed with a cold snare. Resected and retrieved. Clip was placed. - One 30 mm polyp in the rectum, removed with a hot snare. Resected and retrieved. Clips were placed.  Pathology showed A.  COLON POLYP X 6, ASCENDING AND CECUM; COLD SNARE: - COLONIC MUCOSA WITH METASTATIC LOBULAR BREAST CARCINOMA (3) - HYPERPLASTIC POLYP (3)  B.  COLON POLYP, DESCENDING; COLD SNARE: - TUBULAR ADENOMA. - NEGATIVE FOR HIGH-GRADE DYSPLASIA AND MALIGNANCY.  C.  RECTUM POLYP; HOT SNARE: - TUBULOVILLOUS ADENOMA. - NEGATIVE FOR HIGH-GRADE DYSPLASIA AND MALIGNANCY.  Comment: The metastatic lobular breast carcinoma is positive for GATA-3 and ER (strong staining in greater than 90% of the tumor cells). HER2 negative (1+)     Malignant neoplasm of lower-outer quadrant of right breast of female, estrogen receptor positive (HCC)  08/06/2021 Mammogram   08/06/2021, digital bilateral mammogram and ultrasound showed left breast 3:00 mass, 1.7 x 1.7 x 1.7 cm, ultrasound of the left axillary is negative. 08/21/2021 right breast 1 x 0.7 x 0.8 cm angulated spiculated mass at  the right breast 7:00, 5 cm from nipple.  Ultrasound of the right axillary demonstrates 3 abnormal thickened cortex lymph node. 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 Bary Richard right breast 7:00, invasive mammary carcinoma with lobular features, in situ carcinoma,  ER+, PR+, HER 2-This was found to be concordant by Dr. Bary Richard. right axilla lymph node +, extracapsular extension.  -This was found to be concordant by Dr. Bary Richard   08/28/2021 Initial Diagnosis   Malignant neoplasm of lower-outer quadrant of right breast of female, estrogen receptor positive (HCC)   08/28/2021 Imaging   CT CHEST, ABDOMEN, AND PELVIS WITH CONTRAST Asymmetric mildly enlarged right axillary nodes. Correlate with biopsy. There are some punctate foci of sclerosis in the included spine that could reflect subtle metastatic disease. Nonobstructing bilateral renal calculi.   08/29/2021 Imaging   BILATERAL BREAST MRI WITH AND WITHOUT CONTRAST 1. 2.5 centimeter enhancing mass in the LOWER OUTER QUADRANT of the RIGHT breast, correlating with known malignancy. 2. At least 4 enlarged RIGHT axillary lymph nodes, correlating well with recently biopsied lymph node showing metastatic disease. 3. 3.4 centimeter enhancing mass in the LEFT breast, with an additional 2.9 centimeters of non mass enhancement  posterior to the mass also suspicious for malignancy. This likely correlates with the area calcifications seen mammographically. 4. 5 millimeters satellite nodule along the LATERAL aspect of known malignancy in the LOWER OUTER QUADRANT of the LEFT breast. 5. LEFT axilla is negative for adenopathy.       09/05/2021 Imaging   NUCLEAR MEDICINE WHOLE BODY BONE SCAN Uptake at L2 which is nonspecific but may be related to advanced degenerative disc and  facet disease changes at both L1-L2 and L2-L3; no evidence of osseous metastatic disease by CT. No definite osseous metastatic lesions identified.   09/09/2021 Oncotype testing   [id number] block RIGHT 7:00 5 CM FN; ULTRASOUND-GUIDED BIOPSY Oncotype Dx RS score 22    Genetic Testing   Negative genetic testing. No pathogenic variants identified on the [facility] CancerNext-Expanded+RNA panel. The report date is 10/03/2021.  The CancerNext-Expanded + RNAinsight gene panel offered by [employer] and includes sequencing and rearrangement analysis for the following 77 genes: IP, ALK, APC*, ATM*, AXIN2, BAP1, BARD1, BLM, BMPR1A, BRCA1*, BRCA2*, BRIP1*, CDC73, CDH1*,CDK4, CDKN1B, CDKN2A, CHEK2*, CTNNA1, DICER1, FANCC, FH, FLCN, GALNT12, KIF1B, LZTR1, MAX, MEN1, MET, MLH1*, MSH2*, MSH3, MSH6*, MUTYH*, NBN, NF1*, NF2, NTHL1, PALB2*, PHOX2B, PMS2*, POT1, PRKAR1A, PTCH1, PTEN*, RAD51C*, RAD51D*,RB1, RECQL, RET, SDHA, SDHAF2, SDHB, SDHC, SDHD, SMAD4, SMARCA4, SMARCB1, SMARCE1, STK11, SUFU, TMEM127, TP53*,TSC1, TSC2, VHL and XRCC2 (sequencing and deletion/duplication); EGFR, EGLN1, HOXB13, KIT, MITF, PDGFRA, POLD1 and POLE (sequencing only); EPCAM and GREM1 (deletion/duplication only).   11/15/2021 Surgery   She underwent left simple mastectomy and right modified mastectomy.   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  pT2 pN1a, ER +90%, PR +90%, HER2 negative (1+)  # Right Breast invasive lobular carcinoma Grade 2, negative margins, right axillary lymph nodes  30/32 involved with metastatic carcinoma, pT1c pN3a ER +90%, PR +51-90%, HER2 negative (1+)    11/25/2021 Cancer Staging   Staging form: Breast, AJCC 8th Edition - Pathologic stage from 11/25/2021: Stage IIIA (pT1c, pN3a, cM0, G2, ER+,  PR+, HER2-) - Signed by Sharon Patience, MD on 11/25/2021 Stage prefix: Initial diagnosis Multigene prognostic tests performed: None Histologic grading system: 3 grade system   12/10/2021 Imaging   PET scan  1. Interval bilateral mastectomy and right axillary node dissection.No evidence of residual disease in the chest wall, nodal metastases or distant metastases. 2. Focal hypermetabolic activity in the proximal rectum corresponding with an intraluminal polypoid lesion, suspicious for a villous adenoma or early colon cancer. Sigmoidoscopy/colonoscopy recommended unless recently performed   12/10/2021 Imaging   1. Interval bilateral mastectomy and right axillary node dissection.No evidence of residual disease in the chest wall, nodal metastasesor distant metastases. 2. Focal hypermetabolic activity in the proximal rectum corresponding with an intraluminal polypoid lesion, suspicious for a villous adenoma or early colon cancer. Sigmoidoscopy/colonoscopy recommended unless recently performed    She is a poor historian. History of major depression, psychosis, previously on olanzapine and Remeron not currently on any and if not currently following up with psychiatrist. Patient's family history is positive for sister with breast cancer   INTERVAL HISTORY Sharon Woods is a 62 y.o. female who has above history reviewed by me today presents for follow up visit for management of bilateral breast cancer She has been on Lexapro since Oct 2023.  Take Arimidex 1mg  daily since Dec 2023, she reports some muscle/joint pain but overall  manageable.  She tolerates Fulvestrant + Occasional headache.   Review of Systems  Constitutional:  Negative for appetite change, chills, fatigue and fever.  HENT:   Negative for hearing loss and voice change.   Eyes:  Negative for eye problems.  Respiratory:  Negative for chest tightness and cough.   Cardiovascular:  Negative for chest pain.  Gastrointestinal:  Negative  for abdominal distention, abdominal pain and blood in stool.  Endocrine: Negative for hot flashes.  Genitourinary:  Negative for difficulty urinating and frequency.   Musculoskeletal:  Negative for arthralgias.  Skin:  Negative for itching and rash.  Neurological:  Negative for extremity weakness.  Hematological:  Negative for adenopathy.  Psychiatric/Behavioral:  Negative for confusion.     MEDICAL HISTORY:  Past Medical History:  Diagnosis Date   Anxiety    Cancer (HCC)    Depression    Diabetes mellitus without complication (HCC)    History of high cholesterol 2000   Hyperlipidemia    Hypertension    Patient denies medical problems    Thrombocytopenia (HCC)     SURGICAL HISTORY: Past Surgical History:  Procedure Laterality Date   ABDOMINAL HYSTERECTOMY     AXILLARY SENTINEL NODE BIOPSY Left 11/15/2021   Procedure: AXILLARY SENTINEL NODE BIOPSY;  Surgeon: Campbell Lerner, MD;  Location: [facility] ORS;  Service: General;  Laterality: Left;   BREAST BIOPSY Right 08/21/2021   [place] bx 7:00 mass coil clip path pending   BREAST BIOPSY Right 08/21/2021   [place] bx of LN, hydro marker, path pending   BREAST BIOPSY Left 08/21/2021   [place] bx, heart marker, path pending   CESAREAN SECTION     x2   COLONOSCOPY WITH PROPOFOL N/A 07/25/2022   Procedure: COLONOSCOPY WITH PROPOFOL;  Surgeon: Wyline Mood, MD;  Location: [facility] ENDOSCOPY;  Service: Gastroenterology;  Laterality: N/A;   MASTECTOMY     MASTECTOMY MODIFIED RADICAL Right 11/15/2021   Procedure: MASTECTOMY MODIFIED RADICAL;  Surgeon: Campbell Lerner, MD;  Location: [facility] ORS;  Service: General;  Laterality: Right;   NO PAST SURGERIES     TOTAL MASTECTOMY Left 11/15/2021   Procedure: TOTAL MASTECTOMY;  Surgeon: Campbell Lerner, MD;  Location: [facility] ORS;  Service: General;  Laterality: Left;    SOCIAL HISTORY: Social History   Socioeconomic History   Marital status: Married    Spouse name: Not on file   Number of children: Not on  file   Years of education: Not on file   Highest education level: Not on file  Occupational History   Not on file  Tobacco Use   Smoking status: Former    Current packs/day: 0.00    Types: Cigarettes    Quit date: 2013    Years since quitting: 11.5    Passive exposure: Current   Smokeless tobacco: Never  Vaping Use   Vaping status: Never Used  Substance and Sexual Activity   Alcohol use: Not Currently    Comment: occasionally   Drug use: No   Sexual activity: Not Currently    Comment: unable to assess   Other Topics Concern   Not on file  Social History Narrative   Not on file   Social Determinants of Health   Financial Resource Strain: Medium Risk (10/10/2021)   Overall Financial Resource Strain (CARDIA)    Difficulty of Paying Living Expenses: Somewhat hard  Food Insecurity: No Food Insecurity (10/10/2021)   Hunger Vital Sign    Worried About Running Out of Food in the Last Year: Never  true    Ran Out of Food in the Last Year: Never true  Transportation Needs: Unmet Transportation Needs (01/21/2022)   PRAPARE - Transportation    Lack of Transportation (Medical): Yes    Lack of Transportation (Non-Medical): Yes  Physical Activity: Inactive (10/10/2021)   Exercise Vital Sign    Days of Exercise per Week: 0 days    Minutes of Exercise per Session: 0 min  Stress: Stress Concern Present (10/10/2021)   [employer] of Occupational Health - Occupational Stress Questionnaire    Feeling of Stress : To some extent  Social Connections: Socially Isolated (10/10/2021)   Social Connection and Isolation Panel [NHANES]    Frequency of Communication with Friends and Family: Once a week    Frequency of Social Gatherings with Friends and Family: Never    Attends Religious Services: Never    Diplomatic Services operational officer: No    Attends Engineer, structural: Never    Marital Status: Married  Catering manager Violence: Not on file    FAMILY HISTORY: Family  History  Problem Relation Age of Onset   Lung cancer Mother    Dementia Mother    Parkinson's disease Father    Cancer Father        unk type   Diabetes Sister    Heart attack Sister    Breast cancer Sister    Cancer Maternal Uncle        unk types   Dementia Maternal Grandmother    Cancer Maternal Grandmother        unk type   Cancer Other    Dementia Other     ALLERGIES:  is allergic to tylenol [acetaminophen] and aleve [naproxen].  MEDICATIONS:  Current Outpatient Medications  Medication Sig Dispense Refill   ACCU-CHEK GUIDE test strip USE TO CHECK BLOOD SUGAR UP TO 4 TIMES DAILY AS DIRECTED 100 each 0   Accu-Chek Softclix Lancets lancets USE TO CHECK BLOOD SUAGR UP TO 4 TIMES DAILY AS DIRECTED 100 each 0   anastrozole (ARIMIDEX) 1 MG tablet Take 1 tablet (1 mg total) by mouth daily. 90 tablet 1   blood glucose meter kit and supplies KIT Dispense based on patient and insurance preference. Use up to four times daily as directed. 1 each 1   calcium carbonate (OS-CAL) 600 MG TABS tablet Take 2 tablets (1,200 mg total) by mouth daily. 60 tablet 5   escitalopram (LEXAPRO) 10 MG tablet Take 1 tablet (10 mg total) by mouth daily. 90 tablet 0   hydrOXYzine (ATARAX) 10 MG tablet Take 1 tablet (10 mg total) by mouth 2 (two) times daily as needed for itching or anxiety. 30 tablet 2   metFORMIN (GLUCOPHAGE) 500 MG tablet Take 1 tablet (500 mg total) by mouth daily. 90 tablet 1   potassium chloride SA (KLOR-CON M) 20 MEQ tablet Take 1 tablet (20 mEq total) by mouth daily. 3 tablet 0   triamcinolone ointment (KENALOG) 0.5 % Apply 1 Application topically 2 (two) times daily. 30 g 1   cyanocobalamin (VITAMIN B12) 1000 MCG tablet Take 1 tablet (1,000 mcg total) by mouth daily. 30 tablet 2   No current facility-administered medications for this visit.     PHYSICAL EXAMINATION: ECOG PERFORMANCE STATUS: 1 - Symptomatic but completely ambulatory Vitals:   10/14/22 1340  BP: 128/84  Pulse:  80  Resp: 18  Temp: (!) 97.5 F (36.4 C)  SpO2: 99%   Filed Weights   10/14/22 1340  Weight:  169 lb 3.2 oz (76.7 kg)    Physical Exam Constitutional:      General: She is not in acute distress. HENT:     Head: Normocephalic and atraumatic.  Eyes:     General: No scleral icterus. Cardiovascular:     Rate and Rhythm: Normal rate.  Pulmonary:     Effort: Pulmonary effort is normal. No respiratory distress.     Breath sounds: No wheezing.  Abdominal:     General: There is no distension.  Musculoskeletal:        General: No deformity. Normal range of motion.     Cervical back: Normal range of motion and neck supple.  Skin:    Coloration: Skin is not jaundiced.  Neurological:     Mental Status: She is alert and oriented to person, place, and time. Mental status is at baseline.     LABORATORY DATA:  I have reviewed the data as listed    Latest Ref Rng & Units 10/14/2022    1:30 PM 09/16/2022    1:19 PM 08/14/2022   12:14 PM  CBC  WBC 4.0 - 10.5 K/uL 4.4  2.8  3.6   Hemoglobin 12.0 - 15.0 g/dL 08.6  57.8  46.9   Hematocrit 36.0 - 46.0 % 37.8  37.0  38.6   Platelets 150 - 400 K/uL 67  53  66       Latest Ref Rng & Units 10/14/2022    1:31 PM 09/16/2022    1:19 PM 07/23/2022   10:46 AM  CMP  Glucose 70 - 99 mg/dL 629  528  413   BUN 8 - 23 mg/dL 15  18  15    Creatinine 0.44 - 1.00 mg/dL 2.44  0.10  2.72   Sodium 135 - 145 mmol/L 137  139  138   Potassium 3.5 - 5.1 mmol/L 3.7  3.3  3.6   Chloride 98 - 111 mmol/L 104  105  107   CO2 22 - 32 mmol/L 23  26  24    Calcium 8.9 - 10.3 mg/dL 9.2  9.1  8.7   Total Protein 6.5 - 8.1 g/dL 7.5  7.1  6.7   Total Bilirubin 0.3 - 1.2 mg/dL 0.8  0.7  0.7   Alkaline Phos 38 - 126 U/L 63  61  60   AST 15 - 41 U/L 26  22  23    ALT 0 - 44 U/L 16  16  17      RADIOGRAPHIC STUDIES: I have personally reviewed the radiological images as listed and agreed with the findings in the report. MR Brain W Wo Contrast  Result Date:  09/18/2022 CLINICAL DATA:  Provided history: Malignant neoplasm of upper-outer quadrant of left breast in female, estrogen receptor positive. Breast cancer staging. Headaches. EXAM: MRI HEAD WITHOUT AND WITH CONTRAST TECHNIQUE: Multiplanar, multiecho pulse sequences of the brain and surrounding structures were obtained without and with intravenous contrast. CONTRAST:  7.28mL GADAVIST GADOBUTROL 1 MMOL/ML IV SOLN COMPARISON:  PET CT 08/06/2022.  Head CT 08/28/2015. FINDINGS: Brain: No age advanced or lobar predominant parenchymal atrophy. Mild multifocal T2 FLAIR hyperintense signal abnormality within the cerebral white matter, nonspecific but most often secondary to chronic small vessel ischemia. There is no acute infarct. No evidence of an intracranial mass. No chronic intracranial blood products. No extra-axial fluid collection. No midline shift. No pathologic intracranial enhancement identified. Vascular: Maintained flow voids within the proximal large arterial vessels. Skull and upper cervical spine: No focal suspicious  marrow lesion. Sinuses/Orbits: No mass or acute finding within the imaged orbits. Mild mucosal thickening within the right maxillary sinus. 16 mm mucous retention cyst, and mild background mucosal thickening, within the left maxillary sinus. IMPRESSION: 1. No evidence of intracranial metastatic disease. 2. Mild multifocal T2 FLAIR hyperintense signal abnormality within the cerebral white matter, nonspecific but most often secondary to chronic small vessel ischemia. 3. Paranasal sinus disease as described. Electronically Signed   By: Jackey Loge D.O.   On: 09/18/2022 07:49   NM PET Image Restage (PS) Skull Base to Thigh (F-18 FDG)  Result Date: 08/09/2022 CLINICAL DATA:  Subsequent treatment strategy for metastatic breast cancer. EXAM: NUCLEAR MEDICINE PET SKULL BASE TO THIGH TECHNIQUE: 8.6 mCi F-18 FDG was injected intravenously. Full-ring PET imaging was performed from the skull base to  thigh after the radiotracer. CT data was obtained and used for attenuation correction and anatomic localization. Fasting blood glucose: 120 mg/dl COMPARISON:  PET-CT dated 12/09/2021 FINDINGS: Mediastinal blood pool activity: SUV max 2.5 Liver activity: SUV max NA NECK: No hypermetabolic cervical lymphadenopathy. Incidental CT findings: None. CHEST: Status post bilateral mastectomy. Radiation changes in the anterior upper lobes. No hypermetabolic pulmonary nodules. No hypermetabolic thoracic lymphadenopathy. Incidental CT findings: None. ABDOMEN/PELVIS: No abnormal hypermetabolism in the liver, spleen, pancreas, and adrenal glands. No hypermetabolic abdominopelvic lymphadenopathy. Hemostasis clip in the rectum. Prior hypermetabolism is no longer evident. Incidental CT findings: 9 mm nonobstructing left lower pole renal calculus. Atherosclerotic calcifications of the aortic arch. Status post hysterectomy. SKELETON: No focal hypermetabolic activity to suggest skeletal metastasis. Incidental CT findings: Degenerative changes of the visualized thoracolumbar spine. 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 No findings suspicious for recurrent or metastatic disease. Electronically Signed   By: Charline Bills M.D.   On: 08/09/2022 05:06

## 2022-10-14 NOTE — Assessment & Plan Note (Signed)
See above plan. 

## 2022-10-14 NOTE — Assessment & Plan Note (Addendum)
Bilateral breast invasive lobular carcinoma, s/p bilateral mastectomy. [name]   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  PET scan did not show any detectable metastatic disease.  Unclear if she has stage IV disease to start with or has developed recurrence/metastatic disease.  Recommend MRI brain w wo contrast-negative for brain metastasis-small vessel ischemia, recommend patient to discuss with PCP.  Breast cancer markers are stable. Not able to start CDK4/5 inhibitor due to her baseline cytopenia.   She is asymptomatic.  Recommend to continue Arimidex 1mg  daily, as well as  Fulvestrant injections.

## 2022-10-14 NOTE — Assessment & Plan Note (Signed)
Etiology unknown.   mild splenomegaly.  Bone marrow biopsy was reviewed and discussed with patient. Mildly hypercellular bone marrow with otherwise orderly trilineage hematopoiesis.  A definitive morphologic etiology for this pancytopenia is not noted.  Negative cytogenetics, negative myeloid FISH testing. Repeat lab work showed stable thrombocytopenia.  Continue to monitor.  Continue B12 supplementation.

## 2022-10-14 NOTE — Assessment & Plan Note (Addendum)
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  Drug induced leukopenia vs other etiologies.  B12 is at the low normal end.  Normal folate. Improved/stable counts.  Continue observation.

## 2022-10-15 ENCOUNTER — Encounter: Payer: Self-pay | Admitting: Oncology

## 2022-10-15 LAB — CANCER ANTIGEN 27.29: CA 27.29: 21.7 U/mL (ref 0.0–38.6)

## 2022-10-15 LAB — CANCER ANTIGEN 15-3: CA 15-3: 25.4 U/mL — ABNORMAL HIGH (ref 0.0–25.0)

## 2022-10-16 ENCOUNTER — Ambulatory Visit: Payer: MEDICAID | Admitting: Surgery

## 2022-10-19 NOTE — Progress Notes (Unsigned)
Established Patient Office Visit  Subjective    Patient ID: Sharon Woods, female    DOB: 07-30-1960  Age: 62 y.o. MRN: [id number]  CC:  No chief complaint on file.   HPI Sharon Woods presents to follow-up on chronic medical conditions. She is here with her husband..   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 Per chart review patient is not a candidate for adjuvant IV chemotherapy but has been on Arimidex 1 mg.  -Family history of mother, father and sister with breast cancer -MRI brain negative for brains mets, but with small vessel ischemia   Diabetes, Type 2: -Last A1c 6.2% 4/24 -Medications: Metformin 500 mg -Patient is compliant with the above medications and reports no side effects.  -Checking BG at home: 150-180 fasting -Eye exam: Due, needs referral  -Foot exam: UTD 12/23 -Microalbumin: UTD 10/23 -Statin: No -PNA vaccine: Discuss at follow-up - patient willing to have vaccine in the fall -Denies symptoms of hypoglycemia, polyuria, polydipsia, numbness extremities, foot ulcers/trauma.   HLD: -Medications: Nothing currently -Last lipid panel: Lipid Panel     Component Value Date/Time   CHOL 230 (H) 07/21/2022 1138   TRIG 202 (H) 07/21/2022 1138   HDL 62 07/21/2022 1138   CHOLHDL 3.7 07/21/2022 1138   VLDL 47 (H) 04/16/2015 0659   LDLCALC 134 (H) 07/21/2022 1138   The 10-year ASCVD risk score (Arnett DK, et al., 2019) is: 7.1%   Values used to calculate the score:     Age: 56 years     Sex: Female     Is Non-Hispanic African American: No     Diabetic: Yes     Tobacco smoker: No     Systolic Blood Pressure: 128 mmHg     Is BP treated: No     HDL Cholesterol: 62 mg/dL     Total Cholesterol: 230 mg/dL   MDD: -Mood status: stable - no changes -Current treatment: Lexapro 10 mg (increased at LOV). States it  helps but hesitant to increase dose even though anxiety still present and causing headaches occasionally  -Satisfied with current treatment?: yes -Symptom severity: moderate  -Duration of current treatment : months -Side effects: no Medication compliance: excellent compliance -Previous psychiatric medications: prozac, seroquel, and zyprexa -patient states that she has been on medications for depression in the past, however her "husband did not want her to be on them" and she felt like a "zombie" so she discontinued them however she also states that she is under a lot of stress both at home and with her health Depressed mood: yes     07/21/2022   10:34 AM 05/09/2022   12:56 PM 04/15/2022   11:27 AM 03/04/2022   11:14 AM 01/20/2022    2:14 PM  Depression screen PHQ 2/9  Decreased Interest 0 0 2 0 0  Down, Depressed, Hopeless 0 0 1 0 1  PHQ - 2 Score 0 0 3 0 1  Altered sleeping 0  0 0 2  Tired, decreased energy 0  2 0 2  Change in appetite 0  0 0 2  Feeling bad or failure about yourself  0  0 0 0  Trouble concentrating 0  0 0 0  Moving slowly or fidgety/restless 0  0 0 0  Suicidal thoughts 0  0 0 0  PHQ-9 Score 0  5 0 7  Difficult doing work/chores Not difficult at all  Not difficult at all Not difficult at all Somewhat difficult   Hypokalemia: -Currently on KCl 20 meQ for 3 days   Health maintenance: -Blood work due -Colon cancer screening due  Outpatient Encounter Medications as of 10/20/2022  Medication Sig   ACCU-CHEK GUIDE test strip USE TO CHECK BLOOD SUGAR UP TO 4 TIMES DAILY AS DIRECTED   Accu-Chek Softclix Lancets lancets USE TO CHECK BLOOD SUAGR UP TO 4 TIMES DAILY AS DIRECTED   anastrozole (ARIMIDEX) 1 MG tablet Take 1 tablet (1 mg total) by mouth daily.   blood glucose meter kit and supplies KIT Dispense based on patient and insurance preference. Use up to four times daily as directed.   calcium carbonate (OS-CAL) 600 MG TABS tablet Take 2 tablets (1,200 mg total) by  mouth daily.   cyanocobalamin (VITAMIN B12) 1000 MCG tablet Take 1 tablet (1,000 mcg total) by mouth daily.   escitalopram (LEXAPRO) 10 MG tablet Take 1 tablet (10 mg total) by mouth daily.   hydrOXYzine (ATARAX) 10 MG tablet Take 1 tablet (10 mg total) by mouth 2 (two) times daily as needed for itching or anxiety.   metFORMIN (GLUCOPHAGE) 500 MG tablet Take 1 tablet (500 mg total) by mouth daily.   potassium chloride SA (KLOR-CON M) 20 MEQ tablet Take 1 tablet (20 mEq total) by mouth daily.   triamcinolone ointment (KENALOG) 0.5 % Apply 1 Application topically 2 (two) times daily.   No facility-administered encounter medications on file as of 10/20/2022.    Past Medical History:  Diagnosis Date   Anxiety    Cancer (HCC)    Depression    Diabetes mellitus without complication (HCC)    History of high cholesterol 2000   Hyperlipidemia    Hypertension    Patient denies medical problems    Thrombocytopenia (HCC)     Past Surgical History:  Procedure Laterality Date   ABDOMINAL HYSTERECTOMY     AXILLARY SENTINEL NODE BIOPSY Left 11/15/2021   Procedure: AXILLARY SENTINEL NODE BIOPSY;  Surgeon: Campbell Lerner, MD;  Location: [facility] ORS;  Service: General;  Laterality: Left;   BREAST BIOPSY Right 08/21/2021   [place] bx 7:00 mass coil clip path pending   BREAST BIOPSY Right 08/21/2021   [place] bx of LN, hydro marker, path pending   BREAST BIOPSY Left 08/21/2021   [place] bx, heart marker, path pending   CESAREAN SECTION     x2   COLONOSCOPY WITH PROPOFOL N/A 07/25/2022   Procedure: COLONOSCOPY WITH PROPOFOL;  Surgeon: Wyline Mood, MD;  Location: [facility] ENDOSCOPY;  Service: Gastroenterology;  Laterality: N/A;   MASTECTOMY     MASTECTOMY MODIFIED RADICAL Right 11/15/2021   Procedure: MASTECTOMY MODIFIED RADICAL;  Surgeon: Campbell Lerner, MD;  Location: [facility] ORS;  Service: General;  Laterality: Right;   NO PAST SURGERIES     TOTAL MASTECTOMY Left 11/15/2021   Procedure: TOTAL MASTECTOMY;  Surgeon:  Campbell Lerner, MD;  Location: [facility] ORS;  Service: General;  Laterality: Left;    Family History  Problem Relation Age of Onset   Lung cancer Mother    Dementia Mother    Parkinson's disease Father    Cancer Father        unk type   Diabetes Sister    Heart attack Sister    Breast cancer Sister    Cancer Maternal Uncle        unk types   Dementia Maternal Grandmother    Cancer Maternal Grandmother  unk type   Cancer Other    Dementia Other     Social History   Socioeconomic History   Marital status: Married    Spouse name: Not on file   Number of children: Not on file   Years of education: Not on file   Highest education level: Not on file  Occupational History   Not on file  Tobacco Use   Smoking status: Former    Current packs/day: 0.00    Types: Cigarettes    Quit date: 2013    Years since quitting: 11.5    Passive exposure: Current   Smokeless tobacco: Never  Vaping Use   Vaping status: Never Used  Substance and Sexual Activity   Alcohol use: Not Currently    Comment: occasionally   Drug use: No   Sexual activity: Not Currently    Comment: unable to assess   Other Topics Concern   Not on file  Social History Narrative   Not on file   Social Determinants of Health   Financial Resource Strain: Medium Risk (10/10/2021)   Overall Financial Resource Strain (CARDIA)    Difficulty of Paying Living Expenses: Somewhat hard  Food Insecurity: No Food Insecurity (10/10/2021)   Hunger Vital Sign    Worried About Running Out of Food in the Last Year: Never true    Ran Out of Food in the Last Year: Never true  Transportation Needs: Unmet Transportation Needs (01/21/2022)   PRAPARE - Transportation    Lack of Transportation (Medical): Yes    Lack of Transportation (Non-Medical): Yes  Physical Activity: Inactive (10/10/2021)   Exercise Vital Sign    Days of Exercise per Week: 0 days    Minutes of Exercise per Session: 0 min  Stress: Stress Concern Present  (10/10/2021)   [employer] of Occupational Health - Occupational Stress Questionnaire    Feeling of Stress : To some extent  Social Connections: Socially Isolated (10/10/2021)   Social Connection and Isolation Panel [NHANES]    Frequency of Communication with Friends and Family: Once a week    Frequency of Social Gatherings with Friends and Family: Never    Attends Religious Services: Never    Database administrator or Organizations: No    Attends Banker Meetings: Never    Marital Status: Married  Catering manager Violence: Not on file    Review of Systems  Constitutional:  Negative for chills and fever.  Eyes:  Negative for blurred vision.  Respiratory:  Negative for shortness of breath.   Cardiovascular:  Negative for chest pain.  Neurological:  Positive for headaches.  Psychiatric/Behavioral:  The patient is nervous/anxious.       Objective    LMP  (LMP Unknown) Comment: Patient is not a good historian  Physical Exam Constitutional:      Appearance: Normal appearance.  HENT:     Head: Normocephalic and atraumatic.  Eyes:     Conjunctiva/sclera: Conjunctivae normal.  Cardiovascular:     Rate and Rhythm: Normal rate and regular rhythm.  Pulmonary:     Effort: Pulmonary effort is normal.     Breath sounds: Normal breath sounds.  Skin:    General: Skin is warm and dry.     Findings: Rash present.  Neurological:     General: No focal deficit present.     Mental Status: She is alert. Mental status is at baseline.  Psychiatric:        Mood and Affect: Mood normal.  Assessment & Plan:   1. Type 2 diabetes mellitus with other specified complication, without long-term current use of insulin: Controlled, recheck A1c and kidney function today.  Referral to ophthalmology for diabetic eye exam placed.  Patient doing well on metformin 500 mg once daily, refilled.  - Basic Metabolic Panel (BMET) - HgB A1c - metFORMIN (GLUCOPHAGE) 500 MG tablet;  Take 1 tablet (500 mg total) by mouth daily.  Dispense: 90 tablet; Refill: 1 - Ambulatory referral to Ophthalmology  2. Moderate episode of recurrent major depressive disorder/Anxiety: Stable, patient has been having increased stress due to health conditions, however she is hesitant to increase Lexapro dose further.  Will continue Lexapro 10 mg daily, refilled.  Will also refill hydroxyzine 10 mg to take as needed for anxiety and/or itching.  - escitalopram (LEXAPRO) 10 MG tablet; Take 1 tablet (10 mg total) by mouth daily.  Dispense: 90 tablet; Refill: 0 - hydrOXYzine (ATARAX) 10 MG tablet; Take 1 tablet (10 mg total) by mouth 2 (two) times daily as needed for itching or anxiety.  Dispense: 30 tablet; Refill: 2  3. Hyperlipidemia, unspecified hyperlipidemia type: Will obtain a lipid panel today.  Not currently on a statin.  - Lipid Profile  4. Bilateral malignant neoplasm of breast in female, unspecified estrogen receptor status, unspecified site of breast: Following with medical oncology, note reviewed from 06/24/2022.  Patient is currently on Arimidex 1 mg daily.  Patient recently underwent bone marrow biopsy for thrombopenia has an appointment to discuss with oncology on Wednesday.   No follow-ups on file.   Margarita Mail, DO

## 2022-10-20 ENCOUNTER — Ambulatory Visit
Admission: RE | Admit: 2022-10-20 | Discharge: 2022-10-20 | Disposition: A | Discharge status: Home / Self Care | Payer: MEDICAID | Source: Ambulatory Visit | Attending: Radiation Oncology | Admitting: Radiation Oncology

## 2022-10-20 ENCOUNTER — Encounter: Payer: Self-pay | Admitting: Internal Medicine

## 2022-10-20 ENCOUNTER — Encounter: Payer: Self-pay | Admitting: Oncology

## 2022-10-20 ENCOUNTER — Ambulatory Visit (INDEPENDENT_AMBULATORY_CARE_PROVIDER_SITE_OTHER): Payer: MEDICAID | Admitting: Internal Medicine

## 2022-10-20 ENCOUNTER — Encounter: Payer: Self-pay | Admitting: Radiation Oncology

## 2022-10-20 VITALS — BP 146/93 | HR 66 | Temp 98.1°F | Resp 16 | Wt 169.7 lb

## 2022-10-20 VITALS — BP 124/76 | HR 73 | Temp 97.9°F | Resp 18 | Ht 61.0 in | Wt 172.0 lb

## 2022-10-20 DIAGNOSIS — C50912 Malignant neoplasm of unspecified site of left female breast: Secondary | ICD-10-CM

## 2022-10-20 DIAGNOSIS — C50511 Malignant neoplasm of lower-outer quadrant of right female breast: Secondary | ICD-10-CM | POA: Diagnosis not present

## 2022-10-20 DIAGNOSIS — F331 Major depressive disorder, recurrent, moderate: Secondary | ICD-10-CM | POA: Diagnosis not present

## 2022-10-20 DIAGNOSIS — Z170 Estrogen receptor positive status [ER+]: Secondary | ICD-10-CM

## 2022-10-20 DIAGNOSIS — Z923 Personal history of irradiation: Secondary | ICD-10-CM | POA: Diagnosis not present

## 2022-10-20 DIAGNOSIS — F419 Anxiety disorder, unspecified: Secondary | ICD-10-CM

## 2022-10-20 DIAGNOSIS — E785 Hyperlipidemia, unspecified: Secondary | ICD-10-CM | POA: Diagnosis not present

## 2022-10-20 DIAGNOSIS — C50412 Malignant neoplasm of upper-outer quadrant of left female breast: Secondary | ICD-10-CM | POA: Insufficient documentation

## 2022-10-20 DIAGNOSIS — E1169 Type 2 diabetes mellitus with other specified complication: Secondary | ICD-10-CM

## 2022-10-20 LAB — POCT GLYCOSYLATED HEMOGLOBIN (HGB A1C): Hemoglobin A1C: 6.1 % — AB (ref 4.0–5.6)

## 2022-10-20 MED ORDER — ESCITALOPRAM OXALATE 10 MG PO TABS: 10.0000 mg | ORAL_TABLET | Freq: Every day | ORAL | 1 refills | Status: DC

## 2022-10-20 MED ORDER — ROSUVASTATIN CALCIUM 5 MG PO TABS
5.0000 mg | ORAL_TABLET | Freq: Every day | ORAL | 1 refills | Status: DC
Start: 2022-10-20 — End: 2023-04-17

## 2022-10-20 MED ORDER — METFORMIN HCL 500 MG PO TABS
500.0000 mg | ORAL_TABLET | Freq: Every day | ORAL | 1 refills | Status: DC
Start: 2022-10-20 — End: 2023-04-17

## 2022-10-20 MED ORDER — HYDROXYZINE HCL 10 MG PO TABS
10.0000 mg | ORAL_TABLET | Freq: Two times a day (BID) | ORAL | 1 refills | Status: DC | PRN
Start: 2022-10-20 — End: 2023-04-17

## 2022-10-20 NOTE — Progress Notes (Signed)
Radiation Oncology Follow up Note  Name: Sharon Woods   Date:   10/20/2022 MRN:  [id number] DOB: 02/08/1961    This 62 y.o. female presents to the clinic today for 27-month follow-up status post status post radiation therapy to her left chest wall status post left modified radical mastectomy for stage IIb (pT2 N1 M0) ER/PR positive invasive mammary carcinoma to her left breast as well as a stage IIIc (T1 N3 M0) grade 2 ER/PR positive invasive mammary carcinoma of her right breast status post bilateral mastectomies and bilateral chest wall peripheral lymphatic radiation.  REFERRING PROVIDER: Margarita Mail, DO  HPI: Patient is a 62 year old female now seen out 7 months having completed bilateral chest wall and peripheral lymphatic radiation therapy for stage IIb of her left breast and stage IIIc of her right breast.  Seen today in routine follow-up she is doing well still having some problems with tenderness in her scar tissue mostly on the left side.  She specifically denies any new nodularity or masses..  She otherwise is asymptomatic at this time.  COMPLICATIONS OF TREATMENT: none  FOLLOW UP COMPLIANCE: keeps appointments   PHYSICAL EXAM:  BP (!) 146/93   Pulse 66   Temp 98.1 F (36.7 C) (Tympanic)   Resp 16   Wt 169 lb 11.2 oz (77 kg)   LMP  (LMP Unknown) Comment: Patient is not a good historian  BMI 32.06 kg/m  She status post bilateral mastectomies chest wall is clear without evidence of nodularity or mass.  No axillary or supraclavicular adenopathy appreciated.  No evidence of lymphedema of her upper extremities is noted.  Well-developed well-nourished patient in NAD. HEENT reveals PERLA, EOMI, discs not visualized.  Oral cavity is clear. No oral mucosal lesions are identified. Neck is clear without evidence of cervical or supraclavicular adenopathy. Lungs are clear to A&P. Cardiac examination is essentially unremarkable with regular rate and rhythm without murmur rub or thrill.  Abdomen is benign with no organomegaly or masses noted. Motor sensory and DTR levels are equal and symmetric in the upper and lower extremities. Cranial nerves II through XII are grossly intact. Proprioception is intact. No peripheral adenopathy or edema is identified. No motor or sensory levels are noted. Crude visual fields are within normal range.  RADIOLOGY RESULTS: 6 PET scan MRI scan of the brain reviewed compatible with above-stated findings  PLAN: Present time patient is being followed for possible stage IV disease by medical oncology.  I am going to turn follow-up care over to medical oncology.  I would be happy to reevaluate the patient anytime should further palliative treatment be indicated.  Patient is to call with any concerns.  I would like to take this opportunity to thank you for allowing me to participate in the care of your patient.Carmina Miller, MD

## 2022-10-24 ENCOUNTER — Other Ambulatory Visit: Payer: Medicaid Other

## 2022-10-24 ENCOUNTER — Ambulatory Visit: Payer: Medicaid Other | Admitting: Oncology

## 2022-10-28 ENCOUNTER — Encounter: Payer: Self-pay | Admitting: Surgery

## 2022-10-28 ENCOUNTER — Ambulatory Visit (INDEPENDENT_AMBULATORY_CARE_PROVIDER_SITE_OTHER): Payer: MEDICAID | Admitting: Surgery

## 2022-10-28 VITALS — BP 140/87 | HR 88 | Temp 98.7°F | Ht 61.0 in | Wt 171.0 lb

## 2022-10-28 DIAGNOSIS — Z170 Estrogen receptor positive status [ER+]: Secondary | ICD-10-CM

## 2022-10-28 DIAGNOSIS — C50511 Malignant neoplasm of lower-outer quadrant of right female breast: Secondary | ICD-10-CM

## 2022-10-28 DIAGNOSIS — C50412 Malignant neoplasm of upper-outer quadrant of left female breast: Secondary | ICD-10-CM

## 2022-10-28 NOTE — Progress Notes (Signed)
[facility] POST-OP OFFICE VISIT  10/28/2022  HPI: Sharon Woods is a 62 y.o. female 1 yr nearly s/p bilateral mastectomies for advanced breast Ca.  She's undergone radiation, and PT.  She c/o aches and pains, itching and headaches.   Vital signs: BP (!) 140/87   Pulse 88   Temp 98.7 F (37.1 C) (Oral)   Ht 5\' 1"  (1.549 m)   Wt 171 lb (77.6 kg)   LMP  (LMP Unknown) Comment: Patient is not a good historian  SpO2 97%   BMI 32.31 kg/m    Physical Exam: Constitutional: appears at her baseline. ` Skin: XRT changes, no suspicious nodularity to chest wall.   Assessment/Plan: This is a 62 y.o. female nearly 1 year s/p bilat mastectomies for advanced breast Ca.   Patient Active Problem List   Diagnosis Date Noted   Abnormal CT scan 07/25/2022   Adenomatous polyp of colon 07/25/2022   Hypokalemia 06/24/2022   Anxiety 05/25/2022   Leukopenia 03/20/2022   Blood in stool 03/13/2022   Abnormal gastrointestinal PET scan 01/10/2022   Diabetes mellitus (HCC) 12/05/2021   Status post bilateral mastectomy 11/21/2021   Genetic testing 10/07/2021   Vaginitis 09/28/2021   Thrombocytopenia (HCC) 09/08/2021   Malignant neoplasm of left breast (HCC) 08/28/2021   Malignant neoplasm of lower-outer quadrant of right breast of female, estrogen receptor positive (HCC) 08/28/2021   Goals of care, counseling/discussion 08/28/2021   Severe recurrent major depression with psychotic features (HCC) 11/15/2015   Hypertension 11/15/2015   Noncompliance 11/15/2015   Severe major depression, single episode, with psychotic features, mood-congruent (HCC) 03/29/2015   Protein-calorie malnutrition, severe 03/26/2015   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    - She is currently followed for possible stage IV disease by medical oncology. I am going to turn follow-up care over to them. I would be happy to reevaluate the patient anytime should further evaluation or biopsy be indicated.  Will schedule routine  f/u for 6 mos.    Campbell Lerner M.D., FACS 10/28/2022, 2:41 PM

## 2022-10-28 NOTE — Patient Instructions (Signed)
If you have any concerns or questions, please feel free to call our office. Follow up in 6 months.   Breast Self-Awareness Breast self-awareness is knowing how your breasts look and feel. You need to: Check your breasts on a regular basis. Tell your doctor about any changes. Become familiar with the look and feel of your breasts. This can help you catch a breast problem while it is still small and can be treated. You should do breast self-exams even if you have breast implants. What you need: A mirror. A well-lit room. A pillow or other soft object. How to do a breast self-exam Follow these steps to do a breast self-exam: Look for changes  Take off all the clothes above your waist. Stand in front of a mirror in a room with good lighting. Put your hands down at your sides. Compare your breasts in the mirror. Look for any difference between them, such as: A difference in shape. A difference in size. Wrinkles, dips, and bumps in one breast and not the other. Look at each breast for changes in the skin, such as: Redness. Scaly areas. Skin that has gotten thicker. Dimpling. Open sores (ulcers). Look for changes in your nipples, such as: Fluid coming out of a nipple. Fluid around a nipple. Bleeding. Dimpling. Redness. A nipple that looks pushed in (retracted), or that has changed position. Feel for changes Lie on your back. Feel each breast. To do this: Pick a breast to feel. Place a pillow under the shoulder closest to that breast. Put the arm closest to that breast behind your head. Feel the nipple area of that breast using the hand of your other arm. Feel the area with the pads of your three middle fingers by making small circles with your fingers. Use light, medium, and firm pressure. Continue the overlapping circles, moving downward over the breast. Keep making circles with your fingers. Stop when you feel your ribs. Start making circles with your fingers again, this time  going upward until you reach your collarbone. Then, make circles outward across your breast and into your armpit area. Squeeze your nipple. Check for discharge and lumps. Repeat these steps to check your other breast. Sit or stand in the tub or shower. With soapy water on your skin, feel each breast the same way you did when you were lying down. Write down what you find Writing down what you find can help you remember what to tell your doctor. Write down: What is normal for each breast. Any changes you find in each breast. These include: The kind of changes you find. A tender or painful breast. Any lump you find. Write down its size and where it is. When you last had your monthly period (menstrual cycle). General tips If you are breastfeeding, the best time to check your breasts is after you feed your baby or after you use a breast pump. If you get monthly bleeding, the best time to check your breasts is 5-7 days after your monthly cycle ends. With time, you will become comfortable with the self-exam. You will also start to know if there are changes in your breasts. Contact a doctor if: You see a change in the shape or size of your breasts or nipples. You see a change in the skin of your breast or nipples, such as red or scaly skin. You have fluid coming from your nipples that is not normal. You find a new lump or thick area. You have breast pain. You  have any concerns about your breast health. Summary Breast self-awareness includes looking for changes in your breasts and feeling for changes within your breasts. You should do breast self-awareness in front of a mirror in a well-lit room. If you get monthly periods (menstrual cycles), the best time to check your breasts is 5-7 days after your period ends. Tell your doctor about any changes you see in your breasts. Changes include changes in size, changes on the skin, painful or tender breasts, or fluid from your nipples that is not  normal. This information is not intended to replace advice given to you by your health care provider. Make sure you discuss any questions you have with your health care provider. Document Revised: 08/22/2021 Document Reviewed: 01/17/2021 Elsevier Patient Education  2024 [employer].

## 2022-11-11 ENCOUNTER — Inpatient Hospital Stay: Payer: MEDICAID | Attending: Radiation Oncology

## 2022-11-11 DIAGNOSIS — Z5111 Encounter for antineoplastic chemotherapy: Secondary | ICD-10-CM | POA: Diagnosis present

## 2022-11-11 DIAGNOSIS — Z170 Estrogen receptor positive status [ER+]: Secondary | ICD-10-CM

## 2022-11-11 DIAGNOSIS — C50511 Malignant neoplasm of lower-outer quadrant of right female breast: Secondary | ICD-10-CM | POA: Diagnosis present

## 2022-11-11 DIAGNOSIS — Z79899 Other long term (current) drug therapy: Secondary | ICD-10-CM | POA: Insufficient documentation

## 2022-11-11 DIAGNOSIS — C50812 Malignant neoplasm of overlapping sites of left female breast: Secondary | ICD-10-CM | POA: Diagnosis present

## 2022-11-11 MED ORDER — FULVESTRANT 250 MG/5ML IM SOSY
500.0000 mg | PREFILLED_SYRINGE | Freq: Once | INTRAMUSCULAR | Status: AC
  Administered 2022-11-11: 500 mg via INTRAMUSCULAR
  Filled 2022-11-11: qty 10

## 2022-12-09 ENCOUNTER — Inpatient Hospital Stay: Payer: MEDICAID | Attending: Radiation Oncology

## 2022-12-09 DIAGNOSIS — C50812 Malignant neoplasm of overlapping sites of left female breast: Secondary | ICD-10-CM | POA: Insufficient documentation

## 2022-12-09 DIAGNOSIS — C50511 Malignant neoplasm of lower-outer quadrant of right female breast: Secondary | ICD-10-CM | POA: Diagnosis present

## 2022-12-09 DIAGNOSIS — Z5111 Encounter for antineoplastic chemotherapy: Secondary | ICD-10-CM | POA: Diagnosis present

## 2022-12-09 DIAGNOSIS — Z79899 Other long term (current) drug therapy: Secondary | ICD-10-CM | POA: Diagnosis not present

## 2022-12-09 DIAGNOSIS — C50412 Malignant neoplasm of upper-outer quadrant of left female breast: Secondary | ICD-10-CM

## 2022-12-09 MED ORDER — FULVESTRANT 250 MG/5ML IM SOSY
500.0000 mg | PREFILLED_SYRINGE | Freq: Once | INTRAMUSCULAR | Status: AC
  Administered 2022-12-09: 500 mg via INTRAMUSCULAR
  Filled 2022-12-09: qty 10

## 2023-01-14 ENCOUNTER — Inpatient Hospital Stay: Payer: MEDICAID | Attending: Radiation Oncology

## 2023-01-14 ENCOUNTER — Inpatient Hospital Stay: Payer: MEDICAID

## 2023-01-14 ENCOUNTER — Inpatient Hospital Stay (HOSPITAL_BASED_OUTPATIENT_CLINIC_OR_DEPARTMENT_OTHER): Payer: MEDICAID | Admitting: Oncology

## 2023-01-14 DIAGNOSIS — C50412 Malignant neoplasm of upper-outer quadrant of left female breast: Secondary | ICD-10-CM

## 2023-01-14 DIAGNOSIS — Z170 Estrogen receptor positive status [ER+]: Secondary | ICD-10-CM

## 2023-01-14 DIAGNOSIS — D72810 Lymphocytopenia: Secondary | ICD-10-CM

## 2023-01-14 DIAGNOSIS — D696 Thrombocytopenia, unspecified: Secondary | ICD-10-CM | POA: Diagnosis not present

## 2023-01-14 DIAGNOSIS — F323 Major depressive disorder, single episode, severe with psychotic features: Secondary | ICD-10-CM

## 2023-01-14 DIAGNOSIS — Z5111 Encounter for antineoplastic chemotherapy: Secondary | ICD-10-CM | POA: Diagnosis present

## 2023-01-14 DIAGNOSIS — Z79899 Other long term (current) drug therapy: Secondary | ICD-10-CM | POA: Insufficient documentation

## 2023-01-14 DIAGNOSIS — C50511 Malignant neoplasm of lower-outer quadrant of right female breast: Secondary | ICD-10-CM

## 2023-01-14 DIAGNOSIS — C50812 Malignant neoplasm of overlapping sites of left female breast: Secondary | ICD-10-CM | POA: Diagnosis present

## 2023-01-14 DIAGNOSIS — Z7689 Persons encountering health services in other specified circumstances: Secondary | ICD-10-CM

## 2023-01-14 LAB — CBC WITH DIFFERENTIAL (CANCER CENTER ONLY)
Abs Immature Granulocytes: 0.01 10*3/uL (ref 0.00–0.07)
Basophils Absolute: 0 10*3/uL (ref 0.0–0.1)
Basophils Relative: 1 %
Eosinophils Absolute: 0.1 10*3/uL (ref 0.0–0.5)
Eosinophils Relative: 2 %
HCT: 36.5 % (ref 36.0–46.0)
Hemoglobin: 12.2 g/dL (ref 12.0–15.0)
Immature Granulocytes: 0 %
Lymphocytes Relative: 16 %
Lymphs Abs: 0.6 10*3/uL — ABNORMAL LOW (ref 0.7–4.0)
MCH: 29.3 pg (ref 26.0–34.0)
MCHC: 33.4 g/dL (ref 30.0–36.0)
MCV: 87.7 fL (ref 80.0–100.0)
Monocytes Absolute: 0.2 10*3/uL (ref 0.1–1.0)
Monocytes Relative: 6 %
Neutro Abs: 2.8 10*3/uL (ref 1.7–7.7)
Neutrophils Relative %: 75 %
Platelet Count: 66 10*3/uL — ABNORMAL LOW (ref 150–400)
RBC: 4.16 MIL/uL (ref 3.87–5.11)
RDW: 14.3 % (ref 11.5–15.5)
WBC Count: 3.7 10*3/uL — ABNORMAL LOW (ref 4.0–10.5)
nRBC: 0 % (ref 0.0–0.2)

## 2023-01-14 LAB — CMP (CANCER CENTER ONLY)
ALT: 15 U/L (ref 0–44)
AST: 25 U/L (ref 15–41)
Albumin: 3.9 g/dL (ref 3.5–5.0)
Alkaline Phosphatase: 56 U/L (ref 38–126)
Anion gap: 9 (ref 5–15)
BUN: 13 mg/dL (ref 8–23)
CO2: 24 mmol/L (ref 22–32)
Calcium: 9.3 mg/dL (ref 8.9–10.3)
Chloride: 107 mmol/L (ref 98–111)
Creatinine: 0.78 mg/dL (ref 0.44–1.00)
GFR, Estimated: >60 mL/min (ref >60)
Glucose, Bld: 176 mg/dL — ABNORMAL HIGH (ref 70–99)
Potassium: 3.4 mmol/L — ABNORMAL LOW (ref 3.5–5.1)
Sodium: 140 mmol/L (ref 135–145)
Total Bilirubin: 0.6 mg/dL (ref 0.3–1.2)
Total Protein: 7.3 g/dL (ref 6.5–8.1)

## 2023-01-14 MED ORDER — FULVESTRANT 250 MG/5ML IM SOSY
500.0000 mg | PREFILLED_SYRINGE | Freq: Once | INTRAMUSCULAR | Status: AC
  Administered 2023-01-14: 500 mg via INTRAMUSCULAR
  Filled 2023-01-14: qty 10

## 2023-01-14 NOTE — Assessment & Plan Note (Signed)
See above plan.

## 2023-01-14 NOTE — Assessment & Plan Note (Signed)
Etiology unknown.   mild splenomegaly.  Bone marrow biopsy was reviewed and discussed with patient. Mildly hypercellular bone marrow with otherwise orderly trilineage hematopoiesis.  A definitive morphologic etiology for this pancytopenia is not noted.  Negative cytogenetics, negative myeloid FISH testing. Repeat lab work showed stable thrombocytopenia.  Continue to monitor.  Continue B12 supplementation.

## 2023-01-14 NOTE — Assessment & Plan Note (Addendum)
Bilateral breast invasive lobular carcinoma, s/p bilateral mastectomy. [name]   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  PET scan did not show any detectable metastatic disease.  Unclear if she has stage IV disease to start with or has developed recurrence/metastatic disease.  Repeat CT in 3 months. MRI brain w wo contrast-negative for brain metastasis-small vessel ischemia, recommend patient to discuss with PCP. Left axillary knot, I am not able to appreciate well.  Will obtain ultrasound left axilla for further evaluation.   Not able to start CDK4/5 inhibitor due to her baseline cytopenia.   Recommend to continue Arimidex 1mg  daily, as well as  Fulvestrant injections.

## 2023-01-14 NOTE — Assessment & Plan Note (Signed)
[name]  On Lexpro since Oct 2023

## 2023-01-14 NOTE — Progress Notes (Signed)
Hematology/Oncology Progress note [phone] [phone]       CHIEF COMPLAINTS/REASON FOR VISIT:  bilateral breast cancer, thrombocytopenia, leukopenia  ASSESSMENT & PLAN:   Cancer Staging  Malignant neoplasm of left breast (HCC) Staging form: Breast, AJCC 8th Edition - Pathologic stage from 11/25/2021: Stage IB (pT2, pN1, cM0, G2, ER+, PR+, HER2-) - Signed by Rickard Patience, MD on 11/25/2021 - Pathologic stage from 07/25/2022: Stage IV (rpTX, pNX, pM1, ER+, PR: Not Assessed, HER2-) - Signed by Rickard Patience, MD on 08/14/2022  Malignant neoplasm of lower-outer quadrant of right breast of female, estrogen receptor positive (HCC) Staging form: Breast, AJCC 8th Edition - Pathologic stage from 11/25/2021: Stage IIIA (pT1c, pN3a, cM0, G2, ER+, PR+, HER2-) - Signed by Rickard Patience, MD on 11/25/2021   Malignant neoplasm of left breast (HCC) Bilateral breast invasive lobular carcinoma, s/p bilateral mastectomy. [name]   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  PET scan did not show any detectable metastatic disease.  Unclear if she has stage IV disease to start with or has developed recurrence/metastatic disease.  Repeat CT in 3 months. MRI brain w wo contrast-negative for brain metastasis-small vessel ischemia, recommend patient to discuss with PCP. Left  axillary knot, I am not able to appreciate well.  Will obtain ultrasound left axilla for further evaluation.   Not able to start CDK4/5 inhibitor due to her baseline cytopenia.   Recommend to continue Arimidex 1mg  daily, as well as  Fulvestrant injections.    Malignant neoplasm of lower-outer quadrant of right breast of female, estrogen receptor positive (HCC) See above plan.  Thrombocytopenia (HCC) Etiology unknown.   mild splenomegaly.  Bone marrow biopsy was reviewed and discussed with patient. Mildly hypercellular bone marrow with otherwise orderly trilineage hematopoiesis.  A definitive morphologic etiology for this pancytopenia is not noted.  Negative cytogenetics, negative myeloid FISH testing. Repeat lab work showed stable thrombocytopenia.  Continue to monitor.  Continue B12 supplementation.     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  Drug induced leukopenia vs other etiologies.  Continue observation.   Severe major depression, single episode, with psychotic features, mood-congruent (HCC) [name]  On Lexpro since Oct 2023   Possible bedbug infestation, culture Child psychotherapist.    Orders Placed This Encounter  Procedures   CT CHEST ABDOMEN PELVIS W CONTRAST    Standing Status:   Future    Standing Expiration Date:   01/14/2024    Order Specific Question:   If indicated for the ordered procedure, I authorize the administration of contrast media per Radiology protocol    Answer:   Yes    Order Specific Question:   Does the patient have a contrast media/X-ray dye allergy?    Answer:   No    Order Specific Question:   Preferred imaging location?  Answer:   [facility]    Order Specific Question:   If indicated for the ordered procedure, I authorize the administration of oral contrast media per Radiology  protocol    Answer:   Yes   CBC with Differential (Cancer Center Only)    Standing Status:   Future    Standing Expiration Date:   01/14/2024   CMP (Cancer Center only)    Standing Status:   Future    Standing Expiration Date:   01/14/2024   Cancer antigen 27.29    Standing Status:   Future    Standing Expiration Date:   01/14/2024   Cancer antigen 15-3    Standing Status:   Future    Standing Expiration Date:   01/14/2024   Ambulatory referral to Social Work    Referral Priority:   Routine    Referral Type:   Consultation    Referral Reason:   Specialty Services Required    Number of Visits Requested:   1   Follow up  Monthly Faslodex x 2 Follow-up in 3 months [name], questions or concerns.  Rickard Patience, MD, PhD [facility] Hematology Oncology 01/14/2023     HISTORY OF PRESENTING ILLNESS:   is a  62 y.o.  female presents for follow up of bilateral breast cancer, thrombocytopenia and leukopenia.  Oncology History  Malignant neoplasm of left breast (HCC)  08/06/2021 Mammogram   08/06/2021, digital bilateral mammogram and ultrasound showed left breast 3:00 mass, 1.7 x 1.7 x 1.7 cm, ultrasound of the left axillary is negative. 08/21/2021 right breast 1 x 0.7 x 0.8 cm angulated spiculated mass at the right breast 7:00, 5 cm from nipple.  Ultrasound of the right axillary demonstrates 3 abnormal thickened cortex lymph node. 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 Bary Richard right breast 7:00, invasive mammary carcinoma with lobular features, in situ carcinoma,  ER+, PR+, HER 2-This was found to be concordant by Dr. Bary Richard. right axilla lymph node +, extracapsular extension.  -This was found to be concordant by Dr. Bary Richard    08/28/2021 Initial Diagnosis   Malignant neoplasm of upper-outer quadrant of left breast in female, estrogen receptor positive (HCC)   08/28/2021 Imaging   CT CHEST, ABDOMEN, AND PELVIS WITH CONTRAST Asymmetric mildly enlarged right axillary nodes. Correlate with biopsy. There are some punctate foci of sclerosis in the included spine that could reflect subtle metastatic disease. Nonobstructing bilateral renal calculi.   08/29/2021 Imaging   BILATERAL BREAST MRI WITH AND WITHOUT CONTRAST 1. 2.5 centimeter enhancing mass in the LOWER OUTER QUADRANT of the RIGHT breast, correlating with known malignancy. 2. At least 4 enlarged RIGHT axillary lymph nodes, correlating well with recently biopsied lymph node showing metastatic disease. 3. 3.4 centimeter enhancing mass in the LEFT breast, with an additional 2.9 centimeters of non mass enhancement posterior to the mass also suspicious for malignancy. This likely correlates with the area calcifications seen mammographically. 4. 5 millimeters satellite nodule along the LATERAL aspect of known malignancy in the LOWER OUTER QUADRANT of the LEFT breast. 5. LEFT axilla is negative for adenopathy.       09/05/2021 Imaging   NUCLEAR MEDICINE WHOLE BODY BONE SCAN Uptake at L2 which is nonspecific but may be related to  advanced degenerative disc and facet disease changes at both L1-L2 and L2-L3; no evidence of osseous metastatic disease by CT. No definite osseous metastatic lesions identified.    Genetic Testing   Negative genetic testing. No pathogenic variants identified on the [facility] CancerNext-Expanded+RNA panel. The report date is 10/03/2021.  The CancerNext-Expanded + RNAinsight gene panel offered by [employer] and includes sequencing and rearrangement analysis for the following 77 genes: IP, ALK, APC*, ATM*, AXIN2, BAP1, BARD1, BLM, BMPR1A, BRCA1*, BRCA2*, BRIP1*, CDC73, CDH1*,CDK4, CDKN1B, CDKN2A, CHEK2*, CTNNA1, DICER1, FANCC, FH, FLCN, GALNT12, KIF1B,  LZTR1, MAX, MEN1, MET, MLH1*, MSH2*, MSH3, MSH6*, MUTYH*, NBN, NF1*, NF2, NTHL1, PALB2*, PHOX2B, PMS2*, POT1, PRKAR1A, PTCH1, PTEN*, RAD51C*, RAD51D*,RB1, RECQL, RET, SDHA, SDHAF2, SDHB, SDHC, SDHD, SMAD4, SMARCA4, SMARCB1, SMARCE1, STK11, SUFU, TMEM127, TP53*,TSC1, TSC2, VHL and XRCC2 (sequencing and deletion/duplication); EGFR, EGLN1, HOXB13, KIT, MITF, PDGFRA, POLD1 and POLE (sequencing only); EPCAM and GREM1 (deletion/duplication only).   11/15/2021 Surgery   She underwent left simple mastectomy and right modified mastectomy.   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  pT2 pN1a, ER +90%, PR +90%, HER2 negative (1+)  # Right Breast invasive lobular carcinoma Grade 2, negative margins, right axillary lymph nodes  30/32 involved with metastatic carcinoma, pT1c pN3a ER +90%, PR +51-90%, HER2 negative (1+)    11/25/2021 Cancer Staging   Staging form: Breast, AJCC 8th Edition - Pathologic stage from 11/25/2021: Stage IB (pT2, pN1, cM0, G2, ER+, PR+, HER2-) - Signed by Rickard Patience, MD on 11/25/2021 Stage prefix: Initial diagnosis Multigene prognostic tests performed: None Histologic grading system: 3 grade system   01/09/2022 - 03/10/2022 Radiation Therapy   Adjuvant breast Radiation.    07/25/2022 Cancer Staging   Staging form: Breast, AJCC 8th Edition - Pathologic stage from 07/25/2022: Stage IV (rpTX, pNX, pM1, ER+, PR: Not Assessed, HER2-) - Signed by Rickard Patience, MD on 08/14/2022 Stage prefix: Recurrence Multigene prognostic tests performed: None   07/25/2022 Procedure   Colonoscopy showed Findings - Six 5 to 6 mm polyps in the ascending colon, removed with a cold snare. Resected and retrieved. - One 10 mm polyp in the descending colon, removed with a cold snare. Resected and  retrieved. Clip was placed. - One 30 mm polyp in the rectum, removed with a hot snare. Resected and retrieved. Clips were placed.  Pathology showed A.  COLON POLYP X 6, ASCENDING AND CECUM; COLD SNARE: - COLONIC MUCOSA WITH METASTATIC LOBULAR BREAST CARCINOMA (3) - HYPERPLASTIC POLYP (3)  B.  COLON POLYP, DESCENDING; COLD SNARE: - TUBULAR ADENOMA. - NEGATIVE FOR HIGH-GRADE DYSPLASIA AND MALIGNANCY.  C.  RECTUM POLYP; HOT SNARE: - TUBULOVILLOUS ADENOMA. - NEGATIVE FOR HIGH-GRADE DYSPLASIA AND MALIGNANCY.  Comment: The metastatic lobular breast carcinoma is positive for GATA-3 and ER (strong staining in greater than 90% of the tumor cells). HER2 negative (1+)     Malignant neoplasm of lower-outer quadrant of right breast of female, estrogen receptor positive (HCC)  08/06/2021 Mammogram   08/06/2021, digital bilateral mammogram and ultrasound showed left breast 3:00 mass, 1.7 x 1.7 x 1.7 cm, ultrasound of the left axillary is negative. 08/21/2021 right breast 1 x 0.7 x 0.8 cm angulated spiculated mass at the right breast 7:00, 5 cm from nipple.  Ultrasound of the right axillary demonstrates 3 abnormal thickened cortex lymph node. Patient was recommended to proceed with biopsy left  breast 3:00 invasive mammry carcinoma with lobular features. in situ carcinoma, lobular neoplasia. 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 Bary Richard right breast 7:00, invasive mammary carcinoma with lobular features, in situ carcinoma,  ER+, PR+, HER 2-This was found to be concordant by Dr. Bary Richard. right axilla lymph node +, extracapsular extension.  -This was found to be concordant by Dr. Bary Richard   08/28/2021 Initial Diagnosis   Malignant neoplasm of lower-outer quadrant of right breast of female, estrogen receptor positive (HCC)   08/28/2021 Imaging   CT CHEST, ABDOMEN, AND PELVIS WITH CONTRAST Asymmetric mildly enlarged right axillary nodes. Correlate with biopsy. There are  some punctate foci of sclerosis in the included spine that could reflect subtle metastatic disease. Nonobstructing bilateral renal calculi.   08/29/2021 Imaging   BILATERAL BREAST MRI WITH AND WITHOUT CONTRAST 1. 2.5 centimeter enhancing mass in the LOWER OUTER QUADRANT of the RIGHT breast, correlating with known malignancy. 2. At least 4 enlarged RIGHT axillary lymph nodes, correlating well with recently biopsied lymph node showing metastatic disease. 3. 3.4 centimeter enhancing mass in the LEFT breast, with an additional 2.9 centimeters of non mass enhancement posterior to the mass also suspicious for malignancy. This likely correlates with the area calcifications seen mammographically. 4. 5 millimeters satellite nodule along the LATERAL aspect of known malignancy in the LOWER OUTER QUADRANT of the LEFT breast. 5. LEFT axilla is negative for adenopathy.       09/05/2021 Imaging   NUCLEAR MEDICINE WHOLE BODY BONE SCAN Uptake at L2 which is nonspecific but may be related to advanced degenerative disc and facet disease changes at both L1-L2 and L2-L3; no evidence of osseous metastatic disease by CT. No definite osseous metastatic lesions identified.   09/09/2021 Oncotype testing   [id number] block RIGHT 7:00 5 CM FN; ULTRASOUND-GUIDED BIOPSY Oncotype Dx RS score 22    Genetic Testing   Negative genetic testing. No pathogenic variants identified on the [facility] CancerNext-Expanded+RNA panel. The report date is 10/03/2021.  The CancerNext-Expanded + RNAinsight gene panel offered by [employer] and includes sequencing and rearrangement analysis for the following 77 genes: IP, ALK, APC*, ATM*, AXIN2, BAP1, BARD1, BLM, BMPR1A, BRCA1*, BRCA2*, BRIP1*, CDC73, CDH1*,CDK4, CDKN1B, CDKN2A, CHEK2*, CTNNA1, DICER1, FANCC, FH, FLCN, GALNT12, KIF1B, LZTR1, MAX, MEN1, MET, MLH1*, MSH2*, MSH3, MSH6*, MUTYH*, NBN, NF1*, NF2, NTHL1, PALB2*, PHOX2B, PMS2*, POT1, PRKAR1A, PTCH1, PTEN*, RAD51C*, RAD51D*,RB1,  RECQL, RET, SDHA, SDHAF2, SDHB, SDHC, SDHD, SMAD4, SMARCA4, SMARCB1, SMARCE1, STK11, SUFU, TMEM127, TP53*,TSC1, TSC2, VHL and XRCC2 (sequencing and deletion/duplication); EGFR, EGLN1, HOXB13, KIT, MITF, PDGFRA, POLD1 and POLE (sequencing only); EPCAM and GREM1 (deletion/duplication only).   11/15/2021 Surgery   She underwent left simple mastectomy and right modified mastectomy.   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  pT2 pN1a, ER +90%, PR +90%, HER2 negative (1+)  # Right Breast invasive lobular carcinoma Grade 2, negative margins, right axillary lymph nodes  30/32 involved with metastatic carcinoma, pT1c pN3a ER +90%, PR +51-90%, HER2 negative (1+)    11/25/2021 Cancer Staging   Staging form: Breast, AJCC 8th Edition - Pathologic stage from 11/25/2021: Stage IIIA (pT1c, pN3a, cM0, G2, ER+, PR+, HER2-) - Signed by Rickard Patience, MD on 11/25/2021 Stage prefix: Initial diagnosis Multigene prognostic tests performed: None Histologic grading system: 3 grade system   12/10/2021  Imaging   PET scan  1. Interval bilateral mastectomy and right axillary node dissection.No evidence of residual disease in the chest wall, nodal metastases or distant metastases. 2. Focal hypermetabolic activity in the proximal rectum corresponding with an intraluminal polypoid lesion, suspicious for a villous adenoma or early colon cancer. Sigmoidoscopy/colonoscopy recommended unless recently performed   12/10/2021 Imaging   1. Interval bilateral mastectomy and right axillary node dissection.No evidence of residual disease in the chest wall, nodal metastasesor distant metastases. 2. Focal hypermetabolic activity in the proximal rectum corresponding with an intraluminal polypoid lesion, suspicious for a villous  adenoma or early colon cancer. Sigmoidoscopy/colonoscopy recommended unless recently performed    She is a poor historian. History of major depression, psychosis, previously on olanzapine and Remeron not currently on any and if not currently following up with psychiatrist. Patient's family history is positive for sister with breast cancer   INTERVAL HISTORY DRU LAUREL is a 62 y.o. female who has above history reviewed by me today presents for follow up visit for management of bilateral breast cancer She has been on Lexapro since Oct 2023.  Take Arimidex 1mg  daily since Dec 2023, she reports some muscle/joint pain but overall manageable.  She tolerates Fulvestrant + Patient reports feeling a knot under her left axilla + Patient reports bed bug bites.   Review of Systems  Constitutional:  Negative for appetite change, chills, fatigue and fever.  HENT:   Negative for hearing loss and voice change.   Eyes:  Negative for eye problems.  Respiratory:  Negative for chest tightness and cough.   Cardiovascular:  Negative for chest pain.  Gastrointestinal:  Negative for abdominal distention, abdominal pain and blood in stool.  Endocrine: Negative for hot flashes.  Genitourinary:  Negative for difficulty urinating and frequency.   Musculoskeletal:  Negative for arthralgias.  Skin:  Positive for itching. Negative for rash.  Neurological:  Negative for extremity weakness.  Hematological:  Negative for adenopathy.  Psychiatric/Behavioral:  Negative for confusion.     MEDICAL HISTORY:  Past Medical History:  Diagnosis Date   Anxiety    Cancer (HCC)    Depression    Diabetes mellitus without complication (HCC)    History of high cholesterol 2000   Hyperlipidemia    Hypertension    Patient denies medical problems    Thrombocytopenia (HCC)     SURGICAL HISTORY: Past Surgical History:  Procedure Laterality Date   ABDOMINAL HYSTERECTOMY     AXILLARY SENTINEL NODE BIOPSY Left  11/15/2021   Procedure: AXILLARY SENTINEL NODE BIOPSY;  Surgeon: Campbell Lerner, MD;  Location: [facility] ORS;  Service: General;  Laterality: Left;   BREAST BIOPSY Right 08/21/2021   [place] bx 7:00 mass coil clip path pending   BREAST BIOPSY Right 08/21/2021   [place] bx of LN, hydro marker, path pending   BREAST BIOPSY Left 08/21/2021   [place] bx, heart marker, path pending   CESAREAN SECTION     x2   COLONOSCOPY WITH PROPOFOL N/A 07/25/2022   Procedure: COLONOSCOPY WITH PROPOFOL;  Surgeon: Wyline Mood, MD;  Location: [facility] ENDOSCOPY;  Service: Gastroenterology;  Laterality: N/A;   MASTECTOMY     MASTECTOMY MODIFIED RADICAL Right 11/15/2021   Procedure: MASTECTOMY MODIFIED RADICAL;  Surgeon: Campbell Lerner, MD;  Location: [facility] ORS;  Service: General;  Laterality: Right;   NO PAST SURGERIES     TOTAL MASTECTOMY Left 11/15/2021   Procedure: TOTAL MASTECTOMY;  Surgeon: Campbell Lerner, MD;  Location: [facility] ORS;  Service: General;  Laterality:  Left;    SOCIAL HISTORY: Social History   Socioeconomic History   Marital status: Married    Spouse name: Not on file   Number of children: Not on file   Years of education: Not on file   Highest education level: Not on file  Occupational History   Not on file  Tobacco Use   Smoking status: Former    Current packs/day: 0.00    Types: Cigarettes    Quit date: 2013    Years since quitting: 11.7    Passive exposure: Current   Smokeless tobacco: Never  Vaping Use   Vaping status: Never Used  Substance and Sexual Activity   Alcohol use: Not Currently    Comment: occasionally   Drug use: No   Sexual activity: Not Currently    Comment: unable to assess   Other Topics Concern   Not on file  Social History Narrative   Not on file   Social Determinants of Health   Financial Resource Strain: Medium Risk (10/10/2021)   Overall Financial Resource Strain (CARDIA)    Difficulty of Paying Living Expenses: Somewhat hard  Food Insecurity: No Food Insecurity  (10/10/2021)   Hunger Vital Sign    Worried About Running Out of Food in the Last Year: Never true    Ran Out of Food in the Last Year: Never true  Transportation Needs: Unmet Transportation Needs (01/21/2022)   PRAPARE - Transportation    Lack of Transportation (Medical): Yes    Lack of Transportation (Non-Medical): Yes  Physical Activity: Inactive (10/10/2021)   Exercise Vital Sign    Days of Exercise per Week: 0 days    Minutes of Exercise per Session: 0 min  Stress: Stress Concern Present (10/10/2021)   [employer] of Occupational Health - Occupational Stress Questionnaire    Feeling of Stress : To some extent  Social Connections: Socially Isolated (10/10/2021)   Social Connection and Isolation Panel [NHANES]    Frequency of Communication with Friends and Family: Once a week    Frequency of Social Gatherings with Friends and Family: Never    Attends Religious Services: Never    Diplomatic Services operational officer: No    Attends Engineer, structural: Never    Marital Status: Married  Catering manager Violence: Not on file    FAMILY HISTORY: Family History  Problem Relation Age of Onset   Lung cancer Mother    Dementia Mother    Parkinson's disease Father    Cancer Father        unk type   Diabetes Sister    Heart attack Sister    Breast cancer Sister    Cancer Maternal Uncle        unk types   Dementia Maternal Grandmother    Cancer Maternal Grandmother        unk type   Cancer Other    Dementia Other     ALLERGIES:  is allergic to tylenol [acetaminophen] and aleve [naproxen].  MEDICATIONS:  Current Outpatient Medications  Medication Sig Dispense Refill   ACCU-CHEK GUIDE test strip USE TO CHECK BLOOD SUGAR UP TO 4 TIMES DAILY AS DIRECTED 100 each 0   Accu-Chek Softclix Lancets lancets USE TO CHECK BLOOD SUAGR UP TO 4 TIMES DAILY AS DIRECTED 100 each 0   anastrozole (ARIMIDEX) 1 MG tablet Take 1 tablet (1 mg total) by mouth daily. 90 tablet 1    blood glucose meter kit and supplies KIT Dispense based on patient  and insurance preference. Use up to four times daily as directed. 1 each 1   calcium carbonate (OS-CAL) 600 MG TABS tablet Take 2 tablets (1,200 mg total) by mouth daily. 60 tablet 5   cyanocobalamin (VITAMIN B12) 1000 MCG tablet Take 1 tablet (1,000 mcg total) by mouth daily. 30 tablet 2   escitalopram (LEXAPRO) 10 MG tablet Take 1 tablet (10 mg total) by mouth daily. 90 tablet 1   hydrOXYzine (ATARAX) 10 MG tablet Take 1 tablet (10 mg total) by mouth 2 (two) times daily as needed for itching or anxiety. 90 tablet 1   metFORMIN (GLUCOPHAGE) 500 MG tablet Take 1 tablet (500 mg total) by mouth daily. 90 tablet 1   potassium chloride SA (KLOR-CON M) 20 MEQ tablet Take 1 tablet (20 mEq total) by mouth daily. 3 tablet 0   rosuvastatin (CRESTOR) 5 MG tablet Take 1 tablet (5 mg total) by mouth daily. 90 tablet 1   triamcinolone ointment (KENALOG) 0.5 % Apply 1 Application topically 2 (two) times daily. 30 g 1   No current facility-administered medications for this visit.     PHYSICAL EXAMINATION: ECOG PERFORMANCE STATUS: 1 - Symptomatic but completely ambulatory There were no vitals filed for this visit.  There were no vitals filed for this visit.   Physical Exam Constitutional:      General: She is not in acute distress. HENT:     Head: Normocephalic and atraumatic.  Eyes:     General: No scleral icterus. Cardiovascular:     Rate and Rhythm: Normal rate.  Pulmonary:     Effort: Pulmonary effort is normal. No respiratory distress.     Breath sounds: No wheezing.  Abdominal:     General: There is no distension.  Musculoskeletal:        General: No deformity. Normal range of motion.     Cervical back: Normal range of motion and neck supple.  Skin:    Coloration: Skin is not jaundiced.  Neurological:     Mental Status: She is alert and oriented to person, place, and time. Mental status is at baseline.   Due to  possible bedbug infestation, patient was seen by me in our conference room with no examination table. I am not able to appreciate the left axilla mass well  LABORATORY DATA:  I have reviewed the data as listed    Latest Ref Rng & Units 01/14/2023    2:20 PM 10/14/2022    1:30 PM 09/16/2022    1:19 PM  CBC  WBC 4.0 - 10.5 K/uL 3.7  4.4  2.8   Hemoglobin 12.0 - 15.0 g/dL 84.6  96.2  95.2   Hematocrit 36.0 - 46.0 % 36.5  37.8  37.0   Platelets 150 - 400 K/uL 66  67  53       Latest Ref Rng & Units 01/14/2023    2:20 PM 10/14/2022    1:31 PM 09/16/2022    1:19 PM  CMP  Glucose 70 - 99 mg/dL 841  324  401   BUN 8 - 23 mg/dL 13  15  18    Creatinine 0.44 - 1.00 mg/dL 0.27  2.53  6.64   Sodium 135 - 145 mmol/L 140  137  139   Potassium 3.5 - 5.1 mmol/L 3.4  3.7  3.3   Chloride 98 - 111 mmol/L 107  104  105   CO2 22 - 32 mmol/L 24  23  26    Calcium 8.9 - 10.3  mg/dL 9.3  9.2  9.1   Total Protein 6.5 - 8.1 g/dL 7.3  7.5  7.1   Total Bilirubin 0.3 - 1.2 mg/dL 0.6  0.8  0.7   Alkaline Phos 38 - 126 U/L 56  63  61   AST 15 - 41 U/L 25  26  22    ALT 0 - 44 U/L 15  16  16      RADIOGRAPHIC STUDIES: I have personally reviewed the radiological images as listed and agreed with the findings in the report. No results found.

## 2023-01-14 NOTE — Assessment & Plan Note (Addendum)
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  Drug induced leukopenia vs other etiologies.  Continue observation.

## 2023-01-15 ENCOUNTER — Inpatient Hospital Stay: Payer: MEDICAID

## 2023-01-15 LAB — CANCER ANTIGEN 27.29: CA 27.29: 28.6 U/mL (ref 0.0–38.6)

## 2023-01-15 LAB — CANCER ANTIGEN 15-3: CA 15-3: 24.3 U/mL (ref 0.0–25.0)

## 2023-01-15 NOTE — Progress Notes (Signed)
[facility] CSW Progress Note  Clinical Child psychotherapist contacted patient by phone per the request of the medical provider.  Patient stated she has bed bugs and is hesitant to contact her landlord to have their apartment fumigated.  Patient reports having a large amount of items that would need to be removed from the apartment.  Informed patient of the process in which the [facility] could assist her.  She requested CSW speak with her husband tomorrow around 11am.  Will follow up.  Also discussed with Abelardo Diesel, Manager.  Dorothey Baseman, LCSW Clinical Social Worker [facility]

## 2023-01-16 ENCOUNTER — Inpatient Hospital Stay: Payer: MEDICAID

## 2023-01-16 NOTE — Progress Notes (Signed)
[facility] CSW Progress Note  Clinical Child psychotherapist contacted patient's husband, Alvino Chapel, per her request.  Further discussed their bed bug issue.  Alvino Chapel declined assistance from [facility].  He stated he will address the issue himself.  Provided CSW contact for any needs in the future.    Dorothey Baseman, LCSW Clinical Social Worker [facility]

## 2023-01-27 ENCOUNTER — Ambulatory Visit
Admission: RE | Admit: 2023-01-27 | Discharge: 2023-01-27 | Disposition: A | Discharge status: Home / Self Care | Payer: MEDICAID | Source: Ambulatory Visit | Attending: Oncology | Admitting: Oncology

## 2023-01-27 ENCOUNTER — Other Ambulatory Visit: Payer: Self-pay | Admitting: Oncology

## 2023-01-27 DIAGNOSIS — Z170 Estrogen receptor positive status [ER+]: Secondary | ICD-10-CM | POA: Insufficient documentation

## 2023-01-27 DIAGNOSIS — C50412 Malignant neoplasm of upper-outer quadrant of left female breast: Secondary | ICD-10-CM | POA: Insufficient documentation

## 2023-02-16 ENCOUNTER — Inpatient Hospital Stay: Payer: MEDICAID | Attending: Radiation Oncology

## 2023-02-16 DIAGNOSIS — Z5111 Encounter for antineoplastic chemotherapy: Secondary | ICD-10-CM | POA: Diagnosis present

## 2023-02-16 DIAGNOSIS — Z170 Estrogen receptor positive status [ER+]: Secondary | ICD-10-CM

## 2023-02-16 DIAGNOSIS — C50511 Malignant neoplasm of lower-outer quadrant of right female breast: Secondary | ICD-10-CM | POA: Diagnosis present

## 2023-02-16 DIAGNOSIS — C50812 Malignant neoplasm of overlapping sites of left female breast: Secondary | ICD-10-CM | POA: Diagnosis present

## 2023-02-16 DIAGNOSIS — Z79899 Other long term (current) drug therapy: Secondary | ICD-10-CM | POA: Diagnosis not present

## 2023-02-16 MED ORDER — FULVESTRANT 250 MG/5ML IM SOSY
500.0000 mg | PREFILLED_SYRINGE | Freq: Once | INTRAMUSCULAR | Status: AC
  Administered 2023-02-16: 500 mg via INTRAMUSCULAR
  Filled 2023-02-16: qty 10

## 2023-03-18 ENCOUNTER — Inpatient Hospital Stay: Payer: MEDICAID | Attending: Radiation Oncology

## 2023-03-18 DIAGNOSIS — Z170 Estrogen receptor positive status [ER+]: Secondary | ICD-10-CM

## 2023-03-18 MED ORDER — FULVESTRANT 250 MG/5ML IM SOSY
500.0000 mg | PREFILLED_SYRINGE | Freq: Once | INTRAMUSCULAR | Status: DC
  Filled 2023-03-18: qty 10

## 2023-03-24 ENCOUNTER — Other Ambulatory Visit: Payer: Self-pay | Admitting: Oncology

## 2023-04-16 ENCOUNTER — Ambulatory Visit: Admission: RE | Admit: 2023-04-16 | Payer: MEDICAID | Source: Ambulatory Visit

## 2023-04-17 ENCOUNTER — Other Ambulatory Visit: Payer: Self-pay | Admitting: Internal Medicine

## 2023-04-17 DIAGNOSIS — F419 Anxiety disorder, unspecified: Secondary | ICD-10-CM

## 2023-04-17 DIAGNOSIS — E1169 Type 2 diabetes mellitus with other specified complication: Secondary | ICD-10-CM

## 2023-04-17 DIAGNOSIS — E785 Hyperlipidemia, unspecified: Secondary | ICD-10-CM

## 2023-04-17 DIAGNOSIS — F331 Major depressive disorder, recurrent, moderate: Secondary | ICD-10-CM

## 2023-04-17 NOTE — Telephone Encounter (Signed)
Requested Prescriptions  Pending Prescriptions Disp Refills   rosuvastatin (CRESTOR) 5 MG tablet [Pharmacy Med Name: Rosuvastatin Calcium 5 MG Oral Tablet] 90 tablet 0    Sig: Take 1 tablet by mouth once daily     Cardiovascular:  Antilipid - Statins 2 Failed - 04/17/2023  2:59 PM      Failed - Lipid Panel in normal range within the last 12 months    Cholesterol  Date Value Ref Range Status  07/21/2022 230 (H) <200 mg/dL Final   LDL Cholesterol (Calc)  Date Value Ref Range Status  07/21/2022 134 (H) mg/dL (calc) Final    Comment:    Reference range: <100 . Desirable range <100 mg/dL for primary prevention;   <70 mg/dL for patients with CHD or diabetic patients  with > or = 2 CHD risk factors. Marland Kitchen LDL-C is now calculated using the Martin-Hopkins  calculation, which is a validated novel method providing  better accuracy than the Friedewald equation in the  estimation of LDL-C.  Horald Pollen et al. Lenox Ahr. 9604;540(98): 2061-2068  (http://education.[employer].com/faq/FAQ164)    HDL  Date Value Ref Range Status  07/21/2022 62 > OR = 50 mg/dL Final   Triglycerides  Date Value Ref Range Status  07/21/2022 202 (H) <150 mg/dL Final    Comment:    . If a non-fasting specimen was collected, consider repeat triglyceride testing on a fasting specimen if clinically indicated.  Perry Mount et al. J. of Clin. Lipidol. 2015;9:129-169. Marland Kitchen          Passed - Cr in normal range and within 360 days    Creatinine  Date Value Ref Range Status  01/14/2023 0.78 0.44 - 1.00 mg/dL Final   Creat  Date Value Ref Range Status  07/21/2022 0.70 0.50 - 1.05 mg/dL Final   Creatinine, Urine  Date Value Ref Range Status  01/20/2022 39 20 - 275 mg/dL Final         Passed - Patient is not pregnant      Passed - Valid encounter within last 12 months    Recent Outpatient Visits           5 months ago Type 2 diabetes mellitus with other specified complication, without long-term current use of  insulin [facility])   [place] [facility] Margarita Mail, DO   9 months ago Type 2 diabetes mellitus with other specified complication, without long-term current use of insulin [facility])   [facility] [facility] Margarita Mail, DO   1 year ago Anxiety   [facility] Margarita Mail, DO   1 year ago Type 2 diabetes mellitus with hyperglycemia, without long-term current use of insulin [facility])   [place] [facility] Margarita Mail, DO   1 year ago Type 2 diabetes mellitus with other specified complication, without long-term current use of insulin [facility])   [place] [facility] Margarita Mail, DO       Future Appointments             In 5 days Margarita Mail, DO  [facility]             hydrOXYzine (ATARAX) 10 MG tablet [Pharmacy Med Name: hydrOXYzine HCl 10 MG Oral Tablet] 180 tablet 0    Sig: TAKE 1 TABLET BY MOUTH TWICE DAILY AS NEEDED FOR ITCHING OR ANXIETY     Ear, Nose, and Throat:  Antihistamines 2 Passed - 04/17/2023  2:59 PM  Passed - Cr in normal range and within 360 days    Creatinine  Date Value Ref Range Status  01/14/2023 0.78 0.44 - 1.00 mg/dL Final   Creat  Date Value Ref Range Status  07/21/2022 0.70 0.50 - 1.05 mg/dL Final   Creatinine, Urine  Date Value Ref Range Status  01/20/2022 39 20 - 275 mg/dL Final         Passed - Valid encounter within last 12 months    Recent Outpatient Visits           5 months ago Type 2 diabetes mellitus with other specified complication, without long-term current use of insulin [facility])   [facility] Margarita Mail, DO   9 months ago Type 2 diabetes mellitus with other specified complication, without long-term current use of insulin [facility])   [place] [facility] Margarita Mail, DO   1 year ago Anxiety   [facility] Margarita Mail, DO   1 year ago Type 2 diabetes mellitus with hyperglycemia, without long-term current use of insulin [facility])   [place] [facility] Margarita Mail, DO   1 year ago Type 2 diabetes mellitus with other specified complication, without long-term current use of insulin [facility])   [place] [facility] Margarita Mail, DO       Future Appointments             In 5 days Margarita Mail, DO [place] [facility]             escitalopram (LEXAPRO) 10 MG tablet [Pharmacy Med Name: Escitalopram Oxalate 10 MG Oral Tablet] 90 tablet 0    Sig: Take 1 tablet by mouth once daily     Psychiatry:  Antidepressants - SSRI Passed - 04/17/2023  2:59 PM      Passed - Completed PHQ-2 or PHQ-9 in the last 360 days      Passed - Valid encounter within last 6 months    Recent Outpatient Visits           5 months ago Type 2 diabetes mellitus with other specified complication, without long-term current use of insulin [facility])   [facility] Margarita Mail, DO   9 months ago Type 2 diabetes mellitus with other specified complication, without long-term current use of insulin [facility])   [facility] Margarita Mail, DO   1 year ago Anxiety   [facility] Margarita Mail, DO   1 year ago Type 2 diabetes mellitus with hyperglycemia, without long-term current use of insulin [facility])   [place] [facility] Margarita Mail, DO   1 year ago Type 2 diabetes mellitus with other specified complication, without long-term current use of insulin [facility])   [place] [facility] Margarita Mail, DO       Future Appointments             In 5 days Margarita Mail, DO [place] [facility], [facility]             metFORMIN (GLUCOPHAGE) 500 MG tablet [Pharmacy Med Name: metFORMIN HCl 500 MG Oral  Tablet] 90 tablet 0    Sig: Take 1 tablet by mouth once daily     Endocrinology:  Diabetes - Biguanides Passed - 04/17/2023  2:59 PM      Passed - Cr in normal range and within 360 days    Creatinine  Date Value Ref Range Status  01/14/2023 0.78 0.44 - 1.00 mg/dL Final   Creat  Date Value Ref Range Status  07/21/2022 0.70 0.50 - 1.05 mg/dL Final   Creatinine, Urine  Date Value Ref Range Status  01/20/2022 39 20 - 275 mg/dL Final         Passed - HBA1C is between 0 and 7.9 and within 180 days    Hemoglobin A1C  Date Value Ref Range Status  10/20/2022 6.1 (A) 4.0 - 5.6 % Final   Hgb A1c MFr Bld  Date Value Ref Range Status  07/21/2022 6.2 (H) <5.7 % of total Hgb Final    Comment:    For someone without known diabetes, a hemoglobin  A1c value between 5.7% and 6.4% is consistent with prediabetes and should be confirmed with a  follow-up test. . For someone with known diabetes, a value <7% indicates that their diabetes is well controlled. A1c targets should be individualized based on duration of diabetes, age, comorbid conditions, and other considerations. . This assay result is consistent with an increased risk of diabetes. . Currently, no consensus exists regarding use of hemoglobin A1c for diagnosis of diabetes for children. .          Passed - eGFR in normal range and within 360 days    EGFR (African American)  Date Value Ref Range Status  10/07/2013 >60  Final   GFR calc Af Amer  Date Value Ref Range Status  11/15/2015 >60 >60 mL/min Final    Comment:    (NOTE) The eGFR has been calculated using the CKD EPI equation. This calculation has not been validated in all clinical situations. eGFR's persistently <60 mL/min signify possible Chronic Kidney Disease.    EGFR (Non-African Amer.)  Date Value Ref Range Status  10/07/2013 >60  Final    Comment:    eGFR values <40mL/min/1.73 m2 may be an indication of chronic kidney disease (CKD). Calculated eGFR is  useful in patients with stable renal function. The eGFR calculation will not be reliable in acutely ill patients when serum creatinine is changing rapidly. It is not useful in  patients on dialysis. The eGFR calculation may not be applicable to patients at the low and high extremes of body sizes, pregnant women, and vegetarians.    GFR, Estimated  Date Value Ref Range Status  01/14/2023 >60 >60 mL/min Final    Comment:    (NOTE) Calculated using the CKD-EPI Creatinine Equation (2021)          Passed - B12 Level in normal range and within 720 days    Vitamin B-12  Date Value Ref Range Status  08/14/2022 361 180 - 914 pg/mL Final    Comment:    (NOTE) This assay is not validated for testing neonatal or myeloproliferative syndrome specimens for Vitamin B12 levels. Performed at [facility], [address], [place], [place] [address]          Passed - Valid encounter within last 6 months    Recent Outpatient Visits           5 months ago Type 2 diabetes mellitus with other specified complication, without long-term current use of insulin [facility])   [place] [facility] Margarita Mail, DO   9 months ago Type 2 diabetes mellitus with other specified complication, without long-term current use of insulin [facility])   [place] [facility] Margarita Mail, DO   1 year ago Anxiety   [facility] Margarita Mail, DO   1 year ago Type 2 diabetes mellitus with hyperglycemia, without long-term current use of insulin [facility])   [place] Margarita Mail, DO   1 year ago Type 2 diabetes mellitus with other specified complication, without long-term current use of insulin [facility])   [place] [facility] Margarita Mail, DO       Future Appointments             In 5 days Margarita Mail, DO [facility]            Passed - CBC  within normal limits and completed in the last 12 months    WBC  Date Value Ref Range Status  07/07/2022 1.1 (LL) 4.0 - 10.5 K/uL Final    Comment:    REPEATED TO VERIFY THIS CRITICAL RESULT HAS VERIFIED AND BEEN CALLED TO VICKIE SCOTT RN BY ALEXIS THOMAS ON 04 08 2024 AT 0957, AND HAS BEEN READ BACK.     WBC Count  Date Value Ref Range Status  01/14/2023 3.7 (L) 4.0 - 10.5 K/uL Final   RBC  Date Value Ref Range Status  01/14/2023 4.16 3.87 - 5.11 MIL/uL Final   Hemoglobin  Date Value Ref Range Status  01/14/2023 12.2 12.0 - 15.0 g/dL Final  96/29/5284 13.2 11.1 - 15.9 g/dL Final   HCT  Date Value Ref Range Status  01/14/2023 36.5 36.0 - 46.0 % Final   Hematocrit  Date Value Ref Range Status  07/23/2022 34.9 34.0 - 46.6 % Final   MCHC  Date Value Ref Range Status  01/14/2023 33.4 30.0 - 36.0 g/dL Final   [facility]  Date Value Ref Range Status  01/14/2023 29.3 26.0 - 34.0 pg Final   MCV  Date Value Ref Range Status  01/14/2023 87.7 80.0 - 100.0 fL Final  07/23/2022 89 79 - 97 fL Final  10/07/2013 93 80 - 100 fL Final   No results found for: "PLTCOUNTKUC", "LABPLAT", "POCPLA" RDW  Date Value Ref Range Status  01/14/2023 14.3 11.5 - 15.5 % Final  07/23/2022 13.4 11.7 - 15.4 % Final  10/07/2013 13.2 11.5 - 14.5 % Final

## 2023-04-20 ENCOUNTER — Ambulatory Visit
Admission: RE | Admit: 2023-04-20 | Discharge: 2023-04-20 | Disposition: A | Discharge status: Home / Self Care | Payer: MEDICAID | Source: Ambulatory Visit | Attending: Oncology | Admitting: Oncology

## 2023-04-20 DIAGNOSIS — C50412 Malignant neoplasm of upper-outer quadrant of left female breast: Secondary | ICD-10-CM | POA: Insufficient documentation

## 2023-04-20 DIAGNOSIS — Z170 Estrogen receptor positive status [ER+]: Secondary | ICD-10-CM | POA: Diagnosis present

## 2023-04-20 MED ORDER — IOHEXOL 300 MG/ML  SOLN
100.0000 mL | Freq: Once | INTRAMUSCULAR | Status: AC | PRN
  Administered 2023-04-20: 100 mL via INTRAVENOUS

## 2023-04-22 ENCOUNTER — Telehealth: Payer: MEDICAID | Admitting: Internal Medicine

## 2023-04-23 ENCOUNTER — Other Ambulatory Visit: Payer: Self-pay

## 2023-04-23 ENCOUNTER — Ambulatory Visit (INDEPENDENT_AMBULATORY_CARE_PROVIDER_SITE_OTHER): Payer: MEDICAID | Admitting: Internal Medicine

## 2023-04-23 ENCOUNTER — Encounter: Payer: Self-pay | Admitting: Internal Medicine

## 2023-04-23 ENCOUNTER — Other Ambulatory Visit (HOSPITAL_COMMUNITY)
Admission: RE | Admit: 2023-04-23 | Discharge: 2023-04-23 | Disposition: A | Discharge status: Home / Self Care | Payer: MEDICAID | Source: Ambulatory Visit | Attending: Internal Medicine | Admitting: Internal Medicine

## 2023-04-23 VITALS — BP 120/74 | HR 68 | Temp 97.9°F | Resp 16 | Ht 61.0 in | Wt 175.5 lb

## 2023-04-23 DIAGNOSIS — F419 Anxiety disorder, unspecified: Secondary | ICD-10-CM | POA: Diagnosis not present

## 2023-04-23 DIAGNOSIS — E1169 Type 2 diabetes mellitus with other specified complication: Secondary | ICD-10-CM | POA: Diagnosis not present

## 2023-04-23 DIAGNOSIS — R82998 Other abnormal findings in urine: Secondary | ICD-10-CM

## 2023-04-23 DIAGNOSIS — C50912 Malignant neoplasm of unspecified site of left female breast: Secondary | ICD-10-CM

## 2023-04-23 DIAGNOSIS — R102 Pelvic and perineal pain: Secondary | ICD-10-CM

## 2023-04-23 DIAGNOSIS — N760 Acute vaginitis: Secondary | ICD-10-CM

## 2023-04-23 DIAGNOSIS — F331 Major depressive disorder, recurrent, moderate: Secondary | ICD-10-CM | POA: Insufficient documentation

## 2023-04-23 DIAGNOSIS — E782 Mixed hyperlipidemia: Secondary | ICD-10-CM

## 2023-04-23 DIAGNOSIS — B9689 Other specified bacterial agents as the cause of diseases classified elsewhere: Secondary | ICD-10-CM

## 2023-04-23 LAB — POCT URINALYSIS DIPSTICK
Bilirubin, UA: NEGATIVE
Blood, UA: NEGATIVE
Glucose, UA: NEGATIVE
Ketones, UA: NEGATIVE
Nitrite, UA: NEGATIVE
Protein, UA: POSITIVE — AB
Spec Grav, UA: 1.02 (ref 1.010–1.025)
Urobilinogen, UA: 0.2 U/dL
pH, UA: 5.5 (ref 5.0–8.0)

## 2023-04-23 MED ORDER — ESCITALOPRAM OXALATE 20 MG PO TABS: 20.0000 mg | ORAL_TABLET | Freq: Every day | ORAL | 1 refills | Status: DC

## 2023-04-23 NOTE — Assessment & Plan Note (Signed)
Exacerbated, increase Lexapro to 20 mg daily and continue hydroxyzine as needed.

## 2023-04-23 NOTE — Progress Notes (Signed)
Established Patient Office Visit  Subjective   Patient ID: Sharon Woods, female    DOB: 1960-07-25  Age: 63 y.o. MRN: [id number]  Chief Complaint  Patient presents with   Medical Management of Chronic Issues    6 month recheck    HPI  Sharon Woods presents to follow-up on chronic medical conditions. She is here with her husband..   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, CT scan yesterday now with new right chest wall soft tissue lesion which may represent locally recurrent or metastatic disease as well as new right seventh rib sclerotic osseous lesion -S/p Double mastectomy in August 2023 -Following with medical oncology, note reviewed from 01/14/23 -Continues to be on Arimidex 1 mg, now also on Fulvestrant injections -Family history of mother, father and sister with breast cancer -MRI brain negative for brains mets, but with small vessel ischemia   Diabetes, Type 2: -Last A1c 6.1% 7/24 -Medications: Metformin 500 mg -Patient is compliant with the above medications and reports no side effects.  -Eye exam: UTD -Foot exam: Due -Microalbumin: Due -Statin: Yes -PNA vaccine: Patient willing to have vaccine in the fall -Denies symptoms of hypoglycemia, polyuria, polydipsia, numbness extremities, foot ulcers/trauma.   HLD: -Medications: Crestor 5 mg  -Patient is compliant with medication reports no side effects -MRI of the brain showing small vessel ischemia 6/24 -Last lipid panel: Lipid Panel     Component Value Date/Time   CHOL 230 (H) 07/21/2022 1138   TRIG 202 (H) 07/21/2022 1138   HDL 62 07/21/2022 1138   CHOLHDL 3.7 07/21/2022 1138   VLDL 47 (H) 04/16/2015 0659   LDLCALC 134 (H) 07/21/2022 1138    MDD: -Mood status: worse -Current treatment: Lexapro 10 mg, Hydroxyzine 10 mg daily   -Satisfied with current treatment?: no -Symptom severity: moderate  -Duration of current treatment : months -Side effects: no Medication  compliance: excellent compliance -Previous psychiatric medications: prozac, seroquel, and zyprexa -patient states that she has been on medications for depression in the past, however her "husband did not want her to be on them" and she felt like a "zombie" so she discontinued them however she also states that she is under a lot of stress both at home and with her health Depressed mood: yes     04/23/2023    3:19 PM 10/20/2022   10:29 AM 07/21/2022   10:34 AM 05/09/2022   12:56 PM 04/15/2022   11:27 AM  Depression screen PHQ 2/9  Decreased Interest 0 0 0 0 2  Down, Depressed, Hopeless 0 0 0 0 1  PHQ - 2 Score 0 0 0 0 3  Altered sleeping  0 0  0  Tired, decreased energy  0 0  2  Change in appetite  0 0  0  Feeling bad or failure about yourself   0 0  0  Trouble concentrating  0 0  0  Moving slowly or fidgety/restless  0 0  0  Suicidal thoughts  0 0  0  PHQ-9 Score  0 0  5  Difficult doing work/chores  Not difficult at all Not difficult at all  Not difficult at all      Patient Active Problem List   Diagnosis Date Noted   Mixed hyperlipidemia 04/23/2023   Moderate episode of recurrent major depressive disorder (HCC) 04/23/2023   Abnormal CT scan 07/25/2022   Adenomatous polyp of colon 07/25/2022   Hypokalemia 06/24/2022   Anxiety 05/25/2022  Leukopenia 03/20/2022   Blood in stool 03/13/2022   Abnormal gastrointestinal PET scan 01/10/2022   Diabetes mellitus (HCC) 12/05/2021   Status post bilateral mastectomy 11/21/2021   Genetic testing 10/07/2021   Vaginitis 09/28/2021   Thrombocytopenia (HCC) 09/08/2021   Malignant neoplasm of left breast, stage 4 (HCC) 08/28/2021   Malignant neoplasm of lower-outer quadrant of right breast of female, estrogen receptor positive (HCC) 08/28/2021   Goals of care, counseling/discussion 08/28/2021   Severe recurrent major depression with psychotic features (HCC) 11/15/2015   Hypertension 11/15/2015   Noncompliance 11/15/2015   Severe major  depression, single episode, with psychotic features, mood-congruent (HCC) 03/29/2015   Protein-calorie malnutrition, severe 03/26/2015   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   Past Medical History:  Diagnosis Date   Anxiety    Cancer (HCC)    Depression    Diabetes mellitus without complication (HCC)    History of high cholesterol 2000   Hyperlipidemia    Hypertension    Patient denies medical problems    Thrombocytopenia (HCC)    Past Surgical History:  Procedure Laterality Date   ABDOMINAL HYSTERECTOMY     AXILLARY SENTINEL NODE BIOPSY Left 11/15/2021   Procedure: AXILLARY SENTINEL NODE BIOPSY;  Surgeon: Campbell Lerner, MD;  Location: [facility] ORS;  Service: General;  Laterality: Left;   BREAST BIOPSY Right 08/21/2021   [place] bx 7:00 mass coil clip path pending   BREAST BIOPSY Right 08/21/2021   [place] bx of LN, hydro marker, path pending   BREAST BIOPSY Left 08/21/2021   [place] bx, heart marker, path pending   CESAREAN SECTION     x2   COLONOSCOPY WITH PROPOFOL N/A 07/25/2022   Procedure: COLONOSCOPY WITH PROPOFOL;  Surgeon: Wyline Mood, MD;  Location: [facility] ENDOSCOPY;  Service: Gastroenterology;  Laterality: N/A;   MASTECTOMY     MASTECTOMY MODIFIED RADICAL Right 11/15/2021   Procedure: MASTECTOMY MODIFIED RADICAL;  Surgeon: Campbell Lerner, MD;  Location: [facility] ORS;  Service: General;  Laterality: Right;   NO PAST SURGERIES     TOTAL MASTECTOMY Left 11/15/2021   Procedure: TOTAL MASTECTOMY;  Surgeon: Campbell Lerner, MD;  Location: [facility] ORS;  Service: General;  Laterality: Left;   Social History   Tobacco Use   Smoking status: Former    Current packs/day: 0.00    Types: Cigarettes    Quit date: 2013    Years since quitting: 12.0    Passive exposure: Current   Smokeless tobacco: Never  Vaping Use   Vaping status: Never Used  Substance Use Topics   Alcohol use: Not Currently    Comment: occasionally   Drug use: No   Social History   Socioeconomic History   Marital status: Married     Spouse name: Not on file   Number of children: Not on file   Years of education: Not on file   Highest education level: Not on file  Occupational History   Not on file  Tobacco Use   Smoking status: Former    Current packs/day: 0.00    Types: Cigarettes    Quit date: 2013    Years since quitting: 12.0    Passive exposure: Current   Smokeless tobacco: Never  Vaping Use   Vaping status: Never Used  Substance and Sexual Activity   Alcohol use: Not Currently    Comment: occasionally   Drug use: No   Sexual activity: Not Currently    Comment: unable to assess   Other Topics Concern   Not on file  Social History  Narrative   Not on file   Social Drivers of Health   Financial Resource Strain: Medium Risk (10/10/2021)   Overall Financial Resource Strain (CARDIA)    Difficulty of Paying Living Expenses: Somewhat hard  Food Insecurity: No Food Insecurity (10/10/2021)   Hunger Vital Sign    Worried About Running Out of Food in the Last Year: Never true    Ran Out of Food in the Last Year: Never true  Transportation Needs: Unmet Transportation Needs (01/21/2022)   PRAPARE - Administrator, Civil Service (Medical): Yes    Lack of Transportation (Non-Medical): Yes  Physical Activity: Inactive (10/10/2021)   Exercise Vital Sign    Days of Exercise per Week: 0 days    Minutes of Exercise per Session: 0 min  Stress: Stress Concern Present (10/10/2021)   [employer] of Occupational Health - Occupational Stress Questionnaire    Feeling of Stress : To some extent  Social Connections: Socially Isolated (10/10/2021)   Social Connection and Isolation Panel [NHANES]    Frequency of Communication with Friends and Family: Once a week    Frequency of Social Gatherings with Friends and Family: Never    Attends Religious Services: Never    Diplomatic Services operational officer: No    Attends Engineer, structural: Never    Marital Status: Married  Catering manager  Violence: Not on file   Family Status  Relation Name Status   Mother  Deceased   Father  Deceased   Sister  Deceased   Mat Uncle x2 Deceased   MGM  Deceased   Other  (Not Specified)   Other  (Not Specified)  No partnership data on file   Family History  Problem Relation Age of Onset   Lung cancer Mother    Dementia Mother    Parkinson's disease Father    Cancer Father        unk type   Diabetes Sister    Heart attack Sister    Breast cancer Sister    Cancer Maternal Uncle        unk types   Dementia Maternal Grandmother    Cancer Maternal Grandmother        unk type   Cancer Other    Dementia Other    Allergies  Allergen Reactions   Tylenol [Acetaminophen]     Per pt due to cirrhosis of the liver   Aleve [Naproxen] Rash      Review of Systems  All other systems reviewed and are negative.     Objective:     BP 120/74 (Cuff Size: Large)   Pulse 68   Temp 97.9 F (36.6 C) (Oral)   Resp 16   Ht 5\' 1"  (1.549 m)   Wt 175 lb 8 oz (79.6 kg)   LMP  (LMP Unknown) Comment: Patient is not a good historian  SpO2 98%   BMI 33.16 kg/m  BP Readings from Last 3 Encounters:  04/23/23 120/74  10/28/22 (!) 140/87  10/20/22 (!) 146/93   Wt Readings from Last 3 Encounters:  04/23/23 175 lb 8 oz (79.6 kg)  10/28/22 171 lb (77.6 kg)  10/20/22 169 lb 11.2 oz (77 kg)      Physical Exam Constitutional:      Appearance: Normal appearance.  HENT:     Head: Normocephalic and atraumatic.  Eyes:     Conjunctiva/sclera: Conjunctivae normal.  Cardiovascular:     Rate and Rhythm: Normal rate and regular  rhythm.     Pulses:          Dorsalis pedis pulses are 2+ on the right side and 2+ on the left side.  Pulmonary:     Effort: Pulmonary effort is normal.     Breath sounds: Normal breath sounds.  Musculoskeletal:     Right foot: Normal range of motion. No deformity, bunion, Charcot foot, foot drop or prominent metatarsal heads.     Left foot: Normal range of motion.  No deformity, bunion, Charcot foot, foot drop or prominent metatarsal heads.  Feet:     Right foot:     Protective Sensation: 6 sites tested.  6 sites sensed.     Skin integrity: Dry skin present.     Toenail Condition: Right toenails are normal.     Left foot:     Protective Sensation: 6 sites tested.  6 sites sensed.     Skin integrity: Dry skin present.     Toenail Condition: Left toenails are normal.  Skin:    General: Skin is warm and dry.  Neurological:     General: No focal deficit present.     Mental Status: She is alert. Mental status is at baseline.  Psychiatric:        Mood and Affect: Mood normal.        Behavior: Behavior normal.      Results for orders placed or performed in visit on 04/23/23  POCT Urinalysis Dipstick  Result Value Ref Range   Color, UA gold    Clarity, UA cloudy    Glucose, UA Negative Negative   Bilirubin, UA negative    Ketones, UA negative    Spec Grav, UA 1.020 1.010 - 1.025   Blood, UA negative    pH, UA 5.5 5.0 - 8.0   Protein, UA Positive (A) Negative   Urobilinogen, UA 0.2 0.2 or 1.0 E.U./dL   Nitrite, UA negative    Leukocytes, UA Moderate (2+) (A) Negative   Appearance clody    Odor none     Last CBC Lab Results  Component Value Date   WBC 3.7 (L) 01/14/2023   HGB 12.2 01/14/2023   HCT 36.5 01/14/2023   MCV 87.7 01/14/2023   MCH 29.3 01/14/2023   RDW 14.3 01/14/2023   PLT 66 (L) 01/14/2023   Last metabolic panel Lab Results  Component Value Date   GLUCOSE 176 (H) 01/14/2023   NA 140 01/14/2023   K 3.4 (L) 01/14/2023   CL 107 01/14/2023   CO2 24 01/14/2023   BUN 13 01/14/2023   CREATININE 0.78 01/14/2023   GFRNONAA >60 01/14/2023   CALCIUM 9.3 01/14/2023   PHOS 2.1 (L) 03/26/2015   PROT 7.3 01/14/2023   ALBUMIN 3.9 01/14/2023   BILITOT 0.6 01/14/2023   ALKPHOS 56 01/14/2023   AST 25 01/14/2023   ALT 15 01/14/2023   ANIONGAP 9 01/14/2023   Last lipids Lab Results  Component Value Date   CHOL 230 (H)  07/21/2022   HDL 62 07/21/2022   LDLCALC 134 (H) 07/21/2022   TRIG 202 (H) 07/21/2022   CHOLHDL 3.7 07/21/2022   Last hemoglobin A1c Lab Results  Component Value Date   HGBA1C 6.1 (A) 10/20/2022   Last thyroid functions Lab Results  Component Value Date   TSH 0.767 03/25/2015   Last vitamin D No results found for: "25OHVITD2", "25OHVITD3", "VD25OH" Last vitamin B12 and Folate Lab Results  Component Value Date   VITAMINB12 361 08/14/2022   FOLATE  22.0 05/05/2022      The 10-year ASCVD risk score (Arnett DK, et al., 2019) is: 6.3%    Assessment & Plan:  Type 2 diabetes mellitus with other specified complication, without long-term current use of insulin (HCC) Assessment & Plan: Recheck A1c, microalbumin today.  Foot exam today as well.  Continue metformin 500 mg daily  Orders: -     CBC with Differential/Platelet -     COMPLETE METABOLIC PANEL WITH GFR -     Hemoglobin A1c -     Microalbumin / creatinine urine ratio -     HM Diabetes Foot Exam  Malignant neoplasm of left breast, stage 4 (HCC) Assessment & Plan: Following with oncology, note reviewed from 01/14/2023.  Reviewed CT scan from yesterday which potentially shows further metastatic disease.    Mixed hyperlipidemia Assessment & Plan: Recheck labs today now that she is on a statin.   Orders: -     Lipid panel  Anxiety Assessment & Plan: Exacerbated, increase Lexapro to 20 mg daily and continue hydroxyzine as needed.  Orders: -     Escitalopram Oxalate; Take 1 tablet (20 mg total) by mouth daily.  Dispense: 90 tablet; Refill: 1  Moderate episode of recurrent major depressive disorder (HCC) Assessment & Plan: Exacerbated, increase Lexapro to 20 mg daily and continue hydroxyzine as needed.  Orders: -     Escitalopram Oxalate; Take 1 tablet (20 mg total) by mouth daily.  Dispense: 90 tablet; Refill: 1  Vaginal pain -     POCT urinalysis dipstick -     Cervicovaginal ancillary only  Leukocytes in  urine -     Urine Culture  Patient complaining of vaginal pain, UA showing leukocytes but negative for nitrates, will send for urine culture.  Vaginal swab ordered today as well.  Return in about 3 months (around 07/22/2023).    Margarita Mail, DO

## 2023-04-23 NOTE — Patient Instructions (Addendum)
It was great seeing you today!  Plan discussed at today's visit: -Blood work ordered today, results will be uploaded to MyChart.  -Urine test and vaginal swab today as well  -Make sure you pick up Lexapro 20 mg  Follow up in: 3 months   Take care and let us know if you have any questions or concerns prior to your next visit.  Dr. Caralee Ates

## 2023-04-23 NOTE — Assessment & Plan Note (Signed)
Recheck labs today now that she is on a statin.

## 2023-04-23 NOTE — Assessment & Plan Note (Signed)
Recheck A1c, microalbumin today.  Foot exam today as well.  Continue metformin 500 mg daily

## 2023-04-23 NOTE — Assessment & Plan Note (Signed)
Following with oncology, note reviewed from 01/14/2023.  Reviewed CT scan from yesterday which potentially shows further metastatic disease.

## 2023-04-24 LAB — COMPLETE METABOLIC PANEL WITH GFR
AG Ratio: 1.3 (calc) (ref 1.0–2.5)
ALT: 16 U/L (ref 6–29)
AST: 24 U/L (ref 10–35)
Albumin: 4.4 g/dL (ref 3.6–5.1)
Alkaline phosphatase (APISO): 63 U/L (ref 37–153)
BUN: 11 mg/dL (ref 7–25)
CO2: 28 mmol/L (ref 20–32)
Calcium: 10 mg/dL (ref 8.6–10.4)
Chloride: 103 mmol/L (ref 98–110)
Creat: 0.74 mg/dL (ref 0.50–1.05)
Globulin: 3.3 g/dL (ref 1.9–3.7)
Glucose, Bld: 116 mg/dL — ABNORMAL HIGH (ref 65–99)
Potassium: 3.4 mmol/L — ABNORMAL LOW (ref 3.5–5.3)
Sodium: 142 mmol/L (ref 135–146)
Total Bilirubin: 0.7 mg/dL (ref 0.2–1.2)
Total Protein: 7.7 g/dL (ref 6.1–8.1)
eGFR: 92 mL/min/{1.73_m2} (ref >=60)

## 2023-04-24 LAB — CBC WITH DIFFERENTIAL/PLATELET
Absolute Lymphocytes: 846 {cells}/uL — ABNORMAL LOW (ref 850–3900)
Absolute Monocytes: 308 {cells}/uL (ref 200–950)
Basophils Absolute: 18 {cells}/uL (ref 0–200)
Basophils Relative: 0.4 %
Eosinophils Absolute: 69 {cells}/uL (ref 15–500)
Eosinophils Relative: 1.5 %
HCT: 40.4 % (ref 35.0–45.0)
Hemoglobin: 13.3 g/dL (ref 11.7–15.5)
MCH: 28.3 pg (ref 27.0–33.0)
MCHC: 32.9 g/dL (ref 32.0–36.0)
MCV: 86 fL (ref 80.0–100.0)
MPV: 10.3 fL (ref 7.5–12.5)
Monocytes Relative: 6.7 %
Neutro Abs: 3358 {cells}/uL (ref 1500–7800)
Neutrophils Relative %: 73 %
Platelets: 81 10*3/uL — ABNORMAL LOW (ref 140–400)
RBC: 4.7 10*6/uL (ref 3.80–5.10)
RDW: 14.4 % (ref 11.0–15.0)
Total Lymphocyte: 18.4 %
WBC: 4.6 10*3/uL (ref 3.8–10.8)

## 2023-04-24 LAB — LIPID PANEL
Cholesterol: 177 mg/dL (ref <200)
HDL: 62 mg/dL (ref >=50)
LDL Cholesterol (Calc): 90 mg/dL
Non-HDL Cholesterol (Calc): 115 mg/dL (ref <130)
Total CHOL/HDL Ratio: 2.9 (calc) (ref <5.0)
Triglycerides: 148 mg/dL (ref <150)

## 2023-04-24 LAB — MICROALBUMIN / CREATININE URINE RATIO
Creatinine, Urine: 176 mg/dL (ref 20–275)
Microalb Creat Ratio: 15 mg/g{creat} (ref <30)
Microalb, Ur: 2.6 mg/dL

## 2023-04-24 LAB — HEMOGLOBIN A1C
Hgb A1c MFr Bld: 7.5 %{Hb} — ABNORMAL HIGH (ref <5.7)
Mean Plasma Glucose: 169 mg/dL
eAG (mmol/L): 9.3 mmol/L

## 2023-04-26 LAB — CERVICOVAGINAL ANCILLARY ONLY
Bacterial Vaginitis (gardnerella): POSITIVE — AB
Candida Glabrata: NEGATIVE
Candida Vaginitis: NEGATIVE
Chlamydia: NEGATIVE
Comment: NEGATIVE
Comment: NEGATIVE
Comment: NEGATIVE
Comment: NEGATIVE
Comment: NEGATIVE
Comment: NORMAL
Neisseria Gonorrhea: NEGATIVE
Trichomonas: NEGATIVE

## 2023-04-27 MED ORDER — METFORMIN HCL 500 MG PO TABS: 500.0000 mg | ORAL_TABLET | Freq: Two times a day (BID) | ORAL | 1 refills | Status: DC

## 2023-04-27 MED ORDER — METRONIDAZOLE 500 MG PO TABS: 500.0000 mg | ORAL_TABLET | Freq: Two times a day (BID) | ORAL | 0 refills | Status: AC

## 2023-04-27 NOTE — Addendum Note (Signed)
Addended by: Margarita Mail on: 04/27/2023 12:55 PM   Modules accepted: Orders

## 2023-04-28 ENCOUNTER — Inpatient Hospital Stay: Payer: MEDICAID | Attending: Radiation Oncology

## 2023-04-28 ENCOUNTER — Telehealth: Payer: Self-pay

## 2023-04-28 ENCOUNTER — Inpatient Hospital Stay (HOSPITAL_BASED_OUTPATIENT_CLINIC_OR_DEPARTMENT_OTHER): Payer: MEDICAID | Admitting: Oncology

## 2023-04-28 ENCOUNTER — Inpatient Hospital Stay: Payer: MEDICAID

## 2023-04-28 VITALS — BP 168/92 | HR 77 | Temp 98.1°F | Resp 18

## 2023-04-28 DIAGNOSIS — F323 Major depressive disorder, single episode, severe with psychotic features: Secondary | ICD-10-CM

## 2023-04-28 DIAGNOSIS — C50412 Malignant neoplasm of upper-outer quadrant of left female breast: Secondary | ICD-10-CM | POA: Insufficient documentation

## 2023-04-28 DIAGNOSIS — D72819 Decreased white blood cell count, unspecified: Secondary | ICD-10-CM | POA: Diagnosis not present

## 2023-04-28 DIAGNOSIS — D696 Thrombocytopenia, unspecified: Secondary | ICD-10-CM

## 2023-04-28 DIAGNOSIS — Z801 Family history of malignant neoplasm of trachea, bronchus and lung: Secondary | ICD-10-CM | POA: Diagnosis not present

## 2023-04-28 DIAGNOSIS — R222 Localized swelling, mass and lump, trunk: Secondary | ICD-10-CM | POA: Diagnosis not present

## 2023-04-28 DIAGNOSIS — C50511 Malignant neoplasm of lower-outer quadrant of right female breast: Secondary | ICD-10-CM | POA: Insufficient documentation

## 2023-04-28 DIAGNOSIS — Z9013 Acquired absence of bilateral breasts and nipples: Secondary | ICD-10-CM | POA: Insufficient documentation

## 2023-04-28 DIAGNOSIS — F322 Major depressive disorder, single episode, severe without psychotic features: Secondary | ICD-10-CM | POA: Diagnosis not present

## 2023-04-28 DIAGNOSIS — Z923 Personal history of irradiation: Secondary | ICD-10-CM | POA: Insufficient documentation

## 2023-04-28 DIAGNOSIS — Z79811 Long term (current) use of aromatase inhibitors: Secondary | ICD-10-CM | POA: Insufficient documentation

## 2023-04-28 DIAGNOSIS — Z170 Estrogen receptor positive status [ER+]: Secondary | ICD-10-CM | POA: Diagnosis not present

## 2023-04-28 DIAGNOSIS — C50512 Malignant neoplasm of lower-outer quadrant of left female breast: Secondary | ICD-10-CM | POA: Insufficient documentation

## 2023-04-28 DIAGNOSIS — C50912 Malignant neoplasm of unspecified site of left female breast: Secondary | ICD-10-CM

## 2023-04-28 DIAGNOSIS — Z803 Family history of malignant neoplasm of breast: Secondary | ICD-10-CM | POA: Diagnosis not present

## 2023-04-28 DIAGNOSIS — D72810 Lymphocytopenia: Secondary | ICD-10-CM

## 2023-04-28 DIAGNOSIS — R161 Splenomegaly, not elsewhere classified: Secondary | ICD-10-CM | POA: Diagnosis not present

## 2023-04-28 DIAGNOSIS — Z809 Family history of malignant neoplasm, unspecified: Secondary | ICD-10-CM | POA: Insufficient documentation

## 2023-04-28 DIAGNOSIS — C50812 Malignant neoplasm of overlapping sites of left female breast: Secondary | ICD-10-CM | POA: Diagnosis present

## 2023-04-28 LAB — CBC WITH DIFFERENTIAL (CANCER CENTER ONLY)
Abs Immature Granulocytes: 0.01 10*3/uL (ref 0.00–0.07)
Basophils Absolute: 0 10*3/uL (ref 0.0–0.1)
Basophils Relative: 1 %
Eosinophils Absolute: 0 10*3/uL (ref 0.0–0.5)
Eosinophils Relative: 2 %
HCT: 36.4 % (ref 36.0–46.0)
Hemoglobin: 11.9 g/dL — ABNORMAL LOW (ref 12.0–15.0)
Immature Granulocytes: 0 %
Lymphocytes Relative: 19 %
Lymphs Abs: 0.5 10*3/uL — ABNORMAL LOW (ref 0.7–4.0)
MCH: 28.3 pg (ref 26.0–34.0)
MCHC: 32.7 g/dL (ref 30.0–36.0)
MCV: 86.7 fL (ref 80.0–100.0)
Monocytes Absolute: 0.2 10*3/uL (ref 0.1–1.0)
Monocytes Relative: 8 %
Neutro Abs: 1.8 10*3/uL (ref 1.7–7.7)
Neutrophils Relative %: 70 %
Platelet Count: 54 10*3/uL — ABNORMAL LOW (ref 150–400)
RBC: 4.2 MIL/uL (ref 3.87–5.11)
RDW: 14.6 % (ref 11.5–15.5)
WBC Count: 2.6 10*3/uL — ABNORMAL LOW (ref 4.0–10.5)
nRBC: 0 % (ref 0.0–0.2)

## 2023-04-28 LAB — CMP (CANCER CENTER ONLY)
ALT: 18 U/L (ref 0–44)
AST: 24 U/L (ref 15–41)
Albumin: 3.9 g/dL (ref 3.5–5.0)
Alkaline Phosphatase: 56 U/L (ref 38–126)
Anion gap: 10 (ref 5–15)
BUN: 18 mg/dL (ref 8–23)
CO2: 24 mmol/L (ref 22–32)
Calcium: 10.1 mg/dL (ref 8.9–10.3)
Chloride: 106 mmol/L (ref 98–111)
Creatinine: 0.56 mg/dL (ref 0.44–1.00)
GFR, Estimated: >60 mL/min (ref >60)
Glucose, Bld: 171 mg/dL — ABNORMAL HIGH (ref 70–99)
Potassium: 3.9 mmol/L (ref 3.5–5.1)
Sodium: 140 mmol/L (ref 135–145)
Total Bilirubin: 0.6 mg/dL (ref 0.0–1.2)
Total Protein: 7.2 g/dL (ref 6.5–8.1)

## 2023-04-28 NOTE — Assessment & Plan Note (Addendum)
Etiology unknown.   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  A definitive morphologic etiology for this pancytopenia is not noted. Negative cytogenetics, negative myeloid FISH testing. Repeat lab work showed stable thrombocytopenia.  Continue to monitor.  Continue B12 supplementation.

## 2023-04-28 NOTE — Telephone Encounter (Signed)
Per LOS   CT guided biopsy of chest wall nodule.  MD 7-10days after biopsy.   Request for biopsy sent to IR

## 2023-04-28 NOTE — Assessment & Plan Note (Signed)
Bilateral breast invasive lobular carcinoma, s/p bilateral mastectomy. [name]   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 I recommend CT-guided biopsy of the right chest wall nodule for confirmation.  Recommend to continue Arimidex 1mg  daily, I will hold off fulvestrant injections.  Further plan depending on biopsy results.

## 2023-04-28 NOTE — Progress Notes (Signed)
Pt here for follow up. No new concerns voiced.

## 2023-04-28 NOTE — Assessment & Plan Note (Signed)
[name]

## 2023-04-28 NOTE — Assessment & Plan Note (Signed)
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  Drug induced leukopenia vs other etiologies.  Continue observation.

## 2023-04-28 NOTE — Progress Notes (Signed)
Hematology/Oncology Progress note [phone] [phone]       CHIEF COMPLAINTS/REASON FOR VISIT:  bilateral breast cancer, thrombocytopenia, leukopenia  ASSESSMENT & PLAN:   Cancer Staging  Malignant neoplasm of left breast, stage 4 (HCC) Staging form: Breast, AJCC 8th Edition - Pathologic stage from 11/25/2021: Stage IB (pT2, pN1, cM0, G2, ER+, PR+, HER2-) - Signed by Rickard Patience, MD on 11/25/2021 - Pathologic stage from 07/25/2022: Stage IV (rpTX, pNX, pM1, ER+, PR: Not Assessed, HER2-) - Signed by Rickard Patience, MD on 08/14/2022  Malignant neoplasm of lower-outer quadrant of right breast of female, estrogen receptor positive (HCC) Staging form: Breast, AJCC 8th Edition - Pathologic stage from 11/25/2021: Stage IIIA (pT1c, pN3a, cM0, G2, ER+, PR+, HER2-) - Signed by Rickard Patience, MD on 11/25/2021   Malignant neoplasm of left breast, 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 [name]   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 I recommend CT-guided biopsy of the right chest wall nodule for confirmation.  Recommend to continue Arimidex 1mg  daily, I will hold off fulvestrant injections.  Further  plan depending on biopsy results.   Severe major depression, single episode, with psychotic features, mood-congruent (HCC) [name]    Thrombocytopenia (HCC) Etiology unknown.   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  A definitive morphologic etiology for this pancytopenia is not noted. Negative cytogenetics, negative myeloid FISH testing. Repeat lab work showed stable thrombocytopenia.  Continue to monitor.  Continue B12 supplementation.     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  Drug induced leukopenia vs other etiologies.  Continue observation.  Plan was discussed with patient and her husband  Orders Placed This Encounter  Procedures   CT BIOPSY    Standing Status:   Future    Expected Date:   05/05/2023    Expiration Date:   04/27/2024    Lab orders requested (DO NOT place separate lab orders, these will be automatically ordered during procedure specimen collection)::   Surgical Pathology    Reason for Exam (SYMPTOM  OR DIAGNOSIS REQUIRED):   ct guided biopsy of chest wall nodule    Preferred location?:   [facility]   Follow up  1 week after biopsy  [name], questions or concerns.  Rickard Patience, MD, PhD [facility] Hematology Oncology 04/28/2023     HISTORY OF PRESENTING ILLNESS:   is a  63 y.o.  female presents for follow up of bilateral breast cancer, thrombocytopenia and leukopenia.  Oncology History  Malignant neoplasm of left breast, stage 4 (HCC)  08/06/2021 Mammogram  08/06/2021, digital bilateral mammogram and ultrasound showed left breast 3:00 mass, 1.7 x 1.7 x 1.7 cm, ultrasound of the left axillary is negative. 08/21/2021 right  breast 1 x 0.7 x 0.8 cm angulated spiculated mass at the right breast 7:00, 5 cm from nipple.  Ultrasound of the right axillary demonstrates 3 abnormal thickened cortex lymph node. 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 Bary Richard right breast 7:00, invasive mammary carcinoma with lobular features, in situ carcinoma,  ER+, PR+, HER 2-This was found to be concordant by Dr. Bary Richard. right axilla lymph node +, extracapsular extension.  -This was found to be concordant by Dr. Bary Richard   08/28/2021 Initial Diagnosis   Malignant neoplasm of upper-outer quadrant of left breast in female, estrogen receptor positive (HCC)   08/28/2021 Imaging   CT CHEST, ABDOMEN, AND PELVIS WITH CONTRAST Asymmetric mildly enlarged right axillary nodes. Correlate with biopsy. There are some punctate foci of sclerosis in the included spine that could reflect subtle metastatic disease. Nonobstructing bilateral renal calculi.   08/29/2021 Imaging   BILATERAL BREAST MRI WITH AND WITHOUT CONTRAST 1. 2.5 centimeter enhancing mass in the LOWER OUTER QUADRANT of the RIGHT breast, correlating with known malignancy. 2. At least 4 enlarged RIGHT axillary lymph nodes, correlating well with recently biopsied lymph node showing metastatic disease. 3. 3.4 centimeter enhancing mass in the LEFT breast, with an additional 2.9 centimeters of non mass enhancement posterior to the mass also suspicious for malignancy. This likely correlates with the area calcifications seen mammographically. 4. 5 millimeters satellite nodule along the LATERAL aspect of known malignancy in the LOWER OUTER QUADRANT of the LEFT breast. 5. LEFT axilla is negative for adenopathy.       09/05/2021 Imaging   NUCLEAR MEDICINE WHOLE BODY BONE SCAN Uptake at L2 which is nonspecific but  may be related to advanced degenerative disc and facet disease changes at both L1-L2 and L2-L3; no evidence of osseous metastatic disease by CT. No definite osseous metastatic lesions identified.    Genetic Testing   Negative genetic testing. No pathogenic variants identified on the [facility] CancerNext-Expanded+RNA panel. The report date is 10/03/2021.  The CancerNext-Expanded + RNAinsight gene panel offered by [employer] and includes sequencing and rearrangement analysis for the following 77 genes: IP, ALK, APC*, ATM*, AXIN2, BAP1, BARD1, BLM, BMPR1A, BRCA1*, BRCA2*, BRIP1*, CDC73, CDH1*,CDK4, CDKN1B, CDKN2A, CHEK2*, CTNNA1, DICER1, FANCC, FH, FLCN, GALNT12, KIF1B, LZTR1, MAX, MEN1, MET, MLH1*, MSH2*, MSH3, MSH6*, MUTYH*, NBN, NF1*, NF2, NTHL1, PALB2*, PHOX2B, PMS2*, POT1, PRKAR1A, PTCH1, PTEN*, RAD51C*, RAD51D*,RB1, RECQL, RET, SDHA, SDHAF2, SDHB, SDHC, SDHD, SMAD4, SMARCA4, SMARCB1, SMARCE1, STK11, SUFU, TMEM127, TP53*,TSC1, TSC2, VHL and XRCC2 (sequencing and deletion/duplication); EGFR, EGLN1, HOXB13, KIT, MITF, PDGFRA, POLD1 and POLE (sequencing only); EPCAM and GREM1 (deletion/duplication only).   11/15/2021 Surgery   She underwent left simple mastectomy and right modified mastectomy.   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  pT2 pN1a, ER +90%, PR +90%, HER2 negative (1+)  # Right Breast invasive lobular carcinoma Grade 2, negative margins, right axillary lymph nodes  30/32 involved with metastatic carcinoma, pT1c pN3a ER +90%, PR +51-90%, HER2 negative (1+)    11/25/2021 Cancer Staging   Staging  form: Breast, AJCC 8th Edition - Pathologic stage from 11/25/2021: Stage IB (pT2, pN1, cM0, G2, ER+, PR+, HER2-) - Signed by Rickard Patience, MD on 11/25/2021 Stage prefix: Initial  diagnosis Multigene prognostic tests performed: None Histologic grading system: 3 grade system   01/09/2022 - 03/10/2022 Radiation Therapy   Adjuvant breast Radiation.    07/25/2022 Cancer Staging   Staging form: Breast, AJCC 8th Edition - Pathologic stage from 07/25/2022: Stage IV (rpTX, pNX, pM1, ER+, PR: Not Assessed, HER2-) - Signed by Rickard Patience, MD on 08/14/2022 Stage prefix: Recurrence Multigene prognostic tests performed: None   07/25/2022 Procedure   Colonoscopy showed Findings - Six 5 to 6 mm polyps in the ascending colon, removed with a cold snare. Resected and retrieved. - One 10 mm polyp in the descending colon, removed with a cold snare. Resected and retrieved. Clip was placed. - One 30 mm polyp in the rectum, removed with a hot snare. Resected and retrieved. Clips were placed.  Pathology showed A.  COLON POLYP X 6, ASCENDING AND CECUM; COLD SNARE: - COLONIC MUCOSA WITH METASTATIC LOBULAR BREAST CARCINOMA (3) - HYPERPLASTIC POLYP (3)  B.  COLON POLYP, DESCENDING; COLD SNARE: - TUBULAR ADENOMA. - NEGATIVE FOR HIGH-GRADE DYSPLASIA AND MALIGNANCY.  C.  RECTUM POLYP; HOT SNARE: - TUBULOVILLOUS ADENOMA. - NEGATIVE FOR HIGH-GRADE DYSPLASIA AND MALIGNANCY.  Comment: The metastatic lobular breast carcinoma is positive for GATA-3 and ER (strong staining in greater than 90% of the tumor cells). HER2 negative (1+)     08/06/2022 Imaging   PET  Status post bilateral mastectomy with radiation changes in the anterior upper lobes.   No findings suspicious for recurrent or metastatic disease   09/18/2022 Imaging   MRI brain w wo  1. No evidence of intracranial metastatic disease. 2. Mild multifocal T2 FLAIR hyperintense signal abnormality within the cerebral white matter, nonspecific but most often secondary to chronic small vessel ischemia. 3. Paranasal sinus disease as described.   01/27/2023 Imaging   [place] left axillary No sonographic evidence of malignancy at the site of  palpable concern in the LEFT axilla.    04/22/2023 Imaging   CT chest abdomen pelvis w contrast showed 1. New right chest wall soft tissue lesion which may represent locally recurrent or metastatic disease. Attention on follow-up. 2. New right seventh rib sclerotic osseous lesion. 3. Splenomegaly.    Malignant neoplasm of lower-outer quadrant of right breast of female, estrogen receptor positive (HCC)  08/06/2021 Mammogram   08/06/2021, digital bilateral mammogram and ultrasound showed left breast 3:00 mass, 1.7 x 1.7 x 1.7 cm, ultrasound of the left axillary is negative. 08/21/2021 right breast 1 x 0.7 x 0.8 cm angulated spiculated mass at the right breast 7:00, 5 cm from nipple.  Ultrasound of the right axillary demonstrates 3 abnormal thickened cortex lymph node. 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 Bary Richard right breast 7:00, invasive mammary carcinoma with lobular features, in situ carcinoma,  ER+, PR+, HER 2-This was found to be concordant by Dr. Bary Richard. right axilla lymph node +, extracapsular extension.  -This was found to be concordant by Dr. Bary Richard   08/28/2021 Initial Diagnosis   Malignant neoplasm of lower-outer quadrant of right breast of female, estrogen receptor positive (HCC)   08/28/2021 Imaging   CT CHEST, ABDOMEN, AND PELVIS WITH CONTRAST Asymmetric mildly enlarged  right axillary nodes. Correlate with biopsy. There are some punctate foci of sclerosis in the included spine that could reflect subtle metastatic disease. Nonobstructing bilateral renal calculi.   08/29/2021 Imaging   BILATERAL BREAST MRI WITH AND WITHOUT CONTRAST 1. 2.5 centimeter enhancing mass in the LOWER OUTER QUADRANT of the RIGHT breast, correlating with known malignancy. 2. At least 4 enlarged RIGHT axillary lymph  nodes, correlating well with recently biopsied lymph node showing metastatic disease. 3. 3.4 centimeter enhancing mass in the LEFT breast, with an additional 2.9 centimeters of non mass enhancement posterior to the mass also suspicious for malignancy. This likely correlates with the area calcifications seen mammographically. 4. 5 millimeters satellite nodule along the LATERAL aspect of known malignancy in the LOWER OUTER QUADRANT of the LEFT breast. 5. LEFT axilla is negative for adenopathy.       09/05/2021 Imaging   NUCLEAR MEDICINE WHOLE BODY BONE SCAN Uptake at L2 which is nonspecific but may be related to advanced degenerative disc and facet disease changes at both L1-L2 and L2-L3; no evidence of osseous metastatic disease by CT. No definite osseous metastatic lesions identified.   09/09/2021 Oncotype testing   [id number] block RIGHT 7:00 5 CM FN; ULTRASOUND-GUIDED BIOPSY Oncotype Dx RS score 22    Genetic Testing   Negative genetic testing. No pathogenic variants identified on the [facility] CancerNext-Expanded+RNA panel. The report date is 10/03/2021.  The CancerNext-Expanded + RNAinsight gene panel offered by [employer] and includes sequencing and rearrangement analysis for the following 77 genes: IP, ALK, APC*, ATM*, AXIN2, BAP1, BARD1, BLM, BMPR1A, BRCA1*, BRCA2*, BRIP1*, CDC73, CDH1*,CDK4, CDKN1B, CDKN2A, CHEK2*, CTNNA1, DICER1, FANCC, FH, FLCN, GALNT12, KIF1B, LZTR1, MAX, MEN1, MET, MLH1*, MSH2*, MSH3, MSH6*, MUTYH*, NBN, NF1*, NF2, NTHL1, PALB2*, PHOX2B, PMS2*, POT1, PRKAR1A, PTCH1, PTEN*, RAD51C*, RAD51D*,RB1, RECQL, RET, SDHA, SDHAF2, SDHB, SDHC, SDHD, SMAD4, SMARCA4, SMARCB1, SMARCE1, STK11, SUFU, TMEM127, TP53*,TSC1, TSC2, VHL and XRCC2 (sequencing and deletion/duplication); EGFR, EGLN1, HOXB13, KIT, MITF, PDGFRA, POLD1 and POLE (sequencing only); EPCAM and GREM1 (deletion/duplication only).   11/15/2021 Surgery   She underwent left simple mastectomy and right modified  mastectomy.   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  pT2 pN1a, ER +90%, PR +90%, HER2 negative (1+)  # Right Breast invasive lobular carcinoma Grade 2, negative margins, right axillary lymph nodes  30/32 involved with metastatic carcinoma, pT1c pN3a ER +90%, PR +51-90%, HER2 negative (1+)    11/25/2021 Cancer Staging   Staging form: Breast, AJCC 8th Edition - Pathologic stage from 11/25/2021: Stage IIIA (pT1c, pN3a, cM0, G2, ER+, PR+, HER2-) - Signed by Rickard Patience, MD on 11/25/2021 Stage prefix: Initial diagnosis Multigene prognostic tests performed: None Histologic grading system: 3 grade system   12/10/2021 Imaging   PET scan  1. Interval bilateral mastectomy and right axillary node dissection.No evidence of residual disease in the chest wall, nodal metastases or distant metastases. 2. Focal hypermetabolic activity in the proximal rectum corresponding with an intraluminal polypoid lesion, suspicious for a villous adenoma or early colon cancer. Sigmoidoscopy/colonoscopy recommended unless recently performed   12/10/2021 Imaging   1. Interval bilateral mastectomy and right axillary node dissection.No evidence of residual disease in the chest wall, nodal metastasesor distant metastases. 2. Focal hypermetabolic activity in the proximal rectum corresponding with an intraluminal polypoid lesion, suspicious for a villous adenoma or early colon cancer. Sigmoidoscopy/colonoscopy recommended unless recently  performed    She is a poor historian. History of major depression, psychosis, previously on olanzapine and Remeron not currently on any and if not currently following up with psychiatrist. Patient's family history is positive for sister with breast cancer   INTERVAL  HISTORY Sharon Woods is a 63 y.o. female who has above history reviewed by me today presents for follow up visit for management of bilateral breast cancer She has been on Lexapro since Oct 2023.  Take Arimidex 1mg  daily since Dec 2023, she reports some muscle/joint pain but overall manageable.  She tolerates Fulvestrant   Review of Systems  Constitutional:  Negative for appetite change, chills, fatigue and fever.  HENT:   Negative for hearing loss and voice change.   Eyes:  Negative for eye problems.  Respiratory:  Negative for chest tightness and cough.   Cardiovascular:  Negative for chest pain.  Gastrointestinal:  Negative for abdominal distention, abdominal pain and blood in stool.  Endocrine: Negative for hot flashes.  Genitourinary:  Negative for difficulty urinating and frequency.   Musculoskeletal:  Negative for arthralgias.  Skin:  Negative for itching and rash.  Neurological:  Negative for extremity weakness.  Hematological:  Negative for adenopathy.  Psychiatric/Behavioral:  Negative for confusion.     MEDICAL HISTORY:  Past Medical History:  Diagnosis Date   Anxiety    Cancer (HCC)    Depression    Diabetes mellitus without complication (HCC)    History of high cholesterol 2000   Hyperlipidemia    Hypertension    Patient denies medical problems    Thrombocytopenia (HCC)     SURGICAL HISTORY: Past Surgical History:  Procedure Laterality Date   ABDOMINAL HYSTERECTOMY     AXILLARY SENTINEL NODE BIOPSY Left 11/15/2021   Procedure: AXILLARY SENTINEL NODE BIOPSY;  Surgeon: Campbell Lerner, MD;  Location: [facility] ORS;  Service: General;  Laterality: Left;   BREAST BIOPSY Right 08/21/2021   [place] bx 7:00 mass coil clip path pending   BREAST BIOPSY Right 08/21/2021   [place] bx of LN, hydro marker, path pending   BREAST BIOPSY Left 08/21/2021   [place] bx, heart marker, path pending   CESAREAN SECTION     x2   COLONOSCOPY WITH PROPOFOL N/A 07/25/2022   Procedure: COLONOSCOPY  WITH PROPOFOL;  Surgeon: Wyline Mood, MD;  Location: [facility] ENDOSCOPY;  Service: Gastroenterology;  Laterality: N/A;   MASTECTOMY     MASTECTOMY MODIFIED RADICAL Right 11/15/2021   Procedure: MASTECTOMY MODIFIED RADICAL;  Surgeon: Campbell Lerner, MD;  Location: [facility] ORS;  Service: General;  Laterality: Right;   NO PAST SURGERIES     TOTAL MASTECTOMY Left 11/15/2021   Procedure: TOTAL MASTECTOMY;  Surgeon: Campbell Lerner, MD;  Location: [facility] ORS;  Service: General;  Laterality: Left;    SOCIAL HISTORY: Social History   Socioeconomic History   Marital status: Married    Spouse name: Not on file   Number of children: Not on file   Years of education: Not on file   Highest education level: Not on file  Occupational History   Not on file  Tobacco Use   Smoking status: Former    Current packs/day: 0.00    Types: Cigarettes    Quit date: 2013    Years since quitting: 12.0    Passive exposure: Current   Smokeless tobacco: Never  Vaping Use   Vaping status: Never Used  Substance and Sexual Activity   Alcohol use: Not Currently    Comment: occasionally  Drug use: No   Sexual activity: Not Currently    Comment: unable to assess   Other Topics Concern   Not on file  Social History Narrative   Not on file   Social Drivers of Health   Financial Resource Strain: Medium Risk (10/10/2021)   Overall Financial Resource Strain (CARDIA)    Difficulty of Paying Living Expenses: Somewhat hard  Food Insecurity: No Food Insecurity (10/10/2021)   Hunger Vital Sign    Worried About Running Out of Food in the Last Year: Never true    Ran Out of Food in the Last Year: Never true  Transportation Needs: Unmet Transportation Needs (01/21/2022)   PRAPARE - Transportation    Lack of Transportation (Medical): Yes    Lack of Transportation (Non-Medical): Yes  Physical Activity: Inactive (10/10/2021)   Exercise Vital Sign    Days of Exercise per Week: 0 days    Minutes of Exercise per Session: 0  min  Stress: Stress Concern Present (10/10/2021)   [employer] of Occupational Health - Occupational Stress Questionnaire    Feeling of Stress : To some extent  Social Connections: Socially Isolated (10/10/2021)   Social Connection and Isolation Panel [NHANES]    Frequency of Communication with Friends and Family: Once a week    Frequency of Social Gatherings with Friends and Family: Never    Attends Religious Services: Never    Diplomatic Services operational officer: No    Attends Engineer, structural: Never    Marital Status: Married  Catering manager Violence: Not on file    FAMILY HISTORY: Family History  Problem Relation Age of Onset   Lung cancer Mother    Dementia Mother    Parkinson's disease Father    Cancer Father        unk type   Diabetes Sister    Heart attack Sister    Breast cancer Sister    Cancer Maternal Uncle        unk types   Dementia Maternal Grandmother    Cancer Maternal Grandmother        unk type   Cancer Other    Dementia Other     ALLERGIES:  is allergic to tylenol [acetaminophen] and aleve [naproxen].  MEDICATIONS:  Current Outpatient Medications  Medication Sig Dispense Refill   ACCU-CHEK GUIDE test strip USE TO CHECK BLOOD SUGAR UP TO 4 TIMES DAILY AS DIRECTED 100 each 0   Accu-Chek Softclix Lancets lancets USE TO CHECK BLOOD SUAGR UP TO 4 TIMES DAILY AS DIRECTED 100 each 0   anastrozole (ARIMIDEX) 1 MG tablet Take 1 tablet by mouth once daily 90 tablet 0   blood glucose meter kit and supplies KIT Dispense based on patient and insurance preference. Use up to four times daily as directed. 1 each 1   calcium carbonate (OS-CAL) 600 MG TABS tablet Take 2 tablets (1,200 mg total) by mouth daily. 60 tablet 5   cyanocobalamin (VITAMIN B12) 1000 MCG tablet Take 1 tablet (1,000 mcg total) by mouth daily. 30 tablet 2   escitalopram (LEXAPRO) 20 MG tablet Take 1 tablet (20 mg total) by mouth daily. 90 tablet 1   hydrOXYzine (ATARAX) 10  MG tablet TAKE 1 TABLET BY MOUTH TWICE DAILY AS NEEDED FOR ITCHING OR ANXIETY 180 tablet 0   metFORMIN (GLUCOPHAGE) 500 MG tablet Take 1 tablet (500 mg total) by mouth 2 (two) times daily with a meal. 180 tablet 1   metroNIDAZOLE (FLAGYL) 500 MG tablet  Take 1 tablet (500 mg total) by mouth 2 (two) times daily for 7 days. 14 tablet 0   rosuvastatin (CRESTOR) 5 MG tablet Take 1 tablet by mouth once daily 90 tablet 0   triamcinolone ointment (KENALOG) 0.5 % Apply 1 Application topically 2 (two) times daily. 30 g 1   No current facility-administered medications for this visit.     PHYSICAL EXAMINATION: ECOG PERFORMANCE STATUS: 1 - Symptomatic but completely ambulatory Vitals:   04/28/23 1418  BP: (!) 168/92  Pulse: 77  Resp: 18  Temp: 98.1 F (36.7 C)    There were no vitals filed for this visit.   Physical Exam Constitutional:      General: She is not in acute distress. HENT:     Head: Normocephalic and atraumatic.  Eyes:     General: No scleral icterus. Cardiovascular:     Rate and Rhythm: Normal rate.  Pulmonary:     Effort: Pulmonary effort is normal. No respiratory distress.     Breath sounds: No wheezing.  Abdominal:     General: There is no distension.  Musculoskeletal:        General: No deformity. Normal range of motion.     Cervical back: Normal range of motion and neck supple.  Skin:    Coloration: Skin is not jaundiced.  Neurological:     Mental Status: She is alert and oriented to person, place, and time. Mental status is at baseline.     LABORATORY DATA:  I have reviewed the data as listed    Latest Ref Rng & Units 04/28/2023    1:57 PM 04/23/2023    4:01 PM 01/14/2023    2:20 PM  CBC  WBC 4.0 - 10.5 K/uL 2.6  4.6  3.7   Hemoglobin 12.0 - 15.0 g/dL 13.2  44.0  10.2   Hematocrit 36.0 - 46.0 % 36.4  40.4  36.5   Platelets 150 - 400 K/uL 54  81  66       Latest Ref Rng & Units 04/28/2023    1:57 PM 04/23/2023    4:01 PM 01/14/2023    2:20 PM  CMP   Glucose 70 - 99 mg/dL 725  366  440   BUN 8 - 23 mg/dL 18  11  13    Creatinine 0.44 - 1.00 mg/dL 3.47  4.25  9.56   Sodium 135 - 145 mmol/L 140  142  140   Potassium 3.5 - 5.1 mmol/L 3.9  3.4  3.4   Chloride 98 - 111 mmol/L 106  103  107   CO2 22 - 32 mmol/L 24  28  24    Calcium 8.9 - 10.3 mg/dL 38.7  56.4  9.3   Total Protein 6.5 - 8.1 g/dL 7.2  7.7  7.3   Total Bilirubin 0.0 - 1.2 mg/dL 0.6  0.7  0.6   Alkaline Phos 38 - 126 U/L 56   56   AST 15 - 41 U/L 24  24  25    ALT 0 - 44 U/L 18  16  15      RADIOGRAPHIC STUDIES: I have personally reviewed the radiological images as listed and agreed with the findings in the report. CT CHEST ABDOMEN PELVIS W CONTRAST Result Date: 04/22/2023 CLINICAL DATA:  breast cancer EXAM: CT CHEST, ABDOMEN, AND PELVIS WITH CONTRAST TECHNIQUE: Multidetector CT imaging of the chest, abdomen and pelvis was performed following the standard protocol during bolus administration of intravenous contrast. RADIATION DOSE REDUCTION: This exam  was performed according to the departmental dose-optimization program which includes automated exposure control, adjustment of the mA and/or kV according to patient size and/or use of iterative reconstruction technique. CONTRAST:  OMNIPAQUE IOHEXOL 300 MG/ML  SOLN COMPARISON:  CT chest abdomen and pelvis 08/28/2021. PET-CT 08/09/2022. FINDINGS: CT CHEST FINDINGS Cardiovascular: No significant vascular findings. Normal heart size. No pericardial effusion. Mediastinum/Nodes: No enlarged mediastinal, hilar, or axillary lymph nodes. Thyroid gland, trachea, and esophagus demonstrate no significant findings. Lungs/Pleura: Lungs are clear. No pleural effusion or pneumothorax. Musculoskeletal: Focal region of soft tissue nodularity along the lateral margin of the right pectoralis muscle measuring approximately 1.6 x 1.2 cm (19:128) which appears new from prior. Prior bilateral mastectomy. CT ABDOMEN PELVIS FINDINGS Hepatobiliary: No focal  liver abnormality is seen. No gallstones, gallbladder wall thickening, or biliary dilatation. Pancreas: Unremarkable. No pancreatic ductal dilatation or surrounding inflammatory changes. Spleen: Splenomegaly measuring 17 cm cc. Adrenals/Urinary Tract: Adrenal glands are unremarkable. Kidneys are normal without focal lesion or hydronephrosis. Simple cortical cysts. Nonobstructing bilateral lower pole calculi measuring up to 5 mm on the left. Bladder is unremarkable. Stomach/Bowel: Stomach is within normal limits. Appendix appears normal. Colonic diverticulosis. No evidence of bowel wall thickening, distention, or inflammatory changes. Vascular/Lymphatic: No significant vascular findings are present. No enlarged abdominal or pelvic lymph nodes. Reproductive: Status post hysterectomy. No adnexal masses. Other: No abdominal wall hernia or abnormality. Trace abdominopelvic ascites. Musculoskeletal: New, sclerotic osseous lesion within the right seventh rib (33:128). Multilevel degenerative changes. IMPRESSION: 1. New right chest wall soft tissue lesion which may represent locally recurrent or metastatic disease. Attention on follow-up. 2. New right seventh rib sclerotic osseous lesion. 3. Splenomegaly. Electronically Signed   By: Levi Aland M.D.   On: 04/22/2023 11:03

## 2023-04-29 LAB — CANCER ANTIGEN 15-3: CA 15-3: 37.5 U/mL — ABNORMAL HIGH (ref 0.0–25.0)

## 2023-04-29 LAB — CANCER ANTIGEN 27.29: CA 27.29: 44.5 U/mL — ABNORMAL HIGH (ref 0.0–38.6)

## 2023-04-30 ENCOUNTER — Ambulatory Visit: Payer: MEDICAID | Admitting: Surgery

## 2023-04-30 NOTE — Progress Notes (Signed)
Sharon Overlie, MD sent to [name] PROCEDURE / BIOPSY REVIEW Date: 04/29/23  Requested Biopsy site: Right axillary / chest nodule Reason for request: Needs diagnosis Imaging review: CT 04/20/23  Decision: Approved Imaging modality to perform: Ultrasound Schedule with: Moderate Sedation Schedule for: Any VIR  Additional comments: Nodule along right pectoralis muscles on image 19/3.  Please contact me with questions, concerns, or if issue pertaining to this request arise.  Arn Medal, MD [facility]

## 2023-05-01 NOTE — Telephone Encounter (Signed)
Pt has accepted biopsy appt for 2/10, arrive 10a for 11a procedure.   Please schedule MD on 2/19 @ 2:45 for (bx results).   Pt's husband Mellody Dance aware about appt details. Will mail appt reminder to them once all appts are scheduled.

## 2023-05-04 ENCOUNTER — Encounter: Payer: Self-pay | Admitting: Oncology

## 2023-05-07 ENCOUNTER — Ambulatory Visit: Payer: MEDICAID | Admitting: Surgery

## 2023-05-07 NOTE — Progress Notes (Signed)
 Patient for US  guided Core RT axillary chest nodule biopsy on Monday 05/11/2023, I called and spoke with the patient on the phone and gave pre-procedure instructions. Pt was made aware to be here at 10a, NPO after MN prior to procedure as well as driver post procedure/recovery/discharge. Pt stated understanding.  Called 05/07/2023

## 2023-05-08 ENCOUNTER — Other Ambulatory Visit: Payer: Self-pay | Admitting: Radiology

## 2023-05-08 DIAGNOSIS — C50912 Malignant neoplasm of unspecified site of left female breast: Secondary | ICD-10-CM

## 2023-05-08 NOTE — H&P (Signed)
 Chief Complaint: Patient was seen in consultation today for right chest nodule   Procedure: Core biopsy of right axillary chest wall nodule  Referring Physician(s): Yu,Zhou  Supervising Physician: Elene Griffes  Patient Status: [facility] - Out-pt  History of Present Illness: Sharon Woods is a 63 y.o. female with a history of DM, HTN, HLD, thrombocytopenia, stage 4 breast lobular carcinoma with colon involvement s/p bilateral mastectomy and adjuvant radiation. CT C/A/P on 04/22/23 significant for:   IMPRESSION: 1. New right chest wall soft tissue lesion which may represent locally recurrent or metastatic disease. Attention on follow-up. 2. New right seventh rib sclerotic osseous lesion   Given concern for disease progression, patient referred to IR fo biopsy of right chest wall nodule.   Currently resting in bed with husband and son at bedside. States that the rash on her arms is very itchy, but has no other complaints. Patient is uncertain when the rash began, but son states that symptoms developed following her chemo injections about 1 week ago. She does have a history of bed bugs, but family denies anyone else in the house having a similar rash. Patient is otherwise without complaints. She has been NPO since midnight. Labs pending.   Code Status: Full code  Past Medical History:  Diagnosis Date   Anxiety    Cancer (HCC)    Depression    Diabetes mellitus without complication (HCC)    History of high cholesterol 2000   Hyperlipidemia    Hypertension    Patient denies medical problems    Thrombocytopenia (HCC)     Past Surgical History:  Procedure Laterality Date   ABDOMINAL HYSTERECTOMY     AXILLARY SENTINEL NODE BIOPSY Left 11/15/2021   Procedure: AXILLARY SENTINEL NODE BIOPSY;  Surgeon: Flynn Hylan, MD;  Location: [facility] ORS;  Service: General;  Laterality: Left;   BREAST BIOPSY Right 08/21/2021   us  bx 7:00 mass coil clip path pending   BREAST BIOPSY Right  08/21/2021   us  bx of LN, hydro marker, path pending   BREAST BIOPSY Left 08/21/2021   us  bx, heart marker, path pending   CESAREAN SECTION     x2   COLONOSCOPY WITH PROPOFOL  N/A 07/25/2022   Procedure: COLONOSCOPY WITH PROPOFOL ;  Surgeon: Luke Salaam, MD;  Location: [facility] ENDOSCOPY;  Service: Gastroenterology;  Laterality: N/A;   MASTECTOMY     MASTECTOMY MODIFIED RADICAL Right 11/15/2021   Procedure: MASTECTOMY MODIFIED RADICAL;  Surgeon: Flynn Hylan, MD;  Location: [facility] ORS;  Service: General;  Laterality: Right;   NO PAST SURGERIES     TOTAL MASTECTOMY Left 11/15/2021   Procedure: TOTAL MASTECTOMY;  Surgeon: Flynn Hylan, MD;  Location: [facility] ORS;  Service: General;  Laterality: Left;    Allergies: Tylenol  [acetaminophen ] and Aleve [naproxen]  Medications: Prior to Admission medications   Medication Sig Start Date End Date Taking? Authorizing Provider  ACCU-CHEK GUIDE test strip USE TO CHECK BLOOD SUGAR UP TO 4 TIMES DAILY AS DIRECTED 11/13/21   Timmy Forbes, MD  Accu-Chek Softclix Lancets lancets USE TO CHECK BLOOD SUAGR UP TO 4 TIMES DAILY AS DIRECTED 12/03/21   Timmy Forbes, MD  anastrozole  (ARIMIDEX ) 1 MG tablet Take 1 tablet by mouth once daily 03/26/23   Timmy Forbes, MD  blood glucose meter kit and supplies KIT Dispense based on patient and insurance preference. Use up to four times daily as directed. 09/27/21   Timmy Forbes, MD  calcium  carbonate (OS-CAL) 600 MG TABS tablet Take 2 tablets (1,200 mg total)  by mouth daily. 04/29/22   Timmy Forbes, MD  cyanocobalamin  (VITAMIN B12) 1000 MCG tablet Take 1 tablet (1,000 mcg total) by mouth daily. 10/14/22   Timmy Forbes, MD  escitalopram  (LEXAPRO ) 20 MG tablet Take 1 tablet (20 mg total) by mouth daily. 04/23/23   Rockney Cid, DO  hydrOXYzine  (ATARAX ) 10 MG tablet TAKE 1 TABLET BY MOUTH TWICE DAILY AS NEEDED FOR ITCHING OR ANXIETY 04/17/23   Rockney Cid, DO  metFORMIN  (GLUCOPHAGE ) 500 MG tablet Take 1 tablet (500 mg total) by mouth 2 (two)  times daily with a meal. 04/27/23   Rockney Cid, DO  rosuvastatin  (CRESTOR ) 5 MG tablet Take 1 tablet by mouth once daily 04/17/23   Rockney Cid, DO  triamcinolone  ointment (KENALOG ) 0.5 % Apply 1 Application topically 2 (two) times daily. 09/02/22   Timmy Forbes, MD     Family History  Problem Relation Age of Onset   Lung cancer Mother    Dementia Mother    Parkinson's disease Father    Cancer Father        unk type   Diabetes Sister    Heart attack Sister    Breast cancer Sister    Cancer Maternal Uncle        unk types   Dementia Maternal Grandmother    Cancer Maternal Grandmother        unk type   Cancer Other    Dementia Other     Social History   Socioeconomic History   Marital status: Married    Spouse name: Not on file   Number of children: Not on file   Years of education: Not on file   Highest education level: Not on file  Occupational History   Not on file  Tobacco Use   Smoking status: Former    Current packs/day: 0.00    Types: Cigarettes    Quit date: 2013    Years since quitting: 12.1    Passive exposure: Current   Smokeless tobacco: Never  Vaping Use   Vaping status: Never Used  Substance and Sexual Activity   Alcohol use: Not Currently    Comment: occasionally   Drug use: No   Sexual activity: Not Currently    Comment: unable to assess   Other Topics Concern   Not on file  Social History Narrative   Not on file   Social Drivers of Health   Financial Resource Strain: Medium Risk (10/10/2021)   Overall Financial Resource Strain (CARDIA)    Difficulty of Paying Living Expenses: Somewhat hard  Food Insecurity: No Food Insecurity (10/10/2021)   Hunger Vital Sign    Worried About Running Out of Food in the Last Year: Never true    Ran Out of Food in the Last Year: Never true  Transportation Needs: Unmet Transportation Needs (01/21/2022)   PRAPARE - Transportation    Lack of Transportation (Medical): Yes    Lack of Transportation  (Non-Medical): Yes  Physical Activity: Inactive (10/10/2021)   Exercise Vital Sign    Days of Exercise per Week: 0 days    Minutes of Exercise per Session: 0 min  Stress: Stress Concern Present (10/10/2021)   [employer] of Occupational Health - Occupational Stress Questionnaire    Feeling of Stress : To some extent  Social Connections: Socially Isolated (10/10/2021)   Social Connection and Isolation Panel [NHANES]    Frequency of Communication with Friends and Family: Once a week    Frequency of Social Gatherings with Friends and  Family: Never    Attends Religious Services: Never    Database administrator or Organizations: No    Attends Banker Meetings: Never    Marital Status: Married    Review of Systems  Skin:  Positive for rash.  Denies any N/V, chest pain, shortness of breath, fevers/chills. All other ROS negative.  Vital Signs: BP (!) 186/91   Pulse 72   Temp 97.9 F (36.6 C) (Oral)   Resp 12   Ht 5\' 1"  (1.549 m)   Wt 167 lb (75.8 kg)   LMP  (LMP Unknown) Comment: Patient is not a good historian  SpO2 97%   BMI 31.55 kg/m    Physical Exam Vitals reviewed.  Constitutional:      Appearance: Normal appearance.  HENT:     Head: Normocephalic and atraumatic.     Mouth/Throat:     Mouth: Mucous membranes are moist.     Pharynx: Oropharynx is clear.  Cardiovascular:     Rate and Rhythm: Normal rate and regular rhythm.     Heart sounds: Normal heart sounds.  Pulmonary:     Effort: Pulmonary effort is normal.     Breath sounds: Normal breath sounds.  Abdominal:     General: Abdomen is flat.     Palpations: Abdomen is soft.     Tenderness: There is no abdominal tenderness.  Musculoskeletal:        General: Normal range of motion.     Cervical back: Normal range of motion.  Skin:    General: Skin is warm and dry.     Findings: Rash (non-blanching rash to BUE, BLE, and lower abdomen) present.  Neurological:     General: No focal deficit  present.     Mental Status: She is alert and oriented to person, place, and time. Mental status is at baseline.  Psychiatric:        Mood and Affect: Mood normal.        Behavior: Behavior normal.        Judgment: Judgment normal.     Imaging: CT CHEST ABDOMEN PELVIS W CONTRAST Result Date: 04/22/2023 CLINICAL DATA:  breast cancer EXAM: CT CHEST, ABDOMEN, AND PELVIS WITH CONTRAST TECHNIQUE: Multidetector CT imaging of the chest, abdomen and pelvis was performed following the standard protocol during bolus administration of intravenous contrast. RADIATION DOSE REDUCTION: This exam was performed according to the departmental dose-optimization program which includes automated exposure control, adjustment of the mA and/or kV according to patient size and/or use of iterative reconstruction technique. CONTRAST:  OMNIPAQUE  IOHEXOL  300 MG/ML  SOLN COMPARISON:  CT chest abdomen and pelvis 08/28/2021. PET-CT 08/09/2022. FINDINGS: CT CHEST FINDINGS Cardiovascular: No significant vascular findings. Normal heart size. No pericardial effusion. Mediastinum/Nodes: No enlarged mediastinal, hilar, or axillary lymph nodes. Thyroid gland, trachea, and esophagus demonstrate no significant findings. Lungs/Pleura: Lungs are clear. No pleural effusion or pneumothorax. Musculoskeletal: Focal region of soft tissue nodularity along the lateral margin of the right pectoralis muscle measuring approximately 1.6 x 1.2 cm (19:128) which appears new from prior. Prior bilateral mastectomy. CT ABDOMEN PELVIS FINDINGS Hepatobiliary: No focal liver abnormality is seen. No gallstones, gallbladder wall thickening, or biliary dilatation. Pancreas: Unremarkable. No pancreatic ductal dilatation or surrounding inflammatory changes. Spleen: Splenomegaly measuring 17 cm cc. Adrenals/Urinary Tract: Adrenal glands are unremarkable. Kidneys are normal without focal lesion or hydronephrosis. Simple cortical cysts. Nonobstructing bilateral lower  pole calculi measuring up to 5 mm on the left. Bladder is unremarkable. Stomach/Bowel: Stomach  is within normal limits. Appendix appears normal. Colonic diverticulosis. No evidence of bowel wall thickening, distention, or inflammatory changes. Vascular/Lymphatic: No significant vascular findings are present. No enlarged abdominal or pelvic lymph nodes. Reproductive: Status post hysterectomy. No adnexal masses. Other: No abdominal wall hernia or abnormality. Trace abdominopelvic ascites. Musculoskeletal: New, sclerotic osseous lesion within the right seventh rib (33:128). Multilevel degenerative changes. IMPRESSION: 1. New right chest wall soft tissue lesion which may represent locally recurrent or metastatic disease. Attention on follow-up. 2. New right seventh rib sclerotic osseous lesion. 3. Splenomegaly. Electronically Signed   By: Rox Cope M.D.   On: 04/22/2023 11:03    Labs:  CBC: Recent Labs    01/14/23 1420 04/23/23 1601 04/28/23 1357 05/11/23 1115  WBC 3.7* 4.6 2.6* 2.9*  HGB 12.2 13.3 11.9* 12.6  HCT 36.5 40.4 36.4 37.6  PLT 66* 81* 54* 61*    COAGS: No results for input(s): "INR", "APTT" in the last 8760 hours.  BMP: Recent Labs    09/16/22 1319 10/14/22 1331 01/14/23 1420 04/23/23 1601 04/28/23 1357  NA 139 137 140 142 140  K 3.3* 3.7 3.4* 3.4* 3.9  CL 105 104 107 103 106  CO2 26 23 24 28 24   GLUCOSE 104* 203* 176* 116* 171*  BUN 18 15 13 11 18   CALCIUM  9.1 9.2 9.3 10.0 10.1  CREATININE 0.69 0.86 0.78 0.74 0.56  GFRNONAA >60 >60 >60  --  >60    LIVER FUNCTION TESTS: Recent Labs    09/16/22 1319 10/14/22 1331 01/14/23 1420 04/23/23 1601 04/28/23 1357  BILITOT 0.7 0.8 0.6 0.7 0.6  AST 22 26 25 24 24   ALT 16 16 15 16 18   ALKPHOS 61 63 56  --  56  PROT 7.1 7.5 7.3 7.7 7.2  ALBUMIN 4.0 4.1 3.9  --  3.9    TUMOR MARKERS: No results for input(s): "AFPTM", "CEA", "CA199", "CHROMGRNA" in the last 8760 hours.  Assessment and Plan:  63 y.o. female  with a history of DM, HTN, HLD, thrombocytopenia, stage 4 breast lobular carcinoma with colon involvement s/p bilateral mastectomy and adjuvant radiation. CT C/A/P on 04/22/23 significant for:   IMPRESSION: 1. New right chest wall soft tissue lesion which may represent locally recurrent or metastatic disease. Attention on follow-up. 2. New right seventh rib sclerotic osseous lesion   Given concern for disease progression, patient referred to IR fo biopsy of right chest wall nodule.   Plan for biopsy of right axillary chest wall nodule on 05/11/23 with Dr. Irine Manning  Risks and benefits of right chest wall nodule was discussed with the patient and/or patient's family including, but not limited to bleeding, infection, damage to adjacent structures or low yield requiring additional tests.  All of the questions were answered and there is agreement to proceed.  Consent signed and in chart.   Thank you for this interesting consult. I greatly enjoyed meeting Sharon Woods and look forward to participating in their care. A copy of this report was sent to the requesting provider on this date.  Electronically Signed: Eiliana Drone M Aviona Martenson, PA-C 05/11/2023, 11:44 AM   I spent a total of 30 Minutes in face to face clinical consultation, greater than 50% of which was counseling/coordinating care for right chest wall nodule biopsy.

## 2023-05-11 ENCOUNTER — Other Ambulatory Visit: Payer: Self-pay

## 2023-05-11 ENCOUNTER — Ambulatory Visit
Admission: RE | Admit: 2023-05-11 | Discharge: 2023-05-11 | Disposition: A | Discharge status: Home / Self Care | Payer: MEDICAID | Source: Ambulatory Visit | Attending: Oncology | Admitting: Oncology

## 2023-05-11 ENCOUNTER — Other Ambulatory Visit: Payer: Self-pay | Admitting: Oncology

## 2023-05-11 DIAGNOSIS — R222 Localized swelling, mass and lump, trunk: Secondary | ICD-10-CM | POA: Diagnosis present

## 2023-05-11 DIAGNOSIS — K5730 Diverticulosis of large intestine without perforation or abscess without bleeding: Secondary | ICD-10-CM | POA: Insufficient documentation

## 2023-05-11 DIAGNOSIS — I10 Essential (primary) hypertension: Secondary | ICD-10-CM | POA: Diagnosis not present

## 2023-05-11 DIAGNOSIS — Z5986 Financial insecurity: Secondary | ICD-10-CM | POA: Insufficient documentation

## 2023-05-11 DIAGNOSIS — C50912 Malignant neoplasm of unspecified site of left female breast: Secondary | ICD-10-CM

## 2023-05-11 DIAGNOSIS — Z5982 Transportation insecurity: Secondary | ICD-10-CM | POA: Insufficient documentation

## 2023-05-11 DIAGNOSIS — Z853 Personal history of malignant neoplasm of breast: Secondary | ICD-10-CM | POA: Diagnosis not present

## 2023-05-11 DIAGNOSIS — F323 Major depressive disorder, single episode, severe with psychotic features: Secondary | ICD-10-CM

## 2023-05-11 DIAGNOSIS — Z9013 Acquired absence of bilateral breasts and nipples: Secondary | ICD-10-CM | POA: Diagnosis not present

## 2023-05-11 DIAGNOSIS — Z7984 Long term (current) use of oral hypoglycemic drugs: Secondary | ICD-10-CM | POA: Diagnosis not present

## 2023-05-11 DIAGNOSIS — E119 Type 2 diabetes mellitus without complications: Secondary | ICD-10-CM | POA: Diagnosis not present

## 2023-05-11 DIAGNOSIS — Z923 Personal history of irradiation: Secondary | ICD-10-CM | POA: Insufficient documentation

## 2023-05-11 DIAGNOSIS — Z87891 Personal history of nicotine dependence: Secondary | ICD-10-CM | POA: Diagnosis not present

## 2023-05-11 DIAGNOSIS — D696 Thrombocytopenia, unspecified: Secondary | ICD-10-CM

## 2023-05-11 DIAGNOSIS — D72810 Lymphocytopenia: Secondary | ICD-10-CM

## 2023-05-11 LAB — PROTIME-INR
INR: 1.1 (ref 0.8–1.2)
Prothrombin Time: 14 s (ref 11.4–15.2)

## 2023-05-11 LAB — CBC
HCT: 37.6 % (ref 36.0–46.0)
Hemoglobin: 12.6 g/dL (ref 12.0–15.0)
MCH: 28.6 pg (ref 26.0–34.0)
MCHC: 33.5 g/dL (ref 30.0–36.0)
MCV: 85.3 fL (ref 80.0–100.0)
Platelets: 61 10*3/uL — ABNORMAL LOW (ref 150–400)
RBC: 4.41 MIL/uL (ref 3.87–5.11)
RDW: 14.9 % (ref 11.5–15.5)
WBC: 2.9 10*3/uL — ABNORMAL LOW (ref 4.0–10.5)
nRBC: 0 % (ref 0.0–0.2)

## 2023-05-11 LAB — GLUCOSE, CAPILLARY
Glucose-Capillary: 145 mg/dL — ABNORMAL HIGH (ref 70–99)
Glucose-Capillary: 112 mg/dL — ABNORMAL HIGH (ref 70–99)

## 2023-05-11 MED ORDER — MIDAZOLAM HCL 2 MG/2ML IJ SOLN
INTRAMUSCULAR | Status: AC | PRN
  Administered 2023-05-11: 1 mg via INTRAVENOUS

## 2023-05-11 MED ORDER — FENTANYL CITRATE (PF) 100 MCG/2ML IJ SOLN
INTRAMUSCULAR | Status: AC | PRN
  Administered 2023-05-11: 50 ug via INTRAVENOUS

## 2023-05-11 MED ORDER — MIDAZOLAM HCL 2 MG/2ML IJ SOLN
INTRAMUSCULAR | Status: AC
  Filled 2023-05-11: qty 2

## 2023-05-11 MED ORDER — SODIUM CHLORIDE 0.9 % IV SOLN: INTRAVENOUS | Status: DC

## 2023-05-11 MED ORDER — FENTANYL CITRATE (PF) 100 MCG/2ML IJ SOLN
INTRAMUSCULAR | Status: AC
Start: 2023-05-11 — End: 2023-05-11
  Filled 2023-05-11: qty 2

## 2023-05-11 NOTE — OR Nursing (Signed)
 Pt abdomen and arms covered with red lesions. Patient unable to express time frame for the presence of the lesions/rash. She didn't take pants for her axillary node biopsy but she reports that the lesions are on her legs too. She reports that they are itchy and my be itchier when she is under stress. She reports feeling stressed today secondary to the biopsy. She acknowledged having bedbug infestation in past. Verbalizes not sure if rash is from them. Husband and son deny rash. No visualization of any bedbugs pre, intra or post recovery.

## 2023-05-11 NOTE — Procedures (Signed)
 Interventional Radiology Procedure:   Indications: Breast cancer with new nodularity along right chest wall  Procedure: CT guided biopsy of right chest wall nodularity  Findings: The nodularity is poorly characterized with US .  Core biopsies obtained using CT guidance.    Complications: None     EBL: Minimal  Plan: Discharge in 1 hour  [name], MD  Pager: [phone]

## 2023-05-12 LAB — SURGICAL PATHOLOGY

## 2023-05-18 ENCOUNTER — Telehealth: Payer: Self-pay | Admitting: Oncology

## 2023-05-18 NOTE — Telephone Encounter (Signed)
Due to weather, appt on 05/20/23 needs to be r/s.  I left a vm for pt that the new appt is on Monday 05/25/23.

## 2023-05-20 ENCOUNTER — Ambulatory Visit: Payer: MEDICAID | Admitting: Oncology

## 2023-05-25 ENCOUNTER — Inpatient Hospital Stay: Payer: MEDICAID

## 2023-05-25 ENCOUNTER — Inpatient Hospital Stay: Payer: MEDICAID | Attending: Radiation Oncology | Admitting: Oncology

## 2023-05-25 ENCOUNTER — Encounter: Payer: Self-pay | Admitting: Oncology

## 2023-05-25 VITALS — BP 143/81 | HR 70 | Temp 97.9°F | Resp 18 | Wt 177.2 lb

## 2023-05-25 DIAGNOSIS — C50812 Malignant neoplasm of overlapping sites of left female breast: Secondary | ICD-10-CM | POA: Diagnosis present

## 2023-05-25 DIAGNOSIS — C50912 Malignant neoplasm of unspecified site of left female breast: Secondary | ICD-10-CM | POA: Diagnosis not present

## 2023-05-25 DIAGNOSIS — F323 Major depressive disorder, single episode, severe with psychotic features: Secondary | ICD-10-CM

## 2023-05-25 DIAGNOSIS — Z79899 Other long term (current) drug therapy: Secondary | ICD-10-CM | POA: Diagnosis not present

## 2023-05-25 DIAGNOSIS — C50511 Malignant neoplasm of lower-outer quadrant of right female breast: Secondary | ICD-10-CM | POA: Insufficient documentation

## 2023-05-25 DIAGNOSIS — D72810 Lymphocytopenia: Secondary | ICD-10-CM

## 2023-05-25 DIAGNOSIS — Z5111 Encounter for antineoplastic chemotherapy: Secondary | ICD-10-CM | POA: Diagnosis present

## 2023-05-25 DIAGNOSIS — D696 Thrombocytopenia, unspecified: Secondary | ICD-10-CM | POA: Diagnosis not present

## 2023-05-25 MED ORDER — FULVESTRANT 250 MG/5ML IM SOSY
500.0000 mg | PREFILLED_SYRINGE | Freq: Once | INTRAMUSCULAR | Status: AC
  Administered 2023-05-25: 500 mg via INTRAMUSCULAR
  Filled 2023-05-25: qty 10

## 2023-05-25 NOTE — Assessment & Plan Note (Signed)
 Etiology unknown.   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  A definitive morphologic etiology for this pancytopenia is not noted. Negative cytogenetics, negative myeloid FISH testing. Repeat lab work showed stable thrombocytopenia.  Continue to monitor.  Continue B12 supplementation.

## 2023-05-25 NOTE — Progress Notes (Signed)
 Hematology/Oncology Progress note Telephone:([phone] Fax:([phone]       CHIEF COMPLAINTS/REASON FOR VISIT:  bilateral breast cancer, thrombocytopenia, leukopenia  ASSESSMENT & PLAN:   Cancer Staging  Malignant neoplasm of left breast, stage 4 (HCC) Staging form: Breast, AJCC 8th Edition - Pathologic stage from 11/25/2021: Stage IB (pT2, pN1, cM0, G2, ER+, PR+, HER2-) - Signed by Rickard Patience, MD on 11/25/2021 - Pathologic stage from 07/25/2022: Stage IV (rpTX, pNX, pM1, ER+, PR: Not Assessed, HER2-) - Signed by Rickard Patience, MD on 08/14/2022  Malignant neoplasm of lower-outer quadrant of right breast of female, estrogen receptor positive (HCC) Staging form: Breast, AJCC 8th Edition - Pathologic stage from 11/25/2021: Stage IIIA (pT1c, pN3a, cM0, G2, ER+, PR+, HER2-) - Signed by Rickard Patience, MD on 11/25/2021   Malignant neoplasm of left breast, 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 [name]   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 CT-guided biopsy of the right chest wall nodule- negative for cancer.   Recommend to continue Arimidex 1mg  daily, proceed with monthly fulvestrant injection  Repeat PET scan  in 3 months.    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  Drug induced leukopenia vs other etiologies.  Continue observation.   Severe major depression, single episode, with psychotic features, mood-congruent (HCC) [name]    Thrombocytopenia (HCC) Etiology unknown.   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  A definitive morphologic etiology for this pancytopenia is not noted. Negative cytogenetics, negative myeloid FISH testing. Repeat lab work showed stable thrombocytopenia.  Continue to monitor.  Continue B12 supplementation.    Plan was discussed with patient and her husband  Orders Placed This Encounter  Procedures   NM PET Image Restage (PS) Whole Body    Standing Status:   Future    Expected Date:   08/22/2023    Expiration Date:   05/24/2024    If indicated for the ordered procedure, I authorize the administration of a radiopharmaceutical per Radiology protocol:   Yes    Preferred imaging location?:   [facility]   CBC with Differential (Cancer Center Only)    Standing Status:   Future    Expected Date:   11/22/2023    Expiration Date:   05/24/2024   CMP (Cancer Center only)    Standing Status:   Future    Expected Date:   11/22/2023    Expiration Date:   05/24/2024   Cancer antigen 15-3    Standing Status:   Future    Expected Date:   05/25/2023    Expiration Date:   05/24/2024   Cancer antigen 27.29    Standing  Status:   Future    Expected Date:   08/22/2023    Expiration Date:   05/24/2024   Follow up 3 months   [name], questions or concerns.  Rickard Patience, MD, PhD [facility] Hematology Oncology 05/25/2023     HISTORY OF PRESENTING  ILLNESS:   is a  63 y.o.  female presents for follow up of bilateral breast cancer, thrombocytopenia and leukopenia.  Oncology History  Malignant neoplasm of left breast, stage 4 (HCC)  08/06/2021 Mammogram   08/06/2021, digital bilateral mammogram and ultrasound showed left breast 3:00 mass, 1.7 x 1.7 x 1.7 cm, ultrasound of the left axillary is negative. 08/21/2021 right breast 1 x 0.7 x 0.8 cm angulated spiculated mass at the right breast 7:00, 5 cm from nipple.  Ultrasound of the right axillary demonstrates 3 abnormal thickened cortex lymph node. 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 Bary Richard right breast 7:00, invasive mammary carcinoma with lobular features, in situ carcinoma,  ER+, PR+, HER 2-This was found to be concordant by Dr. Bary Richard. right axilla lymph node +, extracapsular extension.  -This was found to be concordant by Dr. Bary Richard   08/28/2021 Initial Diagnosis   Malignant neoplasm of upper-outer quadrant of left breast in female, estrogen receptor positive (HCC)   08/28/2021 Imaging   CT CHEST, ABDOMEN, AND PELVIS WITH CONTRAST Asymmetric mildly enlarged right axillary nodes. Correlate with biopsy. There are some punctate foci of sclerosis in the included spine that could reflect subtle metastatic disease. Nonobstructing bilateral renal calculi.   08/29/2021 Imaging   BILATERAL BREAST MRI WITH AND WITHOUT CONTRAST 1. 2.5 centimeter enhancing mass in the LOWER OUTER QUADRANT of the RIGHT breast, correlating with known malignancy. 2. At least 4 enlarged RIGHT axillary lymph nodes, correlating well with recently biopsied lymph node showing metastatic disease. 3. 3.4 centimeter enhancing mass in the LEFT breast, with an additional 2.9 centimeters of non mass enhancement posterior to the mass also  suspicious for malignancy. This likely correlates with the area calcifications seen mammographically. 4. 5 millimeters satellite nodule along the LATERAL aspect of known malignancy in the LOWER OUTER QUADRANT of the LEFT breast. 5. LEFT axilla is negative for adenopathy.       09/05/2021 Imaging   NUCLEAR MEDICINE WHOLE BODY BONE SCAN Uptake at L2 which is nonspecific but may be related to advanced degenerative disc and facet disease changes at both L1-L2 and L2-L3; no evidence of osseous metastatic disease by CT. No definite osseous metastatic lesions identified.    Genetic Testing   Negative genetic testing. No pathogenic variants identified on the [facility] CancerNext-Expanded+RNA panel. The report date is 10/03/2021.  The CancerNext-Expanded + RNAinsight gene panel offered by [employer] and includes sequencing and rearrangement analysis for the following 77 genes: IP, ALK, APC*, ATM*, AXIN2, BAP1, BARD1, BLM, BMPR1A, BRCA1*, BRCA2*, BRIP1*, CDC73, CDH1*,CDK4, CDKN1B, CDKN2A, CHEK2*, CTNNA1, DICER1, FANCC, FH, FLCN, GALNT12, KIF1B, LZTR1, MAX, MEN1, MET, MLH1*, MSH2*, MSH3, MSH6*, MUTYH*, NBN, NF1*, NF2, NTHL1, PALB2*, PHOX2B, PMS2*, POT1, PRKAR1A, PTCH1, PTEN*, RAD51C*, RAD51D*,RB1, RECQL, RET, SDHA, SDHAF2, SDHB, SDHC, SDHD, SMAD4, SMARCA4, SMARCB1, SMARCE1, STK11, SUFU, TMEM127, TP53*,TSC1, TSC2, VHL and XRCC2 (sequencing and deletion/duplication); EGFR, EGLN1, HOXB13, KIT, MITF, PDGFRA, POLD1 and POLE (sequencing only); EPCAM and GREM1 (deletion/duplication  only).   11/15/2021 Surgery   She underwent left simple mastectomy and right modified mastectomy.   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  pT2 pN1a, ER +90%, PR +90%, HER2 negative (1+)  # Right Breast invasive lobular  carcinoma Grade 2, negative margins, right axillary lymph nodes  30/32 involved with metastatic carcinoma, pT1c pN3a ER +90%, PR +51-90%, HER2 negative (1+)    11/25/2021 Cancer Staging   Staging form: Breast, AJCC 8th Edition - Pathologic stage from 11/25/2021: Stage IB (pT2, pN1, cM0, G2, ER+, PR+, HER2-) - Signed by Rickard Patience, MD on 11/25/2021 Stage prefix: Initial diagnosis Multigene prognostic tests performed: None Histologic grading system: 3 grade system   01/09/2022 - 03/10/2022 Radiation Therapy   Adjuvant breast Radiation.    07/25/2022 Cancer Staging   Staging form: Breast, AJCC 8th Edition - Pathologic stage from 07/25/2022: Stage IV (rpTX, pNX, pM1, ER+, PR: Not Assessed, HER2-) - Signed by Rickard Patience, MD on 08/14/2022 Stage prefix: Recurrence Multigene prognostic tests performed: None   07/25/2022 Procedure   Colonoscopy showed Findings - Six 5 to 6 mm polyps in the ascending colon, removed with a cold snare. Resected and retrieved. - One 10 mm polyp in the descending colon, removed with a cold snare. Resected and retrieved. Clip was placed. - One 30 mm polyp in the rectum, removed with a hot snare. Resected and retrieved. Clips were placed.  Pathology showed A.  COLON POLYP X 6, ASCENDING AND CECUM; COLD SNARE: - COLONIC MUCOSA WITH METASTATIC LOBULAR BREAST CARCINOMA (3) - HYPERPLASTIC POLYP (3)  B.  COLON POLYP, DESCENDING; COLD SNARE: - TUBULAR ADENOMA. - NEGATIVE FOR HIGH-GRADE DYSPLASIA AND MALIGNANCY.  C.  RECTUM POLYP; HOT SNARE: - TUBULOVILLOUS ADENOMA. - NEGATIVE FOR HIGH-GRADE DYSPLASIA AND MALIGNANCY.  Comment: The metastatic lobular breast carcinoma is positive for GATA-3 and ER (strong staining in greater than 90% of the tumor cells). HER2 negative (1+)     08/06/2022 Imaging   PET  Status post bilateral mastectomy with radiation changes in the anterior upper lobes.   No findings suspicious for recurrent or metastatic disease   09/18/2022 Imaging    MRI brain w wo  1. No evidence of intracranial metastatic disease. 2. Mild multifocal T2 FLAIR hyperintense signal abnormality within the cerebral white matter, nonspecific but most often secondary to chronic small vessel ischemia. 3. Paranasal sinus disease as described.   01/27/2023 Imaging   [place] left axillary No sonographic evidence of malignancy at the site of palpable concern in the LEFT axilla.    04/22/2023 Imaging   CT chest abdomen pelvis w contrast showed 1. New right chest wall soft tissue lesion which may represent locally recurrent or metastatic disease. Attention on follow-up. 2. New right seventh rib sclerotic osseous lesion. 3. Splenomegaly.    05/11/2023 Procedure   CT guided biopsy of right axillary nodule   BENIGN FIBROVASCULAR TISSUE.       NO LYMPHOID TISSUE IDENTIFIED.       NEGATIVE FOR MALIGNANCY.    Malignant neoplasm of lower-outer quadrant of right breast of female, estrogen receptor positive (HCC)  08/06/2021 Mammogram   08/06/2021, digital bilateral mammogram and ultrasound showed left breast 3:00 mass, 1.7 x 1.7 x 1.7 cm, ultrasound of the left axillary is negative. 08/21/2021 right breast 1 x 0.7 x 0.8 cm angulated spiculated mass at the  right breast 7:00, 5 cm from nipple.  Ultrasound of the right axillary demonstrates 3 abnormal thickened cortex lymph node. 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 Bary Richard right breast 7:00, invasive mammary carcinoma with lobular features, in situ carcinoma,  ER+, PR+, HER 2-This was found to be concordant by Dr. Bary Richard. right axilla lymph node +, extracapsular extension.  -This was found to be concordant by Dr. Bary Richard   08/28/2021 Initial Diagnosis   Malignant neoplasm of lower-outer quadrant of right breast of female, estrogen  receptor positive (HCC)   08/28/2021 Imaging   CT CHEST, ABDOMEN, AND PELVIS WITH CONTRAST Asymmetric mildly enlarged right axillary nodes. Correlate with biopsy. There are some punctate foci of sclerosis in the included spine that could reflect subtle metastatic disease. Nonobstructing bilateral renal calculi.   08/29/2021 Imaging   BILATERAL BREAST MRI WITH AND WITHOUT CONTRAST 1. 2.5 centimeter enhancing mass in the LOWER OUTER QUADRANT of the RIGHT breast, correlating with known malignancy. 2. At least 4 enlarged RIGHT axillary lymph nodes, correlating well with recently biopsied lymph node showing metastatic disease. 3. 3.4 centimeter enhancing mass in the LEFT breast, with an additional 2.9 centimeters of non mass enhancement posterior to the mass also suspicious for malignancy. This likely correlates with the area calcifications seen mammographically. 4. 5 millimeters satellite nodule along the LATERAL aspect of known malignancy in the LOWER OUTER QUADRANT of the LEFT breast. 5. LEFT axilla is negative for adenopathy.       09/05/2021 Imaging   NUCLEAR MEDICINE WHOLE BODY BONE SCAN Uptake at L2 which is nonspecific but may be related to advanced degenerative disc and facet disease changes at both L1-L2 and L2-L3; no evidence of osseous metastatic disease by CT. No definite osseous metastatic lesions identified.   09/09/2021 Oncotype testing   [id number] block RIGHT 7:00 5 CM FN; ULTRASOUND-GUIDED BIOPSY Oncotype Dx RS score 22    Genetic Testing   Negative genetic testing. No pathogenic variants identified on the [facility] CancerNext-Expanded+RNA panel. The report date is 10/03/2021.  The CancerNext-Expanded + RNAinsight gene panel offered by [employer] and includes sequencing and rearrangement analysis for the following 77 genes: IP, ALK, APC*, ATM*, AXIN2, BAP1, BARD1, BLM, BMPR1A, BRCA1*, BRCA2*, BRIP1*, CDC73, CDH1*,CDK4, CDKN1B, CDKN2A, CHEK2*, CTNNA1, DICER1, FANCC, FH, FLCN,  GALNT12, KIF1B, LZTR1, MAX, MEN1, MET, MLH1*, MSH2*, MSH3, MSH6*, MUTYH*, NBN, NF1*, NF2, NTHL1, PALB2*, PHOX2B, PMS2*, POT1, PRKAR1A, PTCH1, PTEN*, RAD51C*, RAD51D*,RB1, RECQL, RET, SDHA, SDHAF2, SDHB, SDHC, SDHD, SMAD4, SMARCA4, SMARCB1, SMARCE1, STK11, SUFU, TMEM127, TP53*,TSC1, TSC2, VHL and XRCC2 (sequencing and deletion/duplication); EGFR, EGLN1, HOXB13, KIT, MITF, PDGFRA, POLD1 and POLE (sequencing only); EPCAM and GREM1 (deletion/duplication only).   11/15/2021 Surgery   She underwent left simple mastectomy and right modified mastectomy.   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  pT2 pN1a, ER +90%, PR +90%, HER2 negative (1+)  # Right Breast invasive lobular carcinoma Grade 2, negative margins, right axillary lymph nodes  30/32 involved with metastatic carcinoma, pT1c pN3a ER +90%, PR +51-90%, HER2 negative (1+)    11/25/2021 Cancer Staging   Staging form: Breast, AJCC 8th Edition - Pathologic stage from 11/25/2021: Stage IIIA (pT1c, pN3a, cM0, G2, ER+,  PR+, HER2-) - Signed by Rickard Patience, MD on 11/25/2021 Stage prefix: Initial diagnosis Multigene prognostic tests performed: None Histologic grading system: 3 grade system   12/10/2021 Imaging   PET scan  1. Interval bilateral mastectomy and right axillary node dissection.No evidence of residual disease in the chest wall, nodal metastases or distant metastases. 2. Focal hypermetabolic activity in the proximal rectum corresponding with an intraluminal polypoid lesion, suspicious for a villous adenoma or early colon cancer. Sigmoidoscopy/colonoscopy recommended unless recently performed   12/10/2021 Imaging   1. Interval bilateral mastectomy and right axillary node dissection.No evidence of residual disease in the chest wall,  nodal metastasesor distant metastases. 2. Focal hypermetabolic activity in the proximal rectum corresponding with an intraluminal polypoid lesion, suspicious for a villous adenoma or early colon cancer. Sigmoidoscopy/colonoscopy recommended unless recently performed    She is a poor historian. History of major depression, psychosis, previously on olanzapine and Remeron not currently on any and if not currently following up with psychiatrist. Patient's family history is positive for sister with breast cancer   INTERVAL HISTORY Sharon Woods is a 63 y.o. female who has above history reviewed by me today presents for follow up visit for management of bilateral breast cancer She has been on Lexapro since Oct 2023.  Take Arimidex 1mg  daily since Dec 2023, she reports some muscle/joint pain but overall manageable.  She tolerates Fulvestrant   Review of Systems  Constitutional:  Negative for appetite change, chills, fatigue and fever.  HENT:   Negative for hearing loss and voice change.   Eyes:  Negative for eye problems.  Respiratory:  Negative for chest tightness and cough.   Cardiovascular:  Negative for chest pain.  Gastrointestinal:  Negative for abdominal distention, abdominal pain and blood in stool.  Endocrine: Negative for hot flashes.  Genitourinary:  Negative for difficulty urinating and frequency.   Musculoskeletal:  Negative for arthralgias.  Skin:  Negative for itching and rash.  Neurological:  Negative for extremity weakness.  Hematological:  Negative for adenopathy.  Psychiatric/Behavioral:  Negative for confusion.     MEDICAL HISTORY:  Past Medical History:  Diagnosis Date   Anxiety    Cancer (HCC)    Depression    Diabetes mellitus without complication (HCC)    History of high cholesterol 2000   Hyperlipidemia    Hypertension    Patient denies medical problems    Thrombocytopenia (HCC)     SURGICAL HISTORY: Past Surgical History:  Procedure Laterality  Date   ABDOMINAL HYSTERECTOMY     AXILLARY SENTINEL NODE BIOPSY Left 11/15/2021   Procedure: AXILLARY SENTINEL NODE BIOPSY;  Surgeon: Campbell Lerner, MD;  Location: [facility] ORS;  Service: General;  Laterality: Left;   BREAST BIOPSY Right 08/21/2021   [place] bx 7:00 mass coil clip path pending   BREAST BIOPSY Right 08/21/2021   [place] bx of LN, hydro marker, path pending   BREAST BIOPSY Left 08/21/2021   [place] bx, heart marker, path pending   CESAREAN SECTION     x2   COLONOSCOPY WITH PROPOFOL N/A 07/25/2022   Procedure: COLONOSCOPY WITH PROPOFOL;  Surgeon: Wyline Mood, MD;  Location: [facility] ENDOSCOPY;  Service: Gastroenterology;  Laterality: N/A;   MASTECTOMY     MASTECTOMY MODIFIED RADICAL Right 11/15/2021   Procedure: MASTECTOMY MODIFIED RADICAL;  Surgeon: Campbell Lerner, MD;  Location: [facility] ORS;  Service: General;  Laterality: Right;   NO PAST SURGERIES     TOTAL MASTECTOMY Left 11/15/2021   Procedure: TOTAL MASTECTOMY;  Surgeon: Claudine Mouton,  Katherina Right, MD;  Location: [facility] ORS;  Service: General;  Laterality: Left;    SOCIAL HISTORY: Social History   Socioeconomic History   Marital status: Married    Spouse name: Not on file   Number of children: Not on file   Years of education: Not on file   Highest education level: Not on file  Occupational History   Not on file  Tobacco Use   Smoking status: Former    Current packs/day: 0.00    Types: Cigarettes    Quit date: 2013    Years since quitting: 12.1    Passive exposure: Current   Smokeless tobacco: Never  Vaping Use   Vaping status: Never Used  Substance and Sexual Activity   Alcohol use: Not Currently    Comment: occasionally   Drug use: No   Sexual activity: Not Currently    Comment: unable to assess   Other Topics Concern   Not on file  Social History Narrative   Not on file   Social Drivers of Health   Financial Resource Strain: Medium Risk (10/10/2021)   Overall Financial Resource Strain (CARDIA)    Difficulty of Paying  Living Expenses: Somewhat hard  Food Insecurity: No Food Insecurity (10/10/2021)   Hunger Vital Sign    Worried About Running Out of Food in the Last Year: Never true    Ran Out of Food in the Last Year: Never true  Transportation Needs: Unmet Transportation Needs (01/21/2022)   PRAPARE - Transportation    Lack of Transportation (Medical): Yes    Lack of Transportation (Non-Medical): Yes  Physical Activity: Inactive (10/10/2021)   Exercise Vital Sign    Days of Exercise per Week: 0 days    Minutes of Exercise per Session: 0 min  Stress: Stress Concern Present (10/10/2021)   [employer] of Occupational Health - Occupational Stress Questionnaire    Feeling of Stress : To some extent  Social Connections: Socially Isolated (10/10/2021)   Social Connection and Isolation Panel [NHANES]    Frequency of Communication with Friends and Family: Once a week    Frequency of Social Gatherings with Friends and Family: Never    Attends Religious Services: Never    Diplomatic Services operational officer: No    Attends Engineer, structural: Never    Marital Status: Married  Catering manager Violence: Not on file    FAMILY HISTORY: Family History  Problem Relation Age of Onset   Lung cancer Mother    Dementia Mother    Parkinson's disease Father    Cancer Father        unk type   Diabetes Sister    Heart attack Sister    Breast cancer Sister    Cancer Maternal Uncle        unk types   Dementia Maternal Grandmother    Cancer Maternal Grandmother        unk type   Cancer Other    Dementia Other     ALLERGIES:  is allergic to tylenol [acetaminophen] and aleve [naproxen].  MEDICATIONS:  Current Outpatient Medications  Medication Sig Dispense Refill   ACCU-CHEK GUIDE test strip USE TO CHECK BLOOD SUGAR UP TO 4 TIMES DAILY AS DIRECTED 100 each 0   Accu-Chek Softclix Lancets lancets USE TO CHECK BLOOD SUAGR UP TO 4 TIMES DAILY AS DIRECTED 100 each 0   anastrozole (ARIMIDEX) 1  MG tablet Take 1 tablet by mouth once daily 90 tablet 0   blood glucose  meter kit and supplies KIT Dispense based on patient and insurance preference. Use up to four times daily as directed. 1 each 1   calcium carbonate (OS-CAL) 600 MG TABS tablet Take 2 tablets (1,200 mg total) by mouth daily. 60 tablet 5   cyanocobalamin (VITAMIN B12) 1000 MCG tablet Take 1 tablet (1,000 mcg total) by mouth daily. 30 tablet 2   escitalopram (LEXAPRO) 20 MG tablet Take 1 tablet (20 mg total) by mouth daily. 90 tablet 1   hydrOXYzine (ATARAX) 10 MG tablet TAKE 1 TABLET BY MOUTH TWICE DAILY AS NEEDED FOR ITCHING OR ANXIETY 180 tablet 0   metFORMIN (GLUCOPHAGE) 500 MG tablet Take 1 tablet (500 mg total) by mouth 2 (two) times daily with a meal. 180 tablet 1   rosuvastatin (CRESTOR) 5 MG tablet Take 1 tablet by mouth once daily 90 tablet 0   triamcinolone ointment (KENALOG) 0.5 % Apply 1 Application topically 2 (two) times daily. 30 g 1   No current facility-administered medications for this visit.     PHYSICAL EXAMINATION: ECOG PERFORMANCE STATUS: 1 - Symptomatic but completely ambulatory Vitals:   05/25/23 1326  BP: (!) 143/81  Pulse: 70  Resp: 18  Temp: 97.9 F (36.6 C)    Filed Weights   05/25/23 1326  Weight: 177 lb 3.2 oz (80.4 kg)     Physical Exam Constitutional:      General: She is not in acute distress. HENT:     Head: Normocephalic and atraumatic.  Eyes:     General: No scleral icterus. Cardiovascular:     Rate and Rhythm: Normal rate.  Pulmonary:     Effort: Pulmonary effort is normal. No respiratory distress.     Breath sounds: No wheezing.  Abdominal:     General: There is no distension.  Musculoskeletal:        General: No deformity. Normal range of motion.     Cervical back: Normal range of motion and neck supple.  Skin:    Coloration: Skin is not jaundiced.  Neurological:     Mental Status: She is alert and oriented to person, place, and time. Mental status is at  baseline.     LABORATORY DATA:  I have reviewed the data as listed    Latest Ref Rng & Units 05/11/2023   11:15 AM 04/28/2023    1:57 PM 04/23/2023    4:01 PM  CBC  WBC 4.0 - 10.5 K/uL 2.9  2.6  4.6   Hemoglobin 12.0 - 15.0 g/dL 40.9  81.1  91.4   Hematocrit 36.0 - 46.0 % 37.6  36.4  40.4   Platelets 150 - 400 K/uL 61  54  81       Latest Ref Rng & Units 04/28/2023    1:57 PM 04/23/2023    4:01 PM 01/14/2023    2:20 PM  CMP  Glucose 70 - 99 mg/dL 782  956  213   BUN 8 - 23 mg/dL 18  11  13    Creatinine 0.44 - 1.00 mg/dL 0.86  5.78  4.69   Sodium 135 - 145 mmol/L 140  142  140   Potassium 3.5 - 5.1 mmol/L 3.9  3.4  3.4   Chloride 98 - 111 mmol/L 106  103  107   CO2 22 - 32 mmol/L 24  28  24    Calcium 8.9 - 10.3 mg/dL 62.9  52.8  9.3   Total Protein 6.5 - 8.1 g/dL 7.2  7.7  7.3  Total Bilirubin 0.0 - 1.2 mg/dL 0.6  0.7  0.6   Alkaline Phos 38 - 126 U/L 56   56   AST 15 - 41 U/L 24  24  25    ALT 0 - 44 U/L 18  16  15      RADIOGRAPHIC STUDIES: I have personally reviewed the radiological images as listed and agreed with the findings in the report. CT LUNG MASS BIOPSY Result Date: 05/11/2023 INDICATION: History of breast cancer. New nodularity identified along the right anterior chest wall. Tissue diagnosis is needed. EXAM: CT-GUIDED BIOPSY OF RIGHT CHEST WALL NODULE TECHNIQUE: Multidetector CT imaging of the chest was performed following the standard protocol with/without IV contrast. RADIATION DOSE REDUCTION: This exam was performed according to the departmental dose-optimization program which includes automated exposure control, adjustment of the mA and/or kV according to patient size and/or use of iterative reconstruction technique. MEDICATIONS: Moderate sedation ANESTHESIA/SEDATION: Moderate (conscious) sedation was employed during this procedure. A total of Versed 1 mg and Fentanyl 50 mcg was administered intravenously by the radiology nurse. Total intra-service moderate Sedation  Time: 13 minutes. The patient's level of consciousness and vital signs were monitored continuously by radiology nursing throughout the procedure under my direct supervision. COMPLICATIONS: None immediate. PROCEDURE: The area of concern was initially evaluated with ultrasound. Nodularity was poorly characterized on ultrasound compared to the prior CT imaging. Therefore, the procedure was performed using CT guidance. Informed written consent was obtained from the patient after a thorough discussion of the procedural risks, benefits and alternatives. All questions were addressed. A timeout was performed prior to the initiation of the procedure. CT images through the chest were obtained. The area of nodularity in the lateral right anterior chest was targeted. Right side of the anterior chest was prepped with chlorhexidine and sterile field was created. Skin was anesthetized with 1% lidocaine. A small incision was made. Using CT guidance, 17 gauge coaxial needle was directed to the medial aspect of the nodular lesion. Multiple core biopsies were obtained with an 18 gauge device. Specimens placed in formalin. Needle was removed without complication. Bandage placed over the puncture site. FINDINGS: Poorly defined nodularity along the lateral aspect of the right pectoralis musculature. Findings are similar to the prior CT from 04/20/2023. Needle was positioned along the medial aspect of the nodularity. Small core samples were obtained. IMPRESSION: CT-guided core biopsy of the nodularity along the anterolateral right chest wall. Electronically Signed   By: Richarda Overlie M.D.   On: 05/11/2023 15:36   CT CHEST ABDOMEN PELVIS W CONTRAST Result Date: 04/22/2023 CLINICAL DATA:  breast cancer EXAM: CT CHEST, ABDOMEN, AND PELVIS WITH CONTRAST TECHNIQUE: Multidetector CT imaging of the chest, abdomen and pelvis was performed following the standard protocol during bolus administration of intravenous contrast. RADIATION DOSE  REDUCTION: This exam was performed according to the departmental dose-optimization program which includes automated exposure control, adjustment of the mA and/or kV according to patient size and/or use of iterative reconstruction technique. CONTRAST:  OMNIPAQUE IOHEXOL 300 MG/ML  SOLN COMPARISON:  CT chest abdomen and pelvis 08/28/2021. PET-CT 08/09/2022. FINDINGS: CT CHEST FINDINGS Cardiovascular: No significant vascular findings. Normal heart size. No pericardial effusion. Mediastinum/Nodes: No enlarged mediastinal, hilar, or axillary lymph nodes. Thyroid gland, trachea, and esophagus demonstrate no significant findings. Lungs/Pleura: Lungs are clear. No pleural effusion or pneumothorax. Musculoskeletal: Focal region of soft tissue nodularity along the lateral margin of the right pectoralis muscle measuring approximately 1.6 x 1.2 cm (19:128) which appears new from prior. Prior  bilateral mastectomy. CT ABDOMEN PELVIS FINDINGS Hepatobiliary: No focal liver abnormality is seen. No gallstones, gallbladder wall thickening, or biliary dilatation. Pancreas: Unremarkable. No pancreatic ductal dilatation or surrounding inflammatory changes. Spleen: Splenomegaly measuring 17 cm cc. Adrenals/Urinary Tract: Adrenal glands are unremarkable. Kidneys are normal without focal lesion or hydronephrosis. Simple cortical cysts. Nonobstructing bilateral lower pole calculi measuring up to 5 mm on the left. Bladder is unremarkable. Stomach/Bowel: Stomach is within normal limits. Appendix appears normal. Colonic diverticulosis. No evidence of bowel wall thickening, distention, or inflammatory changes. Vascular/Lymphatic: No significant vascular findings are present. No enlarged abdominal or pelvic lymph nodes. Reproductive: Status post hysterectomy. No adnexal masses. Other: No abdominal wall hernia or abnormality. Trace abdominopelvic ascites. Musculoskeletal: New, sclerotic osseous lesion within the right seventh rib (33:128).  Multilevel degenerative changes. IMPRESSION: 1. New right chest wall soft tissue lesion which may represent locally recurrent or metastatic disease. Attention on follow-up. 2. New right seventh rib sclerotic osseous lesion. 3. Splenomegaly. Electronically Signed   By: Levi Aland M.D.   On: 04/22/2023 11:03

## 2023-05-25 NOTE — Assessment & Plan Note (Signed)
 [name]

## 2023-05-25 NOTE — Assessment & Plan Note (Signed)
 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  Drug induced leukopenia vs other etiologies.  Continue observation.

## 2023-05-25 NOTE — Assessment & Plan Note (Addendum)
 Bilateral breast invasive lobular carcinoma, s/p bilateral mastectomy. [name]   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 CT-guided biopsy of the right chest wall nodule- negative for cancer.   Recommend to continue Arimidex 1mg  daily, proceed with monthly fulvestrant injection  Repeat PET scan in 3 months.

## 2023-05-28 ENCOUNTER — Other Ambulatory Visit: Payer: Self-pay

## 2023-05-28 ENCOUNTER — Emergency Department: Payer: MEDICAID

## 2023-05-28 ENCOUNTER — Emergency Department
Admission: EM | Admit: 2023-05-28 | Discharge: 2023-05-28 | Disposition: A | Discharge status: Home / Self Care | Payer: MEDICAID | Attending: Emergency Medicine | Admitting: Emergency Medicine

## 2023-05-28 DIAGNOSIS — R0781 Pleurodynia: Secondary | ICD-10-CM | POA: Insufficient documentation

## 2023-05-28 DIAGNOSIS — R109 Unspecified abdominal pain: Secondary | ICD-10-CM | POA: Diagnosis not present

## 2023-05-28 DIAGNOSIS — E119 Type 2 diabetes mellitus without complications: Secondary | ICD-10-CM | POA: Diagnosis not present

## 2023-05-28 DIAGNOSIS — R0789 Other chest pain: Secondary | ICD-10-CM | POA: Insufficient documentation

## 2023-05-28 DIAGNOSIS — Z853 Personal history of malignant neoplasm of breast: Secondary | ICD-10-CM | POA: Diagnosis not present

## 2023-05-28 DIAGNOSIS — R079 Chest pain, unspecified: Secondary | ICD-10-CM | POA: Diagnosis present

## 2023-05-28 LAB — CBC WITH DIFFERENTIAL/PLATELET
Abs Immature Granulocytes: 0 10*3/uL (ref 0.00–0.07)
Basophils Absolute: 0 10*3/uL (ref 0.0–0.1)
Basophils Relative: 0 %
Eosinophils Absolute: 0 10*3/uL (ref 0.0–0.5)
Eosinophils Relative: 1 %
HCT: 35.7 % — ABNORMAL LOW (ref 36.0–46.0)
Hemoglobin: 11.8 g/dL — ABNORMAL LOW (ref 12.0–15.0)
Immature Granulocytes: 0 %
Lymphocytes Relative: 21 %
Lymphs Abs: 0.5 10*3/uL — ABNORMAL LOW (ref 0.7–4.0)
MCH: 29.1 pg (ref 26.0–34.0)
MCHC: 33.1 g/dL (ref 30.0–36.0)
MCV: 87.9 fL (ref 80.0–100.0)
Monocytes Absolute: 0.2 10*3/uL (ref 0.1–1.0)
Monocytes Relative: 7 %
Neutro Abs: 1.7 10*3/uL (ref 1.7–7.7)
Neutrophils Relative %: 71 %
Platelets: 53 10*3/uL — ABNORMAL LOW (ref 150–400)
RBC: 4.06 MIL/uL (ref 3.87–5.11)
RDW: 14.7 % (ref 11.5–15.5)
Smear Review: NORMAL
WBC: 2.4 10*3/uL — ABNORMAL LOW (ref 4.0–10.5)
nRBC: 0 % (ref 0.0–0.2)

## 2023-05-28 LAB — COMPREHENSIVE METABOLIC PANEL
ALT: 16 U/L (ref 0–44)
AST: 24 U/L (ref 15–41)
Albumin: 3.8 g/dL (ref 3.5–5.0)
Alkaline Phosphatase: 60 U/L (ref 38–126)
Anion gap: 10 (ref 5–15)
BUN: 14 mg/dL (ref 8–23)
CO2: 24 mmol/L (ref 22–32)
Calcium: 9 mg/dL (ref 8.9–10.3)
Chloride: 108 mmol/L (ref 98–111)
Creatinine, Ser: 0.67 mg/dL (ref 0.44–1.00)
GFR, Estimated: >60 mL/min (ref >60)
Glucose, Bld: 186 mg/dL — ABNORMAL HIGH (ref 70–99)
Potassium: 3.1 mmol/L — ABNORMAL LOW (ref 3.5–5.1)
Sodium: 142 mmol/L (ref 135–145)
Total Bilirubin: 0.6 mg/dL (ref 0.0–1.2)
Total Protein: 6.9 g/dL (ref 6.5–8.1)

## 2023-05-28 LAB — TROPONIN I (HIGH SENSITIVITY)
Troponin I (High Sensitivity): 3 ng/L (ref <18)
Troponin I (High Sensitivity): 3 ng/L (ref <18)

## 2023-05-28 MED ORDER — IOHEXOL 350 MG/ML SOLN
100.0000 mL | Freq: Once | INTRAVENOUS | Status: AC | PRN
  Administered 2023-05-28: 100 mL via INTRAVENOUS

## 2023-05-28 MED ORDER — MORPHINE SULFATE (PF) 4 MG/ML IV SOLN
4.0000 mg | Freq: Once | INTRAVENOUS | Status: AC
  Administered 2023-05-28: 4 mg via INTRAVENOUS
  Filled 2023-05-28: qty 1

## 2023-05-28 NOTE — ED Triage Notes (Signed)
 Pt to ED via POV c/o left sided lower rib pain that started on feb 25th and has been getting progressively worse. Radiates to left side of back. Denies CP, shob, fevers, dizziness. Hx of stage 4 colon cancer.

## 2023-05-28 NOTE — ED Provider Notes (Signed)
 [facility] Provider Note    Event Date/Time   First MD Initiated Contact with Patient 05/28/23 2145     (approximate)   History   Chest Pain   HPI Sharon Woods is a 63 y.o. female with history of depression, breast cancer, DM2, chronic hypokalemia, HLD presenting today for left flank pain.  Patient states she has had pain in the left lower side of her rib cage that started approximately 2 days ago and has been progressive and constant.  She has mild pain when she takes a deep breath.  Otherwise denies chest pain elsewhere.  No other abdominal pain.  Denies nausea, vomiting, diarrhea.  No fevers, cough, congestion.  Does not take any pain medicine at home for chronic cancer symptoms.  No prior history of blood clots.  Denies any falls.  Chart review: Reviewed patient's most recent oncology note from 3 days ago.  She has known malignant neoplasm of the left breast and reportedly poor insight on her condition.  Not a candidate for IV chemotherapy.  Has received radiation in the past.  Most recent CT scan showed concern for new right sided chest wall nodule as well as right rib lesion concerning for progression of the disease.     Physical Exam   Triage Vital Signs: ED Triage Vitals  Encounter Vitals Group     BP 05/28/23 2007 (!) 147/82     Systolic BP Percentile --      Diastolic BP Percentile --      Pulse Rate 05/28/23 2007 76     Resp 05/28/23 2007 18     Temp 05/28/23 2007 98.2 F (36.8 C)     Temp Source 05/28/23 2007 Oral     SpO2 05/28/23 2007 98 %     Weight 05/28/23 2007 177 lb (80.3 kg)     Height 05/28/23 2007 5\' 1"  (1.549 m)     Head Circumference --      Peak Flow --      Pain Score 05/28/23 2018 8     Pain Loc --      Pain Education --      Exclude from Growth Chart --     Most recent vital signs: Vitals:   05/28/23 2007  BP: (!) 147/82  Pulse: 76  Resp: 18  Temp: 98.2 F (36.8 C)  SpO2: 98%   Physical Exam: I have  reviewed the vital signs and nursing notes. General: Awake, alert, no acute distress.  Nontoxic appearing. Head:  Atraumatic, normocephalic.   ENT:  EOM intact, PERRL. Oral mucosa is pink and moist with no lesions. Neck: Neck is supple with full range of motion, No meningeal signs. Cardiovascular:  RRR, No murmurs. Peripheral pulses palpable and equal bilaterally. Chest wall: Mild tenderness palpation over lower lateral left chest wall without obvious deformity or erythema Respiratory:  Symmetrical chest wall expansion.  No rhonchi, rales, or wheezes.  Good air movement throughout.  No use of accessory muscles.   Musculoskeletal:  No cyanosis or edema. Moving extremities with full ROM Abdomen:  Soft, nontender, nondistended. Neuro:  GCS 15, moving all four extremities, interacting appropriately. Speech clear. Psych:  Calm, appropriate.   Skin:  Warm, dry, no rash.    ED Results / Procedures / Treatments   Labs (all labs ordered are listed, but only abnormal results are displayed) Labs Reviewed  COMPREHENSIVE METABOLIC PANEL - Abnormal; Notable for the following components:      Result Value  Potassium 3.1 (*)    Glucose, Bld 186 (*)    All other components within normal limits  CBC WITH DIFFERENTIAL/PLATELET - Abnormal; Notable for the following components:   WBC 2.4 (*)    Hemoglobin 11.8 (*)    HCT 35.7 (*)    Platelets 53 (*)    Lymphs Abs 0.5 (*)    All other components within normal limits  TROPONIN I (HIGH SENSITIVITY)  TROPONIN I (HIGH SENSITIVITY)     EKG My EKG interpretation: Rate of 74, normal sinus rhythm, normal axis, normal intervals.  No acute ST elevations or depressions   RADIOLOGY Independently interpreted chest x-ray and CTA chest/abdomen/pelvis.  No acute new abnormalities.  Chronic changes seen related to the cancer but no other infections   PROCEDURES:  Critical Care performed: No  Procedures   MEDICATIONS ORDERED IN ED: Medications   iohexol (OMNIPAQUE) 350 MG/ML injection 100 mL (100 mLs Intravenous Contrast Given 05/28/23 2124)  morphine (PF) 4 MG/ML injection 4 mg (4 mg Intravenous Given 05/28/23 2229)     IMPRESSION / MDM / ASSESSMENT AND PLAN / ED COURSE  I reviewed the triage vital signs and the nursing notes.                              Differential diagnosis includes, but is not limited to, rib fracture, costochondritis, metastatic cancer, chronic cancer pain symptoms, gastritis, intra-abdominal infection  Patient's presentation is most consistent with acute complicated illness / injury requiring diagnostic workup.  Patient is a 63 year old female presenting today for left chest wall pain with point tenderness palpation at the site.  No other complaints today.  Vital signs stable and physical exam otherwise unremarkable.  Laboratory workup shows her chronic leukopenia and thrombocytopenia but no other acute changes and stable with her baseline.  CMP shows her chronic hypokalemia but otherwise unremarkable.  Troponins negative x 2 and EKG without signs of ischemia.  CT imaging of her chest and abdomen/pelvis were ordered in triage.  Patient was given morphine for pain symptoms.  CT imaging shows no acute pathology.  We see her chronic findings but no new changes.  Patient was improved on reassessment.  Safe for discharge and follow-up with her PCP and oncology teams.  The patient is on the cardiac monitor to evaluate for evidence of arrhythmia and/or significant heart rate changes. Clinical Course as of 05/28/23 2315  Thu May 28, 2023  2308 Reassessed with improvement in pain symptoms.  Will discharge [DW]    Clinical Course User Index [DW] Janith Lima, MD     FINAL CLINICAL IMPRESSION(S) / ED DIAGNOSES   Final diagnoses:  Left-sided chest wall pain     Rx / DC Orders   ED Discharge Orders     None        Note:  This document was prepared using Dragon voice recognition software and may  include unintentional dictation errors.   Janith Lima, MD 05/28/23 [phone]

## 2023-05-28 NOTE — ED Provider Triage Note (Signed)
 Emergency Medicine Provider Triage Evaluation Note  ISABELE LOLLAR , a 63 y.o. female  was evaluated in triage.  Pt complains of Left-sided rib pain for the past few days after recent surgery.  no fevers or chills.  Review of Systems  Positive:  Negative:   Physical Exam  BP (!) 147/82 (BP Location: Left Arm)   Pulse 76   Temp 98.2 F (36.8 C) (Oral)   Resp 18   Ht 5\' 1"  (1.549 m)   Wt 80.3 kg   LMP  (LMP Unknown) Comment: Patient is not a good historian  SpO2 98%   BMI 33.44 kg/m  Gen:   Awake, no distress   Resp:  Normal effort  MSK:   Moves extremities without difficulty  Other:    Medical Decision Making  Medically screening exam initiated at 8:13 PM.  Appropriate orders placed.  LAURAL EILAND was informed that the remainder of the evaluation will be completed by another provider, this initial triage assessment does not replace that evaluation, and the importance of remaining in the ED until their evaluation is complete.  Left side, flank pain postop.   Christen Bame, [place] 05/28/23 2016

## 2023-05-28 NOTE — Discharge Instructions (Signed)
 CT imaging of your chest/abdomen/pelvis was all reassuring today.  Please follow-up with your oncology team and primary care provider.

## 2023-06-22 ENCOUNTER — Inpatient Hospital Stay: Payer: MEDICAID | Attending: Radiation Oncology

## 2023-06-22 DIAGNOSIS — C50812 Malignant neoplasm of overlapping sites of left female breast: Secondary | ICD-10-CM | POA: Diagnosis present

## 2023-06-22 DIAGNOSIS — C50511 Malignant neoplasm of lower-outer quadrant of right female breast: Secondary | ICD-10-CM | POA: Insufficient documentation

## 2023-06-22 DIAGNOSIS — Z5111 Encounter for antineoplastic chemotherapy: Secondary | ICD-10-CM | POA: Diagnosis present

## 2023-06-22 DIAGNOSIS — C50912 Malignant neoplasm of unspecified site of left female breast: Secondary | ICD-10-CM

## 2023-06-22 DIAGNOSIS — Z79899 Other long term (current) drug therapy: Secondary | ICD-10-CM | POA: Diagnosis not present

## 2023-06-22 DIAGNOSIS — Z170 Estrogen receptor positive status [ER+]: Secondary | ICD-10-CM | POA: Insufficient documentation

## 2023-06-22 MED ORDER — FULVESTRANT 250 MG/5ML IM SOSY
500.0000 mg | PREFILLED_SYRINGE | Freq: Once | INTRAMUSCULAR | Status: AC
  Administered 2023-06-22: 500 mg via INTRAMUSCULAR
  Filled 2023-06-22: qty 10

## 2023-07-04 ENCOUNTER — Other Ambulatory Visit: Payer: Self-pay | Admitting: Internal Medicine

## 2023-07-04 DIAGNOSIS — E785 Hyperlipidemia, unspecified: Secondary | ICD-10-CM

## 2023-07-06 NOTE — Telephone Encounter (Signed)
 Requested Prescriptions  Pending Prescriptions Disp Refills   rosuvastatin (CRESTOR) 5 MG tablet [Pharmacy Med Name: Rosuvastatin Calcium 5 MG Oral Tablet] 90 tablet 1    Sig: Take 1 tablet by mouth once daily     Cardiovascular:  Antilipid - Statins 2 Failed - 07/06/2023  4:05 PM      Failed - Valid encounter within last 12 months    Recent Outpatient Visits   None     Future Appointments             In 2 weeks Margarita Mail, DO [place]            Failed - Lipid Panel in normal range within the last 12 months    Cholesterol  Date Value Ref Range Status  04/23/2023 177 <200 mg/dL Final   LDL Cholesterol (Calc)  Date Value Ref Range Status  04/23/2023 90 mg/dL (calc) Final    Comment:    Reference range: <100 . Desirable range <100 mg/dL for primary prevention;   <70 mg/dL for patients with CHD or diabetic patients  with > or = 2 CHD risk factors. Marland Kitchen LDL-C is now calculated using the Martin-Hopkins  calculation, which is a validated novel method providing  better accuracy than the Friedewald equation in the  estimation of LDL-C.  Horald Pollen et al. Lenox Ahr. 4098;119(14): 2061-2068  (http://education.[employer].com/faq/FAQ164)    HDL  Date Value Ref Range Status  04/23/2023 62 > OR = 50 mg/dL Final   Triglycerides  Date Value Ref Range Status  04/23/2023 148 <150 mg/dL Final         Passed - Cr in normal range and within 360 days    Creatinine  Date Value Ref Range Status  04/28/2023 0.56 0.44 - 1.00 mg/dL Final   Creat  Date Value Ref Range Status  04/23/2023 0.74 0.50 - 1.05 mg/dL Final   Creatinine, Ser  Date Value Ref Range Status  05/28/2023 0.67 0.44 - 1.00 mg/dL Final   Creatinine, Urine  Date Value Ref Range Status  04/23/2023 176 20 - 275 mg/dL Final         Passed - Patient is not pregnant

## 2023-07-23 ENCOUNTER — Encounter: Payer: Self-pay | Admitting: Internal Medicine

## 2023-07-23 ENCOUNTER — Other Ambulatory Visit: Payer: Self-pay

## 2023-07-23 ENCOUNTER — Other Ambulatory Visit (HOSPITAL_COMMUNITY)
Admission: RE | Admit: 2023-07-23 | Discharge: 2023-07-23 | Disposition: A | Discharge status: Home / Self Care | Payer: MEDICAID | Source: Ambulatory Visit | Attending: Internal Medicine | Admitting: Internal Medicine

## 2023-07-23 ENCOUNTER — Ambulatory Visit: Payer: MEDICAID | Admitting: Internal Medicine

## 2023-07-23 VITALS — BP 124/82 | HR 71 | Temp 97.8°F | Resp 16 | Ht 64.0 in | Wt 175.4 lb

## 2023-07-23 DIAGNOSIS — N949 Unspecified condition associated with female genital organs and menstrual cycle: Secondary | ICD-10-CM | POA: Insufficient documentation

## 2023-07-23 DIAGNOSIS — Z7984 Long term (current) use of oral hypoglycemic drugs: Secondary | ICD-10-CM

## 2023-07-23 DIAGNOSIS — R21 Rash and other nonspecific skin eruption: Secondary | ICD-10-CM

## 2023-07-23 DIAGNOSIS — K219 Gastro-esophageal reflux disease without esophagitis: Secondary | ICD-10-CM | POA: Diagnosis not present

## 2023-07-23 DIAGNOSIS — N3090 Cystitis, unspecified without hematuria: Secondary | ICD-10-CM

## 2023-07-23 DIAGNOSIS — E1169 Type 2 diabetes mellitus with other specified complication: Secondary | ICD-10-CM | POA: Diagnosis not present

## 2023-07-23 LAB — POCT URINALYSIS DIPSTICK
Bilirubin, UA: NEGATIVE
Glucose, UA: NEGATIVE
Ketones, UA: NEGATIVE
Nitrite, UA: POSITIVE
Protein, UA: POSITIVE — AB
Spec Grav, UA: 1.02 (ref 1.010–1.025)
Urobilinogen, UA: 0.2 U/dL
pH, UA: 6 (ref 5.0–8.0)

## 2023-07-23 LAB — POCT GLYCOSYLATED HEMOGLOBIN (HGB A1C): Hemoglobin A1C: 6.6 % — AB (ref 4.0–5.6)

## 2023-07-23 MED ORDER — CEFTRIAXONE SODIUM 500 MG IJ SOLR: 500.0000 mg | Freq: Once | INTRAMUSCULAR | Status: DC

## 2023-07-23 MED ORDER — CEFTRIAXONE SODIUM 500 MG IJ SOLR
500.0000 mg | Freq: Once | INTRAMUSCULAR | Status: AC
  Administered 2023-07-23 (×2): 500 mg via INTRAMUSCULAR

## 2023-07-23 MED ORDER — FAMOTIDINE 40 MG PO TABS
40.0000 mg | ORAL_TABLET | Freq: Every day | ORAL | 1 refills | Status: DC
Start: 2023-07-23 — End: 2023-11-20

## 2023-07-23 MED ORDER — SULFAMETHOXAZOLE-TRIMETHOPRIM 800-160 MG PO TABS
1.0000 | ORAL_TABLET | Freq: Two times a day (BID) | ORAL | 0 refills | Status: AC
Start: 2023-07-23 — End: 2023-07-26

## 2023-07-23 NOTE — Progress Notes (Signed)
 Established Patient Office Visit  Subjective   Patient ID: Sharon Woods, female    DOB: 01-03-1961  Age: 63 y.o. MRN: [id number]  Chief Complaint  Patient presents with   Medical Management of Chronic Issues    3 month recheck    HPI  Sharon Woods presents to follow-up on chronic medical conditions.   Discussed the use of AI scribe software for clinical note transcription with the patient, who gave verbal consent to proceed.  History of Present Illness The patient, with a history of cancer and diabetes, presents with multiple complaints. The primary concern is a rash that has been persistent despite attempts to manage it with over-the-counter allergy medication. The rash is widespread and appears to be linked to the patient's medication intake.  The patient also reports urinary symptoms, including changes in urine color and the presence of blood. The patient describes the urine as turning from a dark yellow to orange and then red. The patient also reports seeing blood on toilet paper after urination. The patient denies any burning sensation during urination but reports occasional sharp pains in the urethral area.  In addition to the rash and urinary symptoms, the patient also reports acid reflux. The patient has been managing this with Tums, but reports that it only provides temporary relief. The patient also reports feeling tired and lethargic frequently.  The patient's diabetes appears to be well-managed, with a recent A1c of 6.6. The patient is currently on metformin  for diabetes management. The patient also has a history of cancer and is currently on Arimidex .  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, CT scan yesterday now with new right chest wall soft tissue lesion which may represent locally recurrent or metastatic disease as well as new right seventh rib sclerotic osseous lesion -S/p Double mastectomy in August 2023 -Following with medical  oncology -Continues to be on Arimidex  1 mg, now also on Fulvestrant  injections -Family history of mother, father and sister with breast cancer -MRI brain negative for brains mets, but with small vessel ischemia   Diabetes, Type 2: -Last A1c 6.1% 7/24 -Medications: Metformin  500 mg BID -Patient is compliant with the above medications and reports no side effects.  -Eye exam: UTD -Foot exam: UTD -Microalbumin: UTD -Statin: Yes -PNA vaccine: Patient willing to have vaccine in the fall -Denies symptoms of hypoglycemia, polyuria, polydipsia, numbness extremities, foot ulcers/trauma.   HLD: -Medications: Crestor  5 mg  -Patient is compliant with medication reports no side effects -MRI of the brain showing small vessel ischemia 6/24 -Last lipid panel: Lipid Panel     Component Value Date/Time   CHOL 177 04/23/2023 1601   TRIG 148 04/23/2023 1601   HDL 62 04/23/2023 1601   CHOLHDL 2.9 04/23/2023 1601   VLDL 47 (H) 04/16/2015 0659   LDLCALC 90 04/23/2023 1601    MDD: -Mood status: worse -Current treatment: Lexapro  20 mg, Hydroxyzine  10 mg daily   -Satisfied with current treatment?: no -Symptom severity: moderate  -Duration of current treatment : months -Side effects: no Medication compliance: excellent compliance -Previous psychiatric medications: prozac , seroquel, and zyprexa  -patient states that she has been on medications for depression in the past, however her "husband did not want her to be on them" and she felt like a "zombie" so she discontinued them however she also states that she is under a lot of stress both at home and with her health Depressed mood: yes     04/23/2023    3:19  PM 10/20/2022   10:29 AM 07/21/2022   10:34 AM 05/09/2022   12:56 PM 04/15/2022   11:27 AM  Depression screen PHQ 2/9  Decreased Interest 0 0 0 0 2  Down, Depressed, Hopeless 0 0 0 0 1  PHQ - 2 Score 0 0 0 0 3  Altered sleeping  0 0  0  Tired, decreased energy  0 0  2  Change in appetite  0 0   0  Feeling bad or failure about yourself   0 0  0  Trouble concentrating  0 0  0  Moving slowly or fidgety/restless  0 0  0  Suicidal thoughts  0 0  0  PHQ-9 Score  0 0  5  Difficult doing work/chores  Not difficult at all Not difficult at all  Not difficult at all      Patient Active Problem List   Diagnosis Date Noted   Mixed hyperlipidemia 04/23/2023   Moderate episode of recurrent major depressive disorder (HCC) 04/23/2023   Abnormal CT scan 07/25/2022   Adenomatous polyp of colon 07/25/2022   Hypokalemia 06/24/2022   Anxiety 05/25/2022   Leukopenia 03/20/2022   Blood in stool 03/13/2022   Abnormal gastrointestinal PET scan 01/10/2022   Diabetes mellitus (HCC) 12/05/2021   Status post bilateral mastectomy 11/21/2021   Genetic testing 10/07/2021   Vaginitis 09/28/2021   Thrombocytopenia (HCC) 09/08/2021   Malignant neoplasm of left breast, stage 4 (HCC) 08/28/2021   Malignant neoplasm of lower-outer quadrant of right breast of female, estrogen receptor positive (HCC) 08/28/2021   Goals of care, counseling/discussion 08/28/2021   Severe recurrent major depression with psychotic features (HCC) 11/15/2015   Hypertension 11/15/2015   Noncompliance 11/15/2015   Severe major depression, single episode, with psychotic features, mood-congruent (HCC) 03/29/2015   Protein-calorie malnutrition, severe 03/26/2015   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   Past Medical History:  Diagnosis Date   Anxiety    Cancer (HCC)    Depression    Diabetes mellitus without complication (HCC)    History of high cholesterol 2000   Hyperlipidemia    Hypertension    Patient denies medical problems    Thrombocytopenia (HCC)    Past Surgical History:  Procedure Laterality Date   ABDOMINAL HYSTERECTOMY     AXILLARY SENTINEL NODE BIOPSY Left 11/15/2021   Procedure: AXILLARY SENTINEL NODE BIOPSY;  Surgeon: Flynn Hylan, MD;  Location: [facility] ORS;  Service: General;  Laterality: Left;   BREAST BIOPSY Right  08/21/2021   us  bx 7:00 mass coil clip path pending   BREAST BIOPSY Right 08/21/2021   us  bx of LN, hydro marker, path pending   BREAST BIOPSY Left 08/21/2021   us  bx, heart marker, path pending   CESAREAN SECTION     x2   COLONOSCOPY WITH PROPOFOL  N/A 07/25/2022   Procedure: COLONOSCOPY WITH PROPOFOL ;  Surgeon: Luke Salaam, MD;  Location: [facility] ENDOSCOPY;  Service: Gastroenterology;  Laterality: N/A;   MASTECTOMY     MASTECTOMY MODIFIED RADICAL Right 11/15/2021   Procedure: MASTECTOMY MODIFIED RADICAL;  Surgeon: Flynn Hylan, MD;  Location: [facility] ORS;  Service: General;  Laterality: Right;   NO PAST SURGERIES     TOTAL MASTECTOMY Left 11/15/2021   Procedure: TOTAL MASTECTOMY;  Surgeon: Flynn Hylan, MD;  Location: [facility] ORS;  Service: General;  Laterality: Left;   Social History   Tobacco Use   Smoking status: Former    Current packs/day: 0.00    Types: Cigarettes    Quit date: 2013  Years since quitting: 12.3    Passive exposure: Current   Smokeless tobacco: Never  Vaping Use   Vaping status: Never Used  Substance Use Topics   Alcohol use: Not Currently    Comment: occasionally   Drug use: No   Social History   Socioeconomic History   Marital status: Married    Spouse name: Not on file   Number of children: Not on file   Years of education: Not on file   Highest education level: Not on file  Occupational History   Not on file  Tobacco Use   Smoking status: Former    Current packs/day: 0.00    Types: Cigarettes    Quit date: 2013    Years since quitting: 12.3    Passive exposure: Current   Smokeless tobacco: Never  Vaping Use   Vaping status: Never Used  Substance and Sexual Activity   Alcohol use: Not Currently    Comment: occasionally   Drug use: No   Sexual activity: Not Currently    Comment: unable to assess   Other Topics Concern   Not on file  Social History Narrative   Not on file   Social Drivers of Health   Financial Resource Strain:  Medium Risk (10/10/2021)   Overall Financial Resource Strain (CARDIA)    Difficulty of Paying Living Expenses: Somewhat hard  Food Insecurity: No Food Insecurity (10/10/2021)   Hunger Vital Sign    Worried About Running Out of Food in the Last Year: Never true    Ran Out of Food in the Last Year: Never true  Transportation Needs: Unmet Transportation Needs (01/21/2022)   PRAPARE - Transportation    Lack of Transportation (Medical): Yes    Lack of Transportation (Non-Medical): Yes  Physical Activity: Inactive (10/10/2021)   Exercise Vital Sign    Days of Exercise per Week: 0 days    Minutes of Exercise per Session: 0 min  Stress: Stress Concern Present (10/10/2021)   [employer] of Occupational Health - Occupational Stress Questionnaire    Feeling of Stress : To some extent  Social Connections: Socially Isolated (10/10/2021)   Social Connection and Isolation Panel [NHANES]    Frequency of Communication with Friends and Family: Once a week    Frequency of Social Gatherings with Friends and Family: Never    Attends Religious Services: Never    Diplomatic Services operational officer: No    Attends Engineer, structural: Never    Marital Status: Married  Catering manager Violence: Not on file   Family Status  Relation Name Status   Mother  Deceased   Father  Deceased   Sister  Deceased   Mat Uncle x2 Deceased   MGM  Deceased   Other  (Not Specified)   Other  (Not Specified)  No partnership data on file   Family History  Problem Relation Age of Onset   Lung cancer Mother    Dementia Mother    Parkinson's disease Father    Cancer Father        unk type   Diabetes Sister    Heart attack Sister    Breast cancer Sister    Cancer Maternal Uncle        unk types   Dementia Maternal Grandmother    Cancer Maternal Grandmother        unk type   Cancer Other    Dementia Other    Allergies  Allergen Reactions   Tylenol  [Acetaminophen ]  Per pt due to cirrhosis  of the liver   Aleve [Naproxen] Rash      Review of Systems  Genitourinary:  Positive for dysuria and hematuria.  Skin:  Positive for rash.      Objective:     BP 124/82 (Cuff Size: Normal)   Pulse 71   Temp 97.8 F (36.6 C) (Oral)   Resp 16   Ht 5\' 4"  (1.626 m)   Wt 175 lb 6.4 oz (79.6 kg)   LMP  (LMP Unknown) Comment: Patient is not a good historian  BMI 30.11 kg/m  BP Readings from Last 3 Encounters:  07/23/23 124/82  05/28/23 134/62  05/25/23 (!) 143/81   Wt Readings from Last 3 Encounters:  07/23/23 175 lb 6.4 oz (79.6 kg)  05/28/23 177 lb (80.3 kg)  05/25/23 177 lb 3.2 oz (80.4 kg)      Physical Exam Constitutional:      Appearance: Normal appearance.  HENT:     Head: Normocephalic and atraumatic.  Eyes:     Conjunctiva/sclera: Conjunctivae normal.  Cardiovascular:     Rate and Rhythm: Normal rate and regular rhythm.     Pulses:          Dorsalis pedis pulses are 2+ on the right side and 2+ on the left side.  Pulmonary:     Effort: Pulmonary effort is normal.     Breath sounds: Normal breath sounds.  Musculoskeletal:     Right foot: Normal range of motion. No deformity, bunion, Charcot foot, foot drop or prominent metatarsal heads.     Left foot: Normal range of motion. No deformity, bunion, Charcot foot, foot drop or prominent metatarsal heads.  Feet:     Right foot:     Protective Sensation: 6 sites tested.  6 sites sensed.     Skin integrity: Dry skin present.     Toenail Condition: Right toenails are normal.     Left foot:     Protective Sensation: 6 sites tested.  6 sites sensed.     Skin integrity: Dry skin present.     Toenail Condition: Left toenails are normal.  Skin:    General: Skin is warm and dry.     Findings: Rash present.  Neurological:     General: No focal deficit present.     Mental Status: She is alert. Mental status is at baseline.  Psychiatric:        Mood and Affect: Mood normal.        Behavior: Behavior normal.       No results found for any visits on 07/23/23.   Last CBC Lab Results  Component Value Date   WBC 2.4 (L) 05/28/2023   HGB 11.8 (L) 05/28/2023   HCT 35.7 (L) 05/28/2023   MCV 87.9 05/28/2023   MCH 29.1 05/28/2023   RDW 14.7 05/28/2023   PLT 53 (L) 05/28/2023   Last metabolic panel Lab Results  Component Value Date   GLUCOSE 186 (H) 05/28/2023   NA 142 05/28/2023   K 3.1 (L) 05/28/2023   CL 108 05/28/2023   CO2 24 05/28/2023   BUN 14 05/28/2023   CREATININE 0.67 05/28/2023   GFRNONAA >60 05/28/2023   CALCIUM  9.0 05/28/2023   PHOS 2.1 (L) 03/26/2015   PROT 6.9 05/28/2023   ALBUMIN 3.8 05/28/2023   BILITOT 0.6 05/28/2023   ALKPHOS 60 05/28/2023   AST 24 05/28/2023   ALT 16 05/28/2023   ANIONGAP 10 05/28/2023   Last lipids  Lab Results  Component Value Date   CHOL 177 04/23/2023   HDL 62 04/23/2023   LDLCALC 90 04/23/2023   TRIG 148 04/23/2023   CHOLHDL 2.9 04/23/2023   Last hemoglobin A1c Lab Results  Component Value Date   HGBA1C 7.5 (H) 04/23/2023   Last thyroid functions Lab Results  Component Value Date   TSH 0.767 03/25/2015   Last vitamin D No results found for: "25OHVITD2", "25OHVITD3", "VD25OH" Last vitamin B12 and Folate Lab Results  Component Value Date   VITAMINB12 361 08/14/2022   FOLATE 22.0 05/05/2022      The 10-year ASCVD risk score (Arnett DK, et al., 2019) is: 6.3%    Assessment & Plan:   1. Cystitis (Primary)/Vaginal discomfort: UA with leukocytes, nitrates, blood, etc. Will give Rocephin  1 g IM because patient uncertain when she can get her antibiotics from the pharmacy. Prescribe Bactrim  x 3 days. Send urine for culture.   - sulfamethoxazole -trimethoprim  (BACTRIM  DS) 800-160 MG tablet; Take 1 tablet by mouth 2 (two) times daily for 3 days.  Dispense: 6 tablet; Refill: 0 - cefTRIAXone  (ROCEPHIN ) injection 500 mg - cefTRIAXone  (ROCEPHIN ) injection 500 mg - POCT Urinalysis Dipstick - Cervicovaginal ancillary only  2.  Type 2 diabetes mellitus with other specified complication, without long-term current use of insulin  (HCC): A1c improved at 6.6%, continue Metformin  500 mg BID.   - POCT HgB A1C  3. Gastroesophageal reflux disease, unspecified whether esophagitis present: Symptoms improved with Tums, will prescribe Pepcid  to take as needed.   - famotidine  (PEPCID ) 40 MG tablet; Take 1 tablet (40 mg total) by mouth daily.  Dispense: 90 tablet; Refill: 1  4. Rash: Unknown etiology, potentially due to low platelets. Sample cream given to help with itching.    Return in about 6 months (around 01/22/2024).    Rockney Cid, DO

## 2023-07-24 ENCOUNTER — Inpatient Hospital Stay: Payer: MEDICAID | Attending: Radiation Oncology

## 2023-07-24 DIAGNOSIS — C50812 Malignant neoplasm of overlapping sites of left female breast: Secondary | ICD-10-CM | POA: Diagnosis present

## 2023-07-24 DIAGNOSIS — Z79899 Other long term (current) drug therapy: Secondary | ICD-10-CM | POA: Diagnosis not present

## 2023-07-24 DIAGNOSIS — Z5111 Encounter for antineoplastic chemotherapy: Secondary | ICD-10-CM | POA: Insufficient documentation

## 2023-07-24 DIAGNOSIS — C50912 Malignant neoplasm of unspecified site of left female breast: Secondary | ICD-10-CM

## 2023-07-24 DIAGNOSIS — C50511 Malignant neoplasm of lower-outer quadrant of right female breast: Secondary | ICD-10-CM | POA: Diagnosis present

## 2023-07-24 MED ORDER — FULVESTRANT 250 MG/5ML IM SOSY
500.0000 mg | PREFILLED_SYRINGE | Freq: Once | INTRAMUSCULAR | Status: AC
  Administered 2023-07-24: 500 mg via INTRAMUSCULAR
  Filled 2023-07-24: qty 10

## 2023-07-27 LAB — CERVICOVAGINAL ANCILLARY ONLY
Candida Glabrata: NEGATIVE
Candida Vaginitis: NEGATIVE
Chlamydia: NEGATIVE
Comment: NEGATIVE
Comment: NEGATIVE
Comment: NEGATIVE
Comment: NEGATIVE
Comment: NORMAL
Neisseria Gonorrhea: NEGATIVE
Trichomonas: NEGATIVE

## 2023-07-29 ENCOUNTER — Encounter: Payer: Self-pay | Admitting: Internal Medicine

## 2023-08-19 ENCOUNTER — Ambulatory Visit
Admission: RE | Admit: 2023-08-19 | Discharge: 2023-08-19 | Disposition: A | Discharge status: Home / Self Care | Payer: MEDICAID | Source: Ambulatory Visit | Attending: Oncology

## 2023-08-19 DIAGNOSIS — R188 Other ascites: Secondary | ICD-10-CM | POA: Insufficient documentation

## 2023-08-19 DIAGNOSIS — N200 Calculus of kidney: Secondary | ICD-10-CM | POA: Insufficient documentation

## 2023-08-19 DIAGNOSIS — I700 Atherosclerosis of aorta: Secondary | ICD-10-CM | POA: Diagnosis not present

## 2023-08-19 DIAGNOSIS — C7951 Secondary malignant neoplasm of bone: Secondary | ICD-10-CM | POA: Diagnosis not present

## 2023-08-19 DIAGNOSIS — R161 Splenomegaly, not elsewhere classified: Secondary | ICD-10-CM | POA: Insufficient documentation

## 2023-08-19 DIAGNOSIS — I8500 Esophageal varices without bleeding: Secondary | ICD-10-CM | POA: Diagnosis not present

## 2023-08-19 DIAGNOSIS — C50912 Malignant neoplasm of unspecified site of left female breast: Secondary | ICD-10-CM | POA: Insufficient documentation

## 2023-08-19 DIAGNOSIS — K766 Portal hypertension: Secondary | ICD-10-CM | POA: Diagnosis not present

## 2023-08-19 LAB — GLUCOSE, CAPILLARY: Glucose-Capillary: 111 mg/dL — ABNORMAL HIGH (ref 70–99)

## 2023-08-19 MED ORDER — FLUDEOXYGLUCOSE F - 18 (FDG) INJECTION
9.1000 | Freq: Once | INTRAVENOUS | Status: AC | PRN
  Administered 2023-08-19: 9.92 via INTRAVENOUS

## 2023-08-20 ENCOUNTER — Other Ambulatory Visit: Payer: Self-pay | Admitting: Internal Medicine

## 2023-08-20 ENCOUNTER — Other Ambulatory Visit: Payer: Self-pay | Admitting: Oncology

## 2023-08-20 DIAGNOSIS — F419 Anxiety disorder, unspecified: Secondary | ICD-10-CM

## 2023-08-21 ENCOUNTER — Encounter: Payer: Self-pay | Admitting: Oncology

## 2023-08-21 NOTE — Telephone Encounter (Signed)
 Requested Prescriptions  Pending Prescriptions Disp Refills   hydrOXYzine  (ATARAX ) 10 MG tablet [Pharmacy Med Name: hydrOXYzine  HCl 10 MG Oral Tablet] 180 tablet 0    Sig: TAKE 1 TABLET BY MOUTH TWICE DAILY AS NEEDED FOR ITCHING OR ANXIETY     Ear, Nose, and Throat:  Antihistamines 2 Passed - 08/21/2023  5:22 PM      Passed - Cr in normal range and within 360 days    Creatinine  Date Value Ref Range Status  04/28/2023 0.56 0.44 - 1.00 mg/dL Final   Creat  Date Value Ref Range Status  04/23/2023 0.74 0.50 - 1.05 mg/dL Final   Creatinine, Ser  Date Value Ref Range Status  05/28/2023 0.67 0.44 - 1.00 mg/dL Final   Creatinine, Urine  Date Value Ref Range Status  04/23/2023 176 20 - 275 mg/dL Final         Passed - Valid encounter within last 12 months    Recent Outpatient Visits           4 weeks ago Cystitis   [facility] Rockney Cid, [place]

## 2023-08-26 ENCOUNTER — Telehealth: Payer: Self-pay

## 2023-08-26 ENCOUNTER — Inpatient Hospital Stay: Payer: MEDICAID | Attending: Radiation Oncology

## 2023-08-26 ENCOUNTER — Inpatient Hospital Stay: Payer: MEDICAID

## 2023-08-26 ENCOUNTER — Inpatient Hospital Stay (HOSPITAL_BASED_OUTPATIENT_CLINIC_OR_DEPARTMENT_OTHER): Payer: MEDICAID | Admitting: Oncology

## 2023-08-26 ENCOUNTER — Encounter: Payer: Self-pay | Admitting: Oncology

## 2023-08-26 VITALS — BP 133/84 | HR 68 | Temp 98.5°F | Resp 18

## 2023-08-26 DIAGNOSIS — K7460 Unspecified cirrhosis of liver: Secondary | ICD-10-CM | POA: Insufficient documentation

## 2023-08-26 DIAGNOSIS — R188 Other ascites: Secondary | ICD-10-CM | POA: Insufficient documentation

## 2023-08-26 DIAGNOSIS — D696 Thrombocytopenia, unspecified: Secondary | ICD-10-CM

## 2023-08-26 DIAGNOSIS — R591 Generalized enlarged lymph nodes: Secondary | ICD-10-CM | POA: Diagnosis not present

## 2023-08-26 DIAGNOSIS — Z170 Estrogen receptor positive status [ER+]: Secondary | ICD-10-CM

## 2023-08-26 DIAGNOSIS — Z5111 Encounter for antineoplastic chemotherapy: Secondary | ICD-10-CM | POA: Diagnosis present

## 2023-08-26 DIAGNOSIS — F323 Major depressive disorder, single episode, severe with psychotic features: Secondary | ICD-10-CM

## 2023-08-26 DIAGNOSIS — C50912 Malignant neoplasm of unspecified site of left female breast: Secondary | ICD-10-CM

## 2023-08-26 DIAGNOSIS — C50812 Malignant neoplasm of overlapping sites of left female breast: Secondary | ICD-10-CM | POA: Insufficient documentation

## 2023-08-26 DIAGNOSIS — C50511 Malignant neoplasm of lower-outer quadrant of right female breast: Secondary | ICD-10-CM

## 2023-08-26 DIAGNOSIS — K766 Portal hypertension: Secondary | ICD-10-CM | POA: Insufficient documentation

## 2023-08-26 LAB — CBC WITH DIFFERENTIAL (CANCER CENTER ONLY)
Abs Immature Granulocytes: 0.01 10*3/uL (ref 0.00–0.07)
Basophils Absolute: 0 10*3/uL (ref 0.0–0.1)
Basophils Relative: 0 %
Eosinophils Absolute: 0.1 10*3/uL (ref 0.0–0.5)
Eosinophils Relative: 2 %
HCT: 32.6 % — ABNORMAL LOW (ref 36.0–46.0)
Hemoglobin: 10.5 g/dL — ABNORMAL LOW (ref 12.0–15.0)
Immature Granulocytes: 0 %
Lymphocytes Relative: 17 %
Lymphs Abs: 0.4 10*3/uL — ABNORMAL LOW (ref 0.7–4.0)
MCH: 28.8 pg (ref 26.0–34.0)
MCHC: 32.2 g/dL (ref 30.0–36.0)
MCV: 89.3 fL (ref 80.0–100.0)
Monocytes Absolute: 0.2 10*3/uL (ref 0.1–1.0)
Monocytes Relative: 10 %
Neutro Abs: 1.7 10*3/uL (ref 1.7–7.7)
Neutrophils Relative %: 71 %
Platelet Count: 67 10*3/uL — ABNORMAL LOW (ref 150–400)
RBC: 3.65 MIL/uL — ABNORMAL LOW (ref 3.87–5.11)
RDW: 14.6 % (ref 11.5–15.5)
WBC Count: 2.4 10*3/uL — ABNORMAL LOW (ref 4.0–10.5)
nRBC: 0 % (ref 0.0–0.2)

## 2023-08-26 LAB — CMP (CANCER CENTER ONLY)
ALT: 13 U/L (ref 0–44)
AST: 22 U/L (ref 15–41)
Albumin: 3.7 g/dL (ref 3.5–5.0)
Alkaline Phosphatase: 62 U/L (ref 38–126)
Anion gap: 8 (ref 5–15)
BUN: 16 mg/dL (ref 8–23)
CO2: 24 mmol/L (ref 22–32)
Calcium: 9.1 mg/dL (ref 8.9–10.3)
Chloride: 106 mmol/L (ref 98–111)
Creatinine: 0.75 mg/dL (ref 0.44–1.00)
GFR, Estimated: >60 mL/min (ref >60)
Glucose, Bld: 125 mg/dL — ABNORMAL HIGH (ref 70–99)
Potassium: 3.7 mmol/L (ref 3.5–5.1)
Sodium: 138 mmol/L (ref 135–145)
Total Bilirubin: 0.6 mg/dL (ref 0.0–1.2)
Total Protein: 6.9 g/dL (ref 6.5–8.1)

## 2023-08-26 MED ORDER — FULVESTRANT 250 MG/5ML IM SOSY
500.0000 mg | PREFILLED_SYRINGE | Freq: Once | INTRAMUSCULAR | Status: AC
  Administered 2023-08-26: 500 mg via INTRAMUSCULAR
  Filled 2023-08-26: qty 10

## 2023-08-26 NOTE — Assessment & Plan Note (Signed)
 [name]

## 2023-08-26 NOTE — Assessment & Plan Note (Signed)
 Bilateral breast invasive lobular carcinoma, s/p bilateral mastectomy. [name]   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  PET scan showed progressive disease including scattered hypermetabolic bone lesions, small but hypermetabolic lymphadenopathy, concerning for disease progression. Recommend to continue Arimidex  1mg  daily, proceed with monthly fulvestrant  injection  I recommend CT-guided biopsy of lymphadenopathy to confirm pathology. It is challenging to start her on CDK 4/6 inhibitors due to chronic thrombocytopenia.  NGS showed PIK 3 CA mutation.  May consider fulvestrant  with capivasertib

## 2023-08-26 NOTE — Assessment & Plan Note (Signed)
 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 Recommend diagnostic paracentesis.

## 2023-08-26 NOTE — Assessment & Plan Note (Signed)
 Due to splenomegaly.  17cm Previous bone marrow biopsy showed mildly hypercellular bone marrow with otherwise orderly trilineage hematopoiesis. A definitive morphologic etiology for this pancytopenia is not noted. Negative cytogenetics, negative myeloid FISH testing. Continue B12 supplementation.

## 2023-08-26 NOTE — Assessment & Plan Note (Signed)
 See above plan.

## 2023-08-26 NOTE — Telephone Encounter (Signed)
 Pt will need:   Diagnostic Paracentesis ASAP  CT guided biopsy of lymphadenopathy  Request sent to IR and dates pending.

## 2023-08-26 NOTE — Progress Notes (Signed)
 Hematology/Oncology Progress note Telephone:([phone] [phone]       CHIEF COMPLAINTS/REASON FOR VISIT:  bilateral breast cancer, thrombocytopenia, leukopenia  ASSESSMENT & PLAN:   Cancer Staging  Malignant neoplasm of left breast, stage 4 (HCC) Staging form: Breast, AJCC 8th Edition - Pathologic stage from 11/25/2021: Stage IB (pT2, pN1, cM0, G2, ER+, PR+, HER2-) - Signed by Timmy Forbes, MD on 11/25/2021 - Pathologic stage from 07/25/2022: Stage IV (rpTX, pNX, pM1, ER+, PR: Not Assessed, HER2-) - Signed by Timmy Forbes, MD on 08/14/2022  Malignant neoplasm of lower-outer quadrant of right breast of female, estrogen receptor positive (HCC) Staging form: Breast, AJCC 8th Edition - Pathologic stage from 11/25/2021: Stage IIIA (pT1c, pN3a, cM0, G2, ER+, PR+, HER2-) - Signed by Timmy Forbes, MD on 11/25/2021   Malignant neoplasm of left breast, 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 [name]   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  PET scan showed progressive disease including scattered hypermetabolic bone lesions, small but hypermetabolic lymphadenopathy, concerning for disease progression. Recommend to continue Arimidex  1mg  daily, proceed with  monthly fulvestrant  injection  I recommend CT-guided biopsy of lymphadenopathy to confirm pathology. It is challenging to start her on CDK 4/6 inhibitors due to chronic thrombocytopenia.  NGS showed PIK 3 CA mutation.  May consider fulvestrant  with capivasertib   Malignant neoplasm of lower-outer quadrant of right breast of female, estrogen receptor positive (HCC) See above plan.  Severe major depression, single episode, with psychotic features, mood-congruent (HCC) [name]    Thrombocytopenia (HCC) Due to splenomegaly.  17cm Previous bone marrow biopsy showed mildly hypercellular bone marrow with otherwise orderly trilineage hematopoiesis. A definitive morphologic etiology for this pancytopenia is not noted. Negative cytogenetics, negative myeloid FISH testing. Continue B12 supplementation.     Portal hypertension (HCC) Suspect underlying liver cirrhosis. Recommend patient to follow-up with gastroenterology.  She was previously seen by Dr. Antony Baumgartner  Ascites Malignant versus due to liver cirrhosis/portal hypertension. Recommend diagnostic paracentesis.  Plan was discussed with patient and her husband  Orders Placed This Encounter  Procedures   CT BIOPSY    Standing Status:   Future    Expected Date:   09/02/2023    Expiration Date:   08/25/2024    Lab orders requested (DO NOT place separate lab orders, these will be automatically ordered during procedure specimen collection)::   Surgical Pathology    Reason for Exam (SYMPTOM  OR DIAGNOSIS REQUIRED):   Ct guided biopsy of lymphadenopathy    Preferred location?:   [facility]   US  Paracentesis    Standing Status:   Future    Expected Date:   08/27/2023    Expiration Date:   08/25/2024    If therapeutic, is there a maximum amount of fluid to be removed?:   Yes    What is the maximum amount of  fluide to be removed?:   4L    Are labs required for specimen collection?:   Yes    Lab orders requested (DO  NOT place separate lab orders, these will be automatically ordered during procedure specimen collection)::   Cytology - Non Pap    Lab orders requested (DO NOT place separate lab orders, these will be automatically ordered during procedure specimen collection)::   Body Fluid Cell Count With Differential    Lab orders requested (DO NOT place separate lab orders, these will be automatically ordered during procedure specimen collection)::   Albumin, Body Fluid    Lab orders requested (DO NOT place separate lab orders, these will be automatically ordered during procedure specimen collection)::   Protein, Body Fluid    Lab orders requested (DO NOT place separate lab orders, these will be automatically ordered during procedure specimen collection)::   Lactate Dehydrogenase, Body Fluid    Lab orders requested (DO NOT place separate lab orders, these will be automatically ordered during procedure specimen collection)::   Glucose, Body Fluid    Is Albumin medication needed?:   No    Reason for Exam (SYMPTOM  OR DIAGNOSIS REQUIRED):   ascites    Preferred imaging location?:   [facility]   Follow up to be determined.  [name], questions or concerns.  Timmy Forbes, MD, PhD [facility] Hematology Oncology 08/26/2023     HISTORY OF PRESENTING ILLNESS:   is a  63 y.o.  female presents for follow up of bilateral breast cancer, thrombocytopenia and leukopenia.  Oncology History  Malignant neoplasm of left breast, stage 4 (HCC)  08/06/2021 Mammogram   08/06/2021, digital bilateral mammogram and ultrasound showed left breast 3:00 mass, 1.7 x 1.7 x 1.7 cm, ultrasound of the left axillary is negative. 08/21/2021 right breast 1 x 0.7 x 0.8 cm angulated spiculated mass at the right breast 7:00, 5 cm from nipple.  Ultrasound of the right axillary demonstrates 3 abnormal thickened cortex lymph node. Patient was recommended to proceed with  biopsy left breast 3:00 invasive mammry carcinoma with lobular features. in situ carcinoma, lobular neoplasia. 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 Peggie Bowen right breast 7:00, invasive mammary carcinoma with lobular features, in situ carcinoma,  ER+, PR+, HER 2-This was found to be concordant by Dr. Peggie Bowen. right axilla lymph node +, extracapsular extension.  -This was found to be concordant by Dr. Peggie Bowen   08/28/2021 Initial Diagnosis   Malignant neoplasm of upper-outer quadrant of left breast in female, estrogen receptor positive (HCC)   08/28/2021 Imaging   CT CHEST, ABDOMEN, AND PELVIS WITH CONTRAST Asymmetric mildly enlarged right axillary nodes. Correlate with biopsy. There are some punctate foci of sclerosis in the included spine that could reflect subtle metastatic disease. Nonobstructing bilateral renal calculi.   08/29/2021 Imaging   BILATERAL BREAST MRI WITH AND WITHOUT CONTRAST 1. 2.5 centimeter enhancing mass in the LOWER OUTER QUADRANT of the RIGHT breast, correlating with known malignancy. 2. At least 4 enlarged RIGHT axillary lymph nodes, correlating well with recently biopsied lymph node showing metastatic disease. 3. 3.4 centimeter enhancing mass in the LEFT breast, with an additional 2.9 centimeters of non mass enhancement posterior to the mass also suspicious for malignancy. This likely correlates with the area calcifications seen mammographically. 4. 5 millimeters satellite nodule along the LATERAL aspect of known malignancy in the LOWER OUTER  QUADRANT of the LEFT breast. 5. LEFT axilla is negative for adenopathy.       09/05/2021 Imaging   NUCLEAR MEDICINE WHOLE BODY BONE SCAN Uptake at L2 which is nonspecific but may be related to advanced degenerative disc and facet disease changes at both L1-L2 and L2-L3; no evidence of osseous metastatic disease by CT. No definite osseous metastatic lesions identified.    Genetic Testing    Negative genetic testing. No pathogenic variants identified on the [facility] CancerNext-Expanded+RNA panel. The report date is 10/03/2021.  The CancerNext-Expanded + RNAinsight gene panel offered by [employer] and includes sequencing and rearrangement analysis for the following 77 genes: IP, ALK, APC*, ATM*, AXIN2, BAP1, BARD1, BLM, BMPR1A, BRCA1*, BRCA2*, BRIP1*, CDC73, CDH1*,CDK4, CDKN1B, CDKN2A, CHEK2*, CTNNA1, DICER1, FANCC, FH, FLCN, GALNT12, KIF1B, LZTR1, MAX, MEN1, MET, MLH1*, MSH2*, MSH3, MSH6*, MUTYH*, NBN, NF1*, NF2, NTHL1, PALB2*, PHOX2B, PMS2*, POT1, PRKAR1A, PTCH1, PTEN*, RAD51C*, RAD51D*,RB1, RECQL, RET, SDHA, SDHAF2, SDHB, SDHC, SDHD, SMAD4, SMARCA4, SMARCB1, SMARCE1, STK11, SUFU, TMEM127, TP53*,TSC1, TSC2, VHL and XRCC2 (sequencing and deletion/duplication); EGFR, EGLN1, HOXB13, KIT, MITF, PDGFRA, POLD1 and POLE (sequencing only); EPCAM and GREM1 (deletion/duplication only).   11/15/2021 Surgery   She underwent left simple mastectomy and right modified mastectomy.   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  pT2 pN1a, ER +90%, PR +90%, HER2 negative (1+)  # Right Breast invasive lobular carcinoma Grade 2, negative margins, right axillary lymph nodes  30/32 involved with metastatic carcinoma, pT1c pN3a ER +90%, PR +51-90%, HER2 negative (1+)    11/25/2021 Cancer Staging   Staging form: Breast, AJCC 8th Edition - Pathologic stage from 11/25/2021: Stage IB (pT2, pN1, cM0, G2, ER+, PR+, HER2-) - Signed by Timmy Forbes, MD on 11/25/2021 Stage prefix: Initial diagnosis Multigene prognostic tests performed: None Histologic grading system: 3 grade system   01/09/2022 - 03/10/2022 Radiation Therapy   Adjuvant breast Radiation.    07/25/2022 Cancer Staging   Staging form: Breast,  AJCC 8th Edition - Pathologic stage from 07/25/2022: Stage IV (rpTX, pNX, pM1, ER+, PR: Not Assessed, HER2-) - Signed by Timmy Forbes, MD on 08/14/2022 Stage prefix: Recurrence Multigene prognostic tests performed: None   07/25/2022 Procedure   Colonoscopy showed Findings - Six 5 to 6 mm polyps in the ascending colon, removed with a cold snare. Resected and retrieved. - One 10 mm polyp in the descending colon, removed with a cold snare. Resected and retrieved. Clip was placed. - One 30 mm polyp in the rectum, removed with a hot snare. Resected and retrieved. Clips were placed.  Pathology showed A.  COLON POLYP X 6, ASCENDING AND CECUM; COLD SNARE: - COLONIC MUCOSA WITH METASTATIC LOBULAR BREAST CARCINOMA (3) - HYPERPLASTIC POLYP (3)  B.  COLON POLYP, DESCENDING; COLD SNARE: - TUBULAR ADENOMA. - NEGATIVE FOR HIGH-GRADE DYSPLASIA AND MALIGNANCY.  C.  RECTUM POLYP; HOT SNARE: - TUBULOVILLOUS ADENOMA. - NEGATIVE FOR HIGH-GRADE DYSPLASIA AND MALIGNANCY.  Comment: The metastatic lobular breast carcinoma is positive for GATA-3 and ER (strong staining in greater than 90% of the tumor cells). HER2 negative (1+)     08/06/2022 Imaging   PET  Status post bilateral mastectomy with radiation changes in the anterior upper lobes.   No findings suspicious for recurrent or metastatic disease   09/18/2022 Imaging   MRI brain w wo  1. No evidence of intracranial metastatic disease.  2. Mild multifocal T2 FLAIR hyperintense signal abnormality within the cerebral white matter, nonspecific but most often secondary to chronic small vessel ischemia. 3. Paranasal sinus disease as described.   01/27/2023 Imaging   US  left axillary No sonographic evidence of malignancy at the site of palpable concern in the LEFT axilla.    04/22/2023 Imaging   CT chest abdomen pelvis w contrast showed 1. New right chest wall soft tissue lesion which may represent locally recurrent or metastatic disease. Attention on  follow-up. 2. New right seventh rib sclerotic osseous lesion. 3. Splenomegaly.    05/11/2023 Procedure   CT guided biopsy of right axillary nodule   BENIGN FIBROVASCULAR TISSUE.       NO LYMPHOID TISSUE IDENTIFIED.       NEGATIVE FOR MALIGNANCY.    08/19/2023 Imaging   PET scan showed 1. 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 2. Small but hypermetabolic right level IIA, right level IIb, right level V, and bilateral inguinal lymph nodes, suspicious for metastatic disease. 3. Progressive perihepatic ascites, splenomegaly, and findings of portal venous hypertension including upstream varices adjacent to the distal esophagus and collateral vessels/varices in the gastrohepatic ligament. Progressive edema along the gastrohepatic ligament, peripancreatic region, and retroperitoneum. 4. Nonobstructive bilateral nephrolithiasis. 5.  Aortic Atherosclerosis (ICD10-I70.0).     Malignant neoplasm of lower-outer quadrant of right breast of female, estrogen receptor positive (HCC)  08/06/2021 Mammogram   08/06/2021, digital bilateral mammogram and ultrasound showed left breast 3:00 mass, 1.7 x 1.7 x 1.7 cm, ultrasound of the left axillary is negative. 08/21/2021 right breast 1 x 0.7 x 0.8 cm angulated spiculated mass at the right breast 7:00, 5 cm from nipple.  Ultrasound of the right axillary demonstrates 3 abnormal thickened cortex lymph node. 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 Peggie Bowen right breast 7:00, invasive mammary carcinoma with lobular features, in situ carcinoma,  ER+, PR+, HER 2-This was found to be concordant by Dr. Peggie Bowen. right axilla lymph node +, extracapsular extension.  -This was found to be concordant by Dr. Peggie Bowen    08/28/2021 Initial Diagnosis   Malignant neoplasm of lower-outer quadrant of right breast of female, estrogen receptor positive (HCC)   08/28/2021 Imaging   CT CHEST, ABDOMEN, AND PELVIS WITH CONTRAST Asymmetric mildly enlarged right axillary nodes. Correlate with biopsy. There are some punctate foci of sclerosis in the included spine that could reflect subtle metastatic disease. Nonobstructing bilateral renal calculi.   08/29/2021 Imaging   BILATERAL BREAST MRI WITH AND WITHOUT CONTRAST 1. 2.5 centimeter enhancing mass in the LOWER OUTER QUADRANT of the RIGHT breast, correlating with known malignancy. 2. At least 4 enlarged RIGHT axillary lymph nodes, correlating well with recently biopsied lymph node showing metastatic disease. 3. 3.4 centimeter enhancing mass in the LEFT breast, with an additional 2.9 centimeters of non mass enhancement posterior to the mass also suspicious for malignancy. This likely correlates with the area calcifications seen mammographically. 4. 5 millimeters satellite nodule along the LATERAL aspect of known malignancy in the LOWER OUTER QUADRANT of the LEFT breast. 5. LEFT axilla is negative for adenopathy.       09/05/2021 Imaging   NUCLEAR MEDICINE WHOLE BODY BONE SCAN Uptake at L2 which is nonspecific but may be related to advanced degenerative disc and facet disease  changes at both L1-L2 and L2-L3; no evidence of osseous metastatic disease by CT. No definite osseous metastatic lesions identified.   09/09/2021 Oncotype testing   [id number] block RIGHT 7:00 5 CM FN; ULTRASOUND-GUIDED BIOPSY Oncotype Dx RS score 22    Genetic Testing   Negative genetic testing. No pathogenic variants identified on the [facility] CancerNext-Expanded+RNA panel. The report date is 10/03/2021.  The CancerNext-Expanded + RNAinsight gene panel offered by [employer] and includes sequencing and rearrangement analysis for the following 77 genes: IP, ALK, APC*, ATM*, AXIN2, BAP1, BARD1,  BLM, BMPR1A, BRCA1*, BRCA2*, BRIP1*, CDC73, CDH1*,CDK4, CDKN1B, CDKN2A, CHEK2*, CTNNA1, DICER1, FANCC, FH, FLCN, GALNT12, KIF1B, LZTR1, MAX, MEN1, MET, MLH1*, MSH2*, MSH3, MSH6*, MUTYH*, NBN, NF1*, NF2, NTHL1, PALB2*, PHOX2B, PMS2*, POT1, PRKAR1A, PTCH1, PTEN*, RAD51C*, RAD51D*,RB1, RECQL, RET, SDHA, SDHAF2, SDHB, SDHC, SDHD, SMAD4, SMARCA4, SMARCB1, SMARCE1, STK11, SUFU, TMEM127, TP53*,TSC1, TSC2, VHL and XRCC2 (sequencing and deletion/duplication); EGFR, EGLN1, HOXB13, KIT, MITF, PDGFRA, POLD1 and POLE (sequencing only); EPCAM and GREM1 (deletion/duplication only).   11/15/2021 Surgery   She underwent left simple mastectomy and right modified mastectomy.   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  pT2 pN1a, ER +90%, PR +90%, HER2 negative (1+)  # Right Breast invasive lobular carcinoma Grade 2, negative margins, right axillary lymph nodes  30/32 involved with metastatic carcinoma, pT1c pN3a ER +90%, PR +51-90%, HER2 negative (1+)    11/25/2021 Cancer Staging   Staging form: Breast, AJCC 8th Edition - Pathologic stage from 11/25/2021: Stage IIIA (pT1c, pN3a, cM0, G2, ER+, PR+, HER2-) - Signed by Timmy Forbes, MD on 11/25/2021 Stage prefix: Initial diagnosis Multigene prognostic tests performed: None Histologic grading system: 3 grade system   12/10/2021 Imaging   PET scan  1. Interval bilateral mastectomy and right axillary node dissection.No evidence of residual disease in the chest wall, nodal metastases or distant metastases. 2. Focal hypermetabolic activity in the proximal rectum corresponding with an intraluminal polypoid lesion, suspicious for a villous adenoma or early colon cancer. Sigmoidoscopy/colonoscopy recommended unless recently performed   12/10/2021 Imaging   1.  Interval bilateral mastectomy and right axillary node dissection.No evidence of residual disease in the chest wall, nodal metastasesor distant metastases. 2. Focal hypermetabolic activity in the proximal rectum corresponding with an intraluminal polypoid lesion, suspicious for a villous adenoma or early colon cancer. Sigmoidoscopy/colonoscopy recommended unless recently performed    She is a poor historian. History of major depression, psychosis, previously on olanzapine  and Remeron  not currently on any and if not currently following up with psychiatrist. Patient's family history is positive for sister with breast cancer   INTERVAL HISTORY Sharon TABBERT is a 63 y.o. female who has above history reviewed by me today presents for follow up visit for management of bilateral breast cancer She has been on Lexapro  since Oct 2023.  Patient reports increasing abdomen distention.  She is accompanied by her husband.   Review of Systems  Constitutional:  Negative for appetite change, chills, fatigue and fever.  HENT:   Negative for hearing loss and voice change.   Eyes:  Negative for eye problems.  Respiratory:  Negative for chest tightness and cough.   Cardiovascular:  Negative for chest pain.  Gastrointestinal:  Positive for abdominal distention. Negative for abdominal pain and blood in stool.  Endocrine: Negative for hot flashes.  Genitourinary:  Negative for difficulty urinating  and frequency.   Musculoskeletal:  Negative for arthralgias.  Skin:  Negative for itching and rash.  Neurological:  Negative for extremity weakness.  Hematological:  Negative for adenopathy.  Psychiatric/Behavioral:  Negative for confusion.     MEDICAL HISTORY:  Past Medical History:  Diagnosis Date   Anxiety    Cancer (HCC)    Depression    Diabetes mellitus without complication (HCC)    History of high cholesterol 2000   Hyperlipidemia    Hypertension    Patient denies medical problems     Thrombocytopenia (HCC)     SURGICAL HISTORY: Past Surgical History:  Procedure Laterality Date   ABDOMINAL HYSTERECTOMY     AXILLARY SENTINEL NODE BIOPSY Left 11/15/2021   Procedure: AXILLARY SENTINEL NODE BIOPSY;  Surgeon: Flynn Hylan, MD;  Location: [facility] ORS;  Service: General;  Laterality: Left;   BREAST BIOPSY Right 08/21/2021   us  bx 7:00 mass coil clip path pending   BREAST BIOPSY Right 08/21/2021   us  bx of LN, hydro marker, path pending   BREAST BIOPSY Left 08/21/2021   us  bx, heart marker, path pending   CESAREAN SECTION     x2   COLONOSCOPY WITH PROPOFOL  N/A 07/25/2022   Procedure: COLONOSCOPY WITH PROPOFOL ;  Surgeon: Luke Salaam, MD;  Location: [facility] ENDOSCOPY;  Service: Gastroenterology;  Laterality: N/A;   MASTECTOMY     MASTECTOMY MODIFIED RADICAL Right 11/15/2021   Procedure: MASTECTOMY MODIFIED RADICAL;  Surgeon: Flynn Hylan, MD;  Location: [facility] ORS;  Service: General;  Laterality: Right;   NO PAST SURGERIES     TOTAL MASTECTOMY Left 11/15/2021   Procedure: TOTAL MASTECTOMY;  Surgeon: Flynn Hylan, MD;  Location: [facility] ORS;  Service: General;  Laterality: Left;    SOCIAL HISTORY: Social History   Socioeconomic History   Marital status: Married    Spouse name: Not on file   Number of children: Not on file   Years of education: Not on file   Highest education level: Not on file  Occupational History   Not on file  Tobacco Use   Smoking status: Former    Current packs/day: 0.00    Types: Cigarettes    Quit date: 2013    Years since quitting: 12.4    Passive exposure: Current   Smokeless tobacco: Never  Vaping Use   Vaping status: Never Used  Substance and Sexual Activity   Alcohol use: Not Currently    Comment: occasionally   Drug use: No   Sexual activity: Not Currently    Comment: unable to assess   Other Topics Concern   Not on file  Social History Narrative   Not on file   Social Drivers of Health   Financial Resource Strain:  Medium Risk (10/10/2021)   Overall Financial Resource Strain (CARDIA)    Difficulty of Paying Living Expenses: Somewhat hard  Food Insecurity: No Food Insecurity (10/10/2021)   Hunger Vital Sign    Worried About Running Out of Food in the Last Year: Never true    Ran Out of Food in the Last Year: Never true  Transportation Needs: Unmet Transportation Needs (01/21/2022)   PRAPARE - Transportation    Lack of Transportation (Medical): Yes    Lack of Transportation (Non-Medical): Yes  Physical Activity: Inactive (10/10/2021)   Exercise Vital Sign    Days of Exercise per Week: 0 days    Minutes of Exercise per Session: 0 min  Stress: Stress Concern Present (10/10/2021)   [employer] of Occupational Health -  Occupational Stress Questionnaire    Feeling of Stress : To some extent  Social Connections: Socially Isolated (10/10/2021)   Social Connection and Isolation Panel [NHANES]    Frequency of Communication with Friends and Family: Once a week    Frequency of Social Gatherings with Friends and Family: Never    Attends Religious Services: Never    Diplomatic Services operational officer: No    Attends Engineer, structural: Never    Marital Status: Married  Catering manager Violence: Not on file    FAMILY HISTORY: Family History  Problem Relation Age of Onset   Lung cancer Mother    Dementia Mother    Parkinson's disease Father    Cancer Father        unk type   Diabetes Sister    Heart attack Sister    Breast cancer Sister    Cancer Maternal Uncle        unk types   Dementia Maternal Grandmother    Cancer Maternal Grandmother        unk type   Cancer Other    Dementia Other     ALLERGIES:  is allergic to tylenol  [acetaminophen ] and aleve [naproxen].  MEDICATIONS:  Current Outpatient Medications  Medication Sig Dispense Refill   anastrozole  (ARIMIDEX ) 1 MG tablet Take 1 tablet by mouth once daily 90 tablet 0   calcium  carbonate (OS-CAL) 600 MG TABS tablet Take  2 tablets (1,200 mg total) by mouth daily. 60 tablet 5   cyanocobalamin  (VITAMIN B12) 1000 MCG tablet Take 1 tablet (1,000 mcg total) by mouth daily. 30 tablet 2   escitalopram  (LEXAPRO ) 20 MG tablet Take 1 tablet (20 mg total) by mouth daily. 90 tablet 1   famotidine  (PEPCID ) 40 MG tablet Take 1 tablet (40 mg total) by mouth daily. 90 tablet 1   hydrOXYzine  (ATARAX ) 10 MG tablet TAKE 1 TABLET BY MOUTH TWICE DAILY AS NEEDED FOR ITCHING OR ANXIETY 180 tablet 0   metFORMIN  (GLUCOPHAGE ) 500 MG tablet Take 1 tablet (500 mg total) by mouth 2 (two) times daily with a meal. 180 tablet 1   rosuvastatin  (CRESTOR ) 5 MG tablet Take 1 tablet by mouth once daily 90 tablet 1   triamcinolone  ointment (KENALOG ) 0.5 % Apply 1 Application topically 2 (two) times daily. 30 g 1   ACCU-CHEK GUIDE test strip USE TO CHECK BLOOD SUGAR UP TO 4 TIMES DAILY AS DIRECTED (Patient not taking: Reported on 08/26/2023) 100 each 0   Accu-Chek Softclix Lancets lancets USE TO CHECK BLOOD SUAGR UP TO 4 TIMES DAILY AS DIRECTED (Patient not taking: Reported on 08/26/2023) 100 each 0   blood glucose meter kit and supplies KIT Dispense based on patient and insurance preference. Use up to four times daily as directed. (Patient not taking: Reported on 08/26/2023) 1 each 1   Current Facility-Administered Medications  Medication Dose Route Frequency Provider Last Rate Last Admin   cefTRIAXone  (ROCEPHIN ) injection 500 mg  500 mg Intramuscular Once Rockney Cid, DO         PHYSICAL EXAMINATION: ECOG PERFORMANCE STATUS: 1 - Symptomatic but completely ambulatory Vitals:   08/26/23 1456  BP: 133/84  Pulse: 68  Resp: 18  Temp: 98.5 F (36.9 C)  SpO2: 100%    There were no vitals filed for this visit.    Physical Exam Constitutional:      General: She is not in acute distress. HENT:     Head: Normocephalic and atraumatic.  Eyes:  General: No scleral icterus. Cardiovascular:     Rate and Rhythm: Normal rate.  Pulmonary:      Effort: Pulmonary effort is normal. No respiratory distress.     Breath sounds: No wheezing.  Abdominal:     General: Bowel sounds are normal. There is distension.     Palpations: Abdomen is soft.  Musculoskeletal:        General: No deformity. Normal range of motion.     Cervical back: Normal range of motion and neck supple.  Skin:    Coloration: Skin is not jaundiced.  Neurological:     Mental Status: She is alert and oriented to person, place, and time. Mental status is at baseline.     LABORATORY DATA:  I have reviewed the data as listed    Latest Ref Rng & Units 08/26/2023    2:20 PM 05/28/2023    8:30 PM 05/11/2023   11:15 AM  CBC  WBC 4.0 - 10.5 K/uL 2.4  2.4  2.9   Hemoglobin 12.0 - 15.0 g/dL 95.2  84.1  32.4   Hematocrit 36.0 - 46.0 % 32.6  35.7  37.6   Platelets 150 - 400 K/uL 67  53  61       Latest Ref Rng & Units 08/26/2023    2:20 PM 05/28/2023    8:30 PM 04/28/2023    1:57 PM  CMP  Glucose 70 - 99 mg/dL 401  027  253   BUN 8 - 23 mg/dL 16  14  18    Creatinine 0.44 - 1.00 mg/dL 6.64  4.03  4.74   Sodium 135 - 145 mmol/L 138  142  140   Potassium 3.5 - 5.1 mmol/L 3.7  3.1  3.9   Chloride 98 - 111 mmol/L 106  108  106   CO2 22 - 32 mmol/L 24  24  24    Calcium  8.9 - 10.3 mg/dL 9.1  9.0  25.9   Total Protein 6.5 - 8.1 g/dL 6.9  6.9  7.2   Total Bilirubin 0.0 - 1.2 mg/dL 0.6  0.6  0.6   Alkaline Phos 38 - 126 U/L 62  60  56   AST 15 - 41 U/L 22  24  24    ALT 0 - 44 U/L 13  16  18      RADIOGRAPHIC STUDIES: I have personally reviewed the radiological images as listed and agreed with the findings in the report. NM PET Image Restage (PS) Skull Base to Thigh (F-18 FDG) Result Date: 08/25/2023 CLINICAL DATA:  Subsequent treatment strategy for pros cancer. EXAM: NUCLEAR MEDICINE PET SKULL BASE TO THIGH TECHNIQUE: 9.9 mCi F-18 FDG was injected intravenously. Full-ring PET imaging was performed from the skull base to thigh after the radiotracer. CT data was obtained  and used for attenuation correction and anatomic localization. Fasting blood glucose: 111 mg/dl COMPARISON:  56/38/7564 FINDINGS: Mediastinal blood pool activity: SUV max 2.4 Liver activity: SUV max NA NECK: 0.7 cm with right level IIa lymph node with maximum SUV 5.2. Small scattered right level IIb and V lymph nodes including a 0.6 cm right level V lymph node on image 18 series 6 with maximum SUV 3.8. Incidental CT findings: None. CHEST: Soft tissue density favoring scarring along the lateral margin of the right pectoralis muscle and extending along the axilla adjacent to the inferior margin of the neurovascular structures, maximum SUV 2.6, probably therapy related. Incidental CT findings: Bilateral mastectomies. ABDOMEN/PELVIS: Portacaval node 1.1 cm in short axis  on image 84 series 6, maximum SUV 5.6. Scattered presumed physiologic activity in bowel. Small but hypermetabolic bilateral inguinal lymph nodes, left inguinal node 0.6 cm in short axis on image 131 series 6 with maximum SUV 5.7. Incidental CT findings: Progressive perihepatic ascites. Findings of portal venous hypertension including uphill varices adjacent to the distal esophagus and collateral vessels/varices in the gastrohepatic ligament. Progressive edema along the gastrohepatic ligament, peripancreatic region, and retroperitoneum. Mild perisplenic ascites along with splenomegaly. Nonobstructive bilateral nephrolithiasis. Atherosclerosis is present, including aortoiliac atherosclerotic disease. Small amount of pelvic ascites above the urinary bladder. 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 These lesions are mostly lytic. Incidental CT findings: None. IMPRESSION: 1. Scattered active hypermetabolic  foci of skeletal metastatic disease, corresponding to lytic lesions on CT. 2. Small but hypermetabolic right level IIA, right level IIb, right level V, and bilateral inguinal lymph nodes, suspicious for metastatic disease. 3. Progressive perihepatic ascites, splenomegaly, and findings of portal venous hypertension including upstream varices adjacent to the distal esophagus and collateral vessels/varices in the gastrohepatic ligament. Progressive edema along the gastrohepatic ligament, peripancreatic region, and retroperitoneum. 4. Nonobstructive bilateral nephrolithiasis. 5.  Aortic Atherosclerosis (ICD10-I70.0). Electronically Signed   By: Freida Jes M.D.   On: 08/25/2023 10:40

## 2023-08-26 NOTE — Assessment & Plan Note (Signed)
 Suspect underlying liver cirrhosis. Recommend patient to follow-up with gastroenterology.  She was previously seen by Dr. Antony Baumgartner

## 2023-08-27 LAB — CANCER ANTIGEN 27.29: CA 27.29: 176.8 U/mL — ABNORMAL HIGH (ref 0.0–38.6)

## 2023-08-27 LAB — CANCER ANTIGEN 15-3: CA 15-3: 170 U/mL — ABNORMAL HIGH (ref 0.0–25.0)

## 2023-08-27 NOTE — Telephone Encounter (Signed)
 Patient scheduled for Paracentesis on 530 @ 10a, arrive 9:30a. Pt's husband Sylvester Evert aware of appt details.

## 2023-08-28 ENCOUNTER — Other Ambulatory Visit: Payer: Self-pay | Admitting: Oncology

## 2023-08-28 ENCOUNTER — Ambulatory Visit
Admission: RE | Admit: 2023-08-28 | Discharge: 2023-08-28 | Disposition: A | Discharge status: Home / Self Care | Payer: MEDICAID | Source: Ambulatory Visit | Attending: Oncology | Admitting: Oncology

## 2023-08-28 DIAGNOSIS — R188 Other ascites: Secondary | ICD-10-CM | POA: Diagnosis present

## 2023-08-28 DIAGNOSIS — K766 Portal hypertension: Secondary | ICD-10-CM

## 2023-08-28 DIAGNOSIS — C50912 Malignant neoplasm of unspecified site of left female breast: Secondary | ICD-10-CM

## 2023-08-28 DIAGNOSIS — C50511 Malignant neoplasm of lower-outer quadrant of right female breast: Secondary | ICD-10-CM

## 2023-08-28 DIAGNOSIS — R591 Generalized enlarged lymph nodes: Secondary | ICD-10-CM

## 2023-08-28 DIAGNOSIS — D696 Thrombocytopenia, unspecified: Secondary | ICD-10-CM

## 2023-08-28 DIAGNOSIS — F323 Major depressive disorder, single episode, severe with psychotic features: Secondary | ICD-10-CM

## 2023-08-28 NOTE — Procedures (Signed)
 Patient presents for therapeutic and diagnostic paracentesis. US  limited abdomen shows no amount of peritoneal fluid noted.  Insufficient to perform a safe paracentesis. Procedure not performed.  Thank you for allowing our service to participate in [facility] 's care.  Electronically Signed: Pasty Bongo, PA-C   08/28/2023, 9:37 AM

## 2023-08-31 NOTE — Progress Notes (Signed)
 Sharon Cord, MD sent to [facility] Approved for US  guided bx of left inguinal LN.  Hx breast cancer.  HKM

## 2023-09-01 ENCOUNTER — Telehealth: Payer: Self-pay

## 2023-09-01 NOTE — Telephone Encounter (Signed)
 Referral faxed to [facility] for pt to re-establish care with Dr. Antony Baumgartner.   Re: portal hypertension, ascites  Fax confirmation received.

## 2023-09-02 ENCOUNTER — Encounter: Payer: Self-pay | Admitting: Oncology

## 2023-09-02 NOTE — Telephone Encounter (Signed)
 Patient scheduled for Biopsy on Friday 6/13 at 1P, arrive at 12:30 Pm. Spoke to pt' husband Sylvester Evert and informed him of this.

## 2023-09-08 ENCOUNTER — Other Ambulatory Visit: Payer: Self-pay | Admitting: Radiology

## 2023-09-10 NOTE — Progress Notes (Signed)
 Patient for US  guided Core LT inguinal LN Biopsy on Friday 09/11/23, I called and lvm for the patient on the phone and gave pre-procedure instructions. Vm made the patient aware to be here at 12:30p and check in at the [facility] registration desk. Called 09/10/23

## 2023-09-11 ENCOUNTER — Ambulatory Visit
Admission: RE | Admit: 2023-09-11 | Discharge: 2023-09-11 | Disposition: A | Discharge status: Home / Self Care | Payer: MEDICAID | Source: Ambulatory Visit | Attending: Oncology | Admitting: Oncology

## 2023-09-11 DIAGNOSIS — R591 Generalized enlarged lymph nodes: Secondary | ICD-10-CM

## 2023-09-11 DIAGNOSIS — C770 Secondary and unspecified malignant neoplasm of lymph nodes of head, face and neck: Secondary | ICD-10-CM | POA: Insufficient documentation

## 2023-09-11 MED ORDER — LIDOCAINE HCL (PF) 1 % IJ SOLN
10.0000 mL | Freq: Once | INTRAMUSCULAR | Status: AC
Start: 2023-09-11 — End: 2023-09-11
  Administered 2023-09-11: 10 mL via SUBCUTANEOUS
  Filled 2023-09-11: qty 10

## 2023-09-11 NOTE — Procedures (Signed)
 Pre procedural Dx: FDG avid right neck lymph node  Post procedural Dx: Same  Technically successful U/S guided biopsy of lymph node   EBL: None.   Complications: None immediate.   Zettie Hillock, MD Pager #: [phone]

## 2023-09-14 ENCOUNTER — Ambulatory Visit: Payer: Self-pay | Admitting: Oncology

## 2023-09-14 LAB — SURGICAL PATHOLOGY

## 2023-09-15 ENCOUNTER — Telehealth: Payer: Self-pay

## 2023-09-15 NOTE — Telephone Encounter (Signed)
-----   Message from Timmy Forbes sent at 09/14/2023 10:53 PM EDT ----- Please add on NGS on this specimen.  ----- Message ----- From: Interface, Lab In [facility] [place] [facility] Sent: 09/14/2023  10:48 AM EDT To: Timmy Forbes, MD

## 2023-09-15 NOTE — Telephone Encounter (Signed)
 Request for [facility] testing  (xR +xT) made on Lymph node specimen: [id number].

## 2023-09-15 NOTE — Telephone Encounter (Signed)
 Error

## 2023-09-23 ENCOUNTER — Telehealth: Payer: Self-pay | Admitting: Oncology

## 2023-09-23 ENCOUNTER — Inpatient Hospital Stay: Payer: MEDICAID

## 2023-09-23 ENCOUNTER — Inpatient Hospital Stay: Payer: MEDICAID | Attending: Radiation Oncology | Admitting: Oncology

## 2023-09-23 NOTE — Telephone Encounter (Signed)
 Pt missed md/injection appt today 09/23/23. I called pt and spoke with her to r/s. Appts are r/s and mailing AVS.

## 2023-09-29 ENCOUNTER — Inpatient Hospital Stay: Payer: MEDICAID | Attending: Radiation Oncology | Admitting: Oncology

## 2023-09-29 ENCOUNTER — Inpatient Hospital Stay: Payer: MEDICAID

## 2023-09-29 ENCOUNTER — Encounter: Payer: Self-pay | Admitting: Oncology

## 2023-09-29 ENCOUNTER — Other Ambulatory Visit: Payer: Self-pay

## 2023-09-29 VITALS — BP 138/88 | HR 84 | Temp 98.9°F | Resp 18

## 2023-09-29 DIAGNOSIS — Z170 Estrogen receptor positive status [ER+]: Secondary | ICD-10-CM | POA: Insufficient documentation

## 2023-09-29 DIAGNOSIS — G893 Neoplasm related pain (acute) (chronic): Secondary | ICD-10-CM | POA: Diagnosis not present

## 2023-09-29 DIAGNOSIS — D696 Thrombocytopenia, unspecified: Secondary | ICD-10-CM | POA: Insufficient documentation

## 2023-09-29 DIAGNOSIS — C50912 Malignant neoplasm of unspecified site of left female breast: Secondary | ICD-10-CM

## 2023-09-29 DIAGNOSIS — K766 Portal hypertension: Secondary | ICD-10-CM | POA: Diagnosis not present

## 2023-09-29 DIAGNOSIS — D72819 Decreased white blood cell count, unspecified: Secondary | ICD-10-CM | POA: Insufficient documentation

## 2023-09-29 DIAGNOSIS — E876 Hypokalemia: Secondary | ICD-10-CM | POA: Diagnosis not present

## 2023-09-29 DIAGNOSIS — Z5111 Encounter for antineoplastic chemotherapy: Secondary | ICD-10-CM | POA: Insufficient documentation

## 2023-09-29 DIAGNOSIS — Z9013 Acquired absence of bilateral breasts and nipples: Secondary | ICD-10-CM | POA: Insufficient documentation

## 2023-09-29 DIAGNOSIS — D72810 Lymphocytopenia: Secondary | ICD-10-CM

## 2023-09-29 DIAGNOSIS — C50812 Malignant neoplasm of overlapping sites of left female breast: Secondary | ICD-10-CM | POA: Insufficient documentation

## 2023-09-29 DIAGNOSIS — E119 Type 2 diabetes mellitus without complications: Secondary | ICD-10-CM | POA: Insufficient documentation

## 2023-09-29 DIAGNOSIS — C50511 Malignant neoplasm of lower-outer quadrant of right female breast: Secondary | ICD-10-CM | POA: Insufficient documentation

## 2023-09-29 DIAGNOSIS — K7460 Unspecified cirrhosis of liver: Secondary | ICD-10-CM | POA: Diagnosis not present

## 2023-09-29 DIAGNOSIS — F323 Major depressive disorder, single episode, severe with psychotic features: Secondary | ICD-10-CM

## 2023-09-29 LAB — CBC WITH DIFFERENTIAL (CANCER CENTER ONLY)
Abs Immature Granulocytes: 0 10*3/uL (ref 0.00–0.07)
Basophils Absolute: 0 10*3/uL (ref 0.0–0.1)
Basophils Relative: 1 %
Eosinophils Absolute: 0.1 10*3/uL (ref 0.0–0.5)
Eosinophils Relative: 2 %
HCT: 32.4 % — ABNORMAL LOW (ref 36.0–46.0)
Hemoglobin: 10.5 g/dL — ABNORMAL LOW (ref 12.0–15.0)
Immature Granulocytes: 0 %
Lymphocytes Relative: 19 %
Lymphs Abs: 0.4 10*3/uL — ABNORMAL LOW (ref 0.7–4.0)
MCH: 28.5 pg (ref 26.0–34.0)
MCHC: 32.4 g/dL (ref 30.0–36.0)
MCV: 87.8 fL (ref 80.0–100.0)
Monocytes Absolute: 0.2 10*3/uL (ref 0.1–1.0)
Monocytes Relative: 8 %
Neutro Abs: 1.5 10*3/uL — ABNORMAL LOW (ref 1.7–7.7)
Neutrophils Relative %: 70 %
Platelet Count: 72 10*3/uL — ABNORMAL LOW (ref 150–400)
RBC: 3.69 MIL/uL — ABNORMAL LOW (ref 3.87–5.11)
RDW: 14.8 % (ref 11.5–15.5)
WBC Count: 2.1 10*3/uL — ABNORMAL LOW (ref 4.0–10.5)
nRBC: 0 % (ref 0.0–0.2)

## 2023-09-29 LAB — CMP (CANCER CENTER ONLY)
ALT: 14 U/L (ref 0–44)
AST: 30 U/L (ref 15–41)
Albumin: 3.9 g/dL (ref 3.5–5.0)
Alkaline Phosphatase: 69 U/L (ref 38–126)
Anion gap: 7 (ref 5–15)
BUN: 14 mg/dL (ref 8–23)
CO2: 24 mmol/L (ref 22–32)
Calcium: 9 mg/dL (ref 8.9–10.3)
Chloride: 108 mmol/L (ref 98–111)
Creatinine: 0.78 mg/dL (ref 0.44–1.00)
GFR, Estimated: >60 mL/min (ref >60)
Glucose, Bld: 172 mg/dL — ABNORMAL HIGH (ref 70–99)
Potassium: 3.4 mmol/L — ABNORMAL LOW (ref 3.5–5.1)
Sodium: 139 mmol/L (ref 135–145)
Total Bilirubin: 0.9 mg/dL (ref 0.0–1.2)
Total Protein: 7.2 g/dL (ref 6.5–8.1)

## 2023-09-29 LAB — PROTIME-INR
INR: 1.1 (ref 0.8–1.2)
Prothrombin Time: 15.3 s — ABNORMAL HIGH (ref 11.4–15.2)

## 2023-09-29 LAB — APTT: aPTT: 30 s (ref 24–36)

## 2023-09-29 MED ORDER — FULVESTRANT 250 MG/5ML IM SOSY
500.0000 mg | PREFILLED_SYRINGE | Freq: Once | INTRAMUSCULAR | Status: AC
  Administered 2023-09-29: 500 mg via INTRAMUSCULAR
  Filled 2023-09-29: qty 10

## 2023-09-29 NOTE — Assessment & Plan Note (Signed)
 Due to splenomegaly.  17cm Previous bone marrow biopsy showed mildly hypercellular bone marrow with otherwise orderly trilineage hematopoiesis. A definitive morphologic etiology for this pancytopenia is not noted. Negative cytogenetics, negative myeloid FISH testing. Continue B12 supplementation.

## 2023-09-29 NOTE — Assessment & Plan Note (Signed)
 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  Drug induced leukopenia vs other etiologies.  Continue observation.

## 2023-09-29 NOTE — Assessment & Plan Note (Signed)
 Bilateral breast invasive lobular carcinoma, s/p bilateral mastectomy. [name]   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  PET scan showed progressive disease including scattered hypermetabolic bone lesions, small but hypermetabolic lymphadenopathy, concerning for disease progression. CT-guided right cervical lymphadenopathy pathology showed metastatic poorly differentiated carcinoma, with morphology consistent with breast primary.  NGS was sent, results are pending.  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  Previous NGS showed PIK3CA mutation.  Consider fulvestrant  with capivasertib

## 2023-09-29 NOTE — Progress Notes (Signed)
 [facility] has not received tissue specimen. Liquid biopsy (xF) sample collected today (09/29/23).   Financial assistance application completed and pt was approved. Out-of-pocket cost will be $0

## 2023-09-29 NOTE — Assessment & Plan Note (Signed)
 See above plan.

## 2023-09-29 NOTE — Progress Notes (Signed)
 Hematology/Oncology Progress note [phone] [phone]       CHIEF COMPLAINTS/REASON FOR VISIT:  bilateral breast cancer, thrombocytopenia, leukopenia  ASSESSMENT & PLAN:   Cancer Staging  Malignant neoplasm of left breast, stage 4 (HCC) Staging form: Breast, AJCC 8th Edition - Pathologic stage from 11/25/2021: Stage IB (pT2, pN1, cM0, G2, ER+, PR+, HER2-) - Signed by Babara Call, MD on 11/25/2021 - Pathologic stage from 07/25/2022: Stage IV (rpTX, rpNX, rpM1, ER+, PR: Not Assessed, HER2-) - Signed by Babara Call, MD on 08/14/2022  Malignant neoplasm of lower-outer quadrant of right breast of female, estrogen receptor positive (HCC) Staging form: Breast, AJCC 8th Edition - Pathologic stage from 11/25/2021: Stage IIIA (pT1c, pN3a, cM0, G2, ER+, PR+, HER2-) - Signed by Babara Call, MD on 11/25/2021   Malignant neoplasm of lower-outer quadrant of right breast of female, estrogen receptor positive (HCC) See above plan.  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 [name]   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  PET scan showed progressive disease including scattered hypermetabolic bone lesions, small but  hypermetabolic lymphadenopathy, concerning for disease progression. CT-guided right cervical lymphadenopathy pathology showed metastatic poorly differentiated carcinoma, with morphology consistent with breast primary.  NGS was sent, results are pending.  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  Previous NGS showed PIK3CA mutation.  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  17cm Previous bone marrow biopsy showed mildly hypercellular bone marrow with otherwise orderly trilineage hematopoiesis. A definitive morphologic etiology for this pancytopenia is not noted. Negative cytogenetics, negative myeloid FISH testing. Continue B12 supplementation.      Severe major depression, single episode, with psychotic features, mood-congruent (HCC) [name]    Portal hypertension (HCC) Previous image findings not consistent with liver cirrhosis. Recommend patient to follow-up with gastroenterology.  She was previously seen by Dr. Therisa I discussed case with Dr. Therisa.  Will obtain liver Doppler to rule out portal vein thrombosis.   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  Drug induced leukopenia vs other etiologies.  Continue observation.  Plan was discussed with patient and her husband  Orders Placed This Encounter  Procedures   US  LIVER DOPPLER    Standing Status:   Future    Expiration Date:   09/28/2024    Reason for Exam (SYMPTOM  OR DIAGNOSIS REQUIRED):   portal hypertension    Preferred imaging location?:   [facility]   CMP (Cancer Center only)    Standing Status:   Future     Number  of Occurrences:   1    Expected Date:   09/29/2023    Expiration Date:   09/28/2024   CBC with Differential (Cancer Center Only)    Standing Status:   Future    Number of Occurrences:   1    Expected Date:   09/29/2023    Expiration Date:   09/28/2024   Miscellaneous test (send-out)    Standing Status:   Future    Number of Occurrences:   1    Expected Date:   09/29/2023    Expiration Date:   09/28/2024    Test name / description::   TEMPUS   APTT    Standing Status:   Future    Number of Occurrences:   1    Expected Date:   09/29/2023    Expiration Date:   12/28/2023   Protime-INR    Standing Status:   Future    Number of Occurrences:   1    Expected Date:   09/29/2023    Expiration Date:   12/28/2023   Ambulatory referral to Gastroenterology    Referral Priority:   Routine    Referral Type:   Consultation    Referral Reason:   Specialty Services Required    Number of Visits Requested:   1   Follow up to be determined. [name], questions or concerns.  Sharon Cap, MD, PhD [facility] Hematology Oncology 09/29/2023     HISTORY OF PRESENTING ILLNESS:   is a  63 y.o.  female presents for follow up of bilateral breast cancer, thrombocytopenia and leukopenia.  Oncology History  Malignant neoplasm of left breast, stage 4 (HCC)  08/06/2021 Mammogram   08/06/2021, digital bilateral mammogram and ultrasound showed left breast 3:00 mass, 1.7 x 1.7 x 1.7 cm, ultrasound of the left axillary is negative. 08/21/2021 right breast 1 x 0.7 x 0.8 cm angulated spiculated mass at the right breast 7:00, 5 cm from nipple.  Ultrasound of the right axillary demonstrates 3 abnormal thickened cortex lymph node. 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 Lael Hines right breast 7:00, invasive mammary carcinoma with lobular features, in situ carcinoma,  ER+, PR+, HER 2-This was found to be concordant by Dr. Lael Hines. right axilla lymph node +, extracapsular extension.  -This was found to be concordant by Dr. Lael Hines   08/28/2021 Initial Diagnosis   Malignant neoplasm of upper-outer quadrant of left breast in female, estrogen receptor positive (HCC)   08/28/2021 Imaging   CT CHEST, ABDOMEN, AND PELVIS WITH CONTRAST Asymmetric mildly enlarged right axillary nodes. Correlate with biopsy. There are some punctate foci of sclerosis in the included spine that could reflect subtle metastatic disease. Nonobstructing bilateral renal calculi.   08/29/2021 Imaging   BILATERAL BREAST MRI WITH AND WITHOUT CONTRAST 1. 2.5 centimeter enhancing mass in the LOWER OUTER QUADRANT of the RIGHT breast, correlating with known malignancy. 2. At least 4 enlarged RIGHT axillary lymph nodes, correlating well with recently biopsied lymph node showing metastatic disease. 3. 3.4 centimeter enhancing mass in the LEFT breast, with an additional 2.9 centimeters of non mass enhancement posterior to the mass also suspicious for malignancy. This likely correlates with the area calcifications seen mammographically. 4. 5 millimeters satellite nodule along the LATERAL  aspect of known malignancy in the LOWER OUTER QUADRANT of the LEFT breast. 5. LEFT axilla is negative for adenopathy.       09/05/2021 Imaging   NUCLEAR MEDICINE WHOLE BODY BONE SCAN Uptake at L2 which is nonspecific but may be related to advanced degenerative disc and facet disease changes at both L1-L2 and L2-L3; no evidence of osseous metastatic disease by CT. No definite osseous metastatic lesions identified.    Genetic Testing   Negative genetic testing. No pathogenic variants identified on the [facility] CancerNext-Expanded+RNA panel. The report date is 10/03/2021.  The CancerNext-Expanded + RNAinsight gene  panel offered by [employer] and includes sequencing and rearrangement analysis for the following 77 genes: IP, ALK, APC*, ATM*, AXIN2, BAP1, BARD1, BLM, BMPR1A, BRCA1*, BRCA2*, BRIP1*, CDC73, CDH1*,CDK4, CDKN1B, CDKN2A, CHEK2*, CTNNA1, DICER1, FANCC, FH, FLCN, GALNT12, KIF1B, LZTR1, MAX, MEN1, MET, MLH1*, MSH2*, MSH3, MSH6*, MUTYH*, NBN, NF1*, NF2, NTHL1, PALB2*, PHOX2B, PMS2*, POT1, PRKAR1A, PTCH1, PTEN*, RAD51C*, RAD51D*,RB1, RECQL, RET, SDHA, SDHAF2, SDHB, SDHC, SDHD, SMAD4, SMARCA4, SMARCB1, SMARCE1, STK11, SUFU, TMEM127, TP53*,TSC1, TSC2, VHL and XRCC2 (sequencing and deletion/duplication); EGFR, EGLN1, HOXB13, KIT, MITF, PDGFRA, POLD1 and POLE (sequencing only); EPCAM and GREM1 (deletion/duplication only).   11/15/2021 Surgery   She underwent left simple mastectomy and right modified mastectomy.   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  pT2 pN1a, ER +90%, PR +90%, HER2 negative (1+)  # Right Breast invasive lobular carcinoma Grade 2, negative margins, right axillary lymph nodes  30/32 involved with metastatic carcinoma, pT1c pN3a ER +90%, PR +51-90%, HER2 negative (1+)    11/25/2021 Cancer Staging   Staging form: Breast, AJCC 8th Edition - Pathologic stage from 11/25/2021: Stage IB (pT2, pN1, cM0, G2, ER+, PR+, HER2-) - Signed by Babara Call, MD on 11/25/2021 Stage prefix: Initial diagnosis Multigene prognostic tests performed: None Histologic grading system: 3 grade system   01/09/2022 - 03/10/2022 Radiation Therapy   Adjuvant breast Radiation.    07/25/2022 Cancer Staging   Staging form: Breast, AJCC 8th Edition - Pathologic stage from 07/25/2022: Stage IV (rpTX, pNX, pM1, ER+, PR: Not Assessed, HER2-) - Signed by Babara Call, MD on 08/14/2022 Stage prefix:  Recurrence Multigene prognostic tests performed: None   07/25/2022 Procedure   Colonoscopy showed Findings - Six 5 to 6 mm polyps in the ascending colon, removed with a cold snare. Resected and retrieved. - One 10 mm polyp in the descending colon, removed with a cold snare. Resected and retrieved. Clip was placed. - One 30 mm polyp in the rectum, removed with a hot snare. Resected and retrieved. Clips were placed.  Pathology showed A.  COLON POLYP X 6, ASCENDING AND CECUM; COLD SNARE: - COLONIC MUCOSA WITH METASTATIC LOBULAR BREAST CARCINOMA (3) - HYPERPLASTIC POLYP (3)  B.  COLON POLYP, DESCENDING; COLD SNARE: - TUBULAR ADENOMA. - NEGATIVE FOR HIGH-GRADE DYSPLASIA AND MALIGNANCY.  C.  RECTUM POLYP; HOT SNARE: - TUBULOVILLOUS ADENOMA. - NEGATIVE FOR HIGH-GRADE DYSPLASIA AND MALIGNANCY.  Comment: The metastatic lobular breast carcinoma is positive for GATA-3 and ER (strong staining in greater than 90% of the tumor cells). HER2 negative (1+)     08/06/2022 Imaging   PET  Status post bilateral mastectomy with radiation changes in the anterior upper lobes.   No findings suspicious for recurrent or metastatic disease   09/18/2022 Imaging   MRI brain w wo  1. No evidence of intracranial metastatic disease. 2. Mild multifocal T2 FLAIR hyperintense signal abnormality within the cerebral white matter, nonspecific but most often secondary to chronic small vessel ischemia. 3. Paranasal sinus disease as described.   01/27/2023 Imaging   US  left axillary No sonographic evidence of malignancy at the site of palpable concern in the LEFT axilla.    04/22/2023 Imaging   CT chest abdomen pelvis w contrast showed 1. New right chest wall soft tissue lesion which may represent locally recurrent or metastatic disease. Attention on follow-up. 2. New right seventh rib sclerotic osseous lesion. 3. Splenomegaly.    05/11/2023 Procedure   CT guided biopsy of right axillary nodule   BENIGN  FIBROVASCULAR TISSUE.       NO LYMPHOID TISSUE IDENTIFIED.       NEGATIVE FOR MALIGNANCY.    08/19/2023 Imaging   PET scan showed 1. 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 2. Small but hypermetabolic right level IIA, right level IIb, right level V, and bilateral inguinal lymph nodes, suspicious for metastatic disease. 3. Progressive perihepatic ascites, splenomegaly, and findings of portal venous hypertension including upstream varices adjacent to the distal esophagus and collateral vessels/varices in the gastrohepatic ligament. Progressive edema along the gastrohepatic ligament, peripancreatic region, and retroperitoneum. 4. Nonobstructive bilateral nephrolithiasis. 5.  Aortic Atherosclerosis (ICD10-I70.0).     09/11/2023 Procedure   1. Lymph node, needle/core biopsy, right neck :      -  METASTATIC POORLY DIFFERENTIATED CARCINOMA MORPHOLOGIC CONSISTENT WITH THE      CLINICAL HISTORY OF BREAST PRIMARY (HISTORY OF INVASIVE LOBULAR CARCINOMA OF      BILATERAL BREAST).      NOTE: CLINICAL HISTORY OF STAGE IV BREAST CARCINOMA (INVASIVE LOBULAR OF BOTH      LEFT AND RIGHT) IS NOTED.  THE FINDINGS ARE MORPHOLOGICALLY CONSISTENT WITH      METASTATIC BREAST CARCINOMA.  NOTE CONFIRMATORY IMMUNOHISTOCHEMICAL STAINS ARE PERFORMED GIVEN THE MORPHOLOGIC IMPRESSION AND TO PRESERVE TISSUE FOR CLINICIAN  DIRECTED TESTING.    Malignant neoplasm of lower-outer quadrant of right breast of female, estrogen receptor positive (HCC)  08/06/2021 Mammogram   08/06/2021, digital bilateral mammogram and ultrasound showed left breast 3:00 mass, 1.7 x 1.7 x 1.7 cm, ultrasound of the left axillary is negative. 08/21/2021 right breast 1 x 0.7 x 0.8 cm angulated spiculated mass at the right breast 7:00, 5 cm from nipple.  Ultrasound of the right axillary demonstrates 3 abnormal thickened cortex lymph node. Patient was recommended to proceed with  biopsy left breast 3:00 invasive mammry carcinoma with lobular features. in situ carcinoma, lobular neoplasia. 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 Lael Hines right breast 7:00, invasive mammary carcinoma with lobular features, in situ carcinoma,  ER+, PR+, HER 2-This was found to be concordant by Dr. Lael Hines. right axilla lymph node +, extracapsular extension.  -This was found to be concordant by Dr. Lael Hines   08/28/2021 Initial Diagnosis   Malignant neoplasm of lower-outer quadrant of right breast of female, estrogen receptor positive (HCC)   08/28/2021 Imaging   CT CHEST, ABDOMEN, AND PELVIS WITH CONTRAST Asymmetric mildly enlarged right axillary nodes. Correlate with biopsy. There are some punctate foci of sclerosis in the included spine that could reflect subtle metastatic disease. Nonobstructing bilateral renal calculi.   08/29/2021 Imaging   BILATERAL BREAST MRI WITH AND WITHOUT CONTRAST 1. 2.5 centimeter enhancing mass in the LOWER OUTER QUADRANT of  the RIGHT breast, correlating with known malignancy. 2. At least 4 enlarged RIGHT axillary lymph nodes, correlating well with recently biopsied lymph node showing metastatic disease. 3. 3.4 centimeter enhancing mass in the LEFT breast, with an additional 2.9 centimeters of non mass enhancement posterior to the mass also suspicious for malignancy. This likely correlates with the area calcifications seen mammographically. 4. 5 millimeters satellite nodule along the LATERAL aspect of known malignancy in the LOWER OUTER QUADRANT of the LEFT breast. 5. LEFT axilla is negative for adenopathy.       09/05/2021 Imaging   NUCLEAR MEDICINE WHOLE BODY BONE SCAN Uptake at L2 which is nonspecific but may be related to advanced degenerative disc and facet disease changes at both L1-L2 and L2-L3; no evidence of osseous metastatic disease by CT. No definite osseous metastatic lesions identified.   09/09/2021 Oncotype  testing   [id number] block RIGHT 7:00 5 CM FN; ULTRASOUND-GUIDED BIOPSY Oncotype Dx RS score 22    Genetic Testing   Negative genetic testing. No pathogenic variants identified on the [facility] CancerNext-Expanded+RNA panel. The report date is 10/03/2021.  The CancerNext-Expanded + RNAinsight gene panel offered by [employer] and includes sequencing and rearrangement analysis for the following 77 genes: IP, ALK, APC*, ATM*, AXIN2, BAP1, BARD1, BLM, BMPR1A, BRCA1*, BRCA2*, BRIP1*, CDC73, CDH1*,CDK4, CDKN1B, CDKN2A, CHEK2*, CTNNA1, DICER1, FANCC, FH, FLCN, GALNT12, KIF1B, LZTR1, MAX, MEN1, MET, MLH1*, MSH2*, MSH3, MSH6*, MUTYH*, NBN, NF1*, NF2, NTHL1, PALB2*, PHOX2B, PMS2*, POT1, PRKAR1A, PTCH1, PTEN*, RAD51C*, RAD51D*,RB1, RECQL, RET, SDHA, SDHAF2, SDHB, SDHC, SDHD, SMAD4, SMARCA4, SMARCB1, SMARCE1, STK11, SUFU, TMEM127, TP53*,TSC1, TSC2, VHL and XRCC2 (sequencing and deletion/duplication); EGFR, EGLN1, HOXB13, KIT, MITF, PDGFRA, POLD1 and POLE (sequencing only); EPCAM and GREM1 (deletion/duplication only).   11/15/2021 Surgery   She underwent left simple mastectomy and right modified mastectomy.   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  pT2 pN1a, ER +90%, PR +90%, HER2 negative (1+)  # Right Breast invasive lobular carcinoma Grade 2, negative margins, right axillary lymph nodes  30/32 involved with metastatic carcinoma, pT1c pN3a ER +90%, PR +51-90%, HER2 negative (1+)    11/25/2021 Cancer Staging   Staging form: Breast, AJCC 8th Edition - Pathologic stage from 11/25/2021: Stage IIIA (pT1c, pN3a, cM0, G2, ER+, PR+, HER2-) - Signed by Babara Call, MD on 11/25/2021 Stage prefix: Initial diagnosis Multigene prognostic tests performed: None Histologic grading system: 3 grade system    12/10/2021 Imaging   PET scan  1. Interval bilateral mastectomy and right axillary node dissection.No evidence of residual disease in the chest wall, nodal metastases or distant metastases. 2. Focal hypermetabolic activity in the proximal rectum corresponding with an intraluminal polypoid lesion, suspicious for a villous adenoma or early colon cancer. Sigmoidoscopy/colonoscopy recommended unless recently performed   12/10/2021 Imaging   1. Interval bilateral mastectomy and right axillary node dissection.No evidence of residual disease in the chest wall, nodal metastasesor distant metastases. 2. Focal hypermetabolic activity in the proximal rectum corresponding with an intraluminal polypoid lesion, suspicious for a villous adenoma or early colon cancer. Sigmoidoscopy/colonoscopy recommended unless recently performed    She is a poor historian. History of major depression, psychosis, previously on olanzapine  and Remeron  not currently on any and if not currently following up with psychiatrist. Patient's family history is positive for sister with breast cancer   INTERVAL HISTORY Sharon Woods is a 63  y.o. female who has above history reviewed by me today presents for follow up visit for management of bilateral breast cancer Patient reports increasing abdomen distention since May 2025.  Poor historian.  She did report some lower abdominal discomfort.  She is accompanied by her husband.      Review of Systems  Constitutional:  Negative for appetite change, chills, fatigue and fever.  HENT:   Negative for hearing loss and voice change.   Eyes:  Negative for eye problems.  Respiratory:  Negative for chest tightness and cough.   Cardiovascular:  Negative for chest pain.  Gastrointestinal:  Positive for abdominal distention. Negative for abdominal pain and blood in stool.  Endocrine: Negative for hot flashes.  Genitourinary:  Negative for difficulty urinating and frequency.    Musculoskeletal:  Negative for arthralgias.  Skin:  Negative for itching and rash.  Neurological:  Negative for extremity weakness.  Hematological:  Negative for adenopathy.  Psychiatric/Behavioral:  Negative for confusion.     MEDICAL HISTORY:  Past Medical History:  Diagnosis Date   Anxiety    Cancer (HCC)    Depression    Diabetes mellitus without complication (HCC)    History of high cholesterol 2000   Hyperlipidemia    Hypertension    Patient denies medical problems    Thrombocytopenia (HCC)     SURGICAL HISTORY: Past Surgical History:  Procedure Laterality Date   ABDOMINAL HYSTERECTOMY     AXILLARY SENTINEL NODE BIOPSY Left 11/15/2021   Procedure: AXILLARY SENTINEL NODE BIOPSY;  Surgeon: Lane Shope, MD;  Location: [facility] ORS;  Service: General;  Laterality: Left;   BREAST BIOPSY Right 08/21/2021   us  bx 7:00 mass coil clip path pending   BREAST BIOPSY Right 08/21/2021   us  bx of LN, hydro marker, path pending   BREAST BIOPSY Left 08/21/2021   us  bx, heart marker, path pending   CESAREAN SECTION     x2   COLONOSCOPY WITH PROPOFOL  N/A 07/25/2022   Procedure: COLONOSCOPY WITH PROPOFOL ;  Surgeon: Therisa Bi, MD;  Location: [facility] ENDOSCOPY;  Service: Gastroenterology;  Laterality: N/A;   MASTECTOMY     MASTECTOMY MODIFIED RADICAL Right 11/15/2021   Procedure: MASTECTOMY MODIFIED RADICAL;  Surgeon: Lane Shope, MD;  Location: [facility] ORS;  Service: General;  Laterality: Right;   NO PAST SURGERIES     TOTAL MASTECTOMY Left 11/15/2021   Procedure: TOTAL MASTECTOMY;  Surgeon: Lane Shope, MD;  Location: [facility] ORS;  Service: General;  Laterality: Left;    SOCIAL HISTORY: Social History   Socioeconomic History   Marital status: Married    Spouse name: Not on file   Number of children: Not on file   Years of education: Not on file   Highest education level: Not on file  Occupational History   Not on file  Tobacco Use   Smoking status: Former    Current  packs/day: 0.00    Types: Cigarettes    Quit date: 2013    Years since quitting: 12.5    Passive exposure: Current   Smokeless tobacco: Never  Vaping Use   Vaping status: Never Used  Substance and Sexual Activity   Alcohol use: Not Currently    Comment: occasionally   Drug use: No   Sexual activity: Not Currently    Comment: unable to assess   Other Topics Concern   Not on file  Social History Narrative   Not on file   Social Drivers of Health   Financial Resource Strain: Medium Risk (  10/10/2021)   Overall Financial Resource Strain (CARDIA)    Difficulty of Paying Living Expenses: Somewhat hard  Food Insecurity: No Food Insecurity (10/10/2021)   Hunger Vital Sign    Worried About Running Out of Food in the Last Year: Never true    Ran Out of Food in the Last Year: Never true  Transportation Needs: Unmet Transportation Needs (01/21/2022)   PRAPARE - Administrator, Civil Service (Medical): Yes    Lack of Transportation (Non-Medical): Yes  Physical Activity: Inactive (10/10/2021)   Exercise Vital Sign    Days of Exercise per Week: 0 days    Minutes of Exercise per Session: 0 min  Stress: Stress Concern Present (10/10/2021)   [employer] of Occupational Health - Occupational Stress Questionnaire    Feeling of Stress : To some extent  Social Connections: Socially Isolated (10/10/2021)   Social Connection and Isolation Panel    Frequency of Communication with Friends and Family: Once a week    Frequency of Social Gatherings with Friends and Family: Never    Attends Religious Services: Never    Diplomatic Services operational officer: No    Attends Engineer, structural: Never    Marital Status: Married  Catering manager Violence: Not on file    FAMILY HISTORY: Family History  Problem Relation Age of Onset   Lung cancer Mother    Dementia Mother    Parkinson's disease Father    Cancer Father        unk type   Diabetes Sister    Heart attack  Sister    Breast cancer Sister    Cancer Maternal Uncle        unk types   Dementia Maternal Grandmother    Cancer Maternal Grandmother        unk type   Cancer Other    Dementia Other     ALLERGIES:  is allergic to tylenol  [acetaminophen ] and aleve [naproxen].  MEDICATIONS:  Current Outpatient Medications  Medication Sig Dispense Refill   ACCU-CHEK GUIDE test strip USE TO CHECK BLOOD SUGAR UP TO 4 TIMES DAILY AS DIRECTED 100 each 0   Accu-Chek Softclix Lancets lancets USE TO CHECK BLOOD SUAGR UP TO 4 TIMES DAILY AS DIRECTED 100 each 0   anastrozole  (ARIMIDEX ) 1 MG tablet Take 1 tablet by mouth once daily 90 tablet 0   blood glucose meter kit and supplies KIT Dispense based on patient and insurance preference. Use up to four times daily as directed. 1 each 1   calcium  carbonate (OS-CAL) 600 MG TABS tablet Take 2 tablets (1,200 mg total) by mouth daily. 60 tablet 5   cyanocobalamin  (VITAMIN B12) 1000 MCG tablet Take 1 tablet (1,000 mcg total) by mouth daily. 30 tablet 2   escitalopram  (LEXAPRO ) 20 MG tablet Take 1 tablet (20 mg total) by mouth daily. 90 tablet 1   famotidine  (PEPCID ) 40 MG tablet Take 1 tablet (40 mg total) by mouth daily. 90 tablet 1   hydrOXYzine  (ATARAX ) 10 MG tablet TAKE 1 TABLET BY MOUTH TWICE DAILY AS NEEDED FOR ITCHING OR ANXIETY 180 tablet 0   metFORMIN  (GLUCOPHAGE ) 500 MG tablet Take 1 tablet (500 mg total) by mouth 2 (two) times daily with a meal. 180 tablet 1   rosuvastatin  (CRESTOR ) 5 MG tablet Take 1 tablet by mouth once daily 90 tablet 1   triamcinolone  ointment (KENALOG ) 0.5 % Apply 1 Application topically 2 (two) times daily. 30 g 1   Current  Facility-Administered Medications  Medication Dose Route Frequency Provider Last Rate Last Admin   cefTRIAXone  (ROCEPHIN ) injection 500 mg  500 mg Intramuscular Once Bernardo Fend, DO         PHYSICAL EXAMINATION: ECOG PERFORMANCE STATUS: 1 - Symptomatic but completely ambulatory Vitals:   09/29/23 1423   BP: 138/88  Pulse: 84  Resp: 18  Temp: 98.9 F (37.2 C)    There were no vitals filed for this visit.    Physical Exam Constitutional:      General: She is not in acute distress. HENT:     Head: Normocephalic and atraumatic.  Eyes:     General: No scleral icterus. Cardiovascular:     Rate and Rhythm: Normal rate.  Pulmonary:     Effort: Pulmonary effort is normal. No respiratory distress.     Breath sounds: No wheezing.  Abdominal:     General: There is distension.  Musculoskeletal:        General: No deformity. Normal range of motion.     Cervical back: Normal range of motion and neck supple.  Skin:    Coloration: Skin is not jaundiced.  Neurological:     Mental Status: She is alert and oriented to person, place, and time. Mental status is at baseline.     LABORATORY DATA:  I have reviewed the data as listed    Latest Ref Rng & Units 09/29/2023    3:25 PM 08/26/2023    2:20 PM 05/28/2023    8:30 PM  CBC  WBC 4.0 - 10.5 K/uL 2.1  2.4  2.4   Hemoglobin 12.0 - 15.0 g/dL 89.4  89.4  88.1   Hematocrit 36.0 - 46.0 % 32.4  32.6  35.7   Platelets 150 - 400 K/uL 72  67  53       Latest Ref Rng & Units 09/29/2023    3:25 PM 08/26/2023    2:20 PM 05/28/2023    8:30 PM  CMP  Glucose 70 - 99 mg/dL 827  874  813   BUN 8 - 23 mg/dL 14  16  14    Creatinine 0.44 - 1.00 mg/dL 9.21  9.24  9.32   Sodium 135 - 145 mmol/L 139  138  142   Potassium 3.5 - 5.1 mmol/L 3.4  3.7  3.1   Chloride 98 - 111 mmol/L 108  106  108   CO2 22 - 32 mmol/L 24  24  24    Calcium  8.9 - 10.3 mg/dL 9.0  9.1  9.0   Total Protein 6.5 - 8.1 g/dL 7.2  6.9  6.9   Total Bilirubin 0.0 - 1.2 mg/dL 0.9  0.6  0.6   Alkaline Phos 38 - 126 U/L 69  62  60   AST 15 - 41 U/L 30  22  24    ALT 0 - 44 U/L 14  13  16      RADIOGRAPHIC STUDIES: I have personally reviewed the radiological images as listed and agreed with the findings in the report. US  CORE BIOPSY (LYMPH NODES) Result Date: 09/11/2023 INDICATION:  Enlarged right neck lymph node EXAM: ULTRASOUND GUIDED core needle BIOPSY OF cervical lymph node MEDICATIONS: None. ANESTHESIA/SEDATION: 1% lidocaine  PROCEDURE: The procedure, risks, benefits, and alternatives were explained to the patient. Questions regarding the procedure were encouraged and answered. The patient understands and consents to the procedure. The operative field was prepped with chlorhexidine  in a sterile fashion, and a sterile drape was applied covering the operative field. A  sterile gown and sterile gloves were used for the procedure. Local anesthesia was provided with 1% Lidocaine . COMPLICATIONS: None immediate. FINDINGS: With patient in a supine position head of bed elevated proximally 45 degrees, the large neck lymph node was identified. Patient's skin was marked, prepped, and draped in the usual sterile fashion. Anesthesia was achieved by infiltrating 1% lidocaine  into the soft tissue adjacent to the lymph node. Although enlarged, the lymph node was highly mobile and difficult to engage with the biopsy needle. Once his tissue was engaged, a core needle biopsy was obtained. Two total biopsies performed. Biopsies were performed by advancing the biopsy needle through a small incision in the patient's skin until the biopsy needle was visualized within the lymph node. 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 EXAM: ULTRASOUND ABDOMEN LIMITED FINDINGS: Imaging of all 4 quadrants of the abdomen reveals only small volume perisplenic ascites. There is no safe window for paracentesis. IMPRESSION: Only trace perisplenic ascites.  Paracentesis was deferred. Performed By Lavanda Jurist, PA-C Electronically Signed   By: Wilkie Lent M.D.   On:  08/28/2023 10:24   NM PET Image Restage (PS) Skull Base to Thigh (F-18 FDG) Result Date: 08/25/2023 CLINICAL DATA:  Subsequent treatment strategy for pros cancer. EXAM: NUCLEAR MEDICINE PET SKULL BASE TO THIGH TECHNIQUE: 9.9 mCi F-18 FDG was injected intravenously. Full-ring PET imaging was performed from the skull base to thigh after the radiotracer. CT data was obtained and used for attenuation correction and anatomic localization. Fasting blood glucose: 111 mg/dl COMPARISON:  94/91/7975 FINDINGS: Mediastinal blood pool activity: SUV max 2.4 Liver activity: SUV max NA NECK: 0.7 cm with right level IIa lymph node with maximum SUV 5.2. Small scattered right level IIb and V lymph nodes including a 0.6 cm right level V lymph node on image 18 series 6 with maximum SUV 3.8. Incidental CT findings: None. CHEST: Soft tissue density favoring scarring along the lateral margin of the right pectoralis muscle and extending along the axilla adjacent to the inferior margin of the neurovascular structures, maximum SUV 2.6, probably therapy related. Incidental CT findings: Bilateral mastectomies. ABDOMEN/PELVIS: Portacaval node 1.1 cm in short axis on image 84 series 6, maximum SUV 5.6. Scattered presumed physiologic activity in bowel. Small but hypermetabolic bilateral inguinal lymph nodes, left inguinal node 0.6 cm in short axis on image 131 series 6 with maximum SUV 5.7. Incidental CT findings: Progressive perihepatic ascites. Findings of portal venous hypertension including uphill varices adjacent to the distal esophagus and collateral vessels/varices in the gastrohepatic ligament. Progressive edema along the gastrohepatic ligament, peripancreatic region, and retroperitoneum. Mild perisplenic ascites along with splenomegaly. Nonobstructive bilateral nephrolithiasis. Atherosclerosis is present, including aortoiliac atherosclerotic disease. Small amount of pelvic ascites above the urinary bladder. SKELETON: Scattered  hypermetabolic foci of skeletal metastatic disease noted, including the left anterior ring of C1 (maximum SUV 4.0), left eighth rib laterally (maximum SUV 8.6) anterior L3 vertebral body (maximum SUV 7.3), and left posterior acetabulum (maximum SUV 9.9), among other sites. These lesions are mostly lytic. Incidental CT findings: None. IMPRESSION: 1. 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 2. Small but hypermetabolic right level IIA, right level IIb, right level V, and bilateral inguinal lymph nodes, suspicious for metastatic disease. 3. Progressive perihepatic ascites,  splenomegaly, and findings of portal venous hypertension including upstream varices adjacent to the distal esophagus and collateral vessels/varices in the gastrohepatic ligament. Progressive edema along the gastrohepatic ligament, peripancreatic region, and retroperitoneum. 4. Nonobstructive bilateral nephrolithiasis. 5.  Aortic Atherosclerosis (ICD10-I70.0). Electronically Signed   By: Ryan Salvage M.D.   On: 08/25/2023 10:40

## 2023-09-29 NOTE — Assessment & Plan Note (Signed)
 Attempted paracentesis, not successful due to small amount

## 2023-09-29 NOTE — Assessment & Plan Note (Signed)
 [name]

## 2023-09-29 NOTE — Progress Notes (Signed)
 Pt here for follow up. Pt reports that she has been having some ear discomfort. Pt also requesting refill on Anastrozole .

## 2023-09-29 NOTE — Assessment & Plan Note (Addendum)
 Previous image findings not consistent with liver cirrhosis. Recommend patient to follow-up with gastroenterology.   PT/PTT I discussed case with Dr. Therisa.  Will obtain liver Doppler to rule out portal vein thrombosis.

## 2023-09-30 ENCOUNTER — Encounter: Payer: Self-pay | Admitting: Oncology

## 2023-09-30 ENCOUNTER — Telehealth: Payer: Self-pay | Admitting: Pharmacist

## 2023-09-30 ENCOUNTER — Other Ambulatory Visit (HOSPITAL_COMMUNITY): Payer: Self-pay

## 2023-09-30 ENCOUNTER — Telehealth: Payer: Self-pay | Admitting: Pharmacy Technician

## 2023-09-30 ENCOUNTER — Ambulatory Visit: Admission: RE | Admit: 2023-09-30 | Payer: MEDICAID | Source: Ambulatory Visit

## 2023-09-30 DIAGNOSIS — C50912 Malignant neoplasm of unspecified site of left female breast: Secondary | ICD-10-CM

## 2023-09-30 NOTE — Telephone Encounter (Unsigned)
 Clinical Pharmacist Practitioner Encounter   Received new prescription for Truqap (capivasertib) for the treatment of stage IV ER/PR positive, HER2 negative, PIK3CA mutation positive breast cancer in conjunction with fulvestrant , planned duration until disease progression or unacceptable drug toxicity.  Patient has known diabetes and is currently on metformin . A1c from 07/23/23 was 6.6%.   Prescription dose and frequency assessed.   Current medication list in Epic reviewed, no DDIs with capivasertib identified.   Evaluated chart and no patient barriers to medication adherence identified.   Prescription has been e-scribed to the [facility] for benefits analysis and approval.  Oral Oncology Clinic will continue to follow for insurance authorization, copayment issues, initial counseling and start date. .  Ranette Luckadoo N. Yanis Juma, PharmD, NEILA, CPP Hematology/Oncology Clinical Pharmacist [facility] Oral Chemotherapy Navigation Clinic [phone]  09/30/2023 9:52 AM

## 2023-09-30 NOTE — Telephone Encounter (Signed)
 Oral Oncology Patient Advocate Encounter   Received notification that prior authorization for Truqap is required.   PA submitted on 09/30/2023 Key [id number] Status is pending     Negar Sieler (Patty) Chet Burnet, CPhT  [facility], [place], [place], [place] Oral Chemotherapy Patient Advocate Phone: [phone]  Fax: [phone]

## 2023-10-01 ENCOUNTER — Encounter: Payer: Self-pay | Admitting: Oncology

## 2023-10-01 ENCOUNTER — Other Ambulatory Visit: Payer: Self-pay

## 2023-10-01 ENCOUNTER — Telehealth: Payer: Self-pay | Admitting: Pharmacy Technician

## 2023-10-01 ENCOUNTER — Other Ambulatory Visit (HOSPITAL_COMMUNITY): Payer: Self-pay

## 2023-10-01 NOTE — Telephone Encounter (Signed)
 Oral Oncology Patient Advocate Encounter  Received faxed stating Truqap tablets is formulary under patient's prescription insurance.  Pharmacy is only able to get in [facility] for Truqap pack.  Will resubmit PA for pack.  Sharon Woods (Patty) Chet Burnet, CPhT  [facility], [place], [place], [place] Oral Chemotherapy Patient Advocate Phone: [phone]  Fax: [phone]

## 2023-10-01 NOTE — Telephone Encounter (Signed)
 Oral Oncology Patient Advocate Encounter   Received notification that prior authorization for Truqap 160MG  tablet therapy pack is required.   PA submitted on 10/01/2023 Key [id number] Status is pending     Pasqualino Witherspoon (Patty) Chet Burnet, CPhT  [facility], [place], [place], [place] Oral Chemotherapy Patient Advocate Phone: [phone]  Fax: [phone]

## 2023-10-05 ENCOUNTER — Encounter: Payer: Self-pay | Admitting: Oncology

## 2023-10-05 ENCOUNTER — Other Ambulatory Visit (HOSPITAL_COMMUNITY): Payer: Self-pay

## 2023-10-05 ENCOUNTER — Ambulatory Visit
Admission: RE | Admit: 2023-10-05 | Discharge: 2023-10-05 | Disposition: A | Discharge status: Home / Self Care | Payer: MEDICAID | Source: Ambulatory Visit | Attending: Oncology | Admitting: Oncology

## 2023-10-05 ENCOUNTER — Inpatient Hospital Stay: Payer: MEDICAID

## 2023-10-05 ENCOUNTER — Other Ambulatory Visit: Payer: Self-pay

## 2023-10-05 DIAGNOSIS — R319 Hematuria, unspecified: Secondary | ICD-10-CM

## 2023-10-05 DIAGNOSIS — K766 Portal hypertension: Secondary | ICD-10-CM | POA: Insufficient documentation

## 2023-10-05 DIAGNOSIS — Z5111 Encounter for antineoplastic chemotherapy: Secondary | ICD-10-CM | POA: Diagnosis not present

## 2023-10-05 LAB — URINALYSIS, COMPLETE (UACMP) WITH MICROSCOPIC
Bilirubin Urine: NEGATIVE
Glucose, UA: NEGATIVE mg/dL
Ketones, ur: NEGATIVE mg/dL
Nitrite: NEGATIVE
Protein, ur: 100 mg/dL — AB
RBC / HPF: >50 RBC/hpf (ref 0–5)
Specific Gravity, Urine: 1.018 (ref 1.005–1.030)
WBC, UA: >50 WBC/hpf (ref 0–5)
pH: 8 (ref 5.0–8.0)

## 2023-10-05 MED ORDER — CAPIVASERTIB 160 MG PO TBPK
320.0000 mg | ORAL_TABLET | Freq: Two times a day (BID) | ORAL | 0 refills | Status: DC
  Filled 2023-10-06: qty 64, 28d supply, fill #0

## 2023-10-05 NOTE — Telephone Encounter (Signed)
 Oral Oncology Patient Advocate Encounter  Prior Authorization for Truqap  has been approved.    PA# [id number] Effective dates: 10/02/2023 through 10/01/2024  Patients co-pay is $0.    [place] (Patty) Chet Burnet, CPhT  [facility], [place], [place], [place] Oral Chemotherapy Patient Advocate Phone: [phone]  Fax: [phone]

## 2023-10-06 ENCOUNTER — Other Ambulatory Visit: Payer: Self-pay | Admitting: Pharmacy Technician

## 2023-10-06 ENCOUNTER — Other Ambulatory Visit (HOSPITAL_COMMUNITY): Payer: Self-pay

## 2023-10-06 ENCOUNTER — Other Ambulatory Visit: Payer: Self-pay

## 2023-10-06 ENCOUNTER — Telehealth: Payer: Self-pay | Admitting: Pharmacy Technician

## 2023-10-06 ENCOUNTER — Telehealth: Payer: Self-pay

## 2023-10-06 LAB — URINE CULTURE: Culture: <10000 — AB

## 2023-10-06 MED ORDER — ONDANSETRON HCL 8 MG PO TABS: 8.0000 mg | ORAL_TABLET | Freq: Three times a day (TID) | ORAL | 2 refills | Status: AC | PRN

## 2023-10-06 NOTE — Progress Notes (Signed)
 Specialty Pharmacy Initial Fill Coordination Note  Sharon Woods is a 63 y.o. female contacted today regarding refills of specialty medication(s) Capivasertib  (TRUQAP ) .  Patient requested Delivery  on 10/08/23  to verified address [address] [place] [address]   Medication will be filled on 07/09.   Patient is aware of $0 copayment.   Ruhan Borak (Patty) Chet Burnet, CPhT  [facility], [place], [place], [place] Oral Chemotherapy Patient Advocate Phone: [phone]  Fax: [phone]

## 2023-10-06 NOTE — Telephone Encounter (Signed)
-----   Message from Alyson N Leonard sent at 10/06/2023 12:17 PM EDT ----- Ms. San will receive medication and get started on this Thursday 10/08/23. ----- Message ----- From: Babara Call, MD Sent: 10/05/2023  11:40 AM EDT To: Almarie JINNY Nett, RN; Fonda KANDICE Sax, CMA#  Her tumor burden is high, so I think we can go ahead to have her start and let me see her 1  week after start.    Zhou ----- Message ----- From: Rodgers Renaee SAILOR, RPH-CPP Sent: 10/05/2023   8:54 AM EDT To: Almarie JINNY Nett, RN; Fonda KANDICE Sax, CMA#  Hey Dr. Babara! We have everything ready to go for the Truqap  for this patient, so just let us  know when the NGS results come back and you want to proceed with the medication!  Alyson ----- Message ----- From: Babara Call, MD Sent: 09/29/2023   9:30 PM EDT To: Almarie JINNY Nett, RN; Fonda KANDICE Sax, CMA#  Hi Alyson,  I plan to start patient on capivasertib , based on previous NGS showed PIK 3 CA mutation.  I have sent off NGS on the most recent biopsy and the results are still pending.  Would you please check on approval and once we have got updated NGS confirming the mutation, I will proceed with reduced dose 320 mg twice daily for 4 days, followed by 3 days off.  She has portal hypertension, pending workup with GI.  And also chronic cytopenia.  Thank you  Call

## 2023-10-06 NOTE — Progress Notes (Signed)
 Patient education documented in EPIC note on 10/06/23.

## 2023-10-06 NOTE — Telephone Encounter (Signed)
 Please schedule pt for lab/MD approx 1 week after 7/10. Please notify pt's husband of appt.

## 2023-10-06 NOTE — Telephone Encounter (Signed)
 Patient successfully OnBoarded and drug education provided by pharmacist. Medication scheduled to be shipped on 07/09 for delivery on 07/10 from [facility] to patient's address. Patient also knows to call me at [phone] with any questions or concerns regarding receiving medication or if there is any unexpected change in co-pay.   Dadrian Ballantine (Patty) Chet Burnet, CPhT  [facility], [place], [place], [place] Oral Chemotherapy Patient Advocate Phone: [phone]  Fax: [phone]

## 2023-10-06 NOTE — Telephone Encounter (Signed)
 Clinical Pharmacist Practitioner Encounter   [facility] (Specialty) will deliver medication to patient on 10/08/23.  Patient Education I spoke with Mr. Varnum for overview of new oral chemotherapy medication:Truqap  (capivasertib ) for the treatment of stage IV ER/PR positive, HER2 negative, PIK3CA mutation positive breast cancer in conjunction with fulvestrant , planned duration until disease progression or unacceptable drug toxicity.   Counseled Mr. Vecchiarelli on administration, dosing, side effects, monitoring, drug-food interactions, safe handling, storage, and disposal. Patient will take 2 tablets (320 mg total) by mouth 2 (two) times daily. Take for 4 days, then hold for 3 days. Repeat every 7 days.   Side effects include but not limited to: hyperglycemia, diarrhea, rash, .   Diarrhea: patient knows to use loperamide as needed and call the office if they are having four or more loose stool per day Rash: Patient will start taking loratadine 1 tablet daily at home to help rash prevention. They know to call the office if rash occurs Nausea: they would like to have antiemetic on hand, rx for ondansetron  sent to local pharmacy Mouth sores: they know to request Magic Mouthwash if needed Hyperglycemia: reviewed s/sx of hyperglycemia. Mr. Blackard is also a diabetic and is aware of the signs. Currently Ms. Rundell is checking her her glucose with her home meter 3 times per week. They know to check a home reading if she notices any signs of hyperglycemia  Reviewed with Mr. Callanan importance of keeping a medication schedule and plan for any missed doses.  After discussion with Mr. Winfree no patient barriers to medication adherence identified. He handles the medication for her.   Distress thermometer not completed during telephone call as patient has been on previous lines of therapy.   Mr. Shutt voiced understanding and appreciation. All questions answered. Medication handout  provided.  Provided Mr. Cahoon with Oral Chemotherapy Navigation Clinic phone number. Mr. Ricklefs knows to call the office with questions or concerns. Oral Chemotherapy Navigation Clinic will continue to follow.   Zayley Arras N. Fumiko Cham, PharmD, BCOP, CPP Hematology/Oncology Clinical Pharmacist [facility] Oral Chemotherapy Navigation Clinic [phone]  10/06/2023 12:04 PM

## 2023-10-07 ENCOUNTER — Other Ambulatory Visit: Payer: Self-pay

## 2023-10-07 LAB — MISCELLANEOUS TEST

## 2023-10-07 NOTE — Progress Notes (Signed)
 Tissue bx tempus testing completed. Copy given to MD.   Liquid bx pending.

## 2023-10-08 ENCOUNTER — Encounter: Payer: Self-pay | Admitting: Oncology

## 2023-10-09 NOTE — Progress Notes (Signed)
 Testing completed. Results given to provider.

## 2023-10-12 ENCOUNTER — Encounter: Payer: Self-pay | Admitting: Oncology

## 2023-10-20 ENCOUNTER — Other Ambulatory Visit: Payer: Self-pay

## 2023-10-20 DIAGNOSIS — C50912 Malignant neoplasm of unspecified site of left female breast: Secondary | ICD-10-CM

## 2023-10-21 ENCOUNTER — Inpatient Hospital Stay (HOSPITAL_BASED_OUTPATIENT_CLINIC_OR_DEPARTMENT_OTHER): Payer: MEDICAID | Admitting: Oncology

## 2023-10-21 ENCOUNTER — Inpatient Hospital Stay: Payer: MEDICAID

## 2023-10-21 ENCOUNTER — Encounter: Payer: Self-pay | Admitting: Oncology

## 2023-10-21 VITALS — BP 109/68 | HR 67 | Temp 97.6°F | Resp 18

## 2023-10-21 DIAGNOSIS — K7460 Unspecified cirrhosis of liver: Secondary | ICD-10-CM

## 2023-10-21 DIAGNOSIS — E1169 Type 2 diabetes mellitus with other specified complication: Secondary | ICD-10-CM

## 2023-10-21 DIAGNOSIS — R197 Diarrhea, unspecified: Secondary | ICD-10-CM | POA: Diagnosis not present

## 2023-10-21 DIAGNOSIS — R188 Other ascites: Secondary | ICD-10-CM

## 2023-10-21 DIAGNOSIS — C50912 Malignant neoplasm of unspecified site of left female breast: Secondary | ICD-10-CM

## 2023-10-21 DIAGNOSIS — Z170 Estrogen receptor positive status [ER+]: Secondary | ICD-10-CM

## 2023-10-21 DIAGNOSIS — D72810 Lymphocytopenia: Secondary | ICD-10-CM

## 2023-10-21 DIAGNOSIS — D696 Thrombocytopenia, unspecified: Secondary | ICD-10-CM

## 2023-10-21 DIAGNOSIS — E876 Hypokalemia: Secondary | ICD-10-CM

## 2023-10-21 DIAGNOSIS — G893 Neoplasm related pain (acute) (chronic): Secondary | ICD-10-CM | POA: Insufficient documentation

## 2023-10-21 DIAGNOSIS — Z5111 Encounter for antineoplastic chemotherapy: Secondary | ICD-10-CM | POA: Diagnosis not present

## 2023-10-21 DIAGNOSIS — C50511 Malignant neoplasm of lower-outer quadrant of right female breast: Secondary | ICD-10-CM

## 2023-10-21 LAB — CBC WITH DIFFERENTIAL (CANCER CENTER ONLY)
Abs Immature Granulocytes: 0 K/uL (ref 0.00–0.07)
Basophils Absolute: 0 K/uL (ref 0.0–0.1)
Basophils Relative: 1 %
Eosinophils Absolute: 0.1 K/uL (ref 0.0–0.5)
Eosinophils Relative: 3 %
HCT: 33 % — ABNORMAL LOW (ref 36.0–46.0)
Hemoglobin: 10.8 g/dL — ABNORMAL LOW (ref 12.0–15.0)
Immature Granulocytes: 0 %
Lymphocytes Relative: 15 %
Lymphs Abs: 0.5 K/uL — ABNORMAL LOW (ref 0.7–4.0)
MCH: 28.2 pg (ref 26.0–34.0)
MCHC: 32.7 g/dL (ref 30.0–36.0)
MCV: 86.2 fL (ref 80.0–100.0)
Monocytes Absolute: 0.2 K/uL (ref 0.1–1.0)
Monocytes Relative: 7 %
Neutro Abs: 2.4 K/uL (ref 1.7–7.7)
Neutrophils Relative %: 74 %
Platelet Count: 82 K/uL — ABNORMAL LOW (ref 150–400)
RBC: 3.83 MIL/uL — ABNORMAL LOW (ref 3.87–5.11)
RDW: 15 % (ref 11.5–15.5)
WBC Count: 3.3 K/uL — ABNORMAL LOW (ref 4.0–10.5)
nRBC: 0 % (ref 0.0–0.2)

## 2023-10-21 LAB — CMP (CANCER CENTER ONLY)
ALT: 12 U/L (ref 0–44)
AST: 23 U/L (ref 15–41)
Albumin: 4 g/dL (ref 3.5–5.0)
Alkaline Phosphatase: 75 U/L (ref 38–126)
Anion gap: 8 (ref 5–15)
BUN: 16 mg/dL (ref 8–23)
CO2: 22 mmol/L (ref 22–32)
Calcium: 9.1 mg/dL (ref 8.9–10.3)
Chloride: 108 mmol/L (ref 98–111)
Creatinine: 0.89 mg/dL (ref 0.44–1.00)
GFR, Estimated: >60 mL/min (ref >60)
Glucose, Bld: 167 mg/dL — ABNORMAL HIGH (ref 70–99)
Potassium: 3.2 mmol/L — ABNORMAL LOW (ref 3.5–5.1)
Sodium: 138 mmol/L (ref 135–145)
Total Bilirubin: 0.8 mg/dL (ref 0.0–1.2)
Total Protein: 7.2 g/dL (ref 6.5–8.1)

## 2023-10-21 MED ORDER — POTASSIUM CHLORIDE CRYS ER 20 MEQ PO TBCR: 20.0000 meq | EXTENDED_RELEASE_TABLET | Freq: Every day | ORAL | 0 refills | Status: DC

## 2023-10-21 MED ORDER — IRON-VITAMIN C 65-125 MG PO TABS: 1.0000 | ORAL_TABLET | Freq: Every day | ORAL | 2 refills | Status: DC

## 2023-10-21 MED ORDER — TRAMADOL HCL 50 MG PO TABS
50.0000 mg | ORAL_TABLET | Freq: Four times a day (QID) | ORAL | 0 refills | Status: DC | PRN
Start: 2023-10-21 — End: 2024-01-20

## 2023-10-21 NOTE — Assessment & Plan Note (Signed)
 Follow-up with GI

## 2023-10-21 NOTE — Progress Notes (Signed)
 Hematology/Oncology Progress note [phone] [phone]       CHIEF COMPLAINTS/REASON FOR VISIT:  bilateral breast cancer, thrombocytopenia, leukopenia  ASSESSMENT & PLAN:   Cancer Staging  Malignant neoplasm of left breast, stage 4 (HCC) Staging form: Breast, AJCC 8th Edition - Pathologic stage from 11/25/2021: Stage IB (pT2, pN1, cM0, G2, ER+, PR+, HER2-) - Signed by Babara Call, MD on 11/25/2021 - Pathologic stage from 07/25/2022: Stage IV (rpTX, rpNX, rpM1, ER+, PR: Not Assessed, HER2-) - Signed by Babara Call, MD on 08/14/2022  Malignant neoplasm of lower-outer quadrant of right breast of female, estrogen receptor positive (HCC) Staging form: Breast, AJCC 8th Edition - Pathologic stage from 11/25/2021: Stage IIIA (pT1c, pN3a, cM0, G2, ER+, PR+, HER2-) - Signed by Babara Call, MD on 11/25/2021   Malignant neoplasm of left breast, 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 [name]   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  PET scan showed progressive disease including scattered hypermetabolic bone lesions, small but hypermetabolic lymphadenopathy, concerning for disease progression. CT-guided right cervical lymphadenopathy pathology  showed metastatic poorly differentiated carcinoma, with morphology consistent with breast primary.  NGS PIK3CA, CDH1, TMB 13.2 pMMR, results are pending.  ER/PR/HER2 staining was not done.- Recommend to continue monthly fulvestrant  injection  She tolerates Capivasertib  320mg  BID D1-4 every 7 days.  Monitor glucose level    Malignant neoplasm of lower-outer quadrant of right breast of female, estrogen receptor positive (HCC) See above plan.  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  Drug induced leukopenia vs other etiologies.  Continue observation.   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  17cm Previous bone marrow biopsy showed mildly hypercellular bone marrow with otherwise orderly trilineage hematopoiesis. A definitive morphologic etiology for this pancytopenia is not noted. Negative cytogenetics, negative myeloid FISH testing. Continue B12 supplementation.      Neoplasm related pain Tramadol  50mg  Q6h PRN  Hypokalemia Recommend patient to take potassium chloride  20 mEq daily for 3 days.  Prescription sent to pharmacy.  Diabetes mellitus (HCC) Hyperglycemia can be worsen by capivasertinb.  Monitor closely.    Plan was discussed with patient and her husband  Orders Placed This Encounter  Procedures   Gastrointestinal Panel by PCR , Stool    Standing Status:   Future    Expected Date:   10/21/2023    Expiration Date:   01/19/2024   C Difficile Quick Screen w PCR reflex    Standing Status:   Future    Expected Date:   10/21/2023    Expiration Date:   01/19/2024   CMP (Cancer Center only)    Standing Status:   Future    Expected Date:   01/21/2024    Expiration Date:   04/20/2024  CBC with Differential (Cancer Center Only)    Standing Status:   Future    Expected Date:   01/21/2024     Expiration Date:   04/20/2024   Cancer antigen 27.29    Standing Status:   Future    Expected Date:   11/04/2023    Expiration Date:   02/02/2024   Cancer antigen 15-3    Standing Status:   Future    Expected Date:   11/04/2023    Expiration Date:   02/02/2024   Basic Metabolic Panel - Cancer Center Only    Standing Status:   Future    Expected Date:   11/04/2023    Expiration Date:   02/02/2024   Basic Metabolic Panel - Cancer Center Only    Standing Status:   Future    Expected Date:   10/28/2023    Expiration Date:   01/26/2024   Follow up per LOS [name], questions or concerns.  [name], MD, PhD [facility] Hematology Oncology 10/21/2023     HISTORY OF PRESENTING ILLNESS:   is a  63 y.o.  female presents for follow up of bilateral breast cancer, thrombocytopenia and leukopenia.  Oncology History  Malignant neoplasm of left breast, stage 4 (HCC)  08/06/2021 Mammogram   08/06/2021, digital bilateral mammogram and ultrasound showed left breast 3:00 mass, 1.7 x 1.7 x 1.7 cm, ultrasound of the left axillary is negative. 08/21/2021 right breast 1 x 0.7 x 0.8 cm angulated spiculated mass at the right breast 7:00, 5 cm from nipple.  Ultrasound of the right axillary demonstrates 3 abnormal thickened cortex lymph node. 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 Lael Hines right breast 7:00, invasive mammary carcinoma with lobular features, in situ carcinoma,  ER+, PR+, HER 2-This was found to be concordant by Dr. Lael Hines. right axilla lymph node +, extracapsular extension.  -This was found to be concordant by Dr. Lael Hines   08/28/2021 Initial Diagnosis   Malignant neoplasm of upper-outer quadrant of left breast in female, estrogen receptor  positive (HCC)   08/28/2021 Imaging   CT CHEST, ABDOMEN, AND PELVIS WITH CONTRAST Asymmetric mildly enlarged right axillary nodes. Correlate with biopsy. There are some punctate foci of sclerosis in the included spine that could reflect subtle metastatic disease. Nonobstructing bilateral renal calculi.   08/29/2021 Imaging   BILATERAL BREAST MRI WITH AND WITHOUT CONTRAST 1. 2.5 centimeter enhancing mass in the LOWER OUTER QUADRANT of the RIGHT breast, correlating with known malignancy. 2. At least 4 enlarged RIGHT axillary lymph nodes, correlating well with recently biopsied lymph node showing metastatic disease. 3. 3.4 centimeter enhancing mass in the LEFT breast, with an additional 2.9 centimeters of non mass enhancement posterior to the mass also suspicious for malignancy. This likely correlates with the area calcifications seen mammographically. 4. 5 millimeters satellite nodule along the LATERAL aspect of known malignancy in the LOWER OUTER QUADRANT of the LEFT breast. 5. LEFT axilla is negative for adenopathy.       09/05/2021 Imaging   NUCLEAR MEDICINE WHOLE BODY BONE SCAN Uptake at L2 which is nonspecific but may be related to advanced degenerative disc and facet disease changes at both L1-L2 and L2-L3; no evidence of osseous metastatic disease by CT. No definite osseous  metastatic lesions identified.    Genetic Testing   Negative genetic testing. No pathogenic variants identified on the [facility] CancerNext-Expanded+RNA panel. The report date is 10/03/2021.  The CancerNext-Expanded + RNAinsight gene panel offered by [employer] and includes sequencing and rearrangement analysis for the following 77 genes: IP, ALK, APC*, ATM*, AXIN2, BAP1, BARD1, BLM, BMPR1A, BRCA1*, BRCA2*, BRIP1*, CDC73, CDH1*,CDK4, CDKN1B, CDKN2A, CHEK2*, CTNNA1, DICER1, FANCC, FH, FLCN, GALNT12, KIF1B, LZTR1, MAX, MEN1, MET, MLH1*, MSH2*, MSH3, MSH6*, MUTYH*, NBN, NF1*, NF2, NTHL1, PALB2*, PHOX2B, PMS2*, POT1, PRKAR1A,  PTCH1, PTEN*, RAD51C*, RAD51D*,RB1, RECQL, RET, SDHA, SDHAF2, SDHB, SDHC, SDHD, SMAD4, SMARCA4, SMARCB1, SMARCE1, STK11, SUFU, TMEM127, TP53*,TSC1, TSC2, VHL and XRCC2 (sequencing and deletion/duplication); EGFR, EGLN1, HOXB13, KIT, MITF, PDGFRA, POLD1 and POLE (sequencing only); EPCAM and GREM1 (deletion/duplication only).   11/15/2021 Surgery   She underwent left simple mastectomy and right modified mastectomy.   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  pT2 pN1a, ER +90%, PR +90%, HER2 negative (1+)  # Right Breast invasive lobular carcinoma Grade 2, negative margins, right axillary lymph nodes  30/32 involved with metastatic carcinoma, pT1c pN3a ER +90%, PR +51-90%, HER2 negative (1+)    11/25/2021 Cancer Staging   Staging form: Breast, AJCC 8th Edition - Pathologic stage from 11/25/2021: Stage IB (pT2, pN1, cM0, G2, ER+, PR+, HER2-) - Signed by Babara Call, MD on 11/25/2021 Stage prefix: Initial diagnosis Multigene prognostic tests performed: None Histologic grading system: 3 grade system   01/09/2022 - 03/10/2022 Radiation Therapy   Adjuvant breast Radiation.    07/25/2022 Cancer Staging   Staging form: Breast, AJCC 8th Edition - Pathologic stage from 07/25/2022: Stage IV (rpTX, pNX, pM1, ER+, PR: Not Assessed, HER2-) - Signed by Babara Call, MD on 08/14/2022 Stage prefix: Recurrence Multigene prognostic tests performed: None   07/25/2022 Procedure   Colonoscopy showed Findings - Six 5 to 6 mm polyps in the ascending colon, removed with a cold snare. Resected and retrieved. - One 10 mm polyp in the descending colon, removed with a cold snare. Resected and retrieved. Clip was placed. - One 30 mm polyp in the rectum, removed with a hot snare. Resected and retrieved. Clips were  placed.  Pathology showed A.  COLON POLYP X 6, ASCENDING AND CECUM; COLD SNARE: - COLONIC MUCOSA WITH METASTATIC LOBULAR BREAST CARCINOMA (3) - HYPERPLASTIC POLYP (3)  B.  COLON POLYP, DESCENDING; COLD SNARE: - TUBULAR ADENOMA. - NEGATIVE FOR HIGH-GRADE DYSPLASIA AND MALIGNANCY.  C.  RECTUM POLYP; HOT SNARE: - TUBULOVILLOUS ADENOMA. - NEGATIVE FOR HIGH-GRADE DYSPLASIA AND MALIGNANCY.  Comment: The metastatic lobular breast carcinoma is positive for GATA-3 and ER (strong staining in greater than 90% of the tumor cells). HER2 negative (1+)     08/06/2022 Imaging   PET  Status post bilateral mastectomy with radiation changes in the anterior upper lobes.   No findings suspicious for recurrent or metastatic disease   09/18/2022 Imaging   MRI brain w wo  1. No evidence of intracranial metastatic disease. 2. Mild multifocal T2 FLAIR hyperintense signal abnormality within the cerebral white matter, nonspecific but most often secondary to chronic small vessel ischemia. 3. Paranasal sinus disease as described.   01/27/2023 Imaging   US  left axillary No sonographic evidence of malignancy at the site of palpable concern in the LEFT axilla.    04/22/2023 Imaging   CT chest  abdomen pelvis w contrast showed 1. New right chest wall soft tissue lesion which may represent locally recurrent or metastatic disease. Attention on follow-up. 2. New right seventh rib sclerotic osseous lesion. 3. Splenomegaly.    05/11/2023 Procedure   CT guided biopsy of right axillary nodule   BENIGN FIBROVASCULAR TISSUE.       NO LYMPHOID TISSUE IDENTIFIED.       NEGATIVE FOR MALIGNANCY.    08/19/2023 Imaging   PET scan showed 1. 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 2. Small but hypermetabolic right level IIA, right level IIb, right level V, and bilateral inguinal lymph nodes, suspicious for metastatic disease. 3. Progressive  perihepatic ascites, splenomegaly, and findings of portal venous hypertension including upstream varices adjacent to the distal esophagus and collateral vessels/varices in the gastrohepatic ligament. Progressive edema along the gastrohepatic ligament, peripancreatic region, and retroperitoneum. 4. Nonobstructive bilateral nephrolithiasis. 5.  Aortic Atherosclerosis (ICD10-I70.0).     09/11/2023 Procedure   1. Lymph node, needle/core biopsy, right neck :      -  METASTATIC POORLY DIFFERENTIATED CARCINOMA MORPHOLOGIC CONSISTENT WITH THE      CLINICAL HISTORY OF BREAST PRIMARY (HISTORY OF INVASIVE LOBULAR CARCINOMA OF      BILATERAL BREAST).      NOTE: CLINICAL HISTORY OF STAGE IV BREAST CARCINOMA (INVASIVE LOBULAR OF BOTH      LEFT AND RIGHT) IS NOTED.  THE FINDINGS ARE MORPHOLOGICALLY CONSISTENT WITH      METASTATIC BREAST CARCINOMA.  NOTE CONFIRMATORY IMMUNOHISTOCHEMICAL STAINS ARE PERFORMED GIVEN THE MORPHOLOGIC IMPRESSION AND TO PRESERVE TISSUE FOR CLINICIAN  DIRECTED TESTING.    Malignant neoplasm of lower-outer quadrant of right breast of female, estrogen receptor positive (HCC)  08/06/2021 Mammogram   08/06/2021, digital bilateral mammogram and ultrasound showed left breast 3:00 mass, 1.7 x 1.7 x 1.7 cm, ultrasound of the left axillary is negative. 08/21/2021 right breast 1 x 0.7 x 0.8 cm angulated spiculated mass at the right breast 7:00, 5 cm from nipple.  Ultrasound of the right axillary demonstrates 3 abnormal thickened cortex lymph node. 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 Lael Hines right breast 7:00, invasive mammary carcinoma with lobular features, in situ carcinoma,  ER+, PR+, HER 2-This was found to be concordant by Dr. Lael Hines. right axilla lymph node +, extracapsular extension.  -This was found to  be concordant by Dr. Lael Hines   08/28/2021 Initial Diagnosis   Malignant neoplasm of lower-outer quadrant of right breast of female, estrogen receptor positive (HCC)   08/28/2021 Imaging   CT CHEST, ABDOMEN, AND PELVIS WITH CONTRAST Asymmetric mildly enlarged right axillary nodes. Correlate with biopsy. There are some punctate foci of sclerosis in the included spine that could reflect subtle metastatic disease. Nonobstructing bilateral renal calculi.   08/29/2021 Imaging   BILATERAL BREAST MRI WITH AND WITHOUT CONTRAST 1. 2.5 centimeter enhancing mass in the LOWER OUTER QUADRANT of the RIGHT breast, correlating with known malignancy. 2. At least 4 enlarged RIGHT axillary lymph nodes, correlating well with recently biopsied lymph node showing metastatic disease. 3. 3.4 centimeter enhancing mass in the LEFT breast, with an additional 2.9 centimeters of non mass enhancement posterior to the mass also suspicious for malignancy. This likely correlates with the area calcifications seen mammographically. 4. 5 millimeters satellite nodule along the LATERAL  aspect of known malignancy in the LOWER OUTER QUADRANT of the LEFT breast. 5. LEFT axilla is negative for adenopathy.       09/05/2021 Imaging   NUCLEAR MEDICINE WHOLE BODY BONE SCAN Uptake at L2 which is nonspecific but may be related to advanced degenerative disc and facet disease changes at both L1-L2 and L2-L3; no evidence of osseous metastatic disease by CT. No definite osseous metastatic lesions identified.   09/09/2021 Oncotype testing   [id number] block RIGHT 7:00 5 CM FN; ULTRASOUND-GUIDED BIOPSY Oncotype Dx RS score 22    Genetic Testing   Negative genetic testing. No pathogenic variants identified on the [facility] CancerNext-Expanded+RNA panel. The report date is 10/03/2021.  The CancerNext-Expanded + RNAinsight gene panel offered by [employer] and includes sequencing and rearrangement analysis for the following 77 genes: IP,  ALK, APC*, ATM*, AXIN2, BAP1, BARD1, BLM, BMPR1A, BRCA1*, BRCA2*, BRIP1*, CDC73, CDH1*,CDK4, CDKN1B, CDKN2A, CHEK2*, CTNNA1, DICER1, FANCC, FH, FLCN, GALNT12, KIF1B, LZTR1, MAX, MEN1, MET, MLH1*, MSH2*, MSH3, MSH6*, MUTYH*, NBN, NF1*, NF2, NTHL1, PALB2*, PHOX2B, PMS2*, POT1, PRKAR1A, PTCH1, PTEN*, RAD51C*, RAD51D*,RB1, RECQL, RET, SDHA, SDHAF2, SDHB, SDHC, SDHD, SMAD4, SMARCA4, SMARCB1, SMARCE1, STK11, SUFU, TMEM127, TP53*,TSC1, TSC2, VHL and XRCC2 (sequencing and deletion/duplication); EGFR, EGLN1, HOXB13, KIT, MITF, PDGFRA, POLD1 and POLE (sequencing only); EPCAM and GREM1 (deletion/duplication only).   11/15/2021 Surgery   She underwent left simple mastectomy and right modified mastectomy.   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  pT2 pN1a, ER +90%, PR +90%, HER2 negative (1+)  # Right Breast invasive lobular carcinoma Grade 2, negative margins, right axillary lymph nodes  30/32 involved with metastatic carcinoma, pT1c pN3a ER +90%, PR +51-90%, HER2 negative (1+)    11/25/2021 Cancer Staging   Staging form: Breast, AJCC 8th Edition - Pathologic stage from 11/25/2021: Stage IIIA (pT1c, pN3a, cM0, G2, ER+, PR+, HER2-) - Signed by Babara Call, MD on 11/25/2021 Stage prefix: Initial diagnosis Multigene prognostic tests performed: None Histologic grading system: 3 grade system   12/10/2021 Imaging   PET scan  1. Interval bilateral mastectomy and right axillary node dissection.No evidence of residual disease in the chest wall, nodal metastases or distant metastases. 2. Focal hypermetabolic activity in the proximal rectum corresponding with an intraluminal polypoid lesion, suspicious for a villous adenoma or early colon cancer. Sigmoidoscopy/colonoscopy recommended unless recently  performed   12/10/2021 Imaging   1. Interval bilateral mastectomy and right axillary node dissection.No evidence of residual disease in the chest wall, nodal metastasesor distant metastases. 2. Focal hypermetabolic activity in the proximal rectum corresponding with an intraluminal polypoid lesion, suspicious for a villous adenoma or early colon cancer. Sigmoidoscopy/colonoscopy recommended unless recently performed    She is a poor historian. History of major depression, psychosis, previously on olanzapine  and Remeron  not currently on any and if not currently following up with psychiatrist. Patient's family history is positive for sister with breast cancer   INTERVAL HISTORY Sharon Woods is a 63 y.o. female who has above history reviewed by me today presents for follow up visit for management of bilateral breast cancer Patient reports increasing abdomen distention since May 2025.  Poor historian.  She did report some lower abdominal pain.  She is accompanied by her husband. + diarrhea, started before taking Capivasertib       Review of Systems  Constitutional:  Negative for appetite change, chills,  fatigue and fever.  HENT:   Negative for hearing loss and voice change.   Eyes:  Negative for eye problems.  Respiratory:  Negative for chest tightness and cough.   Cardiovascular:  Negative for chest pain.  Gastrointestinal:  Positive for abdominal distention, abdominal pain and diarrhea. Negative for blood in stool.  Endocrine: Negative for hot flashes.  Genitourinary:  Negative for difficulty urinating and frequency.   Musculoskeletal:  Negative for arthralgias.  Skin:  Negative for itching and rash.  Neurological:  Negative for extremity weakness.  Hematological:  Negative for adenopathy.  Psychiatric/Behavioral:  Negative for confusion.     MEDICAL HISTORY:  Past Medical History:  Diagnosis Date   Anxiety    Cancer (HCC)    Depression    Diabetes mellitus without  complication (HCC)    History of high cholesterol 2000   Hyperlipidemia    Hypertension    Patient denies medical problems    Thrombocytopenia (HCC)     SURGICAL HISTORY: Past Surgical History:  Procedure Laterality Date   ABDOMINAL HYSTERECTOMY     AXILLARY SENTINEL NODE BIOPSY Left 11/15/2021   Procedure: AXILLARY SENTINEL NODE BIOPSY;  Surgeon: Lane Shope, MD;  Location: [facility] ORS;  Service: General;  Laterality: Left;   BREAST BIOPSY Right 08/21/2021   us  bx 7:00 mass coil clip path pending   BREAST BIOPSY Right 08/21/2021   us  bx of LN, hydro marker, path pending   BREAST BIOPSY Left 08/21/2021   us  bx, heart marker, path pending   CESAREAN SECTION     x2   COLONOSCOPY WITH PROPOFOL  N/A 07/25/2022   Procedure: COLONOSCOPY WITH PROPOFOL ;  Surgeon: Therisa Bi, MD;  Location: [facility] ENDOSCOPY;  Service: Gastroenterology;  Laterality: N/A;   MASTECTOMY     MASTECTOMY MODIFIED RADICAL Right 11/15/2021   Procedure: MASTECTOMY MODIFIED RADICAL;  Surgeon: Lane Shope, MD;  Location: [facility] ORS;  Service: General;  Laterality: Right;   NO PAST SURGERIES     TOTAL MASTECTOMY Left 11/15/2021   Procedure: TOTAL MASTECTOMY;  Surgeon: Lane Shope, MD;  Location: [facility] ORS;  Service: General;  Laterality: Left;    SOCIAL HISTORY: Social History   Socioeconomic History   Marital status: Married    Spouse name: Not on file   Number of children: Not on file   Years of education: Not on file   Highest education level: Not on file  Occupational History   Not on file  Tobacco Use   Smoking status: Former    Current packs/day: 0.00    Types: Cigarettes    Quit date: 2013    Years since quitting: 12.5    Passive exposure: Current   Smokeless tobacco: Never  Vaping Use   Vaping status: Never Used  Substance and Sexual Activity   Alcohol use: Not Currently    Comment: occasionally   Drug use: No   Sexual activity: Not Currently    Comment: unable to assess   Other  Topics Concern   Not on file  Social History Narrative   Not on file   Social Drivers of Health   Financial Resource Strain: Medium Risk (10/05/2023)   Received from [facility]   Overall Financial Resource Strain (CARDIA)    Difficulty of Paying Living Expenses: Somewhat hard  Food Insecurity: No Food Insecurity (10/05/2023)   Received from [facility]   Hunger Vital Sign    Within the past 12 months, you worried that your food would run out  before you got the money to buy more.: Never true    Within the past 12 months, the food you bought just didn't last and you didn't have money to get more.: Never true  Transportation Needs: No Transportation Needs (10/05/2023)   Received from [facility] - Transportation    In the past 12 months, has lack of transportation kept you from medical appointments or from getting medications?: No    Lack of Transportation (Non-Medical): No  Physical Activity: Inactive (10/10/2021)   Exercise Vital Sign    Days of Exercise per Week: 0 days    Minutes of Exercise per Session: 0 min  Stress: Stress Concern Present (10/10/2021)   [employer] of Occupational Health - Occupational Stress Questionnaire    Feeling of Stress : To some extent  Social Connections: Socially Isolated (10/10/2021)   Social Connection and Isolation Panel    Frequency of Communication with Friends and Family: Once a week    Frequency of Social Gatherings with Friends and Family: Never    Attends Religious Services: Never    Diplomatic Services operational officer: No    Attends Engineer, structural: Never    Marital Status: Married  Catering manager Violence: Not on file    FAMILY HISTORY: Family History  Problem Relation Age of Onset   Lung cancer Mother    Dementia Mother    Parkinson's disease Father    Cancer Father        unk type   Diabetes Sister    Heart attack Sister    Breast cancer Sister     Cancer Maternal Uncle        unk types   Dementia Maternal Grandmother    Cancer Maternal Grandmother        unk type   Cancer Other    Dementia Other     ALLERGIES:  is allergic to tylenol  [acetaminophen ] and aleve [naproxen].  MEDICATIONS:  Current Outpatient Medications  Medication Sig Dispense Refill   ACCU-CHEK GUIDE test strip USE TO CHECK BLOOD SUGAR UP TO 4 TIMES DAILY AS DIRECTED 100 each 0   Accu-Chek Softclix Lancets lancets USE TO CHECK BLOOD SUAGR UP TO 4 TIMES DAILY AS DIRECTED 100 each 0   anastrozole  (ARIMIDEX ) 1 MG tablet Take 1 tablet by mouth once daily 90 tablet 0   blood glucose meter kit and supplies KIT Dispense based on patient and insurance preference. Use up to four times daily as directed. 1 each 1   calcium  carbonate (OS-CAL) 600 MG TABS tablet Take 2 tablets (1,200 mg total) by mouth daily. 60 tablet 5   capivasertib  (TRUQAP ) 160 MG pack Take 2 tablets (320 mg total) by mouth 2 (two) times daily. Take for 4 days, then hold for 3 days. Repeat every 7 days. 64 tablet 0   cyanocobalamin  (VITAMIN B12) 1000 MCG tablet Take 1 tablet (1,000 mcg total) by mouth daily. 30 tablet 2   escitalopram  (LEXAPRO ) 20 MG tablet Take 1 tablet (20 mg total) by mouth daily. 90 tablet 1   famotidine  (PEPCID ) 40 MG tablet Take 1 tablet (40 mg total) by mouth daily. 90 tablet 1   hydrOXYzine  (ATARAX ) 10 MG tablet TAKE 1 TABLET BY MOUTH TWICE DAILY AS NEEDED FOR ITCHING OR ANXIETY 180 tablet 0   Iron -Vitamin C  65-125 MG TABS Take 1 tablet by mouth daily. 30 tablet 2   metFORMIN  (GLUCOPHAGE ) 500 MG tablet Take 1 tablet (500 mg total)  by mouth 2 (two) times daily with a meal. 180 tablet 1   ondansetron  (ZOFRAN ) 8 MG tablet Take 1 tablet (8 mg total) by mouth every 8 (eight) hours as needed for nausea or vomiting. 20 tablet 2   potassium chloride  SA (KLOR-CON  M) 20 MEQ tablet Take 1 tablet (20 mEq total) by mouth daily. 3 tablet 0   rosuvastatin  (CRESTOR ) 5 MG tablet Take 1 tablet by  mouth once daily 90 tablet 1   traMADol  (ULTRAM ) 50 MG tablet Take 1 tablet (50 mg total) by mouth every 6 (six) hours as needed. 30 tablet 0   triamcinolone  ointment (KENALOG ) 0.5 % Apply 1 Application topically 2 (two) times daily. 30 g 1   Current Facility-Administered Medications  Medication Dose Route Frequency Provider Last Rate Last Admin   cefTRIAXone  (ROCEPHIN ) injection 500 mg  500 mg Intramuscular Once Bernardo Fend, DO         PHYSICAL EXAMINATION: ECOG PERFORMANCE STATUS: 1 - Symptomatic but completely ambulatory Vitals:   10/21/23 1429  BP: 109/68  Pulse: 67  Resp: 18  Temp: 97.6 F (36.4 C)  SpO2: 98%    There were no vitals filed for this visit.    Physical Exam Constitutional:      General: She is not in acute distress. HENT:     Head: Normocephalic and atraumatic.  Eyes:     General: No scleral icterus. Cardiovascular:     Rate and Rhythm: Normal rate.  Pulmonary:     Effort: Pulmonary effort is normal. No respiratory distress.     Breath sounds: No wheezing.  Abdominal:     General: There is distension.  Musculoskeletal:        General: No deformity. Normal range of motion.     Cervical back: Normal range of motion and neck supple.  Skin:    Coloration: Skin is not jaundiced.  Neurological:     Mental Status: She is alert and oriented to person, place, and time. Mental status is at baseline.     LABORATORY DATA:  I have reviewed the data as listed    Latest Ref Rng & Units 10/21/2023    1:59 PM 09/29/2023    3:25 PM 08/26/2023    2:20 PM  CBC  WBC 4.0 - 10.5 K/uL 3.3  2.1  2.4   Hemoglobin 12.0 - 15.0 g/dL 89.1  89.4  89.4   Hematocrit 36.0 - 46.0 % 33.0  32.4  32.6   Platelets 150 - 400 K/uL 82  72  67       Latest Ref Rng & Units 10/21/2023    1:59 PM 09/29/2023    3:25 PM 08/26/2023    2:20 PM  CMP  Glucose 70 - 99 mg/dL 832  827  874   BUN 8 - 23 mg/dL 16  14  16    Creatinine 0.44 - 1.00 mg/dL 9.10  9.21  9.24   Sodium 135 -  145 mmol/L 138  139  138   Potassium 3.5 - 5.1 mmol/L 3.2  3.4  3.7   Chloride 98 - 111 mmol/L 108  108  106   CO2 22 - 32 mmol/L 22  24  24    Calcium  8.9 - 10.3 mg/dL 9.1  9.0  9.1   Total Protein 6.5 - 8.1 g/dL 7.2  7.2  6.9   Total Bilirubin 0.0 - 1.2 mg/dL 0.8  0.9  0.6   Alkaline Phos 38 - 126 U/L 75  69  62   AST 15 - 41 U/L 23  30  22    ALT 0 - 44 U/L 12  14  13      RADIOGRAPHIC STUDIES: I have personally reviewed the radiological images as listed and agreed with the findings in the report. US  LIVER DOPPLER Result Date: 10/05/2023 CLINICAL DATA:  Portal hypertension EXAM: DUPLEX ULTRASOUND OF LIVER TECHNIQUE: Color and duplex Doppler ultrasound was performed to evaluate the hepatic in-flow and out-flow vessels. COMPARISON:  CT abdomen pelvis 05/28/2023 FINDINGS: Liver: Mildly coarsened parenchymal echogenicity. Subtle nodularity of hepatic contours. No focal lesion, mass or intrahepatic biliary ductal dilatation. Main Portal Vein size: 1.2 cm Portal Vein Velocities Main Prox:  37 cm/sec Main Mid: 35 cm/sec Main Dist:  40 cm/sec Right: 29 cm/sec Left: 25 cm/sec Hepatic Vein Velocities Right:  21 cm/sec Middle:  21 cm/sec Left:  20 cm/sec IVC: Present and patent with normal respiratory phasicity. Hepatic Artery Velocity:  31 cm/sec Splenic Vein Velocity:  22 cm/sec Spleen: 19 cm x 20 cm x 8 cm with a total volume of 1487 cm^3 (411 cm^3 is upper limit normal) Portal Vein Occlusion/Thrombus: No Splenic Vein Occlusion/Thrombus: No Ascites: Mild perihepatic ascites. Varices: None IMPRESSION: 1. Patent portal vein with appropriate direction of flow. 2. Splenomegaly. 3. Mild perihepatic ascites. 4.  Cirrhotic liver morphology without focal hepatic lesion. Electronically Signed   By: Aliene Lloyd M.D.   On: 10/05/2023 16:46   US  CORE BIOPSY (LYMPH NODES) Result Date: 09/11/2023 INDICATION: Enlarged right neck lymph node EXAM: ULTRASOUND GUIDED core needle BIOPSY OF cervical lymph node MEDICATIONS:  None. ANESTHESIA/SEDATION: 1% lidocaine  PROCEDURE: The procedure, risks, benefits, and alternatives were explained to the patient. Questions regarding the procedure were encouraged and answered. The patient understands and consents to the procedure. The operative field was prepped with chlorhexidine  in a sterile fashion, and a sterile drape was applied covering the operative field. A sterile gown and sterile gloves were used for the procedure. Local anesthesia was provided with 1% Lidocaine . COMPLICATIONS: None immediate. FINDINGS: With patient in a supine position head of bed elevated proximally 45 degrees, the large neck lymph node was identified. Patient's skin was marked, prepped, and draped in the usual sterile fashion. Anesthesia was achieved by infiltrating 1% lidocaine  into the soft tissue adjacent to the lymph node. Although enlarged, the lymph node was highly mobile and difficult to engage with the biopsy needle. Once his tissue was engaged, a core needle biopsy was obtained. Two total biopsies performed. Biopsies were performed by advancing the biopsy needle through a small incision in the patient's skin until the biopsy needle was visualized within the lymph node. 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 EXAM: ULTRASOUND ABDOMEN LIMITED FINDINGS: Imaging of all 4 quadrants of the abdomen reveals only small volume perisplenic ascites. There is no safe window for paracentesis. IMPRESSION: Only trace perisplenic ascites.  Paracentesis was deferred. Performed By Lavanda Jurist, PA-C Electronically Signed   By: Wilkie Lent M.D.   On: 08/28/2023 10:24   NM PET Image Restage (PS) Skull Base to Thigh (F-18 FDG) Result Date: 08/25/2023 CLINICAL  DATA:  Subsequent treatment strategy for pros cancer. EXAM: NUCLEAR MEDICINE PET SKULL BASE TO THIGH TECHNIQUE: 9.9 mCi F-18 FDG was injected intravenously. Full-ring PET imaging was performed from  the skull base to thigh after the radiotracer. CT data was obtained and used for attenuation correction and anatomic localization. Fasting blood glucose: 111 mg/dl COMPARISON:  94/91/7975 FINDINGS: Mediastinal blood pool activity: SUV max 2.4 Liver activity: SUV max NA NECK: 0.7 cm with right level IIa lymph node with maximum SUV 5.2. Small scattered right level IIb and V lymph nodes including a 0.6 cm right level V lymph node on image 18 series 6 with maximum SUV 3.8. Incidental CT findings: None. CHEST: Soft tissue density favoring scarring along the lateral margin of the right pectoralis muscle and extending along the axilla adjacent to the inferior margin of the neurovascular structures, maximum SUV 2.6, probably therapy related. Incidental CT findings: Bilateral mastectomies. ABDOMEN/PELVIS: Portacaval node 1.1 cm in short axis on image 84 series 6, maximum SUV 5.6. Scattered presumed physiologic activity in bowel. Small but hypermetabolic bilateral inguinal lymph nodes, left inguinal node 0.6 cm in short axis on image 131 series 6 with maximum SUV 5.7. Incidental CT findings: Progressive perihepatic ascites. Findings of portal venous hypertension including uphill varices adjacent to the distal esophagus and collateral vessels/varices in the gastrohepatic ligament. Progressive edema along the gastrohepatic ligament, peripancreatic region, and retroperitoneum. Mild perisplenic ascites along with splenomegaly. Nonobstructive bilateral nephrolithiasis. Atherosclerosis is present, including aortoiliac atherosclerotic disease. Small amount of pelvic ascites above the urinary bladder. SKELETON: Scattered hypermetabolic foci of skeletal metastatic disease noted, including the left anterior ring of C1 (maximum SUV 4.0), left  eighth rib laterally (maximum SUV 8.6) anterior L3 vertebral body (maximum SUV 7.3), and left posterior acetabulum (maximum SUV 9.9), among other sites. These lesions are mostly lytic. Incidental CT findings: None. IMPRESSION: 1. 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 2. Small but hypermetabolic right level IIA, right level IIb, right level V, and bilateral inguinal lymph nodes, suspicious for metastatic disease. 3. Progressive perihepatic ascites, splenomegaly, and findings of portal venous hypertension including upstream varices adjacent to the distal esophagus and collateral vessels/varices in the gastrohepatic ligament. Progressive edema along the gastrohepatic ligament, peripancreatic region, and retroperitoneum. 4. Nonobstructive bilateral nephrolithiasis. 5.  Aortic Atherosclerosis (ICD10-I70.0). Electronically Signed   By: Ryan Salvage M.D.   On: 08/25/2023 10:40

## 2023-10-21 NOTE — Assessment & Plan Note (Signed)
 Due to splenomegaly.  17cm Previous bone marrow biopsy showed mildly hypercellular bone marrow with otherwise orderly trilineage hematopoiesis. A definitive morphologic etiology for this pancytopenia is not noted. Negative cytogenetics, negative myeloid FISH testing. Continue B12 supplementation.

## 2023-10-21 NOTE — Assessment & Plan Note (Signed)
 Hyperglycemia can be worsen by capivasertinb.  Monitor closely.

## 2023-10-21 NOTE — Assessment & Plan Note (Signed)
Check stool studies.  

## 2023-10-21 NOTE — Assessment & Plan Note (Signed)
 Tramadol 50mg  Q6h PRN

## 2023-10-21 NOTE — Patient Instructions (Signed)
 Mrs. Cotugno is to take Potassium 20 mEq daily for 3 days and iron -vitamin C  daily.

## 2023-10-21 NOTE — Assessment & Plan Note (Addendum)
 Bilateral breast invasive lobular carcinoma, s/p bilateral mastectomy. [name]   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  PET scan showed progressive disease including scattered hypermetabolic bone lesions, small but hypermetabolic lymphadenopathy, concerning for disease progression. CT-guided right cervical lymphadenopathy pathology showed metastatic poorly differentiated carcinoma, with morphology consistent with breast primary.  NGS PIK3CA, CDH1, TMB 13.2 pMMR, results are pending.  ER/PR/HER2 staining was not done.- Recommend to continue monthly fulvestrant  injection  She tolerates Capivasertib  320mg  BID D1-4 every 7 days.  Monitor glucose level

## 2023-10-21 NOTE — Assessment & Plan Note (Signed)
 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  Drug induced leukopenia vs other etiologies.  Continue observation.

## 2023-10-21 NOTE — Progress Notes (Signed)
 Patient is still having abdominal pain that radiates through to her back, she was recently on an antibiotic for a bladder infection. She says that she is still having the same symptoms. She is feeling nauseas and has diarrhea.

## 2023-10-21 NOTE — Assessment & Plan Note (Signed)
 See above plan.

## 2023-10-21 NOTE — Assessment & Plan Note (Signed)
Recommend patient to take potassium chloride 20 mEq daily for 3 days.  Prescription sent to pharmacy.

## 2023-10-22 ENCOUNTER — Encounter: Payer: Self-pay | Admitting: Oncology

## 2023-10-22 ENCOUNTER — Telehealth: Payer: Self-pay

## 2023-10-22 NOTE — Telephone Encounter (Signed)
 Spoke with [place] at [facility] who will contact the pathologist to have testing added.

## 2023-10-22 NOTE — Telephone Encounter (Signed)
 Call placed to [facility] for add on ER/PR/HER2 testing to accession [id number].    Request a return call from [facility] for confirmation of add on testing.

## 2023-10-22 NOTE — Telephone Encounter (Signed)
 PA for Tramadol  50mg  has been submitted.   [facility] PA Case ID: [id number] Rx: [id number]

## 2023-10-27 ENCOUNTER — Other Ambulatory Visit: Payer: Self-pay

## 2023-10-27 ENCOUNTER — Other Ambulatory Visit (HOSPITAL_COMMUNITY): Payer: Self-pay

## 2023-10-27 ENCOUNTER — Other Ambulatory Visit: Payer: Self-pay | Admitting: Oncology

## 2023-10-27 DIAGNOSIS — C50912 Malignant neoplasm of unspecified site of left female breast: Secondary | ICD-10-CM

## 2023-10-27 NOTE — Progress Notes (Signed)
 Specialty Pharmacy Ongoing Clinical Assessment Note  Sharon Woods is a 63 y.o. female who is being followed by the specialty pharmacy service for RxSp Oncology   Patient's specialty medication(s) reviewed today: Capivasertib  (TRUQAP )   Missed doses in the last 4 weeks: 0   Patient/Caregiver did not have any additional questions or concerns.   Therapeutic benefit summary: Unable to assess   Adverse events/side effects summary: Experienced adverse events/side effects (per husband, pt has some diarrhea but controlled with imodium. Has zofran  on hand, but no issues with nausea at this time.)   Patient's therapy is appropriate to: Continue    Goals Addressed             This Visit's Progress    Slow Disease Progression       Patient is unable to be assessed as therapy was recently initiated. Patient will maintain adherence         Follow up: 3 months  [facility] Specialty Pharmacist

## 2023-10-27 NOTE — Progress Notes (Signed)
 Specialty Pharmacy Refill Coordination Note  Sharon Woods is a 63 y.o. female contacted today regarding refills of specialty medication(s) Capivasertib  (TRUQAP )  Spoke with patient's husband  Patient requested Delivery   Delivery date: 10/30/23   Verified address: [address] [place] [address]   Medication will be filled on 07.31.25.

## 2023-10-28 ENCOUNTER — Encounter: Payer: Self-pay | Admitting: Oncology

## 2023-10-28 NOTE — Telephone Encounter (Signed)
 PA approved by [employer] 2017 from 10/23/23 to 04/24/24  Key: [facility] PA Case ID # [id number] Rx: [id number]

## 2023-10-29 ENCOUNTER — Encounter: Payer: Self-pay | Admitting: Oncology

## 2023-10-29 ENCOUNTER — Inpatient Hospital Stay: Payer: MEDICAID

## 2023-10-29 ENCOUNTER — Other Ambulatory Visit: Payer: Self-pay

## 2023-10-29 DIAGNOSIS — C50912 Malignant neoplasm of unspecified site of left female breast: Secondary | ICD-10-CM

## 2023-10-29 DIAGNOSIS — Z5111 Encounter for antineoplastic chemotherapy: Secondary | ICD-10-CM | POA: Diagnosis not present

## 2023-10-29 LAB — BASIC METABOLIC PANEL - CANCER CENTER ONLY
Anion gap: 9 (ref 5–15)
BUN: 20 mg/dL (ref 8–23)
CO2: 24 mmol/L (ref 22–32)
Calcium: 9.1 mg/dL (ref 8.9–10.3)
Chloride: 103 mmol/L (ref 98–111)
Creatinine: 0.99 mg/dL (ref 0.44–1.00)
GFR, Estimated: >60 mL/min (ref >60)
Glucose, Bld: 144 mg/dL — ABNORMAL HIGH (ref 70–99)
Potassium: 3.4 mmol/L — ABNORMAL LOW (ref 3.5–5.1)
Sodium: 136 mmol/L (ref 135–145)

## 2023-10-29 MED ORDER — FULVESTRANT 250 MG/5ML IM SOSY
500.0000 mg | PREFILLED_SYRINGE | Freq: Once | INTRAMUSCULAR | Status: AC
  Administered 2023-10-29: 500 mg via INTRAMUSCULAR
  Filled 2023-10-29: qty 10

## 2023-10-29 MED ORDER — CAPIVASERTIB 160 MG PO TBPK
320.0000 mg | ORAL_TABLET | Freq: Two times a day (BID) | ORAL | 0 refills | Status: DC
  Filled 2023-10-29: qty 64, 28d supply, fill #0

## 2023-10-29 NOTE — Telephone Encounter (Signed)
 Spoke to [place] at [facility] to follow put on tesing addon status. She said she will reach out to pathologist for further information and call back. Desk call back number provided.

## 2023-10-29 NOTE — Telephone Encounter (Signed)
 received call from [facility], who states that specimen was sent to [facility] for molecular testing, but she has requested blocks back and once they are received, then they will peform reqeusted testing.

## 2023-11-02 ENCOUNTER — Ambulatory Visit: Payer: MEDICAID | Admitting: Anesthesiology

## 2023-11-02 ENCOUNTER — Ambulatory Visit
Admission: RE | Admit: 2023-11-02 | Discharge: 2023-11-02 | Disposition: A | Discharge status: Home / Self Care | Payer: MEDICAID | Attending: Gastroenterology | Admitting: Gastroenterology

## 2023-11-02 ENCOUNTER — Encounter
Admission: RE | Disposition: A | Discharge status: Home / Self Care | Payer: Self-pay | Source: Home / Self Care | Attending: Gastroenterology

## 2023-11-02 DIAGNOSIS — K766 Portal hypertension: Secondary | ICD-10-CM | POA: Diagnosis not present

## 2023-11-02 DIAGNOSIS — K7460 Unspecified cirrhosis of liver: Secondary | ICD-10-CM | POA: Diagnosis present

## 2023-11-02 DIAGNOSIS — Z5986 Financial insecurity: Secondary | ICD-10-CM | POA: Insufficient documentation

## 2023-11-02 DIAGNOSIS — I10 Essential (primary) hypertension: Secondary | ICD-10-CM | POA: Diagnosis not present

## 2023-11-02 DIAGNOSIS — E119 Type 2 diabetes mellitus without complications: Secondary | ICD-10-CM | POA: Insufficient documentation

## 2023-11-02 DIAGNOSIS — Z7984 Long term (current) use of oral hypoglycemic drugs: Secondary | ICD-10-CM | POA: Insufficient documentation

## 2023-11-02 DIAGNOSIS — Z87891 Personal history of nicotine dependence: Secondary | ICD-10-CM | POA: Diagnosis not present

## 2023-11-02 DIAGNOSIS — K3189 Other diseases of stomach and duodenum: Secondary | ICD-10-CM | POA: Insufficient documentation

## 2023-11-02 DIAGNOSIS — Z79899 Other long term (current) drug therapy: Secondary | ICD-10-CM | POA: Insufficient documentation

## 2023-11-02 HISTORY — PX: ESOPHAGOGASTRODUODENOSCOPY: SHX5428

## 2023-11-02 LAB — GLUCOSE, CAPILLARY: Glucose-Capillary: 174 mg/dL — ABNORMAL HIGH (ref 70–99)

## 2023-11-02 SURGERY — EGD (ESOPHAGOGASTRODUODENOSCOPY)
Anesthesia: General

## 2023-11-02 MED ORDER — LIDOCAINE HCL (CARDIAC) PF 100 MG/5ML IV SOSY
PREFILLED_SYRINGE | INTRAVENOUS | Status: DC | PRN
  Administered 2023-11-02: 100 mg via INTRAVENOUS

## 2023-11-02 MED ORDER — PROPOFOL 10 MG/ML IV BOLUS
INTRAVENOUS | Status: DC | PRN
Start: 2023-11-02 — End: 2023-11-02
  Administered 2023-11-02: 120 mg via INTRAVENOUS
  Administered 2023-11-02: 50 mg via INTRAVENOUS

## 2023-11-02 MED ORDER — SODIUM CHLORIDE 0.9 % IV SOLN
INTRAVENOUS | Status: DC
  Administered 2023-11-02: 20 mL/h via INTRAVENOUS

## 2023-11-02 NOTE — Transfer of Care (Signed)
 Immediate Anesthesia Transfer of Care Note  Patient: Sharon Woods  Procedure(s) Performed: EGD (ESOPHAGOGASTRODUODENOSCOPY)  Patient Location: Endoscopy Unit  Anesthesia Type:General  Level of Consciousness: drowsy  Airway & Oxygen Therapy: Patient Spontanous Breathing  Post-op Assessment: Report given to RN, Post -op Vital signs reviewed and stable, and Patient moving all extremities  Post vital signs: Reviewed and stable  Last Vitals:  Vitals Value Taken Time  BP 119/75 11/02/23 12:06  Temp    Pulse 71 11/02/23 12:07  Resp 14 11/02/23 12:07  SpO2 98 % 11/02/23 12:07  Vitals shown include unfiled device data.  Last Pain:  Vitals:   11/02/23 1110  TempSrc: Temporal  PainSc: 6          Complications: No notable events documented.

## 2023-11-02 NOTE — Anesthesia Postprocedure Evaluation (Signed)
 Anesthesia Post Note  Patient: Sharon Woods  Procedure(s) Performed: EGD (ESOPHAGOGASTRODUODENOSCOPY)  Patient location during evaluation: PACU Anesthesia Type: General Level of consciousness: awake and awake and alert Pain management: satisfactory to patient Vital Signs Assessment: post-procedure vital signs reviewed and stable Respiratory status: spontaneous breathing Cardiovascular status: blood pressure returned to baseline Anesthetic complications: no   No notable events documented.   Last Vitals:  Vitals:   11/02/23 1207 11/02/23 1227  BP:    Pulse:  68  Resp:  16  Temp: 36.8 C   SpO2:  96%    Last Pain:  Vitals:   11/02/23 1227  TempSrc:   PainSc: 0-No pain                 VAN STAVEREN,Fredi Geiler

## 2023-11-02 NOTE — Anesthesia Preprocedure Evaluation (Signed)
 Anesthesia Evaluation  Patient identified by MRN, date of birth, ID band Patient awake    Reviewed: Allergy & Precautions, NPO status , Patient's Chart, lab work & pertinent test results  Airway Mallampati: II  TM Distance: >3 FB Neck ROM: full    Dental  (+) Missing, Poor Dentition, Chipped   Pulmonary neg pulmonary ROS, Patient abstained from smoking., former smoker   Pulmonary exam normal  + decreased breath sounds      Cardiovascular Exercise Tolerance: Good hypertension, Pt. on medications negative cardio ROS Normal cardiovascular exam Rhythm:Regular Rate:Normal     Neuro/Psych   Anxiety Depression    negative neurological ROS  negative psych ROS   GI/Hepatic negative GI ROS, Neg liver ROS,,,(+) Cirrhosis         Endo/Other  negative endocrine ROSdiabetes, Type 2, Oral Hypoglycemic Agents    Renal/GU negative Renal ROS  negative genitourinary   Musculoskeletal   Abdominal  (+) + obese  Peds negative pediatric ROS (+)  Hematology negative hematology ROS (+)   Anesthesia Other Findings Past Medical History: No date: Anxiety No date: Cancer [facility]) No date: Depression No date: Diabetes mellitus without complication (HCC) 2000: History of high cholesterol No date: Hyperlipidemia No date: Hypertension No date: Patient denies medical problems No date: Thrombocytopenia (HCC)  Past Surgical History: No date: ABDOMINAL HYSTERECTOMY 11/15/2021: AXILLARY SENTINEL NODE BIOPSY; Left     Comment:  Procedure: AXILLARY SENTINEL NODE BIOPSY;  Surgeon:               Lane Shope, MD;  Location: [facility] ORS;  Service:               General;  Laterality: Left; 08/21/2021: BREAST BIOPSY; Right     Comment:  us  bx 7:00 mass coil clip path pending 08/21/2021: BREAST BIOPSY; Right     Comment:  us  bx of LN, hydro marker, path pending 08/21/2021: BREAST BIOPSY; Left     Comment:  us  bx, heart marker, path pending No  date: CESAREAN SECTION     Comment:  x2 07/25/2022: COLONOSCOPY WITH PROPOFOL ; N/A     Comment:  Procedure: COLONOSCOPY WITH PROPOFOL ;  Surgeon: Therisa Bi, MD;  Location: [facility] ENDOSCOPY;  Service:               Gastroenterology;  Laterality: N/A; No date: MASTECTOMY 11/15/2021: MASTECTOMY MODIFIED RADICAL; Right     Comment:  Procedure: MASTECTOMY MODIFIED RADICAL;  Surgeon:               Lane Shope, MD;  Location: [facility] ORS;  Service:               General;  Laterality: Right; No date: NO PAST SURGERIES 11/15/2021: TOTAL MASTECTOMY; Left     Comment:  Procedure: TOTAL MASTECTOMY;  Surgeon: Lane Shope,              MD;  Location: [facility] ORS;  Service: General;  Laterality:               Left;  BMI    Body Mass Index: 31.33 kg/m      Reproductive/Obstetrics negative OB ROS                              Anesthesia Physical Anesthesia Plan  ASA: 3  Anesthesia Plan: General   Post-op Pain Management:  Induction: Intravenous  PONV Risk Score and Plan: Propofol  infusion and TIVA  Airway Management Planned: Natural Airway and Nasal Cannula  Additional Equipment:   Intra-op Plan:   Post-operative Plan:   Informed Consent: I have reviewed the patients History and Physical, chart, labs and discussed the procedure including the risks, benefits and alternatives for the proposed anesthesia with the patient or authorized representative who has indicated his/her understanding and acceptance.     Dental Advisory Given  Plan Discussed with: CRNA  Anesthesia Plan Comments:         Anesthesia Quick Evaluation

## 2023-11-02 NOTE — H&P (Signed)
 Ruel Kung , MD [address], [place], [place], [address] Phone: [phone] Fax: [phone]  Primary Care Physician:  Bernardo Fend, DO   Pre-Procedure History & Physical: HPI:  Sharon Woods is a 63 y.o. female is here for an endoscopy    Past Medical History:  Diagnosis Date   Anxiety    Cancer (HCC)    Depression    Diabetes mellitus without complication (HCC)    History of high cholesterol 2000   Hyperlipidemia    Hypertension    Patient denies medical problems    Thrombocytopenia (HCC)     Past Surgical History:  Procedure Laterality Date   ABDOMINAL HYSTERECTOMY     AXILLARY SENTINEL NODE BIOPSY Left 11/15/2021   Procedure: AXILLARY SENTINEL NODE BIOPSY;  Surgeon: Lane Shope, MD;  Location: [facility] ORS;  Service: General;  Laterality: Left;   BREAST BIOPSY Right 08/21/2021   us  bx 7:00 mass coil clip path pending   BREAST BIOPSY Right 08/21/2021   us  bx of LN, hydro marker, path pending   BREAST BIOPSY Left 08/21/2021   us  bx, heart marker, path pending   CESAREAN SECTION     x2   COLONOSCOPY WITH PROPOFOL  N/A 07/25/2022   Procedure: COLONOSCOPY WITH PROPOFOL ;  Surgeon: Kung Ruel, MD;  Location: [facility] ENDOSCOPY;  Service: Gastroenterology;  Laterality: N/A;   MASTECTOMY     MASTECTOMY MODIFIED RADICAL Right 11/15/2021   Procedure: MASTECTOMY MODIFIED RADICAL;  Surgeon: Lane Shope, MD;  Location: [facility] ORS;  Service: General;  Laterality: Right;   NO PAST SURGERIES     TOTAL MASTECTOMY Left 11/15/2021   Procedure: TOTAL MASTECTOMY;  Surgeon: Lane Shope, MD;  Location: [facility] ORS;  Service: General;  Laterality: Left;    Prior to Admission medications   Medication Sig Start Date End Date Taking? Authorizing Provider  ACCU-CHEK GUIDE test strip USE TO CHECK BLOOD SUGAR UP TO 4 TIMES DAILY AS DIRECTED 11/13/21   Babara Call, MD  Accu-Chek Softclix Lancets lancets USE TO CHECK BLOOD SUAGR UP TO 4 TIMES DAILY AS DIRECTED 12/03/21    Babara Call, MD  anastrozole  (ARIMIDEX ) 1 MG tablet Take 1 tablet by mouth once daily 08/21/23   Babara Call, MD  blood glucose meter kit and supplies KIT Dispense based on patient and insurance preference. Use up to four times daily as directed. 09/27/21   Babara Call, MD  calcium  carbonate (OS-CAL) 600 MG TABS tablet Take 2 tablets (1,200 mg total) by mouth daily. 04/29/22   Babara Call, MD  capivasertib  (TRUQAP ) 160 MG pack Take 2 tablets (320 mg total) by mouth 2 (two) times daily. Take for 4 days, then hold for 3 days. Repeat every 7 days. 10/29/23   Babara Call, MD  cyanocobalamin  (VITAMIN B12) 1000 MCG tablet Take 1 tablet (1,000 mcg total) by mouth daily. 10/14/22   Babara Call, MD  escitalopram  (LEXAPRO ) 20 MG tablet Take 1 tablet (20 mg total) by mouth daily. 04/23/23   Bernardo Fend, DO  famotidine  (PEPCID ) 40 MG tablet Take 1 tablet (40 mg total) by mouth daily. 07/23/23   Bernardo Fend, DO  hydrOXYzine  (ATARAX ) 10 MG tablet TAKE 1 TABLET BY MOUTH TWICE DAILY AS NEEDED FOR ITCHING OR ANXIETY 08/21/23   Bernardo Fend, DO  Iron -Vitamin C  65-125 MG TABS Take 1 tablet by mouth daily. 10/21/23   Babara Call, MD  metFORMIN  (GLUCOPHAGE ) 500 MG tablet Take 1 tablet (500 mg total) by mouth 2 (two) times daily with a  meal. 04/27/23   Bernardo Fend, DO  ondansetron  (ZOFRAN ) 8 MG tablet Take 1 tablet (8 mg total) by mouth every 8 (eight) hours as needed for nausea or vomiting. 10/06/23   Leonard, Alyson N, RPH-CPP  potassium chloride  SA (KLOR-CON  M) 20 MEQ tablet Take 1 tablet (20 mEq total) by mouth daily. 10/21/23   Babara Call, MD  rosuvastatin  (CRESTOR ) 5 MG tablet Take 1 tablet by mouth once daily 07/06/23   Bernardo Fend, DO  traMADol  (ULTRAM ) 50 MG tablet Take 1 tablet (50 mg total) by mouth every 6 (six) hours as needed. 10/21/23   Babara Call, MD  triamcinolone  ointment (KENALOG ) 0.5 % Apply 1 Application topically 2 (two) times daily. 09/02/22   Babara Call, MD    Allergies as of 10/07/2023 - Review Complete  09/29/2023  Allergen Reaction Noted   Tylenol  [acetaminophen ]  10/21/2021   Aleve [naproxen] Rash 10/21/2021    Family History  Problem Relation Age of Onset   Lung cancer Mother    Dementia Mother    Parkinson's disease Father    Cancer Father        unk type   Diabetes Sister    Heart attack Sister    Breast cancer Sister    Cancer Maternal Uncle        unk types   Dementia Maternal Grandmother    Cancer Maternal Grandmother        unk type   Cancer Other    Dementia Other     Social History   Socioeconomic History   Marital status: Married    Spouse name: Not on file   Number of children: Not on file   Years of education: Not on file   Highest education level: Not on file  Occupational History   Not on file  Tobacco Use   Smoking status: Former    Current packs/day: 0.00    Types: Cigarettes    Quit date: 2013    Years since quitting: 12.5    Passive exposure: Current   Smokeless tobacco: Never  Vaping Use   Vaping status: Never Used  Substance and Sexual Activity   Alcohol use: Not Currently    Comment: occasionally   Drug use: No   Sexual activity: Not Currently    Comment: unable to assess   Other Topics Concern   Not on file  Social History Narrative   Not on file   Social Drivers of Health   Financial Resource Strain: Medium Risk (10/05/2023)   Received from [facility]   Overall Financial Resource Strain (CARDIA)    Difficulty of Paying Living Expenses: Somewhat hard  Food Insecurity: No Food Insecurity (10/05/2023)   Received from [facility]   Hunger Vital Sign    Within the past 12 months, you worried that your food would run out before you got the money to buy more.: Never true    Within the past 12 months, the food you bought just didn't last and you didn't have money to get more.: Never true  Transportation Needs: No Transportation Needs (10/05/2023)   Received from [facility] - Transportation    In the past 12 months, has lack of transportation kept you from medical appointments or from getting medications?: No    Lack of Transportation (Non-Medical): No  Physical Activity: Inactive (10/10/2021)   Exercise Vital Sign    Days of Exercise per Week: 0 days  Minutes of Exercise per Session: 0 min  Stress: Stress Concern Present (10/10/2021)   [employer] - Occupational Stress Questionnaire    Feeling of Stress : To some extent  Social Connections: Socially Isolated (10/10/2021)   Social Connection and Isolation Panel    Frequency of Communication with Friends and Family: Once a week    Frequency of Social Gatherings with Friends and Family: Never    Attends Religious Services: Never    Diplomatic Services operational officer: No    Attends Engineer, structural: Never    Marital Status: Married  Catering manager Violence: Not on file    Review of Systems: See HPI, otherwise negative ROS  Physical Exam: LMP  (LMP Unknown) Comment: Patient is not a good historian General:   Alert,  pleasant and cooperative in NAD Head:  Normocephalic and atraumatic. Neck:  Supple; no masses or thyromegaly. Lungs:  Clear throughout to auscultation, normal respiratory effort.    Heart:  +S1, +S2, Regular rate and rhythm, No edema. Abdomen:  Soft, nontender and nondistended. Normal bowel sounds, without guarding, and without rebound.   Neurologic:  Alert and  oriented x4;  grossly normal neurologically.  Impression/Plan: Sharon Woods is here for an endoscopy  to be performed for  evaluation of esophageal varices in the setting of liver cirrhosis    Risks, benefits, limitations, and alternatives regarding endoscopy have been reviewed with the patient.  Questions have been answered.  All parties agreeable.   Ruel Kung, MD  11/02/2023, 11:00 AM

## 2023-11-02 NOTE — Op Note (Signed)
 [facility] Patient Name: Sharon Woods Procedure Date: 11/02/2023 11:42 AM MRN: [id number] Account #: [id number] Date of Birth: 1960/12/09 Admit Type: Outpatient Age: 64 Room: [facility] ENDO ROOM 1 Gender: Female Note Status: Finalized Instrument Name: Barnie Endoscope [id number] Procedure:             Upper GI endoscopy Indications:           Cirrhosis rule out esophageal varices Providers:             Ruel Kung MD, MD Referring MD:          Sharyle Fischer (Referring MD) Medicines:             Monitored Anesthesia Care Complications:         No immediate complications. Procedure:             Pre-Anesthesia Assessment:                        - Prior to the procedure, a History and Physical was                         performed, and patient medications, allergies and                         sensitivities were reviewed. The patient's tolerance                         of previous anesthesia was reviewed.                        - The risks and benefits of the procedure and the                         sedation options and risks were discussed with the                         patient. All questions were answered and informed                         consent was obtained.                        - ASA Grade Assessment: II - A patient with mild                         systemic disease.                        After obtaining informed consent, the endoscope was                         passed under direct vision. Throughout the procedure,                         the patient's blood pressure, pulse, and oxygen                         saturations were monitored continuously. The Endoscope  was introduced through the mouth, and advanced to the                         third part of duodenum. The upper GI endoscopy was                         accomplished with ease. The patient tolerated the                         procedure well. Findings:       The examined duodenum was normal.      The esophagus was normal.      Portal hypertensive gastropathy was found in the entire examined stomach.      The cardia and gastric fundus were normal on retroflexion. Impression:            - Normal examined duodenum.                        - Normal esophagus.                        - Portal hypertensive gastropathy.                        - No specimens collected. Recommendation:        - Discharge patient to home (with escort).                        - Resume previous diet.                        - Continue present medications.                        - Return to my office as previously scheduled. Procedure Code(s):     --- Professional ---                        [phone], Esophagogastroduodenoscopy, flexible,                         transoral; diagnostic, including collection of                         specimen(s) by brushing or washing, when performed                         (separate procedure) Diagnosis Code(s):     --- Professional ---                        K76.6, Portal hypertension                        K31.89, Other diseases of stomach and duodenum                        K74.60, Unspecified cirrhosis of liver CPT copyright 2022 American Medical Association. All rights reserved. The codes documented in this report are preliminary and upon coder review may  be revised to meet current compliance requirements. Ruel Kung, MD Ruel Kung MD, MD 11/02/2023 12:01:17 PM This  report has been signed electronically. Number of Addenda: 0 Note Initiated On: 11/02/2023 11:42 AM Estimated Blood Loss:  Estimated blood loss: none.      [facility]

## 2023-11-03 ENCOUNTER — Encounter: Payer: Self-pay | Admitting: Gastroenterology

## 2023-11-03 NOTE — Telephone Encounter (Signed)
 Testing result has been completed and report has been addended.

## 2023-11-05 ENCOUNTER — Inpatient Hospital Stay: Payer: MEDICAID | Attending: Radiation Oncology

## 2023-11-05 DIAGNOSIS — Z79899 Other long term (current) drug therapy: Secondary | ICD-10-CM | POA: Diagnosis not present

## 2023-11-05 DIAGNOSIS — Z809 Family history of malignant neoplasm, unspecified: Secondary | ICD-10-CM | POA: Insufficient documentation

## 2023-11-05 DIAGNOSIS — D72819 Decreased white blood cell count, unspecified: Secondary | ICD-10-CM | POA: Diagnosis not present

## 2023-11-05 DIAGNOSIS — C785 Secondary malignant neoplasm of large intestine and rectum: Secondary | ICD-10-CM | POA: Insufficient documentation

## 2023-11-05 DIAGNOSIS — Z5111 Encounter for antineoplastic chemotherapy: Secondary | ICD-10-CM | POA: Diagnosis present

## 2023-11-05 DIAGNOSIS — Z170 Estrogen receptor positive status [ER+]: Secondary | ICD-10-CM | POA: Diagnosis not present

## 2023-11-05 DIAGNOSIS — R197 Diarrhea, unspecified: Secondary | ICD-10-CM | POA: Diagnosis not present

## 2023-11-05 DIAGNOSIS — Z801 Family history of malignant neoplasm of trachea, bronchus and lung: Secondary | ICD-10-CM | POA: Insufficient documentation

## 2023-11-05 DIAGNOSIS — G893 Neoplasm related pain (acute) (chronic): Secondary | ICD-10-CM | POA: Diagnosis not present

## 2023-11-05 DIAGNOSIS — D696 Thrombocytopenia, unspecified: Secondary | ICD-10-CM | POA: Insufficient documentation

## 2023-11-05 DIAGNOSIS — E1165 Type 2 diabetes mellitus with hyperglycemia: Secondary | ICD-10-CM | POA: Diagnosis not present

## 2023-11-05 DIAGNOSIS — Z803 Family history of malignant neoplasm of breast: Secondary | ICD-10-CM | POA: Insufficient documentation

## 2023-11-05 DIAGNOSIS — C50511 Malignant neoplasm of lower-outer quadrant of right female breast: Secondary | ICD-10-CM | POA: Diagnosis present

## 2023-11-05 DIAGNOSIS — Z87891 Personal history of nicotine dependence: Secondary | ICD-10-CM | POA: Insufficient documentation

## 2023-11-05 DIAGNOSIS — C50912 Malignant neoplasm of unspecified site of left female breast: Secondary | ICD-10-CM

## 2023-11-05 DIAGNOSIS — K7460 Unspecified cirrhosis of liver: Secondary | ICD-10-CM | POA: Diagnosis not present

## 2023-11-05 DIAGNOSIS — C50812 Malignant neoplasm of overlapping sites of left female breast: Secondary | ICD-10-CM | POA: Diagnosis present

## 2023-11-05 LAB — BASIC METABOLIC PANEL - CANCER CENTER ONLY
Anion gap: 8 (ref 5–15)
BUN: 19 mg/dL (ref 8–23)
CO2: 25 mmol/L (ref 22–32)
Calcium: 9 mg/dL (ref 8.9–10.3)
Chloride: 107 mmol/L (ref 98–111)
Creatinine: 0.93 mg/dL (ref 0.44–1.00)
GFR, Estimated: >60 mL/min (ref >60)
Glucose, Bld: 195 mg/dL — ABNORMAL HIGH (ref 70–99)
Potassium: 3.2 mmol/L — ABNORMAL LOW (ref 3.5–5.1)
Sodium: 140 mmol/L (ref 135–145)

## 2023-11-06 LAB — CANCER ANTIGEN 27.29: CA 27.29: 329.2 U/mL — ABNORMAL HIGH (ref 0.0–38.6)

## 2023-11-06 LAB — CANCER ANTIGEN 15-3: CA 15-3: 271 U/mL — ABNORMAL HIGH (ref 0.0–25.0)

## 2023-11-10 ENCOUNTER — Other Ambulatory Visit: Payer: Self-pay

## 2023-11-10 DIAGNOSIS — C50912 Malignant neoplasm of unspecified site of left female breast: Secondary | ICD-10-CM

## 2023-11-11 ENCOUNTER — Other Ambulatory Visit: Payer: Self-pay

## 2023-11-11 ENCOUNTER — Encounter: Payer: Self-pay | Admitting: Oncology

## 2023-11-11 ENCOUNTER — Inpatient Hospital Stay: Payer: MEDICAID | Admitting: Oncology

## 2023-11-11 ENCOUNTER — Inpatient Hospital Stay: Payer: MEDICAID

## 2023-11-11 VITALS — BP 134/82 | HR 76 | Temp 97.6°F | Resp 18

## 2023-11-11 DIAGNOSIS — Z5111 Encounter for antineoplastic chemotherapy: Secondary | ICD-10-CM | POA: Diagnosis not present

## 2023-11-11 DIAGNOSIS — K7460 Unspecified cirrhosis of liver: Secondary | ICD-10-CM

## 2023-11-11 DIAGNOSIS — C50912 Malignant neoplasm of unspecified site of left female breast: Secondary | ICD-10-CM

## 2023-11-11 DIAGNOSIS — R197 Diarrhea, unspecified: Secondary | ICD-10-CM

## 2023-11-11 DIAGNOSIS — D696 Thrombocytopenia, unspecified: Secondary | ICD-10-CM

## 2023-11-11 DIAGNOSIS — R188 Other ascites: Secondary | ICD-10-CM

## 2023-11-11 DIAGNOSIS — G893 Neoplasm related pain (acute) (chronic): Secondary | ICD-10-CM | POA: Diagnosis not present

## 2023-11-11 DIAGNOSIS — C50511 Malignant neoplasm of lower-outer quadrant of right female breast: Secondary | ICD-10-CM

## 2023-11-11 DIAGNOSIS — Z170 Estrogen receptor positive status [ER+]: Secondary | ICD-10-CM

## 2023-11-11 DIAGNOSIS — E1169 Type 2 diabetes mellitus with other specified complication: Secondary | ICD-10-CM

## 2023-11-11 LAB — CMP (CANCER CENTER ONLY)
ALT: 12 U/L (ref 0–44)
AST: 21 U/L (ref 15–41)
Albumin: 3.6 g/dL (ref 3.5–5.0)
Alkaline Phosphatase: 89 U/L (ref 38–126)
Anion gap: 9 (ref 5–15)
BUN: 23 mg/dL (ref 8–23)
CO2: 23 mmol/L (ref 22–32)
Calcium: 8.7 mg/dL — ABNORMAL LOW (ref 8.9–10.3)
Chloride: 103 mmol/L (ref 98–111)
Creatinine: 0.97 mg/dL (ref 0.44–1.00)
GFR, Estimated: >60 mL/min (ref >60)
Glucose, Bld: 294 mg/dL — ABNORMAL HIGH (ref 70–99)
Potassium: 3.7 mmol/L (ref 3.5–5.1)
Sodium: 135 mmol/L (ref 135–145)
Total Bilirubin: 0.6 mg/dL (ref 0.0–1.2)
Total Protein: 6.8 g/dL (ref 6.5–8.1)

## 2023-11-11 LAB — CBC WITH DIFFERENTIAL (CANCER CENTER ONLY)
Abs Immature Granulocytes: 0.01 K/uL (ref 0.00–0.07)
Basophils Absolute: 0 K/uL (ref 0.0–0.1)
Basophils Relative: 1 %
Eosinophils Absolute: 0.1 K/uL (ref 0.0–0.5)
Eosinophils Relative: 2 %
HCT: 32.4 % — ABNORMAL LOW (ref 36.0–46.0)
Hemoglobin: 10.6 g/dL — ABNORMAL LOW (ref 12.0–15.0)
Immature Granulocytes: 0 %
Lymphocytes Relative: 14 %
Lymphs Abs: 0.5 K/uL — ABNORMAL LOW (ref 0.7–4.0)
MCH: 28 pg (ref 26.0–34.0)
MCHC: 32.7 g/dL (ref 30.0–36.0)
MCV: 85.7 fL (ref 80.0–100.0)
Monocytes Absolute: 0.2 K/uL (ref 0.1–1.0)
Monocytes Relative: 6 %
Neutro Abs: 2.6 K/uL (ref 1.7–7.7)
Neutrophils Relative %: 77 %
Platelet Count: 80 K/uL — ABNORMAL LOW (ref 150–400)
RBC: 3.78 MIL/uL — ABNORMAL LOW (ref 3.87–5.11)
RDW: 15.4 % (ref 11.5–15.5)
WBC Count: 3.4 K/uL — ABNORMAL LOW (ref 4.0–10.5)
nRBC: 0 % (ref 0.0–0.2)

## 2023-11-11 NOTE — Progress Notes (Signed)
 Hematology/Oncology Progress note [phone] Fax:([phone]       CHIEF COMPLAINTS/REASON FOR VISIT:  bilateral breast cancer, thrombocytopenia, leukopenia  ASSESSMENT & PLAN:   Cancer Staging  Malignant neoplasm of left breast, stage 4 (HCC) Staging form: Breast, AJCC 8th Edition - Pathologic stage from 11/25/2021: Stage IB (pT2, pN1, cM0, G2, ER+, PR+, HER2-) - Signed by Babara Call, MD on 11/25/2021 - Pathologic stage from 07/25/2022: Stage IV (rpTX, rpNX, rpM1, ER+, PR: Not Assessed, HER2-) - Signed by Babara Call, MD on 08/14/2022  Malignant neoplasm of lower-outer quadrant of right breast of female, estrogen receptor positive (HCC) Staging form: Breast, AJCC 8th Edition - Pathologic stage from 11/25/2021: Stage IIIA (pT1c, pN3a, cM0, G2, ER+, PR+, HER2-) - Signed by Babara Call, MD on 11/25/2021   Malignant neoplasm of left breast, 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 [name]   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  PET scan showed progressive disease including scattered hypermetabolic bone lesions, small but hypermetabolic lymphadenopathy, concerning for disease progression. CT-guided right cervical lymphadenopathy pathology  showed metastatic poorly differentiated carcinoma, with morphology consistent with breast primary.  NGS PIK3CA, CDH1, TMB 13.2 pMMR, results are pending.  ER/PR/HER2 staining was not done.- Recommend to continue monthly fulvestrant  injection  She tolerates Capivasertib  320mg  BID D1-4 every 7 days.  Monitor glucose level    Malignant neoplasm of lower-outer quadrant of right breast of female, estrogen receptor positive (HCC) See above plan.  Liver cirrhosis (HCC) Follow up with GI Uptodate for EGD varices screening.   Neoplasm related pain Tramadol  50mg  Q6h PRN  Thrombocytopenia (HCC) Due to splenomegaly.  17cm Previous bone marrow biopsy showed mildly hypercellular bone marrow with otherwise orderly trilineage hematopoiesis. A definitive morphologic etiology for this pancytopenia is not noted. Negative cytogenetics, negative myeloid FISH testing. Continue B12 supplementation.      Diabetes mellitus (HCC) Hyperglycemia is worsen due to capivasertinb.  Results will be sent to pcp, appreciate co-management of glycemic control.      Diarrhea She developed diarrhea before state of Capivasertib  Discussed about Imodium PRN instructions.   Plan was discussed with patient and her husband  Orders Placed This Encounter  Procedures   Cancer antigen 19-9    Standing Status:   Future    Expected Date:   12/09/2023    Expiration Date:   03/08/2024   Cancer antigen 27.29    Standing Status:   Future    Expected Date:   12/09/2023    Expiration Date:   03/08/2024   CMP (Cancer Center only)    Standing Status:   Future    Expected Date:   12/09/2023    Expiration Date:   03/08/2024   CBC with Differential (Cancer Center Only)    Standing Status:   Future    Expected Date:   12/09/2023    Expiration Date:  03/08/2024   Basic Metabolic Panel - Cancer Center Only    Standing Status:   Future    Expected Date:   11/26/2023    Expiration Date:   02/24/2024   Follow up per LOS All  questions were answered. The patient knows to call the clinic with any problems, questions or concerns.  [name], MD, PhD [facility] Hematology Oncology 11/11/2023     HISTORY OF PRESENTING ILLNESS:   is a  63 y.o.  female presents for follow up of bilateral breast cancer, thrombocytopenia and leukopenia.  Oncology History  Malignant neoplasm of left breast, stage 4 (HCC)  08/06/2021 Mammogram   08/06/2021, digital bilateral mammogram and ultrasound showed left breast 3:00 mass, 1.7 x 1.7 x 1.7 cm, ultrasound of the left axillary is negative. 08/21/2021 right breast 1 x 0.7 x 0.8 cm angulated spiculated mass at the right breast 7:00, 5 cm from nipple.  Ultrasound of the right axillary demonstrates 3 abnormal thickened cortex lymph node. 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 Lael Hines right breast 7:00, invasive mammary carcinoma with lobular features, in situ carcinoma,  ER+, PR+, HER 2-This was found to be concordant by Dr. Lael Hines. right axilla lymph node +, extracapsular extension.  -This was found to be concordant by Dr. Lael Hines   08/28/2021 Initial Diagnosis   Malignant neoplasm of upper-outer quadrant of left breast in female, estrogen receptor positive (HCC)   08/28/2021 Imaging   CT CHEST, ABDOMEN, AND PELVIS WITH CONTRAST Asymmetric mildly enlarged right axillary nodes. Correlate with biopsy. There are some punctate foci of sclerosis in the included spine that could reflect subtle metastatic disease. Nonobstructing bilateral renal calculi.   08/29/2021 Imaging   BILATERAL BREAST MRI WITH AND WITHOUT CONTRAST 1. 2.5 centimeter enhancing mass in the LOWER OUTER QUADRANT of the RIGHT breast, correlating with known malignancy. 2. At least 4 enlarged RIGHT axillary lymph nodes, correlating well with  recently biopsied lymph node showing metastatic disease. 3. 3.4 centimeter enhancing mass in the LEFT breast, with an additional 2.9 centimeters of non mass enhancement posterior to the mass also suspicious for malignancy. This likely correlates with the area calcifications seen mammographically. 4. 5 millimeters satellite nodule along the LATERAL aspect of known malignancy in the LOWER OUTER QUADRANT of the LEFT breast. 5. LEFT axilla is negative for adenopathy.       09/05/2021 Imaging   NUCLEAR MEDICINE WHOLE BODY BONE SCAN Uptake at L2 which is nonspecific but may be related to advanced degenerative disc and facet disease changes at both L1-L2 and L2-L3; no evidence of osseous metastatic disease by CT. No definite osseous metastatic lesions identified.    Genetic Testing   Negative genetic testing. No pathogenic variants identified on the [facility] CancerNext-Expanded+RNA panel. The report date is 10/03/2021.  The CancerNext-Expanded + RNAinsight gene panel offered by [employer] and includes sequencing and rearrangement analysis for the following 77 genes: IP, ALK, APC*, ATM*, AXIN2, BAP1, BARD1, BLM, BMPR1A, BRCA1*, BRCA2*, BRIP1*, CDC73, CDH1*,CDK4, CDKN1B, CDKN2A, CHEK2*, CTNNA1, DICER1, FANCC, FH, FLCN, GALNT12, KIF1B, LZTR1, MAX, MEN1, MET, MLH1*, MSH2*, MSH3, MSH6*, MUTYH*, NBN, NF1*, NF2, NTHL1, PALB2*, PHOX2B, PMS2*, POT1, PRKAR1A, PTCH1, PTEN*, RAD51C*, RAD51D*,RB1, RECQL, RET, SDHA, SDHAF2, SDHB, SDHC, SDHD, SMAD4, SMARCA4, SMARCB1, SMARCE1, STK11, SUFU, TMEM127, TP53*,TSC1, TSC2, VHL and XRCC2 (sequencing and deletion/duplication); EGFR, EGLN1, HOXB13,  KIT, MITF, PDGFRA, POLD1 and POLE (sequencing only); EPCAM and GREM1 (deletion/duplication only).   11/15/2021 Surgery   She underwent left simple mastectomy and right modified mastectomy.   Pathology # Left breast, invasive lobular carcinoma Grade 2, with back ground neoplasia [LCIS and atypical lobular hyperplasia], benign  intraductal papilloma, negative margins, Left axillary SLNB 2/2 involved with metastatic carcinoma, extranodal extension.  pT2 pN1a, ER +90%, PR +90%, HER2 negative (1+)  # Right Breast invasive lobular carcinoma Grade 2, negative margins, right axillary lymph nodes  30/32 involved with metastatic carcinoma, pT1c pN3a ER +90%, PR +51-90%, HER2 negative (1+)    11/25/2021 Cancer Staging   Staging form: Breast, AJCC 8th Edition - Pathologic stage from 11/25/2021: Stage IB (pT2, pN1, cM0, G2, ER+, PR+, HER2-) - Signed by Babara Call, MD on 11/25/2021 Stage prefix: Initial diagnosis Multigene prognostic tests performed: None Histologic grading system: 3 grade system   01/09/2022 - 03/10/2022 Radiation Therapy   Adjuvant breast Radiation.    07/25/2022 Cancer Staging   Staging form: Breast, AJCC 8th Edition - Pathologic stage from 07/25/2022: Stage IV (rpTX, pNX, pM1, ER+, PR: Not Assessed, HER2-) - Signed by Babara Call, MD on 08/14/2022 Stage prefix: Recurrence Multigene prognostic tests performed: None   07/25/2022 Procedure   Colonoscopy showed Findings - Six 5 to 6 mm polyps in the ascending colon, removed with a cold snare. Resected and retrieved. - One 10 mm polyp in the descending colon, removed with a cold snare. Resected and retrieved. Clip was placed. - One 30 mm polyp in the rectum, removed with a hot snare. Resected and retrieved. Clips were placed.  Pathology showed A.  COLON POLYP X 6, ASCENDING AND CECUM; COLD SNARE: - COLONIC MUCOSA WITH METASTATIC LOBULAR BREAST CARCINOMA (3) - HYPERPLASTIC POLYP (3)  B.  COLON POLYP, DESCENDING; COLD SNARE: - TUBULAR ADENOMA. - NEGATIVE FOR HIGH-GRADE DYSPLASIA AND MALIGNANCY.  C.  RECTUM POLYP; HOT SNARE: - TUBULOVILLOUS ADENOMA. - NEGATIVE FOR HIGH-GRADE DYSPLASIA AND MALIGNANCY.  Comment: The metastatic lobular breast carcinoma is positive for GATA-3 and ER (strong staining in greater than 90% of the tumor cells). HER2 negative (1+)      08/06/2022 Imaging   PET  Status post bilateral mastectomy with radiation changes in the anterior upper lobes.   No findings suspicious for recurrent or metastatic disease   09/18/2022 Imaging   MRI brain w wo  1. No evidence of intracranial metastatic disease. 2. Mild multifocal T2 FLAIR hyperintense signal abnormality within the cerebral white matter, nonspecific but most often secondary to chronic small vessel ischemia. 3. Paranasal sinus disease as described.   01/27/2023 Imaging   US  left axillary No sonographic evidence of malignancy at the site of palpable concern in the LEFT axilla.    04/22/2023 Imaging   CT chest abdomen pelvis w contrast showed 1. New right chest wall soft tissue lesion which may represent locally recurrent or metastatic disease. Attention on follow-up. 2. New right seventh rib sclerotic osseous lesion. 3. Splenomegaly.    05/11/2023 Procedure   CT guided biopsy of right axillary nodule   BENIGN FIBROVASCULAR TISSUE.       NO LYMPHOID TISSUE IDENTIFIED.       NEGATIVE FOR MALIGNANCY.    08/19/2023 Imaging   PET scan showed 1. 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 2. Small but hypermetabolic right level IIA, right level IIb, right level V, and bilateral inguinal lymph nodes, suspicious for metastatic disease. 3.  Progressive perihepatic ascites, splenomegaly, and findings of portal venous hypertension including upstream varices adjacent to the distal esophagus and collateral vessels/varices in the gastrohepatic ligament. Progressive edema along the gastrohepatic ligament, peripancreatic region, and retroperitoneum. 4. Nonobstructive bilateral nephrolithiasis. 5.  Aortic Atherosclerosis (ICD10-I70.0).     09/11/2023 Procedure   1. Lymph node, needle/core biopsy, right neck :      -  METASTATIC POORLY DIFFERENTIATED CARCINOMA MORPHOLOGIC CONSISTENT WITH THE      CLINICAL  HISTORY OF BREAST PRIMARY (HISTORY OF INVASIVE LOBULAR CARCINOMA OF      BILATERAL BREAST).      NOTE: CLINICAL HISTORY OF STAGE IV BREAST CARCINOMA (INVASIVE LOBULAR OF BOTH      LEFT AND RIGHT) IS NOTED.  THE FINDINGS ARE MORPHOLOGICALLY CONSISTENT WITH      METASTATIC BREAST CARCINOMA.  NOTE CONFIRMATORY IMMUNOHISTOCHEMICAL STAINS ARE PERFORMED GIVEN THE MORPHOLOGIC IMPRESSION AND TO PRESERVE TISSUE FOR CLINICIAN  DIRECTED TESTING.    Malignant neoplasm of lower-outer quadrant of right breast of female, estrogen receptor positive (HCC)  08/06/2021 Mammogram   08/06/2021, digital bilateral mammogram and ultrasound showed left breast 3:00 mass, 1.7 x 1.7 x 1.7 cm, ultrasound of the left axillary is negative. 08/21/2021 right breast 1 x 0.7 x 0.8 cm angulated spiculated mass at the right breast 7:00, 5 cm from nipple.  Ultrasound of the right axillary demonstrates 3 abnormal thickened cortex lymph node. 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 Lael Hines right breast 7:00, invasive mammary carcinoma with lobular features, in situ carcinoma,  ER+, PR+, HER 2-This was found to be concordant by Dr. Lael Hines. right axilla lymph node +, extracapsular extension.  -This was found to be concordant by Dr. Lael Hines   08/28/2021 Initial Diagnosis   Malignant neoplasm of lower-outer quadrant of right breast of female, estrogen receptor positive (HCC)   08/28/2021 Imaging   CT CHEST, ABDOMEN, AND PELVIS WITH CONTRAST Asymmetric mildly enlarged right axillary nodes. Correlate with biopsy. There are some punctate foci of sclerosis in the included spine that could reflect subtle metastatic disease. Nonobstructing bilateral renal calculi.   08/29/2021 Imaging   BILATERAL BREAST MRI WITH AND WITHOUT CONTRAST 1. 2.5 centimeter enhancing  mass in the LOWER OUTER QUADRANT of the RIGHT breast, correlating with known malignancy. 2. At least 4 enlarged RIGHT axillary lymph nodes, correlating well with recently biopsied lymph node showing metastatic disease. 3. 3.4 centimeter enhancing mass in the LEFT breast, with an additional 2.9 centimeters of non mass enhancement posterior to the mass also suspicious for malignancy. This likely correlates with the area calcifications seen mammographically. 4. 5 millimeters satellite nodule along the LATERAL aspect of known malignancy in the LOWER OUTER QUADRANT of the LEFT breast. 5. LEFT axilla is negative for adenopathy.       09/05/2021 Imaging   NUCLEAR MEDICINE WHOLE BODY BONE SCAN Uptake at L2 which is nonspecific but may be related to advanced degenerative disc and facet disease changes at both L1-L2 and L2-L3; no evidence of osseous metastatic disease by CT. No definite osseous metastatic lesions identified.   09/09/2021 Oncotype testing   [id number] block RIGHT 7:00 5 CM FN; ULTRASOUND-GUIDED BIOPSY Oncotype Dx RS score 22    Genetic Testing   Negative genetic testing. No pathogenic variants identified on the [facility] CancerNext-Expanded+RNA panel. The report date is 10/03/2021.  The  CancerNext-Expanded + RNAinsight gene panel offered by [employer] and includes sequencing and rearrangement analysis for the following 77 genes: IP, ALK, APC*, ATM*, AXIN2, BAP1, BARD1, BLM, BMPR1A, BRCA1*, BRCA2*, BRIP1*, CDC73, CDH1*,CDK4, CDKN1B, CDKN2A, CHEK2*, CTNNA1, DICER1, FANCC, FH, FLCN, GALNT12, KIF1B, LZTR1, MAX, MEN1, MET, MLH1*, MSH2*, MSH3, MSH6*, MUTYH*, NBN, NF1*, NF2, NTHL1, PALB2*, PHOX2B, PMS2*, POT1, PRKAR1A, PTCH1, PTEN*, RAD51C*, RAD51D*,RB1, RECQL, RET, SDHA, SDHAF2, SDHB, SDHC, SDHD, SMAD4, SMARCA4, SMARCB1, SMARCE1, STK11, SUFU, TMEM127, TP53*,TSC1, TSC2, VHL and XRCC2 (sequencing and deletion/duplication); EGFR, EGLN1, HOXB13, KIT, MITF, PDGFRA, POLD1 and POLE (sequencing only);  EPCAM and GREM1 (deletion/duplication only).   11/15/2021 Surgery   She underwent left simple mastectomy and right modified mastectomy.   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  pT2 pN1a, ER +90%, PR +90%, HER2 negative (1+)  # Right Breast invasive lobular carcinoma Grade 2, negative margins, right axillary lymph nodes  30/32 involved with metastatic carcinoma, pT1c pN3a ER +90%, PR +51-90%, HER2 negative (1+)    11/25/2021 Cancer Staging   Staging form: Breast, AJCC 8th Edition - Pathologic stage from 11/25/2021: Stage IIIA (pT1c, pN3a, cM0, G2, ER+, PR+, HER2-) - Signed by Babara Call, MD on 11/25/2021 Stage prefix: Initial diagnosis Multigene prognostic tests performed: None Histologic grading system: 3 grade system   12/10/2021 Imaging   PET scan  1. Interval bilateral mastectomy and right axillary node dissection.No evidence of residual disease in the chest wall, nodal metastases or distant metastases. 2. Focal hypermetabolic activity in the proximal rectum corresponding with an intraluminal polypoid lesion, suspicious for a villous adenoma or early colon cancer. Sigmoidoscopy/colonoscopy recommended unless recently performed   12/10/2021 Imaging   1. Interval bilateral mastectomy and right axillary node dissection.No evidence of residual disease in the chest wall, nodal metastasesor distant metastases. 2. Focal hypermetabolic activity in the proximal rectum corresponding with an intraluminal polypoid lesion, suspicious for a villous adenoma or early colon cancer. Sigmoidoscopy/colonoscopy recommended unless recently performed    She is a poor historian. History of major depression, psychosis, previously on olanzapine  and Remeron  not currently on any and if not currently  following up with psychiatrist. Patient's family history is positive for sister with breast cancer   INTERVAL HISTORY Sharon Woods is a 63 y.o. female who has above history reviewed by me today presents for follow up visit for management of bilateral breast cancer She denies pain today  She is accompanied by her husband. + diarrhea, 2-3 times per day.  She takes Capivasertib , tolerates well.  S/p EGD in August 2025     Review of Systems  Constitutional:  Negative for appetite change, chills, fatigue and fever.  HENT:   Negative for hearing loss and voice change.   Eyes:  Negative for eye problems.  Respiratory:  Negative for chest tightness and cough.   Cardiovascular:  Negative for chest pain.  Gastrointestinal:  Positive for abdominal distention and diarrhea. Negative for abdominal pain and blood in stool.  Endocrine: Negative for hot flashes.  Genitourinary:  Negative for difficulty urinating and frequency.   Musculoskeletal:  Negative for arthralgias.  Skin:  Negative for itching and rash.  Neurological:  Negative for extremity weakness.  Hematological:  Negative for adenopathy.  Psychiatric/Behavioral:  Negative for confusion.     MEDICAL HISTORY:  Past Medical History:  Diagnosis Date   Anxiety  Cancer (HCC)    Depression    Diabetes mellitus without complication (HCC)    History of high cholesterol 2000   Hyperlipidemia    Hypertension    Patient denies medical problems    Thrombocytopenia (HCC)     SURGICAL HISTORY: Past Surgical History:  Procedure Laterality Date   ABDOMINAL HYSTERECTOMY     AXILLARY SENTINEL NODE BIOPSY Left 11/15/2021   Procedure: AXILLARY SENTINEL NODE BIOPSY;  Surgeon: Lane Shope, MD;  Location: [facility] ORS;  Service: General;  Laterality: Left;   BREAST BIOPSY Right 08/21/2021   us  bx 7:00 mass coil clip path pending   BREAST BIOPSY Right 08/21/2021   us  bx of LN, hydro marker, path pending   BREAST BIOPSY Left 08/21/2021    us  bx, heart marker, path pending   CESAREAN SECTION     x2   COLONOSCOPY WITH PROPOFOL  N/A 07/25/2022   Procedure: COLONOSCOPY WITH PROPOFOL ;  Surgeon: Therisa Bi, MD;  Location: [facility] ENDOSCOPY;  Service: Gastroenterology;  Laterality: N/A;   ESOPHAGOGASTRODUODENOSCOPY N/A 11/02/2023   Procedure: EGD (ESOPHAGOGASTRODUODENOSCOPY);  Surgeon: Therisa Bi, MD;  Location[facility] ENDOSCOPY;  Service: Gastroenterology;  Laterality: N/A;   MASTECTOMY     MASTECTOMY MODIFIED RADICAL Right 11/15/2021   Procedure: MASTECTOMY MODIFIED RADICAL;  Surgeon: Lane Shope, MD;  Location: [facility] ORS;  Service: General;  Laterality: Right;   NO PAST SURGERIES     TOTAL MASTECTOMY Left 11/15/2021   Procedure: TOTAL MASTECTOMY;  Surgeon: Lane Shope, MD;  Location: [facility] ORS;  Service: General;  Laterality: Left;    SOCIAL HISTORY: Social History   Socioeconomic History   Marital status: Married    Spouse name: Not on file   Number of children: Not on file   Years of education: Not on file   Highest education level: Not on file  Occupational History   Not on file  Tobacco Use   Smoking status: Former    Current packs/day: 0.00    Types: Cigarettes    Quit date: 2013    Years since quitting: 12.6    Passive exposure: Current   Smokeless tobacco: Never  Vaping Use   Vaping status: Never Used  Substance and Sexual Activity   Alcohol use: Not Currently    Comment: occasionally   Drug use: No   Sexual activity: Not Currently    Comment: unable to assess   Other Topics Concern   Not on file  Social History Narrative   Not on file   Social Drivers of Health   Financial Resource Strain: Medium Risk (10/05/2023)   Received [facility]   Overall Financial Resource Strain (CARDIA)    Difficulty of Paying Living Expenses: Somewhat hard  Food Insecurity: No Food Insecurity (10/05/2023)   Received from [facility]   Hunger Vital Sign    Within the past 12  months, you worried that your food would run out before you got the money to buy more.: Never true    Within the past 12 months, the food you bought just didn't last and you didn't have money to get more.: Never true  Transportation Needs: No Transportation Needs (10/05/2023)   Received from [facility] - Transportation    In the past 12 months, has lack of transportation kept you from medical appointments or from getting medications?: No    Lack of Transportation (Non-Medical): No  Physical Activity: Inactive (10/10/2021)   Exercise Vital Sign  Days of Exercise per Week: 0 days    Minutes of Exercise per Session: 0 min  Stress: Stress Concern Present (10/10/2021)   [employer] of Occupational [employer] - Occupational Stress Questionnaire    Feeling of Stress : To some extent  Social Connections: Socially Isolated (10/10/2021)   Social Connection and Isolation Panel    Frequency of Communication with Friends and Family: Once a week    Frequency of Social Gatherings with Friends and Family: Never    Attends Religious Services: Never    Diplomatic Services operational officer: No    Attends Engineer, structural: Never    Marital Status: Married  Catering manager Violence: Not on file    FAMILY HISTORY: Family History  Problem Relation Age of Onset   Lung cancer Mother    Dementia Mother    Parkinson's disease Father    Cancer Father        unk type   Diabetes Sister    Heart attack Sister    Breast cancer Sister    Cancer Maternal Uncle        unk types   Dementia Maternal Grandmother    Cancer Maternal Grandmother        unk type   Cancer Other    Dementia Other     ALLERGIES:  is allergic to tylenol  [acetaminophen ] and aleve [naproxen].  MEDICATIONS:  Current Outpatient Medications  Medication Sig Dispense Refill   ACCU-CHEK GUIDE test strip USE TO CHECK BLOOD SUGAR UP TO 4 TIMES DAILY AS DIRECTED 100 each 0   Accu-Chek Softclix  Lancets lancets USE TO CHECK BLOOD SUAGR UP TO 4 TIMES DAILY AS DIRECTED 100 each 0   anastrozole  (ARIMIDEX ) 1 MG tablet Take 1 tablet by mouth once daily 90 tablet 0   blood glucose meter kit and supplies KIT Dispense based on patient and insurance preference. Use up to four times daily as directed. 1 each 1   calcium  carbonate (OS-CAL) 600 MG TABS tablet Take 2 tablets (1,200 mg total) by mouth daily. 60 tablet 5   capivasertib  (TRUQAP ) 160 MG pack Take 2 tablets (320 mg total) by mouth 2 (two) times daily. Take for 4 days, then hold for 3 days. Repeat every 7 days. 64 tablet 0   cyanocobalamin  (VITAMIN B12) 1000 MCG tablet Take 1 tablet (1,000 mcg total) by mouth daily. 30 tablet 2   escitalopram  (LEXAPRO ) 20 MG tablet Take 1 tablet (20 mg total) by mouth daily. 90 tablet 1   famotidine  (PEPCID ) 40 MG tablet Take 1 tablet (40 mg total) by mouth daily. 90 tablet 1   hydrOXYzine  (ATARAX ) 10 MG tablet TAKE 1 TABLET BY MOUTH TWICE DAILY AS NEEDED FOR ITCHING OR ANXIETY 180 tablet 0   Iron -Vitamin C  65-125 MG TABS Take 1 tablet by mouth daily. 30 tablet 2   metFORMIN  (GLUCOPHAGE ) 500 MG tablet Take 1 tablet (500 mg total) by mouth 2 (two) times daily with a meal. 180 tablet 1   ondansetron  (ZOFRAN ) 8 MG tablet Take 1 tablet (8 mg total) by mouth every 8 (eight) hours as needed for nausea or vomiting. 20 tablet 2   potassium chloride  SA (KLOR-CON  M) 20 MEQ tablet Take 1 tablet (20 mEq total) by mouth daily. 3 tablet 0   rosuvastatin  (CRESTOR ) 5 MG tablet Take 1 tablet by mouth once daily 90 tablet 1   traMADol  (ULTRAM ) 50 MG tablet Take 1 tablet (50 mg total) by mouth every 6 (six) hours  as needed. 30 tablet 0   triamcinolone  ointment (KENALOG ) 0.5 % Apply 1 Application topically 2 (two) times daily. 30 g 1   Current Facility-Administered Medications  Medication Dose Route Frequency Provider Last Rate Last Admin   cefTRIAXone  (ROCEPHIN ) injection 500 mg  500 mg Intramuscular Once Bernardo Fend,  DO         PHYSICAL EXAMINATION: ECOG PERFORMANCE STATUS: 1 - Symptomatic but completely ambulatory Vitals:   11/11/23 1400  BP: 134/82  Pulse: 76  Resp: 18  Temp: 97.6 F (36.4 C)    There were no vitals filed for this visit.    Physical Exam Constitutional:      General: She is not in acute distress. HENT:     Head: Normocephalic and atraumatic.  Eyes:     General: No scleral icterus. Cardiovascular:     Rate and Rhythm: Normal rate.  Pulmonary:     Effort: Pulmonary effort is normal. No respiratory distress.     Breath sounds: No wheezing.  Abdominal:     General: There is distension.  Musculoskeletal:        General: No deformity. Normal range of motion.     Cervical back: Normal range of motion and neck supple.  Skin:    Coloration: Skin is not jaundiced.  Neurological:     Mental Status: She is alert and oriented to person, place, and time. Mental status is at baseline.     LABORATORY DATA:  I have reviewed the data as listed    Latest Ref Rng & Units 11/11/2023    1:56 PM 10/21/2023    1:59 PM 09/29/2023    3:25 PM  CBC  WBC 4.0 - 10.5 K/uL 3.4  3.3  2.1   Hemoglobin 12.0 - 15.0 g/dL 89.3  89.1  89.4   Hematocrit 36.0 - 46.0 % 32.4  33.0  32.4   Platelets 150 - 400 K/uL 80  82  72       Latest Ref Rng & Units 11/11/2023    1:56 PM 11/05/2023    9:58 AM 10/29/2023    2:43 PM  CMP  Glucose 70 - 99 mg/dL 705  804  855   BUN 8 - 23 mg/dL 23  19  20    Creatinine 0.44 - 1.00 mg/dL 9.02  9.06  9.00   Sodium 135 - 145 mmol/L 135  140  136   Potassium 3.5 - 5.1 mmol/L 3.7  3.2  3.4   Chloride 98 - 111 mmol/L 103  107  103   CO2 22 - 32 mmol/L 23  25  24    Calcium  8.9 - 10.3 mg/dL 8.7  9.0  9.1   Total Protein 6.5 - 8.1 g/dL 6.8     Total Bilirubin 0.0 - 1.2 mg/dL 0.6     Alkaline Phos 38 - 126 U/L 89     AST 15 - 41 U/L 21     ALT 0 - 44 U/L 12       RADIOGRAPHIC STUDIES: I have personally reviewed the radiological images as listed and agreed with  the findings in the report. US  LIVER DOPPLER Result Date: 10/05/2023 CLINICAL DATA:  Portal hypertension EXAM: DUPLEX ULTRASOUND OF LIVER TECHNIQUE: Color and duplex Doppler ultrasound was performed to evaluate the hepatic in-flow and out-flow vessels. COMPARISON:  CT abdomen pelvis 05/28/2023 FINDINGS: Liver: Mildly coarsened parenchymal echogenicity. Subtle nodularity of hepatic contours. No focal lesion, mass or intrahepatic biliary ductal dilatation. Main Portal Vein size: 1.2  cm Portal Vein Velocities Main Prox:  37 cm/sec Main Mid: 35 cm/sec Main Dist:  40 cm/sec Right: 29 cm/sec Left: 25 cm/sec Hepatic Vein Velocities Right:  21 cm/sec Middle:  21 cm/sec Left:  20 cm/sec IVC: Present and patent with normal respiratory phasicity. Hepatic Artery Velocity:  31 cm/sec Splenic Vein Velocity:  22 cm/sec Spleen: 19 cm x 20 cm x 8 cm with a total volume of 1487 cm^3 (411 cm^3 is upper limit normal) Portal Vein Occlusion/Thrombus: No Splenic Vein Occlusion/Thrombus: No Ascites: Mild perihepatic ascites. Varices: None IMPRESSION: 1. Patent portal vein with appropriate direction of flow. 2. Splenomegaly. 3. Mild perihepatic ascites. 4.  Cirrhotic liver morphology without focal hepatic lesion. Electronically Signed   By: Aliene Lloyd M.D.   On: 10/05/2023 16:46   US  CORE BIOPSY (LYMPH NODES) Result Date: 09/11/2023 INDICATION: Enlarged right neck lymph node EXAM: ULTRASOUND GUIDED core needle BIOPSY OF cervical lymph node MEDICATIONS: None. ANESTHESIA/SEDATION: 1% lidocaine  PROCEDURE: The procedure, risks, benefits, and alternatives were explained to the patient. Questions regarding the procedure were encouraged and answered. The patient understands and consents to the procedure. The operative field was prepped with chlorhexidine  in a sterile fashion, and a sterile drape was applied covering the operative field. A sterile gown and sterile gloves were used for the procedure. Local anesthesia was provided with 1%  Lidocaine . COMPLICATIONS: None immediate. FINDINGS: With patient in a supine position head of bed elevated proximally 45 degrees, the large neck lymph node was identified. Patient's skin was marked, prepped, and draped in the usual sterile fashion. Anesthesia was achieved by infiltrating 1% lidocaine  into the soft tissue adjacent to the lymph node. Although enlarged, the lymph node was highly mobile and difficult to engage with the biopsy needle. Once his tissue was engaged, a core needle biopsy was obtained. Two total biopsies performed. Biopsies were performed by advancing the biopsy needle through a small incision in the patient's skin until the biopsy needle was visualized within the lymph node. 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 EXAM: ULTRASOUND ABDOMEN LIMITED FINDINGS: Imaging of all 4 quadrants of the abdomen reveals only small volume perisplenic ascites. There is no safe window for paracentesis. IMPRESSION: Only trace perisplenic ascites.  Paracentesis was deferred. Performed By Lavanda Jurist, PA-C Electronically Signed   By: Wilkie Lent M.D.   On: 08/28/2023 10:24   NM PET Image Restage (PS) Skull Base to Thigh (F-18 FDG) Result Date: 08/25/2023 CLINICAL DATA:  Subsequent treatment strategy for pros cancer. EXAM: NUCLEAR MEDICINE PET SKULL BASE TO THIGH TECHNIQUE: 9.9 mCi F-18 FDG was injected intravenously. Full-ring PET imaging was performed from the skull base to thigh after the radiotracer. CT data was obtained and used for attenuation correction and anatomic localization. Fasting blood glucose: 111 mg/dl COMPARISON:  94/91/7975 FINDINGS: Mediastinal blood pool activity: SUV max 2.4 Liver activity: SUV max NA NECK: 0.7 cm with  right level IIa lymph node with maximum SUV 5.2. Small scattered right level IIb and V lymph nodes including a 0.6 cm right level V lymph node on image 18 series 6 with maximum SUV 3.8. Incidental CT findings: None. CHEST: Soft tissue density favoring scarring along the lateral margin of the right pectoralis muscle and extending along the axilla adjacent to the inferior margin of  the neurovascular structures, maximum SUV 2.6, probably therapy related. Incidental CT findings: Bilateral mastectomies. ABDOMEN/PELVIS: Portacaval node 1.1 cm in short axis on image 84 series 6, maximum SUV 5.6. Scattered presumed physiologic activity in bowel. Small but hypermetabolic bilateral inguinal lymph nodes, left inguinal node 0.6 cm in short axis on image 131 series 6 with maximum SUV 5.7. Incidental CT findings: Progressive perihepatic ascites. Findings of portal venous hypertension including uphill varices adjacent to the distal esophagus and collateral vessels/varices in the gastrohepatic ligament. Progressive edema along the gastrohepatic ligament, peripancreatic region, and retroperitoneum. Mild perisplenic ascites along with splenomegaly. Nonobstructive bilateral nephrolithiasis. Atherosclerosis is present, including aortoiliac atherosclerotic disease. Small amount of pelvic ascites above the urinary bladder. 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 These lesions are mostly lytic. Incidental CT findings: None. IMPRESSION: 1. 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 2. Small but hypermetabolic right level IIA, right level IIb, right level V, and bilateral inguinal lymph nodes, suspicious for metastatic disease.  3. Progressive perihepatic ascites, splenomegaly, and findings of portal venous hypertension including upstream varices adjacent to the distal esophagus and collateral vessels/varices in the gastrohepatic ligament. Progressive edema along the gastrohepatic ligament, peripancreatic region, and retroperitoneum. 4. Nonobstructive bilateral nephrolithiasis. 5.  Aortic Atherosclerosis (ICD10-I70.0). Electronically Signed   By: Ryan Salvage M.D.   On: 08/25/2023 10:40

## 2023-11-11 NOTE — Assessment & Plan Note (Signed)
 Hyperglycemia is worsen due to capivasertinb.  Results will be sent to pcp, appreciate co-management of glycemic control.

## 2023-11-11 NOTE — Assessment & Plan Note (Signed)
 Follow up with GI [facility] for EGD varices screening.

## 2023-11-11 NOTE — Assessment & Plan Note (Signed)
 Due to splenomegaly.  17cm Previous bone marrow biopsy showed mildly hypercellular bone marrow with otherwise orderly trilineage hematopoiesis. A definitive morphologic etiology for this pancytopenia is not noted. Negative cytogenetics, negative myeloid FISH testing. Continue B12 supplementation.

## 2023-11-11 NOTE — Assessment & Plan Note (Addendum)
 Bilateral breast invasive lobular carcinoma, s/p bilateral mastectomy. [name]   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  PET scan showed progressive disease including scattered hypermetabolic bone lesions, small but hypermetabolic lymphadenopathy, concerning for disease progression. CT-guided right cervical lymphadenopathy pathology showed metastatic poorly differentiated carcinoma, with morphology consistent with breast primary.  NGS PIK3CA, CDH1, TMB 13.2 pMMR, results are pending.  ER/PR/HER2 staining was not done.- Recommend to continue monthly fulvestrant  injection  She tolerates Capivasertib  320mg  BID D1-4 every 7 days.  Monitor glucose level

## 2023-11-11 NOTE — Assessment & Plan Note (Signed)
 See above plan.

## 2023-11-11 NOTE — Assessment & Plan Note (Signed)
 Tramadol 50mg  Q6h PRN

## 2023-11-11 NOTE — Assessment & Plan Note (Signed)
 She developed diarrhea before state of Capivasertib  Discussed about Imodium PRN instructions.

## 2023-11-12 IMAGING — MG DIGITAL DIAGNOSTIC BILAT W/ TOMO W/ CAD
8 of 14 series · 8 of 40 positions shown · non-contrast
Comparison: Previous exam(s).

CLINICAL DATA: Palpable lump left breast



[R CC synth-2D]
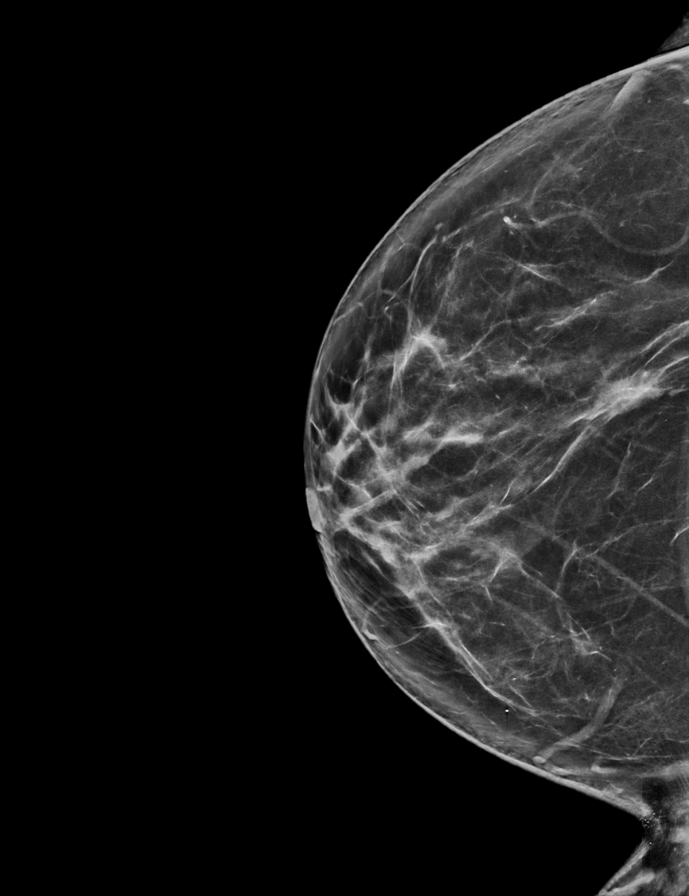

[L CC synth-2D (1 of 2)]
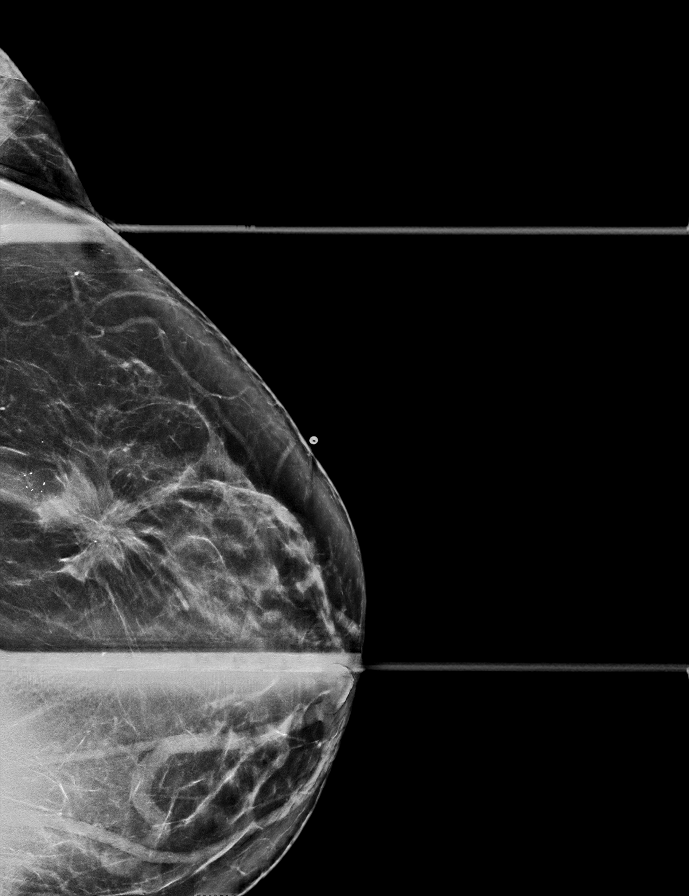

[R MLO synth-2D]
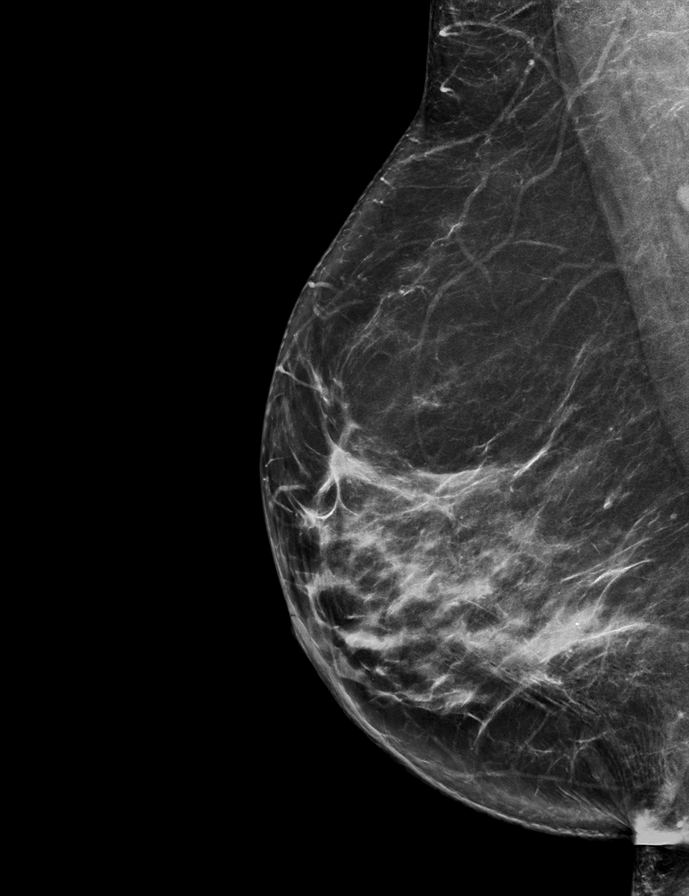

[L XCCL synth-2D]
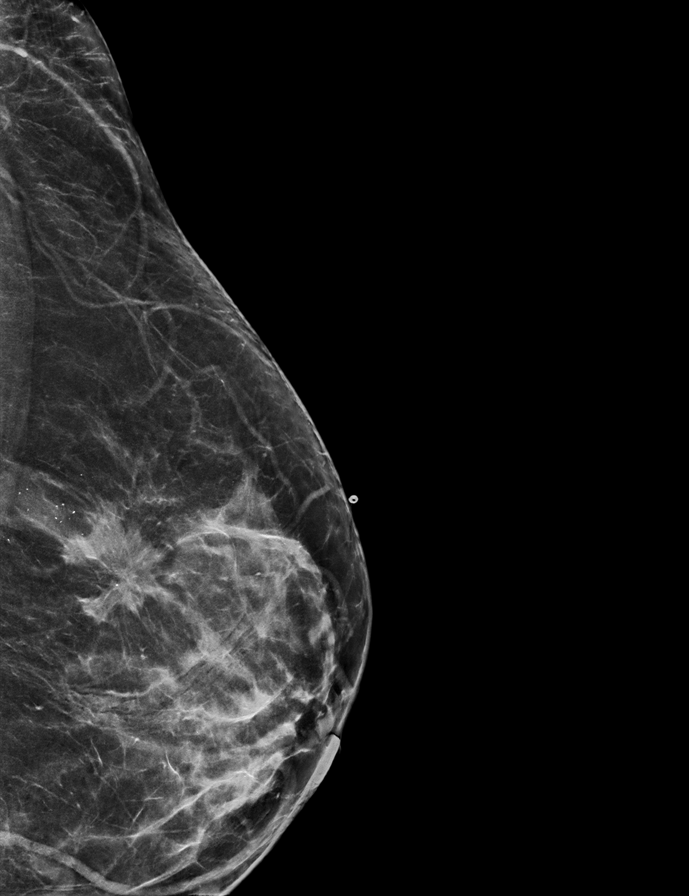

[L MLO synth-2D (1 of 2)]
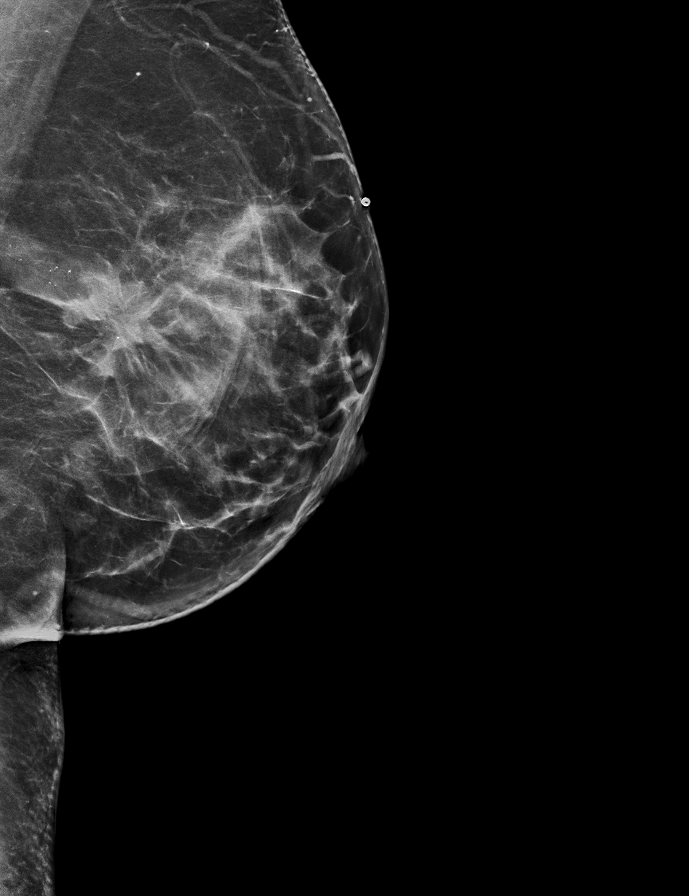

[L MLO synth-2D (2 of 2)]
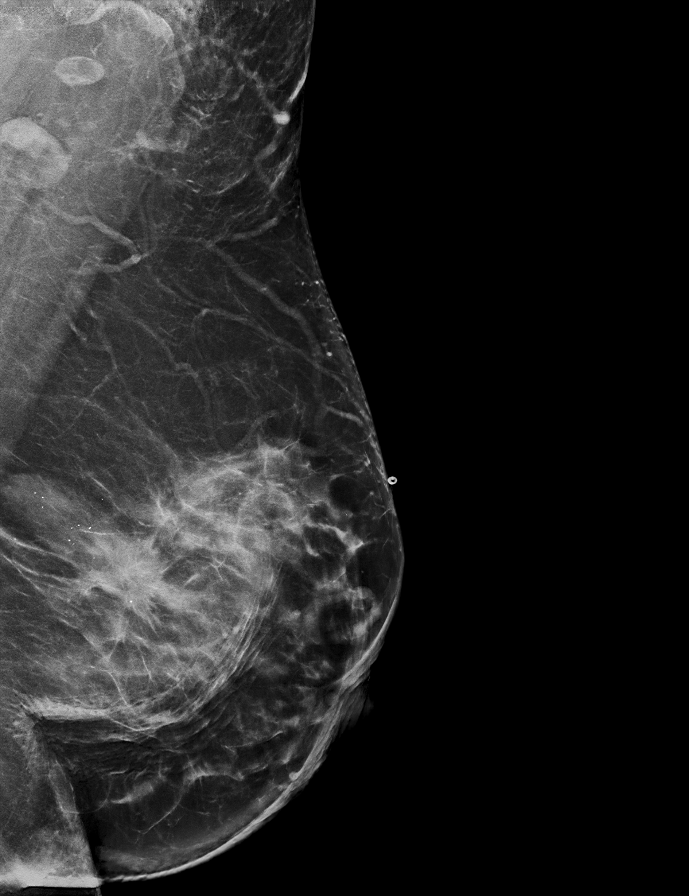

[L CC synth-2D (2 of 2)]
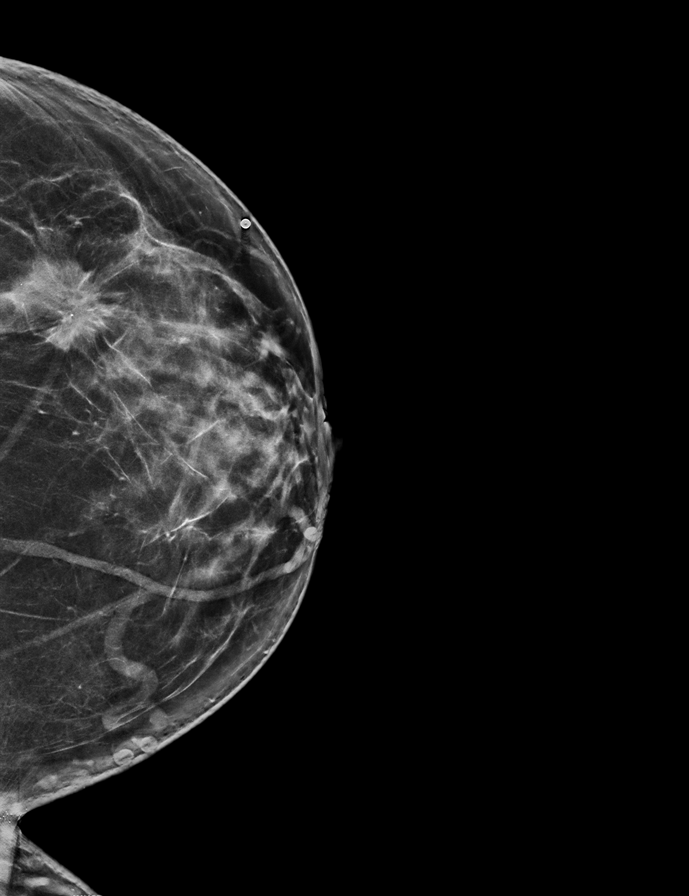

[L MLO tomo · tomo slice 44/87.0]
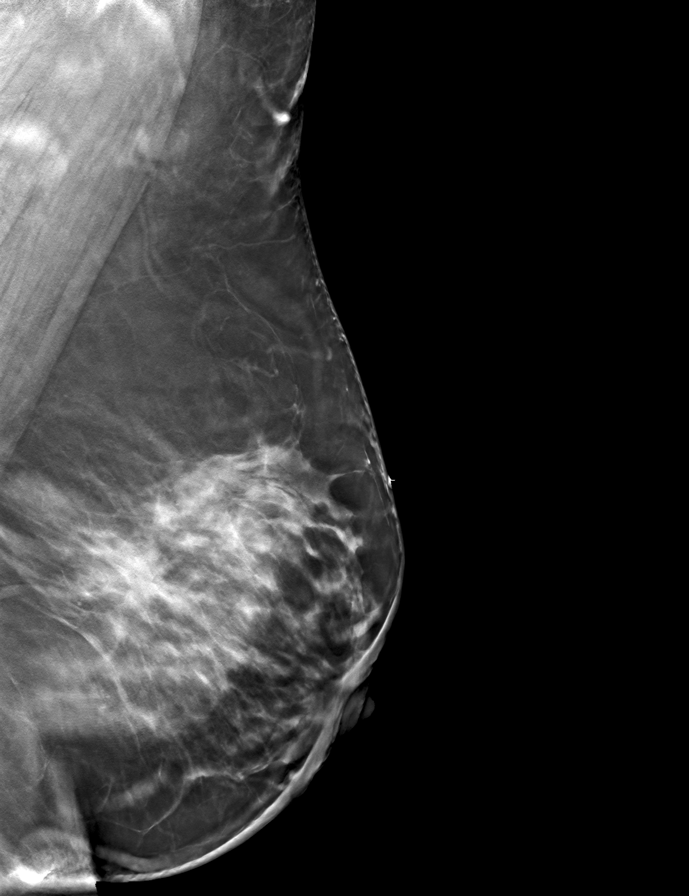

[8 of 40 positions shown; findings below may reference images not displayed]

ACR Breast Density Category c: The breast tissue is heterogeneously
dense, which may obscure small masses.
FINDINGS: Cc and MLO views of bilateral breasts, spot tangential view of left
breast are submitted. At the palpable area lateral left breast,
there is a spiculated mass with associated calcifications. In the
slight lateral lower right breast, there is a spiculated mass.

Targeted ultrasound is performed, showing angulated hypoechoic mass
at palpable area left breast 3 o'clock cm from nipple measuring
x 1.7 x 1.6 cm correlating to. Ultrasound of the left axilla is
negative.

Ultrasound right breast demonstrates a 1 x 0.7 x 0.8 cm angulated
spiculated mass at right breast 7 o'clock 5 cm from nipple
correlating to the mammographic mass. Ultrasound of the right axilla
demonstrates at 3 abnormal thickened cortex lymph node.
IMPRESSION: Highly suspicious findings.

RECOMMENDATION:
Recommend ultrasound-guided core biopsies x3 of bilateral breast
mass and abnormal right axillary lymph node.

I have discussed the findings and recommendations with the patient.
If applicable, a reminder letter will be sent to the patient
regarding the next appointment.

BI-RADS CATEGORY  5: Highly suggestive of malignancy.

## 2023-11-12 IMAGING — US US BREAST*R* LIMITED INC AXILLA
1 series · 10 of 10 positions shown · non-contrast
Comparison: Previous exam(s).

CLINICAL DATA: Palpable lump left breast



[Series 1: us breast*right* limited inc axilla · 0.06mm/px · 10 of 10 slices shown]
[im 1/10]
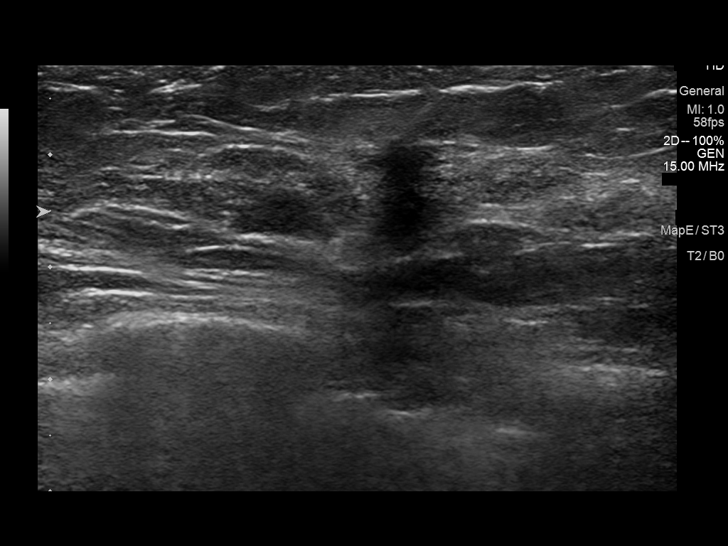
[im 2/10]
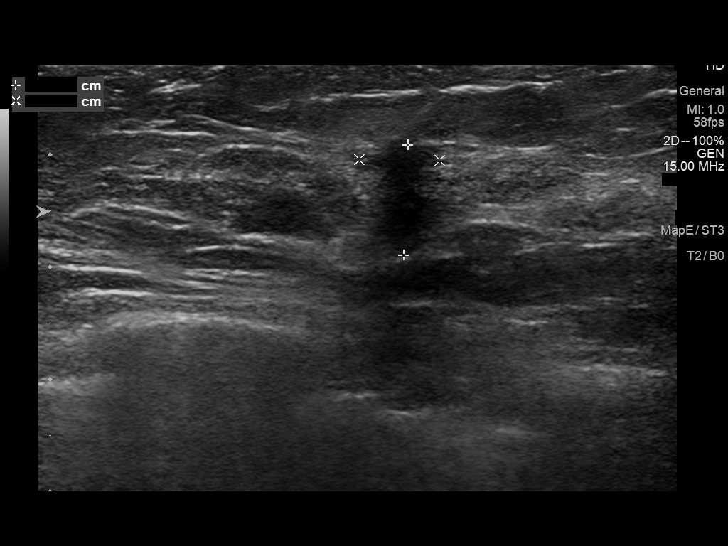
[im 3/10]
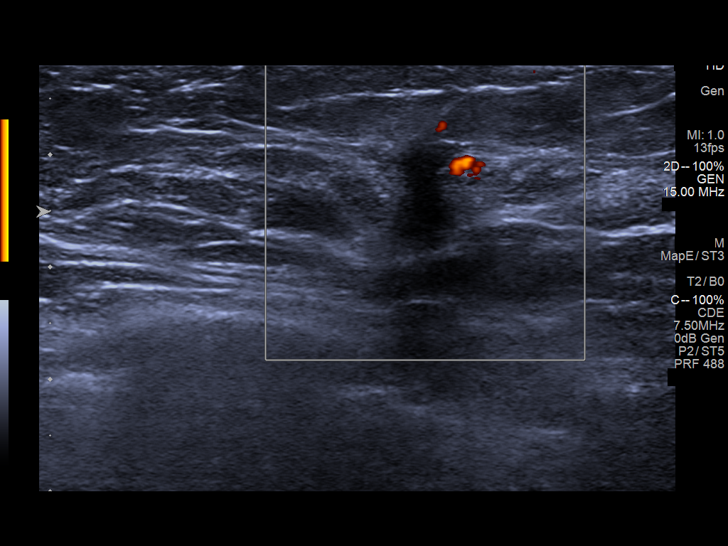
[im 4/10]
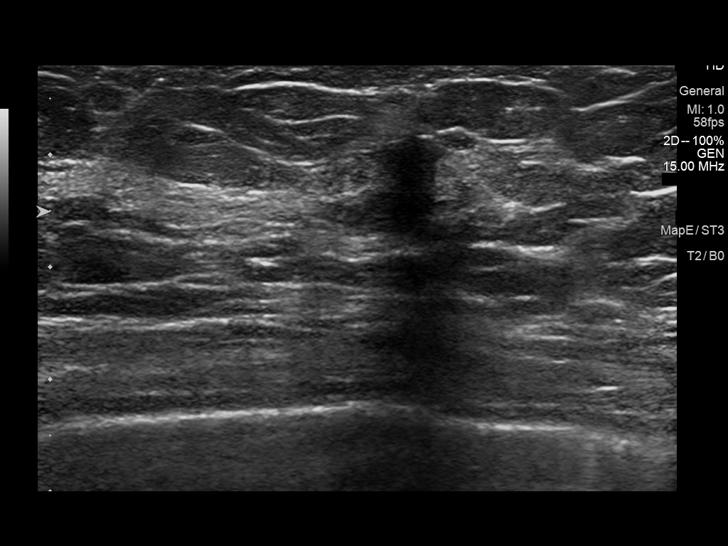
[im 5/10]
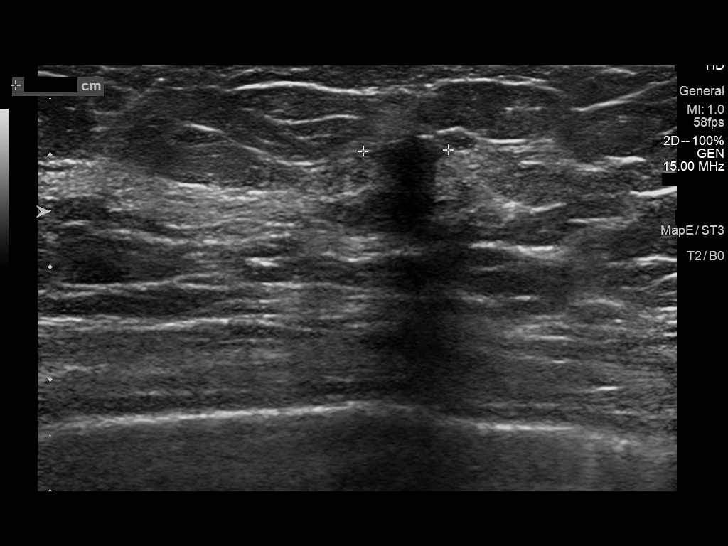
[im 6/10]
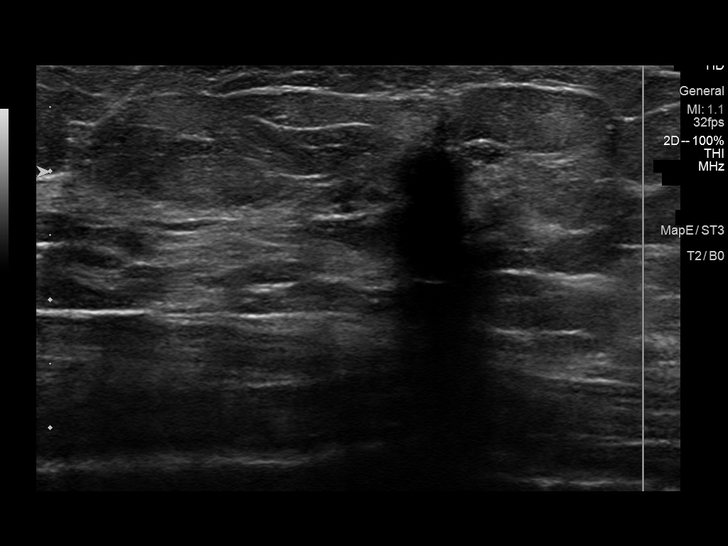
[im 7/10]
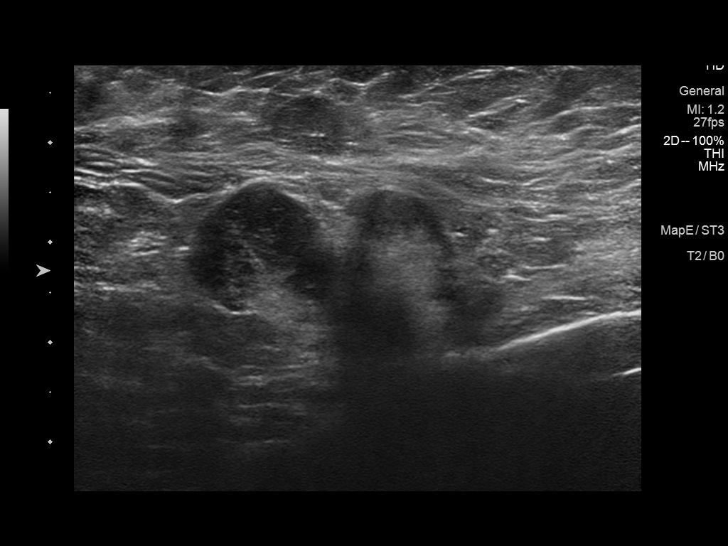
[im 8/10]
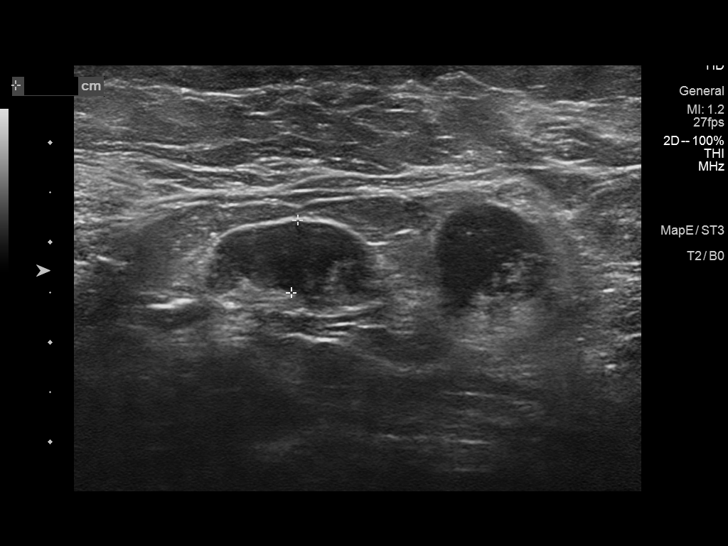
[im 9/10]
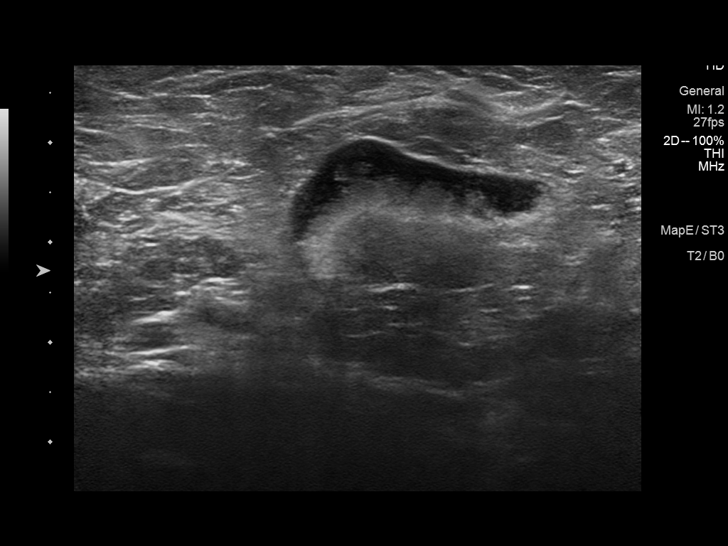
[im 10/10]
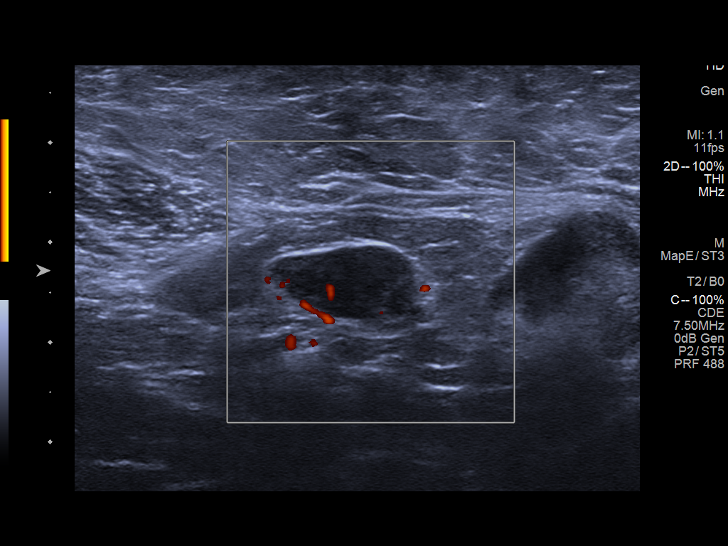

[10 of 10 positions shown; findings below may reference images not displayed]

ACR Breast Density Category c: The breast tissue is heterogeneously
dense, which may obscure small masses.
FINDINGS: Cc and MLO views of bilateral breasts, spot tangential view of left
breast are submitted. At the palpable area lateral left breast,
there is a spiculated mass with associated calcifications. In the
slight lateral lower right breast, there is a spiculated mass.

Targeted ultrasound is performed, showing angulated hypoechoic mass
at palpable area left breast 3 o'clock cm from nipple measuring
x 1.7 x 1.6 cm correlating to. Ultrasound of the left axilla is
negative.

Ultrasound right breast demonstrates a 1 x 0.7 x 0.8 cm angulated
spiculated mass at right breast 7 o'clock 5 cm from nipple
correlating to the mammographic mass. Ultrasound of the right axilla
demonstrates at 3 abnormal thickened cortex lymph node.
IMPRESSION: Highly suspicious findings.

RECOMMENDATION:
Recommend ultrasound-guided core biopsies x3 of bilateral breast
mass and abnormal right axillary lymph node.

I have discussed the findings and recommendations with the patient.
If applicable, a reminder letter will be sent to the patient
regarding the next appointment.

BI-RADS CATEGORY  5: Highly suggestive of malignancy.

## 2023-11-16 ENCOUNTER — Other Ambulatory Visit: Payer: Self-pay

## 2023-11-19 ENCOUNTER — Other Ambulatory Visit: Payer: Self-pay | Admitting: Pharmacy Technician

## 2023-11-19 ENCOUNTER — Other Ambulatory Visit: Payer: Self-pay

## 2023-11-19 ENCOUNTER — Other Ambulatory Visit: Payer: Self-pay | Admitting: Oncology

## 2023-11-19 DIAGNOSIS — C50912 Malignant neoplasm of unspecified site of left female breast: Secondary | ICD-10-CM

## 2023-11-19 NOTE — Progress Notes (Signed)
 Specialty Pharmacy Refill Coordination Note  Sharon Woods is a 63 y.o. female contacted today regarding refills of specialty medication(s) Capivasertib  (TRUQAP )   Patient requested Delivery   Delivery date: 11/24/23   Verified address: [address]   [place] [address]   Medication will be filled on 11/23/23.  Spoke to patient's spouse Francis.   This fill date is pending response to refill request from provider. Patient is aware and if they have not received fill by intended date they must follow up with pharmacy.

## 2023-11-20 ENCOUNTER — Other Ambulatory Visit (HOSPITAL_COMMUNITY): Payer: Self-pay

## 2023-11-20 ENCOUNTER — Telehealth: Payer: Self-pay | Admitting: Pharmacy Technician

## 2023-11-20 ENCOUNTER — Ambulatory Visit (INDEPENDENT_AMBULATORY_CARE_PROVIDER_SITE_OTHER): Payer: MEDICAID | Admitting: Internal Medicine

## 2023-11-20 ENCOUNTER — Encounter: Payer: Self-pay | Admitting: Internal Medicine

## 2023-11-20 ENCOUNTER — Other Ambulatory Visit: Payer: Self-pay

## 2023-11-20 VITALS — BP 110/72 | HR 81 | Temp 98.0°F | Resp 15 | Ht 64.0 in | Wt 167.0 lb

## 2023-11-20 DIAGNOSIS — K219 Gastro-esophageal reflux disease without esophagitis: Secondary | ICD-10-CM | POA: Diagnosis not present

## 2023-11-20 DIAGNOSIS — Z7984 Long term (current) use of oral hypoglycemic drugs: Secondary | ICD-10-CM

## 2023-11-20 DIAGNOSIS — E785 Hyperlipidemia, unspecified: Secondary | ICD-10-CM | POA: Diagnosis not present

## 2023-11-20 DIAGNOSIS — F419 Anxiety disorder, unspecified: Secondary | ICD-10-CM | POA: Diagnosis not present

## 2023-11-20 DIAGNOSIS — E1169 Type 2 diabetes mellitus with other specified complication: Secondary | ICD-10-CM | POA: Diagnosis not present

## 2023-11-20 DIAGNOSIS — F331 Major depressive disorder, recurrent, moderate: Secondary | ICD-10-CM

## 2023-11-20 LAB — POCT GLYCOSYLATED HEMOGLOBIN (HGB A1C): Hemoglobin A1C: 7.8 % — AB (ref 4.0–5.6)

## 2023-11-20 LAB — GLUCOSE, POCT (MANUAL RESULT ENTRY): POC Glucose: 269 mg/dL — AB (ref 70–99)

## 2023-11-20 MED ORDER — FAMOTIDINE 40 MG PO TABS
40.0000 mg | ORAL_TABLET | Freq: Every day | ORAL | 1 refills | Status: AC
Start: 2023-11-20 — End: ?

## 2023-11-20 MED ORDER — METFORMIN HCL 500 MG PO TABS: 500.0000 mg | ORAL_TABLET | Freq: Two times a day (BID) | ORAL | 1 refills | Status: AC

## 2023-11-20 MED ORDER — RYBELSUS 3 MG PO TABS: 3.0000 mg | ORAL_TABLET | Freq: Every day | ORAL | 1 refills | Status: DC

## 2023-11-20 MED ORDER — ROSUVASTATIN CALCIUM 5 MG PO TABS: 5.0000 mg | ORAL_TABLET | Freq: Every day | ORAL | 1 refills | Status: DC

## 2023-11-20 MED ORDER — ESCITALOPRAM OXALATE 20 MG PO TABS
20.0000 mg | ORAL_TABLET | Freq: Every day | ORAL | 1 refills | Status: AC
Start: 2023-11-20 — End: ?

## 2023-11-20 NOTE — Telephone Encounter (Signed)
 Pharmacy Patient Advocate Encounter   Received notification from CoverMyMeds that prior authorization for Rybelsus  3MG  tablets  is required/requested.   Insurance verification completed.   The patient is insured through [employer] .   Per test claim: PA required; PA started via CoverMyMeds. KEY [id number] . Waiting for clinical questions to populate.

## 2023-11-20 NOTE — Progress Notes (Signed)
 Established Patient Office Visit  Subjective   Patient ID: Sharon Woods, female    DOB: 1960-04-20  Age: 63 y.o. MRN: [id number]  Chief Complaint  Patient presents with   Headache   Hyperglycemia    Headache   Hyperglycemia    Sharon Woods presents to follow-up on chronic medical conditions. Patient's Oncologist reached out because the patient's blood sugar was elevated to 294 on recent labs on 11/11/23.   Discussed the use of AI scribe software for clinical note transcription with the patient, who gave verbal consent to proceed.  History of Present Illness Sharon Woods is a 63 year old female with diabetes who presents with elevated blood sugar levels.  Her blood sugar is elevated at 294 mg/dL, above her usual range of 125-172 mg/dL. Her A1c has increased to 7.8% from her typical 6.4-6.6%. She takes metformin  twice daily for diabetes management. Her diet includes occasional cake with whipped cream, yogurt drinks, and fruits. She eats less due to pain and sometimes lacks appetite.  She experiences fatigue, which she associates with high blood sugar levels. She manages nausea with Zofran  and acid reflux with Pepcid , avoiding tomatoes to prevent exacerbation. She experiences a burning sensation from her stomach upwards, causing difficulty breathing.   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, CT scan yesterday now with new right chest wall soft tissue lesion which may represent locally recurrent or metastatic disease as well as new right seventh rib sclerotic osseous lesion -S/p Double mastectomy in August 2023 -Following with medical oncology, note reviewed from 01/14/23 -Still on Arimidex  but also now on Truqap  160 mg and Tramadol  for pain -Family history of mother, father and sister with breast cancer -MRI brain negative for brains mets, but with small vessel ischemia   Diabetes, Type 2: -Last A1c 6.6% 4/25 -Medications:  Metformin  500 mg BID -Patient is compliant with the above medications and reports no side effects.  -Eye exam: UTD -Foot exam: Due -Microalbumin: Due -Statin: Yes -PNA vaccine: Patient willing to have vaccine in the fall -Denies symptoms of hypoglycemia, polyuria, polydipsia, numbness extremities, foot ulcers/trauma.   HLD: -Medications: Crestor  5 mg  -Patient is compliant with medication reports no side effects -MRI of the brain showing small vessel ischemia 6/24 -Last lipid panel: Lipid Panel     Component Value Date/Time   CHOL 177 04/23/2023 1601   TRIG 148 04/23/2023 1601   HDL 62 04/23/2023 1601   CHOLHDL 2.9 04/23/2023 1601   VLDL 47 (H) 04/16/2015 0659   LDLCALC 90 04/23/2023 1601    MDD: -Mood status: stable -Current treatment: Lexapro  10 mg, Hydroxyzine  10 mg daily   -Satisfied with current treatment?: no -Symptom severity: moderate  -Duration of current treatment : months -Side effects: no Medication compliance: excellent compliance -Previous psychiatric medications: prozac , seroquel, and zyprexa  -patient states that she has been on medications for depression in the past, however her husband did not want her to be on them and she felt like a zombie so she discontinued them however she also states that she is under a lot of stress both at home and with her health Depressed mood: yes     04/23/2023    3:19 PM 10/20/2022   10:29 AM 07/21/2022   10:34 AM 05/09/2022   12:56 PM 04/15/2022   11:27 AM  Depression screen PHQ 2/9  Decreased Interest 0 0 0 0 2  Down, Depressed, Hopeless 0 0 0 0 1  PHQ -  2 Score 0 0 0 0 3  Altered sleeping  0 0  0  Tired, decreased energy  0 0  2  Change in appetite  0 0  0  Feeling bad or failure about yourself   0 0  0  Trouble concentrating  0 0  0  Moving slowly or fidgety/restless  0 0  0  Suicidal thoughts  0 0  0  PHQ-9 Score  0 0  5  Difficult doing work/chores  Not difficult at all Not difficult at all  Not difficult at all       Patient Active Problem List   Diagnosis Date Noted   Neoplasm related pain 10/21/2023   Diarrhea 10/21/2023   Liver cirrhosis (HCC) 08/26/2023   Ascites 08/26/2023   Mixed hyperlipidemia 04/23/2023   Moderate episode of recurrent major depressive disorder (HCC) 04/23/2023   Abnormal CT scan 07/25/2022   Adenomatous polyp of colon 07/25/2022   Hypokalemia 06/24/2022   Anxiety 05/25/2022   Leukopenia 03/20/2022   Blood in stool 03/13/2022   Abnormal gastrointestinal PET scan 01/10/2022   Diabetes mellitus (HCC) 12/05/2021   Status post bilateral mastectomy 11/21/2021   Genetic testing 10/07/2021   Vaginitis 09/28/2021   Thrombocytopenia (HCC) 09/08/2021   Malignant neoplasm of left breast, stage 4 (HCC) 08/28/2021   Malignant neoplasm of lower-outer quadrant of right breast of female, estrogen receptor positive (HCC) 08/28/2021   Goals of care, counseling/discussion 08/28/2021   Severe recurrent major depression with psychotic features (HCC) 11/15/2015   Hypertension 11/15/2015   Noncompliance 11/15/2015   Severe major depression, single episode, with psychotic features, mood-congruent (HCC) 03/29/2015   Protein-calorie malnutrition, severe 03/26/2015   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   Past Medical History:  Diagnosis Date   Anxiety    Cancer (HCC)    Depression    Diabetes mellitus without complication (HCC)    History of high cholesterol 2000   Hyperlipidemia    Hypertension    Patient denies medical problems    Thrombocytopenia (HCC)    Past Surgical History:  Procedure Laterality Date   ABDOMINAL HYSTERECTOMY     AXILLARY SENTINEL NODE BIOPSY Left 11/15/2021   Procedure: AXILLARY SENTINEL NODE BIOPSY;  Surgeon: Sharon Shope, MD;  Location: [facility] ORS;  Service: General;  Laterality: Left;   BREAST BIOPSY Right 08/21/2021   us  bx 7:00 mass coil clip path pending   BREAST BIOPSY Right 08/21/2021   us  bx of LN, hydro marker, path pending   BREAST BIOPSY Left  08/21/2021   us  bx, heart marker, path pending   CESAREAN SECTION     x2   COLONOSCOPY WITH PROPOFOL  N/A 07/25/2022   Procedure: COLONOSCOPY WITH PROPOFOL ;  Surgeon: Sharon Bi, MD;  Location: [facility] ENDOSCOPY;  Service: Gastroenterology;  Laterality: N/A;   ESOPHAGOGASTRODUODENOSCOPY N/A 11/02/2023   Procedure: EGD (ESOPHAGOGASTRODUODENOSCOPY);  Surgeon: Sharon Bi, MD;  Location: [facility] ENDOSCOPY;  Service: Gastroenterology;  Laterality: N/A;   MASTECTOMY     MASTECTOMY MODIFIED RADICAL Right 11/15/2021   Procedure: MASTECTOMY MODIFIED RADICAL;  Surgeon: Sharon Shope, MD;  Location: [facility] ORS;  Service: General;  Laterality: Right;   NO PAST SURGERIES     TOTAL MASTECTOMY Left 11/15/2021   Procedure: TOTAL MASTECTOMY;  Surgeon: Sharon Shope, MD;  Location: [facility] ORS;  Service: General;  Laterality: Left;   Social History   Tobacco Use   Smoking status: Former    Current packs/day: 0.00    Types: Cigarettes    Quit date: 2013  Years since quitting: 12.6    Passive exposure: Current   Smokeless tobacco: Never  Vaping Use   Vaping status: Never Used  Substance Use Topics   Alcohol use: Not Currently    Comment: occasionally   Drug use: No   Social History   Socioeconomic History   Marital status: Married    Spouse name: Not on file   Number of children: Not on file   Years of education: Not on file   Highest education level: Not on file  Occupational History   Not on file  Tobacco Use   Smoking status: Former    Current packs/day: 0.00    Types: Cigarettes    Quit date: 2013    Years since quitting: 12.6    Passive exposure: Current   Smokeless tobacco: Never  Vaping Use   Vaping status: Never Used  Substance and Sexual Activity   Alcohol use: Not Currently    Comment: occasionally   Drug use: No   Sexual activity: Not Currently    Comment: unable to assess   Other Topics Concern   Not on file  Social History Narrative   Not on file   Social Drivers of  Health   Financial Resource Strain: Medium Risk (10/05/2023)   Received from [facility]   Overall Financial Resource Strain (CARDIA)    Difficulty of Paying Living Expenses: Somewhat hard  Food Insecurity: No Food Insecurity (10/05/2023)   Received from [facility]   Hunger Vital Sign    Within the past 12 months, you worried that your food would run out before you got the money to buy more.: Never true    Within the past 12 months, the food you bought just didn't last and you didn't have money to get more.: Never true  Transportation Needs: No Transportation Needs (10/05/2023)   Received from [facility] - Transportation    In the past 12 months, has lack of transportation kept you from medical appointments or from getting medications?: No    Lack of Transportation (Non-Medical): No  Physical Activity: Inactive (10/10/2021)   Exercise Vital Sign    Days of Exercise per Week: 0 days    Minutes of Exercise per Session: 0 min  Stress: Stress Concern Present (10/10/2021)   [employer] of Occupational Health - Occupational Stress Questionnaire    Feeling of Stress : To some extent  Social Connections: Socially Isolated (10/10/2021)   Social Connection and Isolation Panel    Frequency of Communication with Friends and Family: Once a week    Frequency of Social Gatherings with Friends and Family: Never    Attends Religious Services: Never    Database administrator or Organizations: No    Attends Engineer, structural: Never    Marital Status: Married  Catering manager Violence: Not on file   Family Status  Relation Name Status   Mother  Deceased   Father  Deceased   Sister  Deceased   Mat Uncle x2 Deceased   MGM  Deceased   Other  (Not Specified)   Other  (Not Specified)  No partnership data on file   Family History  Problem Relation Age of Onset   Lung cancer Mother    Dementia Mother    Parkinson's  disease Father    Cancer Father        unk type   Diabetes Sister    Heart attack Sister  Breast cancer Sister    Cancer Maternal Uncle        unk types   Dementia Maternal Grandmother    Cancer Maternal Grandmother        unk type   Cancer Other    Dementia Other    Allergies  Allergen Reactions   Tylenol  [Acetaminophen ]     Per pt due to cirrhosis of the liver   Aleve [Naproxen] Rash      Review of Systems  All other systems reviewed and are negative.     Objective:     BP 110/72 (Cuff Size: Large)   Pulse 81   Temp 98 F (36.7 C) (Oral)   Resp 15   Ht 5' 4 (1.626 m)   Wt 167 lb (75.8 kg)   LMP  (LMP Unknown) Comment: Patient is not a good historian  SpO2 97%   BMI 28.67 kg/m  BP Readings from Last 3 Encounters:  11/20/23 110/72  11/11/23 134/82  11/02/23 (!) 155/86   Wt Readings from Last 3 Encounters:  11/20/23 167 lb (75.8 kg)  11/02/23 165 lb 12.8 oz (75.2 kg)  07/23/23 175 lb 6.4 oz (79.6 kg)      Physical Exam Constitutional:      Appearance: Normal appearance.  HENT:     Head: Normocephalic and atraumatic.  Eyes:     Conjunctiva/sclera: Conjunctivae normal.  Cardiovascular:     Rate and Rhythm: Normal rate and regular rhythm.     Pulses:          Dorsalis pedis pulses are 2+ on the right side and 2+ on the left side.  Pulmonary:     Effort: Pulmonary effort is normal.     Breath sounds: Normal breath sounds.  Musculoskeletal:     Right foot: Normal range of motion. No deformity, bunion, Charcot foot, foot drop or prominent metatarsal heads.     Left foot: Normal range of motion. No deformity, bunion, Charcot foot, foot drop or prominent metatarsal heads.  Feet:     Right foot:     Protective Sensation: 6 sites tested.  6 sites sensed.     Skin integrity: Dry skin present.     Toenail Condition: Right toenails are normal.     Left foot:     Protective Sensation: 6 sites tested.  6 sites sensed.     Skin integrity: Dry skin  present.     Toenail Condition: Left toenails are normal.  Skin:    General: Skin is warm and dry.  Neurological:     General: No focal deficit present.     Mental Status: She is alert. Mental status is at baseline.  Psychiatric:        Mood and Affect: Mood normal.        Behavior: Behavior normal.      No results found for any visits on 11/20/23.   Last CBC Lab Results  Component Value Date   WBC 3.4 (L) 11/11/2023   HGB 10.6 (L) 11/11/2023   HCT 32.4 (L) 11/11/2023   MCV 85.7 11/11/2023   MCH 28.0 11/11/2023   RDW 15.4 11/11/2023   PLT 80 (L) 11/11/2023   Last metabolic panel Lab Results  Component Value Date   GLUCOSE 294 (H) 11/11/2023   NA 135 11/11/2023   K 3.7 11/11/2023   CL 103 11/11/2023   CO2 23 11/11/2023   BUN 23 11/11/2023   CREATININE 0.97 11/11/2023   GFRNONAA >60 11/11/2023  CALCIUM  8.7 (L) 11/11/2023   PHOS 2.1 (L) 03/26/2015   PROT 6.8 11/11/2023   ALBUMIN 3.6 11/11/2023   BILITOT 0.6 11/11/2023   ALKPHOS 89 11/11/2023   AST 21 11/11/2023   ALT 12 11/11/2023   ANIONGAP 9 11/11/2023   Last lipids Lab Results  Component Value Date   CHOL 177 04/23/2023   HDL 62 04/23/2023   LDLCALC 90 04/23/2023   TRIG 148 04/23/2023   CHOLHDL 2.9 04/23/2023   Last hemoglobin A1c Lab Results  Component Value Date   HGBA1C 6.6 (A) 07/23/2023   Last thyroid functions Lab Results  Component Value Date   TSH 0.767 03/25/2015   Last vitamin D No results found for: 25OHVITD2, 25OHVITD3, VD25OH Last vitamin B12 and Folate Lab Results  Component Value Date   VITAMINB12 361 08/14/2022   FOLATE 22.0 05/05/2022      The 10-year ASCVD risk score (Arnett DK, et al., 2019) is: 5%    Assessment & Plan:   Assessment & Plan Type 2 diabetes mellitus Type 2 diabetes mellitus with increased blood glucose levels. Recent A1c of 7.8 indicates worsening control. Current metformin  regimen may be insufficient. Blood sugar 269.  - Add Rybelsus  3 mg  once daily to metformin  500 mg BID. - Provide 30-day Rybelsus  sample and send prescription to pharmacy. - Instruct to take Rybelsus  with food to minimize nausea and acid reflux. - Advise to monitor insurance coverage of Rybelsus  and contact if issues arise. - Counsel on dietary modifications to reduce sugar intake. - Schedule follow-up in 3 months to recheck A1c and assess medication efficacy.  Gastroesophageal reflux disease (GERD) GERD with acid reflux and burning sensation, possibly exacerbated by Rybelsus . - Refill Pepcid  prescription. - Advise to take Pepcid  daily. - Counsel on avoiding foods that worsen acid reflux.  Hyperlipidemia Refill statin.   Anxiety/MDD Stable, refill Lexapro .   - POCT HgB A1C - POCT Glucose (CBG) - metFORMIN  (GLUCOPHAGE ) 500 MG tablet; Take 1 tablet (500 mg total) by mouth 2 (two) times daily with a meal.  Dispense: 180 tablet; Refill: 1 - Semaglutide  (RYBELSUS ) 3 MG TABS; Take 1 tablet (3 mg total) by mouth daily.  Dispense: 90 tablet; Refill: 1 - famotidine  (PEPCID ) 40 MG tablet; Take 1 tablet (40 mg total) by mouth daily.  Dispense: 90 tablet; Refill: 1 - rosuvastatin  (CRESTOR ) 5 MG tablet; Take 1 tablet (5 mg total) by mouth daily.  Dispense: 90 tablet; Refill: 1 - escitalopram  (LEXAPRO ) 20 MG tablet; Take 1 tablet (20 mg total) by mouth daily.  Dispense: 90 tablet; Refill: 1   Return in about 3 months (around 02/20/2024).    Sharyle Fischer, DO

## 2023-11-23 ENCOUNTER — Other Ambulatory Visit (HOSPITAL_COMMUNITY): Payer: Self-pay

## 2023-11-23 ENCOUNTER — Other Ambulatory Visit: Payer: Self-pay

## 2023-11-23 MED ORDER — CAPIVASERTIB 160 MG PO TBPK
320.0000 mg | ORAL_TABLET | Freq: Two times a day (BID) | ORAL | 0 refills | Status: DC
  Filled 2023-11-23: qty 64, 28d supply, fill #0

## 2023-11-23 NOTE — Telephone Encounter (Signed)
 Pharmacy Patient Advocate Encounter  Received notification from [facility] that Prior Authorization for Rybelsus  3MG  tablets  has been APPROVED from 11/21/23 to 11/20/24. Ran test claim, Copay is $0.00. This test claim was processed through [facility]- copay amounts may vary at other pharmacies due to pharmacy/plan contracts, or as the patient moves through the different stages of their insurance plan.   PA #/Case ID/Reference #: [id number]

## 2023-11-23 NOTE — Progress Notes (Signed)
(  8.25) Left patient with new delivery date of 8/27. Medication will be filled on 8/26.  Refill approval came through after 4 pm.  Also left spec number should patient have any questions or concerns.

## 2023-11-26 ENCOUNTER — Inpatient Hospital Stay: Payer: MEDICAID

## 2023-11-26 DIAGNOSIS — C50912 Malignant neoplasm of unspecified site of left female breast: Secondary | ICD-10-CM

## 2023-11-26 DIAGNOSIS — Z5111 Encounter for antineoplastic chemotherapy: Secondary | ICD-10-CM | POA: Diagnosis not present

## 2023-11-26 LAB — BASIC METABOLIC PANEL - CANCER CENTER ONLY
Anion gap: 8 (ref 5–15)
BUN: 22 mg/dL (ref 8–23)
CO2: 22 mmol/L (ref 22–32)
Calcium: 8.6 mg/dL — ABNORMAL LOW (ref 8.9–10.3)
Chloride: 104 mmol/L (ref 98–111)
Creatinine: 1.03 mg/dL — ABNORMAL HIGH (ref 0.44–1.00)
GFR, Estimated: >60 mL/min (ref >60)
Glucose, Bld: 209 mg/dL — ABNORMAL HIGH (ref 70–99)
Potassium: 3.6 mmol/L (ref 3.5–5.1)
Sodium: 134 mmol/L — ABNORMAL LOW (ref 135–145)

## 2023-11-26 MED ORDER — FULVESTRANT 250 MG/5ML IM SOSY
500.0000 mg | PREFILLED_SYRINGE | Freq: Once | INTRAMUSCULAR | Status: AC
  Administered 2023-11-26: 500 mg via INTRAMUSCULAR
  Filled 2023-11-26: qty 10

## 2023-12-02 NOTE — Telephone Encounter (Unsigned)
 Copied from CRM #[id number]. Topic: General - Other >> Dec 02, 2023  9:52 AM Martinique E wrote: Reason for CRM: Laneta from [employer] called stating that they faxed over a request for OV notes on 8/11, notes have to be within the past 6 months that includes incontinence diagnosis. Fax number for [employer] is [phone].

## 2023-12-04 NOTE — Telephone Encounter (Signed)
 Please schedule, can be a virtual

## 2023-12-04 NOTE — Telephone Encounter (Signed)
 Will have to do appointment with patient to have documentation for incontinence issues

## 2023-12-07 ENCOUNTER — Other Ambulatory Visit: Payer: Self-pay | Admitting: Internal Medicine

## 2023-12-07 DIAGNOSIS — F419 Anxiety disorder, unspecified: Secondary | ICD-10-CM

## 2023-12-08 NOTE — Telephone Encounter (Signed)
 Requested Prescriptions  Pending Prescriptions Disp Refills   hydrOXYzine  (ATARAX ) 10 MG tablet [Pharmacy Med Name: hydrOXYzine  HCl 10 MG Oral Tablet] 180 tablet 0    Sig: TAKE 1 TABLET BY MOUTH TWICE DAILY AS NEEDED FOR ITCHING OR ANXIETY     Ear, Nose, and Throat:  Antihistamines 2 Failed - 12/08/2023  4:24 PM      Failed - Cr in normal range and within 360 days    Creatinine  Date Value Ref Range Status  11/26/2023 1.03 (H) 0.44 - 1.00 mg/dL Final   Creat  Date Value Ref Range Status  04/23/2023 0.74 0.50 - 1.05 mg/dL Final   Creatinine, Urine  Date Value Ref Range Status  04/23/2023 176 20 - 275 mg/dL Final         Passed - Valid encounter within last 12 months    Recent Outpatient Visits           2 weeks ago Type 2 diabetes mellitus with other specified complication, without long-term current use of insulin  [facility])   [facility] Bernardo Fend, DO   4 months ago Cystitis   [facility] Bernardo Fend, DO       Future Appointments             In 2 months Bernardo Fend, DO [facility] [place]

## 2023-12-09 ENCOUNTER — Telehealth: Payer: Self-pay

## 2023-12-09 NOTE — Telephone Encounter (Signed)
 Copied from CRM [id number]. Topic: Clinical - Medication Question >> Dec 08, 2023  5:13 PM DeAngela L wrote: Reason for CRM: patient calling to ask if Dr Bernardo sent over a med refill for hydrOXYzine  (ATARAX ) 10 MG tablet To the [facility] pharm for the patient  Informed patient the refill written today   Pt num [phone] (M)

## 2023-12-10 ENCOUNTER — Ambulatory Visit (INDEPENDENT_AMBULATORY_CARE_PROVIDER_SITE_OTHER): Payer: MEDICAID | Admitting: Internal Medicine

## 2023-12-10 ENCOUNTER — Inpatient Hospital Stay (HOSPITAL_BASED_OUTPATIENT_CLINIC_OR_DEPARTMENT_OTHER): Payer: MEDICAID | Admitting: Oncology

## 2023-12-10 ENCOUNTER — Encounter: Payer: Self-pay | Admitting: Oncology

## 2023-12-10 ENCOUNTER — Encounter: Payer: Self-pay | Admitting: Internal Medicine

## 2023-12-10 ENCOUNTER — Inpatient Hospital Stay: Payer: MEDICAID | Attending: Radiation Oncology

## 2023-12-10 ENCOUNTER — Other Ambulatory Visit: Payer: Self-pay

## 2023-12-10 VITALS — BP 109/69 | HR 79 | Temp 98.6°F | Resp 20 | Wt 163.1 lb

## 2023-12-10 VITALS — BP 130/80 | HR 81 | Temp 98.0°F | Resp 16 | Ht 64.0 in | Wt 163.1 lb

## 2023-12-10 DIAGNOSIS — Z170 Estrogen receptor positive status [ER+]: Secondary | ICD-10-CM | POA: Insufficient documentation

## 2023-12-10 DIAGNOSIS — Z9013 Acquired absence of bilateral breasts and nipples: Secondary | ICD-10-CM | POA: Insufficient documentation

## 2023-12-10 DIAGNOSIS — R197 Diarrhea, unspecified: Secondary | ICD-10-CM

## 2023-12-10 DIAGNOSIS — D696 Thrombocytopenia, unspecified: Secondary | ICD-10-CM

## 2023-12-10 DIAGNOSIS — C50511 Malignant neoplasm of lower-outer quadrant of right female breast: Secondary | ICD-10-CM | POA: Insufficient documentation

## 2023-12-10 DIAGNOSIS — R188 Other ascites: Secondary | ICD-10-CM

## 2023-12-10 DIAGNOSIS — R32 Unspecified urinary incontinence: Secondary | ICD-10-CM | POA: Diagnosis not present

## 2023-12-10 DIAGNOSIS — F322 Major depressive disorder, single episode, severe without psychotic features: Secondary | ICD-10-CM | POA: Diagnosis not present

## 2023-12-10 DIAGNOSIS — K7460 Unspecified cirrhosis of liver: Secondary | ICD-10-CM | POA: Insufficient documentation

## 2023-12-10 DIAGNOSIS — G893 Neoplasm related pain (acute) (chronic): Secondary | ICD-10-CM | POA: Diagnosis not present

## 2023-12-10 DIAGNOSIS — Z5111 Encounter for antineoplastic chemotherapy: Secondary | ICD-10-CM | POA: Insufficient documentation

## 2023-12-10 DIAGNOSIS — C50912 Malignant neoplasm of unspecified site of left female breast: Secondary | ICD-10-CM | POA: Diagnosis not present

## 2023-12-10 DIAGNOSIS — F323 Major depressive disorder, single episode, severe with psychotic features: Secondary | ICD-10-CM

## 2023-12-10 DIAGNOSIS — C50812 Malignant neoplasm of overlapping sites of left female breast: Secondary | ICD-10-CM | POA: Insufficient documentation

## 2023-12-10 DIAGNOSIS — D72810 Lymphocytopenia: Secondary | ICD-10-CM | POA: Diagnosis not present

## 2023-12-10 DIAGNOSIS — D72819 Decreased white blood cell count, unspecified: Secondary | ICD-10-CM | POA: Insufficient documentation

## 2023-12-10 LAB — CMP (CANCER CENTER ONLY)
ALT: 11 U/L (ref 0–44)
AST: 23 U/L (ref 15–41)
Albumin: 3.6 g/dL (ref 3.5–5.0)
Alkaline Phosphatase: 74 U/L (ref 38–126)
Anion gap: 10 (ref 5–15)
BUN: 19 mg/dL (ref 8–23)
CO2: 21 mmol/L — ABNORMAL LOW (ref 22–32)
Calcium: 8.8 mg/dL — ABNORMAL LOW (ref 8.9–10.3)
Chloride: 107 mmol/L (ref 98–111)
Creatinine: 1.05 mg/dL — ABNORMAL HIGH (ref 0.44–1.00)
GFR, Estimated: >60 mL/min (ref >60)
Glucose, Bld: 188 mg/dL — ABNORMAL HIGH (ref 70–99)
Potassium: 3.2 mmol/L — ABNORMAL LOW (ref 3.5–5.1)
Sodium: 138 mmol/L (ref 135–145)
Total Bilirubin: 0.7 mg/dL (ref 0.0–1.2)
Total Protein: 6.4 g/dL — ABNORMAL LOW (ref 6.5–8.1)

## 2023-12-10 LAB — POCT URINALYSIS DIPSTICK
Bilirubin, UA: NEGATIVE
Glucose, UA: NEGATIVE
Ketones, UA: NEGATIVE
Nitrite, UA: NEGATIVE
Protein, UA: POSITIVE — AB
Spec Grav, UA: 1.02 (ref 1.010–1.025)
Urobilinogen, UA: 0.2 U/dL
pH, UA: 5 (ref 5.0–8.0)

## 2023-12-10 LAB — CBC WITH DIFFERENTIAL (CANCER CENTER ONLY)
Abs Immature Granulocytes: 0.01 K/uL (ref 0.00–0.07)
Basophils Absolute: 0 K/uL (ref 0.0–0.1)
Basophils Relative: 1 %
Eosinophils Absolute: 0.1 K/uL (ref 0.0–0.5)
Eosinophils Relative: 4 %
HCT: 29.3 % — ABNORMAL LOW (ref 36.0–46.0)
Hemoglobin: 9.6 g/dL — ABNORMAL LOW (ref 12.0–15.0)
Immature Granulocytes: 0 %
Lymphocytes Relative: 15 %
Lymphs Abs: 0.5 K/uL — ABNORMAL LOW (ref 0.7–4.0)
MCH: 27.8 pg (ref 26.0–34.0)
MCHC: 32.8 g/dL (ref 30.0–36.0)
MCV: 84.9 fL (ref 80.0–100.0)
Monocytes Absolute: 0.2 K/uL (ref 0.1–1.0)
Monocytes Relative: 6 %
Neutro Abs: 2.4 K/uL (ref 1.7–7.7)
Neutrophils Relative %: 74 %
Platelet Count: 70 K/uL — ABNORMAL LOW (ref 150–400)
RBC: 3.45 MIL/uL — ABNORMAL LOW (ref 3.87–5.11)
RDW: 15.5 % (ref 11.5–15.5)
WBC Count: 3.3 K/uL — ABNORMAL LOW (ref 4.0–10.5)
nRBC: 0 % (ref 0.0–0.2)

## 2023-12-10 MED ORDER — ZINC OXIDE 10 % EX CREA: 1.0000 | TOPICAL_CREAM | Freq: Every day | CUTANEOUS | 0 refills | Status: AC

## 2023-12-10 MED ORDER — POTASSIUM CHLORIDE CRYS ER 20 MEQ PO TBCR: 20.0000 meq | EXTENDED_RELEASE_TABLET | Freq: Every day | ORAL | 0 refills | Status: AC

## 2023-12-10 NOTE — Assessment & Plan Note (Signed)
 See above plan.

## 2023-12-10 NOTE — Assessment & Plan Note (Signed)
 Due to splenomegaly.  17cm

## 2023-12-10 NOTE — Assessment & Plan Note (Addendum)
 Bilateral breast invasive lobular carcinoma, s/p bilateral mastectomy. [name]   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 CT-guided right cervical lymphadenopathy pathology showed metastatic poorly differentiated carcinoma, with morphology consistent with breast primary.  NGS PIK3CA, CDH1, TMB 13.2 pMMR,.  ER +90%/PR - 0% /HER2- (0)  Recommend to continue monthly fulvestrant  injection  She tolerates Capivasertib  320mg  BID D1-4 every 7 days.  Continue current regimen.  Repeat CT scan to evaluate treatment response.

## 2023-12-10 NOTE — Assessment & Plan Note (Signed)
 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  Drug induced leukopenia vs other etiologies.  Continue observation.

## 2023-12-10 NOTE — Progress Notes (Signed)
 Acute Office Visit  Subjective:     Patient ID: Sharon Woods, female    DOB: [birth year], 62 y.o.   MRN: [id number]  Chief Complaint  Patient presents with   Urinary Incontinence    HPI Patient is in today for forms for urinary incontinence.   Discussed the use of AI scribe software for clinical note transcription with the patient, who gave verbal consent to proceed.  History of Present Illness Sharon Woods is a 63 year old female who presents with urinary and stool incontinence. Patient is a poor historian.   She experiences urinary incontinence two to three times daily, often unable to reach the bathroom in time, especially when walking or rushing home. She senses when her bladder is full but still has episodes of incontinence. She does not experience incontinence with laughing or sneezing and uses long pads for protection, which are costly.  Stool incontinence causes distress and skin irritation with a burning sensation.   She has a history of urinary tract infections, with urine color varying from light yellow to black. A test in July showed no infection, but she has had infections in the past. She experiences a burning sensation during urination and notes her urine sometimes appears as dark as black.  Back pain, particularly in the kidney area, is constant and makes it difficult to lay back. She also experiences a sensation of warmth inside her body that sometimes turns hot externally.   Review of Systems  Genitourinary:  Positive for dysuria, flank pain, frequency and urgency. Negative for hematuria.        Objective:    BP 130/80 (Cuff Size: Large)   Pulse 81   Temp 98 F (36.7 C) (Oral)   Resp 16   Ht 5' 4 (1.626 m)   Wt 163 lb 1.6 oz (74 kg)   LMP  (LMP Unknown) Comment: Patient is not a good historian  SpO2 99%   BMI 28.00 kg/m    Physical Exam Constitutional:      Appearance: Normal appearance.  HENT:     Head: Normocephalic and  atraumatic.  Eyes:     Conjunctiva/sclera: Conjunctivae normal.  Cardiovascular:     Rate and Rhythm: Normal rate and regular rhythm.  Pulmonary:     Effort: Pulmonary effort is normal.     Breath sounds: Normal breath sounds.  Abdominal:     Tenderness: There is right CVA tenderness. There is no left CVA tenderness.  Skin:    General: Skin is warm and dry.  Neurological:     General: No focal deficit present.     Mental Status: She is alert. Mental status is at baseline.  Psychiatric:        Mood and Affect: Mood normal.        Behavior: Behavior normal.     No results found for any visits on 12/10/23.      Assessment & Plan:   Assessment & Plan Urge incontinence with associated skin irritation Urge incontinence with sudden urge and inability to reach bathroom, causing skin irritation. No stress incontinence. Previous UTIs but no current infection.  - Perform urine test for infection, which was positive for trace leukocytes but negative for nitrates, will send for culture.  - Prescribe zinc  oxide cream for skin irritation, advise barrier cream like Desitin. - Instruct to change incontinence pads immediately after wetting. - Consider urology referral if further investigation needed. - Document incontinence for incontinence supplies.     -  POCT Urinalysis Dipstick - ZINC  OXIDE, TOPICAL, 10 % CREA; Apply 1 each topically daily.  Dispense: 78 g; Refill: 0 - Urine Culture   Return for already scheduled.  Sharyle Fischer, DO

## 2023-12-10 NOTE — Assessment & Plan Note (Signed)
 Tramadol 50mg  Q6h PRN

## 2023-12-10 NOTE — Assessment & Plan Note (Signed)
 [name]

## 2023-12-10 NOTE — Progress Notes (Signed)
 Hematology/Oncology Progress note [phone] [phone]       CHIEF COMPLAINTS/REASON FOR VISIT:  bilateral breast cancer, thrombocytopenia, leukopenia  ASSESSMENT & PLAN:   Cancer Staging  Malignant neoplasm of left breast, stage 4 (HCC) Staging form: Breast, AJCC 8th Edition - Pathologic stage from 11/25/2021: Stage IB (pT2, pN1, cM0, G2, ER+, PR+, HER2-) - Signed by Babara Call, MD on 11/25/2021 - Pathologic stage from 07/25/2022: Stage IV (rpTX, rpNX, rpM1, ER+, PR: Not Assessed, HER2-) - Signed by Babara Call, MD on 08/14/2022  Malignant neoplasm of lower-outer quadrant of right breast of female, estrogen receptor positive (HCC) Staging form: Breast, AJCC 8th Edition - Pathologic stage from 11/25/2021: Stage IIIA (pT1c, pN3a, cM0, G2, ER+, PR+, HER2-) - Signed by Babara Call, MD on 11/25/2021   Malignant neoplasm of left breast, 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 [name]   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 CT-guided right cervical lymphadenopathy pathology showed metastatic poorly differentiated carcinoma, with morphology consistent with  breast primary.  NGS PIK3CA, CDH1, TMB 13.2 pMMR,.  ER +90%/PR - 0% /HER2- (0)  Recommend to continue monthly fulvestrant  injection  She tolerates Capivasertib  320mg  BID D1-4 every 7 days.  Continue current regimen.  Repeat CT scan to evaluate treatment response.     Malignant neoplasm of lower-outer quadrant of right breast of female, estrogen receptor positive (HCC) See above plan.  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  Drug induced leukopenia vs other etiologies.  Continue observation.   Neoplasm related pain Tramadol  50mg  Q6h PRN  Severe major depression, single episode, with psychotic features, mood-congruent (HCC) [name]    Thrombocytopenia (HCC) Due to splenomegaly.  17cm     Diarrhea She developed diarrhea before state of Capivasertib  Discussed about Imodium PRN instructions.   Ascites Improved distention  Liver cirrhosis (HCC) Follow up with GI Uptodate for EGD varices screening.    Plan was discussed with patient and her husband  Orders Placed This Encounter  Procedures   CT CHEST ABDOMEN PELVIS W CONTRAST    Standing Status:   Future    Expected Date:   01/15/2024    Expiration Date:   12/09/2024    If indicated for the ordered procedure, I authorize the administration of contrast media per Radiology protocol:   Yes    Does the patient have a contrast media/X-ray dye allergy?:   Yes    Preferred imaging location?:   [facility]    If indicated for the ordered procedure, I authorize the administration of oral contrast media per Radiology protocol:   Yes   CBC with Differential (Cancer Center Only)    Standing Status:   Future    Expected Date:  01/22/2024    Expiration Date:   04/21/2024   CMP (Cancer Center only)    Standing Status:   Future    Expected Date:   01/22/2024     Expiration Date:   04/21/2024   Cancer antigen 27.29    Standing Status:   Future    Expected Date:   01/22/2024    Expiration Date:   04/21/2024   Cancer antigen 15-3    Standing Status:   Future    Expected Date:   01/22/2024    Expiration Date:   04/21/2024   Follow up per LOS [name], questions or concerns.  [name], MD, PhD [facility] Hematology Oncology 12/10/2023     HISTORY OF PRESENTING ILLNESS:   is a  63 y.o.  female presents for follow up of bilateral breast cancer, thrombocytopenia and leukopenia.  Oncology History  Malignant neoplasm of left breast, stage 4 (HCC)  08/06/2021 Mammogram   08/06/2021, digital bilateral mammogram and ultrasound showed left breast 3:00 mass, 1.7 x 1.7 x 1.7 cm, ultrasound of the left axillary is negative. 08/21/2021 right breast 1 x 0.7 x 0.8 cm angulated spiculated mass at the right breast 7:00, 5 cm from nipple.  Ultrasound of the right axillary demonstrates 3 abnormal thickened cortex lymph node. 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 Lael Hines right breast 7:00, invasive mammary carcinoma with lobular features, in situ carcinoma,  ER+, PR+, HER 2-This was found to be concordant by Dr. Lael Hines. right axilla lymph node +, extracapsular extension.  -This was found to be concordant by Dr. Lael Hines   08/28/2021 Initial Diagnosis   Malignant neoplasm of upper-outer quadrant of left breast in female, estrogen receptor positive (HCC)   08/28/2021 Imaging   CT CHEST, ABDOMEN, AND PELVIS WITH CONTRAST Asymmetric mildly enlarged right axillary nodes. Correlate with biopsy. There are some punctate foci of sclerosis in the included spine that could reflect subtle metastatic disease.  Nonobstructing bilateral renal calculi.   08/29/2021 Imaging   BILATERAL BREAST MRI WITH AND WITHOUT CONTRAST 1. 2.5 centimeter enhancing mass in the LOWER OUTER QUADRANT of the RIGHT breast, correlating with known malignancy. 2. At least 4 enlarged RIGHT axillary lymph nodes, correlating well with recently biopsied lymph node showing metastatic disease. 3. 3.4 centimeter enhancing mass in the LEFT breast, with an additional 2.9 centimeters of non mass enhancement posterior to the mass also suspicious for malignancy. This likely correlates with the area calcifications seen mammographically. 4. 5 millimeters satellite nodule along the LATERAL aspect of known malignancy in the LOWER OUTER QUADRANT of the LEFT breast. 5. LEFT axilla is negative for adenopathy.       09/05/2021 Imaging   NUCLEAR MEDICINE WHOLE BODY BONE SCAN Uptake at L2 which is nonspecific but may be related to advanced degenerative disc and facet disease changes at both L1-L2 and L2-L3; no evidence of osseous metastatic disease by CT. No definite osseous metastatic lesions identified.    Genetic Testing   Negative genetic testing. No pathogenic variants identified on the [facility] CancerNext-Expanded+RNA panel. The report date is 10/03/2021.  The CancerNext-Expanded + RNAinsight gene panel offered by [employer] and includes sequencing and rearrangement analysis for the following 77 genes: IP, ALK, APC*, ATM*, AXIN2, BAP1, BARD1, BLM,  BMPR1A, BRCA1*, BRCA2*, BRIP1*, CDC73, CDH1*,CDK4, CDKN1B, CDKN2A, CHEK2*, CTNNA1, DICER1, FANCC, FH, FLCN, GALNT12, KIF1B, LZTR1, MAX, MEN1, MET, MLH1*, MSH2*, MSH3, MSH6*, MUTYH*, NBN, NF1*, NF2, NTHL1, PALB2*, PHOX2B, PMS2*, POT1, PRKAR1A, PTCH1, PTEN*, RAD51C*, RAD51D*,RB1, RECQL, RET, SDHA, SDHAF2, SDHB, SDHC, SDHD, SMAD4, SMARCA4, SMARCB1, SMARCE1, STK11, SUFU, TMEM127, TP53*,TSC1, TSC2, VHL and XRCC2 (sequencing and deletion/duplication); EGFR, EGLN1, HOXB13, KIT, MITF, PDGFRA, POLD1 and POLE  (sequencing only); EPCAM and GREM1 (deletion/duplication only).   11/15/2021 Surgery   She underwent left simple mastectomy and right modified mastectomy.   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  pT2 pN1a, ER +90%, PR +90%, HER2 negative (1+)  # Right Breast invasive lobular carcinoma Grade 2, negative margins, right axillary lymph nodes  30/32 involved with metastatic carcinoma, pT1c pN3a ER +90%, PR +51-90%, HER2 negative (1+)    11/25/2021 Cancer Staging   Staging form: Breast, AJCC 8th Edition - Pathologic stage from 11/25/2021: Stage IB (pT2, pN1, cM0, G2, ER+, PR+, HER2-) - Signed by Babara Call, MD on 11/25/2021 Stage prefix: Initial diagnosis Multigene prognostic tests performed: None Histologic grading system: 3 grade system   01/09/2022 - 03/10/2022 Radiation Therapy   Adjuvant breast Radiation.    07/25/2022 Cancer Staging   Staging form: Breast, AJCC 8th Edition - Pathologic stage from 07/25/2022: Stage IV (rpTX, pNX, pM1, ER+, PR: Not Assessed, HER2-) - Signed by Babara Call, MD on 08/14/2022 Stage prefix: Recurrence Multigene prognostic tests performed: None   07/25/2022 Procedure   Colonoscopy showed Findings - Six 5 to 6 mm polyps in the ascending colon, removed with a cold snare. Resected and retrieved. - One 10 mm polyp in the descending colon, removed with a cold snare. Resected and retrieved. Clip was placed. - One 30 mm polyp in the rectum, removed with a hot snare. Resected and retrieved. Clips were placed.  Pathology showed A.  COLON POLYP X 6, ASCENDING AND CECUM; COLD SNARE: - COLONIC MUCOSA WITH METASTATIC LOBULAR BREAST CARCINOMA (3) - HYPERPLASTIC POLYP (3)  B.  COLON POLYP, DESCENDING; COLD SNARE: - TUBULAR ADENOMA. - NEGATIVE FOR HIGH-GRADE  DYSPLASIA AND MALIGNANCY.  C.  RECTUM POLYP; HOT SNARE: - TUBULOVILLOUS ADENOMA. - NEGATIVE FOR HIGH-GRADE DYSPLASIA AND MALIGNANCY.  Comment: The metastatic lobular breast carcinoma is positive for GATA-3 and ER (strong staining in greater than 90% of the tumor cells). HER2 negative (1+)     08/06/2022 Imaging   PET  Status post bilateral mastectomy with radiation changes in the anterior upper lobes.   No findings suspicious for recurrent or metastatic disease   09/18/2022 Imaging   MRI brain w wo  1. No evidence of intracranial metastatic disease. 2. Mild multifocal T2 FLAIR hyperintense signal abnormality within the cerebral white matter, nonspecific but most often secondary to chronic small vessel ischemia. 3. Paranasal sinus disease as described.   01/27/2023 Imaging   US  left axillary No sonographic evidence of malignancy at the site of palpable concern in the LEFT axilla.    04/22/2023 Imaging   CT chest abdomen pelvis w contrast showed 1. New right chest wall soft tissue lesion which may represent locally recurrent or metastatic disease. Attention on follow-up. 2. New right seventh rib sclerotic osseous lesion. 3. Splenomegaly.    05/11/2023 Procedure   CT guided biopsy of right axillary nodule   BENIGN FIBROVASCULAR TISSUE.  NO LYMPHOID TISSUE IDENTIFIED.       NEGATIVE FOR MALIGNANCY.    08/19/2023 Imaging   PET scan showed 1. 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 2. Small but hypermetabolic right level IIA, right level IIb, right level V, and bilateral inguinal lymph nodes, suspicious for metastatic disease. 3. Progressive perihepatic ascites, splenomegaly, and findings of portal venous hypertension including upstream varices adjacent to the distal esophagus and collateral vessels/varices in the gastrohepatic ligament. Progressive edema along the gastrohepatic ligament,  peripancreatic region, and retroperitoneum. 4. Nonobstructive bilateral nephrolithiasis. 5.  Aortic Atherosclerosis (ICD10-I70.0).     09/11/2023 Procedure   1. Lymph node, needle/core biopsy, right neck :      -  METASTATIC POORLY DIFFERENTIATED CARCINOMA MORPHOLOGIC CONSISTENT WITH THE      CLINICAL HISTORY OF BREAST PRIMARY (HISTORY OF INVASIVE LOBULAR CARCINOMA OF      BILATERAL BREAST).      NOTE: CLINICAL HISTORY OF STAGE IV BREAST CARCINOMA (INVASIVE LOBULAR OF BOTH      LEFT AND RIGHT) IS NOTED.  THE FINDINGS ARE MORPHOLOGICALLY CONSISTENT WITH      METASTATIC BREAST CARCINOMA.  NOTE CONFIRMATORY IMMUNOHISTOCHEMICAL STAINS ARE PERFORMED GIVEN THE MORPHOLOGIC IMPRESSION AND TO PRESERVE TISSUE FOR CLINICIAN  DIRECTED TESTING.    10/08/2023 -  Chemotherapy   start Capivasertib     Malignant neoplasm of lower-outer quadrant of right breast of female, estrogen receptor positive (HCC)  08/06/2021 Mammogram   08/06/2021, digital bilateral mammogram and ultrasound showed left breast 3:00 mass, 1.7 x 1.7 x 1.7 cm, ultrasound of the left axillary is negative. 08/21/2021 right breast 1 x 0.7 x 0.8 cm angulated spiculated mass at the right breast 7:00, 5 cm from nipple.  Ultrasound of the right axillary demonstrates 3 abnormal thickened cortex lymph node. 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 Lael Hines right breast 7:00, invasive mammary carcinoma with lobular features, in situ carcinoma,  ER+, PR+, HER 2-This was found to be concordant by Dr. Lael Hines. right axilla lymph node +, extracapsular extension.  -This was found to be concordant by Dr. Lael Hines   08/28/2021 Initial Diagnosis   Malignant neoplasm of lower-outer quadrant of right breast of female, estrogen receptor positive (HCC)   08/28/2021 Imaging   CT  CHEST, ABDOMEN, AND PELVIS WITH CONTRAST Asymmetric mildly enlarged right axillary nodes. Correlate with biopsy. There are some punctate foci of sclerosis in the included spine that could reflect subtle metastatic disease. Nonobstructing bilateral renal calculi.   08/29/2021 Imaging   BILATERAL BREAST MRI WITH AND WITHOUT CONTRAST 1. 2.5 centimeter enhancing mass in the LOWER OUTER QUADRANT of the RIGHT breast, correlating with known malignancy. 2. At least 4 enlarged RIGHT axillary lymph nodes, correlating well with recently biopsied lymph node showing metastatic disease. 3. 3.4 centimeter enhancing mass in the LEFT breast, with an additional 2.9 centimeters of non mass enhancement posterior to the mass also suspicious for malignancy. This likely correlates with the area calcifications seen mammographically. 4. 5 millimeters satellite nodule along the LATERAL aspect of known malignancy in the LOWER OUTER QUADRANT of the LEFT breast. 5. LEFT axilla is negative for adenopathy.       09/05/2021 Imaging   NUCLEAR MEDICINE WHOLE BODY BONE SCAN Uptake at L2 which is nonspecific but may be related to advanced  degenerative disc and facet disease changes at both L1-L2 and L2-L3; no evidence of osseous metastatic disease by CT. No definite osseous metastatic lesions identified.   09/09/2021 Oncotype testing   [id number] block RIGHT 7:00 5 CM FN; ULTRASOUND-GUIDED BIOPSY Oncotype Dx RS score 22    Genetic Testing   Negative genetic testing. No pathogenic variants identified on the [facility] CancerNext-Expanded+RNA panel. The report date is 10/03/2021.  The CancerNext-Expanded + RNAinsight gene panel offered by [employer] and includes sequencing and rearrangement analysis for the following 77 genes: IP, ALK, APC*, ATM*, AXIN2, BAP1, BARD1, BLM, BMPR1A, BRCA1*, BRCA2*, BRIP1*, CDC73, CDH1*,CDK4, CDKN1B, CDKN2A, CHEK2*, CTNNA1, DICER1, FANCC, FH, FLCN, GALNT12, KIF1B, LZTR1, MAX, MEN1, MET, MLH1*,  MSH2*, MSH3, MSH6*, MUTYH*, NBN, NF1*, NF2, NTHL1, PALB2*, PHOX2B, PMS2*, POT1, PRKAR1A, PTCH1, PTEN*, RAD51C*, RAD51D*,RB1, RECQL, RET, SDHA, SDHAF2, SDHB, SDHC, SDHD, SMAD4, SMARCA4, SMARCB1, SMARCE1, STK11, SUFU, TMEM127, TP53*,TSC1, TSC2, VHL and XRCC2 (sequencing and deletion/duplication); EGFR, EGLN1, HOXB13, KIT, MITF, PDGFRA, POLD1 and POLE (sequencing only); EPCAM and GREM1 (deletion/duplication only).   11/15/2021 Surgery   She underwent left simple mastectomy and right modified mastectomy.   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  pT2 pN1a, ER +90%, PR +90%, HER2 negative (1+)  # Right Breast invasive lobular carcinoma Grade 2, negative margins, right axillary lymph nodes  30/32 involved with metastatic carcinoma, pT1c pN3a ER +90%, PR +51-90%, HER2 negative (1+)    11/25/2021 Cancer Staging   Staging form: Breast, AJCC 8th Edition - Pathologic stage from 11/25/2021: Stage IIIA (pT1c, pN3a, cM0, G2, ER+, PR+, HER2-) - Signed by Babara Call, MD on 11/25/2021 Stage prefix: Initial diagnosis Multigene prognostic tests performed: None Histologic grading system: 3 grade system   12/10/2021 Imaging   PET scan  1. Interval bilateral mastectomy and right axillary node dissection.No evidence of residual disease in the chest wall, nodal metastases or distant metastases. 2. Focal hypermetabolic activity in the proximal rectum corresponding with an intraluminal polypoid lesion, suspicious for a villous adenoma or early colon cancer. Sigmoidoscopy/colonoscopy recommended unless recently performed   12/10/2021 Imaging   1. Interval bilateral mastectomy and right axillary node dissection.No evidence of residual disease in the chest wall, nodal metastasesor distant metastases. 2. Focal  hypermetabolic activity in the proximal rectum corresponding with an intraluminal polypoid lesion, suspicious for a villous adenoma or early colon cancer. Sigmoidoscopy/colonoscopy recommended unless recently performed    She is a poor historian. History of major depression, psychosis, previously on olanzapine  and Remeron  not currently on any and if not currently following up with psychiatrist. Patient's family history is positive for sister with breast cancer   INTERVAL HISTORY Sharon Woods is a 63 y.o. female who has above history reviewed by me today presents for follow up visit for management of bilateral breast cancer She denies pain today  She is accompanied by her husband. + diarrhea, 2-3 times per day.  She takes Capivasertib , tolerates well.  S/p EGD in August 2025  She continues to have bedbug infestation at home.   Review of Systems  Constitutional:  Negative for appetite change, chills, fatigue and fever.  HENT:   Negative for hearing loss and voice change.   Eyes:  Negative for eye problems.  Respiratory:  Negative for chest tightness and cough.   Cardiovascular:  Negative for chest pain.  Gastrointestinal:  Positive for diarrhea.  Negative for abdominal distention, abdominal pain and blood in stool.  Endocrine: Negative for hot flashes.  Genitourinary:  Negative for difficulty urinating and frequency.   Musculoskeletal:  Negative for arthralgias.  Skin:  Negative for itching and rash.  Neurological:  Negative for extremity weakness.  Hematological:  Negative for adenopathy.  Psychiatric/Behavioral:  Negative for confusion.     MEDICAL HISTORY:  Past Medical History:  Diagnosis Date   Anxiety    Cancer (HCC)    Depression    Diabetes mellitus without complication (HCC)    History of high cholesterol 2000   Hyperlipidemia    Hypertension    Patient denies medical problems    Thrombocytopenia (HCC)     SURGICAL HISTORY: Past Surgical History:  Procedure  Laterality Date   ABDOMINAL HYSTERECTOMY     AXILLARY SENTINEL NODE BIOPSY Left 11/15/2021   Procedure: AXILLARY SENTINEL NODE BIOPSY;  Surgeon: Lane Shope, MD;  Location: [facility] ORS;  Service: General;  Laterality: Left;   BREAST BIOPSY Right 08/21/2021   us  bx 7:00 mass coil clip path pending   BREAST BIOPSY Right 08/21/2021   us  bx of LN, hydro marker, path pending   BREAST BIOPSY Left 08/21/2021   us  bx, heart marker, path pending   CESAREAN SECTION     x2   COLONOSCOPY WITH PROPOFOL  N/A 07/25/2022   Procedure: COLONOSCOPY WITH PROPOFOL ;  Surgeon: Therisa Bi, MD;  Location: [facility] ENDOSCOPY;  Service: Gastroenterology;  Laterality: N/A;   ESOPHAGOGASTRODUODENOSCOPY N/A 11/02/2023   Procedure: EGD (ESOPHAGOGASTRODUODENOSCOPY);  Surgeon: Therisa Bi, MD;  Location: [facility] ENDOSCOPY;  Service: Gastroenterology;  Laterality: N/A;   MASTECTOMY     MASTECTOMY MODIFIED RADICAL Right 11/15/2021   Procedure: MASTECTOMY MODIFIED RADICAL;  Surgeon: Lane Shope, MD;  Location: [facility] ORS;  Service: General;  Laterality: Right;   NO PAST SURGERIES     TOTAL MASTECTOMY Left 11/15/2021   Procedure: TOTAL MASTECTOMY;  Surgeon: Lane Shope, MD;  Location: [facility] ORS;  Service: General;  Laterality: Left;    SOCIAL HISTORY: Social History   Socioeconomic History   Marital status: Married    Spouse name: Not on file   Number of children: Not on file   Years of education: Not on file   Highest education level: Not on file  Occupational History   Not on file  Tobacco Use   Smoking status: Former    Current packs/day: 0.00    Types: Cigarettes    Quit date: 2013    Years since quitting: 12.7    Passive exposure: Current   Smokeless tobacco: Never  Vaping Use   Vaping status: Never Used  Substance and Sexual Activity   Alcohol use: Not Currently    Comment: occasionally   Drug use: No   Sexual activity: Not Currently    Comment: unable to assess   Other Topics Concern   Not on  file  Social History Narrative   Not on file   Social Drivers of Health   Financial Resource Strain: Medium Risk (10/05/2023)   [facility]   Overall Financial Resource Strain (CARDIA)    Difficulty of Paying Living Expenses: Somewhat hard  Food Insecurity: No Food Insecurity (10/05/2023)   Received from [facility]   Hunger Vital Sign    Within the past 12 months, you worried that your food would run out before you got the money to buy more.: Never true    Within the past 12 months, the food  you bought just didn't last and you didn't have money to get more.: Never true  Transportation Needs: No Transportation Needs (10/05/2023)   [facility]    In the past 12 months, has lack of transportation kept you from medical appointments or from getting medications?: No    Lack of Transportation (Non-Medical): No  Physical Activity: Inactive (10/10/2021)   Exercise Vital Sign    Days of Exercise per Week: 0 days    Minutes of Exercise per Session: 0 min  Stress: Stress Concern Present (10/10/2021)   [employer] of Occupational Health - Occupational Stress Questionnaire    Feeling of Stress : To some extent  Social Connections: Socially Isolated (10/10/2021)   Social Connection and Isolation Panel    Frequency of Communication with Friends and Family: Once a week    Frequency of Social Gatherings with Friends and Family: Never    Attends Religious Services: Never    Diplomatic Services operational officer: No    Attends Engineer, structural: Never    Marital Status: Married  Catering manager Violence: Not on file    FAMILY HISTORY: Family History  Problem Relation Age of Onset   Lung cancer Mother    Dementia Mother    Parkinson's disease Father    Cancer Father        unk type   Diabetes Sister    Heart attack Sister    Breast cancer Sister    Cancer Maternal  Uncle        unk types   Dementia Maternal Grandmother    Cancer Maternal Grandmother        unk type   Cancer Other    Dementia Other     ALLERGIES:  is allergic to tylenol  [acetaminophen ] and aleve [naproxen].  MEDICATIONS:  Current Outpatient Medications  Medication Sig Dispense Refill   ACCU-CHEK GUIDE test strip USE TO CHECK BLOOD SUGAR UP TO 4 TIMES DAILY AS DIRECTED 100 each 0   Accu-Chek Softclix Lancets lancets USE TO CHECK BLOOD SUAGR UP TO 4 TIMES DAILY AS DIRECTED 100 each 0   anastrozole  (ARIMIDEX ) 1 MG tablet Take 1 tablet by mouth once daily 90 tablet 0   blood glucose meter kit and supplies KIT Dispense based on patient and insurance preference. Use up to four times daily as directed. 1 each 1   calcium  carbonate (OS-CAL) 600 MG TABS tablet Take 2 tablets (1,200 mg total) by mouth daily. 60 tablet 5   capivasertib  (TRUQAP ) 160 MG pack Take 2 tablets (320 mg total) by mouth 2 (two) times daily. Take for 4 days, then hold for 3 days. Repeat every 7 days. 64 tablet 0   cyanocobalamin  (VITAMIN B12) 1000 MCG tablet Take 1 tablet (1,000 mcg total) by mouth daily. 30 tablet 2   escitalopram  (LEXAPRO ) 20 MG tablet Take 1 tablet (20 mg total) by mouth daily. 90 tablet 1   famotidine  (PEPCID ) 40 MG tablet Take 1 tablet (40 mg total) by mouth daily. 90 tablet 1   hydrOXYzine  (ATARAX ) 10 MG tablet TAKE 1 TABLET BY MOUTH TWICE DAILY AS NEEDED FOR ITCHING OR ANXIETY 180 tablet 0   Iron -Vitamin C  65-125 MG TABS Take 1 tablet by mouth daily. 30 tablet 2   metFORMIN  (GLUCOPHAGE ) 500 MG tablet Take 1 tablet (500 mg total) by mouth 2 (two) times daily with a meal. 180 tablet 1   ondansetron  (ZOFRAN ) 8 MG tablet Take  1 tablet (8 mg total) by mouth every 8 (eight) hours as needed for nausea or vomiting. 20 tablet 2   rosuvastatin  (CRESTOR ) 5 MG tablet Take 1 tablet (5 mg total) by mouth daily. 90 tablet 1   Semaglutide  (RYBELSUS ) 3 MG TABS Take 1 tablet (3 mg total) by mouth daily. 90 tablet 1    traMADol  (ULTRAM ) 50 MG tablet Take 1 tablet (50 mg total) by mouth every 6 (six) hours as needed. 30 tablet 0   triamcinolone  ointment (KENALOG ) 0.5 % Apply 1 Application topically 2 (two) times daily. 30 g 1   ZINC  OXIDE, TOPICAL, 10 % CREA Apply 1 each topically daily. 78 g 0   potassium chloride  SA (KLOR-CON  M) 20 MEQ tablet Take 1 tablet (20 mEq total) by mouth daily. 3 tablet 0   No current facility-administered medications for this visit.     PHYSICAL EXAMINATION: ECOG PERFORMANCE STATUS: 1 - Symptomatic but completely ambulatory Vitals:   12/10/23 1358  BP: 109/69  Pulse: 79  Resp: 20  Temp: 98.6 F (37 C)  SpO2: 100%    Filed Weights   12/10/23 1358  Weight: 163 lb 1.6 oz (74 kg)      Physical Exam Constitutional:      General: She is not in acute distress. HENT:     Head: Normocephalic and atraumatic.  Eyes:     General: No scleral icterus. Cardiovascular:     Rate and Rhythm: Normal rate.  Pulmonary:     Effort: Pulmonary effort is normal. No respiratory distress.     Breath sounds: No wheezing.  Abdominal:     General: There is distension.  Musculoskeletal:        General: No deformity. Normal range of motion.     Cervical back: Normal range of motion and neck supple.  Skin:    Coloration: Skin is not jaundiced.     Findings: Lesion present.     Comments: Multiple bug bite sites   Neurological:     Mental Status: She is alert and oriented to person, place, and time. Mental status is at baseline.     LABORATORY DATA:  I have reviewed the data as listed    Latest Ref Rng & Units 12/10/2023    2:01 PM 11/11/2023    1:56 PM 10/21/2023    1:59 PM  CBC  WBC 4.0 - 10.5 K/uL 3.3  3.4  3.3   Hemoglobin 12.0 - 15.0 g/dL 9.6  89.3  89.1   Hematocrit 36.0 - 46.0 % 29.3  32.4  33.0   Platelets 150 - 400 K/uL 70  80  82       Latest Ref Rng & Units 12/10/2023    2:01 PM 11/26/2023    3:00 PM 11/11/2023    1:56 PM  CMP  Glucose 70 - 99 mg/dL 811  790   705   BUN 8 - 23 mg/dL 19  22  23    Creatinine 0.44 - 1.00 mg/dL 8.94  8.96  9.02   Sodium 135 - 145 mmol/L 138  134  135   Potassium 3.5 - 5.1 mmol/L 3.2  3.6  3.7   Chloride 98 - 111 mmol/L 107  104  103   CO2 22 - 32 mmol/L 21  22  23    Calcium  8.9 - 10.3 mg/dL 8.8  8.6  8.7   Total Protein 6.5 - 8.1 g/dL 6.4   6.8   Total Bilirubin 0.0 - 1.2 mg/dL 0.7  0.6   Alkaline Phos 38 - 126 U/L 74   89   AST 15 - 41 U/L 23   21   ALT 0 - 44 U/L 11   12     RADIOGRAPHIC STUDIES: I have personally reviewed the radiological images as listed and agreed with the findings in the report. US  LIVER DOPPLER Result Date: 10/05/2023 CLINICAL DATA:  Portal hypertension EXAM: DUPLEX ULTRASOUND OF LIVER TECHNIQUE: Color and duplex Doppler ultrasound was performed to evaluate the hepatic in-flow and out-flow vessels. COMPARISON:  CT abdomen pelvis 05/28/2023 FINDINGS: Liver: Mildly coarsened parenchymal echogenicity. Subtle nodularity of hepatic contours. No focal lesion, mass or intrahepatic biliary ductal dilatation. Main Portal Vein size: 1.2 cm Portal Vein Velocities Main Prox:  37 cm/sec Main Mid: 35 cm/sec Main Dist:  40 cm/sec Right: 29 cm/sec Left: 25 cm/sec Hepatic Vein Velocities Right:  21 cm/sec Middle:  21 cm/sec Left:  20 cm/sec IVC: Present and patent with normal respiratory phasicity. Hepatic Artery Velocity:  31 cm/sec Splenic Vein Velocity:  22 cm/sec Spleen: 19 cm x 20 cm x 8 cm with a total volume of 1487 cm^3 (411 cm^3 is upper limit normal) Portal Vein Occlusion/Thrombus: No Splenic Vein Occlusion/Thrombus: No Ascites: Mild perihepatic ascites. Varices: None IMPRESSION: 1. Patent portal vein with appropriate direction of flow. 2. Splenomegaly. 3. Mild perihepatic ascites. 4.  Cirrhotic liver morphology without focal hepatic lesion. Electronically Signed   By: Aliene Lloyd M.D.   On: 10/05/2023 16:46

## 2023-12-10 NOTE — Assessment & Plan Note (Signed)
 Follow up with GI [facility] for EGD varices screening.

## 2023-12-10 NOTE — Assessment & Plan Note (Signed)
 Improved distention

## 2023-12-10 NOTE — Assessment & Plan Note (Signed)
 She developed diarrhea before state of Capivasertib  Discussed about Imodium PRN instructions.

## 2023-12-11 LAB — CANCER ANTIGEN 19-9: CA 19-9: 21 U/mL (ref 0–35)

## 2023-12-11 LAB — URINE CULTURE
MICRO NUMBER:: 16958063
Result:: NO GROWTH
SPECIMEN QUALITY:: ADEQUATE

## 2023-12-11 LAB — CANCER ANTIGEN 27.29: CA 27.29: 246.3 U/mL — ABNORMAL HIGH (ref 0.0–38.6)

## 2023-12-14 ENCOUNTER — Ambulatory Visit: Payer: Self-pay | Admitting: Internal Medicine

## 2023-12-15 ENCOUNTER — Telehealth: Payer: Self-pay | Admitting: Internal Medicine

## 2023-12-15 ENCOUNTER — Encounter: Payer: Self-pay | Admitting: Oncology

## 2023-12-15 NOTE — Telephone Encounter (Signed)
 Copied from CRM [id number]. Topic: General - Other >> Dec 15, 2023  1:45 PM Zebedee SAUNDERS wrote: Reason for CRM: Received call from [employer] per [facility] ph: [phone] fax: [phone] please complete DMA need signature and date.

## 2023-12-16 ENCOUNTER — Other Ambulatory Visit (HOSPITAL_COMMUNITY): Payer: Self-pay

## 2023-12-16 NOTE — Telephone Encounter (Signed)
 Paperwork faxed

## 2023-12-18 ENCOUNTER — Other Ambulatory Visit: Payer: Self-pay

## 2023-12-18 ENCOUNTER — Other Ambulatory Visit: Payer: Self-pay | Admitting: Oncology

## 2023-12-18 DIAGNOSIS — C50912 Malignant neoplasm of unspecified site of left female breast: Secondary | ICD-10-CM

## 2023-12-18 MED ORDER — CAPIVASERTIB 160 MG PO TBPK
320.0000 mg | ORAL_TABLET | Freq: Two times a day (BID) | ORAL | 0 refills | Status: DC
  Filled 2023-12-18: qty 64, 16d supply, fill #0
  Filled 2023-12-25: qty 64, 28d supply, fill #0

## 2023-12-22 ENCOUNTER — Other Ambulatory Visit: Payer: Self-pay

## 2023-12-25 ENCOUNTER — Inpatient Hospital Stay: Payer: MEDICAID

## 2023-12-25 ENCOUNTER — Other Ambulatory Visit: Payer: Self-pay

## 2023-12-25 ENCOUNTER — Other Ambulatory Visit: Payer: MEDICAID

## 2023-12-25 ENCOUNTER — Other Ambulatory Visit: Payer: Self-pay | Admitting: Pharmacy Technician

## 2023-12-25 DIAGNOSIS — C50912 Malignant neoplasm of unspecified site of left female breast: Secondary | ICD-10-CM

## 2023-12-25 DIAGNOSIS — Z5111 Encounter for antineoplastic chemotherapy: Secondary | ICD-10-CM | POA: Diagnosis not present

## 2023-12-25 MED ORDER — FULVESTRANT 250 MG/5ML IM SOSY
500.0000 mg | PREFILLED_SYRINGE | Freq: Once | INTRAMUSCULAR | Status: AC
  Administered 2023-12-25: 500 mg via INTRAMUSCULAR
  Filled 2023-12-25: qty 10

## 2023-12-25 NOTE — Progress Notes (Signed)
 Specialty Pharmacy Refill Coordination Note  Sharon Woods is a 63 y.o. female contacted today regarding refills of specialty medication(s) Capivasertib  (TRUQAP )   Patient requested Delivery   Delivery date: 12/30/23   Verified address: [address] [place], [place] [address]   Medication will be filled on 12/29/23.  Spoke to patient's spouse.

## 2023-12-28 ENCOUNTER — Other Ambulatory Visit: Payer: Self-pay

## 2023-12-28 NOTE — Progress Notes (Signed)
 Clinical Intervention Note  Clinical Intervention Notes: Patient's husband reported that she has been taking Rybelsus  for about a month. No DDIs identified with Truqap .   Clinical Intervention Outcomes: Prevention of an adverse drug event   Advertising account planner

## 2023-12-30 ENCOUNTER — Encounter: Payer: Self-pay | Admitting: Oncology

## 2024-01-08 ENCOUNTER — Other Ambulatory Visit: Payer: Self-pay | Admitting: Internal Medicine

## 2024-01-08 ENCOUNTER — Ambulatory Visit
Admission: RE | Admit: 2024-01-08 | Discharge: 2024-01-08 | Disposition: A | Discharge status: Home / Self Care | Payer: MEDICAID | Source: Ambulatory Visit | Attending: Oncology | Admitting: Oncology

## 2024-01-08 ENCOUNTER — Telehealth: Payer: Self-pay | Admitting: Internal Medicine

## 2024-01-08 ENCOUNTER — Ambulatory Visit: Admission: RE | Admit: 2024-01-08 | Payer: MEDICAID | Source: Ambulatory Visit

## 2024-01-08 DIAGNOSIS — C50912 Malignant neoplasm of unspecified site of left female breast: Secondary | ICD-10-CM | POA: Diagnosis present

## 2024-01-08 DIAGNOSIS — F419 Anxiety disorder, unspecified: Secondary | ICD-10-CM

## 2024-01-08 MED ORDER — IOHEXOL 300 MG/ML  SOLN
100.0000 mL | Freq: Once | INTRAMUSCULAR | Status: AC | PRN
Start: 2024-01-08 — End: 2024-01-08
  Administered 2024-01-08: 100 mL via INTRAVENOUS

## 2024-01-08 MED ORDER — HYDROXYZINE HCL 10 MG PO TABS: 10.0000 mg | ORAL_TABLET | Freq: Two times a day (BID) | ORAL | 1 refills | Status: AC

## 2024-01-11 ENCOUNTER — Other Ambulatory Visit: Payer: Self-pay

## 2024-01-12 ENCOUNTER — Other Ambulatory Visit: Payer: Self-pay | Admitting: Oncology

## 2024-01-12 ENCOUNTER — Other Ambulatory Visit: Payer: Self-pay

## 2024-01-12 DIAGNOSIS — C50912 Malignant neoplasm of unspecified site of left female breast: Secondary | ICD-10-CM

## 2024-01-12 MED ORDER — CAPIVASERTIB 160 MG PO TBPK
320.0000 mg | ORAL_TABLET | Freq: Two times a day (BID) | ORAL | 0 refills | Status: DC
  Filled 2024-01-12: qty 64, 28d supply, fill #0

## 2024-01-12 NOTE — Progress Notes (Signed)
 Specialty Pharmacy Ongoing Clinical Assessment Note  Sharon Woods is a 63 y.o. female who is being followed by the specialty pharmacy service for RxSp Oncology   Patient's specialty medication(s) reviewed today: Capivasertib  (TRUQAP )   Missed doses in the last 4 weeks: 0   Patient/Caregiver did not have any additional questions or concerns.   Therapeutic benefit summary: Patient is achieving benefit   Adverse events/side effects summary: Experienced adverse events/side effects (Intermittent diarrhea, treats with imodium)   No data recorded   Goals Addressed             This Visit's Progress    Slow Disease Progression   On track    Patient is on track. Patient will maintain adherence. Patient's CA 27.29 decreased to 246.3 as of 12/10/23, patient had CT and is waiting for results.         Follow up: 3 months  [facility]

## 2024-01-12 NOTE — Progress Notes (Signed)
 Specialty Pharmacy Refill Coordination Note  Sharon Woods is a 63 y.o. female contacted today regarding refills of specialty medication(s) Capivasertib  (TRUQAP )   Patient requested Delivery   Delivery date: 01/22/24   Verified address: [address] [name], [place] [address]   Medication will be filled on 01/21/24. This fill date is pending response to refill request from provider. Patient is aware and if they have not received fill by intended date they must follow up with pharmacy.

## 2024-01-19 ENCOUNTER — Other Ambulatory Visit: Payer: Self-pay | Admitting: *Deleted

## 2024-01-19 NOTE — Telephone Encounter (Signed)
 Patient's husband showed at registration this afternoon to request for his wife's tramadol  rf. Pt informed that a message would be sent to Dr. Babara.

## 2024-01-20 ENCOUNTER — Encounter: Payer: Self-pay | Admitting: Oncology

## 2024-01-20 ENCOUNTER — Other Ambulatory Visit: Payer: Self-pay | Admitting: Oncology

## 2024-01-20 MED ORDER — TRAMADOL HCL 50 MG PO TABS: 50.0000 mg | ORAL_TABLET | Freq: Four times a day (QID) | ORAL | 0 refills | Status: DC | PRN

## 2024-01-21 ENCOUNTER — Other Ambulatory Visit (HOSPITAL_COMMUNITY): Payer: Self-pay

## 2024-01-21 ENCOUNTER — Other Ambulatory Visit: Payer: Self-pay

## 2024-01-22 ENCOUNTER — Other Ambulatory Visit: Payer: Self-pay

## 2024-01-22 ENCOUNTER — Ambulatory Visit: Payer: MEDICAID | Admitting: Internal Medicine

## 2024-01-22 ENCOUNTER — Encounter: Payer: Self-pay | Admitting: Oncology

## 2024-01-22 ENCOUNTER — Inpatient Hospital Stay: Payer: MEDICAID | Admitting: Oncology

## 2024-01-22 ENCOUNTER — Inpatient Hospital Stay: Payer: MEDICAID

## 2024-01-22 ENCOUNTER — Other Ambulatory Visit: Payer: MEDICAID

## 2024-01-22 ENCOUNTER — Other Ambulatory Visit (HOSPITAL_COMMUNITY): Payer: Self-pay

## 2024-01-22 ENCOUNTER — Inpatient Hospital Stay: Payer: MEDICAID | Attending: Radiation Oncology

## 2024-01-22 ENCOUNTER — Ambulatory Visit: Payer: MEDICAID

## 2024-01-22 VITALS — BP 160/76 | HR 70 | Temp 96.7°F | Resp 18 | Wt 172.2 lb

## 2024-01-22 DIAGNOSIS — C7951 Secondary malignant neoplasm of bone: Secondary | ICD-10-CM | POA: Diagnosis not present

## 2024-01-22 DIAGNOSIS — R197 Diarrhea, unspecified: Secondary | ICD-10-CM | POA: Diagnosis not present

## 2024-01-22 DIAGNOSIS — K7460 Unspecified cirrhosis of liver: Secondary | ICD-10-CM

## 2024-01-22 DIAGNOSIS — Z79899 Other long term (current) drug therapy: Secondary | ICD-10-CM | POA: Diagnosis not present

## 2024-01-22 DIAGNOSIS — Z5111 Encounter for antineoplastic chemotherapy: Secondary | ICD-10-CM | POA: Insufficient documentation

## 2024-01-22 DIAGNOSIS — D696 Thrombocytopenia, unspecified: Secondary | ICD-10-CM

## 2024-01-22 DIAGNOSIS — D72810 Lymphocytopenia: Secondary | ICD-10-CM

## 2024-01-22 DIAGNOSIS — C50812 Malignant neoplasm of overlapping sites of left female breast: Secondary | ICD-10-CM | POA: Insufficient documentation

## 2024-01-22 DIAGNOSIS — C50511 Malignant neoplasm of lower-outer quadrant of right female breast: Secondary | ICD-10-CM | POA: Insufficient documentation

## 2024-01-22 DIAGNOSIS — C50912 Malignant neoplasm of unspecified site of left female breast: Secondary | ICD-10-CM

## 2024-01-22 DIAGNOSIS — Z170 Estrogen receptor positive status [ER+]: Secondary | ICD-10-CM

## 2024-01-22 DIAGNOSIS — R188 Other ascites: Secondary | ICD-10-CM

## 2024-01-22 DIAGNOSIS — G893 Neoplasm related pain (acute) (chronic): Secondary | ICD-10-CM

## 2024-01-22 LAB — CBC WITH DIFFERENTIAL (CANCER CENTER ONLY)
Abs Immature Granulocytes: 0.02 K/uL (ref 0.00–0.07)
Basophils Absolute: 0 K/uL (ref 0.0–0.1)
Basophils Relative: 1 %
Eosinophils Absolute: 0.2 K/uL (ref 0.0–0.5)
Eosinophils Relative: 5 %
HCT: 28 % — ABNORMAL LOW (ref 36.0–46.0)
Hemoglobin: 9.2 g/dL — ABNORMAL LOW (ref 12.0–15.0)
Immature Granulocytes: 1 %
Lymphocytes Relative: 24 %
Lymphs Abs: 0.8 K/uL (ref 0.7–4.0)
MCH: 28 pg (ref 26.0–34.0)
MCHC: 32.9 g/dL (ref 30.0–36.0)
MCV: 85.4 fL (ref 80.0–100.0)
Monocytes Absolute: 0.3 K/uL (ref 0.1–1.0)
Monocytes Relative: 10 %
Neutro Abs: 2 K/uL (ref 1.7–7.7)
Neutrophils Relative %: 59 %
Platelet Count: 57 K/uL — ABNORMAL LOW (ref 150–400)
RBC: 3.28 MIL/uL — ABNORMAL LOW (ref 3.87–5.11)
RDW: 15.3 % (ref 11.5–15.5)
WBC Count: 3.3 K/uL — ABNORMAL LOW (ref 4.0–10.5)
nRBC: 0 % (ref 0.0–0.2)

## 2024-01-22 LAB — CMP (CANCER CENTER ONLY)
ALT: 18 U/L (ref 0–44)
AST: 29 U/L (ref 15–41)
Albumin: 3.4 g/dL — ABNORMAL LOW (ref 3.5–5.0)
Alkaline Phosphatase: 68 U/L (ref 38–126)
Anion gap: 8 (ref 5–15)
BUN: 13 mg/dL (ref 8–23)
CO2: 25 mmol/L (ref 22–32)
Calcium: 9.1 mg/dL (ref 8.9–10.3)
Chloride: 104 mmol/L (ref 98–111)
Creatinine: 0.62 mg/dL (ref 0.44–1.00)
GFR, Estimated: >60 mL/min (ref >60)
Glucose, Bld: 199 mg/dL — ABNORMAL HIGH (ref 70–99)
Potassium: 4.2 mmol/L (ref 3.5–5.1)
Sodium: 137 mmol/L (ref 135–145)
Total Bilirubin: 0.7 mg/dL (ref 0.0–1.2)
Total Protein: 6.2 g/dL — ABNORMAL LOW (ref 6.5–8.1)

## 2024-01-22 MED ORDER — FULVESTRANT 250 MG/5ML IM SOSY
500.0000 mg | PREFILLED_SYRINGE | Freq: Once | INTRAMUSCULAR | Status: AC
  Administered 2024-01-22: 500 mg via INTRAMUSCULAR
  Filled 2024-01-22: qty 10

## 2024-01-22 MED ORDER — CAPIVASERTIB 160 MG PO TBPK
320.0000 mg | ORAL_TABLET | Freq: Two times a day (BID) | ORAL | 0 refills | Status: AC
Start: 2024-01-22 — End: ?
  Filled 2024-01-22: qty 64, 16d supply, fill #0
  Filled 2024-02-12: qty 64, 28d supply, fill #0

## 2024-01-22 NOTE — Assessment & Plan Note (Addendum)
 Due to splenomegaly

## 2024-01-22 NOTE — Assessment & Plan Note (Signed)
 See above plan.

## 2024-01-22 NOTE — Assessment & Plan Note (Signed)
 Tramadol 50mg  Q6h PRN

## 2024-01-22 NOTE — Assessment & Plan Note (Addendum)
 Bilateral breast invasive lobular carcinoma, s/p bilateral mastectomy. [name]   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 CT-guided right cervical lymphadenopathy pathology showed metastatic poorly differentiated carcinoma, with morphology consistent with breast primary.  NGS PIK3CA, CDH1, TMB 13.2 pMMR,.  ER +90%/PR - 0% /HER2- (0)  Recommend to continue monthly fulvestrant  injection. She tolerates Capivasertib  320mg  BID D1-4 every 7 days.  Continue current regimen.  Repeat CT scan showed stable disease.

## 2024-01-22 NOTE — Assessment & Plan Note (Signed)
 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  Drug induced leukopenia vs other etiologies.  Continue observation.

## 2024-01-22 NOTE — Progress Notes (Signed)
 Pt here follow-up. Reports having a headache 6/10 today.

## 2024-01-22 NOTE — Assessment & Plan Note (Signed)
 Follow up with GI [facility] for EGD varices screening.

## 2024-01-22 NOTE — Assessment & Plan Note (Signed)
 Chronic issue for her She developed diarrhea before start of Capivasertib  Discussed about Imodium PRN instructions.

## 2024-01-22 NOTE — Progress Notes (Signed)
 Hematology/Oncology Progress note [phone] [phone]       CHIEF COMPLAINTS/REASON FOR VISIT:  bilateral breast cancer, thrombocytopenia, leukopenia  ASSESSMENT & PLAN:   Cancer Staging  Malignant neoplasm of left breast, stage 4 (HCC) Staging form: Breast, AJCC 8th Edition - Pathologic stage from 11/25/2021: Stage IB (pT2, pN1, cM0, G2, ER+, PR+, HER2-) - Signed by Babara Call, MD on 11/25/2021 - Pathologic stage from 07/25/2022: Stage IV (rpTX, rpNX, rpM1, ER+, PR: Not Assessed, HER2-) - Signed by Babara Call, MD on 08/14/2022  Malignant neoplasm of lower-outer quadrant of right breast of female, estrogen receptor positive (HCC) Staging form: Breast, AJCC 8th Edition - Pathologic stage from 11/25/2021: Stage IIIA (pT1c, pN3a, cM0, G2, ER+, PR+, HER2-) - Signed by Babara Call, MD on 11/25/2021   Malignant neoplasm of left breast, 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 [name]   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 CT-guided right cervical lymphadenopathy pathology showed metastatic poorly differentiated carcinoma, with morphology consistent with  breast primary.  NGS PIK3CA, CDH1, TMB 13.2 pMMR,.  ER +90%/PR - 0% /HER2- (0)  Recommend to continue monthly fulvestrant  injection. She tolerates Capivasertib  320mg  BID D1-4 every 7 days.  Continue current regimen.  Repeat CT scan showed stable disease.     Diarrhea Chronic issue for her She developed diarrhea before start of Capivasertib  Discussed about Imodium PRN instructions.   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  Drug induced leukopenia vs other etiologies.  Continue observation.   Liver cirrhosis (HCC) Follow up with GI Uptodate for EGD varices screening.   Thrombocytopenia Due to splenomegaly.       Neoplasm related pain Tramadol  50mg  Q6h PRN  Malignant neoplasm of lower-outer quadrant of right breast of female, estrogen receptor positive (HCC) See above plan.   Plan was discussed with patient and her husband  No orders of the defined types were placed in this encounter.  Follow up per LOS [name], questions or concerns.  Call Babara, MD, PhD [facility] Hematology Oncology 01/22/2024     HISTORY OF PRESENTING ILLNESS:   is a  63 y.o.  female presents for follow up of bilateral breast cancer, thrombocytopenia and leukopenia.  Oncology History  Malignant neoplasm of left breast, stage 4 (HCC)  08/06/2021 Mammogram   08/06/2021, digital bilateral mammogram and ultrasound showed left breast 3:00 mass, 1.7 x 1.7 x 1.7 cm, ultrasound of the left axillary is negative. 08/21/2021 right breast 1 x 0.7 x 0.8 cm angulated spiculated mass at the right breast 7:00, 5 cm from nipple.  Ultrasound of the right axillary demonstrates 3 abnormal thickened cortex lymph node. Patient was recommended to proceed  with biopsy left breast 3:00 invasive mammry carcinoma with  lobular features. in situ carcinoma, lobular neoplasia. 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 Lael Hines right breast 7:00, invasive mammary carcinoma with lobular features, in situ carcinoma,  ER+, PR+, HER 2-This was found to be concordant by Dr. Lael Hines. right axilla lymph node +, extracapsular extension.  -This was found to be concordant by Dr. Lael Hines   08/28/2021 Initial Diagnosis   Malignant neoplasm of upper-outer quadrant of left breast in female, estrogen receptor positive (HCC)   08/28/2021 Imaging   CT CHEST, ABDOMEN, AND PELVIS WITH CONTRAST Asymmetric mildly enlarged right axillary nodes. Correlate with biopsy. There are some punctate foci of sclerosis in the included spine that could reflect subtle metastatic disease. Nonobstructing bilateral renal calculi.   08/29/2021 Imaging   BILATERAL BREAST MRI WITH AND WITHOUT CONTRAST 1. 2.5 centimeter enhancing mass in the LOWER OUTER QUADRANT of the RIGHT breast, correlating with known malignancy. 2. At least 4 enlarged RIGHT axillary lymph nodes, correlating well with recently biopsied lymph node showing metastatic disease. 3. 3.4 centimeter enhancing mass in the LEFT breast, with an additional 2.9 centimeters of non mass enhancement posterior to the mass also suspicious for malignancy. This likely correlates with the area calcifications seen mammographically. 4. 5 millimeters satellite nodule along the LATERAL aspect of known malignancy in the LOWER OUTER QUADRANT of the LEFT breast. 5. LEFT axilla is negative for adenopathy.       09/05/2021 Imaging   NUCLEAR MEDICINE WHOLE BODY BONE SCAN Uptake at L2 which is nonspecific but may be related to advanced degenerative disc and facet disease changes at both L1-L2 and L2-L3; no evidence of osseous metastatic disease by CT. No definite osseous metastatic lesions identified.    Genetic Testing   Negative genetic testing. No pathogenic variants  identified on the [facility] CancerNext-Expanded+RNA panel. The report date is 10/03/2021.  The CancerNext-Expanded + RNAinsight gene panel offered by [employer] and includes sequencing and rearrangement analysis for the following 77 genes: IP, ALK, APC*, ATM*, AXIN2, BAP1, BARD1, BLM, BMPR1A, BRCA1*, BRCA2*, BRIP1*, CDC73, CDH1*,CDK4, CDKN1B, CDKN2A, CHEK2*, CTNNA1, DICER1, FANCC, FH, FLCN, GALNT12, KIF1B, LZTR1, MAX, MEN1, MET, MLH1*, MSH2*, MSH3, MSH6*, MUTYH*, NBN, NF1*, NF2, NTHL1, PALB2*, PHOX2B, PMS2*, POT1, PRKAR1A, PTCH1, PTEN*, RAD51C*, RAD51D*,RB1, RECQL, RET, SDHA, SDHAF2, SDHB, SDHC, SDHD, SMAD4, SMARCA4, SMARCB1, SMARCE1, STK11, SUFU, TMEM127, TP53*,TSC1, TSC2, VHL and XRCC2 (sequencing and deletion/duplication); EGFR, EGLN1, HOXB13, KIT, MITF, PDGFRA, POLD1 and POLE (sequencing only); EPCAM and GREM1 (deletion/duplication only).   11/15/2021 Surgery   She underwent left simple mastectomy and right modified mastectomy.   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  pT2 pN1a, ER +90%, PR +90%, HER2 negative (1+)  # Right Breast invasive lobular carcinoma Grade 2, negative margins, right axillary lymph nodes  30/32 involved with metastatic carcinoma, pT1c pN3a ER +90%, PR +51-90%, HER2 negative (1+)    11/25/2021 Cancer Staging   Staging form: Breast, AJCC 8th Edition - Pathologic stage from 11/25/2021: Stage IB (pT2, pN1, cM0, G2, ER+, PR+, HER2-) - Signed by Babara Call, MD on 11/25/2021 Stage prefix: Initial diagnosis Multigene prognostic tests performed: None Histologic grading system: 3 grade system   01/09/2022 - 03/10/2022 Radiation Therapy   Adjuvant breast Radiation.    07/25/2022 Cancer Staging   Staging  form: Breast, AJCC 8th Edition - Pathologic stage from  07/25/2022: Stage IV (rpTX, pNX, pM1, ER+, PR: Not Assessed, HER2-) - Signed by Babara Call, MD on 08/14/2022 Stage prefix: Recurrence Multigene prognostic tests performed: None   07/25/2022 Procedure   Colonoscopy showed Findings - Six 5 to 6 mm polyps in the ascending colon, removed with a cold snare. Resected and retrieved. - One 10 mm polyp in the descending colon, removed with a cold snare. Resected and retrieved. Clip was placed. - One 30 mm polyp in the rectum, removed with a hot snare. Resected and retrieved. Clips were placed.  Pathology showed A.  COLON POLYP X 6, ASCENDING AND CECUM; COLD SNARE: - COLONIC MUCOSA WITH METASTATIC LOBULAR BREAST CARCINOMA (3) - HYPERPLASTIC POLYP (3)  B.  COLON POLYP, DESCENDING; COLD SNARE: - TUBULAR ADENOMA. - NEGATIVE FOR HIGH-GRADE DYSPLASIA AND MALIGNANCY.  C.  RECTUM POLYP; HOT SNARE: - TUBULOVILLOUS ADENOMA. - NEGATIVE FOR HIGH-GRADE DYSPLASIA AND MALIGNANCY.  Comment: The metastatic lobular breast carcinoma is positive for GATA-3 and ER (strong staining in greater than 90% of the tumor cells). HER2 negative (1+)     08/06/2022 Imaging   PET  Status post bilateral mastectomy with radiation changes in the anterior upper lobes.   No findings suspicious for recurrent or metastatic disease   09/18/2022 Imaging   MRI brain w wo  1. No evidence of intracranial metastatic disease. 2. Mild multifocal T2 FLAIR hyperintense signal abnormality within the cerebral white matter, nonspecific but most often secondary to chronic small vessel ischemia. 3. Paranasal sinus disease as described.   01/27/2023 Imaging   US  left axillary No sonographic evidence of malignancy at the site of palpable concern in the LEFT axilla.    04/22/2023 Imaging   CT chest abdomen pelvis w contrast showed 1. New right chest wall soft tissue lesion which may represent locally recurrent or metastatic disease. Attention on follow-up. 2. New right seventh rib sclerotic  osseous lesion. 3. Splenomegaly.    05/11/2023 Procedure   CT guided biopsy of right axillary nodule   BENIGN FIBROVASCULAR TISSUE.       NO LYMPHOID TISSUE IDENTIFIED.       NEGATIVE FOR MALIGNANCY.    08/19/2023 Imaging   PET scan showed 1. 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 2. Small but hypermetabolic right level IIA, right level IIb, right level V, and bilateral inguinal lymph nodes, suspicious for metastatic disease. 3. Progressive perihepatic ascites, splenomegaly, and findings of portal venous hypertension including upstream varices adjacent to the distal esophagus and collateral vessels/varices in the gastrohepatic ligament. Progressive edema along the gastrohepatic ligament, peripancreatic region, and retroperitoneum. 4. Nonobstructive bilateral nephrolithiasis. 5.  Aortic Atherosclerosis (ICD10-I70.0).     09/11/2023 Procedure   1. Lymph node, needle/core biopsy, right neck :      -  METASTATIC POORLY DIFFERENTIATED CARCINOMA MORPHOLOGIC CONSISTENT WITH THE      CLINICAL HISTORY OF BREAST PRIMARY (HISTORY OF INVASIVE LOBULAR CARCINOMA OF      BILATERAL BREAST).      NOTE: CLINICAL HISTORY OF STAGE IV BREAST CARCINOMA (INVASIVE LOBULAR OF BOTH      LEFT AND RIGHT) IS NOTED.  THE FINDINGS ARE MORPHOLOGICALLY CONSISTENT WITH      METASTATIC BREAST CARCINOMA.  NOTE CONFIRMATORY IMMUNOHISTOCHEMICAL STAINS ARE PERFORMED GIVEN THE MORPHOLOGIC IMPRESSION AND TO PRESERVE TISSUE FOR CLINICIAN  DIRECTED TESTING.    10/08/2023 -  Chemotherapy   start Capivasertib     Malignant  neoplasm of lower-outer quadrant of right breast of female, estrogen receptor positive (HCC)  08/06/2021 Mammogram   08/06/2021, digital bilateral mammogram and ultrasound showed left breast 3:00 mass, 1.7 x 1.7 x 1.7 cm, ultrasound of the left axillary is negative. 08/21/2021 right breast 1 x 0.7 x 0.8 cm angulated spiculated mass at the  right breast 7:00, 5 cm from nipple.  Ultrasound of the right axillary demonstrates 3 abnormal thickened cortex lymph node. 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 Lael Hines right breast 7:00, invasive mammary carcinoma with lobular features, in situ carcinoma,  ER+, PR+, HER 2-This was found to be concordant by Dr. Lael Hines. right axilla lymph node +, extracapsular extension.  -This was found to be concordant by Dr. Lael Hines   08/28/2021 Initial Diagnosis   Malignant neoplasm of lower-outer quadrant of right breast of female, estrogen receptor positive (HCC)   08/28/2021 Imaging   CT CHEST, ABDOMEN, AND PELVIS WITH CONTRAST Asymmetric mildly enlarged right axillary nodes. Correlate with biopsy. There are some punctate foci of sclerosis in the included spine that could reflect subtle metastatic disease. Nonobstructing bilateral renal calculi.   08/29/2021 Imaging   BILATERAL BREAST MRI WITH AND WITHOUT CONTRAST 1. 2.5 centimeter enhancing mass in the LOWER OUTER QUADRANT of the RIGHT breast, correlating with known malignancy. 2. At least 4 enlarged RIGHT axillary lymph nodes, correlating well with recently biopsied lymph node showing metastatic disease. 3. 3.4 centimeter enhancing mass in the LEFT breast, with an additional 2.9 centimeters of non mass enhancement posterior to the mass also suspicious for malignancy. This likely correlates with the area calcifications seen mammographically. 4. 5 millimeters satellite nodule along the LATERAL aspect of known malignancy in the LOWER OUTER QUADRANT of the LEFT breast. 5. LEFT axilla is negative for adenopathy.       09/05/2021 Imaging   NUCLEAR MEDICINE WHOLE BODY BONE SCAN Uptake at L2 which is nonspecific but may be related to advanced degenerative disc and facet  disease changes at both L1-L2 and L2-L3; no evidence of osseous metastatic disease by CT. No definite osseous metastatic lesions identified.   09/09/2021 Oncotype testing   [id number] block RIGHT 7:00 5 CM FN; ULTRASOUND-GUIDED BIOPSY Oncotype Dx RS score 22    Genetic Testing   Negative genetic testing. No pathogenic variants identified on the [facility] CancerNext-Expanded+RNA panel. The report date is 10/03/2021.  The CancerNext-Expanded + RNAinsight gene panel offered by [employer] and includes sequencing and rearrangement analysis for the following 77 genes: IP, ALK, APC*, ATM*, AXIN2, BAP1, BARD1, BLM, BMPR1A, BRCA1*, BRCA2*, BRIP1*, CDC73, CDH1*,CDK4, CDKN1B, CDKN2A, CHEK2*, CTNNA1, DICER1, FANCC, FH, FLCN, GALNT12, KIF1B, LZTR1, MAX, MEN1, MET, MLH1*, MSH2*, MSH3, MSH6*, MUTYH*, NBN, NF1*, NF2, NTHL1, PALB2*, PHOX2B, PMS2*, POT1, PRKAR1A, PTCH1, PTEN*, RAD51C*, RAD51D*,RB1, RECQL, RET, SDHA, SDHAF2, SDHB, SDHC, SDHD, SMAD4, SMARCA4, SMARCB1, SMARCE1, STK11, SUFU, TMEM127, TP53*,TSC1, TSC2, VHL and XRCC2 (sequencing and deletion/duplication); EGFR, EGLN1, HOXB13, KIT, MITF, PDGFRA, POLD1 and POLE (sequencing only); EPCAM and GREM1 (deletion/duplication only).   11/15/2021 Surgery   She underwent left simple mastectomy and right modified mastectomy.   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  pT2 pN1a, ER +90%, PR +90%, HER2 negative (1+)  #  Right Breast invasive lobular carcinoma Grade 2, negative margins, right axillary lymph nodes  30/32 involved with metastatic carcinoma, pT1c pN3a ER +90%, PR +51-90%, HER2 negative (1+)    11/25/2021 Cancer Staging   Staging form: Breast, AJCC 8th Edition - Pathologic stage from 11/25/2021: Stage IIIA (pT1c, pN3a, cM0, G2, ER+, PR+,  HER2-) - Signed by Babara Call, MD on 11/25/2021 Stage prefix: Initial diagnosis Multigene prognostic tests performed: None Histologic grading system: 3 grade system   12/10/2021 Imaging   PET scan  1. Interval bilateral mastectomy and right axillary node dissection.No evidence of residual disease in the chest wall, nodal metastases or distant metastases. 2. Focal hypermetabolic activity in the proximal rectum corresponding with an intraluminal polypoid lesion, suspicious for a villous adenoma or early colon cancer. Sigmoidoscopy/colonoscopy recommended unless recently performed   12/10/2021 Imaging   1. Interval bilateral mastectomy and right axillary node dissection.No evidence of residual disease in the chest wall, nodal metastasesor distant metastases. 2. Focal hypermetabolic activity in the proximal rectum corresponding with an intraluminal polypoid lesion, suspicious for a villous adenoma or early colon cancer. Sigmoidoscopy/colonoscopy recommended unless recently performed    She is a poor historian. History of major depression, psychosis, previously on olanzapine  and Remeron  not currently on any and if not currently following up with psychiatrist. Patient's family history is positive for sister with breast cancer   INTERVAL HISTORY Sharon Woods is a 63 y.o. female who has above history reviewed by me today presents for follow up visit for management of bilateral breast cancer She denies pain today  She is accompanied by her husband. + diarrhea, 2-3 times per day.  She takes Capivasertib , tolerates well.  S/p EGD in August 2025  She continues to have bedbug infestation at home.   Review of Systems  Constitutional:  Negative for appetite change, chills, fatigue and fever.  HENT:   Negative for hearing loss and voice change.   Eyes:  Negative for eye problems.  Respiratory:  Negative for chest tightness and cough.   Cardiovascular:  Negative for chest pain.  Gastrointestinal:   Positive for diarrhea. Negative for abdominal distention, abdominal pain and blood in stool.  Endocrine: Negative for hot flashes.  Genitourinary:  Negative for difficulty urinating and frequency.   Musculoskeletal:  Negative for arthralgias.  Skin:  Negative for itching and rash.  Neurological:  Negative for extremity weakness.  Hematological:  Negative for adenopathy.  Psychiatric/Behavioral:  Negative for confusion.     MEDICAL HISTORY:  Past Medical History:  Diagnosis Date   Anxiety    Cancer (HCC)    Depression    Diabetes mellitus without complication (HCC)    History of high cholesterol 2000   Hyperlipidemia    Hypertension    Patient denies medical problems    Thrombocytopenia     SURGICAL HISTORY: Past Surgical History:  Procedure Laterality Date   ABDOMINAL HYSTERECTOMY     AXILLARY SENTINEL NODE BIOPSY Left 11/15/2021   Procedure: AXILLARY SENTINEL NODE BIOPSY;  Surgeon: Lane Shope, MD;  Location: [facility] ORS;  Service: General;  Laterality: Left;   BREAST BIOPSY Right 08/21/2021   us  bx 7:00 mass coil clip path pending   BREAST BIOPSY Right 08/21/2021   us  bx of LN, hydro marker, path pending   BREAST BIOPSY Left 08/21/2021   us  bx, heart marker, path pending   CESAREAN SECTION     x2   COLONOSCOPY WITH PROPOFOL  N/A 07/25/2022   Procedure: COLONOSCOPY WITH PROPOFOL ;  Surgeon: Therisa,  Ruel, MD;  Location: [facility] ENDOSCOPY;  Service: Gastroenterology;  Laterality: N/A;   ESOPHAGOGASTRODUODENOSCOPY N/A 11/02/2023   Procedure: EGD (ESOPHAGOGASTRODUODENOSCOPY);  Surgeon: Therisa Ruel, MD;  Location[facility];  Service: Gastroenterology;  Laterality: N/A;   MASTECTOMY     MASTECTOMY MODIFIED RADICAL Right 11/15/2021   Procedure: MASTECTOMY MODIFIED RADICAL;  Surgeon: Lane Shope, MD;  Location: [facility] ORS;  Service: General;  Laterality: Right;   NO PAST SURGERIES     TOTAL MASTECTOMY Left 11/15/2021   Procedure: TOTAL MASTECTOMY;  Surgeon: Lane Shope,  MD;  Location: [facility] ORS;  Service: General;  Laterality: Left;    SOCIAL HISTORY: Social History   Socioeconomic History   Marital status: Married    Spouse name: Not on file   Number of children: Not on file   Years of education: Not on file   Highest education level: Not on file  Occupational History   Not on file  Tobacco Use   Smoking status: Former    Current packs/day: 0.00    Types: Cigarettes    Quit date: 2013    Years since quitting: 12.8    Passive exposure: Current   Smokeless tobacco: Never  Vaping Use   Vaping status: Never Used  Substance and Sexual Activity   Alcohol use: Not Currently    Comment: occasionally   Drug use: No   Sexual activity: Not Currently    Comment: unable to assess   Other Topics Concern   Not on file  Social History Narrative   Not on file   Social Drivers of Health   Financial Resource Strain: Medium Risk (10/05/2023)   Received from [facility]   Overall Financial Resource Strain (CARDIA)    Difficulty of Paying Living Expenses: Somewhat hard  Food Insecurity: No Food Insecurity (10/05/2023)   Received from [facility]   Hunger Vital Sign    Within the past 12 months, you worried that your food would run out before you got the money to buy more.: Never true    Within the past 12 months, the food you bought just didn't last and you didn't have money to get more.: Never true  Transportation Needs: No Transportation Needs (10/05/2023)   Received from [facility] - Transportation    In the past 12 months, has lack of transportation kept you from medical appointments or from getting medications?: No    Lack of Transportation (Non-Medical): No  Physical Activity: Inactive (10/10/2021)   Exercise Vital Sign    Days of Exercise per Week: 0 days    Minutes of Exercise per Session: 0 min  Stress: Stress Concern Present (10/10/2021)   [employer] [facility] Occup[employer] -  Occupational Stress Questionnaire    Feeling of Stress : To some extent  Social Connections: Socially Isolated (10/10/2021)   Social Connection and Isolation Panel    Frequency of Communication with Friends and Family: Once a week    Frequency of Social Gatherings with Friends and Family: Never    Attends Religious Services: Never    Database administrator or Organizations: No    Attends Engineer, structural: Never    Marital Status: Married  Catering manager Violence: Not on file    FAMILY HISTORY: Family History  Problem Relation Age of Onset   Lung cancer Mother    Dementia Mother    Parkinson's disease Father    Cancer Father  unk type   Diabetes Sister    Heart attack Sister    Breast cancer Sister    Cancer Maternal Uncle        unk types   Dementia Maternal Grandmother    Cancer Maternal Grandmother        unk type   Cancer Other    Dementia Other     ALLERGIES:  is allergic to tylenol  [acetaminophen ] and aleve [naproxen].  MEDICATIONS:  Current Outpatient Medications  Medication Sig Dispense Refill   ACCU-CHEK GUIDE test strip USE TO CHECK BLOOD SUGAR UP TO 4 TIMES DAILY AS DIRECTED 100 each 0   Accu-Chek Softclix Lancets lancets USE TO CHECK BLOOD SUAGR UP TO 4 TIMES DAILY AS DIRECTED 100 each 0   blood glucose meter kit and supplies KIT Dispense based on patient and insurance preference. Use up to four times daily as directed. 1 each 1   calcium  carbonate (OS-CAL) 600 MG TABS tablet Take 2 tablets (1,200 mg total) by mouth daily. 60 tablet 5   capivasertib  (TRUQAP ) 160 MG pack Take 2 tablets (320 mg total) by mouth 2 (two) times daily. Take for 4 days, then hold for 3 days. Repeat every 7 days. 64 tablet 0   cyanocobalamin  (VITAMIN B12) 1000 MCG tablet Take 1 tablet (1,000 mcg total) by mouth daily. 30 tablet 2   escitalopram  (LEXAPRO ) 20 MG tablet Take 1 tablet (20 mg total) by mouth daily. 90 tablet 1   famotidine  (PEPCID ) 40 MG tablet Take 1  tablet (40 mg total) by mouth daily. 90 tablet 1   hydrOXYzine  (ATARAX ) 10 MG tablet Take 1 tablet (10 mg total) by mouth 2 (two) times daily. 180 tablet 1   Iron -Vitamin C  65-125 MG TABS Take 1 tablet by mouth daily. 30 tablet 2   metFORMIN  (GLUCOPHAGE ) 500 MG tablet Take 1 tablet (500 mg total) by mouth 2 (two) times daily with a meal. 180 tablet 1   ondansetron  (ZOFRAN ) 8 MG tablet Take 1 tablet (8 mg total) by mouth every 8 (eight) hours as needed for nausea or vomiting. 20 tablet 2   potassium chloride  SA (KLOR-CON  M) 20 MEQ tablet Take 1 tablet (20 mEq total) by mouth daily. 3 tablet 0   rosuvastatin  (CRESTOR ) 5 MG tablet Take 1 tablet (5 mg total) by mouth daily. 90 tablet 1   Semaglutide  (RYBELSUS ) 3 MG TABS Take 1 tablet (3 mg total) by mouth daily. 90 tablet 1   traMADol  (ULTRAM ) 50 MG tablet Take 1 tablet (50 mg total) by mouth every 6 (six) hours as needed. 60 tablet 0   triamcinolone  ointment (KENALOG ) 0.5 % Apply 1 Application topically 2 (two) times daily. 30 g 1   ZINC  OXIDE, TOPICAL, 10 % CREA Apply 1 each topically daily. 78 g 0   No current facility-administered medications for this visit.     PHYSICAL EXAMINATION: ECOG PERFORMANCE STATUS: 1 - Symptomatic but completely ambulatory Vitals:   01/22/24 0950 01/22/24 0955  BP: (!) 148/93 (!) 160/76  Pulse: 70   Resp: 18   Temp: (!) 96.7 F (35.9 C)     Filed Weights   01/22/24 0950  Weight: 172 lb 3.2 oz (78.1 kg)      Physical Exam Constitutional:      General: She is not in acute distress. HENT:     Head: Normocephalic and atraumatic.  Eyes:     General: No scleral icterus. Cardiovascular:     Rate and Rhythm: Normal rate.  Pulmonary:     Effort: Pulmonary effort is normal. No respiratory distress.     Breath sounds: No wheezing.  Abdominal:     General: There is distension.  Musculoskeletal:        General: No deformity. Normal range of motion.     Cervical back: Normal range of motion and neck  supple.  Skin:    Coloration: Skin is not jaundiced.     Findings: Lesion present.     Comments: Multiple bug bite sites   Neurological:     Mental Status: She is alert and oriented to person, place, and time. Mental status is at baseline.     LABORATORY DATA:  I have reviewed the data as listed    Latest Ref Rng & Units 01/22/2024    9:50 AM 12/10/2023    2:01 PM 11/11/2023    1:56 PM  CBC  WBC 4.0 - 10.5 K/uL 3.3  3.3  3.4   Hemoglobin 12.0 - 15.0 g/dL 9.2  9.6  89.3   Hematocrit 36.0 - 46.0 % 28.0  29.3  32.4   Platelets 150 - 400 K/uL 57  70  80       Latest Ref Rng & Units 01/22/2024    9:50 AM 12/10/2023    2:01 PM 11/26/2023    3:00 PM  CMP  Glucose 70 - 99 mg/dL 800  811  790   BUN 8 - 23 mg/dL 13  19  22    Creatinine 0.44 - 1.00 mg/dL 9.37  8.94  8.96   Sodium 135 - 145 mmol/L 137  138  134   Potassium 3.5 - 5.1 mmol/L 4.2  3.2  3.6   Chloride 98 - 111 mmol/L 104  107  104   CO2 22 - 32 mmol/L 25  21  22    Calcium  8.9 - 10.3 mg/dL 9.1  8.8  8.6   Total Protein 6.5 - 8.1 g/dL 6.2  6.4    Total Bilirubin 0.0 - 1.2 mg/dL 0.7  0.7    Alkaline Phos 38 - 126 U/L 68  74    AST 15 - 41 U/L 29  23    ALT 0 - 44 U/L 18  11      RADIOGRAPHIC STUDIES: I have personally reviewed the radiological images as listed and agreed with the findings in the report. CT CHEST ABDOMEN PELVIS W CONTRAST Result Date: 01/11/2024 CLINICAL DATA:  Breast cancer * Tracking Code: BO * EXAM: CT CHEST, ABDOMEN, AND PELVIS WITH CONTRAST TECHNIQUE: Multidetector CT imaging of the chest, abdomen and pelvis was performed following the standard protocol during bolus administration of intravenous contrast. RADIATION DOSE REDUCTION: This exam was performed according to the departmental dose-optimization program which includes automated exposure control, adjustment of the mA and/or kV according to patient size and/or use of iterative reconstruction technique. CONTRAST:  OMNIPAQUE  IOHEXOL  300 MG/ML   SOLN COMPARISON:  PET-CT, 08/19/2023 FINDINGS: CT CHEST FINDINGS Cardiovascular: No significant vascular findings. Normal heart size. No pericardial effusion. Mediastinum/Nodes: No enlarged mediastinal, hilar, or axillary lymph nodes. Thyroid gland, trachea, and esophagus demonstrate no significant findings. Lungs/Pleura: Minimal paraseptal emphysema minimal subpleural radiation fibrosis of the anterior lungs. Occasional very tiny pulmonary nodules unchanged, for example a 0.2 cm nodule of the left apex (series 4, image 22). No pleural effusion or pneumothorax. Musculoskeletal: Bilateral mastectomy.  No acute osseous findings. CT ABDOMEN PELVIS FINDINGS Hepatobiliary: No solid liver abnormality is seen. Coarse contour of the liver. No gallstones,  gallbladder wall thickening, or biliary dilatation. Pancreas: Unremarkable. No pancreatic ductal dilatation or surrounding inflammatory changes. Spleen: Splenomegaly, maximum span 15.2 cm. Adrenals/Urinary Tract: Adrenal glands are unremarkable. Nonobstructive calculus of the inferior pole of the left kidney. No right-sided calculi, ureteral calculi, or hydronephrosis. Bladder is unremarkable. Stomach/Bowel: Mucosal thickening and hyperenhancement of the distal gastric body, antrum, and pylorus (series 2, image 61). Appendix appears normal. No evidence of bowel wall thickening, distention, or inflammatory changes. Vascular/Lymphatic: Aortic atherosclerosis. Small gastroesophageal varices. No enlarged abdominal or pelvic lymph nodes. Reproductive: Hysterectomy. Other: No abdominal wall hernia or abnormality. Small volume ascites throughout the abdomen pelvis, similar to prior examination. Musculoskeletal: No acute osseous findings. Similar CT appearance of lytic, previously FDG avid osseous metastases, including of the anterior aspect of the L3 vertebral body (series 2, image 70) and the left posterior acetabulum (series 2, image 109). IMPRESSION: 1. Status post bilateral  mastectomy. 2. Similar CT appearance of lytic, previously FDG avid osseous metastases, including of the anterior aspect of the L3 vertebral body and the left posterior acetabulum. 3. No enlarged lymph nodes in the chest, abdomen, or pelvis. Previously seen FDG avid inguinal lymph nodes are unchanged in appearance and not pathologically enlarged by CT. 4. Occasional very tiny pulmonary nodules unchanged, most likely benign and incidental. Attention on follow-up. 5. Cirrhosis and splenomegaly. Small volume ascites throughout the abdomen pelvis, similar to prior examination. Small gastroesophageal varices. 6. Mucosal thickening and hyperenhancement of the distal gastric body, antrum, and pylorus, suggesting nonspecific gastritis. 7. Nonobstructive left nephrolithiasis. Aortic Atherosclerosis (ICD10-I70.0) and Emphysema (ICD10-J43.9). Electronically Signed   By: Marolyn JONETTA Jaksch M.D.   On: 01/11/2024 07:02

## 2024-01-23 LAB — CANCER ANTIGEN 27.29: CA 27.29: 344.3 U/mL — ABNORMAL HIGH (ref 0.0–38.6)

## 2024-01-24 LAB — CANCER ANTIGEN 15-3: CA 15-3: 283 U/mL — ABNORMAL HIGH (ref 0.0–25.0)

## 2024-02-12 ENCOUNTER — Other Ambulatory Visit: Payer: Self-pay

## 2024-02-16 ENCOUNTER — Other Ambulatory Visit (HOSPITAL_COMMUNITY): Payer: Self-pay

## 2024-02-18 ENCOUNTER — Other Ambulatory Visit: Payer: Self-pay

## 2024-02-18 ENCOUNTER — Other Ambulatory Visit (HOSPITAL_COMMUNITY): Payer: Self-pay

## 2024-02-18 NOTE — Progress Notes (Signed)
 Specialty Pharmacy Refill Coordination Note  Sharon Woods is a 63 y.o. female contacted today regarding refills of specialty medication(s) Capivasertib  (TRUQAP )   Patient requested Delivery   Delivery date: 02/23/24   Verified address: [address] [place], [place] [address]   Medication will be filled on: 02/22/24

## 2024-02-22 ENCOUNTER — Encounter: Payer: Self-pay | Admitting: Internal Medicine

## 2024-02-22 ENCOUNTER — Ambulatory Visit (INDEPENDENT_AMBULATORY_CARE_PROVIDER_SITE_OTHER): Payer: MEDICAID | Admitting: Internal Medicine

## 2024-02-22 ENCOUNTER — Inpatient Hospital Stay: Payer: MEDICAID | Attending: Radiation Oncology

## 2024-02-22 ENCOUNTER — Other Ambulatory Visit: Payer: Self-pay

## 2024-02-22 VITALS — BP 126/72 | HR 80 | Temp 98.2°F | Resp 16 | Ht 64.0 in | Wt 172.4 lb

## 2024-02-22 DIAGNOSIS — Z5111 Encounter for antineoplastic chemotherapy: Secondary | ICD-10-CM | POA: Insufficient documentation

## 2024-02-22 DIAGNOSIS — Z79899 Other long term (current) drug therapy: Secondary | ICD-10-CM | POA: Diagnosis not present

## 2024-02-22 DIAGNOSIS — R300 Dysuria: Secondary | ICD-10-CM

## 2024-02-22 DIAGNOSIS — C50912 Malignant neoplasm of unspecified site of left female breast: Secondary | ICD-10-CM

## 2024-02-22 DIAGNOSIS — E1169 Type 2 diabetes mellitus with other specified complication: Secondary | ICD-10-CM

## 2024-02-22 DIAGNOSIS — Z23 Encounter for immunization: Secondary | ICD-10-CM

## 2024-02-22 DIAGNOSIS — Z7984 Long term (current) use of oral hypoglycemic drugs: Secondary | ICD-10-CM

## 2024-02-22 DIAGNOSIS — C50812 Malignant neoplasm of overlapping sites of left female breast: Secondary | ICD-10-CM | POA: Diagnosis present

## 2024-02-22 DIAGNOSIS — C50511 Malignant neoplasm of lower-outer quadrant of right female breast: Secondary | ICD-10-CM | POA: Insufficient documentation

## 2024-02-22 DIAGNOSIS — Z170 Estrogen receptor positive status [ER+]: Secondary | ICD-10-CM | POA: Diagnosis not present

## 2024-02-22 LAB — POCT URINALYSIS DIPSTICK
Bilirubin, UA: NEGATIVE
Glucose, UA: NEGATIVE
Ketones, UA: NEGATIVE
Nitrite, UA: NEGATIVE
Protein, UA: POSITIVE — AB
Spec Grav, UA: 1.02 (ref 1.010–1.025)
Urobilinogen, UA: 0.2 U/dL
pH, UA: 6 (ref 5.0–8.0)

## 2024-02-22 LAB — POCT GLYCOSYLATED HEMOGLOBIN (HGB A1C): Hemoglobin A1C: 7.8 % — AB (ref 4.0–5.6)

## 2024-02-22 MED ORDER — FULVESTRANT 250 MG/5ML IM SOSY
500.0000 mg | PREFILLED_SYRINGE | Freq: Once | INTRAMUSCULAR | Status: AC
  Administered 2024-02-22: 500 mg via INTRAMUSCULAR
  Filled 2024-02-22: qty 10

## 2024-02-22 MED ORDER — RYBELSUS 7 MG PO TABS: 7.0000 mg | ORAL_TABLET | Freq: Every day | ORAL | 1 refills | Status: DC

## 2024-02-22 NOTE — Progress Notes (Signed)
 Established Patient Office Visit  Subjective   Patient ID: Sharon Woods, female    DOB: 1960/08/02  Age: 63 y.o. MRN: [id number]  Chief Complaint  Patient presents with   Medical Management of Chronic Issues    3 month recheck    Headache   Hyperglycemia    Sharon Woods presents to follow-up on chronic medical conditions.   Discussed the use of AI scribe software for clinical note transcription with the patient, who gave verbal consent to proceed.  History of Present Illness  Sharon Woods is a 63 year old female with type 2 diabetes who presents for follow-up on her blood sugar control.  She has been on Rybelsus  3 mg since August, but her hemoglobin A1c remains elevated at 7.8%, unchanged from her last visit. She is confused about her medication regimen and believes she is taking eight pills. She has difficulty avoiding sugary foods because others eat them around her, but she wants better diabetes control.  She has pain only at the end of urination. Prior urine tests did not show urinary tract infection. She has taken tramadol  for this pain.  She has cirrhosis and is concerned about liver health. She is aware Tylenol  may affect her liver. She notes decreased appetite and early satiety, which she attributes to her medication.   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 -Most recent CT scan showing L3 vertebral metastases with unchanged pulmonary nodule and cirrhosis and splenomegaly.  -S/p Double mastectomy in August 2023 -Following with medical oncology -Uncertain if still on Arimidex  but also on Truqap  160 mg and Tramadol  for pain -Family history of mother, father and sister with breast cancer  Diabetes, Type 2: -Last A1c 7.8% 8/25 -Medications: Metformin  500 mg BID, started on Rybelsus  3 mg at LOV but uncertain if she is taking it -Eye exam: UTD -Foot exam: UTD -Microalbumin: UTD -Statin: Yes -PNA vaccine: Due  today -Denies symptoms of hypoglycemia, polyuria, polydipsia, numbness extremities, foot ulcers/trauma.   HLD: -Medications: Crestor  5 mg  -Patient is compliant with medication reports no side effects -MRI of the brain showing small vessel ischemia 6/24 -Last lipid panel: Lipid Panel     Component Value Date/Time   CHOL 177 04/23/2023 1601   TRIG 148 04/23/2023 1601   HDL 62 04/23/2023 1601   CHOLHDL 2.9 04/23/2023 1601   VLDL 47 (H) 04/16/2015 0659   LDLCALC 90 04/23/2023 1601    MDD: -Mood status: stable -Current treatment: Lexapro  20 mg, Hydroxyzine  10 mg daily   -Satisfied with current treatment?: no -Symptom severity: moderate  -Duration of current treatment : months -Side effects: no Medication compliance: excellent compliance -Previous psychiatric medications: prozac , seroquel, and zyprexa  -patient states that she has been on medications for depression in the past, however her husband did not want her to be on them and she felt like a zombie so she discontinued them however she also states that she is under a lot of stress both at home and with her health Depressed mood: yes     02/22/2024    2:54 PM 04/23/2023    3:19 PM 10/20/2022   10:29 AM 07/21/2022   10:34 AM 05/09/2022   12:56 PM  Depression screen PHQ 2/9  Decreased Interest 0 0 0 0 0  Down, Depressed, Hopeless 0 0 0 0 0  PHQ - 2 Score 0 0 0 0 0  Altered sleeping   0 0   Tired, decreased energy  0 0   Change in appetite   0 0   Feeling bad or failure about yourself    0 0   Trouble concentrating   0 0   Moving slowly or fidgety/restless   0 0   Suicidal thoughts   0 0   PHQ-9 Score   0  0    Difficult doing work/chores   Not difficult at all Not difficult at all      Data saved with a previous flowsheet row definition      Patient Active Problem List   Diagnosis Date Noted   Neoplasm related pain 10/21/2023   Diarrhea 10/21/2023   Liver cirrhosis (HCC) 08/26/2023   Ascites 08/26/2023   Mixed  hyperlipidemia 04/23/2023   Moderate episode of recurrent major depressive disorder (HCC) 04/23/2023   Abnormal CT scan 07/25/2022   Adenomatous polyp of colon 07/25/2022   Hypokalemia 06/24/2022   Anxiety 05/25/2022   Leukopenia 03/20/2022   Blood in stool 03/13/2022   Abnormal gastrointestinal PET scan 01/10/2022   Diabetes mellitus (HCC) 12/05/2021   Status post bilateral mastectomy 11/21/2021   Genetic testing 10/07/2021   Vaginitis 09/28/2021   Thrombocytopenia 09/08/2021   Malignant neoplasm of left breast, stage 4 (HCC) 08/28/2021   Malignant neoplasm of lower-outer quadrant of right breast of female, estrogen receptor positive (HCC) 08/28/2021   Goals of care, counseling/discussion 08/28/2021   Severe recurrent major depression with psychotic features (HCC) 11/15/2015   Hypertension 11/15/2015   Noncompliance 11/15/2015   Severe major depression, single episode, with psychotic features, mood-congruent (HCC) 03/29/2015   Protein-calorie malnutrition, severe 03/26/2015   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   Past Medical History:  Diagnosis Date   Anxiety    Cancer (HCC)    Depression    Diabetes mellitus without complication (HCC)    History of high cholesterol 2000   Hyperlipidemia    Hypertension    Patient denies medical problems    Thrombocytopenia    Past Surgical History:  Procedure Laterality Date   ABDOMINAL HYSTERECTOMY     AXILLARY SENTINEL NODE BIOPSY Left 11/15/2021   Procedure: AXILLARY SENTINEL NODE BIOPSY;  Surgeon: Lane Shope, MD;  Location: [facility] ORS;  Service: General;  Laterality: Left;   BREAST BIOPSY Right 08/21/2021   us  bx 7:00 mass coil clip path pending   BREAST BIOPSY Right 08/21/2021   us  bx of LN, hydro marker, path pending   BREAST BIOPSY Left 08/21/2021   us  bx, heart marker, path pending   CESAREAN SECTION     x2   COLONOSCOPY WITH PROPOFOL  N/A 07/25/2022   Procedure: COLONOSCOPY WITH PROPOFOL ;  Surgeon: Therisa Bi, MD;  Location: [facility]  ENDOSCOPY;  Service: Gastroenterology;  Laterality: N/A;   ESOPHAGOGASTRODUODENOSCOPY N/A 11/02/2023   Procedure: EGD (ESOPHAGOGASTRODUODENOSCOPY);  Surgeon: Therisa Bi, MD;  Location: [facility] ENDOSCOPY;  Service: Gastroenterology;  Laterality: N/A;   MASTECTOMY     MASTECTOMY MODIFIED RADICAL Right 11/15/2021   Procedure: MASTECTOMY MODIFIED RADICAL;  Surgeon: Lane Shope, MD;  Location: [facility] ORS;  Service: General;  Laterality: Right;   NO PAST SURGERIES     TOTAL MASTECTOMY Left 11/15/2021   Procedure: TOTAL MASTECTOMY;  Surgeon: Lane Shope, MD;  Location: [facility] ORS;  Service: General;  Laterality: Left;   Social History   Tobacco Use   Smoking status: Former    Current packs/day: 0.00    Types: Cigarettes    Quit date: 2013    Years since quitting: 12.9    Passive exposure: Current  Smokeless tobacco: Never  Vaping Use   Vaping status: Never Used  Substance Use Topics   Alcohol use: Not Currently    Comment: occasionally   Drug use: No   Social History   Socioeconomic History   Marital status: Married    Spouse name: Not on file   Number of children: Not on file   Years of education: Not on file   Highest education level: Not on file  Occupational History   Not on file  Tobacco Use   Smoking status: Former    Current packs/day: 0.00    Types: Cigarettes    Quit date: 2013    Years since quitting: 12.9    Passive exposure: Current   Smokeless tobacco: Never  Vaping Use   Vaping status: Never Used  Substance and Sexual Activity   Alcohol use: Not Currently    Comment: occasionally   Drug use: No   Sexual activity: Not Currently    Comment: unable to assess   Other Topics Concern   Not on file  Social History Narrative   Not on file   Social Drivers of Health   Financial Resource Strain: Medium Risk (10/05/2023)   Received [facility]   Overall Financial Resource Strain (CARDIA)    Difficulty of Paying Living Expenses:  Somewhat hard  Food Insecurity: No Food Insecurity (10/05/2023)   Received from [facility]   Hunger Vital Sign    Within the past 12 months, you worried that your food would run out before you got the money to buy more.: Never true    Within the past 12 months, the food you bought just didn't last and you didn't have money to get more.: Never true  Transportation Needs: No Transportation Needs (10/05/2023)   Received from [facility] - Transportation    In the past 12 months, has lack of transportation kept you from medical appointments or from getting medications?: No    Lack of Transportation (Non-Medical): No  Physical Activity: Inactive (10/10/2021)   Exercise Vital Sign    Days of Exercise per Week: 0 days    Minutes of Exercise per Session: 0 min  Stress: Stress Concern Present (10/10/2021)   [employer] - Occupational Stress Questionnaire    Feeling of Stress : To some extent  Social Connections: Socially Isolated (10/10/2021)   Social Connection and Isolation Panel    Frequency of Communication with Friends and Family: Once a week    Frequency of Social Gatherings with Friends and Family: Never    Attends Religious Services: Never    Database Administrator or Organizations: No    Attends Engineer, Structural: Never    Marital Status: Married  Catering Manager Violence: Not on file   Family Status  Relation Name Status   Mother  Deceased   Father  Deceased   Sister  Deceased   Mat Uncle x2 Deceased   MGM  Deceased   Other  (Not Specified)   Other  (Not Specified)  No partnership data on file   Family History  Problem Relation Age of Onset   Lung cancer Mother    Dementia Mother    Parkinson's disease Father    Cancer Father        unk type   Diabetes Sister    Heart attack Sister    Breast cancer Sister    Cancer Maternal Uncle  unk types   Dementia Maternal Grandmother     Cancer Maternal Grandmother        unk type   Cancer Other    Dementia Other    Allergies  Allergen Reactions   Tylenol  [Acetaminophen ]     Per pt due to cirrhosis of the liver   Aleve [Naproxen] Rash      Review of Systems  Genitourinary:  Positive for dysuria.      Objective:     BP 126/72 (Cuff Size: Normal)   Pulse 80   Temp 98.2 F (36.8 C) (Oral)   Resp 16   Ht 5' 4 (1.626 m)   Wt 172 lb 6.4 oz (78.2 kg)   LMP  (LMP Unknown) Comment: Patient is not a good historian  SpO2 98%   BMI 29.59 kg/m  BP Readings from Last 3 Encounters:  02/22/24 126/72  01/22/24 (!) 160/76  12/10/23 109/69   Wt Readings from Last 3 Encounters:  02/22/24 172 lb 6.4 oz (78.2 kg)  01/22/24 172 lb 3.2 oz (78.1 kg)  12/10/23 163 lb 1.6 oz (74 kg)      Physical Exam Constitutional:      Appearance: Normal appearance.  HENT:     Head: Normocephalic and atraumatic.  Eyes:     Conjunctiva/sclera: Conjunctivae normal.  Cardiovascular:     Rate and Rhythm: Normal rate and regular rhythm.     Pulses:          Dorsalis pedis pulses are 2+ on the right side and 2+ on the left side.  Pulmonary:     Effort: Pulmonary effort is normal.     Breath sounds: Normal breath sounds.  Musculoskeletal:     Right foot: Normal range of motion. No deformity, bunion, Charcot foot, foot drop or prominent metatarsal heads.     Left foot: Normal range of motion. No deformity, bunion, Charcot foot, foot drop or prominent metatarsal heads.  Feet:     Right foot:     Protective Sensation: 6 sites tested.  6 sites sensed.     Skin integrity: Dry skin present.     Toenail Condition: Right toenails are normal.     Left foot:     Protective Sensation: 6 sites tested.  6 sites sensed.     Skin integrity: Dry skin present.     Toenail Condition: Left toenails are normal.  Skin:    General: Skin is warm and dry.  Neurological:     General: No focal deficit present.     Mental Status: She is alert.  Mental status is at baseline.  Psychiatric:        Mood and Affect: Mood normal.        Speech: Speech is tangential.        Behavior: Behavior is slowed.      Results for orders placed or performed in visit on 02/22/24  POCT HgB A1C  Result Value Ref Range   Hemoglobin A1C 7.8 (A) 4.0 - 5.6 %   HbA1c POC (<> result, manual entry)     HbA1c, POC (prediabetic range)     HbA1c, POC (controlled diabetic range)       Last CBC Lab Results  Component Value Date   WBC 3.3 (L) 01/22/2024   HGB 9.2 (L) 01/22/2024   HCT 28.0 (L) 01/22/2024   MCV 85.4 01/22/2024   MCH 28.0 01/22/2024   RDW 15.3 01/22/2024   PLT 57 (L) 01/22/2024   Last metabolic  panel Lab Results  Component Value Date   GLUCOSE 199 (H) 01/22/2024   NA 137 01/22/2024   K 4.2 01/22/2024   CL 104 01/22/2024   CO2 25 01/22/2024   BUN 13 01/22/2024   CREATININE 0.62 01/22/2024   GFRNONAA >60 01/22/2024   CALCIUM  9.1 01/22/2024   PHOS 2.1 (L) 03/26/2015   PROT 6.2 (L) 01/22/2024   ALBUMIN 3.4 (L) 01/22/2024   BILITOT 0.7 01/22/2024   ALKPHOS 68 01/22/2024   AST 29 01/22/2024   ALT 18 01/22/2024   ANIONGAP 8 01/22/2024   Last lipids Lab Results  Component Value Date   CHOL 177 04/23/2023   HDL 62 04/23/2023   LDLCALC 90 04/23/2023   TRIG 148 04/23/2023   CHOLHDL 2.9 04/23/2023   Last hemoglobin A1c Lab Results  Component Value Date   HGBA1C 7.8 (A) 02/22/2024   Last thyroid functions Lab Results  Component Value Date   TSH 0.767 03/25/2015   Last vitamin D No results found for: 25OHVITD2, 25OHVITD3, VD25OH Last vitamin B12 and Folate Lab Results  Component Value Date   VITAMINB12 361 08/14/2022   FOLATE 22.0 05/05/2022      The 10-year ASCVD risk score (Arnett DK, et al., 2019) is: 6.5%    Assessment & Plan:   Assessment & Plan  Type 2 diabetes mellitus, poorly controlled Type 2 diabetes mellitus poorly controlled with A1c of 7.8%. Current treatment includes Rybelsus  3 mg.  Adherence and dosage may need adjustment. Reports difficulty managing diet and social situations related to diabetes. - Increased Rybelsus  dosage - Arranged for pharmacist to review medications and ensure adherence. - Instructed to bring all medications to next appointment for review.  Urinary symptoms, under evaluation Reports urinary symptoms including pain at the end of urination. Previous urine tests have not indicated a UTI, but symptoms persist. - Performed urine test to evaluate for possible infection. UA with leukocytes and blood, no nitrates, sent for culture.  Immunization management Received flu vaccine today. Discussed pneumonia vaccine to prevent severe outcomes from pneumonia, especially given her health conditions. - Administered pneumonia vaccine today.  - POCT HgB A1C - Semaglutide  (RYBELSUS ) 7 MG TABS; Take 1 tablet (7 mg total) by mouth daily.  Dispense: 90 tablet; Refill: 1 - AMB Referral VBCI Care Management - Flu vaccine trivalent PF, 6mos and older(Flulaval,Afluria,Fluarix,Fluzone) - POCT Urinalysis Dipstick - Urine Culture - Pneumococcal conjugate vaccine 20-valent (Prevnar 20)   Return in about 3 months (around 05/24/2024).    Sharyle Fischer, DO

## 2024-02-23 LAB — URINE CULTURE
MICRO NUMBER:: 17277680
Result:: NO GROWTH
SPECIMEN QUALITY:: ADEQUATE

## 2024-02-24 ENCOUNTER — Ambulatory Visit: Payer: Self-pay | Admitting: Internal Medicine

## 2024-03-01 ENCOUNTER — Ambulatory Visit (INDEPENDENT_AMBULATORY_CARE_PROVIDER_SITE_OTHER): Payer: MEDICAID

## 2024-03-01 DIAGNOSIS — Z7984 Long term (current) use of oral hypoglycemic drugs: Secondary | ICD-10-CM | POA: Diagnosis not present

## 2024-03-01 DIAGNOSIS — E1169 Type 2 diabetes mellitus with other specified complication: Secondary | ICD-10-CM | POA: Diagnosis not present

## 2024-03-01 MED ORDER — BLOOD GLUCOSE TEST VI STRP: ORAL_STRIP | 5 refills | Status: AC

## 2024-03-01 MED ORDER — BLOOD GLUCOSE MONITORING SUPPL DEVI: 0 refills | Status: AC

## 2024-03-01 MED ORDER — RYBELSUS 14 MG PO TABS: 14.0000 mg | ORAL_TABLET | Freq: Every day | ORAL | 1 refills | Status: AC

## 2024-03-01 MED ORDER — LANCETS MISC: 5 refills | Status: AC

## 2024-03-01 MED ORDER — LANCET DEVICE MISC: 0 refills | Status: AC

## 2024-03-01 NOTE — Progress Notes (Signed)
 S:     Reason for visit: ?  Sharon Woods is a 63 y.o. female with a history of diabetes (type 2), who presents today for an initial diabetes Face to Face pharmacotherapy visit.? Pertinent PMH also includes liver cirrhosis, depression, stage 4 breast cancer, anxiety.  Care Team: Primary Care Provider: Bernardo Fend, DO  At last visit with PCP on 02/22/24, Rybelsus  dose was increased to 7 mg daily. There was a bit of a concern for possible non-adherence at that time.  Patient presented to clinic today with husband and medications.   Current diabetes medications include: metformin  500 mg BID, Rybelsus  7 mg daily Current hyperlipidemia medications include: rosuvastatin  5 mg daily  Patient reports adherence to taking all medications as prescribed. Husband fills her pill box and will call her during the day while he is at work to remind her to take her medications.   Have you been experiencing any side effects to the medications prescribed? Yes - some GI upset, although it is likely this could be from her oncological agents Do you have any problems obtaining medications due to transportation or finances? no Insurance coverage: [facility] Medicaid  Patient denies hypoglycemic events.  Reported 2 hour post-meal/random blood sugars: 234 mg/dL (only one reading provided)  Patient denies nocturia (nighttime urination).  Patient denies neuropathy (nerve pain). Patient denies self foot exams.   Patient reported dietary habits: Eats 1-2 meals/day Reports she will typically eat one meal and then smaller snacks throughout the day Snacks: mixed nuts, protein shake (Ensure) Drinks: diet drinks  DM Prevention:  Statin: Taking; moderate intensity.?  ACE/ARB: no;  Last urinary albumin/creatinine ratio:  Lab Results  Component Value Date   MICRALBCREAT 15 04/23/2023   MICRALBCREAT 13 01/20/2022   Last eye exam:  Lab Results  Component Value Date   HMDIABEYEEXA No Retinopathy  08/15/2022   Lab Results  Component Value Date   HMDIABEYEEXA No Retinopathy 08/15/2022   Last foot exam: 04/23/2023 Tobacco Use:  Tobacco Use: Medium Risk (02/22/2024)   Patient History    Smoking Tobacco Use: Former    Smokeless Tobacco Use: Never    Passive Exposure: Current   O:  Vitals:  Wt Readings from Last 3 Encounters:  02/22/24 172 lb 6.4 oz (78.2 kg)  01/22/24 172 lb 3.2 oz (78.1 kg)  12/10/23 163 lb 1.6 oz (74 kg)   BP Readings from Last 3 Encounters:  02/22/24 126/72  01/22/24 (!) 160/76  12/10/23 109/69   Pulse Readings from Last 3 Encounters:  02/22/24 80  01/22/24 70  12/10/23 79     Labs:?  Lab Results  Component Value Date   HGBA1C 7.8 (A) 02/22/2024   HGBA1C 7.8 (A) 11/20/2023   HGBA1C 6.6 (A) 07/23/2023   GLUCOSE 199 (H) 01/22/2024   MICRALBCREAT 15 04/23/2023   MICRALBCREAT 13 01/20/2022   CREATININE 0.62 01/22/2024   CREATININE 1.05 (H) 12/10/2023   CREATININE 1.03 (H) 11/26/2023    Lab Results  Component Value Date   CHOL 177 04/23/2023   LDLCALC 90 04/23/2023   LDLCALC 134 (H) 07/21/2022   LDLCALC 139 (H) 04/16/2015   HDL 62 04/23/2023   TRIG 148 04/23/2023   TRIG 202 (H) 07/21/2022   TRIG 236 (H) 04/16/2015   ALT 18 01/22/2024   ALT 11 12/10/2023   AST 29 01/22/2024   AST 23 12/10/2023      Chemistry      Component Value Date/Time   NA 137 01/22/2024 0950  NA 141 10/07/2013 1844   K 4.2 01/22/2024 0950   K 3.2 (L) 10/07/2013 1844   CL 104 01/22/2024 0950   CL 105 10/07/2013 1844   CO2 25 01/22/2024 0950   CO2 29 10/07/2013 1844   BUN 13 01/22/2024 0950   BUN 9 10/07/2013 1844   CREATININE 0.62 01/22/2024 0950   CREATININE 0.74 04/23/2023 1601      Component Value Date/Time   CALCIUM  9.1 01/22/2024 0950   CALCIUM  8.7 10/07/2013 1844   ALKPHOS 68 01/22/2024 0950   ALKPHOS 67 10/07/2013 1844   AST 29 01/22/2024 0950   ALT 18 01/22/2024 0950   ALT 17 10/07/2013 1844   BILITOT 0.7 01/22/2024 0950       The  10-year ASCVD risk score (Arnett DK, et al., 2019) is: 6.5%  Lab Results  Component Value Date   MICRALBCREAT 15 04/23/2023   MICRALBCREAT 13 01/20/2022    A/P: Diabetes currently uncontrolled with a most recent A1c of 7.8% on 02/22/24. Patient is able to verbalize appropriate hypoglycemia management plan. Medication adherence appears appropriate. Unable to fully assess control d/t lack of BG readings. GLP1 dose was increased last week - will increase dose after completion of current medication bottle.  -Increased dose of GLP-1 Rybelsus  (semaglutide ) to 14 mg daily after completion of current prescription bottle (in ~3 weeks) -Continued metformin  500 mg BID.  -Extensively discussed pathophysiology of diabetes, recommended lifestyle interventions, dietary effects on blood sugar control. Counseled on low-carb protein shakes (Glucerna) -Counseled on s/sx of and management of hypoglycemia.  -Next A1c anticipated 05/2024.  -Sent in prescription for BG monitoring supplies. Encouraged monitoring FBG 2-3x per week  ASCVD risk - primary prevention in patient with diabetes. Last LDL is 90 mg/dL, not at goal of <29 mg/dL. May discuss increasing to high-intensity statin at follow up.  -Continued rosuvastatin  5 mg daily.   Patient verbalized understanding of treatment plan. Total time patient counseling 45 minutes.  Follow-up:  Pharmacist on 05/24/24 PCP clinic visit on 04/04/24  Peyton CHARLENA Ferries, PharmD, CPP Clinical Pharmacist [facility] [phone]

## 2024-03-01 NOTE — Patient Instructions (Addendum)
 Thanks for visiting with me today!  Increase Rybelsus  to 14 mg daily after you finish your current bottle. Continue metformin  500 mg twice daily Begin monitoring your blood sugar 2-3 mornings each week.  Let me know if you have any questions! Janaysia Mcleroy E. Marsh, PharmD, CPP Clinical Pharmacist [facility] [phone]

## 2024-03-14 ENCOUNTER — Other Ambulatory Visit: Payer: Self-pay | Admitting: Oncology

## 2024-03-14 ENCOUNTER — Other Ambulatory Visit: Payer: Self-pay

## 2024-03-14 DIAGNOSIS — C50912 Malignant neoplasm of unspecified site of left female breast: Secondary | ICD-10-CM

## 2024-03-14 MED ORDER — CAPIVASERTIB 160 MG PO TBPK
320.0000 mg | ORAL_TABLET | Freq: Two times a day (BID) | ORAL | 0 refills | Status: DC
  Filled 2024-03-14: qty 64, 28d supply, fill #0

## 2024-03-16 ENCOUNTER — Other Ambulatory Visit: Payer: Self-pay | Admitting: Pharmacy Technician

## 2024-03-16 ENCOUNTER — Other Ambulatory Visit: Payer: Self-pay

## 2024-03-16 NOTE — Progress Notes (Signed)
 Specialty Pharmacy Refill Coordination Note  Sharon Woods is a 63 y.o. female contacted today regarding refills of specialty medication(s) Capivasertib  (TRUQAP )  Spoke with Husband  Patient requested Delivery   Delivery date: 03/22/24   Verified address: [address] [place]   Medication will be filled on: 03/21/24

## 2024-03-21 ENCOUNTER — Other Ambulatory Visit: Payer: Self-pay | Admitting: Oncology

## 2024-03-22 ENCOUNTER — Encounter: Payer: Self-pay | Admitting: Oncology

## 2024-03-29 ENCOUNTER — Other Ambulatory Visit: Payer: Self-pay

## 2024-03-29 ENCOUNTER — Inpatient Hospital Stay: Payer: MEDICAID

## 2024-03-29 ENCOUNTER — Inpatient Hospital Stay: Payer: MEDICAID | Attending: Radiation Oncology

## 2024-03-29 ENCOUNTER — Encounter: Payer: Self-pay | Admitting: Oncology

## 2024-03-29 ENCOUNTER — Inpatient Hospital Stay (HOSPITAL_BASED_OUTPATIENT_CLINIC_OR_DEPARTMENT_OTHER): Payer: MEDICAID | Admitting: Oncology

## 2024-03-29 VITALS — BP 129/80 | HR 76 | Temp 97.3°F | Resp 18 | Wt 167.0 lb

## 2024-03-29 DIAGNOSIS — K7460 Unspecified cirrhosis of liver: Secondary | ICD-10-CM

## 2024-03-29 DIAGNOSIS — D696 Thrombocytopenia, unspecified: Secondary | ICD-10-CM | POA: Diagnosis not present

## 2024-03-29 DIAGNOSIS — C50511 Malignant neoplasm of lower-outer quadrant of right female breast: Secondary | ICD-10-CM | POA: Diagnosis not present

## 2024-03-29 DIAGNOSIS — C50812 Malignant neoplasm of overlapping sites of left female breast: Secondary | ICD-10-CM | POA: Diagnosis present

## 2024-03-29 DIAGNOSIS — C50912 Malignant neoplasm of unspecified site of left female breast: Secondary | ICD-10-CM

## 2024-03-29 DIAGNOSIS — R197 Diarrhea, unspecified: Secondary | ICD-10-CM | POA: Diagnosis not present

## 2024-03-29 DIAGNOSIS — Z79899 Other long term (current) drug therapy: Secondary | ICD-10-CM | POA: Diagnosis not present

## 2024-03-29 DIAGNOSIS — R188 Other ascites: Secondary | ICD-10-CM

## 2024-03-29 DIAGNOSIS — Z5111 Encounter for antineoplastic chemotherapy: Secondary | ICD-10-CM | POA: Insufficient documentation

## 2024-03-29 DIAGNOSIS — Z170 Estrogen receptor positive status [ER+]: Secondary | ICD-10-CM

## 2024-03-29 DIAGNOSIS — C7951 Secondary malignant neoplasm of bone: Secondary | ICD-10-CM | POA: Insufficient documentation

## 2024-03-29 LAB — CBC WITH DIFFERENTIAL (CANCER CENTER ONLY)
Abs Immature Granulocytes: 0.03 K/uL (ref 0.00–0.07)
Basophils Absolute: 0 K/uL (ref 0.0–0.1)
Basophils Relative: 1 %
Eosinophils Absolute: 0.1 K/uL (ref 0.0–0.5)
Eosinophils Relative: 1 %
HCT: 33.8 % — ABNORMAL LOW (ref 36.0–46.0)
Hemoglobin: 10.9 g/dL — ABNORMAL LOW (ref 12.0–15.0)
Immature Granulocytes: 1 %
Lymphocytes Relative: 14 %
Lymphs Abs: 0.7 K/uL (ref 0.7–4.0)
MCH: 26.8 pg (ref 26.0–34.0)
MCHC: 32.2 g/dL (ref 30.0–36.0)
MCV: 83.3 fL (ref 80.0–100.0)
Monocytes Absolute: 0.3 K/uL (ref 0.1–1.0)
Monocytes Relative: 7 %
Neutro Abs: 3.7 K/uL (ref 1.7–7.7)
Neutrophils Relative %: 76 %
Platelet Count: 125 K/uL — ABNORMAL LOW (ref 150–400)
RBC: 4.06 MIL/uL (ref 3.87–5.11)
RDW: 15.8 % — ABNORMAL HIGH (ref 11.5–15.5)
WBC Count: 4.9 K/uL (ref 4.0–10.5)
nRBC: 0 % (ref 0.0–0.2)

## 2024-03-29 LAB — CMP (CANCER CENTER ONLY)
ALT: 11 U/L (ref 0–44)
AST: 30 U/L (ref 15–41)
Albumin: 4.1 g/dL (ref 3.5–5.0)
Alkaline Phosphatase: 63 U/L (ref 38–126)
Anion gap: 17 — ABNORMAL HIGH (ref 5–15)
BUN: 22 mg/dL (ref 8–23)
CO2: 22 mmol/L (ref 22–32)
Calcium: 9.9 mg/dL (ref 8.9–10.3)
Chloride: 104 mmol/L (ref 98–111)
Creatinine: 1.08 mg/dL — ABNORMAL HIGH (ref 0.44–1.00)
GFR, Estimated: 58 mL/min — ABNORMAL LOW
Glucose, Bld: 135 mg/dL — ABNORMAL HIGH (ref 70–99)
Potassium: 3.9 mmol/L (ref 3.5–5.1)
Sodium: 143 mmol/L (ref 135–145)
Total Bilirubin: 0.5 mg/dL (ref 0.0–1.2)
Total Protein: 6.9 g/dL (ref 6.5–8.1)

## 2024-03-29 MED ORDER — DIPHENOXYLATE-ATROPINE 2.5-0.025 MG PO TABS: 1.0000 | ORAL_TABLET | Freq: Four times a day (QID) | ORAL | 0 refills | Status: AC | PRN

## 2024-03-29 MED ORDER — FULVESTRANT 250 MG/5ML IM SOSY
500.0000 mg | PREFILLED_SYRINGE | Freq: Once | INTRAMUSCULAR | Status: AC
  Administered 2024-03-29: 500 mg via INTRAMUSCULAR

## 2024-03-29 NOTE — Assessment & Plan Note (Signed)
 Follow up with GI [facility] for EGD varices screening.

## 2024-03-29 NOTE — Progress Notes (Addendum)
 Specialty Pharmacy Ongoing Clinical Assessment Note  I spoke with the patient's husband.  Sharon Woods is a 63 y.o. female who is being followed by the specialty pharmacy service for RxSp Oncology   Patient's specialty medication(s) reviewed today: Capivasertib  (TRUQAP )   Missed doses in the last 4 weeks: 0   Patient/Caregiver did not have any additional questions or concerns.   Therapeutic benefit summary: Patient is achieving benefit   Adverse events/side effects summary: Experienced adverse events/side effects (diarrhea, uncontrolled by Imodium (patient's husband reports that he will discuss with the MD at her appointment today) and stomach cramps, occasional nausea/vomiting (has Zofran  PRN on hand which helps))   Patient's therapy is appropriate to: Continue    Goals Addressed             This Visit's Progress    Slow Disease Progression   On track    Patient is on track. Patient will maintain adherence. Per office visit notes from 01/22/24, the repeat CT scan showed stable disease.           Follow up: 3 months  Silvano LOISE Dolly Specialty Pharmacist

## 2024-03-29 NOTE — Assessment & Plan Note (Signed)
 See above plan.

## 2024-03-29 NOTE — Assessment & Plan Note (Signed)
 Due to splenomegaly

## 2024-03-29 NOTE — Assessment & Plan Note (Addendum)
 Bilateral breast invasive lobular carcinoma, s/p bilateral mastectomy. [name]   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 CT-guided right cervical lymphadenopathy pathology showed metastatic poorly differentiated carcinoma, with morphology consistent with breast primary.  NGS PIK3CA, CDH1, TMB 13.2 pMMR,.  ER +90%/PR - 0% /HER2- (0)  Recommend to continue monthly fulvestrant  injection. She tolerates Capivasertib  320mg  BID D1-4 every 7 days.  Continue current regimen.  Repeat CT scan showed stable disease. Plan to repeat PET scan in 6 to 8 weeks.

## 2024-03-29 NOTE — Assessment & Plan Note (Signed)
 Chronic issue for her She developed diarrhea before start of Capivasertib  Acute on chronic symptoms. IV fluid hydration 1 L of normal saline. Recommend patient to switch from Imodium to Lomotil 4 times daily as needed.  She knows not to combine both medication.  Check C. difficile and GI panel

## 2024-03-29 NOTE — Assessment & Plan Note (Signed)
 Improved distention.  Follow-up with gastroenterology

## 2024-03-29 NOTE — Progress Notes (Signed)
 Pt here for follow up. Reports that she has been having dry mouth and headaches. She has diarrhea due to Truqup and would like something stronger for it.

## 2024-03-29 NOTE — Progress Notes (Signed)
 "   Hematology/Oncology Progress note [phone] [phone]       CHIEF COMPLAINTS/REASON FOR VISIT:  bilateral breast cancer, thrombocytopenia, leukopenia  ASSESSMENT & PLAN:   Cancer Staging  Malignant neoplasm of left breast, stage 4 (HCC) Staging form: Breast, AJCC 8th Edition - Pathologic stage from 11/25/2021: Stage IB (pT2, pN1, cM0, G2, ER+, PR+, HER2-) - Signed by Babara Call, Sharon Woods on 11/25/2021 - Pathologic stage from 07/25/2022: Stage IV (rpTX, rpNX, rpM1, ER+, PR: Not Assessed, HER2-) - Signed by Babara Call, Sharon Woods on 08/14/2022  Malignant neoplasm of lower-outer quadrant of right breast of female, estrogen receptor positive (HCC) Staging form: Breast, AJCC 8th Edition - Pathologic stage from 11/25/2021: Stage IIIA (pT1c, pN3a, cM0, G2, ER+, PR+, HER2-) - Signed by Babara Call, Sharon Woods on 11/25/2021   Malignant neoplasm of left breast, 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 [name]   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 CT-guided right cervical lymphadenopathy pathology showed metastatic poorly differentiated carcinoma, with morphology consistent with  breast primary.  NGS PIK3CA, CDH1, TMB 13.2 pMMR,.  ER +90%/PR - 0% /HER2- (0)  Recommend to continue monthly fulvestrant  injection. She tolerates Capivasertib  320mg  BID D1-4 every 7 days.  Continue current regimen.  Repeat CT scan showed stable disease. Plan to repeat PET scan in 6 to 8 weeks.     Malignant neoplasm of lower-outer quadrant of right breast of female, estrogen receptor positive (HCC) See above plan.  Ascites Improved distention.  Follow-up with gastroenterology  Diarrhea Chronic issue for her She developed diarrhea before start of Capivasertib  Acute on chronic symptoms. IV fluid hydration 1 L of normal saline. Recommend patient to switch from Imodium to Lomotil 4 times daily as needed.  She knows not to combine both medication.  Check C. difficile and GI panel  Liver cirrhosis (HCC) Follow up with GI Uptodate for EGD varices screening.   Thrombocytopenia Due to splenomegaly.        Plan was discussed with patient and her husband  Orders Placed This Encounter  Procedures   C difficile quick screen w PCR reflex    Standing Status:   Future    Expected Date:   03/29/2024    Expiration Date:   06/27/2024   Gastrointestinal Panel by PCR , Stool    Standing Status:   Future    Expected Date:   03/29/2024    Expiration Date:   06/27/2024   NM PET Image Restag (PS) Skull Base To Thigh    Standing Status:   Future    Expected Date:   05/10/2024    Expiration Date:   03/29/2025    If indicated for the ordered procedure, I authorize the administration of a radiopharmaceutical per Radiology protocol:   Yes    Preferred imaging location?:   [facility]  CMP (Cancer Center only)    Standing Status:   Future    Expected Date:   05/24/2024    Expiration Date:   08/22/2024   CBC with Differential (Cancer Center Only)    Standing Status:   Future    Expected Date:   05/24/2024    Expiration Date:   08/22/2024   Cancer antigen 27.29    Standing Status:   Future     Expected Date:   05/24/2024    Expiration Date:   08/22/2024   Cancer antigen 15-3    Standing Status:   Future    Expected Date:   05/24/2024    Expiration Date:   08/22/2024   CMP (Cancer Center only)    Standing Status:   Future    Expected Date:   04/26/2024    Expiration Date:   07/25/2024   CBC with Differential (Cancer Center Only)    Standing Status:   Future    Expected Date:   04/26/2024    Expiration Date:   07/25/2024   Cancer antigen 27.29    Standing Status:   Future    Expected Date:   04/26/2024    Expiration Date:   07/25/2024   Cancer antigen 15-3    Standing Status:   Future    Expected Date:   04/26/2024    Expiration Date:   07/25/2024   Follow up per LOS [name], questions or concerns.  Sharon Cap, Sharon Woods, Sharon Woods [facility] Hematology Oncology 03/29/2024     HISTORY OF PRESENTING ILLNESS:   is a  63 y.o.  female presents for follow up of bilateral breast cancer, thrombocytopenia and leukopenia.  Oncology History  Malignant neoplasm of left breast, stage 4 (HCC)  08/06/2021 Mammogram   08/06/2021, digital bilateral mammogram and ultrasound showed left breast 3:00 mass, 1.7 x 1.7 x 1.7 cm, ultrasound of the left axillary is negative. 08/21/2021 right breast 1 x 0.7 x 0.8 cm angulated spiculated mass at the right breast 7:00, 5 cm from nipple.  Ultrasound of the right axillary demonstrates 3 abnormal thickened cortex lymph node. 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 Lael Hines right breast 7:00, invasive mammary carcinoma with lobular features, in situ carcinoma,  ER+, PR+, HER 2-This was found to be concordant by Dr. Lael Hines. right axilla lymph node +, extracapsular extension.  -This was found to be concordant by Dr. Lael Hines   08/28/2021 Initial Diagnosis   Malignant neoplasm of upper-outer quadrant of left breast in female, estrogen receptor positive (HCC)   08/28/2021 Imaging   CT CHEST, ABDOMEN, AND PELVIS WITH CONTRAST Asymmetric mildly enlarged right axillary nodes. Correlate with biopsy. There are some punctate foci of sclerosis in the included spine that could reflect subtle metastatic disease. Nonobstructing bilateral renal calculi.   08/29/2021 Imaging   BILATERAL BREAST MRI WITH AND WITHOUT CONTRAST 1. 2.5 centimeter enhancing mass in the LOWER OUTER QUADRANT of the RIGHT breast, correlating with known malignancy. 2. At least 4 enlarged RIGHT axillary lymph nodes, correlating well with recently biopsied lymph node showing metastatic disease. 3. 3.4 centimeter enhancing mass in the LEFT breast, with an additional 2.9 centimeters of non mass enhancement posterior to the mass also suspicious for malignancy. This likely correlates  with the area calcifications seen mammographically. 4. 5 millimeters satellite nodule along the LATERAL aspect of known malignancy in the LOWER OUTER QUADRANT of the LEFT breast. 5. LEFT axilla is negative for adenopathy.       09/05/2021 Imaging   NUCLEAR MEDICINE WHOLE BODY BONE SCAN Uptake at L2 which is nonspecific but may be related to advanced degenerative disc and facet disease changes at both L1-L2 and L2-L3; no evidence of osseous metastatic disease by CT. No definite osseous metastatic lesions identified.    Genetic Testing   Negative genetic testing. No pathogenic variants identified on the [facility] CancerNext-Expanded+RNA panel. The report date is 10/03/2021.  The CancerNext-Expanded + RNAinsight gene panel offered by [employer] and includes sequencing and rearrangement analysis for the following 77 genes: IP, ALK, APC*, ATM*, AXIN2, BAP1, BARD1, BLM, BMPR1A, BRCA1*, BRCA2*, BRIP1*, CDC73, CDH1*,CDK4, CDKN1B, CDKN2A, CHEK2*, CTNNA1, DICER1, FANCC, FH, FLCN,  GALNT12, KIF1B, LZTR1, MAX, MEN1, MET, MLH1*, MSH2*, MSH3, MSH6*, MUTYH*, NBN, NF1*, NF2, NTHL1, PALB2*, PHOX2B, PMS2*, POT1, PRKAR1A, PTCH1, PTEN*, RAD51C*, RAD51D*,RB1, RECQL, RET, SDHA, SDHAF2, SDHB, SDHC, SDHD, SMAD4, SMARCA4, SMARCB1, SMARCE1, STK11, SUFU, TMEM127, TP53*,TSC1, TSC2, VHL and XRCC2 (sequencing and deletion/duplication); EGFR, EGLN1, HOXB13, KIT, MITF, PDGFRA, POLD1 and POLE (sequencing only); EPCAM and GREM1 (deletion/duplication only).   11/15/2021 Surgery   She underwent left simple mastectomy and right modified mastectomy.   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  pT2 pN1a, ER +90%, PR +90%, HER2 negative (1+)  # Right Breast invasive lobular carcinoma Grade 2, negative margins, right axillary lymph nodes  30/32 involved with metastatic carcinoma, pT1c pN3a ER +90%, PR +51-90%, HER2 negative (1+)    11/25/2021 Cancer Staging   Staging form: Breast, AJCC 8th Edition - Pathologic stage from 11/25/2021: Stage IB (pT2, pN1, cM0, G2, ER+, PR+, HER2-) - Signed by Babara Call, Sharon Woods on 11/25/2021 Stage prefix: Initial diagnosis Multigene prognostic tests performed: None Histologic grading system: 3 grade system   01/09/2022 - 03/10/2022 Radiation Therapy   Adjuvant breast Radiation.    07/25/2022 Cancer Staging   Staging form: Breast, AJCC 8th Edition - Pathologic stage from 07/25/2022: Stage IV (rpTX, pNX, pM1, ER+, PR: Not Assessed, HER2-) - Signed by Babara Call, Sharon Woods on 08/14/2022 Stage prefix: Recurrence Multigene prognostic tests performed: None   07/25/2022 Procedure   Colonoscopy showed Findings - Six 5 to 6 mm polyps in the ascending colon, removed with a cold snare. Resected and retrieved. - One 10 mm polyp in the descending colon, removed with a cold snare.  Resected and retrieved. Clip was placed. - One 30 mm polyp in the rectum, removed with a hot snare. Resected and retrieved. Clips were placed.  Pathology showed A.  COLON POLYP X 6, ASCENDING AND CECUM; COLD SNARE: - COLONIC MUCOSA WITH METASTATIC LOBULAR BREAST CARCINOMA (3) - HYPERPLASTIC POLYP (3)  B.  COLON POLYP, DESCENDING; COLD SNARE: - TUBULAR ADENOMA. - NEGATIVE FOR HIGH-GRADE DYSPLASIA AND MALIGNANCY.  C.  RECTUM POLYP; HOT SNARE: - TUBULOVILLOUS ADENOMA. - NEGATIVE FOR HIGH-GRADE DYSPLASIA AND MALIGNANCY.  Comment: The metastatic lobular breast carcinoma is positive for GATA-3 and ER (strong staining in greater than 90% of the tumor cells). HER2 negative (1+)     08/06/2022 Imaging   PET  Status post bilateral mastectomy with radiation changes in the anterior upper lobes.   No findings suspicious for  recurrent or metastatic disease   09/18/2022 Imaging   MRI brain w wo  1. No evidence of intracranial metastatic disease. 2. Mild multifocal T2 FLAIR hyperintense signal abnormality within the cerebral white matter, nonspecific but most often secondary to chronic small vessel ischemia. 3. Paranasal sinus disease as described.   01/27/2023 Imaging   US  left axillary No sonographic evidence of malignancy at the site of palpable concern in the LEFT axilla.    04/22/2023 Imaging   CT chest abdomen pelvis w contrast showed 1. New right chest wall soft tissue lesion which may represent locally recurrent or metastatic disease. Attention on follow-up. 2. New right seventh rib sclerotic osseous lesion. 3. Splenomegaly.    05/11/2023 Procedure   CT guided biopsy of right axillary nodule   BENIGN FIBROVASCULAR TISSUE.       NO LYMPHOID TISSUE IDENTIFIED.       NEGATIVE FOR MALIGNANCY.    08/19/2023 Imaging   PET scan showed 1. 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 2. Small but hypermetabolic right  level IIA, right level IIb, right level V, and bilateral inguinal lymph nodes, suspicious for metastatic disease. 3. Progressive perihepatic ascites, splenomegaly, and findings of portal venous hypertension including upstream varices adjacent to the distal esophagus and collateral vessels/varices in the gastrohepatic ligament. Progressive edema along the gastrohepatic ligament, peripancreatic region, and retroperitoneum. 4. Nonobstructive bilateral nephrolithiasis. 5.  Aortic Atherosclerosis (ICD10-I70.0).     09/11/2023 Procedure   1. Lymph node, needle/core biopsy, right neck :      -  METASTATIC POORLY DIFFERENTIATED CARCINOMA MORPHOLOGIC CONSISTENT WITH THE      CLINICAL HISTORY OF BREAST PRIMARY (HISTORY OF INVASIVE LOBULAR CARCINOMA OF      BILATERAL BREAST).      NOTE: CLINICAL HISTORY OF STAGE IV BREAST CARCINOMA (INVASIVE LOBULAR OF BOTH      LEFT AND RIGHT) IS NOTED.  THE FINDINGS ARE MORPHOLOGICALLY CONSISTENT WITH      METASTATIC BREAST CARCINOMA.  NOTE CONFIRMATORY IMMUNOHISTOCHEMICAL STAINS ARE PERFORMED GIVEN THE MORPHOLOGIC IMPRESSION AND TO PRESERVE TISSUE FOR CLINICIAN  DIRECTED TESTING.    10/08/2023 -  Chemotherapy   start Capivasertib     01/08/2024 Imaging   CT chest abdomen pelvis w contrast showed  1. Status post bilateral mastectomy. 2. Similar CT appearance of lytic, previously FDG avid osseous metastases, including of the anterior aspect of the L3 vertebral body and the left posterior acetabulum. 3. No enlarged lymph nodes in the chest, abdomen, or pelvis. Previously seen FDG avid inguinal lymph nodes are unchanged in appearance and not pathologically enlarged by CT. 4. Occasional very tiny pulmonary nodules unchanged, most likely benign and incidental. Attention on follow-up. 5. Cirrhosis and splenomegaly. Small volume ascites throughout the abdomen pelvis, similar to prior examination. Small gastroesophageal varices. 6. Mucosal thickening and  hyperenhancement of the distal gastric body, antrum, and pylorus, suggesting nonspecific gastritis. 7. Nonobstructive left nephrolithiasis.   Malignant neoplasm of lower-outer quadrant of right breast of female, estrogen receptor positive (HCC)  08/06/2021 Mammogram   08/06/2021, digital bilateral mammogram and ultrasound showed left breast 3:00 mass, 1.7 x 1.7 x 1.7 cm, ultrasound of the left axillary is negative. 08/21/2021 right breast 1 x 0.7 x 0.8 cm angulated spiculated mass at the right breast 7:00, 5 cm from nipple.  Ultrasound of the right axillary demonstrates 3 abnormal thickened cortex lymph node. Patient was recommended to proceed with biopsy left breast 3:00 invasive mammry carcinoma with lobular features. in  situ carcinoma, lobular neoplasia. 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 Lael Hines right breast 7:00, invasive mammary carcinoma with lobular features, in situ carcinoma,  ER+, PR+, HER 2-This was found to be concordant by Dr. Lael Hines. right axilla lymph node +, extracapsular extension.  -This was found to be concordant by Dr. Lael Hines   08/28/2021 Initial Diagnosis   Malignant neoplasm of lower-outer quadrant of right breast of female, estrogen receptor positive (HCC)   08/28/2021 Imaging   CT CHEST, ABDOMEN, AND PELVIS WITH CONTRAST Asymmetric mildly enlarged right axillary nodes. Correlate with biopsy. There are some punctate foci of sclerosis in the included spine that could reflect subtle metastatic disease. Nonobstructing bilateral renal calculi.   08/29/2021 Imaging   BILATERAL BREAST MRI WITH AND WITHOUT CONTRAST 1. 2.5 centimeter enhancing mass in the LOWER OUTER QUADRANT of the RIGHT breast, correlating with known malignancy. 2. At least 4 enlarged RIGHT axillary lymph nodes, correlating well with recently biopsied lymph node showing metastatic disease. 3. 3.4 centimeter enhancing mass in the LEFT breast, with an additional 2.9  centimeters of non mass enhancement posterior to the mass also suspicious for malignancy. This likely correlates with the area calcifications seen mammographically. 4. 5 millimeters satellite nodule along the LATERAL aspect of known malignancy in the LOWER OUTER QUADRANT of the LEFT breast. 5. LEFT axilla is negative for adenopathy.       09/05/2021 Imaging   NUCLEAR MEDICINE WHOLE BODY BONE SCAN Uptake at L2 which is nonspecific but may be related to advanced degenerative disc and facet disease changes at both L1-L2 and L2-L3; no evidence of osseous metastatic disease by CT. No definite osseous metastatic lesions identified.   09/09/2021 Oncotype testing   [id number] block RIGHT 7:00 5 CM FN; ULTRASOUND-GUIDED BIOPSY Oncotype Dx RS score 22    Genetic Testing   Negative genetic testing. No pathogenic variants identified on the [facility] CancerNext-Expanded+RNA panel. The report date is 10/03/2021.  The CancerNext-Expanded + RNAinsight gene panel offered by [employer] and includes sequencing and rearrangement analysis for the following 77 genes: IP, ALK, APC*, ATM*, AXIN2, BAP1, BARD1, BLM, BMPR1A, BRCA1*, BRCA2*, BRIP1*, CDC73, CDH1*,CDK4, CDKN1B, CDKN2A, CHEK2*, CTNNA1, DICER1, FANCC, FH, FLCN, GALNT12, KIF1B, LZTR1, MAX, MEN1, MET, MLH1*, MSH2*, MSH3, MSH6*, MUTYH*, NBN, NF1*, NF2, NTHL1, PALB2*, PHOX2B, PMS2*, POT1, PRKAR1A, PTCH1, PTEN*, RAD51C*, RAD51D*,RB1, RECQL, RET, SDHA, SDHAF2, SDHB, SDHC, SDHD, SMAD4, SMARCA4, SMARCB1, SMARCE1, STK11, SUFU, TMEM127, TP53*,TSC1, TSC2, VHL and XRCC2 (sequencing and deletion/duplication); EGFR, EGLN1, HOXB13, KIT, MITF, PDGFRA, POLD1 and POLE (sequencing only); EPCAM and GREM1 (deletion/duplication only).   11/15/2021 Surgery   She underwent left simple mastectomy and right modified mastectomy.   Pathology # Left breast, invasive lobular carcinoma Grade 2, with back ground neoplasia [LCIS and atypical lobular hyperplasia], benign intraductal  papilloma, negative margins, Left axillary SLNB 2/2 involved with metastatic carcinoma, extranodal extension.  pT2 pN1a, ER +90%, PR +90%, HER2 negative (1+)  # Right Breast invasive lobular carcinoma Grade 2, negative margins, right axillary lymph nodes  30/32 involved with metastatic carcinoma, pT1c pN3a ER +90%, PR +51-90%, HER2 negative (1+)    11/25/2021 Cancer Staging   Staging form: Breast, AJCC 8th Edition - Pathologic stage from 11/25/2021: Stage IIIA (pT1c, pN3a, cM0, G2, ER+, PR+, HER2-) - Signed by Babara Call, Sharon Woods on 11/25/2021 Stage prefix: Initial diagnosis Multigene prognostic tests performed: None Histologic grading system: 3 grade system   12/10/2021 Imaging   PET scan  1. Interval bilateral  mastectomy and right axillary node dissection.No evidence of residual disease in the chest wall, nodal metastases or distant metastases. 2. Focal hypermetabolic activity in the proximal rectum corresponding with an intraluminal polypoid lesion, suspicious for a villous adenoma or early colon cancer. Sigmoidoscopy/colonoscopy recommended unless recently performed   12/10/2021 Imaging   1. Interval bilateral mastectomy and right axillary node dissection.No evidence of residual disease in the chest wall, nodal metastasesor distant metastases. 2. Focal hypermetabolic activity in the proximal rectum corresponding with an intraluminal polypoid lesion, suspicious for a villous adenoma or early colon cancer. Sigmoidoscopy/colonoscopy recommended unless recently performed    She is a poor historian. History of major depression, psychosis, previously on olanzapine  and Remeron  not currently on any and if not currently following up with psychiatrist. Patient's family history is positive for sister with breast cancer S/p EGD in August 2025   INTERVAL HISTORY Sharon Woods is a 63 y.o. female who has above history reviewed by me today presents for follow up visit for management of bilateral breast  cancer She denies pain today  She is accompanied by her husband. + diarrhea, multiple times a day.  Patient tried Imodium with no improvement.  She reports feeling tired and dehydrated.  Dry mouth and headache. She takes Capivasertib , otherwise tolerates well.  Abdominal distention, stable, slightly improved. She continues to have bedbug infestation at home.   Review of Systems  Constitutional:  Negative for appetite change, chills, fatigue and fever.  HENT:   Negative for hearing loss and voice change.   Eyes:  Negative for eye problems.  Respiratory:  Negative for chest tightness and cough.   Cardiovascular:  Negative for chest pain.  Gastrointestinal:  Positive for diarrhea. Negative for abdominal distention, abdominal pain and blood in stool.  Endocrine: Negative for hot flashes.  Genitourinary:  Negative for difficulty urinating and frequency.   Musculoskeletal:  Negative for arthralgias.  Skin:  Negative for itching and rash.  Neurological:  Negative for extremity weakness.  Hematological:  Negative for adenopathy.  Psychiatric/Behavioral:  Negative for confusion.     MEDICAL HISTORY:  Past Medical History:  Diagnosis Date   Anxiety    Cancer (HCC)    Depression    Diabetes mellitus without complication (HCC)    History of high cholesterol 2000   Hyperlipidemia    Hypertension    Patient denies medical problems    Thrombocytopenia     SURGICAL HISTORY: Past Surgical History:  Procedure Laterality Date   ABDOMINAL HYSTERECTOMY     AXILLARY SENTINEL NODE BIOPSY Left 11/15/2021   Procedure: AXILLARY SENTINEL NODE BIOPSY;  Surgeon: Lane Shope, Sharon Woods;  Location: [facility] ORS;  Service: General;  Laterality: Left;   BREAST BIOPSY Right 08/21/2021   us  bx 7:00 mass coil clip path pending   BREAST BIOPSY Right 08/21/2021   us  bx of LN, hydro marker, path pending   BREAST BIOPSY Left 08/21/2021   us  bx, heart marker, path pending   CESAREAN SECTION     x2   COLONOSCOPY  WITH PROPOFOL  N/A 07/25/2022   Procedure: COLONOSCOPY WITH PROPOFOL ;  Surgeon: Therisa Bi, Sharon Woods;  Location: [facility] ENDOSCOPY;  Service: Gastroenterology;  Laterality: N/A;   ESOPHAGOGASTRODUODENOSCOPY N/A 11/02/2023   Procedure: EGD (ESOPHAGOGASTRODUODENOSCOPY);  Surgeon: Therisa Bi, Sharon Woods;  Location: [facility] ENDOSCOPY;  Service: Gastroenterology;  Laterality: N/A;   MASTECTOMY     MASTECTOMY MODIFIED RADICAL Right 11/15/2021   Procedure: MASTECTOMY MODIFIED RADICAL;  Surgeon: Lane Shope, Sharon Woods;  Location: [facility] ORS;  Service: General;  Laterality: Right;   NO PAST SURGERIES     TOTAL MASTECTOMY Left 11/15/2021   Procedure: TOTAL MASTECTOMY;  Surgeon: Lane Shope, Sharon Woods;  Location: [facility] ORS;  Service: General;  Laterality: Left;    SOCIAL HISTORY: Social History   Socioeconomic History   Marital status: Married    Spouse name: Not on file   Number of children: Not on file   Years of education: Not on file   Highest education level: Not on file  Occupational History   Not on file  Tobacco Use   Smoking status: Former    Current packs/day: 0.00    Types: Cigarettes    Quit date: 2013    Years since quitting: 13.0    Passive exposure: Current   Smokeless tobacco: Never  Vaping Use   Vaping status: Never Used  Substance and Sexual Activity   Alcohol use: Not Currently    Comment: occasionally   Drug use: No   Sexual activity: Not Currently    Comment: unable to assess   Other Topics Concern   Not on file  Social History Narrative   Not on file   Social Drivers of Health   Tobacco Use: Medium Risk (03/29/2024)   Patient History    Smoking Tobacco Use: Former    Smokeless Tobacco Use: Never    Passive Exposure: Current  Physicist, Medical Strain: Medium Risk (10/05/2023)   Received from [facility]   Overall Financial Resource Strain (CARDIA)    Difficulty of Paying Living Expenses: Somewhat hard  Food Insecurity: No Food Insecurity (10/05/2023)   Received  from [facility]   Epic    Within the past 12 months, you worried that your food would run out before you got the money to buy more.: Never true    Within the past 12 months, the food you bought just didn't last and you didn't have money to get more.: Never true  Transportation Needs: No Transportation Needs (10/05/2023)   Received from [facility] - Transportation    In the past 12 months, has lack of transportation kept you from medical appointments or from getting medications?: No    Lack of Transportation (Non-Medical): No  Physical Activity: Inactive (10/10/2021)   Exercise Vital Sign    Days of Exercise per Week: 0 days    Minutes of Exercise per Session: 0 min  Stress: Stress Concern Present (10/10/2021)   [employer] - Occupational Stress Questionnaire    Feeling of Stress : To some extent  Social Connections: Socially Isolated (10/10/2021)   Social Connection and Isolation Panel    Frequency of Communication with Friends and Family: Once a week    Frequency of Social Gatherings with Friends and Family: Never    Attends Religious Services: Never    Database Administrator or Organizations: No    Attends Banker Meetings: Never    Marital Status: Married  Catering Manager Violence: Not on file  Depression (PHQ2-9): Low Risk (02/22/2024)   Depression (PHQ2-9)    PHQ-2 Score: 0  Alcohol Screen: Low Risk (04/23/2023)   Alcohol Screen    Last Alcohol Screening Score (AUDIT): 0  Housing: High Risk (10/05/2023)   Received from [facility]   Epic    In the last 12 months, was there a time when you were not able to pay the mortgage or rent on time?: Yes    In  the past 12 months, how many times have you moved where you were living?: 0    At any time in the past 12 months, were you homeless or living in a shelter (including now)?: No  Utilities: At Risk (10/05/2023)   Received from [facility]   Epic    In the past 12 months has the electric, gas, oil, or water  company threatened to shut off services in your home?: Yes  Health Literacy: Not on file    FAMILY HISTORY: Family History  Problem Relation Age of Onset   Lung cancer Mother    Dementia Mother    Parkinson's disease Father    Cancer Father        unk type   Diabetes Sister    Heart attack Sister    Breast cancer Sister    Cancer Maternal Uncle        unk types   Dementia Maternal Grandmother    Cancer Maternal Grandmother        unk type   Cancer Other    Dementia Other     ALLERGIES:  is allergic to tylenol  [acetaminophen ] and aleve [naproxen].  MEDICATIONS:  Current Outpatient Medications  Medication Sig Dispense Refill   blood glucose meter kit and supplies KIT Dispense based on patient and insurance preference. Use up to four times daily as directed. 1 each 1   Blood Glucose Monitoring Suppl DEVI Dispense based on patient and insurance preference. Use up to four times daily as directed. (FOR ICD-10 E11.9). 1 each 0   calcium  carbonate (OS-CAL) 600 MG TABS tablet Take 2 tablets (1,200 mg total) by mouth daily. 60 tablet 5   capivasertib  (TRUQAP ) 160 MG pack Take 2 tablets (320 mg total) by mouth 2 (two) times daily. Take for 4 days, then hold for 3 days. Repeat every 7 days. 64 tablet 0   diphenoxylate-atropine (LOMOTIL) 2.5-0.025 MG tablet Take 1 tablet by mouth 4 (four) times daily as needed for diarrhea or loose stools. 120 tablet 0   escitalopram  (LEXAPRO ) 20 MG tablet Take 1 tablet (20 mg total) by mouth daily. 90 tablet 1   famotidine  (PEPCID ) 40 MG tablet Take 1 tablet (40 mg total) by mouth daily. 90 tablet 1   Glucose Blood (BLOOD GLUCOSE TEST STRIPS) STRP Dispense based on patient and insurance preference. Use up to four times daily as directed. (FOR ICD-10 E11.9). 100 strip 5   hydrOXYzine  (ATARAX ) 10 MG tablet Take 1 tablet (10 mg total) by mouth 2 (two) times daily.  180 tablet 1   Lancet Device MISC Dispense based on patient and insurance preference. Use up to four times daily as directed. (FOR ICD-10 E11.9). 1 each 0   Lancets MISC Dispense based on patient and insurance preference. Use up to four times daily as directed. (FOR ICD-10 E11.9). 100 each 5   loratadine (CLARITIN) 10 MG tablet Take 10 mg by mouth daily.     metFORMIN  (GLUCOPHAGE ) 500 MG tablet Take 1 tablet (500 mg total) by mouth 2 (two) times daily with a meal. 180 tablet 1   Multiple Vitamin (MULTIVITAMIN ADULT PO) Take 1 tablet by mouth daily.     ondansetron  (ZOFRAN ) 8 MG tablet Take 1 tablet (8 mg total) by mouth every 8 (eight) hours as needed for nausea or vomiting. 20 tablet 2   rosuvastatin  (CRESTOR ) 5 MG tablet Take 1 tablet (5 mg total) by mouth daily. 90 tablet 1   Semaglutide  (RYBELSUS ) 14 MG  TABS Take 1 tablet (14 mg total) by mouth daily. 90 tablet 1   traMADol  (ULTRAM ) 50 MG tablet TAKE 1 TABLET BY MOUTH EVERY 6 HOURS AS NEEDED 60 tablet 0   triamcinolone  ointment (KENALOG ) 0.5 % Apply 1 Application topically 2 (two) times daily. 30 g 1   ZINC  OXIDE, TOPICAL, 10 % CREA Apply 1 each topically daily. 78 g 0   potassium chloride  SA (KLOR-CON  M) 20 MEQ tablet Take 1 tablet (20 mEq total) by mouth daily. (Patient not taking: Reported on 03/29/2024) 3 tablet 0   No current facility-administered medications for this visit.     PHYSICAL EXAMINATION: ECOG PERFORMANCE STATUS: 1 - Symptomatic but completely ambulatory Vitals:   03/29/24 1510  BP: 129/80  Pulse: 76  Resp: 18  Temp: (!) 97.3 F (36.3 C)    Filed Weights   03/29/24 1510  Weight: 167 lb (75.8 kg)      Physical Exam Constitutional:      General: She is not in acute distress. HENT:     Head: Normocephalic and atraumatic.  Eyes:     General: No scleral icterus. Cardiovascular:     Rate and Rhythm: Normal rate.  Pulmonary:     Effort: Pulmonary effort is normal. No respiratory distress.     Breath  sounds: No wheezing.  Abdominal:     General: There is distension.  Musculoskeletal:        General: No deformity. Normal range of motion.     Cervical back: Normal range of motion and neck supple.  Skin:    Coloration: Skin is not jaundiced.     Findings: Lesion present.     Comments: Multiple bug bite sites   Neurological:     Mental Status: She is alert and oriented to person, place, and time. Mental status is at baseline.     LABORATORY DATA:  I have reviewed the data as listed    Latest Ref Rng & Units 03/29/2024    3:08 PM 01/22/2024    9:50 AM 12/10/2023    2:01 PM  CBC  WBC 4.0 - 10.5 K/uL 4.9  3.3  3.3   Hemoglobin 12.0 - 15.0 g/dL 89.0  9.2  9.6   Hematocrit 36.0 - 46.0 % 33.8  28.0  29.3   Platelets 150 - 400 K/uL 125  57  70       Latest Ref Rng & Units 03/29/2024    3:08 PM 01/22/2024    9:50 AM 12/10/2023    2:01 PM  CMP  Glucose 70 - 99 mg/dL 864  800  811   BUN 8 - 23 mg/dL 22  13  19    Creatinine 0.44 - 1.00 mg/dL 8.91  9.37  8.94   Sodium 135 - 145 mmol/L 143  137  138   Potassium 3.5 - 5.1 mmol/L 3.9  4.2  3.2   Chloride 98 - 111 mmol/L 104  104  107   CO2 22 - 32 mmol/L 22  25  21    Calcium  8.9 - 10.3 mg/dL 9.9  9.1  8.8   Total Protein 6.5 - 8.1 g/dL 6.9  6.2  6.4   Total Bilirubin 0.0 - 1.2 mg/dL 0.5  0.7  0.7   Alkaline Phos 38 - 126 U/L 63  68  74   AST 15 - 41 U/L 30  29  23    ALT 0 - 44 U/L 11  18  11      RADIOGRAPHIC STUDIES:  I have personally reviewed the radiological images as listed and agreed with the findings in the report. CT CHEST ABDOMEN PELVIS W CONTRAST Result Date: 01/11/2024 CLINICAL DATA:  Breast cancer * Tracking Code: BO * EXAM: CT CHEST, ABDOMEN, AND PELVIS WITH CONTRAST TECHNIQUE: Multidetector CT imaging of the chest, abdomen and pelvis was performed following the standard protocol during bolus administration of intravenous contrast. RADIATION DOSE REDUCTION: This exam was performed according to the departmental  dose-optimization program which includes automated exposure control, adjustment of the mA and/or kV according to patient size and/or use of iterative reconstruction technique. CONTRAST:  OMNIPAQUE  IOHEXOL  300 MG/ML  SOLN COMPARISON:  PET-CT, 08/19/2023 FINDINGS: CT CHEST FINDINGS Cardiovascular: No significant vascular findings. Normal heart size. No pericardial effusion. Mediastinum/Nodes: No enlarged mediastinal, hilar, or axillary lymph nodes. Thyroid gland, trachea, and esophagus demonstrate no significant findings. Lungs/Pleura: Minimal paraseptal emphysema minimal subpleural radiation fibrosis of the anterior lungs. Occasional very tiny pulmonary nodules unchanged, for example a 0.2 cm nodule of the left apex (series 4, image 22). No pleural effusion or pneumothorax. Musculoskeletal: Bilateral mastectomy.  No acute osseous findings. CT ABDOMEN PELVIS FINDINGS Hepatobiliary: No solid liver abnormality is seen. Coarse contour of the liver. No gallstones, gallbladder wall thickening, or biliary dilatation. Pancreas: Unremarkable. No pancreatic ductal dilatation or surrounding inflammatory changes. Spleen: Splenomegaly, maximum span 15.2 cm. Adrenals/Urinary Tract: Adrenal glands are unremarkable. Nonobstructive calculus of the inferior pole of the left kidney. No right-sided calculi, ureteral calculi, or hydronephrosis. Bladder is unremarkable. Stomach/Bowel: Mucosal thickening and hyperenhancement of the distal gastric body, antrum, and pylorus (series 2, image 61). Appendix appears normal. No evidence of bowel wall thickening, distention, or inflammatory changes. Vascular/Lymphatic: Aortic atherosclerosis. Small gastroesophageal varices. No enlarged abdominal or pelvic lymph nodes. Reproductive: Hysterectomy. Other: No abdominal wall hernia or abnormality. Small volume ascites throughout the abdomen pelvis, similar to prior examination. Musculoskeletal: No acute osseous findings. Similar CT appearance of  lytic, previously FDG avid osseous metastases, including of the anterior aspect of the L3 vertebral body (series 2, image 70) and the left posterior acetabulum (series 2, image 109). IMPRESSION: 1. Status post bilateral mastectomy. 2. Similar CT appearance of lytic, previously FDG avid osseous metastases, including of the anterior aspect of the L3 vertebral body and the left posterior acetabulum. 3. No enlarged lymph nodes in the chest, abdomen, or pelvis. Previously seen FDG avid inguinal lymph nodes are unchanged in appearance and not pathologically enlarged by CT. 4. Occasional very tiny pulmonary nodules unchanged, most likely benign and incidental. Attention on follow-up. 5. Cirrhosis and splenomegaly. Small volume ascites throughout the abdomen pelvis, similar to prior examination. Small gastroesophageal varices. 6. Mucosal thickening and hyperenhancement of the distal gastric body, antrum, and pylorus, suggesting nonspecific gastritis. 7. Nonobstructive left nephrolithiasis. Aortic Atherosclerosis (ICD10-I70.0) and Emphysema (ICD10-J43.9). Electronically Signed   By: Marolyn JONETTA Jaksch M.D.   On: 01/11/2024 07:02    "

## 2024-03-30 LAB — CANCER ANTIGEN 15-3: CA 15-3: 638 U/mL — ABNORMAL HIGH (ref 0.0–25.0)

## 2024-03-30 LAB — CANCER ANTIGEN 27.29: CA 27.29: 763.2 U/mL — ABNORMAL HIGH (ref 0.0–38.6)

## 2024-04-01 ENCOUNTER — Inpatient Hospital Stay: Payer: MEDICAID | Attending: Radiation Oncology

## 2024-04-01 VITALS — BP 129/75 | HR 88 | Temp 98.8°F | Resp 18

## 2024-04-01 DIAGNOSIS — Z5111 Encounter for antineoplastic chemotherapy: Secondary | ICD-10-CM | POA: Diagnosis present

## 2024-04-01 DIAGNOSIS — C50812 Malignant neoplasm of overlapping sites of left female breast: Secondary | ICD-10-CM | POA: Insufficient documentation

## 2024-04-01 DIAGNOSIS — R197 Diarrhea, unspecified: Secondary | ICD-10-CM

## 2024-04-01 DIAGNOSIS — C50511 Malignant neoplasm of lower-outer quadrant of right female breast: Secondary | ICD-10-CM | POA: Diagnosis present

## 2024-04-01 DIAGNOSIS — Z170 Estrogen receptor positive status [ER+]: Secondary | ICD-10-CM | POA: Diagnosis not present

## 2024-04-01 MED ORDER — SODIUM CHLORIDE 0.9 % IV SOLN
Freq: Once | INTRAVENOUS | Status: AC
  Filled 2024-04-01: qty 250

## 2024-04-04 ENCOUNTER — Ambulatory Visit: Payer: MEDICAID

## 2024-04-04 DIAGNOSIS — E1169 Type 2 diabetes mellitus with other specified complication: Secondary | ICD-10-CM | POA: Diagnosis not present

## 2024-04-04 DIAGNOSIS — Z7984 Long term (current) use of oral hypoglycemic drugs: Secondary | ICD-10-CM | POA: Diagnosis not present

## 2024-04-04 MED ORDER — ROSUVASTATIN CALCIUM 10 MG PO TABS: 10.0000 mg | ORAL_TABLET | Freq: Every day | ORAL | 3 refills | Status: AC

## 2024-04-04 NOTE — Progress Notes (Addendum)
 "  S:     Reason for visit: ?  Sharon Woods is a 64 y.o. female with a history of diabetes (type 2), who presents today for an initial diabetes Face to Face pharmacotherapy visit.? Pertinent PMH also includes liver cirrhosis, depression, stage 4 breast cancer, anxiety.  They were referred to the pharmacist by their PCP for assistance in managing diabetes.   Care Team: Primary Care Provider: Bernardo Fend, DO  At last visit with clinical pharmacist on 03/01/24, patient was instructed to increase Rybelsus  to 14 mg daily in 3 weeks.  At last visit with oncology on 03/29/24, she reported chronic diarrhea multiple times per day. Patient was switched from Imodium to Lomotil .  Today, she continues to report issues with diarrhea and nausea. She continues to take Rybelsus  7 mg daily  Patient presented to clinic today with husband.  Current diabetes medications include: metformin  500 mg BID, Rybelsus  7 mg daily Current hyperlipidemia medications include: rosuvastatin  5 mg daily  Patient reports adherence to taking all medications as prescribed. Husband fills her pill box and will call her during the day while he is at work to remind her to take her medications.   Have you been experiencing any side effects to the medications prescribed? Yes - some GI upset, although it is likely this could be from her oncological agents Do you have any problems obtaining medications due to transportation or finances? no Insurance coverage: [facility] Medicaid  Patient denies hypoglycemic events.  Reported fasting blood sugars: 130-150 mg/dL  Patient reported dietary habits: Eats 1-2 meals/day Reports she will typically eat one meal and then smaller snacks throughout the day Snacks: mixed nuts, protein shake (Glucerna) Drinks: diet drinks  DM Prevention:  Statin: Taking; moderate intensity.?  ACE/ARB: no;  Last urinary albumin/creatinine ratio:  Lab Results  Component Value Date    MICRALBCREAT 15 04/23/2023   MICRALBCREAT 13 01/20/2022   Last eye exam:  Lab Results  Component Value Date   HMDIABEYEEXA No Retinopathy 08/15/2022   Lab Results  Component Value Date   HMDIABEYEEXA No Retinopathy 08/15/2022   Last foot exam: 04/23/2023 Tobacco Use:  Tobacco Use: Medium Risk (03/29/2024)   Patient History    Smoking Tobacco Use: Former    Smokeless Tobacco Use: Never    Passive Exposure: Current   O:  Vitals:  Wt Readings from Last 3 Encounters:  03/29/24 167 lb (75.8 kg)  02/22/24 172 lb 6.4 oz (78.2 kg)  01/22/24 172 lb 3.2 oz (78.1 kg)   BP Readings from Last 3 Encounters:  04/01/24 129/75  03/29/24 129/80  02/22/24 126/72   Pulse Readings from Last 3 Encounters:  04/01/24 88  03/29/24 76  02/22/24 80     Labs:?  Lab Results  Component Value Date   HGBA1C 7.8 (A) 02/22/2024   HGBA1C 7.8 (A) 11/20/2023   HGBA1C 6.6 (A) 07/23/2023   GLUCOSE 135 (H) 03/29/2024   MICRALBCREAT 15 04/23/2023   MICRALBCREAT 13 01/20/2022   CREATININE 1.08 (H) 03/29/2024   CREATININE 0.62 01/22/2024   CREATININE 1.05 (H) 12/10/2023    Lab Results  Component Value Date   CHOL 177 04/23/2023   LDLCALC 90 04/23/2023   LDLCALC 134 (H) 07/21/2022   LDLCALC 139 (H) 04/16/2015   HDL 62 04/23/2023   TRIG 148 04/23/2023   TRIG 202 (H) 07/21/2022   TRIG 236 (H) 04/16/2015   ALT 11 03/29/2024   ALT 18 01/22/2024   AST 30 03/29/2024   AST  29 01/22/2024      Chemistry      Component Value Date/Time   NA 143 03/29/2024 1508   NA 141 10/07/2013 1844   K 3.9 03/29/2024 1508   K 3.2 (L) 10/07/2013 1844   CL 104 03/29/2024 1508   CL 105 10/07/2013 1844   CO2 22 03/29/2024 1508   CO2 29 10/07/2013 1844   BUN 22 03/29/2024 1508   BUN 9 10/07/2013 1844   CREATININE 1.08 (H) 03/29/2024 1508   CREATININE 0.74 04/23/2023 1601      Component Value Date/Time   CALCIUM  9.9 03/29/2024 1508   CALCIUM  8.7 10/07/2013 1844   ALKPHOS 63 03/29/2024 1508   ALKPHOS 67  10/07/2013 1844   AST 30 03/29/2024 1508   ALT 11 03/29/2024 1508   ALT 17 10/07/2013 1844   BILITOT 0.5 03/29/2024 1508       The 10-year ASCVD risk score (Arnett DK, et al., 2019) is: 6.8%  Lab Results  Component Value Date   MICRALBCREAT 15 04/23/2023   MICRALBCREAT 13 01/20/2022    A/P: Diabetes currently uncontrolled with a most recent A1c of 7.8% on 02/22/24. Patient is able to verbalize appropriate hypoglycemia management plan. Medication adherence appears appropriate. Discussed option of switching from IR to ER metformin  to hopefully lessen GI distress; patient would like to think on this. Will increase GLP dose at this time. -Continued metformin  500 mg BID.  -Increase GLP1 Rybelsus  (semaglutide ) to 14 mg daily. Instructed husband if GI distress worsens, can decrease back to 7 mg daily. -Extensively discussed pathophysiology of diabetes, recommended lifestyle interventions, dietary effects on blood sugar control.  -Counseled on s/sx of and management of hypoglycemia.  -Next A1c anticipated 05/2024.  -Sent in prescription for BG monitoring supplies. Encouraged monitoring FBG 2-3x per week  ASCVD risk - primary prevention in patient with diabetes. Last LDL is 90 mg/dL, not at goal of <29 mg/dL. Patient agreeable to increasing statin dose today. -Increased dose of rosuvastatin  10 mg daily.   Patient verbalized understanding of treatment plan. Total time patient counseling 45 minutes.  Follow-up:  Pharmacist on 05/24/24 PCP clinic visit on 05/03/24  Peyton CHARLENA Ferries, PharmD, CPP Clinical Pharmacist [facility] [phone]   "

## 2024-04-11 ENCOUNTER — Other Ambulatory Visit: Payer: Self-pay | Admitting: Oncology

## 2024-04-11 ENCOUNTER — Other Ambulatory Visit: Payer: Self-pay

## 2024-04-11 DIAGNOSIS — C50912 Malignant neoplasm of unspecified site of left female breast: Secondary | ICD-10-CM

## 2024-04-11 MED ORDER — CAPIVASERTIB 160 MG PO TBPK
320.0000 mg | ORAL_TABLET | Freq: Two times a day (BID) | ORAL | 0 refills | Status: AC
  Filled 2024-04-11: qty 64, 16d supply, fill #0
  Filled 2024-04-13 – 2024-04-15 (×3): qty 64, 28d supply, fill #0

## 2024-04-13 ENCOUNTER — Other Ambulatory Visit: Payer: Self-pay

## 2024-04-15 ENCOUNTER — Other Ambulatory Visit: Payer: Self-pay

## 2024-04-15 ENCOUNTER — Other Ambulatory Visit: Payer: Self-pay | Admitting: Pharmacy Technician

## 2024-04-15 NOTE — Progress Notes (Signed)
 Specialty Pharmacy Refill Coordination Note  Sharon Woods is a 64 y.o. female contacted today regarding refills of specialty medication(s) Capivasertib  (TRUQAP )  Spoke with Husband  Patient requested Delivery   Delivery date: 04/19/24   Verified address: [address]  [place]   Medication will be filled on: 04/18/24

## 2024-04-18 ENCOUNTER — Other Ambulatory Visit: Payer: Self-pay

## 2024-04-26 ENCOUNTER — Inpatient Hospital Stay: Payer: MEDICAID

## 2024-04-26 ENCOUNTER — Telehealth: Payer: Self-pay | Admitting: Oncology

## 2024-04-26 NOTE — Telephone Encounter (Signed)
 Pt spouse called to r/s appts due to weather, appts r/s to this Friday and new date/time confirmed

## 2024-04-29 ENCOUNTER — Inpatient Hospital Stay: Payer: MEDICAID

## 2024-04-29 DIAGNOSIS — Z5111 Encounter for antineoplastic chemotherapy: Secondary | ICD-10-CM | POA: Diagnosis not present

## 2024-04-29 DIAGNOSIS — C50912 Malignant neoplasm of unspecified site of left female breast: Secondary | ICD-10-CM

## 2024-04-29 LAB — CMP (CANCER CENTER ONLY)
ALT: 10 U/L (ref 0–44)
AST: 35 U/L (ref 15–41)
Albumin: 4.2 g/dL (ref 3.5–5.0)
Alkaline Phosphatase: 64 U/L (ref 38–126)
Anion gap: 19 — ABNORMAL HIGH (ref 5–15)
BUN: 26 mg/dL — ABNORMAL HIGH (ref 8–23)
CO2: 24 mmol/L (ref 22–32)
Calcium: 11.6 mg/dL — ABNORMAL HIGH (ref 8.9–10.3)
Chloride: 97 mmol/L — ABNORMAL LOW (ref 98–111)
Creatinine: 1.17 mg/dL — ABNORMAL HIGH (ref 0.44–1.00)
GFR, Estimated: 52 mL/min — ABNORMAL LOW
Glucose, Bld: 119 mg/dL — ABNORMAL HIGH (ref 70–99)
Potassium: 2.9 mmol/L — ABNORMAL LOW (ref 3.5–5.1)
Sodium: 140 mmol/L (ref 135–145)
Total Bilirubin: 0.6 mg/dL (ref 0.0–1.2)
Total Protein: 7 g/dL (ref 6.5–8.1)

## 2024-04-29 LAB — CBC WITH DIFFERENTIAL (CANCER CENTER ONLY)
Abs Immature Granulocytes: 0.04 10*3/uL (ref 0.00–0.07)
Basophils Absolute: 0 10*3/uL (ref 0.0–0.1)
Basophils Relative: 1 %
Eosinophils Absolute: 0.1 10*3/uL (ref 0.0–0.5)
Eosinophils Relative: 1 %
HCT: 35.8 % — ABNORMAL LOW (ref 36.0–46.0)
Hemoglobin: 11.7 g/dL — ABNORMAL LOW (ref 12.0–15.0)
Immature Granulocytes: 1 %
Lymphocytes Relative: 14 %
Lymphs Abs: 1 10*3/uL (ref 0.7–4.0)
MCH: 27.1 pg (ref 26.0–34.0)
MCHC: 32.7 g/dL (ref 30.0–36.0)
MCV: 83.1 fL (ref 80.0–100.0)
Monocytes Absolute: 0.6 10*3/uL (ref 0.1–1.0)
Monocytes Relative: 7 %
Neutro Abs: 6 10*3/uL (ref 1.7–7.7)
Neutrophils Relative %: 76 %
Platelet Count: 124 10*3/uL — ABNORMAL LOW (ref 150–400)
RBC: 4.31 MIL/uL (ref 3.87–5.11)
RDW: 17.5 % — ABNORMAL HIGH (ref 11.5–15.5)
WBC Count: 7.7 10*3/uL (ref 4.0–10.5)
nRBC: 0 % (ref 0.0–0.2)

## 2024-04-29 MED ORDER — FULVESTRANT 250 MG/5ML IM SOSY
500.0000 mg | PREFILLED_SYRINGE | Freq: Once | INTRAMUSCULAR | Status: AC
  Administered 2024-04-29: 500 mg via INTRAMUSCULAR
  Filled 2024-04-29: qty 10

## 2024-04-30 LAB — CANCER ANTIGEN 27.29: CA 27.29: 990.5 U/mL — ABNORMAL HIGH (ref 0.0–38.6)

## 2024-04-30 LAB — CANCER ANTIGEN 15-3: CA 15-3: 939 U/mL — ABNORMAL HIGH (ref 0.0–25.0)

## 2024-05-03 ENCOUNTER — Ambulatory Visit: Payer: MEDICAID

## 2024-05-03 ENCOUNTER — Other Ambulatory Visit: Payer: Self-pay

## 2024-05-03 ENCOUNTER — Telehealth: Payer: Self-pay

## 2024-05-03 ENCOUNTER — Other Ambulatory Visit: Payer: Self-pay | Admitting: Oncology

## 2024-05-03 DIAGNOSIS — C50912 Malignant neoplasm of unspecified site of left female breast: Secondary | ICD-10-CM

## 2024-05-03 NOTE — Progress Notes (Unsigned)
 "  S:     Reason for visit: ?  Sharon Woods is a 64 y.o. female with a history of diabetes (type 2), who presents today for a follow up diabetes Face to Face pharmacotherapy visit.? Pertinent PMH also includes liver cirrhosis, depression, stage 4 breast cancer, anxiety.  They were referred to the pharmacist by their PCP for assistance in managing diabetes.   Care Team: Primary Care Provider: Bernardo Fend, DO  At last visit with clinical pharmacist on 04/04/24, reported fasting BG readings range from 130-150 mg/dL. Patient was instructed to increase Rybelsus  to 14 mg daily. Rosuvastatin  was increased from 5 mg to 10 mg daily at that time.   Today, she presented to clinic with her husband. She continues to report issues with diarrhea and nausea. ***   Current diabetes medications include: metformin  500 mg BID, Rybelsus  14 mg daily Current hyperlipidemia medications include: rosuvastatin  10 mg daily  Patient reports adherence to taking all medications as prescribed. Husband fills her pill box and will call her during the day while he is at work to remind her to take her medications.   Have you been experiencing any side effects to the medications prescribed? Yes - some GI upset, although it is likely this could be from her oncological agents Do you have any problems obtaining medications due to transportation or finances? no Insurance coverage: [facility] Medicaid  Patient denies hypoglycemic events.  Reported fasting blood sugars: *** mg/dL  Patient reported dietary habits: Eats 1-2 meals/day Reports she will typically eat one meal and then smaller snacks throughout the day Snacks: mixed nuts, protein shake (Glucerna) Drinks: diet drinks  DM Prevention:  Statin: Taking; moderate intensity.?  ACE/ARB: no;  Last urinary albumin/creatinine ratio:  Lab Results  Component Value Date   MICRALBCREAT 15 04/23/2023   MICRALBCREAT 13 01/20/2022   Last eye exam:  Lab  Results  Component Value Date   HMDIABEYEEXA No Retinopathy 08/15/2022   Lab Results  Component Value Date   HMDIABEYEEXA No Retinopathy 08/15/2022   Last foot exam: 04/23/2023 Tobacco Use:  Tobacco Use: Medium Risk (03/29/2024)   Patient History    Smoking Tobacco Use: Former    Smokeless Tobacco Use: Never    Passive Exposure: Current   O:  Vitals:  Wt Readings from Last 3 Encounters:  03/29/24 167 lb (75.8 kg)  02/22/24 172 lb 6.4 oz (78.2 kg)  01/22/24 172 lb 3.2 oz (78.1 kg)   BP Readings from Last 3 Encounters:  04/01/24 129/75  03/29/24 129/80  02/22/24 126/72   Pulse Readings from Last 3 Encounters:  04/01/24 88  03/29/24 76  02/22/24 80     Labs:?  Lab Results  Component Value Date   HGBA1C 7.8 (A) 02/22/2024   HGBA1C 7.8 (A) 11/20/2023   HGBA1C 6.6 (A) 07/23/2023   GLUCOSE 119 (H) 04/29/2024   MICRALBCREAT 15 04/23/2023   MICRALBCREAT 13 01/20/2022   CREATININE 1.17 (H) 04/29/2024   CREATININE 1.08 (H) 03/29/2024   CREATININE 0.62 01/22/2024    Lab Results  Component Value Date   CHOL 177 04/23/2023   LDLCALC 90 04/23/2023   LDLCALC 134 (H) 07/21/2022   LDLCALC 139 (H) 04/16/2015   HDL 62 04/23/2023   TRIG 148 04/23/2023   TRIG 202 (H) 07/21/2022   TRIG 236 (H) 04/16/2015   ALT 10 04/29/2024   ALT 11 03/29/2024   AST 35 04/29/2024   AST 30 03/29/2024      Chemistry  Component Value Date/Time   NA 140 04/29/2024 1517   NA 141 10/07/2013 1844   K 2.9 (L) 04/29/2024 1517   K 3.2 (L) 10/07/2013 1844   CL 97 (L) 04/29/2024 1517   CL 105 10/07/2013 1844   CO2 24 04/29/2024 1517   CO2 29 10/07/2013 1844   BUN 26 (H) 04/29/2024 1517   BUN 9 10/07/2013 1844   CREATININE 1.17 (H) 04/29/2024 1517   CREATININE 0.74 04/23/2023 1601      Component Value Date/Time   CALCIUM  11.6 (H) 04/29/2024 1517   CALCIUM  8.7 10/07/2013 1844   ALKPHOS 64 04/29/2024 1517   ALKPHOS 67 10/07/2013 1844   AST 35 04/29/2024 1517   ALT 10 04/29/2024  1517   ALT 17 10/07/2013 1844   BILITOT 0.6 04/29/2024 1517       The 10-year ASCVD risk score (Arnett DK, et al., 2019) is: 7.6%  Lab Results  Component Value Date   MICRALBCREAT 15 04/23/2023   MICRALBCREAT 13 01/20/2022    A/P: Diabetes currently uncontrolled with a most recent A1c of 7.8% on 02/22/24. Patient is able to verbalize appropriate hypoglycemia management plan. Medication adherence appears appropriate. Discussed option of switching from IR to ER metformin  to hopefully lessen GI distress; patient would like to think on this. Will increase GLP dose at this time. -Continued metformin  500 mg BID.  -Increase GLP1 Rybelsus  (semaglutide ) to 14 mg daily. Instructed husband if GI distress worsens, can decrease back to 7 mg daily. -Extensively discussed pathophysiology of diabetes, recommended lifestyle interventions, dietary effects on blood sugar control.  -Counseled on s/sx of and management of hypoglycemia.  -Next A1c anticipated 05/2024.  -Sent in prescription for BG monitoring supplies. Encouraged monitoring FBG 2-3x per week  ASCVD risk - primary prevention in patient with diabetes. Last LDL is 90 mg/dL, not at goal of <29 mg/dL. Patient agreeable to increasing statin dose today. -Increased dose of rosuvastatin  10 mg daily.   Patient verbalized understanding of treatment plan. Total time patient counseling 45 minutes.  Follow-up:  Pharmacist on 05/24/24 PCP clinic visit on 05/03/24  Sharon Woods, PharmD, CPP Clinical Pharmacist [facility] [phone]   "

## 2024-05-04 ENCOUNTER — Inpatient Hospital Stay
Admission: EM | Admit: 2024-05-04 | Payer: MEDICAID | Source: Home / Self Care | Attending: Internal Medicine | Admitting: Internal Medicine

## 2024-05-04 ENCOUNTER — Other Ambulatory Visit: Payer: Self-pay

## 2024-05-04 ENCOUNTER — Inpatient Hospital Stay: Payer: MEDICAID | Admitting: Nurse Practitioner

## 2024-05-04 ENCOUNTER — Emergency Department: Payer: MEDICAID

## 2024-05-04 ENCOUNTER — Inpatient Hospital Stay: Payer: MEDICAID | Attending: Radiation Oncology

## 2024-05-04 ENCOUNTER — Encounter: Payer: Self-pay | Admitting: Nurse Practitioner

## 2024-05-04 ENCOUNTER — Inpatient Hospital Stay: Payer: MEDICAID

## 2024-05-04 DIAGNOSIS — N1831 Chronic kidney disease, stage 3a: Secondary | ICD-10-CM | POA: Diagnosis not present

## 2024-05-04 DIAGNOSIS — E785 Hyperlipidemia, unspecified: Secondary | ICD-10-CM | POA: Diagnosis present

## 2024-05-04 DIAGNOSIS — R410 Disorientation, unspecified: Secondary | ICD-10-CM | POA: Diagnosis present

## 2024-05-04 DIAGNOSIS — E878 Other disorders of electrolyte and fluid balance, not elsewhere classified: Secondary | ICD-10-CM | POA: Diagnosis not present

## 2024-05-04 DIAGNOSIS — F418 Other specified anxiety disorders: Secondary | ICD-10-CM | POA: Diagnosis not present

## 2024-05-04 DIAGNOSIS — E876 Hypokalemia: Secondary | ICD-10-CM | POA: Diagnosis present

## 2024-05-04 DIAGNOSIS — N3000 Acute cystitis without hematuria: Secondary | ICD-10-CM | POA: Diagnosis not present

## 2024-05-04 DIAGNOSIS — N390 Urinary tract infection, site not specified: Principal | ICD-10-CM | POA: Diagnosis present

## 2024-05-04 DIAGNOSIS — D649 Anemia, unspecified: Secondary | ICD-10-CM | POA: Diagnosis present

## 2024-05-04 DIAGNOSIS — E1129 Type 2 diabetes mellitus with other diabetic kidney complication: Secondary | ICD-10-CM | POA: Diagnosis present

## 2024-05-04 DIAGNOSIS — C50912 Malignant neoplasm of unspecified site of left female breast: Secondary | ICD-10-CM

## 2024-05-04 DIAGNOSIS — D696 Thrombocytopenia, unspecified: Secondary | ICD-10-CM | POA: Diagnosis not present

## 2024-05-04 DIAGNOSIS — K7460 Unspecified cirrhosis of liver: Secondary | ICD-10-CM | POA: Diagnosis present

## 2024-05-04 DIAGNOSIS — N179 Acute kidney failure, unspecified: Secondary | ICD-10-CM

## 2024-05-04 LAB — CMP (CANCER CENTER ONLY)
ALT: 10 U/L (ref 0–44)
AST: 28 U/L (ref 15–41)
Albumin: 4.4 g/dL (ref 3.5–5.0)
Alkaline Phosphatase: 59 U/L (ref 38–126)
Anion gap: 20 — ABNORMAL HIGH (ref 5–15)
BUN: 34 mg/dL — ABNORMAL HIGH (ref 8–23)
CO2: 24 mmol/L (ref 22–32)
Calcium: 12.1 mg/dL — ABNORMAL HIGH (ref 8.9–10.3)
Chloride: 98 mmol/L (ref 98–111)
Creatinine: 1.51 mg/dL — ABNORMAL HIGH (ref 0.44–1.00)
GFR, Estimated: 38 mL/min — ABNORMAL LOW
Glucose, Bld: 138 mg/dL — ABNORMAL HIGH (ref 70–99)
Potassium: 2.7 mmol/L — CL (ref 3.5–5.1)
Sodium: 142 mmol/L (ref 135–145)
Total Bilirubin: 0.5 mg/dL (ref 0.0–1.2)
Total Protein: 7 g/dL (ref 6.5–8.1)

## 2024-05-04 LAB — LIPASE, BLOOD: Lipase: 51 U/L (ref 11–51)

## 2024-05-04 LAB — COMPREHENSIVE METABOLIC PANEL WITH GFR
ALT: 8 U/L (ref 0–44)
AST: 26 U/L (ref 15–41)
Albumin: 4.1 g/dL (ref 3.5–5.0)
Alkaline Phosphatase: 55 U/L (ref 38–126)
Anion gap: 17 — ABNORMAL HIGH (ref 5–15)
BUN: 33 mg/dL — ABNORMAL HIGH (ref 8–23)
CO2: 25 mmol/L (ref 22–32)
Calcium: 11.3 mg/dL — ABNORMAL HIGH (ref 8.9–10.3)
Chloride: 99 mmol/L (ref 98–111)
Creatinine, Ser: 1.33 mg/dL — ABNORMAL HIGH (ref 0.44–1.00)
GFR, Estimated: 45 mL/min — ABNORMAL LOW
Glucose, Bld: 120 mg/dL — ABNORMAL HIGH (ref 70–99)
Potassium: 2.8 mmol/L — ABNORMAL LOW (ref 3.5–5.1)
Sodium: 141 mmol/L (ref 135–145)
Total Bilirubin: 0.5 mg/dL (ref 0.0–1.2)
Total Protein: 6.6 g/dL (ref 6.5–8.1)

## 2024-05-04 LAB — CBC
HCT: 33.7 % — ABNORMAL LOW (ref 36.0–46.0)
Hemoglobin: 10.9 g/dL — ABNORMAL LOW (ref 12.0–15.0)
MCH: 27 pg (ref 26.0–34.0)
MCHC: 32.3 g/dL (ref 30.0–36.0)
MCV: 83.4 fL (ref 80.0–100.0)
Platelets: 101 10*3/uL — ABNORMAL LOW (ref 150–400)
RBC: 4.04 MIL/uL (ref 3.87–5.11)
RDW: 17.9 % — ABNORMAL HIGH (ref 11.5–15.5)
WBC: 5.1 10*3/uL (ref 4.0–10.5)
nRBC: 0 % (ref 0.0–0.2)

## 2024-05-04 LAB — URINALYSIS, ROUTINE W REFLEX MICROSCOPIC
Bilirubin Urine: NEGATIVE
Glucose, UA: NEGATIVE mg/dL
Ketones, ur: 20 mg/dL — AB
Nitrite: NEGATIVE
Protein, ur: 100 mg/dL — AB
Specific Gravity, Urine: 1.021 (ref 1.005–1.030)
WBC, UA: >50 WBC/hpf (ref 0–5)
pH: 5 (ref 5.0–8.0)

## 2024-05-04 LAB — MAGNESIUM: Magnesium: 1.3 mg/dL — ABNORMAL LOW (ref 1.7–2.4)

## 2024-05-04 LAB — PHOSPHORUS: Phosphorus: 1.6 mg/dL — ABNORMAL LOW (ref 2.5–4.6)

## 2024-05-04 LAB — CBG MONITORING, ED: Glucose-Capillary: 78 mg/dL (ref 70–99)

## 2024-05-04 MED ORDER — TRAMADOL HCL 50 MG PO TABS
50.0000 mg | ORAL_TABLET | Freq: Four times a day (QID) | ORAL | Status: AC | PRN
  Administered 2024-05-05 – 2024-05-06 (×4): 50 mg via ORAL
  Filled 2024-05-04 (×4): qty 1

## 2024-05-04 MED ORDER — SODIUM CHLORIDE 0.9 % IV SOLN
1.0000 g | INTRAVENOUS | Status: AC
  Administered 2024-05-05 – 2024-05-06 (×2): 1 g via INTRAVENOUS
  Filled 2024-05-04 (×2): qty 10

## 2024-05-04 MED ORDER — INSULIN ASPART 100 UNIT/ML IJ SOLN
0.0000 [IU] | Freq: Three times a day (TID) | INTRAMUSCULAR | Status: AC
  Administered 2024-05-06 (×2): 1 [IU] via SUBCUTANEOUS
  Filled 2024-05-04 (×2): qty 1

## 2024-05-04 MED ORDER — SODIUM CHLORIDE 0.9 % IV SOLN: INTRAVENOUS | Status: AC

## 2024-05-04 MED ORDER — SODIUM CHLORIDE 0.9 % IV SOLN
Freq: Once | INTRAVENOUS | Status: AC
  Filled 2024-05-04: qty 250

## 2024-05-04 MED ORDER — ONDANSETRON HCL 4 MG/2ML IJ SOLN
4.0000 mg | Freq: Three times a day (TID) | INTRAMUSCULAR | Status: AC | PRN
  Administered 2024-05-05: 4 mg via INTRAVENOUS

## 2024-05-04 MED ORDER — ESCITALOPRAM OXALATE 10 MG PO TABS
20.0000 mg | ORAL_TABLET | Freq: Every day | ORAL | Status: AC
  Administered 2024-05-05 – 2024-05-06 (×2): 20 mg via ORAL
  Filled 2024-05-04 (×2): qty 2

## 2024-05-04 MED ORDER — HYDRALAZINE HCL 20 MG/ML IJ SOLN: 5.0000 mg | INTRAMUSCULAR | Status: AC | PRN

## 2024-05-04 MED ORDER — SODIUM CHLORIDE 0.9 % IV SOLN
1.0000 g | Freq: Once | INTRAVENOUS | Status: AC
  Administered 2024-05-04: 1 g via INTRAVENOUS
  Filled 2024-05-04: qty 10

## 2024-05-04 MED ORDER — POTASSIUM CHLORIDE CRYS ER 20 MEQ PO TBCR
40.0000 meq | EXTENDED_RELEASE_TABLET | Freq: Once | ORAL | Status: AC
  Administered 2024-05-04: 40 meq via ORAL
  Filled 2024-05-04: qty 2

## 2024-05-04 MED ORDER — POTASSIUM CHLORIDE 10 MEQ/100ML IV SOLN
10.0000 meq | INTRAVENOUS | Status: AC
  Administered 2024-05-04: 10 meq via INTRAVENOUS
  Filled 2024-05-04: qty 100

## 2024-05-04 MED ORDER — INSULIN ASPART 100 UNIT/ML IJ SOLN: 0.0000 [IU] | Freq: Every day | INTRAMUSCULAR | Status: AC

## 2024-05-04 MED ORDER — SODIUM CHLORIDE 0.9 % IV BOLUS
1000.0000 mL | Freq: Once | INTRAVENOUS | Status: AC
  Administered 2024-05-04: 1000 mL via INTRAVENOUS

## 2024-05-04 MED ORDER — HYDROXYZINE HCL 10 MG PO TABS: 10.0000 mg | ORAL_TABLET | Freq: Two times a day (BID) | ORAL | Status: AC | PRN

## 2024-05-04 MED ORDER — MAGNESIUM SULFATE 4 GM/100ML IV SOLN
4.0000 g | Freq: Once | INTRAVENOUS | Status: AC
  Administered 2024-05-04: 4 g via INTRAVENOUS
  Filled 2024-05-04: qty 100

## 2024-05-04 MED ORDER — SODIUM CHLORIDE 0.9 % IV SOLN
90.0000 mg | Freq: Once | INTRAVENOUS | Status: AC
  Administered 2024-05-04: 90 mg via INTRAVENOUS
  Filled 2024-05-04: qty 10

## 2024-05-04 MED ORDER — FAMOTIDINE 20 MG PO TABS
40.0000 mg | ORAL_TABLET | Freq: Every day | ORAL | Status: AC
  Administered 2024-05-05 – 2024-05-06 (×2): 40 mg via ORAL
  Filled 2024-05-04 (×2): qty 2

## 2024-05-04 MED ORDER — POTASSIUM CHLORIDE 10 MEQ/100ML IV SOLN
10.0000 meq | INTRAVENOUS | Status: AC
  Administered 2024-05-04 (×4): 10 meq via INTRAVENOUS
  Filled 2024-05-04 (×2): qty 100

## 2024-05-04 MED ORDER — ONDANSETRON HCL 4 MG/2ML IJ SOLN
8.0000 mg | Freq: Once | INTRAMUSCULAR | Status: AC
  Administered 2024-05-04: 8 mg via INTRAVENOUS
  Filled 2024-05-04: qty 4

## 2024-05-04 MED ORDER — CAPIVASERTIB 160 MG PO TBPK: 320.0000 mg | ORAL_TABLET | Freq: Two times a day (BID) | ORAL | Status: DC

## 2024-05-04 NOTE — ED Triage Notes (Signed)
 Pt rambling with flight of ideas but appears to be here from [facility] for low potassium.  Reports her husband will be back later.  ARrived with IV in place.

## 2024-05-04 NOTE — ED Provider Notes (Signed)
 "  [facility] Provider Note    Event Date/Time   First MD Initiated Contact with Patient 05/04/24 [phone]     (approximate)   History   Abnormal Labs   HPI  Sharon Woods is a 64 y.o. female who apparently came from cancer center today because of abnormal lab work.  Patient however cannot give any significant history as to why she is here.  She does state that she came from cancer center but is unsure why she is sent here.  Per telephone note in EMR from yesterday she was at the cancer center today for potassium infusion, labs.      Physical Exam   Triage Vital Signs: ED Triage Vitals [05/04/24 1625]  Encounter Vitals Group     BP 128/74     Girls Systolic BP Percentile      Girls Diastolic BP Percentile      Boys Systolic BP Percentile      Boys Diastolic BP Percentile      Pulse Rate 90     Resp 17     Temp 97.6 F (36.4 C)     Temp Source Oral     SpO2 100 %     Weight      Height      Head Circumference      Peak Flow      Pain Score      Pain Loc      Pain Education      Exclude from Growth Chart     Most recent vital signs: Vitals:   05/04/24 1625  BP: 128/74  Pulse: 90  Resp: 17  Temp: 97.6 F (36.4 C)  SpO2: 100%   General: Awake, alert, not oriented to events CV:  Good peripheral perfusion. Regular rate and rhythm. Resp:  Normal effort. Lungs clear. Abd:  No distention.    ED Results / Procedures / Treatments   Labs (all labs ordered are listed, but only abnormal results are displayed) Labs Reviewed  COMPREHENSIVE METABOLIC PANEL WITH GFR - Abnormal; Notable for the following components:      Result Value   Potassium 2.8 (*)    Glucose, Bld 120 (*)    BUN 33 (*)    Creatinine, Ser 1.33 (*)    Calcium  11.3 (*)    GFR, Estimated 45 (*)    Anion gap 17 (*)    All other components within normal limits  CBC - Abnormal; Notable for the following components:   Hemoglobin 10.9 (*)    HCT 33.7 (*)    RDW 17.9 (*)     Platelets 101 (*)    All other components within normal limits  LIPASE, BLOOD  URINALYSIS, ROUTINE W REFLEX MICROSCOPIC  MAGNESIUM      EKG  I, Guadalupe Eagles, attending physician, personally viewed and interpreted this EKG  EKG Time: 1627 Rate: 84 Rhythm: sinus rhythm Axis: normal Intervals: qtc 458 QRS: narrow ST changes: no st elevation Impression: normal ekg    RADIOLOGY I independently interpreted and visualized the  CT head. My interpretation: No ICH Radiology interpretation:  IMPRESSION:  1. No acute intracranial abnormality.  2. Questionable lucent lesions within the calvarium. Per review of  electronic records, patient has history of breast neoplasm with  osseous metastatic disease.     PROCEDURES:  Critical Care performed: No   MEDICATIONS ORDERED IN ED: Medications  sodium chloride  0.9 % bolus 1,000 mL (1,000 mLs Intravenous New Bag/Given 05/04/24 1722)  IMPRESSION / MDM / ASSESSMENT AND PLAN / ED COURSE  I reviewed the triage vital signs and the nursing notes.                              Differential diagnosis includes, but is not limited to, electrolyte abnormality, ICH, infection  Patient's presentation is most consistent with acute presentation with potential threat to life or bodily function.   Patient presented to the emergency department today from oncology clinic because of concerns for electrolyte abnormalities.  On exam patient also appears to be altered.  Workup here does show hypercalcemia and hypokalemia.  Patient was given IV fluids and potassium.  Urine is consistent with urinary tract infection.  Will start IV antibiotics.  Discussed with Dr. Hilma with the hospitalist service who will evaluate for admission.      FINAL CLINICAL IMPRESSION(S) / ED DIAGNOSES   Final diagnoses:  Urinary tract infection without hematuria, site unspecified  Hypokalemia  AKI (acute kidney injury)     Note:  This document was prepared using  Dragon voice recognition software and may include unintentional dictation errors.    Floy Roberts, MD 05/04/24 2250  "

## 2024-05-04 NOTE — Patient Instructions (Signed)
 Magnesium Sulfate Injection What is this medication? MAGNESIUM SULFATE (mag NEE zee um SUL fate) prevents and treats low levels of magnesium in your body. It may also be used to prevent and treat seizures during pregnancy in people with high blood pressure disorders, such as preeclampsia or eclampsia. Magnesium plays an important role in maintaining the health of your muscles and nervous system. This medicine may be used for other purposes; ask your health care provider or pharmacist if you have questions. What should I tell my care team before I take this medication? They need to know if you have any of these conditions: Heart disease History of irregular heart beat Kidney disease An unusual or allergic reaction to magnesium sulfate, medications, foods, dyes, or preservatives Pregnant or trying to get pregnant Breast-feeding How should I use this medication? This medication is for infusion into a vein. It is given in a hospital or clinic setting. Talk to your care team about the use of this medication in children. While this medication may be prescribed for selected conditions, precautions do apply. Overdosage: If you think you have taken too much of this medicine contact a poison control center or emergency room at once. NOTE: This medicine is only for you. Do not share this medicine with others. What if I miss a dose? This does not apply. What may interact with this medication? Certain medications for anxiety or sleep Certain medications for seizures, such phenobarbital Digoxin Medications that relax muscles for surgery Narcotic medications for pain This list may not describe all possible interactions. Give your health care provider a list of all the medicines, herbs, non-prescription drugs, or dietary supplements you use. Also tell them if you smoke, drink alcohol, or use illegal drugs. Some items may interact with your medicine. What should I watch for while using this  medication? Your condition will be monitored carefully while you are receiving this medication. You may need blood work done while you are receiving this medication. What side effects may I notice from receiving this medication? Side effects that you should report to your care team as soon as possible: Allergic reactions--skin rash, itching, hives, swelling of the face, lips, tongue, or throat High magnesium level--confusion, drowsiness, facial flushing, redness, sweating, muscle weakness, fast or irregular heartbeat, trouble breathing Low blood pressure--dizziness, feeling faint or lightheaded, blurry vision Side effects that usually do not require medical attention (report to your care team if they continue or are bothersome): Headache Nausea This list may not describe all possible side effects. Call your doctor for medical advice about side effects. You may report side effects to FDA at 1-800-FDA-1088. Where should I keep my medication? This medication is given in a hospital or clinic and will not be stored at home. NOTE: This sheet is a summary. It may not cover all possible information. If you have questions about this medicine, talk to your doctor, pharmacist, or health care provider.  2024 Elsevier/Gold Standard (2020-11-28 00:00:00)

## 2024-05-04 NOTE — H&P (Incomplete)
 " History and Physical    Sharon Woods FMW:[id number] DOB: June 22, 1960 DOA: 05/04/2024  Referring MD/NP/PA:   PCP: Pcp, No   Patient coming from:  The patient is coming from home.     Chief Complaint:  abnormal lab, confusion, dysuria  HPI: Sharon Woods is a 64 y.o. female with medical history significant of stage IV breast cancer, liver cirrhosis, HTN, HLD, DM, GERD, depression with anxiety, CKD stage IIIa, anemia, thrombocytopenia, who presents with abnormal lab, confusion, dysuria.  Per report, pt is sent to ED from cancer center due to abnormal lab with potassium 2.7 and calcium  12.1. Pt has mild confusion. When I saw pt in ED, she is alert, orientated to person and place, but confused about year to 2025.  She reports mild dysuria and burning on urination.  No urinary frequency.  No chest pain, cough, SOB.  No nausea, vomiting, diarrhea or abdominal pain.  No fever or chills.  Data reviewed independently and ED Course: pt was found to have WBC 5.1, positive UA (hazy appearance, large amount of leukocyte esterase, rare bacteria, WBC> 50), slightly worsening renal function, potassium 2.8, magnesium  1.3, calcium  11.3, phosphorus 1.6.  Temperature normal, blood pressure 138/65, heart rate 98, RR 19, oxygen saturation 100% on room air.  Patient is admitted to telemetry bed as inpatient.  CT of head: 1. No acute intracranial abnormality. 2. Questionable lucent lesions within the calvarium. Per review of electronic records, patient has history of breast neoplasm with osseous metastatic disease.     EKG: I have personally reviewed.  Not done in ED, will get one.   ***   Review of Systems:   General: no fevers, chills, no body weight gain, has poor appetite, has fatigue HEENT: no blurry vision, hearing changes or sore throat Respiratory: no dyspnea, coughing, wheezing CV: no chest pain, no palpitations GI: no nausea, vomiting, abdominal pain, diarrhea, constipation GU: no dysuria,  burning on urination, increased urinary frequency, hematuria  Ext: no leg edema Neuro: no unilateral weakness, numbness, or tingling, no vision change or hearing loss Skin: no rash, no skin tear. MSK: No muscle spasm, no deformity, no limitation of range of movement in spin Heme: No easy bruising.  Travel history: No recent long distant travel.   Allergy: Allergies[1]  Past Medical History:  Diagnosis Date   Anxiety    Cancer (HCC)    Depression    Diabetes mellitus without complication (HCC)    History of high cholesterol 2000   Hyperlipidemia    Hypertension    Patient denies medical problems    Thrombocytopenia     Past Surgical History:  Procedure Laterality Date   ABDOMINAL HYSTERECTOMY     AXILLARY SENTINEL NODE BIOPSY Left 11/15/2021   Procedure: AXILLARY SENTINEL NODE BIOPSY;  Surgeon: Lane Shope, MD;  Location: [facility] ORS;  Service: General;  Laterality: Left;   BREAST BIOPSY Right 08/21/2021   us  bx 7:00 mass coil clip path pending   BREAST BIOPSY Right 08/21/2021   us  bx of LN, hydro marker, path pending   BREAST BIOPSY Left 08/21/2021   us  bx, heart marker, path pending   CESAREAN SECTION     x2   COLONOSCOPY WITH PROPOFOL  N/A 07/25/2022   Procedure: COLONOSCOPY WITH PROPOFOL ;  Surgeon: Therisa Bi, MD;  Location: [facility] ENDOSCOPY;  Service: Gastroenterology;  Laterality: N/A;   ESOPHAGOGASTRODUODENOSCOPY N/A 11/02/2023   Procedure: EGD (ESOPHAGOGASTRODUODENOSCOPY);  Surgeon: Therisa Bi, MD;  Location: [facility] ENDOSCOPY;  Service: Gastroenterology;  Laterality: N/A;  MASTECTOMY     MASTECTOMY MODIFIED RADICAL Right 11/15/2021   Procedure: MASTECTOMY MODIFIED RADICAL;  Surgeon: Lane Shope, MD;  Location: [facility] ORS;  Service: General;  Laterality: Right;   NO PAST SURGERIES     TOTAL MASTECTOMY Left 11/15/2021   Procedure: TOTAL MASTECTOMY;  Surgeon: Lane Shope, MD;  Location: [facility] ORS;  Service: General;  Laterality: Left;     Social History:  reports that she quit smoking about 13 years ago. Her smoking use included cigarettes. She has been exposed to tobacco smoke. She has never used smokeless tobacco. She reports that she does not currently use alcohol. She reports that she does not use drugs.  Family History:  Family History  Problem Relation Age of Onset   Lung cancer Mother    Dementia Mother    Parkinson's disease Father    Cancer Father        unk type   Diabetes Sister    Heart attack Sister    Breast cancer Sister    Cancer Maternal Uncle        unk types   Dementia Maternal Grandmother    Cancer Maternal Grandmother        unk type   Cancer Other    Dementia Other      Prior to Admission medications  Medication Sig Start Date End Date Taking? Authorizing Provider  blood glucose meter kit and supplies KIT Dispense based on patient and insurance preference. Use up to four times daily as directed. 09/27/21   Babara Call, MD  Blood Glucose Monitoring Suppl DEVI Dispense based on patient and insurance preference. Use up to four times daily as directed. (FOR ICD-10 E11.9). 03/01/24   Bernardo Fend, DO  calcium  carbonate (OS-CAL) 600 MG TABS tablet Take 2 tablets (1,200 mg total) by mouth daily. 04/29/22   Babara Call, MD  capivasertib  (TRUQAP ) 160 MG pack Take 2 tablets (320 mg total) by mouth 2 (two) times daily. Take for 4 days, then hold for 3 days. Repeat every 7 days. 04/11/24   Babara Call, MD  diphenoxylate -atropine  (LOMOTIL ) 2.5-0.025 MG tablet Take 1 tablet by mouth 4 (four) times daily as needed for diarrhea or loose stools. 03/29/24   Babara Call, MD  escitalopram  (LEXAPRO ) 20 MG tablet Take 1 tablet (20 mg total) by mouth daily. 11/20/23   Bernardo Fend, DO  famotidine  (PEPCID ) 40 MG tablet Take 1 tablet (40 mg total) by mouth daily. 11/20/23   Bernardo Fend, DO  Glucose Blood (BLOOD GLUCOSE TEST STRIPS) STRP Dispense based on patient and insurance preference. Use up to four  times daily as directed. (FOR ICD-10 E11.9). 03/01/24   Bernardo Fend, DO  hydrOXYzine  (ATARAX ) 10 MG tablet Take 1 tablet (10 mg total) by mouth 2 (two) times daily. 01/08/24   Bernardo Fend, DO  Lancet Device MISC Dispense based on patient and insurance preference. Use up to four times daily as directed. (FOR ICD-10 E11.9). 03/01/24   Bernardo Fend, DO  Lancets MISC Dispense based on patient and insurance preference. Use up to four times daily as directed. (FOR ICD-10 E11.9). 03/01/24   Bernardo Fend, DO  loratadine (CLARITIN) 10 MG tablet Take 10 mg by mouth daily.    [provider]  metFORMIN  (GLUCOPHAGE ) 500 MG tablet Take 1 tablet (500 mg total) by mouth 2 (two) times daily with a meal. 11/20/23   Bernardo Fend, DO  Multiple Vitamin (MULTIVITAMIN ADULT PO) Take 1 tablet by mouth daily.    [provider]  ondansetron  (ZOFRAN ) 8 MG tablet Take 1 tablet (8 mg total) by mouth every 8 (eight) hours as needed for nausea or vomiting. 10/06/23   Leonard, Alyson N, RPH-CPP  potassium chloride  SA (KLOR-CON  M) 20 MEQ tablet Take 1 tablet (20 mEq total) by mouth daily. Patient not taking: Reported on 05/04/2024 12/10/23   Babara Call, MD  rosuvastatin  (CRESTOR ) 10 MG tablet Take 1 tablet (10 mg total) by mouth daily. 04/04/24   Bernardo Fend, DO  Semaglutide  (RYBELSUS ) 14 MG TABS Take 1 tablet (14 mg total) by mouth daily. 03/01/24   Bernardo Fend, DO  traMADol  (ULTRAM ) 50 MG tablet TAKE 1 TABLET BY MOUTH EVERY 6 HOURS AS NEEDED 05/03/24   Babara Call, MD  triamcinolone  ointment (KENALOG ) 0.5 % Apply 1 Application topically 2 (two) times daily. 09/02/22   Babara Call, MD  ZINC  OXIDE, TOPICAL, 10 % CREA Apply 1 each topically daily. 12/10/23   Bernardo Fend, DO    Physical Exam: Vitals:   05/04/24 1625 05/04/24 1800  BP: 128/74 138/65  Pulse: 90 98  Resp: 17 19  Temp: 97.6 F (36.4 C)   TempSrc: Oral   SpO2: 100% 100%   General: Not in acute distress HEENT:        Eyes: PERRL, EOMI, no jaundice       ENT: No discharge from the ears and nose, no pharynx injection, no tonsillar enlargement.        Neck: No JVD, no bruit, no mass felt. Heme: No neck lymph node enlargement. Cardiac: S1/S2, RRR, No murmurs, No gallops or rubs. Respiratory: No rales, wheezing, rhonchi or rubs. GI: Soft, nondistended, nontender, no rebound pain, no organomegaly, BS present. GU: No hematuria Ext: No pitting leg edema bilaterally. 1+DP/PT pulse bilaterally. Musculoskeletal: No joint deformities, No joint redness or warmth, no limitation of ROM in spin. Skin: No rashes.  Neuro: Alert, oriented X3, cranial nerves II-XII grossly intact, moves all extremities normally. Muscle strength 5/5 in all extremities, sensation to light touch intact. Brachial reflex 2+ bilaterally. Knee reflex 1+ bilaterally. Negative Babinski's sign. Normal finger to nose test. Psych: Patient is not psychotic, no suicidal or hemocidal ideation.  Labs on Admission: I have personally reviewed following labs and imaging studies  CBC: Recent Labs  Lab 04/29/24 1516 05/04/24 1629  WBC 7.7 5.1  NEUTROABS 6.0  --   HGB 11.7* 10.9*  HCT 35.8* 33.7*  MCV 83.1 83.4  PLT 124* 101*   Basic Metabolic Panel: Recent Labs  Lab 04/29/24 1517 05/04/24 1333 05/04/24 1629  NA 140 142 141  K 2.9* 2.7* 2.8*  CL 97* 98 99  CO2 24 24 25   GLUCOSE 119* 138* 120*  BUN 26* 34* 33*  CREATININE 1.17* 1.51* 1.33*  CALCIUM  11.6* 12.1* 11.3*   GFR: Estimated Creatinine Clearance: 37.4 mL/min (A) (by C-G formula based on SCr of 1.33 mg/dL (H)). Liver Function Tests: Recent Labs  Lab 04/29/24 1517 05/04/24 1333 05/04/24 1629  AST 35 28 26  ALT 10 10 8   ALKPHOS 64 59 55  BILITOT 0.6 0.5 0.5  PROT 7.0 7.0 6.6  ALBUMIN 4.2 4.4 4.1   Recent Labs  Lab 05/04/24 1629  LIPASE 51   No results for input(s): AMMONIA in the last 168 hours. Coagulation Profile: No results for input(s): INR, PROTIME in the  last 168 hours. Cardiac Enzymes: No results for input(s): CKTOTAL, CKMB, CKMBINDEX, TROPONINI in the last 168 hours. BNP (last 3 results) No results for input(s): PROBNP in the last  8760 hours. HbA1C: No results for input(s): HGBA1C in the last 72 hours. CBG: No results for input(s): GLUCAP in the last 168 hours. Lipid Profile: No results for input(s): CHOL, HDL, LDLCALC, TRIG, CHOLHDL, LDLDIRECT in the last 72 hours. Thyroid Function Tests: No results for input(s): TSH, T4TOTAL, FREET4, T3FREE, THYROIDAB in the last 72 hours. Anemia Panel: No results for input(s): VITAMINB12, FOLATE, FERRITIN, TIBC, IRON , RETICCTPCT in the last 72 hours. Urine analysis:    Component Value Date/Time   COLORURINE YELLOW (A) 05/04/2024 1724   APPEARANCEUR HAZY (A) 05/04/2024 1724   APPEARANCEUR Hazy 10/07/2013 1844   LABSPEC 1.021 05/04/2024 1724   LABSPEC 1.025 10/07/2013 1844   PHURINE 5.0 05/04/2024 1724   GLUCOSEU NEGATIVE 05/04/2024 1724   GLUCOSEU Negative 10/07/2013 1844   HGBUR MODERATE (A) 05/04/2024 1724   BILIRUBINUR NEGATIVE 05/04/2024 1724   BILIRUBINUR negative 02/22/2024 1528   BILIRUBINUR Negative 10/07/2013 1844   KETONESUR 20 (A) 05/04/2024 1724   PROTEINUR 100 (A) 05/04/2024 1724   UROBILINOGEN 0.2 02/22/2024 1528   NITRITE NEGATIVE 05/04/2024 1724   LEUKOCYTESUR LARGE (A) 05/04/2024 1724   LEUKOCYTESUR Trace 10/07/2013 1844   Sepsis Labs: @LABRCNTIP (procalcitonin:4,lacticidven:4) )No results found for this or any previous visit (from the past 240 hours).   Radiological Exams on Admission:   Assessment/Plan Active Problems:   * No active hospital problems. *   Assessment and Plan: No notes have been filed under this hospital service. Service: Hospitalist      Active Problems:   * No active hospital problems. *    DVT ppx: SQ Heparin          SQ Lovenox   Code Status: Full code   ***  Family Communication:      not done, no family member is at bed side.              Yes, patient's    at bed side.       by phone   ***  Disposition Plan:  Anticipate discharge back to previous environment  Consults called:    Admission status and Level of care: :    for obs as inpt        Dispo: The patient is from: {From:23814}              Anticipated d/c is to: {To:23815}              Anticipated d/c date is: {Days:23816}              Patient currently {Medically stable:23817}    Severity of Illness:  {Observation/Inpatient:21159}       Date of Service 05/04/2024    Caleb Exon [facility]   If 7PM-7AM, please contact night-coverage [email or web address] 05/04/2024, 7:39 PM       [1] Allergies Allergen Reactions   Tylenol  [Acetaminophen ]     Per pt due to cirrhosis of the liver   Aleve [Naproxen] Rash  "

## 2024-05-04 NOTE — H&P (Incomplete)
 " History and Physical    Sharon Woods FMW:[id number] DOB: 09/22/1960 DOA: 05/04/2024  Referring MD/NP/PA:   PCP: Pcp, No   Patient coming from:  The patient is coming from home.     Chief Complaint:   HPI: Sharon Woods is a 64 y.o. female with medical history significant of      Data reviewed independently and ED Course: pt was found to have     ***       EKG: I have personally reviewed.  Not done in ED, will get one.   ***   Review of Systems:   General: no fevers, chills, no body weight gain, has poor appetite, has fatigue HEENT: no blurry vision, hearing changes or sore throat Respiratory: no dyspnea, coughing, wheezing CV: no chest pain, no palpitations GI: no nausea, vomiting, abdominal pain, diarrhea, constipation GU: no dysuria, burning on urination, increased urinary frequency, hematuria  Ext: no leg edema Neuro: no unilateral weakness, numbness, or tingling, no vision change or hearing loss Skin: no rash, no skin tear. MSK: No muscle spasm, no deformity, no limitation of range of movement in spin Heme: No easy bruising.  Travel history: No recent long distant travel.   Allergy: Allergies[1]  Past Medical History:  Diagnosis Date   Anxiety    Cancer (HCC)    Depression    Diabetes mellitus without complication (HCC)    History of high cholesterol 2000   Hyperlipidemia    Hypertension    Patient denies medical problems    Thrombocytopenia     Past Surgical History:  Procedure Laterality Date   ABDOMINAL HYSTERECTOMY     AXILLARY SENTINEL NODE BIOPSY Left 11/15/2021   Procedure: AXILLARY SENTINEL NODE BIOPSY;  Surgeon: Lane Shope, MD;  Location: [facility] ORS;  Service: General;  Laterality: Left;   BREAST BIOPSY Right 08/21/2021   us  bx 7:00 mass coil clip path pending   BREAST BIOPSY Right 08/21/2021   us  bx of LN, hydro marker, path pending   BREAST BIOPSY Left 08/21/2021   us  bx, heart marker, path pending   CESAREAN SECTION     x2    COLONOSCOPY WITH PROPOFOL  N/A 07/25/2022   Procedure: COLONOSCOPY WITH PROPOFOL ;  Surgeon: Therisa Bi, MD;  Location: [facility] ENDOSCOPY;  Service: Gastroenterology;  Laterality: N/A;   ESOPHAGOGASTRODUODENOSCOPY N/A 11/02/2023   Procedure: EGD (ESOPHAGOGASTRODUODENOSCOPY);  Surgeon: Therisa Bi, MD;  Location: [facility]COPY;  Service: Gastroenterology;  Laterality: N/A;   MASTECTOMY     MASTECTOMY MODIFIED RADICAL Right 11/15/2021   Procedure: MASTECTOMY MODIFIED RADICAL;  Surgeon: Lane Shope, MD;  Location: [facility] ORS;  Service: General;  Laterality: Right;   NO PAST SURGERIES     TOTAL MASTECTOMY Left 11/15/2021   Procedure: TOTAL MASTECTOMY;  Surgeon: Lane Shope, MD;  Location: [facility] ORS;  Service: General;  Laterality: Left;    Social History:  reports that she quit smoking about 13 years ago. Her smoking use included cigarettes. She has been exposed to tobacco smoke. She has never used smokeless tobacco. She reports that she does not currently use alcohol. She reports that she does not use drugs.  Family History:  Family History  Problem Relation Age of Onset   Lung cancer Mother    Dementia Mother    Parkinson's disease Father    Cancer Father        unk type   Diabetes Sister    Heart attack Sister    Breast cancer Sister    Cancer Maternal  Uncle        unk types   Dementia Maternal Grandmother    Cancer Maternal Grandmother        unk type   Cancer Other    Dementia Other      Prior to Admission medications  Medication Sig Start Date End Date Taking? Authorizing Provider  blood glucose meter kit and supplies KIT Dispense based on patient and insurance preference. Use up to four times daily as directed. 09/27/21   Babara Call, MD  Blood Glucose Monitoring Suppl DEVI Dispense based on patient and insurance preference. Use up to four times daily as directed. (FOR ICD-10 E11.9). 03/01/24   Bernardo Fend, DO  calcium  carbonate (OS-CAL) 600 MG TABS tablet Take 2 tablets  (1,200 mg total) by mouth daily. 04/29/22   Babara Call, MD  capivasertib  (TRUQAP ) 160 MG pack Take 2 tablets (320 mg total) by mouth 2 (two) times daily. Take for 4 days, then hold for 3 days. Repeat every 7 days. 04/11/24   Babara Call, MD  diphenoxylate -atropine  (LOMOTIL ) 2.5-0.025 MG tablet Take 1 tablet by mouth 4 (four) times daily as needed for diarrhea or loose stools. 03/29/24   Babara Call, MD  escitalopram  (LEXAPRO ) 20 MG tablet Take 1 tablet (20 mg total) by mouth daily. 11/20/23   Bernardo Fend, DO  famotidine  (PEPCID ) 40 MG tablet Take 1 tablet (40 mg total) by mouth daily. 11/20/23   Bernardo Fend, DO  Glucose Blood (BLOOD GLUCOSE TEST STRIPS) STRP Dispense based on patient and insurance preference. Use up to four times daily as directed. (FOR ICD-10 E11.9). 03/01/24   Bernardo Fend, DO  hydrOXYzine  (ATARAX ) 10 MG tablet Take 1 tablet (10 mg total) by mouth 2 (two) times daily. 01/08/24   Bernardo Fend, DO  Lancet Device MISC Dispense based on patient and insurance preference. Use up to four times daily as directed. (FOR ICD-10 E11.9). 03/01/24   Bernardo Fend, DO  Lancets MISC Dispense based on patient and insurance preference. Use up to four times daily as directed. (FOR ICD-10 E11.9). 03/01/24   Bernardo Fend, DO  loratadine (CLARITIN) 10 MG tablet Take 10 mg by mouth daily.    [provider]  metFORMIN  (GLUCOPHAGE ) 500 MG tablet Take 1 tablet (500 mg total) by mouth 2 (two) times daily with a meal. 11/20/23   Bernardo Fend, DO  Multiple Vitamin (MULTIVITAMIN ADULT PO) Take 1 tablet by mouth daily.    [provider]  ondansetron  (ZOFRAN ) 8 MG tablet Take 1 tablet (8 mg total) by mouth every 8 (eight) hours as needed for nausea or vomiting. 10/06/23   Leonard, Alyson N, RPH-CPP  potassium chloride  SA (KLOR-CON  M) 20 MEQ tablet Take 1 tablet (20 mEq total) by mouth daily. Patient not taking: Reported on 05/04/2024 12/10/23   Babara Call, MD  rosuvastatin   (CRESTOR ) 10 MG tablet Take 1 tablet (10 mg total) by mouth daily. 04/04/24   Bernardo Fend, DO  Semaglutide  (RYBELSUS ) 14 MG TABS Take 1 tablet (14 mg total) by mouth daily. 03/01/24   Bernardo Fend, DO  traMADol  (ULTRAM ) 50 MG tablet TAKE 1 TABLET BY MOUTH EVERY 6 HOURS AS NEEDED 05/03/24   Babara Call, MD  triamcinolone  ointment (KENALOG ) 0.5 % Apply 1 Application topically 2 (two) times daily. 09/02/22   Babara Call, MD  ZINC  OXIDE, TOPICAL, 10 % CREA Apply 1 each topically daily. 12/10/23   Bernardo Fend, DO    Physical Exam: Vitals:   05/04/24 1625 05/04/24 1800  BP:  128/74 138/65  Pulse: 90 98  Resp: 17 19  Temp: 97.6 F (36.4 C)   TempSrc: Oral   SpO2: 100% 100%   General: Not in acute distress HEENT:       Eyes: PERRL, EOMI, no jaundice       ENT: No discharge from the ears and nose, no pharynx injection, no tonsillar enlargement.        Neck: No JVD, no bruit, no mass felt. Heme: No neck lymph node enlargement. Cardiac: S1/S2, RRR, No murmurs, No gallops or rubs. Respiratory: No rales, wheezing, rhonchi or rubs. GI: Soft, nondistended, nontender, no rebound pain, no organomegaly, BS present. GU: No hematuria Ext: No pitting leg edema bilaterally. 1+DP/PT pulse bilaterally. Musculoskeletal: No joint deformities, No joint redness or warmth, no limitation of ROM in spin. Skin: No rashes.  Neuro: Alert, oriented X3, cranial nerves II-XII grossly intact, moves all extremities normally. Muscle strength 5/5 in all extremities, sensation to light touch intact. Brachial reflex 2+ bilaterally. Knee reflex 1+ bilaterally. Negative Babinski's sign. Normal finger to nose test. Psych: Patient is not psychotic, no suicidal or hemocidal ideation.  Labs on Admission: I have personally reviewed following labs and imaging studies  CBC: Recent Labs  Lab 04/29/24 1516 05/04/24 1629  WBC 7.7 5.1  NEUTROABS 6.0  --   HGB 11.7* 10.9*  HCT 35.8* 33.7*  MCV 83.1 83.4  PLT 124* 101*    Basic Metabolic Panel: Recent Labs  Lab 04/29/24 1517 05/04/24 1333 05/04/24 1629  NA 140 142 141  K 2.9* 2.7* 2.8*  CL 97* 98 99  CO2 24 24 25   GLUCOSE 119* 138* 120*  BUN 26* 34* 33*  CREATININE 1.17* 1.51* 1.33*  CALCIUM  11.6* 12.1* 11.3*   GFR: Estimated Creatinine Clearance: 37.4 mL/min (A) (by C-G formula based on SCr of 1.33 mg/dL (H)). Liver Function Tests: Recent Labs  Lab 04/29/24 1517 05/04/24 1333 05/04/24 1629  AST 35 28 26  ALT 10 10 8   ALKPHOS 64 59 55  BILITOT 0.6 0.5 0.5  PROT 7.0 7.0 6.6  ALBUMIN 4.2 4.4 4.1   Recent Labs  Lab 05/04/24 1629  LIPASE 51   No results for input(s): AMMONIA in the last 168 hours. Coagulation Profile: No results for input(s): INR, PROTIME in the last 168 hours. Cardiac Enzymes: No results for input(s): CKTOTAL, CKMB, CKMBINDEX, TROPONINI in the last 168 hours. BNP (last 3 results) No results for input(s): PROBNP in the last 8760 hours. HbA1C: No results for input(s): HGBA1C in the last 72 hours. CBG: No results for input(s): GLUCAP in the last 168 hours. Lipid Profile: No results for input(s): CHOL, HDL, LDLCALC, TRIG, CHOLHDL, LDLDIRECT in the last 72 hours. Thyroid Function Tests: No results for input(s): TSH, T4TOTAL, FREET4, T3FREE, THYROIDAB in the last 72 hours. Anemia Panel: No results for input(s): VITAMINB12, FOLATE, FERRITIN, TIBC, IRON , RETICCTPCT in the last 72 hours. Urine analysis:    Component Value Date/Time   COLORURINE YELLOW (A) 05/04/2024 1724   APPEARANCEUR HAZY (A) 05/04/2024 1724   APPEARANCEUR Hazy 10/07/2013 1844   LABSPEC 1.021 05/04/2024 1724   LABSPEC 1.025 10/07/2013 1844   PHURINE 5.0 05/04/2024 1724   GLUCOSEU NEGATIVE 05/04/2024 1724   GLUCOSEU Negative 10/07/2013 1844   HGBUR MODERATE (A) 05/04/2024 1724   BILIRUBINUR NEGATIVE 05/04/2024 1724   BILIRUBINUR negative 02/22/2024 1528   BILIRUBINUR Negative 10/07/2013  1844   KETONESUR 20 (A) 05/04/2024 1724   PROTEINUR 100 (A) 05/04/2024 1724   UROBILINOGEN 0.2  02/22/2024 1528   NITRITE NEGATIVE 05/04/2024 1724   LEUKOCYTESUR LARGE (A) 05/04/2024 1724   LEUKOCYTESUR Trace 10/07/2013 1844   Sepsis Labs: @LABRCNTIP (procalcitonin:4,lacticidven:4) )No results found for this or any previous visit (from the past 240 hours).   Radiological Exams on Admission:   Assessment/Plan Active Problems:   * No active hospital problems. *   Assessment and Plan: No notes have been filed under this hospital service. Service: Hospitalist      Active Problems:   * No active hospital problems. *    DVT ppx: SQ Heparin          SQ Lovenox   Code Status: Full code   ***  Family Communication:     not done, no family member is at bed side.              Yes, patient's    at bed side.       by phone   ***  Disposition Plan:  Anticipate discharge back to previous environment  Consults called:    Admission status and Level of care: :    for obs as inpt        Dispo: The patient is from: {From:23814}              Anticipated d/c is to: {To:23815}              Anticipated d/c date is: {Days:23816}              Patient currently {Medically stable:23817}    Severity of Illness:  {Observation/Inpatient:21159}       Date of Service 05/04/2024    Caleb Exon [facility]   If 7PM-7AM, please contact night-coverage [email or web address] 05/04/2024, 7:39 PM     [1]  Allergies Allergen Reactions   Tylenol  [Acetaminophen ]     Per pt due to cirrhosis of the liver   Aleve [Naproxen] Rash   "

## 2024-05-04 NOTE — ED Triage Notes (Signed)
 First nurse note:pt to ED from cancer center for K+ 2.7, Ca+ 12.1. has breast cancer with mets and liver cirrhosis.

## 2024-05-05 ENCOUNTER — Encounter: Payer: Self-pay | Admitting: Internal Medicine

## 2024-05-05 LAB — PROTIME-INR
INR: 1.2 (ref 0.8–1.2)
Prothrombin Time: 16.1 s — ABNORMAL HIGH (ref 11.4–15.2)

## 2024-05-05 LAB — BASIC METABOLIC PANEL WITH GFR
Anion gap: 13 (ref 5–15)
BUN: 25 mg/dL — ABNORMAL HIGH (ref 8–23)
CO2: 22 mmol/L (ref 22–32)
Calcium: 9.4 mg/dL (ref 8.9–10.3)
Chloride: 106 mmol/L (ref 98–111)
Creatinine, Ser: 0.96 mg/dL (ref 0.44–1.00)
GFR, Estimated: >60 mL/min
Glucose, Bld: 96 mg/dL (ref 70–99)
Potassium: 3.3 mmol/L — ABNORMAL LOW (ref 3.5–5.1)
Sodium: 141 mmol/L (ref 135–145)

## 2024-05-05 LAB — CBC
HCT: 27.4 % — ABNORMAL LOW (ref 36.0–46.0)
Hemoglobin: 9 g/dL — ABNORMAL LOW (ref 12.0–15.0)
MCH: 27.5 pg (ref 26.0–34.0)
MCHC: 32.8 g/dL (ref 30.0–36.0)
MCV: 83.8 fL (ref 80.0–100.0)
Platelets: 73 10*3/uL — ABNORMAL LOW (ref 150–400)
RBC: 3.27 MIL/uL — ABNORMAL LOW (ref 3.87–5.11)
RDW: 17.7 % — ABNORMAL HIGH (ref 11.5–15.5)
WBC: 3.2 10*3/uL — ABNORMAL LOW (ref 4.0–10.5)
nRBC: 0 % (ref 0.0–0.2)

## 2024-05-05 LAB — CBG MONITORING, ED
Glucose-Capillary: 111 mg/dL — ABNORMAL HIGH (ref 70–99)
Glucose-Capillary: 106 mg/dL — ABNORMAL HIGH (ref 70–99)
Glucose-Capillary: 101 mg/dL — ABNORMAL HIGH (ref 70–99)
Glucose-Capillary: 96 mg/dL (ref 70–99)

## 2024-05-05 LAB — PHOSPHORUS: Phosphorus: 2.4 mg/dL — ABNORMAL LOW (ref 2.5–4.6)

## 2024-05-05 LAB — AMMONIA: Ammonia: <13 umol/L (ref 9–35)

## 2024-05-05 LAB — HIV ANTIBODY (ROUTINE TESTING W REFLEX): HIV Screen 4th Generation wRfx: NONREACTIVE

## 2024-05-05 LAB — APTT: aPTT: 27 s (ref 24–36)

## 2024-05-05 LAB — MAGNESIUM: Magnesium: 2 mg/dL (ref 1.7–2.4)

## 2024-05-05 MED ORDER — POTASSIUM PHOSPHATES 15 MMOLE/5ML IV SOLN
30.0000 mmol | Freq: Once | INTRAVENOUS | Status: AC
  Administered 2024-05-05: 30 mmol via INTRAVENOUS
  Filled 2024-05-05: qty 10

## 2024-05-05 MED ORDER — POTASSIUM & SODIUM PHOSPHATES 280-160-250 MG PO PACK
2.0000 | PACK | Freq: Once | ORAL | Status: AC
  Administered 2024-05-05: 2 via ORAL
  Filled 2024-05-05: qty 2

## 2024-05-05 MED ORDER — POTASSIUM CHLORIDE CRYS ER 20 MEQ PO TBCR
20.0000 meq | EXTENDED_RELEASE_TABLET | Freq: Once | ORAL | Status: AC
  Administered 2024-05-05: 20 meq via ORAL
  Filled 2024-05-05: qty 1

## 2024-05-05 NOTE — Evaluation (Signed)
 Occupational Therapy Evaluation Patient Details Name: Sharon Woods MRN: [id number] DOB: 10/22/1960 Today's Date: 05/05/2024   History of Present Illness   64 y/o female presented to ED on 05/04/24 from cancer center for low potassium. Admitted for UTI and electrolyte disturbance. PMH: stage IV breast cancer, liver cirrhosis, HTN, DM, depression with anxiety, CKD stage IIIa, anemia     Clinical Impressions Patient presenting with decreased Ind in self care,balance, functional mobility, transfers, endurance, and safety awareness. Patient reports living at home with husband. She is unable to verbalize home set up as she is poor historian and no family present at time of evaluation. Pt ambulates with min guard 100'. Her pants begins to fall down and she has to be told to adjust clothing for safety. Pt returning to ED stretcher after ambulation. All needs within reach.Patient will benefit from acute OT to increase overall independence in the areas of ADLs, functional mobility, and safety awareness in order to safely discharge.     If plan is discharge home, recommend the following:   A little help with walking and/or transfers;A little help with bathing/dressing/bathroom;Assistance with cooking/housework;Assistance with feeding;Direct supervision/assist for medications management;Direct supervision/assist for financial management;Assist for transportation;Help with stairs or ramp for entrance;Supervision due to cognitive status     Functional Status Assessment   Patient has had a recent decline in their functional status and demonstrates the ability to make significant improvements in function in a reasonable and predictable amount of time.     Equipment Recommendations   None recommended by OT      Precautions/Restrictions   Precautions Precautions: Fall Recall of Precautions/Restrictions: Impaired     Mobility Bed Mobility Overal bed mobility: Needs Assistance Bed Mobility:  Sit to Supine       Sit to supine: Contact guard assist        Transfers Overall transfer level: Needs assistance Equipment used: None   Sit to Stand: Contact guard assist                  Balance Overall balance assessment: Mild deficits observed, not formally tested                                         ADL either performed or assessed with clinical judgement   ADL Overall ADL's : Needs assistance/impaired                         Toilet Transfer: Contact guard assist Toilet Transfer Details (indicate cue type and reason): simulated transfer         Functional mobility during ADLs: Contact guard assist       Vision Patient Visual Report: No change from baseline              Pertinent Vitals/Pain Pain Assessment Pain Assessment: Faces Faces Pain Scale: Hurts little more Pain Location: L flank Pain Descriptors / Indicators: Discomfort, Grimacing Pain Intervention(s): Limited activity within patient's tolerance, Monitored during session, Repositioned     Extremity/Trunk Assessment Upper Extremity Assessment Upper Extremity Assessment: Generalized weakness   Lower Extremity Assessment Lower Extremity Assessment: Generalized weakness   Cervical / Trunk Assessment Cervical / Trunk Assessment: Normal   Communication Communication Communication: No apparent difficulties   Cognition Arousal: Alert Behavior During Therapy: WFL for tasks assessed/performed Cognition: No family/caregiver present to determine baseline, Cognition impaired   Orientation impairments:  Time, Situation Awareness: Intellectual awareness impaired, Online awareness impaired   Attention impairment (select first level of impairment): Sustained attention Executive functioning impairment (select all impairments): Initiation, Organization, Sequencing, Reasoning, Problem solving                   Following commands: Impaired Following commands  impaired: Follows one step commands with increased time, Follows one step commands inconsistently     Cueing  General Comments   Cueing Techniques: Verbal cues              Home Living Family/patient expects to be discharged to:: Private residence Living Arrangements: Spouse/significant other Available Help at Discharge: Family Type of Home: House Home Access: Stairs to enter Secretary/administrator of Steps: 2-4 Entrance Stairs-Rails: None                     Additional Comments: patient is poor historian      Prior Functioning/Environment Prior Level of Function : Patient poor historian/Family not available;Independent/Modified Independent                    OT Problem List: Decreased strength;Decreased activity tolerance;Decreased cognition;Impaired balance (sitting and/or standing);Decreased safety awareness   OT Treatment/Interventions: Self-care/ADL training;Balance training;Therapeutic exercise;Therapeutic activities;Energy conservation;Cognitive remediation/compensation;Patient/family education      OT Goals(Current goals can be found in the care plan section)   Acute Rehab OT Goals Patient Stated Goal: to go home OT Goal Formulation: With patient Time For Goal Achievement: 05/19/24 Potential to Achieve Goals: Fair ADL Goals Pt Will Perform Grooming: with modified independence Pt Will Perform Lower Body Dressing: with modified independence Pt Will Transfer to Toilet: with modified independence;ambulating Pt Will Perform Toileting - Clothing Manipulation and hygiene: with modified independence;sit to/from stand   OT Frequency:  Min 2X/week    Co-evaluation              AM-PAC OT 6 Clicks Daily Activity     Outcome Measure Help from another person eating meals?: None Help from another person taking care of personal grooming?: None Help from another person toileting, which includes using toliet, bedpan, or urinal?: A Little Help  from another person bathing (including washing, rinsing, drying)?: A Little Help from another person to put on and taking off regular upper body clothing?: None Help from another person to put on and taking off regular lower body clothing?: A Little 6 Click Score: 21   End of Session Nurse Communication: Mobility status  Activity Tolerance: Patient tolerated treatment well Patient left: in bed;with call bell/phone within reach;with bed alarm set  OT Visit Diagnosis: Unsteadiness on feet (R26.81);Repeated falls (R29.6);Muscle weakness (generalized) (M62.81)                Time: 8998-8987 OT Time Calculation (min): 11 min Charges:  OT General Charges $OT Visit: 1 Visit OT Evaluation $OT Eval Low Complexity: 1 Low  Izetta Claude, MS, OTR/L , CBIS ascom [phone]  05/05/24, 2:05 PM

## 2024-05-05 NOTE — ED Notes (Signed)
 Pt will not keep cardiac leads on/ pt is confused and does not follow commands when asked to leave monitor on.

## 2024-05-05 NOTE — Evaluation (Signed)
 Physical Therapy Evaluation Patient Details Name: Sharon Woods MRN: [id number] DOB: 1960/11/10 Today's Date: 05/05/2024  History of Present Illness  64 y/o female presented to ED on 05/04/24 from cancer center for low potassium. Admitted for UTI and electrolyte disturbance. PMH: stage IV breast cancer, liver cirrhosis, HTN, DM, depression with anxiety, CKD stage IIIa, anemia  Clinical Impression  Patient admitted with the above. PTA, patient lives with husband and reports she was independent. Patient is poor historian and oriented to self and place. Able to answer month/year with hesitation. Patient presents with weakness, impaired balance, impaired cognition, and decreased activity tolerance. Required CGA for bed mobility and sit to stand from elevated stretcher. Ambulated 100' with no AD and CGA-supervision with patient drifting L/R but no overt LOB. Patient will require 24/7 supervision 2/2 cognitive status. Patient will benefit from skilled PT services during acute stay to address listed deficits. Patient will benefit from ongoing therapy at discharge to maximize functional independence and safety.         If plan is discharge home, recommend the following: A little help with walking and/or transfers;A little help with bathing/dressing/bathroom;Assistance with cooking/housework;Direct supervision/assist for medications management;Direct supervision/assist for financial management;Assist for transportation;Help with stairs or ramp for entrance;Supervision due to cognitive status   Can travel by private vehicle        Equipment Recommendations None recommended by PT  Recommendations for Other Services       Functional Status Assessment Patient has had a recent decline in their functional status and demonstrates the ability to make significant improvements in function in a reasonable and predictable amount of time.     Precautions / Restrictions Precautions Precautions: Fall Recall of  Precautions/Restrictions: Impaired Restrictions Weight Bearing Restrictions Per Provider Order: No      Mobility  Bed Mobility Overal bed mobility: Needs Assistance Bed Mobility: Sit to Supine       Sit to supine: Contact guard assist   General bed mobility comments: seated EOB on arrival    Transfers Overall transfer level: Needs assistance Equipment used: None Transfers: Sit to/from Stand Sit to Stand: Contact guard assist                Ambulation/Gait Ambulation/Gait assistance: Contact guard assist, Supervision Gait Distance (Feet): 100 Feet Assistive device: None Gait Pattern/deviations: Step-through pattern, Decreased stride length Gait velocity: decreased     General Gait Details: drifting L/R throughout but no overt LOB  Stairs            Wheelchair Mobility     Tilt Bed    Modified Rankin (Stroke Patients Only)       Balance Overall balance assessment: Mild deficits observed, not formally tested                                           Pertinent Vitals/Pain Pain Assessment Pain Assessment: Faces Faces Pain Scale: Hurts little more Pain Location: L flank Pain Descriptors / Indicators: Discomfort, Grimacing Pain Intervention(s): Limited activity within patient's tolerance, Monitored during session, Repositioned    Home Living Family/patient expects to be discharged to:: Private residence Living Arrangements: Spouse/significant other Available Help at Discharge: Family Type of Home: House Home Access: Stairs to enter Entrance Stairs-Rails: None Entrance Stairs-Number of Steps: 2-4       Additional Comments: patient is poor historian    Prior Function Prior Level of Function :  Patient poor historian/Family not available;Independent/Modified Independent                     Extremity/Trunk Assessment   Upper Extremity Assessment Upper Extremity Assessment: Defer to OT evaluation    Lower  Extremity Assessment Lower Extremity Assessment: Generalized weakness    Cervical / Trunk Assessment Cervical / Trunk Assessment: Normal  Communication   Communication Communication: No apparent difficulties    Cognition Arousal: Alert Behavior During Therapy: WFL for tasks assessed/performed   PT - Cognitive impairments: No family/caregiver present to determine baseline                       PT - Cognition Comments: oriented to self and place. Patient hesitant with time but able to state February 2026 Following commands: Impaired Following commands impaired: Follows one step commands with increased time, Follows one step commands inconsistently     Cueing       General Comments      Exercises     Assessment/Plan    PT Assessment Patient needs continued PT services  PT Problem List Decreased strength;Decreased activity tolerance;Decreased balance;Decreased mobility;Decreased cognition       PT Treatment Interventions DME instruction;Gait training;Functional mobility training;Therapeutic exercise;Therapeutic activities;Stair training;Balance training;Neuromuscular re-education;Patient/family education    PT Goals (Current goals can be found in the Care Plan section)  Acute Rehab PT Goals Patient Stated Goal: did not state PT Goal Formulation: Patient unable to participate in goal setting Time For Goal Achievement: 05/19/24 Potential to Achieve Goals: Good    Frequency Min 1X/week     Co-evaluation               AM-PAC PT 6 Clicks Mobility  Outcome Measure Help needed turning from your back to your side while in a flat bed without using bedrails?: A Little Help needed moving from lying on your back to sitting on the side of a flat bed without using bedrails?: A Little Help needed moving to and from a bed to a chair (including a wheelchair)?: A Little Help needed standing up from a chair using your arms (e.g., wheelchair or bedside chair)?: A  Little Help needed to walk in hospital room?: A Little Help needed climbing 3-5 steps with a railing? : A Little 6 Click Score: 18    End of Session   Activity Tolerance: Patient tolerated treatment well Patient left: in bed;with call bell/phone within reach Nurse Communication: Mobility status PT Visit Diagnosis: Muscle weakness (generalized) (M62.81);Unsteadiness on feet (R26.81)    Time: 1000-1012 PT Time Calculation (min) (ACUTE ONLY): 12 min   Charges:   PT Evaluation $PT Eval Low Complexity: 1 Low   PT General Charges $$ ACUTE PT VISIT: 1 Visit         Maryanne Finder, PT, DPT Physical Therapist - [facility]  [facility]  Tysheena Ginzburg A Sholonda Jobst 05/05/2024, 12:38 PM

## 2024-05-05 NOTE — Progress Notes (Signed)
 PHARMACY CONSULT NOTE - ELECTROLYTES  Pharmacy Consult for Electrolyte Monitoring and Replacement   Recent Labs: Potassium (mmol/L)  Date Value  05/05/2024 3.3 (L)  10/07/2013 3.2 (L)   Magnesium  (mg/dL)  Date Value  97/94/7973 2.0   Calcium  (mg/dL)  Date Value  97/94/7973 9.4   Calcium , Total (mg/dL)  Date Value  92/89/7984 8.7   Albumin (g/dL)  Date Value  97/95/7973 4.1  10/07/2013 3.6   Phosphorus (mg/dL)  Date Value  97/94/7973 2.4 (L)   Sodium (mmol/L)  Date Value  05/05/2024 141  10/07/2013 141   Corrected Ca: 9.4 mg/dL    Estimated Creatinine Clearance: 51.8 mL/min (by C-G formula based on SCr of 0.96 mg/dL).  Assessment  Sharon Woods is a 64 y.o. female presenting with UTI. PMH significant for sage IV breast cancer, liver cirrhosis, CKD, DM, HTN, HLD. Pharmacy has been consulted to monitor and replace electrolytes.  2/5 AM Kphos back from last PM was not yet completed upon collection of AM labs.   Diet: regular, heart healthy MIVF: NS @ 75 mL/hr Pertinent medications: pamidronate   Goal of Therapy: Electrolytes within normal limits  Plan:  KCl 20 meq po x 1 Phos-NaK packets 2 x 1 F/U AM labs  Thank you for allowing pharmacy to be a part of this patients care.  Bari Hamilton D, PharmD Clinical Pharmacist 05/05/2024 7:14 AM

## 2024-05-05 NOTE — Progress Notes (Addendum)
 " PROGRESS NOTE    Sharon Woods  FMW:[id number] DOB: 1960-08-18 DOA: 05/04/2024 PCP: Pcp, No  Assessment & Plan:   Principal Problem:   UTI (urinary tract infection) Active Problems:   Electrolyte disturbance   Hypokalemia   Hypomagnesemia   Hypophosphatemia   Hypercalcemia   Chronic kidney disease, stage 3a (HCC)   Type II diabetes mellitus with renal manifestations (HCC)   Thrombocytopenia   Normocytic anemia   Liver cirrhosis (HCC)   Malignant neoplasm of left breast, stage 4 (HCC)   Confusion   Depression with anxiety  Assessment and Plan: UTI: urine cx is pending. Continue on IV rocephin    Hypokalemia: potassium ordered.   Hypophosphatemia: sodium & potassium phosphate  given    Hypercalcemia: Likely due to stage IV breast cancer. S/p pamidronate . Continue on IVFs. Holding home dose of os-cal    CKDIIIa: Cr is trending down from day prior. Avoid nephrotoxic meds  DM2: poorly controlled, HbA1c 7.8. Continue on SSI w/ accuchecks    Thrombocytopenia: trending down. Likely secondary to cirrhosis.. Will continue to monitor  Normocytic anemia: likely secondary to cirrhosis. No need for a transfusion currently    Liver cirrhosis: not on lactulose, rifaximin as per med rec. Ammonia is WNL    Stage IV breast cancer: management per onco outpatient. Pt will have to bring in home dose of capibasertib    Acute metabolic encephalopathy: likely secondary to above infection. Continue on IV abxs. Re-orient prn   Depression: severity unknown. Continue on home dose of lexapro        DVT prophylaxis: SCDs Code Status: full  Family Communication:  Disposition Plan: likely d/c back home   Level of care: Telemetry  Status is: Inpatient Remains inpatient appropriate because: severity of illness    Consultants:    Procedures:   Antimicrobials: rocephin   Subjective: Pt is pleasantly confused  Objective: Vitals:   05/05/24 0900 05/05/24 0930 05/05/24 1000  05/05/24 1027  BP: 118/85 125/83 129/80   Pulse: 88 96 89   Resp:  (!) 24 13   Temp:    98.6 F (37 C)  TempSrc:    Oral  SpO2: 100% 100% 98%     Intake/Output Summary (Last 24 hours) at 05/05/2024 1041 Last data filed at 05/05/2024 0730 Gross per 24 hour  Intake 500 ml  Output --  Net 500 ml   There were no vitals filed for this visit.  Examination:  General exam: Appears calm and comfortable  Respiratory system: Clear to auscultation. Respiratory effort normal. Cardiovascular system: S1 & S2+. No rubs, gallops or clicks.  Gastrointestinal system: Abdomen is nondistended, soft and nontender. Normal bowel sounds heard. Central nervous system: Alert and oriented x3 but not situation  Psychiatry: Judgement and insight appears poor. Flat mood and affect    Data Reviewed: I have personally reviewed following labs and imaging studies  CBC: Recent Labs  Lab 04/29/24 1516 05/04/24 1629 05/05/24 0506  WBC 7.7 5.1 3.2*  NEUTROABS 6.0  --   --   HGB 11.7* 10.9* 9.0*  HCT 35.8* 33.7* 27.4*  MCV 83.1 83.4 83.8  PLT 124* 101* 73*   Basic Metabolic Panel: Recent Labs  Lab 04/29/24 1517 05/04/24 1333 05/04/24 1628 05/04/24 1629 05/05/24 0506  NA 140 142  --  141 141  K 2.9* 2.7*  --  2.8* 3.3*  CL 97* 98  --  99 106  CO2 24 24  --  25 22  GLUCOSE 119* 138*  --  120*  96  BUN 26* 34*  --  33* 25*  CREATININE 1.17* 1.51*  --  1.33* 0.96  CALCIUM  11.6* 12.1*  --  11.3* 9.4  MG  --   --   --  1.3* 2.0  PHOS  --   --  1.6*  --  2.4*   GFR: Estimated Creatinine Clearance: 51.8 mL/min (by C-G formula based on SCr of 0.96 mg/dL). Liver Function Tests: Recent Labs  Lab 04/29/24 1517 05/04/24 1333 05/04/24 1629  AST 35 28 26  ALT 10 10 8   ALKPHOS 64 59 55  BILITOT 0.6 0.5 0.5  PROT 7.0 7.0 6.6  ALBUMIN 4.2 4.4 4.1   Recent Labs  Lab 05/04/24 1629  LIPASE 51   No results for input(s): AMMONIA in the last 168 hours. Coagulation Profile: No results for input(s):  INR, PROTIME in the last 168 hours. Cardiac Enzymes: No results for input(s): CKTOTAL, CKMB, CKMBINDEX, TROPONINI in the last 168 hours. BNP (last 3 results) No results for input(s): PROBNP in the last 8760 hours. HbA1C: No results for input(s): HGBA1C in the last 72 hours. CBG: Recent Labs  Lab 05/04/24 2255 05/05/24 0750  GLUCAP 78 111*   Lipid Profile: No results for input(s): CHOL, HDL, LDLCALC, TRIG, CHOLHDL, LDLDIRECT in the last 72 hours. Thyroid Function Tests: No results for input(s): TSH, T4TOTAL, FREET4, T3FREE, THYROIDAB in the last 72 hours. Anemia Panel: No results for input(s): VITAMINB12, FOLATE, FERRITIN, TIBC, IRON , RETICCTPCT in the last 72 hours. Sepsis Labs: No results for input(s): PROCALCITON, LATICACIDVEN in the last 168 hours.  No results found for this or any previous visit (from the past 240 hours).       Radiology Studies: CT Head Wo Contrast Result Date: 05/04/2024 CLINICAL DATA:  Altered mental status. EXAM: CT HEAD WITHOUT CONTRAST TECHNIQUE: Contiguous axial images were obtained from the base of the skull through the vertex without intravenous contrast. RADIATION DOSE REDUCTION: This exam was performed according to the departmental dose-optimization program which includes automated exposure control, adjustment of the mA and/or kV according to patient size and/or use of iterative reconstruction technique. COMPARISON:  Head CT 08/28/2015.  Brain MRI 02/14/2023 FINDINGS: Brain: No intracranial hemorrhage, mass effect, or midline shift. No hydrocephalus. The basilar cisterns are patent. No CT correlate for the T2 hyperintensities on prior MRI. No evidence of territorial infarct or acute ischemia. No extra-axial or intracranial fluid collection. Vascular: No hyperdense vessel or unexpected calcification. Skull: Questionable lucent lesions within the right frontal calvarium series 3, image 33 midline  occipital bone series 3, image 60 and right parietal bone series 3, image 67. Sinuses/Orbits: Mucous retention cyst in the left maxillary sinus. No mastoid effusion. Other: No definite scalp soft tissue abnormality. IMPRESSION: 1. No acute intracranial abnormality. 2. Questionable lucent lesions within the calvarium. Per review of electronic records, patient has history of breast neoplasm with osseous metastatic disease. Electronically Signed   By: Andrea Gasman M.D.   On: 05/04/2024 18:18        Scheduled Meds:  capivasertib   320 mg Oral BID   escitalopram   20 mg Oral Daily   famotidine   40 mg Oral Daily   insulin  aspart  0-5 Units Subcutaneous QHS   insulin  aspart  0-9 Units Subcutaneous TID WC   potassium & sodium phosphates   2 packet Oral Once   Continuous Infusions:  sodium chloride  75 mL/hr at 05/04/24 2033   cefTRIAXone  (ROCEPHIN )  IV       LOS: 1 day  Anthony CHRISTELLA Pouch, MD [facility] Pager [phone]  If 7PM-7AM, please contact night-coverage [email or web address] 05/05/2024, 10:41 AM   "

## 2024-05-05 NOTE — ED Notes (Signed)
 Pt ambulated to restroom; she remains confused; and reports back is hurting her.

## 2024-05-06 LAB — GLUCOSE, CAPILLARY
Glucose-Capillary: 105 mg/dL — ABNORMAL HIGH (ref 70–99)
Glucose-Capillary: 127 mg/dL — ABNORMAL HIGH (ref 70–99)
Glucose-Capillary: 133 mg/dL — ABNORMAL HIGH (ref 70–99)
Glucose-Capillary: 162 mg/dL — ABNORMAL HIGH (ref 70–99)

## 2024-05-06 LAB — BASIC METABOLIC PANEL WITH GFR
Anion gap: 10 (ref 5–15)
BUN: 19 mg/dL (ref 8–23)
CO2: 24 mmol/L (ref 22–32)
Calcium: 8.9 mg/dL (ref 8.9–10.3)
Chloride: 105 mmol/L (ref 98–111)
Creatinine, Ser: 0.77 mg/dL (ref 0.44–1.00)
GFR, Estimated: >60 mL/min
Glucose, Bld: 121 mg/dL — ABNORMAL HIGH (ref 70–99)
Potassium: 3.2 mmol/L — ABNORMAL LOW (ref 3.5–5.1)
Sodium: 139 mmol/L (ref 135–145)

## 2024-05-06 LAB — CBC
HCT: 28.4 % — ABNORMAL LOW (ref 36.0–46.0)
Hemoglobin: 9.4 g/dL — ABNORMAL LOW (ref 12.0–15.0)
MCH: 28.2 pg (ref 26.0–34.0)
MCHC: 33.1 g/dL (ref 30.0–36.0)
MCV: 85.3 fL (ref 80.0–100.0)
Platelets: 66 10*3/uL — ABNORMAL LOW (ref 150–400)
RBC: 3.33 MIL/uL — ABNORMAL LOW (ref 3.87–5.11)
RDW: 18.2 % — ABNORMAL HIGH (ref 11.5–15.5)
WBC: 2.8 10*3/uL — ABNORMAL LOW (ref 4.0–10.5)
nRBC: 0 % (ref 0.0–0.2)

## 2024-05-06 LAB — URINE CULTURE: Culture: 10000 — AB

## 2024-05-06 LAB — PHOSPHORUS: Phosphorus: 2.7 mg/dL (ref 2.5–4.6)

## 2024-05-06 LAB — MAGNESIUM: Magnesium: 1.6 mg/dL — ABNORMAL LOW (ref 1.7–2.4)

## 2024-05-06 MED ORDER — CYCLOBENZAPRINE HCL 10 MG PO TABS
5.0000 mg | ORAL_TABLET | Freq: Three times a day (TID) | ORAL | Status: AC | PRN
  Administered 2024-05-06: 5 mg via ORAL
  Filled 2024-05-06: qty 1

## 2024-05-06 MED ORDER — MAGNESIUM SULFATE 2 GM/50ML IV SOLN
2.0000 g | Freq: Once | INTRAVENOUS | Status: AC
  Administered 2024-05-06: 2 g via INTRAVENOUS
  Filled 2024-05-06: qty 50

## 2024-05-06 MED ORDER — POTASSIUM CHLORIDE CRYS ER 20 MEQ PO TBCR
40.0000 meq | EXTENDED_RELEASE_TABLET | Freq: Two times a day (BID) | ORAL | Status: AC
  Administered 2024-05-06: 40 meq via ORAL
  Filled 2024-05-06: qty 2

## 2024-05-06 NOTE — Plan of Care (Signed)
" °  Problem: Education: Goal: Ability to describe self-care measures that may prevent or decrease complications (Diabetes Survival Skills Education) will improve Outcome: Progressing   Problem: Coping: Goal: Ability to adjust to condition or change in health will improve Outcome: Progressing   Problem: Nutritional: Goal: Maintenance of adequate nutrition will improve Outcome: Progressing   Problem: Skin Integrity: Goal: Risk for impaired skin integrity will decrease Outcome: Progressing   Problem: Tissue Perfusion: Goal: Adequacy of tissue perfusion will improve Outcome: Progressing   Problem: Education: Goal: Knowledge of General Education information will improve Description: Including pain rating scale, medication(s)/side effects and non-pharmacologic comfort measures Outcome: Progressing   Problem: Activity: Goal: Risk for activity intolerance will decrease Outcome: Progressing   Problem: Nutrition: Goal: Adequate nutrition will be maintained Outcome: Progressing   Problem: Coping: Goal: Level of anxiety will decrease Outcome: Progressing   Problem: Elimination: Goal: Will not experience complications related to bowel motility Outcome: Progressing   Problem: Pain Managment: Goal: General experience of comfort will improve and/or be controlled Outcome: Progressing   Problem: Safety: Goal: Ability to remain free from injury will improve Outcome: Progressing   "

## 2024-05-06 NOTE — Progress Notes (Signed)
 Mobility Specialist - Progress Note    05/06/24 1108  Mobility  Activity Ambulated with assistance  Level of Assistance Standby assist, set-up cues, supervision of patient - no hands on  Assistive Device Front wheel walker  Distance Ambulated (ft) 20 ft  Range of Motion/Exercises Active;All extremities  Activity Response Tolerated well  Mobility Referral Yes  Mobility visit 1 Mobility   Pt resting in bed on RA upon entry. Pt STS and ambulates to bathroom SBA with RW. Pt returned to bed and left with needs in reach. Bed alarm activated patient confused.   Guido Rumble Mobility Specialist 05/06/24, 11:44 AM

## 2024-05-06 NOTE — Progress Notes (Signed)
 PHARMACY CONSULT NOTE - ELECTROLYTES  Pharmacy Consult for Electrolyte Monitoring and Replacement   Recent Labs: Potassium (mmol/L)  Date Value  05/06/2024 3.2 (L)  10/07/2013 3.2 (L)   Magnesium  (mg/dL)  Date Value  97/93/7973 1.6 (L)   Calcium  (mg/dL)  Date Value  97/93/7973 8.9   Calcium , Total (mg/dL)  Date Value  92/89/7984 8.7   Albumin (g/dL)  Date Value  97/95/7973 4.1  10/07/2013 3.6   Phosphorus (mg/dL)  Date Value  97/93/7973 2.7   Sodium (mmol/L)  Date Value  05/06/2024 139  10/07/2013 141   Corrected Ca: 9.4 mg/dL  Height: 5' 1 (845.0 cm) Weight: 63.3 kg (139 lb 8.8 oz) IBW/kg (Calculated) : 47.8 Estimated Creatinine Clearance: 61.4 mL/min (by C-G formula based on SCr of 0.77 mg/dL).  Assessment  Sharon Woods is a 64 y.o. female presenting with UTI. PMH significant for sage IV breast cancer, liver cirrhosis, CKD, DM, HTN, HLD. Pharmacy has been consulted to monitor and replace electrolytes.  2/5 AM Kphos back from last PM was not yet completed upon collection of AM labs.   Diet: regular, heart healthy MIVF: NS @ 75 mL/hr Pertinent medications: pamidronate   Goal of Therapy: Electrolytes within normal limits  Plan:  K = 3.2, give Kcl 40 mEq po BID x 2 doses Mg = 1.6, give MagSulf 2 grams IV x 1 F/U AM labs  Thank you for allowing pharmacy to be a part of this patients care.  Kayla JULIANNA Blew, PharmD Clinical Pharmacist 05/06/2024 7:21 AM

## 2024-05-06 NOTE — Progress Notes (Signed)
 " PROGRESS NOTE    Sharon Woods  FMW:[id number] DOB: 1960/05/17 DOA: 05/04/2024 PCP: Pcp, No  Assessment & Plan:   Principal Problem:   UTI (urinary tract infection) Active Problems:   Electrolyte disturbance   Hypokalemia   Hypomagnesemia   Hypophosphatemia   Hypercalcemia   Chronic kidney disease, stage 3a (HCC)   Type II diabetes mellitus with renal manifestations (HCC)   Thrombocytopenia   Normocytic anemia   Liver cirrhosis (HCC)   Malignant neoplasm of left breast, stage 4 (HCC)   Confusion   Depression with anxiety  Assessment and Plan: UTI: urine cx is growing on gram positive cocci, sens pending. Continue on IV rocephin    Hypokalemia: potassium given   Hypophosphatemia: WNL today  Hypomagnesemia: mg sulfate given    Hypercalcemia: Likely due to stage IV breast cancer. S/p pamidronate . Improving. Holding home dose of os-cal    CKDIIIa: Cr is better than baseline currently. Avoid nephrotoxic meds  DM2: poorly controlled, HbA1c 7.8. Continue on SSI w/ accuchecks     Thrombocytopenia: trending down still. Likely secondary to cirrhosis.. Will continue to monitor  Normocytic anemia: likely secondary to cirrhosis. Will transfuse if Hb < 7.0    Liver cirrhosis: not on lactulose, rifaximin as per med rec. Ammonia is WNL    Stage IV breast cancer: management per onco outpatient. Pt will have to bring in home dose of capibasertib    Acute metabolic encephalopathy: likely secondary to above infection. Continue on IV abxs. Re-orient prn   Depression: severity unknown. Continue on home dose of lexapro        DVT prophylaxis: SCDs Code Status: full  Family Communication: discussed pt's care w/ pt's husband, Francis, and answered his questions  Disposition Plan: likely d/c back home   Level of care: Telemetry  Status is: Inpatient Remains inpatient appropriate because: severity of illness    Consultants:    Procedures:   Antimicrobials:  rocephin   Subjective: Pt c/o side/flank pain.   Objective: Vitals:   05/05/24 2200 05/05/24 2244 05/06/24 0403 05/06/24 0730  BP:  133/76 121/74 128/75  Pulse:  91 89 91  Resp:  18 18 18   Temp:  98.1 F (36.7 C) 98.1 F (36.7 C) 98 F (36.7 C)  TempSrc:  Oral    SpO2:  100% 100% 100%  Weight: 63.4 kg 63.3 kg    Height: 5' 1 (1.549 m)       Intake/Output Summary (Last 24 hours) at 05/06/2024 0931 Last data filed at 05/05/2024 1440 Gross per 24 hour  Intake 999 ml  Output --  Net 999 ml   Filed Weights   05/05/24 2200 05/05/24 2244  Weight: 63.4 kg 63.3 kg    Examination:  General exam: appears uncomfortable  Respiratory system: clear breath sounds b/l  Cardiovascular system: S1/S2+. No rubs or clicks Gastrointestinal system: Abd is soft, NT, ND & hypoactive bowel sounds Central nervous system: Alert and oriented x3 but not situation  Psychiatry: judgement and insight appears poor. Flat mood and affect     Data Reviewed: I have personally reviewed following labs and imaging studies  CBC: Recent Labs  Lab 04/29/24 1516 05/04/24 1629 05/05/24 0506 05/06/24 0416  WBC 7.7 5.1 3.2* 2.8*  NEUTROABS 6.0  --   --   --   HGB 11.7* 10.9* 9.0* 9.4*  HCT 35.8* 33.7* 27.4* 28.4*  MCV 83.1 83.4 83.8 85.3  PLT 124* 101* 73* 66*   Basic Metabolic Panel: Recent Labs  Lab 04/29/24 1517  05/04/24 1333 05/04/24 1628 05/04/24 1629 05/05/24 0506 05/06/24 0416  NA 140 142  --  141 141 139  K 2.9* 2.7*  --  2.8* 3.3* 3.2*  CL 97* 98  --  99 106 105  CO2 24 24  --  25 22 24   GLUCOSE 119* 138*  --  120* 96 121*  BUN 26* 34*  --  33* 25* 19  CREATININE 1.17* 1.51*  --  1.33* 0.96 0.77  CALCIUM  11.6* 12.1*  --  11.3* 9.4 8.9  MG  --   --   --  1.3* 2.0 1.6*  PHOS  --   --  1.6*  --  2.4* 2.7   GFR: Estimated Creatinine Clearance: 61.4 mL/min (by C-G formula based on SCr of 0.77 mg/dL). Liver Function Tests: Recent Labs  Lab 04/29/24 1517 05/04/24 1333  05/04/24 1629  AST 35 28 26  ALT 10 10 8   ALKPHOS 64 59 55  BILITOT 0.6 0.5 0.5  PROT 7.0 7.0 6.6  ALBUMIN 4.2 4.4 4.1   Recent Labs  Lab 05/04/24 1629  LIPASE 51   Recent Labs  Lab 05/05/24 1044  AMMONIA <13   Coagulation Profile: Recent Labs  Lab 05/05/24 2304  INR 1.2   Cardiac Enzymes: No results for input(s): CKTOTAL, CKMB, CKMBINDEX, TROPONINI in the last 168 hours. BNP (last 3 results) No results for input(s): PROBNP in the last 8760 hours. HbA1C: No results for input(s): HGBA1C in the last 72 hours. CBG: Recent Labs  Lab 05/05/24 0750 05/05/24 1134 05/05/24 1645 05/05/24 2155 05/06/24 0732  GLUCAP 111* 106* 101* 96 105*   Lipid Profile: No results for input(s): CHOL, HDL, LDLCALC, TRIG, CHOLHDL, LDLDIRECT in the last 72 hours. Thyroid Function Tests: No results for input(s): TSH, T4TOTAL, FREET4, T3FREE, THYROIDAB in the last 72 hours. Anemia Panel: No results for input(s): VITAMINB12, FOLATE, FERRITIN, TIBC, IRON , RETICCTPCT in the last 72 hours. Sepsis Labs: No results for input(s): PROCALCITON, LATICACIDVEN in the last 168 hours.  No results found for this or any previous visit (from the past 240 hours).       Radiology Studies: CT Head Wo Contrast Result Date: 05/04/2024 CLINICAL DATA:  Altered mental status. EXAM: CT HEAD WITHOUT CONTRAST TECHNIQUE: Contiguous axial images were obtained from the base of the skull through the vertex without intravenous contrast. RADIATION DOSE REDUCTION: This exam was performed according to the departmental dose-optimization program which includes automated exposure control, adjustment of the mA and/or kV according to patient size and/or use of iterative reconstruction technique. COMPARISON:  Head CT 08/28/2015.  Brain MRI 02/14/2023 FINDINGS: Brain: No intracranial hemorrhage, mass effect, or midline shift. No hydrocephalus. The basilar cisterns are patent. No CT  correlate for the T2 hyperintensities on prior MRI. No evidence of territorial infarct or acute ischemia. No extra-axial or intracranial fluid collection. Vascular: No hyperdense vessel or unexpected calcification. Skull: Questionable lucent lesions within the right frontal calvarium series 3, image 33 midline occipital bone series 3, image 60 and right parietal bone series 3, image 67. Sinuses/Orbits: Mucous retention cyst in the left maxillary sinus. No mastoid effusion. Other: No definite scalp soft tissue abnormality. IMPRESSION: 1. No acute intracranial abnormality. 2. Questionable lucent lesions within the calvarium. Per review of electronic records, patient has history of breast neoplasm with osseous metastatic disease. Electronically Signed   By: Andrea Gasman M.D.   On: 05/04/2024 18:18        Scheduled Meds:  escitalopram   20 mg Oral Daily  famotidine   40 mg Oral Daily   insulin  aspart  0-5 Units Subcutaneous QHS   insulin  aspart  0-9 Units Subcutaneous TID WC   [COMPLETED] potassium chloride   40 mEq Oral BID   Continuous Infusions:  sodium chloride  75 mL/hr at 05/05/24 1441   cefTRIAXone  (ROCEPHIN )  IV Stopped (05/05/24 1903)     LOS: 2 days       Anthony CHRISTELLA Pouch, MD [facility] Pager [phone]  If 7PM-7AM, please contact night-coverage [email or web address] 05/06/2024, 9:31 AM   "

## 2024-05-06 NOTE — Progress Notes (Signed)
 Mobility Specialist - Progress Note     05/06/24 1600  Mobility  Activity Stood with assistance;Ambulated with assistance  Level of Assistance Standby assist, set-up cues, supervision of patient - no hands on  Assistive Device Front wheel walker  Distance Ambulated (ft) 10 ft  Range of Motion/Exercises Active  Activity Response Tolerated well  Mobility Referral Yes  Mobility visit 1 Mobility   Pt OOB upon entry on RA. Pt redirected to bed SBA with no AD. Pt left in bed with needs in reach and eating. Bed alarm activated.   Guido Rumble Mobility Specialist 05/06/24, 4:08 PM

## 2024-05-06 NOTE — TOC Initial Note (Signed)
 Transition of Care [facility]) - Initial/Assessment Note    Patient Details  Name: Sharon Woods MRN: [id number] Date of Birth: [birth year]  [facility]) CM/SW Contact:    Grayce JAYSON Perfect, RN Phone Number: 05/06/2024, 2:33 PM  Clinical Narrative:                  RNCM met with patient in her room.  She is eating lunch.  Oriented to self and place only. PT/OT recommends [facility] PT/OT.  Referrals sent to local homecare agencies.  RNCM left message for husband to call me.  She lives with husband and son, does not drive.  She does have a PCP.       Patient Goals and CMS Choice            Expected Discharge Plan and Services                                              Prior Living Arrangements/Services                       Activities of Daily Living   ADL Screening (condition at time of admission) Independently performs ADLs?: Yes (appropriate for developmental age) Is the patient deaf or have difficulty hearing?: No Does the patient have difficulty seeing, even when wearing glasses/contacts?: No Does the patient have difficulty concentrating, remembering, or making decisions?: Yes  Permission Sought/Granted                  Emotional Assessment              Admission diagnosis:  Hypokalemia [E87.6] UTI (urinary tract infection) [N39.0] AKI (acute kidney injury) [N17.9] Urinary tract infection without hematuria, site unspecified [N39.0] Patient Active Problem List   Diagnosis Date Noted   Hypophosphatemia 05/05/2024   UTI (urinary tract infection) 05/04/2024   Electrolyte disturbance 05/04/2024   Hypomagnesemia 05/04/2024   Hypercalcemia 05/04/2024   Chronic kidney disease, stage 3a (HCC) 05/04/2024   Type II diabetes mellitus with renal manifestations (HCC) 05/04/2024   HLD (hyperlipidemia) 05/04/2024   Depression with anxiety 05/04/2024   Normocytic anemia 05/04/2024   Confusion 05/04/2024   Neoplasm related pain 10/21/2023    Diarrhea 10/21/2023   Liver cirrhosis (HCC) 08/26/2023   Ascites 08/26/2023   Mixed hyperlipidemia 04/23/2023   Moderate episode of recurrent major depressive disorder (HCC) 04/23/2023   Abnormal CT scan 07/25/2022   Adenomatous polyp of colon 07/25/2022   Hypokalemia 06/24/2022   Anxiety 05/25/2022   Leukopenia 03/20/2022   Blood in stool 03/13/2022   Abnormal gastrointestinal PET scan 01/10/2022   Diabetes mellitus (HCC) 12/05/2021   Status post bilateral mastectomy 11/21/2021   Genetic testing 10/07/2021   Vaginitis 09/28/2021   Thrombocytopenia 09/08/2021   Malignant neoplasm of left breast, stage 4 (HCC) 08/28/2021   Malignant neoplasm of lower-outer quadrant of right breast of female, estrogen receptor positive (HCC) 08/28/2021   Goals of care, counseling/discussion 08/28/2021   Severe recurrent major depression with psychotic features (HCC) 11/15/2015   Hypertension 11/15/2015   Noncompliance 11/15/2015   Severe major depression, single episode, with psychotic features, mood-congruent (HCC) 03/29/2015   Protein-calorie malnutrition, severe 03/26/2015   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  [place]) [place] [address] Phone: [phone] Fax: [phone]  [facility] - [facility] [address] [place] [address] Phone: [phone] Fax: [phone]     Social Drivers of Health (SDOH) Social History: SDOH Screenings   Food Insecurity: Unknown (05/06/2024)  Housing: Unknown (05/06/2024)  Transportation Needs: Unknown (05/06/2024)  Utilities: Patient Unable To Answer (05/06/2024)  Alcohol Screen: Low Risk (04/23/2023)  Depression (PHQ2-9): Low Risk (05/04/2024)  Financial Resource Strain: Medium Risk (10/05/2023)   Received from [facility]  Physical Activity: Inactive (10/10/2021)  Social Connections: Socially Isolated  (10/10/2021)  Stress: Stress Concern Present (10/10/2021)  Tobacco Use: Medium Risk (05/05/2024)   SDOH Interventions:     Readmission Risk Interventions     No data to display

## 2024-05-06 NOTE — Progress Notes (Signed)
 Patient admitted for UTI and Altered Mental Status, not able to complete patients profile.

## 2024-05-10 ENCOUNTER — Ambulatory Visit: Payer: MEDICAID

## 2024-05-24 ENCOUNTER — Inpatient Hospital Stay: Payer: MEDICAID

## 2024-05-24 ENCOUNTER — Ambulatory Visit: Payer: MEDICAID | Admitting: Internal Medicine

## 2024-05-24 ENCOUNTER — Inpatient Hospital Stay: Payer: MEDICAID | Admitting: Oncology
